# Patient Record
Sex: Male | Born: 1950 | Race: Black or African American | Hispanic: No | Smoking: Never smoker
Health system: Southern US, Community
[De-identification: ages and names within clinical notes are randomized; demographics above are authoritative.]

## PROBLEM LIST (undated history)

## (undated) DIAGNOSIS — I340 Nonrheumatic mitral (valve) insufficiency: Secondary | ICD-10-CM

## (undated) DIAGNOSIS — I219 Acute myocardial infarction, unspecified: Secondary | ICD-10-CM

## (undated) DIAGNOSIS — J302 Other seasonal allergic rhinitis: Secondary | ICD-10-CM

## (undated) DIAGNOSIS — I2510 Atherosclerotic heart disease of native coronary artery without angina pectoris: Secondary | ICD-10-CM

## (undated) DIAGNOSIS — Z87442 Personal history of urinary calculi: Secondary | ICD-10-CM

## (undated) DIAGNOSIS — E119 Type 2 diabetes mellitus without complications: Secondary | ICD-10-CM

## (undated) DIAGNOSIS — N159 Renal tubulo-interstitial disease, unspecified: Secondary | ICD-10-CM

## (undated) DIAGNOSIS — Z992 Dependence on renal dialysis: Secondary | ICD-10-CM

## (undated) DIAGNOSIS — F329 Major depressive disorder, single episode, unspecified: Secondary | ICD-10-CM

## (undated) DIAGNOSIS — F32A Depression, unspecified: Secondary | ICD-10-CM

## (undated) DIAGNOSIS — M1990 Unspecified osteoarthritis, unspecified site: Secondary | ICD-10-CM

## (undated) DIAGNOSIS — I10 Essential (primary) hypertension: Secondary | ICD-10-CM

## (undated) HISTORY — DX: Atherosclerotic heart disease of native coronary artery without angina pectoris: I25.10

## (undated) HISTORY — PX: EYE SURGERY: SHX253

## (undated) HISTORY — PX: COLONOSCOPY W/ POLYPECTOMY: SHX1380

## (undated) HISTORY — PX: SPINE SURGERY: SHX786

## (undated) HISTORY — PX: BACK SURGERY: SHX140

---

## 2004-11-03 ENCOUNTER — Ambulatory Visit (HOSPITAL_COMMUNITY): Admission: RE | Admit: 2004-11-03 | Discharge: 2004-11-04 | Payer: Self-pay | Admitting: Ophthalmology

## 2006-05-31 ENCOUNTER — Ambulatory Visit (HOSPITAL_COMMUNITY): Admission: RE | Admit: 2006-05-31 | Discharge: 2006-06-01 | Payer: Self-pay | Admitting: Ophthalmology

## 2006-06-14 HISTORY — PX: CORONARY ANGIOPLASTY WITH STENT PLACEMENT: SHX49

## 2007-01-15 ENCOUNTER — Emergency Department (HOSPITAL_COMMUNITY): Admission: EM | Admit: 2007-01-15 | Discharge: 2007-01-16 | Payer: Self-pay | Admitting: Emergency Medicine

## 2007-01-16 ENCOUNTER — Emergency Department (HOSPITAL_COMMUNITY): Admission: EM | Admit: 2007-01-16 | Discharge: 2007-01-16 | Payer: Self-pay | Admitting: Emergency Medicine

## 2007-01-18 ENCOUNTER — Inpatient Hospital Stay: Payer: Self-pay | Admitting: Internal Medicine

## 2007-01-20 ENCOUNTER — Ambulatory Visit: Payer: Self-pay | Admitting: Internal Medicine

## 2007-11-04 ENCOUNTER — Emergency Department (HOSPITAL_COMMUNITY): Admission: EM | Admit: 2007-11-04 | Discharge: 2007-11-04 | Payer: Self-pay | Admitting: Emergency Medicine

## 2009-07-13 ENCOUNTER — Emergency Department (HOSPITAL_COMMUNITY): Admission: EM | Admit: 2009-07-13 | Discharge: 2009-07-13 | Payer: Self-pay | Admitting: Emergency Medicine

## 2009-09-02 ENCOUNTER — Ambulatory Visit: Payer: Self-pay | Admitting: Internal Medicine

## 2009-09-02 DIAGNOSIS — E119 Type 2 diabetes mellitus without complications: Secondary | ICD-10-CM | POA: Insufficient documentation

## 2009-09-02 DIAGNOSIS — I2510 Atherosclerotic heart disease of native coronary artery without angina pectoris: Secondary | ICD-10-CM | POA: Insufficient documentation

## 2009-09-02 DIAGNOSIS — R1310 Dysphagia, unspecified: Secondary | ICD-10-CM | POA: Insufficient documentation

## 2009-09-02 DIAGNOSIS — E1121 Type 2 diabetes mellitus with diabetic nephropathy: Secondary | ICD-10-CM | POA: Insufficient documentation

## 2009-09-02 DIAGNOSIS — Z91013 Allergy to seafood: Secondary | ICD-10-CM | POA: Insufficient documentation

## 2009-09-02 DIAGNOSIS — E785 Hyperlipidemia, unspecified: Secondary | ICD-10-CM | POA: Insufficient documentation

## 2009-09-02 DIAGNOSIS — R109 Unspecified abdominal pain: Secondary | ICD-10-CM | POA: Insufficient documentation

## 2009-09-02 DIAGNOSIS — I10 Essential (primary) hypertension: Secondary | ICD-10-CM | POA: Insufficient documentation

## 2009-09-02 DIAGNOSIS — Z87448 Personal history of other diseases of urinary system: Secondary | ICD-10-CM | POA: Insufficient documentation

## 2009-09-02 DIAGNOSIS — K5909 Other constipation: Secondary | ICD-10-CM | POA: Insufficient documentation

## 2009-09-02 DIAGNOSIS — N289 Disorder of kidney and ureter, unspecified: Secondary | ICD-10-CM | POA: Insufficient documentation

## 2009-09-17 ENCOUNTER — Ambulatory Visit (HOSPITAL_COMMUNITY): Admission: RE | Admit: 2009-09-17 | Discharge: 2009-09-17 | Payer: Self-pay | Admitting: Internal Medicine

## 2009-09-17 ENCOUNTER — Ambulatory Visit: Payer: Self-pay | Admitting: Internal Medicine

## 2009-09-19 ENCOUNTER — Encounter: Payer: Self-pay | Admitting: Internal Medicine

## 2010-07-14 NOTE — Letter (Signed)
Summary: Patient Notice, Endo Biopsy Results  [facility]  [address]   [place], [place] [address]   Phone: [phone]  Fax: [phone]       September 19, 2009   [facility] [place], [place]  [address] 08-Jul-1950    Dear [name],  I am pleased to inform you that the biopsies taken during your recent upper endoscopy and colonoscopy did not show any evidence of cancer or inflammation upon pathologic examination.  Additional information/recommendations:  Continue with the treatment plan as outlined on the day of your exam.  You should have a repeat colonoscopy in 7 years.  Please call us if you are having persistent problems or have questions about your condition that have not been fully answered at this time.  Sincerely,    R. Garfield Cornea MD, [facility] Ph: [phone]   Fax: [phone]   Appended Document: Patient Notice, Endo Biopsy Results Letter mailed to pt.

## 2010-07-14 NOTE — Assessment & Plan Note (Signed)
Summary: CONSULT FOR TCS/ABD PAIN/CONSTIPATION/SS   Referring Provider:  Dr Bridget Hartshorn Primary Care Provider:  Dr Bridget Hartshorn  Chief Complaint:  needs tcs, having alot of gas, and bloating and constipation.  History of Present Illness: 60 y/o black male referred for screening colonoscopy, under further questioning he was found to have abd pain & was asked to come in for evaluation prior to procedure.  Pt c/o chronic constipation x 20 yrs.  c/o mid-abd pain intermittant, usually post-prandially, 6/10, lasts until BM.  GB in situ.  BM make abd pain better.  BM 1-2 per week.  Taking fiber supplement with some help.  Denies rectal bleeding or melena.  c/o dark stools x 1 month. Green veggies/beans cause bloating/flatus.  Weight stable.  Appetite ok.  Denies fever or chills,  occ night sweats.  c/o "food gets stuck and won't go down" points to lower esophagus.  Problems w/ liquids and solid foods.  Denies regurgitation.  Denies N/V.   Current Problems (verified): 1)  Dysphagia Unspecified  (ICD-787.20) 2)  Abdominal Pain  (ICD-789.00) 3)  Constipation, Chronic  (ICD-564.09) 4)  Renal Disease, Chronic  (ICD-593.9) 5)  Peyronie's Disease, Hx of  (ICD-V13.09) 6)  Hyperlipidemia  (ICD-272.4) 7)  Cad  (ICD-414.00) 8)  Hypertension  (ICD-401.9) 9)  Iddm  (ICD-250.01) 10)  Shellfish Allergy  (ICD-V15.04)  Current Medications (verified): 1)  Lantus 100 Unit/ml Soln (Insulin Glargine) .... 65 Units Q Am 2)  Glucotrol 10 Mg Tabs (Glipizide) .... Two Times A Day 3)  Clonidine Hcl 0.1 Mg Tabs (Clonidine Hcl) .... Two Times A Day 4)  Toprol Xl 100 Mg Xr24h-Tab (Metoprolol Succinate) .... Two Times A Day 5)  Lipitor 40 Mg Tabs (Atorvastatin Calcium) .... At Bedtime 6)  Accuretic 20-12.5 Mg Tabs (Quinapril-Hydrochlorothiazide) .... Two Times A Day  Allergies (verified): 1)  * Shellfish  Past History:  Past Medical History: DYSPHAGIA UNSPECIFIED (ICD-787.20) ABDOMINAL PAIN (ICD-789.00) CONSTIPATION,  CHRONIC (ICD-564.09) RENAL DISEASE, CHRONIC (ICD-593.9) PEYRONIE'S DISEASE, HX OF (ICD-V13.09) HYPERLIPIDEMIA (ICD-272.4) CAD (ICD-414.00) HYPERTENSION (ICD-401.9) IDDM (ICD-250.01) SHELLFISH ALLERGY (ICD-V15.04)  Past Surgical History: eye surgery hemorrhage low back surgery  Family History: No known family history of colorectal carcinoma, IBD, liver or chronic GI problems. Father: (?) Mother:(72) DM Siblings: 4 brothers-healthy  Social History: married 1 son grown healthy disabled Patient has never smoked.  chew tobacco x 40 yrs, quit 1 mo ago Alcohol Use - none since 10 yrs ago Illicit Drug Use - no Patient does not get regular exercise.  Smoking Status:  never Drug Use:  no Does Patient Exercise:  no  Review of Systems General:  Complains of weakness; denies fever, chills, sweats, anorexia, fatigue, malaise, weight loss, and sleep disorder. CV:  Denies chest pains, angina, palpitations, syncope, dyspnea on exertion, orthopnea, PND, peripheral edema, and claudication. Resp:  Denies dyspnea at rest, dyspnea with exercise, cough, sputum, wheezing, coughing up blood, and pleurisy. GI:  Denies jaundice and fecal incontinence. GU:  Denies urinary burning, blood in urine, urinary frequency, urinary hesitancy, nocturnal urination, and urinary incontinence. Derm:  Denies rash, itching, dry skin, hives, moles, warts, and unhealing ulcers. Psych:  Denies depression, anxiety, memory loss, suicidal ideation, hallucinations, paranoia, phobia, and confusion. Heme:  Complains of bruising; denies bleeding and enlarged lymph nodes.  Vital Signs:  Patient profile:   60 year old male Height:      77 inches Weight:      213 pounds BMI:     25.35 Temp:     98.0 degrees  F oral Pulse rate:   80 / minute BP sitting:   140 / 98  (left arm) Cuff size:   regular  Vitals Entered By: Burnadette Peter LPN (March 22, 624THL 624THL AM)  Physical Exam  General:  Well developed, well nourished, no  acute distress. Head:  Normocephalic and atraumatic. Eyes:  Sclera clear, no icterus. Ears:  Normal auditory acuity. Nose:  No deformity, discharge,  or lesions. Mouth:  poor dentition.   Neck:  Supple; no masses or thyromegaly. Lungs:  Clear throughout to auscultation. Heart:  Regular rate and rhythm; no murmurs, rubs,  or bruits. Abdomen:  Soft, nontender and nondistended. No masses, hepatosplenomegaly or hernias noted. Normal bowel sounds.without guarding and without rebound.   Rectal:  deferred until time of colonoscopy.   Msk:  Symmetrical with no gross deformities. Normal posture. Pulses:  Normal pulses noted. Extremities:  No  cyanosis, edema or deformities noted. POsitive for clubbing. Neurologic:  Alert and  oriented x4;  grossly normal neurologically. Skin:  Intact without significant lesions or rashes. Cervical Nodes:  No significant cervical adenopathy. Psych:  Alert and cooperative. Normal mood and affect.  Impression & Recommendations:  Problem # 1:  CONSTIPATION, CHRONIC (ICD-564.09)  60 y/o black male w/ chronic constipation, unsatisfied w/ BM 1-2 weekly.  Will add miralax to fiber supplement.  Will need screening colonoscopy.  I suspect abdominal pain (generalized) is secondary to constipation since it is relieved w/ defecation.  Colonoscopy and EGD with possible esophageal dilatation to be performed by Dr. Bridgette Habermann in the near future.  I have discussed risks and benefits which include, but are not limited to, bleeding, infection, perforation, or medication reaction.  The patient agrees with this plan and consent will be obtained.  Orders: Consultation Level IV [id number])  Problem # 2:  ABDOMINAL PAIN (ICD-789.00)  See #1  Orders: Consultation Level IV [id number])  Problem # 3:  DYSPHAGIA UNSPECIFIED (ICD-787.20) Chronic intermittant dysphagia to solids/liquids.  ? esophageal web, ring, stricture, less likely malignancy.    Orders: Consultation Level IV  [id number])  Problem # 4:  IDDM (ICD-250.01) Assessment: Comment Only Insulin adjustments made  Patient Instructions: 1)  Take 32 units LANTUS the morning before the procedure 2)  check your blood sugars frequently 3)  Call if any problems 4)  Hold insulin morning of procedure, bring to hospital for early morning procedure.  They will give you instructions there. 5)  Begin miralax as directed for contipation 6)  Constipation literature 7)  The medication list was reviewed and reconciled.  All changed / newly prescribed medications were explained.  A complete medication list was provided to the patient / caregiver.

## 2010-07-14 NOTE — Letter (Signed)
Summary: tcs/egd/ed order  tcs/egd/ed order   Imported By: Sofie Rower 09/02/2009 12:54:02  _____________________________________________________________________  External Attachment:    Type:   Image     Comment:   External Document

## 2010-08-30 LAB — URINE MICROSCOPIC-ADD ON

## 2010-08-30 LAB — BASIC METABOLIC PANEL
BUN: 37 mg/dL — ABNORMAL HIGH (ref 6–23)
CO2: 26 mEq/L (ref 19–32)
Calcium: 8.2 mg/dL — ABNORMAL LOW (ref 8.4–10.5)
Chloride: 97 mEq/L (ref 96–112)
Creatinine, Ser: 2.15 mg/dL — ABNORMAL HIGH (ref 0.4–1.5)
GFR calc Af Amer: 39 mL/min — ABNORMAL LOW (ref >60)
GFR calc non Af Amer: 32 mL/min — ABNORMAL LOW (ref >60)
Glucose, Bld: 208 mg/dL — ABNORMAL HIGH (ref 70–99)
Potassium: 4.1 mEq/L (ref 3.5–5.1)
Sodium: 135 mEq/L (ref 135–145)

## 2010-08-30 LAB — URINALYSIS, ROUTINE W REFLEX MICROSCOPIC
Bilirubin Urine: NEGATIVE
Glucose, UA: 100 mg/dL — AB
Ketones, ur: NEGATIVE mg/dL
Leukocytes, UA: NEGATIVE
Nitrite: NEGATIVE
Protein, ur: >300 mg/dL — AB
Specific Gravity, Urine: 1.02 (ref 1.005–1.030)
Urobilinogen, UA: 0.2 mg/dL (ref 0.0–1.0)
pH: 6 (ref 5.0–8.0)

## 2010-08-30 LAB — HEPATIC FUNCTION PANEL
ALT: 27 U/L (ref 0–53)
AST: 26 U/L (ref 0–37)
Albumin: 2.5 g/dL — ABNORMAL LOW (ref 3.5–5.2)
Alkaline Phosphatase: 87 U/L (ref 39–117)
Bilirubin, Direct: <0.1 mg/dL (ref 0.0–0.3)
Total Bilirubin: 0.3 mg/dL (ref 0.3–1.2)
Total Protein: 6.3 g/dL (ref 6.0–8.3)

## 2010-08-30 LAB — LIPASE, BLOOD: Lipase: 30 U/L (ref 11–59)

## 2010-10-30 NOTE — Op Note (Signed)
NAMEGRAHAME, PEREZMARTINEZ             ACCOUNT NO.:  [id number]   MEDICAL RECORD NO.:  [id number]          PATIENT TYPE:  OIB   LOCATION:  [id number]                         FACILITY:  [facility]   PHYSICIAN:  John D. Zigmund Daniel, M.D. 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   PROCEDURES:  Pars plana vitrectomy with membrane peel, left eye; retinal  photocoagulation, left eye; 25-gauge system, left eye.   SURGEON:  Tempie Hoist, M.D.   ASSISTANT:  Deatra Ina, M.A.   ANESTHESIA:  Is general.   DETAILS:  Usual prep and drape.  Peritomies at 10, 2 and 4 o'clock.  Trocars placed at 10, 2 and 4 o'clock.  Infusion at 4 o'clock.  Provisc  placed on the corneal surface.  Biome viewing system moved into place.  Pars plana vitrectomy was begun just behind the cataractous lens.  Blood  and vitreous were encountered.  This was carefully removed under low  suction and rapid cutting.  A large neovascular frond was seen on the  disk extending nasally.  This was lifted from its attachments to the  retinal surface and trimmed down to the stump at the disk.  Additional  posterior hyaloid was peeled and lifted free from its attachment to the  retinal surface.  The macular region was peeled.  The vitrectomy was  carried out into the periphery where scleral depression was used, and  the blood was removed from the vitreous base for 360 degrees.  Once this  was accomplished, the endolaser was positioned in the eye, and 838 burns  placed around the retinal periphery with a power of 1000 milliwatts,  1000 microns each and 0.1 seconds each.  A washout procedure was  performed.  The instruments were removed from the eye.  Trocars were  removed from the eye.  The wounds were held with Q-tip until  tight.  Polymyxin and gentamicin were irrigated in the tenon space.  Decadron 10  mg was injected into the lower subconjunctival space.  Marcaine was  injected around the globe for postoperative pain.  TobraDex, ophthalmic  ointment, and a patch and shield were placed.  The closing pressure was  15 with a Baer keratometer.  Complications none.  Duration 1/2 hour.  The patient was awakened and taken to recovery in satisfactory  condition.      Chrystie Nose. Zigmund Daniel, M.D.  Electronically Signed     JDM/MEDQ  D:  05/31/2006  T:  06/01/2006  Job:  [id number]

## 2010-10-30 NOTE — Op Note (Signed)
NAMESAVIYON, [name]             ACCOUNT NO.:  [id number]   MEDICAL RECORD NO.:  [id number]          PATIENT TYPE:  OIB   LOCATION:  2550                         FACILITY:  [facility]   PHYSICIAN:  Chrystie Nose. Zigmund Daniel, M.D. DATE OF BIRTH:  [birth year]   DATE OF PROCEDURE:  11/03/2004  DATE OF DISCHARGE:                                 OPERATIVE REPORT   ADMISSION DIAGNOSIS:  Dense vitreous hemorrhage, proliferative diabetic  retinopathy and posterior membranes, right eye.   PROCEDURES:  Pars plana vitrectomy, membrane peel, retinal photocoagulation  in the right eye.   SURGEON:  Tempie Hoist, M.D.   ASSISTANT:  Thurman Coyer. Lundquist, P.A.   ANESTHESIA:  General.   DETAILS:  Usual prep and drape, peritomies at 8, 10 and 2 o'clock, a 4-mm  infusion at 8 o'clock, Provisc placed on the corneal surface.  The  vitrectomy cutter and light pipe were placed at 2 and 10 o'clock,  respectively.  The pars plana vitrectomy was begun just behind the  crystalline lens where large clots of white material were encountered.  The  BIOM viewing system was moved into place.  After piercing through the white  membranes, the dark red blood became apparent.  There were thick layers of  dark red blood lying on the retinal surface and swirling back and forth.  A  washout procedure, washing more and more blood out continually with the  continuous suction and infusion, was carried out until the entire vitreous  cavity was cleaned.  The vitrectomy was carried down to the vitreous base  for 360 degrees.  Scleral depression was needed to access this area and the  vitreous was trimmed down to the vitreous base.  All blood was removed.  A  posterior membrane was extended from the disk nasally; this was trimmed with  vitreous scissors and then the plug was pulled with the vitreous forceps.  Minimal bleeding occurred.  The endolaser was positioned in the eyes, 725  burns placed around the retinal periphery with a power  1000 milliwatts, 1000  microns each and 0.1 seconds each.  Again, a washout procedure was  performed.  Intravitreal Kenalog injection. 0.1 mL, was then injected into  the vitreous cavity.  The instruments were removed from the eye and 9-0  nylon was used to close the sclerotomy sites; they were tested and found to  be tight.  The conjunctiva was closed with wet-field cautery.  Polymyxin and  gentamicin were irrigated into Tenon's space, atropine solution was applied,  Decadron 10 mg was injected to the lower subconjunctival space, Marcaine  solution was injected around the globe for postop pain.  Closing pressure  was 10 with a Barraquer keratometer.  TobraDex ophthalmic ointment, a patch  and shield were placed.  The patient was awakened and taken to recovery in  satisfactory condition.   COMPLICATIONS:  None.   DURATION:  One hour.      JDM/MEDQ  D:  11/03/2004  T:  11/04/2004  Job:  [id number]

## 2011-03-10 LAB — CBC
HCT: 37 — ABNORMAL LOW
Hemoglobin: 13.3
MCHC: 36
MCV: 87.2
Platelets: 191
RBC: 4.24
RDW: 13.4
WBC: 7.1

## 2011-03-10 LAB — DIFFERENTIAL
Basophils Absolute: 0
Basophils Relative: 0
Eosinophils Absolute: 0.2
Eosinophils Relative: 3
Lymphocytes Relative: 32
Lymphs Abs: 2.3
Monocytes Absolute: 0.5
Monocytes Relative: 6
Neutro Abs: 4.1
Neutrophils Relative %: 58

## 2011-03-10 LAB — BASIC METABOLIC PANEL
BUN: 26 — ABNORMAL HIGH
CO2: 24
Calcium: 8.9
Chloride: 107
Creatinine, Ser: 1.92 — ABNORMAL HIGH
GFR calc Af Amer: 44 — ABNORMAL LOW
GFR calc non Af Amer: 36 — ABNORMAL LOW
Glucose, Bld: 110 — ABNORMAL HIGH
Potassium: 3.6
Sodium: 137

## 2011-03-10 LAB — POCT CARDIAC MARKERS
CKMB, poc: 11.9
CKMB, poc: 8.6
Myoglobin, poc: 262
Myoglobin, poc: 414
Operator id: 179121
Operator id: 179121
Troponin i, poc: <0.05
Troponin i, poc: <0.05

## 2011-03-10 LAB — CK TOTAL AND CKMB (NOT AT ARMC)
CK, MB: 20.1 — ABNORMAL HIGH
Relative Index: 2.1
Total CK: 950 — ABNORMAL HIGH

## 2011-03-10 LAB — TROPONIN I: Troponin I: 0.02

## 2011-03-10 LAB — B-NATRIURETIC PEPTIDE (CONVERTED LAB): Pro B Natriuretic peptide (BNP): <30

## 2011-03-29 LAB — BASIC METABOLIC PANEL
BUN: 22
CO2: 27
Calcium: 9
Chloride: 106
Creatinine, Ser: 1.66 — ABNORMAL HIGH
GFR calc Af Amer: 52 — ABNORMAL LOW
GFR calc non Af Amer: 43 — ABNORMAL LOW
Glucose, Bld: 174 — ABNORMAL HIGH
Potassium: 4.1
Sodium: 138

## 2011-03-29 LAB — CBC
HCT: 39.8
Hemoglobin: 13.9
MCHC: 35
MCV: 85.5
Platelets: 264
RBC: 4.65
RDW: 13.9
WBC: 7.8

## 2011-03-29 LAB — DIFFERENTIAL
Basophils Absolute: 0.3 — ABNORMAL HIGH
Basophils Relative: 4 — ABNORMAL HIGH
Eosinophils Absolute: 0.3
Eosinophils Relative: 4
Lymphocytes Relative: 33
Lymphs Abs: 2.5
Monocytes Absolute: 0.6
Monocytes Relative: 8
Neutro Abs: 4
Neutrophils Relative %: 52

## 2011-03-29 LAB — CK TOTAL AND CKMB (NOT AT ARMC)
CK, MB: 23.9 — ABNORMAL HIGH
Relative Index: 3.5 — ABNORMAL HIGH
Total CK: 689 — ABNORMAL HIGH

## 2014-01-14 ENCOUNTER — Emergency Department (HOSPITAL_COMMUNITY): Payer: Medicare Other

## 2014-01-14 ENCOUNTER — Inpatient Hospital Stay (HOSPITAL_COMMUNITY)
Admission: EM | Admit: 2014-01-14 | Discharge: 2014-01-17 | DRG: 683 | Disposition: A | Discharge status: Home / Self Care | Payer: Medicare Other | Attending: Family Medicine | Admitting: Family Medicine

## 2014-01-14 ENCOUNTER — Encounter (HOSPITAL_COMMUNITY): Payer: Self-pay | Admitting: Emergency Medicine

## 2014-01-14 DIAGNOSIS — E8809 Other disorders of plasma-protein metabolism, not elsewhere classified: Secondary | ICD-10-CM | POA: Diagnosis present

## 2014-01-14 DIAGNOSIS — E11319 Type 2 diabetes mellitus with unspecified diabetic retinopathy without macular edema: Secondary | ICD-10-CM | POA: Diagnosis present

## 2014-01-14 DIAGNOSIS — E785 Hyperlipidemia, unspecified: Secondary | ICD-10-CM | POA: Diagnosis present

## 2014-01-14 DIAGNOSIS — E1129 Type 2 diabetes mellitus with other diabetic kidney complication: Secondary | ICD-10-CM | POA: Diagnosis present

## 2014-01-14 DIAGNOSIS — E1121 Type 2 diabetes mellitus with diabetic nephropathy: Secondary | ICD-10-CM | POA: Diagnosis present

## 2014-01-14 DIAGNOSIS — Z87891 Personal history of nicotine dependence: Secondary | ICD-10-CM | POA: Diagnosis not present

## 2014-01-14 DIAGNOSIS — N281 Cyst of kidney, acquired: Secondary | ICD-10-CM | POA: Diagnosis present

## 2014-01-14 DIAGNOSIS — D638 Anemia in other chronic diseases classified elsewhere: Secondary | ICD-10-CM | POA: Diagnosis present

## 2014-01-14 DIAGNOSIS — I2510 Atherosclerotic heart disease of native coronary artery without angina pectoris: Secondary | ICD-10-CM | POA: Diagnosis present

## 2014-01-14 DIAGNOSIS — E119 Type 2 diabetes mellitus without complications: Secondary | ICD-10-CM | POA: Diagnosis present

## 2014-01-14 DIAGNOSIS — Z794 Long term (current) use of insulin: Secondary | ICD-10-CM | POA: Diagnosis not present

## 2014-01-14 DIAGNOSIS — E875 Hyperkalemia: Secondary | ICD-10-CM | POA: Diagnosis present

## 2014-01-14 DIAGNOSIS — N058 Unspecified nephritic syndrome with other morphologic changes: Secondary | ICD-10-CM | POA: Diagnosis present

## 2014-01-14 DIAGNOSIS — E871 Hypo-osmolality and hyponatremia: Secondary | ICD-10-CM | POA: Diagnosis present

## 2014-01-14 DIAGNOSIS — N179 Acute kidney failure, unspecified: Principal | ICD-10-CM | POA: Diagnosis present

## 2014-01-14 DIAGNOSIS — Z23 Encounter for immunization: Secondary | ICD-10-CM

## 2014-01-14 DIAGNOSIS — R05 Cough: Secondary | ICD-10-CM

## 2014-01-14 DIAGNOSIS — I129 Hypertensive chronic kidney disease with stage 1 through stage 4 chronic kidney disease, or unspecified chronic kidney disease: Secondary | ICD-10-CM | POA: Diagnosis present

## 2014-01-14 DIAGNOSIS — N189 Chronic kidney disease, unspecified: Secondary | ICD-10-CM | POA: Diagnosis present

## 2014-01-14 DIAGNOSIS — E1139 Type 2 diabetes mellitus with other diabetic ophthalmic complication: Secondary | ICD-10-CM | POA: Diagnosis present

## 2014-01-14 DIAGNOSIS — I10 Essential (primary) hypertension: Secondary | ICD-10-CM | POA: Diagnosis present

## 2014-01-14 DIAGNOSIS — R059 Cough, unspecified: Secondary | ICD-10-CM

## 2014-01-14 DIAGNOSIS — Z833 Family history of diabetes mellitus: Secondary | ICD-10-CM | POA: Diagnosis not present

## 2014-01-14 DIAGNOSIS — Z9861 Coronary angioplasty status: Secondary | ICD-10-CM

## 2014-01-14 DIAGNOSIS — D631 Anemia in chronic kidney disease: Secondary | ICD-10-CM | POA: Diagnosis present

## 2014-01-14 DIAGNOSIS — R509 Fever, unspecified: Secondary | ICD-10-CM | POA: Diagnosis present

## 2014-01-14 DIAGNOSIS — E109 Type 1 diabetes mellitus without complications: Secondary | ICD-10-CM

## 2014-01-14 HISTORY — DX: Nonrheumatic mitral (valve) insufficiency: I34.0

## 2014-01-14 HISTORY — DX: Renal tubulo-interstitial disease, unspecified: N15.9

## 2014-01-14 HISTORY — DX: Essential (primary) hypertension: I10

## 2014-01-14 HISTORY — DX: Type 2 diabetes mellitus without complications: E11.9

## 2014-01-14 LAB — CBC WITH DIFFERENTIAL/PLATELET
Basophils Absolute: 0 10*3/uL (ref 0.0–0.1)
Basophils Relative: 0 % (ref 0–1)
Eosinophils Absolute: 0.4 10*3/uL (ref 0.0–0.7)
Eosinophils Relative: 5 % (ref 0–5)
HCT: 32.5 % — ABNORMAL LOW (ref 39.0–52.0)
Hemoglobin: 11.4 g/dL — ABNORMAL LOW (ref 13.0–17.0)
Lymphocytes Relative: 17 % (ref 12–46)
Lymphs Abs: 1.3 10*3/uL (ref 0.7–4.0)
MCH: 30.4 pg (ref 26.0–34.0)
MCHC: 35.1 g/dL (ref 30.0–36.0)
MCV: 86.7 fL (ref 78.0–100.0)
Monocytes Absolute: 0.4 10*3/uL (ref 0.1–1.0)
Monocytes Relative: 5 % (ref 3–12)
Neutro Abs: 5.8 10*3/uL (ref 1.7–7.7)
Neutrophils Relative %: 73 % (ref 43–77)
Platelets: 277 10*3/uL (ref 150–400)
RBC: 3.75 MIL/uL — ABNORMAL LOW (ref 4.22–5.81)
RDW: 13 % (ref 11.5–15.5)
WBC: 7.9 10*3/uL (ref 4.0–10.5)

## 2014-01-14 LAB — BASIC METABOLIC PANEL
Anion gap: 10 (ref 5–15)
BUN: 67 mg/dL — ABNORMAL HIGH (ref 6–23)
CO2: 24 mEq/L (ref 19–32)
Calcium: 8.5 mg/dL (ref 8.4–10.5)
Chloride: 96 mEq/L (ref 96–112)
Creatinine, Ser: 6.24 mg/dL — ABNORMAL HIGH (ref 0.50–1.35)
GFR calc Af Amer: 10 mL/min — ABNORMAL LOW (ref >90)
GFR calc non Af Amer: 9 mL/min — ABNORMAL LOW (ref >90)
Glucose, Bld: 236 mg/dL — ABNORMAL HIGH (ref 70–99)
Potassium: 5.6 mEq/L — ABNORMAL HIGH (ref 3.7–5.3)
Sodium: 130 mEq/L — ABNORMAL LOW (ref 137–147)

## 2014-01-14 LAB — PROTEIN / CREATININE RATIO, URINE
Creatinine, Urine: 67.21 mg/dL
Protein Creatinine Ratio: 6.14 — ABNORMAL HIGH (ref 0.00–0.15)
Total Protein, Urine: 413 mg/dL

## 2014-01-14 LAB — CREATININE, URINE, RANDOM: Creatinine, Urine: 69.24 mg/dL

## 2014-01-14 LAB — PHOSPHORUS: Phosphorus: 4.1 mg/dL (ref 2.3–4.6)

## 2014-01-14 LAB — SODIUM, URINE, RANDOM: Sodium, Ur: 48 mEq/L

## 2014-01-14 LAB — MAGNESIUM: Magnesium: 3.5 mg/dL — ABNORMAL HIGH (ref 1.5–2.5)

## 2014-01-14 LAB — GLUCOSE, CAPILLARY: Glucose-Capillary: 77 mg/dL (ref 70–99)

## 2014-01-14 MED ORDER — FLUTICASONE PROPIONATE 50 MCG/ACT NA SUSP
1.0000 | Freq: Every day | NASAL | Status: DC
  Administered 2014-01-14 – 2014-01-17 (×4): 1 via NASAL
  Filled 2014-01-14 (×3): qty 16

## 2014-01-14 MED ORDER — HEPARIN SODIUM (PORCINE) 5000 UNIT/ML IJ SOLN
5000.0000 [IU] | Freq: Three times a day (TID) | INTRAMUSCULAR | Status: DC
Start: 2014-01-14 — End: 2014-01-14

## 2014-01-14 MED ORDER — CLONIDINE HCL 0.2 MG PO TABS
0.2000 mg | ORAL_TABLET | Freq: Three times a day (TID) | ORAL | Status: DC
  Administered 2014-01-14 – 2014-01-17 (×8): 0.2 mg via ORAL
  Filled 2014-01-14 (×8): qty 1

## 2014-01-14 MED ORDER — INSULIN GLARGINE 100 UNIT/ML ~~LOC~~ SOLN
60.0000 [IU] | Freq: Every day | SUBCUTANEOUS | Status: DC
  Administered 2014-01-15: 60 [IU] via SUBCUTANEOUS
  Filled 2014-01-14 (×3): qty 0.6

## 2014-01-14 MED ORDER — SODIUM CHLORIDE 0.9 % IJ SOLN
3.0000 mL | Freq: Two times a day (BID) | INTRAMUSCULAR | Status: DC
  Administered 2014-01-14 – 2014-01-16 (×5): 3 mL via INTRAVENOUS

## 2014-01-14 MED ORDER — INSULIN ASPART 100 UNIT/ML ~~LOC~~ SOLN
0.0000 [IU] | Freq: Three times a day (TID) | SUBCUTANEOUS | Status: DC
  Administered 2014-01-15 (×2): 2 [IU] via SUBCUTANEOUS
  Administered 2014-01-16: 3 [IU] via SUBCUTANEOUS
  Administered 2014-01-16: 2 [IU] via SUBCUTANEOUS

## 2014-01-14 MED ORDER — FENOFIBRATE 160 MG PO TABS
160.0000 mg | ORAL_TABLET | Freq: Every day | ORAL | Status: DC
  Administered 2014-01-15 – 2014-01-17 (×3): 160 mg via ORAL
  Filled 2014-01-14 (×4): qty 1

## 2014-01-14 MED ORDER — ONDANSETRON HCL 4 MG PO TABS: 4.0000 mg | ORAL_TABLET | Freq: Four times a day (QID) | ORAL | Status: DC | PRN

## 2014-01-14 MED ORDER — SODIUM CHLORIDE 0.9 % IV SOLN: INTRAVENOUS | Status: DC

## 2014-01-14 MED ORDER — INSULIN GLARGINE 100 UNIT/ML ~~LOC~~ SOLN
120.0000 [IU] | Freq: Every day | SUBCUTANEOUS | Status: DC
Start: 2014-01-14 — End: 2014-01-14

## 2014-01-14 MED ORDER — CROMOLYN SODIUM 5.2 MG/ACT NA AERS
1.0000 | INHALATION_SPRAY | Freq: Three times a day (TID) | NASAL | Status: DC
  Filled 2014-01-14: qty 13

## 2014-01-14 MED ORDER — HYDRALAZINE HCL 20 MG/ML IJ SOLN: 10.0000 mg | Freq: Four times a day (QID) | INTRAMUSCULAR | Status: DC | PRN

## 2014-01-14 MED ORDER — SODIUM CHLORIDE 0.9 % IV SOLN
INTRAVENOUS | Status: DC
  Administered 2014-01-14 – 2014-01-16 (×5): via INTRAVENOUS

## 2014-01-14 MED ORDER — ONDANSETRON HCL 4 MG/2ML IJ SOLN: 4.0000 mg | Freq: Four times a day (QID) | INTRAMUSCULAR | Status: DC | PRN

## 2014-01-14 MED ORDER — LOSARTAN POTASSIUM 50 MG PO TABS: 100.0000 mg | ORAL_TABLET | Freq: Every day | ORAL | Status: DC

## 2014-01-14 MED ORDER — ACETAMINOPHEN 325 MG PO TABS: 650.0000 mg | ORAL_TABLET | Freq: Four times a day (QID) | ORAL | Status: DC | PRN

## 2014-01-14 MED ORDER — BENZONATATE 100 MG PO CAPS
200.0000 mg | ORAL_CAPSULE | Freq: Once | ORAL | Status: AC
  Administered 2014-01-14: 200 mg via ORAL
  Filled 2014-01-14: qty 2

## 2014-01-14 MED ORDER — CLONIDINE HCL 0.1 MG PO TABS
0.1000 mg | ORAL_TABLET | Freq: Three times a day (TID) | ORAL | Status: DC
  Administered 2014-01-14: 0.1 mg via ORAL
  Filled 2014-01-14: qty 1

## 2014-01-14 MED ORDER — INSULIN ASPART 100 UNIT/ML ~~LOC~~ SOLN: 0.0000 [IU] | Freq: Every day | SUBCUTANEOUS | Status: DC

## 2014-01-14 MED ORDER — ASPIRIN 325 MG PO TABS: 325.0000 mg | ORAL_TABLET | Freq: Every day | ORAL | Status: DC

## 2014-01-14 MED ORDER — FUROSEMIDE 10 MG/ML IJ SOLN
80.0000 mg | Freq: Two times a day (BID) | INTRAMUSCULAR | Status: DC
  Administered 2014-01-14 – 2014-01-15 (×2): 80 mg via INTRAVENOUS
  Filled 2014-01-14 (×2): qty 8

## 2014-01-14 MED ORDER — METOPROLOL SUCCINATE ER 50 MG PO TB24
100.0000 mg | ORAL_TABLET | Freq: Two times a day (BID) | ORAL | Status: DC
  Administered 2014-01-14 – 2014-01-17 (×6): 100 mg via ORAL
  Filled 2014-01-14 (×6): qty 2

## 2014-01-14 MED ORDER — AMLODIPINE BESYLATE 5 MG PO TABS
10.0000 mg | ORAL_TABLET | Freq: Every day | ORAL | Status: DC
  Administered 2014-01-15 – 2014-01-17 (×3): 10 mg via ORAL
  Filled 2014-01-14 (×3): qty 2

## 2014-01-14 MED ORDER — ACETAMINOPHEN 650 MG RE SUPP: 650.0000 mg | Freq: Four times a day (QID) | RECTAL | Status: DC | PRN

## 2014-01-14 NOTE — ED Provider Notes (Signed)
CSN: [id number]     Arrival date & time 01/14/14  1055 History   First MD Initiated Contact with Patient 01/14/14 1136    This chart was scribed for Robbie Lis, MD by Terressa Koyanagi, ED Scribe. This patient was seen in room A340/A340-01 and the patient's care was started at 11:38 AM.  Chief Complaint  Patient presents with  . Cough  . Fever  PCP: CLAGGETT,ELIN, PA-C  HPI HPI Comments: Md Gory is a 63 y.o. male, with a Hx of HTN, DM, kidney infection and MI who presents to the Emergency Department complaining of intermittent cough with associated sneezing, fever, chills, diaphoresis, nasal congestion, emesis (one epsiode), diarrhea, and sore throat onset 3 weeks ago. Patient states "I think I have the flu." No documented fevers at home.  Has not taken anything for his symptoms. Pt denies SOB, chest pain, sick contacts.   Patient noted to be hypertensive. Past Medical History  Diagnosis Date  . Hypertension   . Diabetes mellitus without complication   . Kidney infection   . MI (mitral incompetence)    Past Surgical History  Procedure Laterality Date  . Back surgery    . Coronary angioplasty with stent placement     Family History  Problem Relation Age of Onset  . Diabetes Other    History  Substance Use Topics  . Smoking status: Never Smoker   . Smokeless tobacco: Former Systems developer    Types: [place] date: 06/14/2010  . Alcohol Use: No    Review of Systems  Constitutional: Positive for chills. Negative for fever.  HENT: Positive for congestion.   Respiratory: Positive for cough and shortness of breath. Negative for chest tightness.   Cardiovascular: Negative.  Negative for chest pain.  Gastrointestinal: Positive for vomiting. Negative for nausea and abdominal pain.  Genitourinary: Negative.  Negative for dysuria.  Musculoskeletal: Negative for back pain.  Skin: Negative for rash.  Neurological: Negative for headaches.  All other systems reviewed and are  negative.     Allergies  Review of patient's allergies indicates no known allergies.  Home Medications   Prior to Admission medications   Medication Sig Start Date End Date Taking? Authorizing Provider  amLODipine (NORVASC) 10 MG tablet Take 1 tablet by mouth daily. 01/13/14  Yes Historical Provider, MD  aspirin 325 MG tablet Take 325 mg by mouth daily.   Yes Historical Provider, MD  aspirin-sod bicarb-citric acid (ALKA-SELTZER) 325 MG TBEF tablet Take 325-650 mg by mouth daily as needed (head cold).   Yes Historical Provider, MD  cloNIDine (CATAPRES) 0.1 MG tablet Take 1 tablet by mouth 3 (three) times daily. 11/07/13  Yes Historical Provider, MD  Cromolyn Sodium (NASAL ALLERGY NA) Place 1 spray into both nostrils at bedtime.   Yes Historical Provider, MD  fenofibrate (TRICOR) 145 MG tablet Take 145 mg by mouth daily.   Yes Historical Provider, MD  hydrochlorothiazide (HYDRODIURIL) 25 MG tablet Take 1 tablet by mouth daily. 12/12/13  Yes Historical Provider, MD  LANTUS 100 UNIT/ML injection Inject 120 Units into the skin daily. 60 units on each side of the stomach once daily. 11/28/13  Yes Historical Provider, MD  losartan (COZAAR) 100 MG tablet Take 1 tablet by mouth daily. 12/12/13  Yes Historical Provider, MD  metoprolol succinate (TOPROL-XL) 100 MG 24 hr tablet Take 1 tablet by mouth 2 (two) times daily. 12/12/13  Yes Historical Provider, MD   Triage Vitals: BP 204/102  Pulse 83  Temp(Src) 98.6  F (37 C) (Oral)  Resp 18  Ht 6\' 5"  (1.956 m)  Wt 214 lb (97.07 kg)  BMI 25.37 kg/m2  SpO2 99% Physical Exam  Nursing note and vitals reviewed. Constitutional: He is oriented to person, place, and time. No distress.  HENT:  Head: Normocephalic and atraumatic.  Mouth/Throat: Oropharynx is clear and moist.  Eyes: Pupils are equal, round, and reactive to light.  Neck: Neck supple.  Cardiovascular: Normal rate, regular rhythm and normal heart sounds.   No murmur heard. Pulmonary/Chest: Effort  normal and breath sounds normal. No respiratory distress. He has no wheezes.  Coughing on exam  Abdominal: Soft. Bowel sounds are normal. There is no tenderness. There is no rebound and no guarding.  Musculoskeletal: He exhibits no edema.  Lymphadenopathy:    He has no cervical adenopathy.  Neurological: He is alert and oriented to person, place, and time.  Skin: Skin is warm and dry.  Psychiatric: He has a normal mood and affect.    ED Course  Procedures (including critical care time) DIAGNOSTIC STUDIES: Oxygen Saturation is 99% on RA, nl by my interpretation.    COORDINATION OF CARE:  11:40 AM-Discussed treatment plan which includes imaging with pt at bedside and pt agreed to plan.   Labs Review Labs Reviewed  CBC WITH DIFFERENTIAL - Abnormal; Notable for the following:    RBC 3.75 (*)    Hemoglobin 11.4 (*)    HCT 32.5 (*)    All other components within normal limits  BASIC METABOLIC PANEL - Abnormal; Notable for the following:    Sodium 130 (*)    Potassium 5.6 (*)    Glucose, Bld 236 (*)    BUN 67 (*)    Creatinine, Ser 6.24 (*)    GFR calc non Af Amer 9 (*)    GFR calc Af Amer 10 (*)    All other components within normal limits  CREATININE, URINE, RANDOM  SODIUM, URINE, RANDOM  MAGNESIUM  PHOSPHORUS    Imaging Review Dg Chest 2 View  01/14/2014   CLINICAL DATA:  Flu-like symptoms  EXAM: CHEST  2 VIEW  COMPARISON:  Prior chest x-ray 07/13/2009  FINDINGS: The lungs are clear and negative for focal airspace consolidation, pulmonary edema or suspicious pulmonary nodule. No pleural effusion or pneumothorax. Cardiac and mediastinal contours are within normal limits. Metallic coronary stent projects over the LAD. No acute fracture or lytic or blastic osseous lesions. The visualized upper abdominal bowel gas pattern is unremarkable.  IMPRESSION: No active cardiopulmonary disease.   Electronically Signed   By: Jacqulynn Cadet M.D.   On: 01/14/2014 12:48   US  Renal  01/14/2014   CLINICAL DATA:  Hypertension, flank pain  EXAM: RENAL/URINARY TRACT ULTRASOUND COMPLETE  COMPARISON:  None.  FINDINGS: Right Kidney:  Length: 10.9 cm. Slightly increased echotexture throughout the right kidney. 3.9 cm minimally complex cyst in the upper pole with thin internal septation. 2.2 cm midpole cystic lesion with internal echoes and septations. No hydronephrosis.  Left Kidney:  Length: 11.5 cm. Mildly increased echotexture. 2.2 cm simple appearing cyst in the midpole. No hydronephrosis.  Bladder:  Bladder is incompletely distended. Bladder wall appears mildly thickened which may be related to underdistention. Prostate mildly prominent.  IMPRESSION: Bilateral renal cystic lesions, simple on the left. Minimally complex right upper pole renal cyst with more complex cystic lesion in the midpole. This contains internal septations and debris. This may reflect complex/hemorrhagic cyst, but recommend followup with renal protocol MRI or CT.  Mildly increased echotexture within the kidneys suggesting chronic medical renal disease.  Mildly enlarged prostate. Bladder wall appears mildly thickened although the bladder is not completely filled.   Electronically Signed   By: Rolm Baptise M.D.   On: 01/14/2014 14:35     EKG Interpretation   Date/Time:  Monday January 14 2014 13:54:34 EDT Ventricular Rate:  67 PR Interval:  226 QRS Duration: 158 QT Interval:  430 QTC Calculation: 454 R Axis:   83 Text Interpretation:  Sinus rhythm Prolonged PR interval Right bundle  branch block Similar to prior Confirmed by Aristeo Hankerson  MD, Loma Sousa ([id number]) on  01/14/2014 2:00:07 PM      MDM   Final diagnoses:  Acute renal failure, unspecified acute renal failure type  Cough    Patient presents with multiple complaints and feels he may have the flu. He is nontoxic and exam is reassuring. Vital signs notable for hypertension. Basic labwork obtained. EKG reassuring.  No evidence of leukocytosis. BMP  notable for hyponatremia, creatinine of 6.24 and potassium of 5.6. No widening of the QRS on EKG. Patient reports that he is supposed to followup with her nephrologist in [place] but does not currently have a nephrologist. Given the electrolyte abnormalities, will admit for further evaluation. Nephrology consulted.  I personally performed the services described in this documentation, which was scribed in my presence. The recorded information has been reviewed and is accurate.    Merryl Hacker, MD 01/14/14 [phone]

## 2014-01-14 NOTE — ED Notes (Signed)
Report called to nurse Marcie Bal on 3A and bed assignment was changed to 340.

## 2014-01-14 NOTE — ED Notes (Signed)
Patient c/o cough with "cloudy" thick sputum with fevers x3 weeks. Per patient started as a dry cough. Patient reports"soreness under right rib from coughing."

## 2014-01-14 NOTE — ED Notes (Signed)
Security stated that charge nurse on 3A stated she hasn't assigned a nurse for that room and a nurse would have to call me back once it is assigned.

## 2014-01-14 NOTE — H&P (Signed)
[facility] History and Physical  [name] [id number] DOB: 08/24/1950 DOA: 01/14/2014  Referring physician: ER physician PCP: Geroge Baseman   Chief Complaint: fever, congestion, N/V  HPI:  63 year old male with past medical history of diabetes, hypertension, chronic kidney disease who presented to [facility] ED 01/14/2014 with fevers, chills, cough, congestion ongoing for past few week prior to this admission. Pt also reported having nausea, vomiting and diarrhea. No chest pain, palpitations or shortness of breath. No lightheadedness or loss of consciousness. No reports of blood in stool or urine.  In ED, vitals signs were stable with BP 132/71, HR 67, T max 98.6 F and oxygen saturation 98% on room air. His blood work revealed hemoglobin of 11.4, potassium of 5.6, sodium 130 and new rise in creatinine to 6.24. Renal US showed bilateral renal cystic lesions, simple on the left and minimally complex right upper pole renal cyst with more complex cystic lesion in the midpole which may reflect complex/hemorrhagiccyst. Renal saw the pt in consultation. CXR did not show acute cardiopulmonary findings.   Assessment & Plan    Principal Problem:   Acute on chronic renal failure  Slow increase in creatineine seen over past few years. This new elevation in creatinine is lot worse since last creatinine 4 years ago (2.15).  Appreciate renal consult and recommendations   Pt has received IV fluids in ED.   Per renal lasix 80 mg IV BID started which will help with hyperkalemia.  Renal US showed bilateral renal cystic lesions, simple on the left and minimally complex right upper pole renal cyst with more complex cystic lesion in the midpole which may reflect complex/hemorrhagiccyst.  Check magnesium and phosphorous Active Problems:   Cough, N/V, diarrhea  Possible viral infection  Obtain respiratory viral panel  Supportive care with antiemetics, anti-tussives PRN   IDDM  Restart  lanuts 120 mg daily along with SSI   HYPERLIPIDEMIA  Continue fenofibrate    HYPERTENSION  Hold losartan and Hctz  Restarted clonidine, Norvasc metoprolol 100 mg PO BID and added hydralazine for better BO control    Anemia of chronic disease  Secondary to CKD No current indications for transfusion  Hemoglobin was 11.4 on admission    Hyponatremia / Hyperkalemia  Likely related to acute on chronic renal disease  Added IV fluids per renal  Pt also on lasix 80 mg IV BID for hyperkalemia  DVT prophylaxis: SCD's bilaterally (has possible hemorrhagic renal cyst as evidenced on renal US)  Radiological Exams on Admission: Dg Chest 2 View 01/14/2014    IMPRESSION: No active cardiopulmonary disease.   Electronically Signed   By: Jacqulynn Cadet M.D.   On: 01/14/2014 12:48   US Renal 01/14/2014   IMPRESSION: Bilateral renal cystic lesions, simple on the left. Minimally complex right upper pole renal cyst with more complex cystic lesion in the midpole. This contains internal septations and debris. This may reflect complex/hemorrhagic cyst, but recommend followup with renal protocol MRI or CT.  Mildly increased echotexture within the kidneys suggesting chronic medical renal disease.  Mildly enlarged prostate. Bladder wall appears mildly thickened although the bladder is not completely filled.   Electronically Signed   By: Rolm Baptise M.D.   On: 01/14/2014 14:35    Code Status: Full Family Communication: Plan of care discussed with the patient  Disposition Plan: Admit for further evaluation  Leisa Lenz, MD  [facility] Pager [phone]  Review of Systems:  Constitutional: positive for fever, chills and malaise/fatigue. Negative for  diaphoresis.  HENT: Negative for hearing loss, ear pain, nosebleeds, sore throat, neck pain, tinnitus and ear discharge.   Eyes: Negative for blurred vision, double vision, photophobia, pain, discharge and redness.  Respiratory: positive for cough, no  hemoptysis, sputum production, shortness of breath, wheezing and stridor.   Cardiovascular: Negative for chest pain, palpitations, orthopnea, claudication and leg swelling.  Gastrointestinal: positive for nausea, vomiting and abdominal pain. Negative for heartburn, constipation, blood in stool and melena.  Genitourinary: negative for dysuria, urgency, frequency, hematuria and flank pain.  Musculoskeletal: Negative for myalgias, back pain, joint pain and falls.  Skin: Negative for itching and rash.  Neurological: Negative for dizziness and weakness. Negative for tingling, tremors, sensory change, speech change, focal weakness, loss of consciousness and headaches.  Endo/Heme/Allergies: Negative for environmental allergies and polydipsia. Does not bruise/bleed easily.  Psychiatric/Behavioral: Negative for suicidal ideas. The patient is not nervous/anxious.      Past Medical History  Diagnosis Date  . Hypertension   . Diabetes mellitus without complication   . Kidney infection   . MI (mitral incompetence)    Past Surgical History  Procedure Laterality Date  . Back surgery    . Coronary angioplasty with stent placement     Social History:  reports that he has never smoked. He quit smokeless tobacco use about 3 years ago. His smokeless tobacco use included Chew. He reports that he does not drink alcohol or use illicit drugs.  No Known Allergies  Family History:  Family History  Problem Relation Age of Onset  . Diabetes Other      Prior to Admission medications   Medication Sig Start Date End Date Taking? Authorizing Provider  amLODipine (NORVASC) 10 MG tablet Take 1 tablet by mouth daily. 01/13/14  Yes Historical Provider, MD  aspirin 325 MG tablet Take 325 mg by mouth daily.   Yes Historical Provider, MD  aspirin-sod bicarb-citric acid (ALKA-SELTZER) 325 MG TBEF tablet Take 325-650 mg by mouth daily as needed (head cold).   Yes Historical Provider, MD  cloNIDine (CATAPRES) 0.1 MG  tablet Take 1 tablet by mouth 3 (three) times daily. 11/07/13  Yes Historical Provider, MD  Cromolyn Sodium (NASAL ALLERGY NA) Place 1 spray into both nostrils at bedtime.   Yes Historical Provider, MD  fenofibrate (TRICOR) 145 MG tablet Take 145 mg by mouth daily.   Yes Historical Provider, MD  hydrochlorothiazide (HYDRODIURIL) 25 MG tablet Take 1 tablet by mouth daily. 12/12/13  Yes Historical Provider, MD  LANTUS 100 UNIT/ML injection Inject 120 Units into the skin daily. 60 units on each side of the stomach once daily. 11/28/13  Yes Historical Provider, MD  losartan (COZAAR) 100 MG tablet Take 1 tablet by mouth daily. 12/12/13  Yes Historical Provider, MD  metoprolol succinate (TOPROL-XL) 100 MG 24 hr tablet Take 1 tablet by mouth 2 (two) times daily. 12/12/13  Yes Historical Provider, MD   Physical Exam: Filed Vitals:   01/14/14 1200 01/14/14 1230 01/14/14 1300 01/14/14 1430  BP: 159/93 174/82 132/71 191/103  Pulse: 74 75 68 70  Temp:      TempSrc:      Resp: 20 18 18 15   Height:      Weight:      SpO2: 99% 100% 98% 98%    Physical Exam  Constitutional: Appears well-developed and well-nourished. No distress.  HENT: Normocephalic. No tonsillar erythema or exudates Eyes: Conjunctivae and EOM are normal. PERRLA, no scleral icterus.  Neck: Normal ROM. Neck supple. No JVD. No  tracheal deviation. No thyromegaly.  CVS: RRR, S1/S2 +, no murmurs, no gallops, no carotid bruit.  Pulmonary: Effort and breath sounds normal, no stridor, rhonchi, wheezes, rales.  Abdominal: Soft. BS +,  no distension, tenderness, rebound or guarding.  Musculoskeletal: Normal range of motion. No edema and no tenderness.  Lymphadenopathy: No lymphadenopathy noted, cervical, inguinal. Neuro: Alert. Normal reflexes, muscle tone coordination. No focal neurologic deficits. Skin: Skin is warm and dry. No rash noted. Not diaphoretic. No erythema. No pallor.  Psychiatric: Normal mood and affect. Behavior, judgment, thought  content normal.   Labs on Admission:  Basic Metabolic Panel:  Recent Labs Lab 01/14/14 1219  NA 130*  K 5.6*  CL 96  CO2 24  GLUCOSE 236*  BUN 67*  CREATININE 6.24*  CALCIUM 8.5   Liver Function Tests: No results found for this basename: AST, ALT, ALKPHOS, BILITOT, PROT, ALBUMIN,  in the last 168 hours No results found for this basename: LIPASE, AMYLASE,  in the last 168 hours No results found for this basename: AMMONIA,  in the last 168 hours CBC:  Recent Labs Lab 01/14/14 1219  WBC 7.9  NEUTROABS 5.8  HGB 11.4*  HCT 32.5*  MCV 86.7  PLT 277   Cardiac Enzymes: No results found for this basename: CKTOTAL, CKMB, CKMBINDEX, TROPONINI,  in the last 168 hours BNP: No components found with this basename: POCBNP,  CBG: No results found for this basename: GLUCAP,  in the last 168 hours  If 7PM-7AM, please contact night-coverage [email or web address] Password [facility] 01/14/2014, 2:43 PM

## 2014-01-14 NOTE — ED Notes (Signed)
MD at bedside. 

## 2014-01-14 NOTE — Consult Note (Signed)
Reason for Consult: Worsening of renal failure Referring Physician: Dr. Renaye Rakers [name] is an 63 y.o. male.  HPI: He is a patient who has long-standing history of diabetes, history of poorly controlled hypertension, history of chronic renal failure since 2008 presently came with complaints of cough, fever, chills of about 4 weeks' duration. This is accompanied with history of nausea and one episode of vomiting. Patient also states that he has some diarrhea on and off. Patient denies any difficulty in breathing and no fever or sputum production. Patient was told that he had renal failure and he suggested to him about a year ago that he may require dialysis by [facility]  urology group. Patient however didn't go back to them for another followup. Presently he said [name] feeling better . Patient also has history of diabetic retinopathy and status post surgery was her right eye was very poor vision.  Past Medical History  Diagnosis Date  . Hypertension   . Diabetes mellitus without complication   . Kidney infection   . MI (mitral incompetence)     Past Surgical History  Procedure Laterality Date  . Back surgery    . Coronary angioplasty with stent placement      Family History  Problem Relation Age of Onset  . Diabetes Other     Social History:  reports that he has never smoked. He quit smokeless tobacco use about 3 years ago. His smokeless tobacco use included Chew. He reports that he does not drink alcohol or use illicit drugs.  Allergies: No Known Allergies  Medications: I have reviewed the [name] current medications.  Results for orders placed during the hospital encounter of 01/14/14 (from the past 48 hour([name]))  CBC WITH DIFFERENTIAL     Status: Abnormal   Collection Time    01/14/14 12:19 PM      Result Value Ref Range   WBC 7.9  4.0 - 10.5 K/uL   RBC 3.75 (*) 4.22 - 5.81 MIL/uL   Hemoglobin 11.4 (*) 13.0 - 17.0 g/dL   HCT 32.5 (*) 39.0 - 52.0 %   MCV 86.7  78.0 -  100.0 fL   MCH 30.4  26.0 - 34.0 pg   MCHC 35.1  30.0 - 36.0 g/dL   RDW 13.0  11.5 - 15.5 %   Platelets 277  150 - 400 K/uL   Neutrophils Relative % 73  43 - 77 %   Neutro Abs 5.8  1.7 - 7.7 K/uL   Lymphocytes Relative 17  12 - 46 %   Lymphs Abs 1.3  0.7 - 4.0 K/uL   Monocytes Relative 5  3 - 12 %   Monocytes Absolute 0.4  0.1 - 1.0 K/uL   Eosinophils Relative 5  0 - 5 %   Eosinophils Absolute 0.4  0.0 - 0.7 K/uL   Basophils Relative 0  0 - 1 %   Basophils Absolute 0.0  0.0 - 0.1 K/uL  BASIC METABOLIC PANEL     Status: Abnormal   Collection Time    01/14/14 12:19 PM      Result Value Ref Range   Sodium 130 (*) 137 - 147 mEq/L   Potassium 5.6 (*) 3.7 - 5.3 mEq/L   Chloride 96  96 - 112 mEq/L   CO2 24  19 - 32 mEq/L   Glucose, Bld 236 (*) 70 - 99 mg/dL   BUN 67 (*) 6 - 23 mg/dL   Creatinine, Ser 6.24 (*) 0.50 - 1.35 mg/dL  Calcium 8.5  8.4 - 10.5 mg/dL   GFR calc non Af Amer 9 (*) >90 mL/min   GFR calc Af Amer 10 (*) >90 mL/min   Comment: (NOTE)     The eGFR has been calculated using the CKD EPI equation.     This calculation has not been validated in all clinical situations.     eGFR'[name] persistently <90 mL/min signify possible Chronic Kidney     Disease.   Anion gap 10  5 - 15  CREATININE, URINE, RANDOM     Status: None   Collection Time    01/14/14  1:58 PM      Result Value Ref Range   Creatinine, Urine 69.24    SODIUM, URINE, RANDOM     Status: None   Collection Time    01/14/14  1:58 PM      Result Value Ref Range   Sodium, Ur 48      Dg Chest 2 View  01/14/2014   CLINICAL DATA:  Flu-like symptoms  EXAM: CHEST  2 VIEW  COMPARISON:  Prior chest x-ray 07/13/2009  FINDINGS: The lungs are clear and negative for focal airspace consolidation, pulmonary edema or suspicious pulmonary nodule. No pleural effusion or pneumothorax. Cardiac and mediastinal contours are within normal limits. Metallic coronary stent projects over the LAD. No acute fracture or lytic or blastic  osseous lesions. The visualized upper abdominal bowel gas pattern is unremarkable.  IMPRESSION: No active cardiopulmonary disease.   Electronically Signed   By: Jacqulynn Cadet M.D.   On: 01/14/2014 12:48   US Renal  01/14/2014   CLINICAL DATA:  Hypertension, flank pain  EXAM: RENAL/URINARY TRACT ULTRASOUND COMPLETE  COMPARISON:  None.  FINDINGS: Right Kidney:  Length: 10.9 cm. Slightly increased echotexture throughout the right kidney. 3.9 cm minimally complex cyst in the upper pole with thin internal septation. 2.2 cm midpole cystic lesion with internal echoes and septations. No hydronephrosis.  Left Kidney:  Length: 11.5 cm. Mildly increased echotexture. 2.2 cm simple appearing cyst in the midpole. No hydronephrosis.  Bladder:  Bladder is incompletely distended. Bladder wall appears mildly thickened which may be related to underdistention. Prostate mildly prominent.  IMPRESSION: Bilateral renal cystic lesions, simple on the left. Minimally complex right upper pole renal cyst with more complex cystic lesion in the midpole. This contains internal septations and debris. This may reflect complex/hemorrhagic cyst, but recommend followup with renal protocol MRI or CT.  Mildly increased echotexture within the kidneys suggesting chronic medical renal disease.  Mildly enlarged prostate. Bladder wall appears mildly thickened although the bladder is not completely filled.   Electronically Signed   By: Rolm Baptise M.D.   On: 01/14/2014 14:35    Review of Systems  Constitutional: Positive for chills.  HENT: Positive for congestion.   Eyes: Positive for blurred vision.  Respiratory: Positive for cough. Negative for hemoptysis, sputum production and shortness of breath.   Cardiovascular: Negative for orthopnea and leg swelling.  Gastrointestinal: Positive for nausea, vomiting and diarrhea.  Musculoskeletal: Positive for back pain.  Neurological: Positive for weakness.   Blood pressure 179/91, pulse 71,  temperature 97.8 F (36.6 C), temperature source Oral, resp. rate 16, height '6\' 5"'  (1.956 m), weight 97.07 kg (214 lb), SpO2 100.00%. Physical Exam  Constitutional: He is oriented to person, place, and time. No distress.  Eyes: No scleral icterus.  Neck: No JVD present.  Cardiovascular: Normal rate and regular rhythm.   No murmur heard. Respiratory: No respiratory distress. He has  no wheezes.  GI: He exhibits no distension. There is no tenderness.  Musculoskeletal: He exhibits no edema.  Neurological: He is alert and oriented to person, place, and time.    Assessment/Plan: Problem #1 renal failure: At this moment or sure whether this is acute on chronic or secondary to natural progression of his disease. His creatinine was 1.66 on 01/16/2007, increased to 1.92 on 11/04/2007. And his creatinine was 2.15 on 06/16/2009. 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 Presently hypertensive nephrosclerosis and other etiologies cannot ruled out. Patient presently does not have any uremic sign and symptoms. Problem #2 hyperkalemia: Most likely secondary to chronic renal failure/4 RTA as patient is diabetic/Cozaar. Problem #3 history of diabetes: Long-standing and not controlled very well Problem #4 history of hypertension: Patient states that his blood pressure has been fluctuating up and down hence poorly controlled Problem #5 history of carotid disease/MI Problem #6 history of diabetic retinopathy/bleeding he status post surgery Problem #7 proteinuria: Patient UA from 07/13/2009 showed proteinuria greater than 300 mg/dL. With presence of hypoalbuminemia most likely nephrotic range and possibly from diabetes. Problem #8 anemia Problem #9 metabolic bone disease: Calcium is range but phosphorus is not available. Plan: We'll start patient on normal saline 125 cc per hour We'll use Lasix 80 mg IV twice a day to help [place]  controll his potassium We'll  put patient on low potassium diet We'll hold Cozaar We'll increase clonidine to 0.2 mg by mouth twice a day We'll check his basic metabolic panel, phosphorus, intact PTH in the morning. We'll check urine protein to creatinine ratio. Since patient has been followed by nephrology and possible previous workup presently we'll try to get more information otherwise we'll hold from further additional workup.   [name] 01/14/2014, 4:20 PM

## 2014-01-15 DIAGNOSIS — E109 Type 1 diabetes mellitus without complications: Secondary | ICD-10-CM

## 2014-01-15 DIAGNOSIS — I10 Essential (primary) hypertension: Secondary | ICD-10-CM

## 2014-01-15 DIAGNOSIS — E871 Hypo-osmolality and hyponatremia: Secondary | ICD-10-CM

## 2014-01-15 LAB — COMPREHENSIVE METABOLIC PANEL
ALT: 12 U/L (ref 0–53)
AST: 11 U/L (ref 0–37)
Albumin: 2 g/dL — ABNORMAL LOW (ref 3.5–5.2)
Alkaline Phosphatase: 70 U/L (ref 39–117)
Anion gap: 12 (ref 5–15)
BUN: 66 mg/dL — ABNORMAL HIGH (ref 6–23)
CO2: 24 mEq/L (ref 19–32)
Calcium: 8 mg/dL — ABNORMAL LOW (ref 8.4–10.5)
Chloride: 98 mEq/L (ref 96–112)
Creatinine, Ser: 6.36 mg/dL — ABNORMAL HIGH (ref 0.50–1.35)
GFR calc Af Amer: 10 mL/min — ABNORMAL LOW (ref >90)
GFR calc non Af Amer: 8 mL/min — ABNORMAL LOW (ref >90)
Glucose, Bld: 155 mg/dL — ABNORMAL HIGH (ref 70–99)
Potassium: 4.5 mEq/L (ref 3.7–5.3)
Sodium: 134 mEq/L — ABNORMAL LOW (ref 137–147)
Total Bilirubin: <0.2 mg/dL — ABNORMAL LOW (ref 0.3–1.2)
Total Protein: 5.6 g/dL — ABNORMAL LOW (ref 6.0–8.3)

## 2014-01-15 LAB — GLUCOSE, CAPILLARY
Glucose-Capillary: 128 mg/dL — ABNORMAL HIGH (ref 70–99)
Glucose-Capillary: 88 mg/dL (ref 70–99)
Glucose-Capillary: 124 mg/dL — ABNORMAL HIGH (ref 70–99)

## 2014-01-15 LAB — CBC
HCT: 27 % — ABNORMAL LOW (ref 39.0–52.0)
Hemoglobin: 9.3 g/dL — ABNORMAL LOW (ref 13.0–17.0)
MCH: 30.2 pg (ref 26.0–34.0)
MCHC: 34.4 g/dL (ref 30.0–36.0)
MCV: 87.7 fL (ref 78.0–100.0)
Platelets: 247 10*3/uL (ref 150–400)
RBC: 3.08 MIL/uL — ABNORMAL LOW (ref 4.22–5.81)
RDW: 13.2 % (ref 11.5–15.5)
WBC: 6.6 10*3/uL (ref 4.0–10.5)

## 2014-01-15 LAB — VITAMIN D 25 HYDROXY (VIT D DEFICIENCY, FRACTURES): Vit D, 25-Hydroxy: 18 ng/mL — ABNORMAL LOW (ref 30–89)

## 2014-01-15 LAB — HEMOGLOBIN A1C
Hgb A1c MFr Bld: 9.3 % — ABNORMAL HIGH (ref <5.7)
Mean Plasma Glucose: 220 mg/dL — ABNORMAL HIGH (ref <117)

## 2014-01-15 LAB — PTH, INTACT AND CALCIUM
Calcium, Total (PTH): 8.3 mg/dL — ABNORMAL LOW (ref 8.4–10.5)
PTH: 196.8 pg/mL — ABNORMAL HIGH (ref 14.0–72.0)

## 2014-01-15 LAB — PHOSPHORUS: Phosphorus: 5.1 mg/dL — ABNORMAL HIGH (ref 2.3–4.6)

## 2014-01-15 LAB — HEPATITIS B SURFACE ANTIGEN: Hepatitis B Surface Ag: NEGATIVE

## 2014-01-15 MED ORDER — PNEUMOCOCCAL VAC POLYVALENT 25 MCG/0.5ML IJ INJ
0.5000 mL | INJECTION | INTRAMUSCULAR | Status: AC
  Administered 2014-01-16: 0.5 mL via INTRAMUSCULAR
  Filled 2014-01-15: qty 0.5

## 2014-01-15 MED ORDER — FUROSEMIDE 10 MG/ML IJ SOLN
40.0000 mg | Freq: Two times a day (BID) | INTRAMUSCULAR | Status: DC
  Administered 2014-01-15 – 2014-01-17 (×4): 40 mg via INTRAVENOUS
  Filled 2014-01-15 (×4): qty 4

## 2014-01-15 NOTE — Progress Notes (Signed)
[facility] PROGRESS NOTE  [name] [id number] DOB: [birth year] DOA: 01/14/2014 PCP: Geroge Baseman  Assessment/Plan: 63 year old male with past medical history of diabetes, hypertension, chronic kidney disease who presented to [facility] ED 01/14/2014 with fevers, chills, cough, congestion ongoing for past few week prior to this admission. -admitted with AKI   1. Acute on chronic renal failure  Slow increase in creatineine seen over past few years. This new elevation in creatinine is lot worse since last creatinine 4 years ago (2.15).  Renal US showed bilateral renal cystic lesions, simple on the left and minimally complex right upper pole renal cyst with more complex cystic lesion in the midpole which may reflect complex/hemorrhagiccyst.  Patient is on IVF, prn diuresis per nephrology; Appreciate renal consult and recommendations   2. Cough, N/V, diarrhea  Possible viral infection  Symptoms resolving; Supportive care with antiemetics, anti-tussives PRN 3. IDDM  Restart lanuts 120 mg daily along with SSI, pend HA1c 4. HYPERLIPIDEMIA  Continue fenofibrate  5. HYPERTENSION uncontrolled  Hold losartan and Hctz  Restarted clonidine, Norvasc metoprolol 100 mg PO BID and added hydralazine for better BO control  6. Anemia of chronic disease  Secondary to CKD No current indications for transfusion  F/u iron profile  7. Hyponatremia / Hyperk, resolved; per nephrology   DVT prophylaxis: SCD's bilaterally (has possible hemorrhagic renal cyst as evidenced on renal US)      Code Status: full Family Communication:  D/w patient, his wife (indicate person spoken with, relationship, and if by phone, the number) Disposition Plan: home pend clinical improvement    Consultants:  Nephrology   Procedures:  none  Antibiotics:  none (indicate start date, and stop date if known)  HPI/Subjective: alert  Objective: Filed Vitals:   01/15/14 1021  BP: 128/65  Pulse:   Temp:    Resp:     Intake/Output Summary (Last 24 hours) at 01/15/14 1234 Last data filed at 01/15/14 1027  Gross per 24 hour  Intake 1169.17 ml  Output   1350 ml  Net -180.83 ml   Filed Weights   01/14/14 1106 01/14/14 1536 01/15/14 0533  Weight: 97.07 kg (214 lb) 97.07 kg (214 lb) 97.024 kg (213 lb 14.4 oz)    Exam:   General:  alert  Cardiovascular: s1,s2 rrr  Respiratory: CTA BL  Abdomen: soft, nt,nd   Musculoskeletal: no edema   Data Reviewed: Basic Metabolic Panel:  Recent Labs Lab 01/14/14 1219 01/15/14 0556  NA 130* 134*  K 5.6* 4.5  CL 96 98  CO2 24 24  GLUCOSE 236* 155*  BUN 67* 66*  CREATININE 6.24* 6.36*  CALCIUM 8.5 8.0*  MG 3.5*  --   PHOS 4.1 5.1*   Liver Function Tests:  Recent Labs Lab 01/15/14 0556  AST 11  ALT 12  ALKPHOS 70  BILITOT <0.2*  PROT 5.6*  ALBUMIN 2.0*   No results found for this basename: LIPASE, AMYLASE,  in the last 168 hours No results found for this basename: AMMONIA,  in the last 168 hours CBC:  Recent Labs Lab 01/14/14 1219 01/15/14 0556  WBC 7.9 6.6  NEUTROABS 5.8  --   HGB 11.4* 9.3*  HCT 32.5* 27.0*  MCV 86.7 87.7  PLT 277 247   Cardiac Enzymes: No results found for this basename: CKTOTAL, CKMB, CKMBINDEX, TROPONINI,  in the last 168 hours BNP (last 3 results) No results found for this basename: PROBNP,  in the last 8760 hours CBG:  Recent Labs Lab 01/14/14  2121 01/15/14 0725 01/15/14 1114  GLUCAP 77 128* 88    No results found for this or any previous visit (from the past 240 hour(s)).   Studies: Dg Chest 2 View  01/14/2014   CLINICAL DATA:  Flu-like symptoms  EXAM: CHEST  2 VIEW  COMPARISON:  Prior chest x-ray 07/13/2009  FINDINGS: The lungs are clear and negative for focal airspace consolidation, pulmonary edema or suspicious pulmonary nodule. No pleural effusion or pneumothorax. Cardiac and mediastinal contours are within normal limits. Metallic coronary stent projects over the LAD. No  acute fracture or lytic or blastic osseous lesions. The visualized upper abdominal bowel gas pattern is unremarkable.  IMPRESSION: No active cardiopulmonary disease.   Electronically Signed   By: Jacqulynn Cadet M.D.   On: 01/14/2014 12:48   US Renal  01/14/2014   CLINICAL DATA:  Hypertension, flank pain  EXAM: RENAL/URINARY TRACT ULTRASOUND COMPLETE  COMPARISON:  None.  FINDINGS: Right Kidney:  Length: 10.9 cm. Slightly increased echotexture throughout the right kidney. 3.9 cm minimally complex cyst in the upper pole with thin internal septation. 2.2 cm midpole cystic lesion with internal echoes and septations. No hydronephrosis.  Left Kidney:  Length: 11.5 cm. Mildly increased echotexture. 2.2 cm simple appearing cyst in the midpole. No hydronephrosis.  Bladder:  Bladder is incompletely distended. Bladder wall appears mildly thickened which may be related to underdistention. Prostate mildly prominent.  IMPRESSION: Bilateral renal cystic lesions, simple on the left. Minimally complex right upper pole renal cyst with more complex cystic lesion in the midpole. This contains internal septations and debris. This may reflect complex/hemorrhagic cyst, but recommend followup with renal protocol MRI or CT.  Mildly increased echotexture within the kidneys suggesting chronic medical renal disease.  Mildly enlarged prostate. Bladder wall appears mildly thickened although the bladder is not completely filled.   Electronically Signed   By: Rolm Baptise M.D.   On: 01/14/2014 14:35    Scheduled Meds: . amLODipine  10 mg Oral Daily  . cloNIDine  0.2 mg Oral TID  . fenofibrate  160 mg Oral Daily  . fluticasone  1 spray Each Nare Daily  . furosemide  40 mg Intravenous BID  . insulin aspart  0-15 Units Subcutaneous TID WC  . insulin aspart  0-5 Units Subcutaneous QHS  . insulin glargine  60 Units Subcutaneous Daily   And  . insulin glargine  60 Units Subcutaneous Daily  . metoprolol succinate  100 mg Oral BID  .  sodium chloride  3 mL Intravenous Q12H   Continuous Infusions: . sodium chloride    . sodium chloride 135 mL/hr at 01/15/14 [id number]    Principal Problem:   Acute on chronic renal failure Active Problems:   IDDM   HYPERLIPIDEMIA   HYPERTENSION   CAD   Anemia of chronic disease   Hyponatremia   Hyperkalemia    Time spent: >35 minutes     Kinnie Feil  [facility] Pager [phone]. If 7PM-7AM, please contact night-coverage at [email or web address], password [facility] 01/15/2014, 12:34 PM  LOS: 1 day

## 2014-01-15 NOTE — Progress Notes (Signed)
UR review complete.  

## 2014-01-15 NOTE — Care Management Note (Signed)
    Page 1 of 1   01/17/2014     2:56:26 PM CARE MANAGEMENT NOTE 01/17/2014  Patient:  [name], [name]   Account Number:  [id number]  Date Initiated:  01/15/2014  Documentation initiated by:  Vladimir Creeks  Subjective/Objective Assessment:   Admitted with ARF, DM, hyponatremia. Pt is from with spouse and plans to return home at D/C.     Action/Plan:   No needs identified, but will follow.   Anticipated DC Date:  01/18/2014   Anticipated DC Plan:  [place]  CM consult      Choice offered to / List presented to:             Status of service:  Completed, signed off Medicare Important Message given?  YES (If response is "NO", the following Medicare IM given date fields will be blank) Date Medicare IM given:  01/17/2014 Medicare IM given by:  Theophilus Kinds Date Additional Medicare IM given:   Additional Medicare IM given by:    Discharge Disposition:    Per UR Regulation:  Reviewed for med. necessity/level of care/duration of stay  If discussed at [place] of Stay Meetings, dates discussed:    Comments:  01/17/14 [place], RN BSN CM Pt discharged home. No CM needs noted.  01/15/14 1100 Vladimir Creeks RN/CM

## 2014-01-15 NOTE — Progress Notes (Signed)
Subjective: Interval History: has no complaint of nausea or vomiting. Patient is still that he'll have any more of diarrhea. He denies also any difficulty in breathing..  Objective: Vital signs in last 24 hours: Temp:  [97.8 F (36.6 C)-98.6 F (37 C)] 98 F (36.7 C) (08/04 0533) Pulse Rate:  [64-83] 64 (08/04 0533) Resp:  [13-20] 20 (08/04 0533) BP: (110-204)/(46-103) 110/46 mmHg (08/04 0533) SpO2:  [98 %-100 %] 100 % (08/04 0533) Weight:  [97.024 kg (213 lb 14.4 oz)-97.07 kg (214 lb)] 97.024 kg (213 lb 14.4 oz) (08/04 0533) Weight change:   Intake/Output from previous day: 08/03 0701 - 08/04 0700 In: 929.2 [I.V.:929.2] Out: 900 [Urine:900] Intake/Output this shift:    General appearance: alert, cooperative and no distress Resp: clear to auscultation bilaterally Cardio: regular rate and rhythm, S1, S2 normal, no murmur, click, rub or gallop GI: soft, non-tender; bowel sounds normal; no masses,  no organomegaly Extremities: extremities normal, atraumatic, no cyanosis or edema  Lab Results:  Recent Labs  01/14/14 1219 01/15/14 0556  WBC 7.9 6.6  HGB 11.4* 9.3*  HCT 32.5* 27.0*  PLT 277 247   BMET:  Recent Labs  01/14/14 1219 01/15/14 0556  NA 130* 134*  K 5.6* 4.5  CL 96 98  CO2 24 24  GLUCOSE 236* 155*  BUN 67* 66*  CREATININE 6.24* 6.36*  CALCIUM 8.5 8.0*   No results found for this basename: PTH,  in the last 72 hours Iron Studies: No results found for this basename: IRON, TIBC, TRANSFERRIN, FERRITIN,  in the last 72 hours  Studies/Results: Dg Chest 2 View  01/14/2014   CLINICAL DATA:  Flu-like symptoms  EXAM: CHEST  2 VIEW  COMPARISON:  Prior chest x-ray 07/13/2009  FINDINGS: The lungs are clear and negative for focal airspace consolidation, pulmonary edema or suspicious pulmonary nodule. No pleural effusion or pneumothorax. Cardiac and mediastinal contours are within normal limits. Metallic coronary stent projects over the LAD. No acute fracture or lytic  or blastic osseous lesions. The visualized upper abdominal bowel gas pattern is unremarkable.  IMPRESSION: No active cardiopulmonary disease.   Electronically Signed   By: Jacqulynn Cadet M.D.   On: 01/14/2014 12:48   US Renal  01/14/2014   CLINICAL DATA:  Hypertension, flank pain  EXAM: RENAL/URINARY TRACT ULTRASOUND COMPLETE  COMPARISON:  None.  FINDINGS: Right Kidney:  Length: 10.9 cm. Slightly increased echotexture throughout the right kidney. 3.9 cm minimally complex cyst in the upper pole with thin internal septation. 2.2 cm midpole cystic lesion with internal echoes and septations. No hydronephrosis.  Left Kidney:  Length: 11.5 cm. Mildly increased echotexture. 2.2 cm simple appearing cyst in the midpole. No hydronephrosis.  Bladder:  Bladder is incompletely distended. Bladder wall appears mildly thickened which may be related to underdistention. Prostate mildly prominent.  IMPRESSION: Bilateral renal cystic lesions, simple on the left. Minimally complex right upper pole renal cyst with more complex cystic lesion in the midpole. This contains internal septations and debris. This may reflect complex/hemorrhagic cyst, but recommend followup with renal protocol MRI or CT.  Mildly increased echotexture within the kidneys suggesting chronic medical renal disease.  Mildly enlarged prostate. Bladder wall appears mildly thickened although the bladder is not completely filled.   Electronically Signed   By: Rolm Baptise M.D.   On: 01/14/2014 14:35    I have reviewed the patient's current medications.  Assessment/Plan: Problem #1 renal failure: Presently seems to be chronic . His BUN and creatinine is increasing. Patient  does not have any uremic sign and symptoms. Patient is none oliguric Problem #2 hyperkalemia: His potassium has normalized Problem #3 hypertension: His blood pressure seems reasonably controlled Problem #4 diabetes Problem #5 anemia: His hemoglobin hematocrit has declined. Probably from  hydration. Problem #6 metabolic bone disease: His calcium range however his phosphorus slightly high. Patient is not on any binder. His vitamin D level is low at [facility] is pending. Problem #7 proteinuria: Nephrotic range and most likely secondary to his diabetes Plan: We'll increase his IV fluid to 135 cc per hour We'll decrease Lasix to 40 mg IV twice a day We'll check his basic metabolic panel in the morning. Would also iron studies. If his renal function continued to tolerate have discussed with him about putting an access and starting dialysis. If however his renal function will stabilize possibly we'll follow him as an outpatient and make a decision accordingly.       LOS: 1 day   [name] S 01/15/2014,9:09 AM

## 2014-01-16 DIAGNOSIS — N179 Acute kidney failure, unspecified: Secondary | ICD-10-CM | POA: Diagnosis not present

## 2014-01-16 LAB — GLUCOSE, CAPILLARY
Glucose-Capillary: 76 mg/dL (ref 70–99)
Glucose-Capillary: 156 mg/dL — ABNORMAL HIGH (ref 70–99)
Glucose-Capillary: 122 mg/dL — ABNORMAL HIGH (ref 70–99)
Glucose-Capillary: 264 mg/dL — ABNORMAL HIGH (ref 70–99)

## 2014-01-16 LAB — IRON AND TIBC
Iron: 51 ug/dL (ref 42–135)
Saturation Ratios: 25 % (ref 20–55)
TIBC: 204 ug/dL — ABNORMAL LOW (ref 215–435)
UIBC: 153 ug/dL (ref 125–400)

## 2014-01-16 LAB — BASIC METABOLIC PANEL
Anion gap: 11 (ref 5–15)
BUN: 61 mg/dL — ABNORMAL HIGH (ref 6–23)
CO2: 24 mEq/L (ref 19–32)
Calcium: 8.3 mg/dL — ABNORMAL LOW (ref 8.4–10.5)
Chloride: 101 mEq/L (ref 96–112)
Creatinine, Ser: 6.55 mg/dL — ABNORMAL HIGH (ref 0.50–1.35)
GFR calc Af Amer: 9 mL/min — ABNORMAL LOW (ref >90)
GFR calc non Af Amer: 8 mL/min — ABNORMAL LOW (ref >90)
Glucose, Bld: 104 mg/dL — ABNORMAL HIGH (ref 70–99)
Potassium: 4.8 mEq/L (ref 3.7–5.3)
Sodium: 136 mEq/L — ABNORMAL LOW (ref 137–147)

## 2014-01-16 LAB — CBC
HCT: 30.1 % — ABNORMAL LOW (ref 39.0–52.0)
Hemoglobin: 10.3 g/dL — ABNORMAL LOW (ref 13.0–17.0)
MCH: 30 pg (ref 26.0–34.0)
MCHC: 34.2 g/dL (ref 30.0–36.0)
MCV: 87.8 fL (ref 78.0–100.0)
Platelets: 252 10*3/uL (ref 150–400)
RBC: 3.43 MIL/uL — ABNORMAL LOW (ref 4.22–5.81)
RDW: 13.1 % (ref 11.5–15.5)
WBC: 7.7 10*3/uL (ref 4.0–10.5)

## 2014-01-16 LAB — FERRITIN: Ferritin: 272 ng/mL (ref 22–322)

## 2014-01-16 MED ORDER — VITAMIN D 1000 UNITS PO TABS
1000.0000 [IU] | ORAL_TABLET | Freq: Every day | ORAL | Status: DC
  Administered 2014-01-16 – 2014-01-17 (×2): 1000 [IU] via ORAL
  Filled 2014-01-16 (×2): qty 1

## 2014-01-16 MED ORDER — INSULIN GLARGINE 100 UNIT/ML ~~LOC~~ SOLN
100.0000 [IU] | Freq: Every day | SUBCUTANEOUS | Status: DC
  Administered 2014-01-16: 100 [IU] via SUBCUTANEOUS
  Filled 2014-01-16 (×2): qty 1

## 2014-01-16 MED ORDER — INSULIN GLARGINE 100 UNIT/ML ~~LOC~~ SOLN: 50.0000 [IU] | Freq: Every day | SUBCUTANEOUS | Status: DC

## 2014-01-16 MED ORDER — CALCIUM ACETATE 667 MG PO CAPS
667.0000 mg | ORAL_CAPSULE | Freq: Every day | ORAL | Status: DC
  Administered 2014-01-17: 667 mg via ORAL
  Filled 2014-01-16: qty 1

## 2014-01-16 NOTE — Progress Notes (Signed)
[facility] PROGRESS NOTE  [name] [id number] DOB: 25-Mar-1951 DOA: 01/14/2014 PCP: [name]  Assessment/Plan: 1. Acute on chronic renal failure  Slowly rising creatineine seen over past few years. Not oliguric Renal US showed bilateral renal cystic lesions, simple on the left and minimally complex right upper pole renal cyst with more complex cystic lesion in the midpole which may reflect complex/hemorrhagiccyst.  Patient cont on 135cc/hr IVF, 40mg  BID lasix per nephrology Follow renal function closely - may need HD if renal function continues to decompensate, otherwise Nephro recs for outp follow up if renal function improves 2. Cough, N/V, diarrhea  Suspected viral infection  Symptoms resolving Cont supportive care with antiemetics, anti-tussives PRN 3. IDDM  On 100units lantus daily along with SSI, pend HA1c 4. HYPERLIPIDEMIA  Continue fenofibrate  5. HYPERTENSION uncontrolled  Hold losartan and Hctz  Resumed clonidine, Norvasc metoprolol 100 mg PO BID and added hydralazine for better BO control  6. Anemia of chronic disease  Secondary to CKD No current indications for transfusion  Iron w/in normal limits 7. Hyponatremia / Hyperk, resolved; per nephrology  Code Status: Full Family Communication: Pt in room, family at bedside Disposition Plan: Pending   Consultants:  Nephrology  Procedures:    Antibiotics:     HPI/Subjective: Pt eager to go home. Reports voiding well. Reports feeling "gassy," denies constipation.  Objective: Filed Vitals:   01/15/14 1432 01/15/14 2231 01/16/14 0500 01/16/14 1425  BP: 153/75 170/68 149/77 169/76  Pulse: 66 64 65 66  Temp: 98.3 F (36.8 C) 97.8 F (36.6 C) 97.7 F (36.5 C) 97.4 F (36.3 C)  TempSrc: Oral Oral  Oral  Resp: 20 20  20   Height:      Weight:   97.793 kg (215 lb 9.5 oz)   SpO2: 100% 100% 100% 100%    Intake/Output Summary (Last 24 hours) at 01/16/14 1535 Last data filed at  01/16/14 1426  Gross per 24 hour  Intake 4740.83 ml  Output      0 ml  Net 4740.83 ml   Filed Weights   01/14/14 1536 01/15/14 0533 01/16/14 0500  Weight: 97.07 kg (214 lb) 97.024 kg (213 lb 14.4 oz) 97.793 kg (215 lb 9.5 oz)    Exam:   General:  Awake, in nad  Cardiovascular: regular, s1, s2  Respiratory: normal resp effort, no wheezing  Abdomen: soft,nondistended  Musculoskeletal: perfused, no clubbing   Data Reviewed: Basic Metabolic Panel:  Recent Labs Lab 01/14/14 1219 01/14/14 1708 01/15/14 0556 01/16/14 0625  NA 130*  --  134* 136*  K 5.6*  --  4.5 4.8  CL 96  --  98 101  CO2 24  --  24 24  GLUCOSE 236*  --  155* 104*  BUN 67*  --  66* 61*  CREATININE 6.24*  --  6.36* 6.55*  CALCIUM 8.5 8.3* 8.0* 8.3*  MG 3.5*  --   --   --   PHOS 4.1  --  5.1*  --    Liver Function Tests:  Recent Labs Lab 01/15/14 0556  AST 11  ALT 12  ALKPHOS 70  BILITOT <0.2*  PROT 5.6*  ALBUMIN 2.0*   No results found for this basename: LIPASE, AMYLASE,  in the last 168 hours No results found for this basename: AMMONIA,  in the last 168 hours CBC:  Recent Labs Lab 01/14/14 1219 01/15/14 0556 01/16/14 0625  WBC 7.9 6.6 7.7  NEUTROABS 5.8  --   --   HGB  11.4* 9.3* 10.3*  HCT 32.5* 27.0* 30.1*  MCV 86.7 87.7 87.8  PLT 277 247 252   Cardiac Enzymes: No results found for this basename: CKTOTAL, CKMB, CKMBINDEX, TROPONINI,  in the last 168 hours BNP (last 3 results) No results found for this basename: PROBNP,  in the last 8760 hours CBG:  Recent Labs Lab 01/15/14 0725 01/15/14 1114 01/15/14 1620 01/16/14 0754 01/16/14 1143  GLUCAP 128* 88 124* 76 156*    No results found for this or any previous visit (from the past 240 hour(s)).   Studies: No results found.  Scheduled Meds: . amLODipine  10 mg Oral Daily  . cloNIDine  0.2 mg Oral TID  . fenofibrate  160 mg Oral Daily  . fluticasone  1 spray Each Nare Daily  . furosemide  40 mg Intravenous BID  .  insulin aspart  0-15 Units Subcutaneous TID WC  . insulin aspart  0-5 Units Subcutaneous QHS  . insulin glargine  100 Units Subcutaneous Daily  . metoprolol succinate  100 mg Oral BID  . sodium chloride  3 mL Intravenous Q12H   Continuous Infusions: . sodium chloride    . sodium chloride 135 mL/hr at 01/16/14 [id number]    Principal Problem:   Acute on chronic renal failure Active Problems:   IDDM   HYPERLIPIDEMIA   HYPERTENSION   CAD   Anemia of chronic disease   Hyponatremia   Hyperkalemia  Time spent: 3min  [name], [facility] Pager [phone]. If 7PM-7AM, please contact night-coverage at [email or web address], password [facility] 01/16/2014, 3:35 PM  LOS: 2 days

## 2014-01-16 NOTE — Progress Notes (Signed)
[name]  MRN: [id number]  DOB/AGE: [birth year] 62 y.o.  Primary Care Physician:CLAGGETT,ELIN, PA-C  Admit date: 01/14/2014  Chief Complaint:  Chief Complaint  Patient presents with  . Cough  . Fever    S-Pt presented on  01/14/2014 with  Chief Complaint  Patient presents with  . Cough  . Fever  .    Pt today feels better  Meds . amLODipine  10 mg Oral Daily  . cloNIDine  0.2 mg Oral TID  . fenofibrate  160 mg Oral Daily  . fluticasone  1 spray Each Nare Daily  . furosemide  40 mg Intravenous BID  . insulin aspart  0-15 Units Subcutaneous TID WC  . insulin aspart  0-5 Units Subcutaneous QHS  . insulin glargine  60 Units Subcutaneous Daily   And  . insulin glargine  60 Units Subcutaneous Daily  . metoprolol succinate  100 mg Oral BID  . pneumococcal 23 valent vaccine  0.5 mL Intramuscular Tomorrow-1000  . sodium chloride  3 mL Intravenous Q12H       Physical Exam: Vital signs in last 24 hours: Temp:  [97.7 F (36.5 C)-98.3 F (36.8 C)] 97.7 F (36.5 C) (08/05 0500) Pulse Rate:  [64-66] 65 (08/05 0500) Resp:  [20] 20 (08/04 2231) BP: (128-170)/(65-77) 149/77 mmHg (08/05 0500) SpO2:  [100 %] 100 % (08/05 0500) Weight:  [215 lb 9.5 oz (97.793 kg)] 215 lb 9.5 oz (97.793 kg) (08/05 0500) Weight change: 1 lb 9.5 oz (0.723 kg) Last BM Date: 01/14/14  Intake/Output from previous day: 08/04 0701 - 08/05 0700 In: 4410.8 [P.O.:390; I.V.:4020.8] Out: 450 [Urine:450]     Physical Exam: General- pt is awake,alert, oriented to time place and person Resp- No acute REsp distress, CTA B/L NO Rhonchi CVS- S1S2 regular in rate and rhythm GIT- BS+, soft, NT, ND EXT- NO LE Edema, Cyanosis   Lab Results: CBC  Recent Labs  01/15/14 0556 01/16/14 0625  WBC 6.6 7.7  HGB 9.3* 10.3*  HCT 27.0* 30.1*  PLT 247 252    BMET  Recent Labs  01/15/14 0556 01/16/14 0625  NA 134* 136*  K 4.5 4.8  CL 98 101  CO2 24 24  GLUCOSE 155* 104*  BUN 66* 61*   CREATININE 6.36* 6.55*  CALCIUM 8.0* 8.3*   Creat trend 2015 6.24=> 6.36=>6.55 2009 1.92 2008 1.66   Lab Results  Component Value Date   PTH 196.8* 01/14/2014   CALCIUM 8.3* 01/16/2014   PHOS 5.1* 01/15/2014               Impression: 1)Renal  AKI secondary to ATN/Prerenal vs CKD progression               AKI on CKD               CKD stage 4.               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 Secondary Hyperparathyroidism  present . Phosphorus at goal. Vitamin 25-OH low.  5)DM Primary MD following  6)Electrolytes  Hyperkalemic Better  Hyponatremic better  7)Acid base Co2 at goal     Plan:  Will start vitamin d replacement will start binders once dail with dinner  Educated pt about worseing GFr and possible need of renal replacement therapy soon.       Yorel Redder S 01/16/2014, 10:19 AM

## 2014-01-17 LAB — RESPIRATORY VIRUS PANEL
Adenovirus: NOT DETECTED
Influenza A H1: NOT DETECTED
Influenza A H3: NOT DETECTED
Influenza A: NOT DETECTED
Influenza B: NOT DETECTED
Metapneumovirus: NOT DETECTED
Parainfluenza 1: NOT DETECTED
Parainfluenza 2: NOT DETECTED
Parainfluenza 3: NOT DETECTED
Respiratory Syncytial Virus A: NOT DETECTED
Respiratory Syncytial Virus B: NOT DETECTED
Rhinovirus: NOT DETECTED

## 2014-01-17 LAB — GLUCOSE, CAPILLARY
Glucose-Capillary: 57 mg/dL — ABNORMAL LOW (ref 70–99)
Glucose-Capillary: 72 mg/dL (ref 70–99)
Glucose-Capillary: 118 mg/dL — ABNORMAL HIGH (ref 70–99)

## 2014-01-17 LAB — BASIC METABOLIC PANEL
Anion gap: 10 (ref 5–15)
BUN: 55 mg/dL — ABNORMAL HIGH (ref 6–23)
CO2: 25 mEq/L (ref 19–32)
Calcium: 8.4 mg/dL (ref 8.4–10.5)
Chloride: 103 mEq/L (ref 96–112)
Creatinine, Ser: 6.03 mg/dL — ABNORMAL HIGH (ref 0.50–1.35)
GFR calc Af Amer: 10 mL/min — ABNORMAL LOW (ref >90)
GFR calc non Af Amer: 9 mL/min — ABNORMAL LOW (ref >90)
Glucose, Bld: 67 mg/dL — ABNORMAL LOW (ref 70–99)
Potassium: 4 mEq/L (ref 3.7–5.3)
Sodium: 138 mEq/L (ref 137–147)

## 2014-01-17 MED ORDER — INSULIN GLARGINE 100 UNIT/ML ~~LOC~~ SOLN
80.0000 [IU] | Freq: Every day | SUBCUTANEOUS | Status: DC
  Administered 2014-01-17: 80 [IU] via SUBCUTANEOUS
  Filled 2014-01-17 (×2): qty 0.8

## 2014-01-17 MED ORDER — FUROSEMIDE 20 MG PO TABS: 60.0000 mg | ORAL_TABLET | Freq: Every day | ORAL | Status: DC

## 2014-01-17 MED ORDER — CHOLECALCIFEROL 25 MCG (1000 UT) PO TABS: 1000.0000 [IU] | ORAL_TABLET | Freq: Every day | ORAL | Status: DC

## 2014-01-17 MED ORDER — CALCIUM ACETATE 667 MG PO CAPS: 667.0000 mg | ORAL_CAPSULE | Freq: Every day | ORAL | Status: DC

## 2014-01-17 MED ORDER — INSULIN GLARGINE 100 UNIT/ML ~~LOC~~ SOLN: 80.0000 [IU] | Freq: Every day | SUBCUTANEOUS | Status: DC

## 2014-01-17 MED ORDER — GLUCOSE 40 % PO GEL
ORAL | Status: AC
  Administered 2014-01-17: 37.5 g
  Filled 2014-01-17: qty 1

## 2014-01-17 NOTE — Progress Notes (Signed)
Subjective: Interval History: Patient offers no complaint. Denies any difficulty in breathing  Objective: Vital signs in last 24 hours: Temp:  [97.4 F (36.3 C)-97.9 F (36.6 C)] 97.9 F (36.6 C) (08/06 0610) Pulse Rate:  [66-72] 70 (08/06 0610) Resp:  [20] 20 (08/06 0610) BP: (155-169)/(70-82) 157/70 mmHg (08/06 0610) SpO2:  [100 %] 100 % (08/06 0610) Weight:  [97.523 kg (215 lb)] 97.523 kg (215 lb) (08/06 0610) Weight change: -0.27 kg (-9.5 oz)  Intake/Output from previous day: 08/05 0701 - 08/06 0700 In: 960 [P.O.:960] Out: 800 [Urine:800] Intake/Output this shift: Total I/O In: 240 [P.O.:240] Out: -   General appearance: alert, cooperative and no distress Resp: clear to auscultation bilaterally Cardio: regular rate and rhythm, S1, S2 normal, no murmur, click, rub or gallop GI: soft, non-tender; bowel sounds normal; no masses,  no organomegaly Extremities: extremities normal, atraumatic, no cyanosis or edema  Lab Results:  Recent Labs  01/15/14 0556 01/16/14 0625  WBC 6.6 7.7  HGB 9.3* 10.3*  HCT 27.0* 30.1*  PLT 247 252   BMET:   Recent Labs  01/16/14 0625 01/17/14 0602  NA 136* 138  K 4.8 4.0  CL 101 103  CO2 24 25  GLUCOSE 104* 67*  BUN 61* 55*  CREATININE 6.55* 6.03*  CALCIUM 8.3* 8.4    Recent Labs  01/14/14 1708  PTH 196.8*   Iron Studies:   Recent Labs  01/16/14 0625  IRON 51  TIBC 204*  FERRITIN 272    Studies/Results: No results found.  I have reviewed the patient's current medications.  Assessment/Plan: Problem #1 renal failure: Presently seems to be chronic . His BUN and creatinine is improving. Patient does not have any uremic sign and symptoms. Patient is none oliguric Problem #2 hyperkalemia: His potassium has normalized Problem #3 hypertension: His blood pressure seems reasonably controlled Problem #4 diabetes Problem #5 anemia: His hemoglobin hematocrit is stable. His iron saturation and ferritin is normal  hence  anemia of chronic diseases Problem #6 metabolic bone disease: His calcium range however his phosphorus slightly high. Patient is started on binder. His PTH is also high Problem #7 proteinuria: Nephrotic range and most likely secondary to his diabetes Plan: we will check his Basic metabolic panel on Monday as out patient and make a decision about acces placement and dialysis We will d/c lasix We will continue with ivf if patient is going to be here .        LOS: 3 days   Raghad Lorenz S 01/17/2014,10:29 AM

## 2014-01-17 NOTE — Progress Notes (Signed)
Inpatient Diabetes Program Recommendations  AACE/ADA: New Consensus Statement on Inpatient Glycemic Control (2013)  Target Ranges:  Prepandial:   less than 140 mg/dL      Peak postprandial:   less than 180 mg/dL (1-2 hours)      Critically ill patients:  140 - 180 mg/dL   Results for [name], [name] (MRN [id number]) as of 01/17/2014 08:25  Ref. Range 01/16/2014 07:54 01/16/2014 11:43 01/16/2014 16:59 01/16/2014 21:09 01/17/2014 08:04  Glucose-Capillary Latest Range: 70-99 mg/dL 76 156 (H) 122 (H) 264 (H) 57 (L)   Diabetes history: DM2 Outpatient Diabetes medications: Lantus 120 units daily Current orders for Inpatient glycemic control: Lantus 100 units daily, Novolog 0-15 units AC, Novolog 0-5 units HS  Inpatient Diabetes Program Recommendations Insulin - Basal: Please consider decreasing Lantus to 85 units daily.  Thanks, Barnie Alderman, RN, MSN, CCRN Diabetes Coordinator Inpatient Diabetes Program [phone] (Team Pager) [phone] ([facility] office) [phone] [facility] office)

## 2014-01-17 NOTE — Progress Notes (Signed)
Discharged home with instructions given on medications,and follow up visits,patient verbalized understanding. Prescriptions sent with patient.No c/o pain or discomfort noted.Accompanied by staff to awaiting vehicle.

## 2014-01-17 NOTE — Progress Notes (Signed)
PROGRESS NOTE  [name] [id number] DOB: [birth year] DOA: 01/14/2014 PCP: Geroge Baseman  Summary: 63 year old man with history of diabetes mellitus, hypertension presented with intermittent cough, fever, chills, sore throat for 3 weeks. Initial evaluation revealed acute renal failure with creatinine 6.24, renal ultrasound revealed bilateral renal cystic lesions. He was seen in consultation with nephrology and treated with diuretics and management of blood pressure with gradual improvement in creatinine.  Assessment/Plan: 1. 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 Somewhat improved today, nonoliguric . Losartan and hydrochlorothiazide discontinued. In the past he had been told he had renal failure and dialysis may be required. However he did not followup in [place]. 2. Cough, nausea vomiting, diarrhea. Completely resolved. Possibly viral.  3. 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 Hypoglycemia this morning. 4. HTN. Discontined losartan and hydrochlorothiazide.  5. Anemia, likely of chronic disease, likely secondary to kidney disease.  6. Hyponatremia, hyperkalemia. Resolved. Likely secondary to acute renal failure.  7. Bilateral renal cystic lesions, consider complex cystic lesion right kidney, consider followup MRI renal protocol or CT as an outpatient. Chronic medical renal disease seen on ultrasound.   Kidney function somewhat improved, nonoliguric. Discussed in detail nephrologist he recommends discharge home with outpatient followup. Plan for laboratory studies 8/17 with decisions at that time in regard to access long-term in followup in the office thereafter, this will be coordinated by the nephrologist.  Lasix 60 mg PO daily; continue Phoslo and Vitamin D.  Decrease Lantus to 80 units daily. Patient follows blood sugars closely, discussed rationale for  decreased insulin and the possible need to titrate down further. Wife understands well, present at bedside.  Murray Hodgkins, MD  [facility]  Pager [phone] If 7PM-7AM, please contact night-coverage at [email or web address], password [facility] 01/17/2014, 11:21 AM  LOS: 3 days   Consultants:  Nephrology  Procedures:    Antibiotics:    HPI/Subjective: Hypoglycemic this morning. BUN and creatinine improving. Nonoliguric. Nephrology has discontinued Lasix, close outpatient followup 8/10.  Patient feels good. He did feel hypoglycemic this morning. She is eating well. He has no complaints. He would like to go home.  Objective: Filed Vitals:   01/16/14 1425 01/16/14 1655 01/16/14 2104 01/17/14 0610  BP: 169/76 162/82 155/73 157/70  Pulse: 66  72 70  Temp: 97.4 F (36.3 C)  97.5 F (36.4 C) 97.9 F (36.6 C)  TempSrc: Oral  Oral Oral  Resp: 20  20 20   Height:      Weight:    97.523 kg (215 lb)  SpO2: 100%  100% 100%    Intake/Output Summary (Last 24 hours) at 01/17/14 1121 Last data filed at 01/17/14 1001  Gross per 24 hour  Intake    960 ml  Output    800 ml  Net    160 ml     Filed Weights   01/15/14 0533 01/16/14 0500 01/17/14 0610  Weight: 97.024 kg (213 lb 14.4 oz) 97.793 kg (215 lb 9.5 oz) 97.523 kg (215 lb)    Exam:     Afebrile, vital signs stable. No hypoxia.  Gen. Appears calm and comfortable.  Psych. Alert. Speech fluent and clear.  Cardiovascular. Regular rate and rhythm. No murmur, rub or gallop. Telemetry sinus rhythm.  Respiratory. Clear to auscultation bilaterally. No wheezes, rales or rhonchi. Normal respiratory effort.  Data Reviewed: I/O: Urine output 800 mL plus 2 voids   Chemistry: Blood sugar this morning 57; last two--72, 118. BUN and  creatinine trending downwards. Sodium, potassium normal. Hemoglobin A1c 9.3.   Heme: Hemoglobin stable 10.3.  Scheduled Meds: . amLODipine  10 mg Oral Daily  . calcium acetate  667 mg Oral Q breakfast    . cholecalciferol  1,000 Units Oral Daily  . cloNIDine  0.2 mg Oral TID  . fenofibrate  160 mg Oral Daily  . fluticasone  1 spray Each Nare Daily  . furosemide  40 mg Intravenous BID  . insulin aspart  0-15 Units Subcutaneous TID WC  . insulin aspart  0-5 Units Subcutaneous QHS  . insulin glargine  80 Units Subcutaneous Daily  . metoprolol succinate  100 mg Oral BID  . sodium chloride  3 mL Intravenous Q12H   Continuous Infusions: . sodium chloride    . sodium chloride 135 mL/hr at 01/16/14 2130    Principal Problem:   Acute on chronic renal failure Active Problems:   IDDM   HYPERLIPIDEMIA   HYPERTENSION   CAD   Anemia of chronic disease   Hyponatremia   Hyperkalemia

## 2014-01-17 NOTE — Discharge Summary (Signed)
Physician Discharge Summary  [name] [id number] DOB: 04-Feb-1951 DOA: 01/14/2014  PCP: Geroge Baseman  Admit date: 01/14/2014 Discharge date: 01/17/2014  Recommendations for Outpatient Follow-up:  1. Acute renal failure superimposed on suspected chronic kidney disease 2. Diabetes mellitus, titrate insulin as indicated  3. Anemia of chronic disease 4. Bilateral renal cystic lesions. Consider MRI renal protocol or CT as an outpatient to further evaluate.   Follow-up Information   Follow up with [facility], MD In 4 days.   Specialty:  Nephrology   Contact information:   [address] [address] [phone]       Follow up with CLAGGETT,ELIN, PA-C In 1 week.   Specialty:  Family Medicine   Contact information:   [address] [place] [address] [phone]      Discharge Diagnoses:  1. Acute renal failure superimposed on suspected chronic kidney disease  2. Nausea, vomiting, diarrhea resolved. 3. 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 Hypertension 5. Anemia of chronic disease 6. Bilateral renal cystic lesions  Discharge Condition: Improved  Disposition: Home   Diet recommendation: Heart healthy diabetic diet   Filed Weights   01/15/14 0533 01/16/14 0500 01/17/14 0610  Weight: 97.024 kg (213 lb 14.4 oz) 97.793 kg (215 lb 9.5 oz) 97.523 kg (215 lb)    History of present illness:  63 year old man with history of diabetes mellitus, hypertension presented with intermittent cough, fever, chills, sore throat for 3 weeks. Initial evaluation revealed acute renal failure with creatinine 6.24, renal ultrasound revealed bilateral renal cystic lesions.   Hospital Course:  [name] was seen in consultation with nephrology and treated with diuretics and management of blood pressure with gradual improvement in creatinine. He remains nonoliguric and asymptomatic. In discussion with nephrology  today, discharged home recommended with close outpatient followup as below.  1. 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 Somewhat improved today, nonoliguric. 2. Cough, nausea vomiting, diarrhea. Completely resolved. Possibly viral.  3. 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 Hypoglycemia this morning. Now improved. 4. HTN. Discontined losartan and hydrochlorothiazide. Stable on clonidine. 5. Anemia, likely of chronic disease, likely secondary to kidney disease. Stable. Followup as an outpatient. 6. Hyponatremia, hyperkalemia. Resolved. Likely secondary to acute renal failure.  7. Bilateral renal cystic lesions, consider complex cystic lesion right kidney, consider followup MRI renal protocol or CT as an outpatient. Chronic medical renal disease seen on ultrasound. Kidney function somewhat improved, nonoliguric. Discussed in detail nephrologist he recommends discharge home with outpatient followup. Plan for laboratory studies 8/17 with decisions at that time in regard to access long-term in followup in the office thereafter, this will be coordinated by the nephrologist (who will arrange blood work).  Lasix 60 mg PO daily; continue Phoslo and Vitamin D.  Decrease Lantus to 80 units daily. Patient follows blood sugars closely, discussed rationale for decreased insulin and the possible need to titrate down further. Wife understands well, present at bedside.  Consultants:  Nephrology Procedures:   none  Discharge Instructions  Discharge Instructions   Activity as tolerated - No restrictions    Complete by:  As directed      Diet - low sodium heart healthy    Complete by:  As directed      Diet Carb Modified    Complete by:  As directed      Discharge instructions    Complete by:  As directed  Call your physician or seek immediate medical attention for blood sugars less  than 70, greater than 400, confusion, fever, nausea, vomiting no urine output, swelling, pain or worsening of condition. The nephrologist has suggested new medications for your kidneys called Lasix, Phoslo and Vitamin D. You should follow-up 8/10 for blood work, his office will contact you.            Medication List    STOP taking these medications       aspirin 325 MG tablet     aspirin-sod bicarb-citric acid 325 MG Tbef tablet  Commonly known as:  ALKA-SELTZER     hydrochlorothiazide 25 MG tablet  Commonly known as:  HYDRODIURIL     losartan 100 MG tablet  Commonly known as:  COZAAR      TAKE these medications       amLODipine 10 MG tablet  Commonly known as:  NORVASC  Take 1 tablet by mouth daily.     calcium acetate 667 MG capsule  Commonly known as:  PHOSLO  Take 1 capsule (667 mg total) by mouth daily with breakfast.     Cholecalciferol 1000 UNITS tablet  Take 1 tablet (1,000 Units total) by mouth daily.     cloNIDine 0.1 MG tablet  Commonly known as:  CATAPRES  Take 1 tablet by mouth 3 (three) times daily.     fenofibrate 145 MG tablet  Commonly known as:  TRICOR  Take 145 mg by mouth daily.     furosemide 20 MG tablet  Commonly known as:  LASIX  Take 3 tablets (60 mg total) by mouth daily.     insulin glargine 100 UNIT/ML injection  Commonly known as:  LANTUS  Inject 0.8 mLs (80 Units total) into the skin daily.     metoprolol succinate 100 MG 24 hr tablet  Commonly known as:  TOPROL-XL  Take 1 tablet by mouth 2 (two) times daily.     NASAL ALLERGY NA  Place 1 spray into both nostrils at bedtime.       No Known Allergies  The results of significant diagnostics from this hospitalization (including imaging, microbiology, ancillary and laboratory) are listed below for reference.    Significant Diagnostic Studies: Dg Chest 2 View  01/14/2014   CLINICAL DATA:  Flu-like symptoms  EXAM: CHEST  2 VIEW  COMPARISON:  Prior chest x-ray 07/13/2009   FINDINGS: The lungs are clear and negative for focal airspace consolidation, pulmonary edema or suspicious pulmonary nodule. No pleural effusion or pneumothorax. Cardiac and mediastinal contours are within normal limits. Metallic coronary stent projects over the LAD. No acute fracture or lytic or blastic osseous lesions. The visualized upper abdominal bowel gas pattern is unremarkable.  IMPRESSION: No active cardiopulmonary disease.   Electronically Signed   By: Jacqulynn Cadet M.D.   On: 01/14/2014 12:48   US Renal  01/14/2014   CLINICAL DATA:  Hypertension, flank pain  EXAM: RENAL/URINARY TRACT ULTRASOUND COMPLETE  COMPARISON:  None.  FINDINGS: Right Kidney:  Length: 10.9 cm. Slightly increased echotexture throughout the right kidney. 3.9 cm minimally complex cyst in the upper pole with thin internal septation. 2.2 cm midpole cystic lesion with internal echoes and septations. No hydronephrosis.  Left Kidney:  Length: 11.5 cm. Mildly increased echotexture. 2.2 cm simple appearing cyst in the midpole. No hydronephrosis.  Bladder:  Bladder is incompletely distended. Bladder wall appears mildly thickened which may be related to underdistention. Prostate mildly prominent.  IMPRESSION: Bilateral renal cystic lesions, simple on  the left. Minimally complex right upper pole renal cyst with more complex cystic lesion in the midpole. This contains internal septations and debris. This may reflect complex/hemorrhagic cyst, but recommend followup with renal protocol MRI or CT.  Mildly increased echotexture within the kidneys suggesting chronic medical renal disease.  Mildly enlarged prostate. Bladder wall appears mildly thickened although the bladder is not completely filled.   Electronically Signed   By: Rolm Baptise M.D.   On: 01/14/2014 14:35   Labs: Basic Metabolic Panel:  Recent Labs Lab 01/14/14 1219 01/14/14 1708 01/15/14 0556 01/16/14 0625 01/17/14 0602  NA 130*  --  134* 136* 138  K 5.6*  --  4.5 4.8  4.0  CL 96  --  98 101 103  CO2 24  --  24 24 25   GLUCOSE 236*  --  155* 104* 67*  BUN 67*  --  66* 61* 55*  CREATININE 6.24*  --  6.36* 6.55* 6.03*  CALCIUM 8.5 8.3* 8.0* 8.3* 8.4  MG 3.5*  --   --   --   --   PHOS 4.1  --  5.1*  --   --    Liver Function Tests:  Recent Labs Lab 01/15/14 0556  AST 11  ALT 12  ALKPHOS 70  BILITOT <0.2*  PROT 5.6*  ALBUMIN 2.0*   CBC:  Recent Labs Lab 01/14/14 1219 01/15/14 0556 01/16/14 0625  WBC 7.9 6.6 7.7  NEUTROABS 5.8  --   --   HGB 11.4* 9.3* 10.3*  HCT 32.5* 27.0* 30.1*  MCV 86.7 87.7 87.8  PLT 277 247 252   CBG:  Recent Labs Lab 01/16/14 1659 01/16/14 2109 01/17/14 0804 01/17/14 0850 01/17/14 1206  GLUCAP 122* 264* 57* 72 118*    Principal Problem:   Acute on chronic renal failure Active Problems:   IDDM   HYPERLIPIDEMIA   HYPERTENSION   CAD   Anemia of chronic disease   Hyponatremia   Hyperkalemia   Time coordinating discharge: 35 minutes   Signed:  Murray Hodgkins, MD [facility] 01/17/2014, 1:31 PM

## 2014-01-17 NOTE — Progress Notes (Signed)
Patient's CBG this am was 57,asymptomatic,Hypoglycemic protocol initiated.Dr Florencia Reasons notified,will continue to monitor patient.No c/o pain or discomfort noted.

## 2014-01-28 ENCOUNTER — Encounter: Payer: Self-pay | Admitting: Vascular Surgery

## 2014-01-28 ENCOUNTER — Telehealth: Payer: Self-pay | Admitting: Vascular Surgery

## 2014-01-28 ENCOUNTER — Other Ambulatory Visit: Payer: Self-pay

## 2014-01-28 DIAGNOSIS — N186 End stage renal disease: Secondary | ICD-10-CM

## 2014-01-28 DIAGNOSIS — Z01810 Encounter for preprocedural cardiovascular examination: Secondary | ICD-10-CM

## 2014-01-28 NOTE — Telephone Encounter (Signed)
[name] to notify pt of appt for 08/18 @ 230/315 with Lab/TFE

## 2014-01-28 NOTE — Telephone Encounter (Signed)
Message copied by Gena Fray on Mon Jan 28, 2014 10:27 AM ------      Message from: Denman George      Created: Mon Jan 28, 2014  9:59 AM      Regarding: vein map and OV       [name],from Dr. Florentina Addison office,called to request placement of dialysis cath. this week.  Per [facility], it would be better to schedule a vein mapping and OV to prepare for both catheter and perm. access placement.  Please see what we can do to get him in 8/18 or 8/19.  [name]'s # [phone].  Thanks.  ------

## 2014-01-29 ENCOUNTER — Other Ambulatory Visit: Payer: Self-pay

## 2014-01-29 ENCOUNTER — Encounter: Payer: Self-pay | Admitting: Vascular Surgery

## 2014-01-29 ENCOUNTER — Other Ambulatory Visit: Payer: Self-pay | Admitting: Vascular Surgery

## 2014-01-29 ENCOUNTER — Encounter (HOSPITAL_COMMUNITY): Payer: Self-pay | Admitting: *Deleted

## 2014-01-29 ENCOUNTER — Other Ambulatory Visit (HOSPITAL_COMMUNITY): Payer: Self-pay | Admitting: Specialist

## 2014-01-29 ENCOUNTER — Ambulatory Visit (INDEPENDENT_AMBULATORY_CARE_PROVIDER_SITE_OTHER)
Admission: RE | Admit: 2014-01-29 | Discharge: 2014-01-29 | Disposition: A | Discharge status: Home / Self Care | Payer: Medicare Other | Source: Ambulatory Visit | Attending: Vascular Surgery | Admitting: Vascular Surgery

## 2014-01-29 ENCOUNTER — Ambulatory Visit (INDEPENDENT_AMBULATORY_CARE_PROVIDER_SITE_OTHER): Payer: Medicare Other | Admitting: Vascular Surgery

## 2014-01-29 VITALS — BP 193/99 | HR 80 | Resp 16 | Ht 77.0 in | Wt 214.0 lb

## 2014-01-29 DIAGNOSIS — Z01810 Encounter for preprocedural cardiovascular examination: Secondary | ICD-10-CM

## 2014-01-29 DIAGNOSIS — F3289 Other specified depressive episodes: Secondary | ICD-10-CM | POA: Diagnosis not present

## 2014-01-29 DIAGNOSIS — F329 Major depressive disorder, single episode, unspecified: Secondary | ICD-10-CM | POA: Diagnosis not present

## 2014-01-29 DIAGNOSIS — N185 Chronic kidney disease, stage 5: Secondary | ICD-10-CM

## 2014-01-29 DIAGNOSIS — I120 Hypertensive chronic kidney disease with stage 5 chronic kidney disease or end stage renal disease: Secondary | ICD-10-CM | POA: Diagnosis not present

## 2014-01-29 DIAGNOSIS — E119 Type 2 diabetes mellitus without complications: Secondary | ICD-10-CM | POA: Diagnosis not present

## 2014-01-29 DIAGNOSIS — Z9861 Coronary angioplasty status: Secondary | ICD-10-CM | POA: Diagnosis not present

## 2014-01-29 DIAGNOSIS — Z79899 Other long term (current) drug therapy: Secondary | ICD-10-CM | POA: Diagnosis not present

## 2014-01-29 DIAGNOSIS — N186 End stage renal disease: Secondary | ICD-10-CM

## 2014-01-29 DIAGNOSIS — Z992 Dependence on renal dialysis: Secondary | ICD-10-CM | POA: Diagnosis not present

## 2014-01-29 DIAGNOSIS — I2510 Atherosclerotic heart disease of native coronary artery without angina pectoris: Secondary | ICD-10-CM | POA: Diagnosis not present

## 2014-01-29 DIAGNOSIS — N189 Chronic kidney disease, unspecified: Secondary | ICD-10-CM

## 2014-01-29 DIAGNOSIS — Z794 Long term (current) use of insulin: Secondary | ICD-10-CM | POA: Diagnosis not present

## 2014-01-29 NOTE — Progress Notes (Signed)
Mr [name] denies being on any blood thinner.  Patient reports that he had cardiac test done last year at [facility], by cardiologist Dr Corwin Levins, (from [facility])

## 2014-01-29 NOTE — Progress Notes (Signed)
Referred by:  Chip Boer, PA-C [address] [place], [place] [address]  Reason for referral: New access  History of Present Illness  [name] is a 63 y.o. (07-02-1950) male with PMH of diabetes managed with insulin, poorly controlled hypertension, chronic renal failure since 2008 who presents for evaluation for permanent access.  He was recently presented to the [facility] ED with symptoms of fevers, chills, cough and congestion. At that time his blood work revealed a serum creatinine of 6.8. He was hospitalized for 3 days for management of his worsening renal function. We have been asked by Dr. Lowanda Foster to place a dialysis catheter this week and for permanent access. The patient is right hand dominant.  The patient has not had previous access procedures.    Past Medical History  Diagnosis Date  . Hypertension   . Diabetes mellitus without complication   . Kidney infection   . MI (mitral incompetence)   . CAD (coronary artery disease)     Past Surgical History  Procedure Laterality Date  . Back surgery    . Coronary angioplasty with stent placement  2008  . Spine surgery      History   Social History  . Marital Status: Married    Spouse Name: N/A    Number of Children: N/A  . Years of Education: N/A   Occupational History  . Not on file.   Social History Main Topics  . Smoking status: Never Smoker   . Smokeless tobacco: Former Systems developer    Types: [place] date: 06/14/2010  . Alcohol Use: No  . Drug Use: No  . Sexual Activity: Not on file   Other Topics Concern  . Not on file   Social History Narrative  . No narrative on file    Family History  Problem Relation Age of Onset  . Diabetes Other   . Diabetes Mother   . Hypertension Mother   . Diabetes Father   . Hypertension Father     Current Outpatient Prescriptions on File Prior to Visit  Medication Sig Dispense Refill  . amLODipine (NORVASC) 10 MG tablet Take 1 tablet by mouth daily.      .  calcium acetate (PHOSLO) 667 MG capsule Take 1 capsule (667 mg total) by mouth daily with breakfast.  30 capsule  0  . cloNIDine (CATAPRES) 0.1 MG tablet Take 1 tablet by mouth 3 (three) times daily.      . Cromolyn Sodium (NASAL ALLERGY NA) Place 1 spray into both nostrils at bedtime.      . fenofibrate (TRICOR) 145 MG tablet Take 145 mg by mouth daily.      . furosemide (LASIX) 20 MG tablet Take 3 tablets (60 mg total) by mouth daily.  90 tablet  0  . insulin glargine (LANTUS) 100 UNIT/ML injection Inject 0.8 mLs (80 Units total) into the skin daily.      . metoprolol succinate (TOPROL-XL) 100 MG 24 hr tablet Take 1 tablet by mouth 2 (two) times daily.      . cholecalciferol 1000 UNITS tablet Take 1 tablet (1,000 Units total) by mouth daily.  30 tablet  0   No current facility-administered medications on file prior to visit.    No Known Allergies   Physical Examination  Filed Vitals:   01/29/14 1517  BP: 193/99  Pulse: 80  Resp: 16  Height: 6\' 5"  (1.956 m)  Weight: 214 lb (97.07 kg)  SpO2: 100%  Body mass index is 25.37 kg/(m^2).  General: A&O x 3, WDWN male in NAD  Head: [place]/AT  Pulmonary: Sym exp, good air movt, CTAB, no rales, rhonchi, & wheezing  Cardiac: RRR, Nl S1, S2, no Murmurs, rubs or gallops  Vascular: Vessel Right Left  Radial Palpable Palpable  Ulnar Palpable Palpable  Brachial Palpable Palpable    Musculoskeletal: Extremities without ischemic changes   Neurologic:  Pain and light touch intact in extremities   Psychiatric: Judgment intact, Mood & affect appropriate for pt's clinical situation  Non-Invasive Vascular Imaging  Vein Mapping  (Date: 01/29/2014):   R arm: cephalic [id number]  L arm: cephalic vein is [id number] cm between the proximal forearm and antecubital fossa but is 0.2 cm or less proximally. Basilic vein 0000000 cm.   Medical Decision Making  Balthazar Dupell is a 63 y.o. male who presents with ESRD requiring hemodialysis.    Based on vein mapping and examination, this patient's permanent access options include: left upper arm arteriovenous graft. He has small vein conduits bilaterally.    Had an extensive discussion with this patient in regards to the nature of access surgery, including risk, benefits, and alternatives.     The patient is aware that the risks of access surgery include but are not limited to: bleeding, infection, steal syndrome, nerve damage, ischemic monomelic neuropathy, failure of access to mature, and possible need for additional access procedures in the future.  The patient has agreed to proceed with the above procedure which will be scheduled for tomorrow 01/30/14 for tunneled dialysis catheter and left upper arm arteriovenous graft placement.  Virgina Jock, PA-C [facility] Office: [phone] Pager: [phone]  01/29/2014, 4:13 PM  This patient was seen in conjunction with Dr. Donnetta Hutching  I have examined the patient, reviewed and agree with above. Patient has very small surface veins both by physical exam and by ultrasound. I images veins myself with SonoSite and these are indeed unusable for fistula here in the cephalic or basilic veins. I have recommended hemodialysis catheter as requested by his nephrologist this week and have recommended Gore-Tex graft placement in the same setting in the same anesthesia. He understands this and will proceed with surgery this week for hemodialysis catheter and left arm AV Gore-Tex graft  Oluwakemi Salsberry, MD 01/29/2014 4:48 PM

## 2014-01-29 NOTE — Progress Notes (Signed)
01/29/14 1820  OBSTRUCTIVE SLEEP APNEA  Have you ever been diagnosed with sleep apnea through a sleep study? No  Do you snore loudly (loud enough to be heard through closed doors)?  0  Do you often feel tired, fatigued, or sleepy during the daytime? 1  Has anyone observed you stop breathing during your sleep? 0  Do you have, or are you being treated for high blood pressure? 1  BMI more than 35 kg/m2? 0  Age over 62 years old? 1  Neck circumference greater than 40 cm/16 inches? 1 (17)  Gender: 1  Obstructive Sleep Apnea Score 5

## 2014-01-30 ENCOUNTER — Encounter (HOSPITAL_COMMUNITY)
Admission: RE | Disposition: A | Discharge status: Home / Self Care | Payer: Self-pay | Source: Ambulatory Visit | Attending: Vascular Surgery

## 2014-01-30 ENCOUNTER — Ambulatory Visit (HOSPITAL_COMMUNITY): Payer: Medicare Other | Admitting: Certified Registered"

## 2014-01-30 ENCOUNTER — Encounter (HOSPITAL_COMMUNITY): Payer: Self-pay | Admitting: *Deleted

## 2014-01-30 ENCOUNTER — Encounter (HOSPITAL_COMMUNITY): Payer: Medicare Other | Admitting: Certified Registered"

## 2014-01-30 ENCOUNTER — Ambulatory Visit (HOSPITAL_COMMUNITY): Payer: Medicare Other

## 2014-01-30 ENCOUNTER — Other Ambulatory Visit: Payer: Self-pay | Admitting: *Deleted

## 2014-01-30 ENCOUNTER — Ambulatory Visit (HOSPITAL_COMMUNITY)
Admission: RE | Admit: 2014-01-30 | Discharge: 2014-01-30 | Disposition: A | Discharge status: Home / Self Care | Payer: Medicare Other | Source: Ambulatory Visit | Attending: Vascular Surgery | Admitting: Vascular Surgery

## 2014-01-30 DIAGNOSIS — F329 Major depressive disorder, single episode, unspecified: Secondary | ICD-10-CM | POA: Diagnosis not present

## 2014-01-30 DIAGNOSIS — N186 End stage renal disease: Secondary | ICD-10-CM | POA: Insufficient documentation

## 2014-01-30 DIAGNOSIS — N179 Acute kidney failure, unspecified: Secondary | ICD-10-CM

## 2014-01-30 DIAGNOSIS — Z992 Dependence on renal dialysis: Secondary | ICD-10-CM | POA: Insufficient documentation

## 2014-01-30 DIAGNOSIS — E119 Type 2 diabetes mellitus without complications: Secondary | ICD-10-CM | POA: Diagnosis not present

## 2014-01-30 DIAGNOSIS — I2510 Atherosclerotic heart disease of native coronary artery without angina pectoris: Secondary | ICD-10-CM | POA: Insufficient documentation

## 2014-01-30 DIAGNOSIS — Z79899 Other long term (current) drug therapy: Secondary | ICD-10-CM | POA: Insufficient documentation

## 2014-01-30 DIAGNOSIS — F3289 Other specified depressive episodes: Secondary | ICD-10-CM | POA: Diagnosis not present

## 2014-01-30 DIAGNOSIS — I120 Hypertensive chronic kidney disease with stage 5 chronic kidney disease or end stage renal disease: Secondary | ICD-10-CM | POA: Diagnosis not present

## 2014-01-30 DIAGNOSIS — N189 Chronic kidney disease, unspecified: Secondary | ICD-10-CM

## 2014-01-30 DIAGNOSIS — Z9861 Coronary angioplasty status: Secondary | ICD-10-CM | POA: Insufficient documentation

## 2014-01-30 DIAGNOSIS — N185 Chronic kidney disease, stage 5: Secondary | ICD-10-CM

## 2014-01-30 DIAGNOSIS — Z794 Long term (current) use of insulin: Secondary | ICD-10-CM | POA: Insufficient documentation

## 2014-01-30 HISTORY — DX: Other seasonal allergic rhinitis: J30.2

## 2014-01-30 HISTORY — PX: INSERTION OF DIALYSIS CATHETER: SHX1324

## 2014-01-30 HISTORY — DX: Unspecified osteoarthritis, unspecified site: M19.90

## 2014-01-30 HISTORY — PX: AV FISTULA PLACEMENT: SHX1204

## 2014-01-30 HISTORY — DX: Personal history of urinary calculi: Z87.442

## 2014-01-30 HISTORY — DX: Major depressive disorder, single episode, unspecified: F32.9

## 2014-01-30 HISTORY — DX: Depression, unspecified: F32.A

## 2014-01-30 LAB — POCT I-STAT 4, (NA,K, GLUC, HGB,HCT)
Glucose, Bld: 69 mg/dL — ABNORMAL LOW (ref 70–99)
HCT: 34 % — ABNORMAL LOW (ref 39.0–52.0)
Hemoglobin: 11.6 g/dL — ABNORMAL LOW (ref 13.0–17.0)
Potassium: 4.3 mEq/L (ref 3.7–5.3)
Sodium: 140 mEq/L (ref 137–147)

## 2014-01-30 LAB — GLUCOSE, CAPILLARY: Glucose-Capillary: 112 mg/dL — ABNORMAL HIGH (ref 70–99)

## 2014-01-30 SURGERY — INSERTION OF DIALYSIS CATHETER
Anesthesia: Monitor Anesthesia Care | Site: Arm Upper | Laterality: Right

## 2014-01-30 MED ORDER — PROPOFOL 10 MG/ML IV BOLUS
INTRAVENOUS | Status: AC
  Filled 2014-01-30: qty 20

## 2014-01-30 MED ORDER — SODIUM CHLORIDE 0.9 % IV SOLN
INTRAVENOUS | Status: DC
  Administered 2014-01-30: 20 mL/h via INTRAVENOUS

## 2014-01-30 MED ORDER — DEXTROSE 5 % IV SOLN
1.5000 g | INTRAVENOUS | Status: AC
  Administered 2014-01-30: 1.5 g via INTRAVENOUS
  Filled 2014-01-30: qty 1.5

## 2014-01-30 MED ORDER — ROCURONIUM BROMIDE 50 MG/5ML IV SOLN
INTRAVENOUS | Status: AC
  Filled 2014-01-30: qty 1

## 2014-01-30 MED ORDER — TRAMADOL HCL 50 MG PO TABS: 50.0000 mg | ORAL_TABLET | Freq: Four times a day (QID) | ORAL | Status: DC | PRN

## 2014-01-30 MED ORDER — CHLORHEXIDINE GLUCONATE CLOTH 2 % EX PADS: 6.0000 | MEDICATED_PAD | Freq: Once | CUTANEOUS | Status: DC

## 2014-01-30 MED ORDER — LIDOCAINE HCL (CARDIAC) 20 MG/ML IV SOLN
INTRAVENOUS | Status: AC
  Filled 2014-01-30: qty 5

## 2014-01-30 MED ORDER — ARTIFICIAL TEARS OP OINT
TOPICAL_OINTMENT | OPHTHALMIC | Status: AC
  Filled 2014-01-30: qty 3.5

## 2014-01-30 MED ORDER — HEPARIN SODIUM (PORCINE) 1000 UNIT/ML IJ SOLN
INTRAMUSCULAR | Status: DC | PRN
  Administered 2014-01-30: 8000 [IU] via INTRAVENOUS

## 2014-01-30 MED ORDER — LIDOCAINE HCL (PF) 1 % IJ SOLN
INTRAMUSCULAR | Status: AC
  Filled 2014-01-30: qty 30

## 2014-01-30 MED ORDER — LIDOCAINE HCL (PF) 1 % IJ SOLN
INTRAMUSCULAR | Status: DC | PRN
  Administered 2014-01-30 (×2): 30 mL

## 2014-01-30 MED ORDER — HEPARIN SODIUM (PORCINE) 1000 UNIT/ML IJ SOLN
INTRAMUSCULAR | Status: AC
  Filled 2014-01-30: qty 1

## 2014-01-30 MED ORDER — FENTANYL CITRATE 0.05 MG/ML IJ SOLN
INTRAMUSCULAR | Status: AC
  Filled 2014-01-30: qty 5

## 2014-01-30 MED ORDER — ONDANSETRON HCL 4 MG/2ML IJ SOLN
INTRAMUSCULAR | Status: DC | PRN
  Administered 2014-01-30: 4 mg via INTRAVENOUS

## 2014-01-30 MED ORDER — THROMBIN 20000 UNITS EX SOLR
CUTANEOUS | Status: AC
  Filled 2014-01-30: qty 20000

## 2014-01-30 MED ORDER — HEPARIN SODIUM (PORCINE) 1000 UNIT/ML IJ SOLN
INTRAMUSCULAR | Status: DC | PRN
  Administered 2014-01-30: 1000 [IU]

## 2014-01-30 MED ORDER — ONDANSETRON HCL 4 MG/2ML IJ SOLN
INTRAMUSCULAR | Status: AC
  Filled 2014-01-30: qty 2

## 2014-01-30 MED ORDER — 0.9 % SODIUM CHLORIDE (POUR BTL) OPTIME
TOPICAL | Status: DC | PRN
  Administered 2014-01-30: 1000 mL

## 2014-01-30 MED ORDER — PROPOFOL INFUSION 10 MG/ML OPTIME
INTRAVENOUS | Status: DC | PRN
  Administered 2014-01-30: 50 ug/kg/min via INTRAVENOUS

## 2014-01-30 MED ORDER — PROTAMINE SULFATE 10 MG/ML IV SOLN
INTRAVENOUS | Status: DC | PRN
  Administered 2014-01-30: 80 mg via INTRAVENOUS

## 2014-01-30 MED ORDER — DEXTROSE 50 % IV SOLN
INTRAVENOUS | Status: AC
  Administered 2014-01-30: 10:00:00
  Filled 2014-01-30: qty 50

## 2014-01-30 MED ORDER — MIDAZOLAM HCL 2 MG/2ML IJ SOLN
INTRAMUSCULAR | Status: AC
  Filled 2014-01-30: qty 2

## 2014-01-30 MED ORDER — MIDAZOLAM HCL 5 MG/5ML IJ SOLN
INTRAMUSCULAR | Status: DC | PRN
  Administered 2014-01-30: 2 mg via INTRAVENOUS

## 2014-01-30 MED ORDER — SODIUM CHLORIDE 0.9 % IR SOLN
Status: DC | PRN
  Administered 2014-01-30: 12:00:00

## 2014-01-30 MED ORDER — FENTANYL CITRATE 0.05 MG/ML IJ SOLN
INTRAMUSCULAR | Status: DC | PRN
  Administered 2014-01-30: 50 ug via INTRAVENOUS

## 2014-01-30 MED ORDER — PROTAMINE SULFATE 10 MG/ML IV SOLN
INTRAVENOUS | Status: AC
  Filled 2014-01-30: qty 5

## 2014-01-30 MED ORDER — PROTAMINE SULFATE 10 MG/ML IV SOLN
INTRAVENOUS | Status: AC
  Filled 2014-01-30: qty 10

## 2014-01-30 SURGICAL SUPPLY — 56 items
ADH SKN CLS APL DERMABOND .7 (GAUZE/BANDAGES/DRESSINGS) ×6
ARMBAND PINK RESTRICT EXTREMIT (MISCELLANEOUS) ×3 IMPLANT
CANISTER SUCTION 2500CC (MISCELLANEOUS) ×3 IMPLANT
CATH CANNON HEMO 15FR 23CM (HEMODIALYSIS SUPPLIES) ×1 IMPLANT
CHLORAPREP W/TINT 26ML (MISCELLANEOUS) ×4 IMPLANT
CLIP TI MEDIUM 6 (CLIP) ×3 IMPLANT
CLIP TI WIDE RED SMALL 6 (CLIP) ×3 IMPLANT
COVER PROBE W GEL 5X96 (DRAPES) ×1 IMPLANT
COVER SURGICAL LIGHT HANDLE (MISCELLANEOUS) ×3 IMPLANT
DECANTER SPIKE VIAL GLASS SM (MISCELLANEOUS) ×3 IMPLANT
DERMABOND ADVANCED (GAUZE/BANDAGES/DRESSINGS) ×3
DERMABOND ADVANCED .7 DNX12 (GAUZE/BANDAGES/DRESSINGS) ×2 IMPLANT
DRAPE C-ARM 42X72 X-RAY (DRAPES) ×3 IMPLANT
DRAPE CHEST BREAST 15X10 FENES (DRAPES) ×3 IMPLANT
ELECT REM PT RETURN 9FT ADLT (ELECTROSURGICAL) ×3
ELECTRODE REM PT RTRN 9FT ADLT (ELECTROSURGICAL) ×2 IMPLANT
GAUZE SPONGE 2X2 8PLY STRL LF (GAUZE/BANDAGES/DRESSINGS) ×2 IMPLANT
GAUZE SPONGE 4X4 16PLY XRAY LF (GAUZE/BANDAGES/DRESSINGS) ×3 IMPLANT
GLOVE BIO SURGEON STRL SZ7.5 (GLOVE) ×5 IMPLANT
GLOVE BIOGEL PI IND STRL 6.5 (GLOVE) IMPLANT
GLOVE BIOGEL PI IND STRL 7.0 (GLOVE) IMPLANT
GLOVE BIOGEL PI IND STRL 8 (GLOVE) IMPLANT
GLOVE BIOGEL PI INDICATOR 6.5 (GLOVE) ×2
GLOVE BIOGEL PI INDICATOR 7.0 (GLOVE) ×2
GLOVE BIOGEL PI INDICATOR 8 (GLOVE) ×1
GLOVE SURG SS PI 6.5 STRL IVOR (GLOVE) ×2 IMPLANT
GOWN STRL REUS W/ TWL LRG LVL3 (GOWN DISPOSABLE) ×6 IMPLANT
GOWN STRL REUS W/ TWL XL LVL3 (GOWN DISPOSABLE) IMPLANT
GOWN STRL REUS W/TWL LRG LVL3 (GOWN DISPOSABLE) ×12
GOWN STRL REUS W/TWL XL LVL3 (GOWN DISPOSABLE) ×3
GRAFT GORETEX STRT 6X50 (Vascular Products) ×1 IMPLANT
KIT BASIN OR (CUSTOM PROCEDURE TRAY) ×3 IMPLANT
KIT ROOM TURNOVER OR (KITS) ×3 IMPLANT
NDL 18GX1X1/2 (RX/OR ONLY) (NEEDLE) ×2 IMPLANT
NDL HYPO 25GX1X1/2 BEV (NEEDLE) ×2 IMPLANT
NEEDLE 18GX1X1/2 (RX/OR ONLY) (NEEDLE) ×3 IMPLANT
NEEDLE HYPO 25GX1X1/2 BEV (NEEDLE) ×3 IMPLANT
NS IRRIG 1000ML POUR BTL (IV SOLUTION) ×3 IMPLANT
PACK CV ACCESS (CUSTOM PROCEDURE TRAY) ×3 IMPLANT
PAD ARMBOARD 7.5X6 YLW CONV (MISCELLANEOUS) ×6 IMPLANT
SPONGE GAUZE 2X2 STER 10/PKG (GAUZE/BANDAGES/DRESSINGS) ×1
SPONGE SURGIFOAM ABS GEL 100 (HEMOSTASIS) IMPLANT
SUT ETHILON 3 0 PS 1 (SUTURE) ×3 IMPLANT
SUT ETHILON 4 0 PS 2 18 (SUTURE) ×1 IMPLANT
SUT PROLENE 6 0 CC (SUTURE) ×6 IMPLANT
SUT VIC AB 3-0 SH 27 (SUTURE) ×6
SUT VIC AB 3-0 SH 27X BRD (SUTURE) ×4 IMPLANT
SUT VICRYL 4-0 PS2 18IN ABS (SUTURE) ×7 IMPLANT
SYR 20CC LL (SYRINGE) ×5 IMPLANT
SYR 5ML LL (SYRINGE) ×3 IMPLANT
SYRINGE 10CC LL (SYRINGE) ×3 IMPLANT
TAPE CLOTH SURG 4X10 WHT LF (GAUZE/BANDAGES/DRESSINGS) ×1 IMPLANT
TOWEL OR 17X24 6PK STRL BLUE (TOWEL DISPOSABLE) ×3 IMPLANT
TOWEL OR 17X26 10 PK STRL BLUE (TOWEL DISPOSABLE) ×3 IMPLANT
UNDERPAD 30X30 INCONTINENT (UNDERPADS AND DIAPERS) ×3 IMPLANT
WATER STERILE IRR 1000ML POUR (IV SOLUTION) ×3 IMPLANT

## 2014-01-30 NOTE — Op Note (Signed)
Procedure: Ultrasound-guided insertion of Diatek catheter, left upper arm AV graft  Preoperative diagnosis: End-stage renal disease  Postoperative diagnosis: Same  Anesthesia: Local with IV sedation  Operative findings: 23 cm Diatek catheter right internal jugular vein, 6 mm PTFE end to end to axillary vein  Operative details: After obtaining informed consent, the patient was taken to the operating room. The patient was placed in supine position on the operating room table. After adequate sedation the patient's entire neck and chest were prepped and draped in usual sterile fashion. The patient was placed in Trendelenburg position. Ultrasound was used to identify the patient's right internal jugular vein. This had normal compressibility and respiratory variation. Local anesthesia was infiltrated over the right jugular vein.  Using ultrasound guidance, the right internal jugular vein was successfully cannulated.  A 0.035 J-tipped guidewire was threaded into the right internal jugular vein and into the superior vena cava followed by the inferior vena cava under fluoroscopic guidance.   Next sequential 12 and 14 dilators were placed over the guidewire into the right atrium.  A 16 French dilator with a peel-away sheath was then placed over the guidewire into the right atrium.   The guidewire and dilator were removed. A 23 cm Diatek catheter was then placed through the peel away sheath into the right atrium.  The catheter was then tunneled subcutaneously, cut to length, and the hub attached. The catheter was noted to flush and draw easily. The catheter was inspected under fluoroscopy and found with its tip to be in the right atrium without any kinks throughout its course. The catheter was sutured to the skin with nylon sutures. The neck insertion site was closed with Vicryl stitch. The catheter was then loaded with concentrated Heparin solution. A dry sterile dressing was applied.  The patient tolerated this  part of the procedure well and there were no complications.   Next, the left upper extremity was prepped and draped in usual sterile fashion.  A longitudinal incision was then made near the antecubital crease the left arm. The incision was carried into the subcutaneous tissues down to level of the brachial artery.   Next the brachial artery was dissected free in the medial portion incision. The artery was  3-4 mm in diameter. The vessel loops were placed proximal and distal to the planned site of arteriotomy. At this point, a longitudinal incision was made in the axilla and carried through the subcutaneous tissues and fascia to expose the axillary vein.  The nerves were protected.    The vein was approximately 4-5 mm in diameter.  It was dissected free and small side branches ligated with silk ties.  Next, a subcutaneous tunnel was created connecting the upper arm to the lower arm incision in an arcing configuration over the biceps muscle.  A 6 mm PTFE graft was then brought through this subcutaneous tunnel. The patient was given 8000 units of intravenous heparin. After appropriate circulation time, the vessel loops were used to control the artery. A longitudinal opening was made in the left brachial artery.  The end of the graft was beveled and sewn end to side to the artery using a 6 0 prolene.  At completion of the anastomosis the artery was forward bled, backbled and thoroughly flushed.  The anastomosis was secured, vessel loops were released and there was palpable pulse in the graft.  The graft was clamped just above the arterial anastomosis with a fistula clamp. The graft was then pulled taut to length at  the axillary incision.  The axillary vein was controlled with a fine bulldog clamp.  The vein was ligated in the distal armand  Transected.  The distal end of the graft was then beveled and sewn end to end to the vein using a running 6 0 prolene.  Just prior to completion of the anastomosis, everything was  forward bled, back bled and thoroughly flushed.  The anastomosis was secured and the fistula clamp removed from the proximal graft.  A thrill was immediately palpable in the graft. The patient was given 80 mg of protamine to assist with hemostasis.  After hemostasis was obtained, the subcutaneous tissues were reapproximated using a running 3-0 Vicryl suture. The skin was then closed with a 4 0 Vicryl subcuticular stitch. Dermabond was applied to the skin incisions.  The patient tolerated the procedure well and there were no complications.    Instrument sponge and needle counts were correct at the end of the case. The patient was taken to the recovery room in stable condition. Chest x-ray will be obtained in the recovery room.  Ruta Hinds, MD [facility] Office: [phone] Pager: [phone]

## 2014-01-30 NOTE — Transfer of Care (Signed)
Immediate Anesthesia Transfer of Care Note  Patient: [name]  Procedure(s) Performed: Procedure(s): INSERTION OF DIALYSIS CATHETER-RIGHT INTERNAL JUGULAR (Right) INSERTION OF ARTERIOVENOUS (AV) GORE-TEX GRAFT LEFT UPPER ARM (Left)  Patient Location: PACU  Anesthesia Type:MAC  Level of Consciousness: awake, alert  and oriented  Airway & Oxygen Therapy: Patient Spontanous Breathing  Post-op Assessment: Report given to PACU RN and Post -op Vital signs reviewed and stable  Post vital signs: Reviewed and stable  Complications: No apparent anesthesia complications

## 2014-01-30 NOTE — Interval H&P Note (Signed)
History and Physical Interval Note:  01/30/2014 10:49 AM  [name]  has presented today for surgery, with the diagnosis of End stage renal disease   The various methods of treatment have been discussed with the patient and family. After consideration of risks, benefits and other options for treatment, the patient has consented to  Procedure(s): INSERTION OF ARTERIOVENOUS (AV) GORE-TEX GRAFT ARM (Left) INSERTION OF DIALYSIS CATHETER (N/A) as a surgical intervention .  The patient's history has been reviewed, patient examined, no change in status, stable for surgery.  I have reviewed the patient's chart and labs.  Questions were answered to the patient's satisfaction.     Klayten Jolliff E

## 2014-01-30 NOTE — Anesthesia Preprocedure Evaluation (Addendum)
Anesthesia Evaluation  Patient identified by MRN, date of birth, ID band Patient awake    Reviewed: Allergy & Precautions, H&P , NPO status , Patient's Chart, lab work & pertinent test results  History of Anesthesia Complications Negative for: history of anesthetic complications  Airway Mallampati: II TM Distance: >3 FB Neck ROM: Full    Dental  (+) Upper Dentures, Poor Dentition, Missing, Dental Advisory Given   Pulmonary neg pulmonary ROS,    Pulmonary exam normal       Cardiovascular hypertension, Pt. on home beta blockers and Pt. on medications + CAD     Neuro/Psych PSYCHIATRIC DISORDERS Depression negative neurological ROS     GI/Hepatic negative GI ROS, Neg liver ROS,   Endo/Other  diabetes, Insulin Dependent  Renal/GU ESRF and DialysisRenal disease     Musculoskeletal   Abdominal   Peds  Hematology   Anesthesia Other Findings   Reproductive/Obstetrics                          Anesthesia Physical Anesthesia Plan  ASA: III  Anesthesia Plan: MAC   Post-op Pain Management:    Induction: Intravenous  Airway Management Planned: Simple Face Mask  Additional Equipment:   Intra-op Plan:   Post-operative Plan: Extubation in OR  Informed Consent: I have reviewed the patients History and Physical, chart, labs and discussed the procedure including the risks, benefits and alternatives for the proposed anesthesia with the patient or authorized representative who has indicated his/her understanding and acceptance.   Dental advisory given  Plan Discussed with: CRNA, Anesthesiologist and Surgeon  Anesthesia Plan Comments:        Anesthesia Quick Evaluation

## 2014-01-30 NOTE — H&P (View-Only) (Signed)
Referred by:  Chip Boer, PA-C [address] [place], [place] [address]  Reason for referral: New access  History of Present Illness  [name] is a 63 y.o. (Jan 23, 1951) male with PMH of diabetes managed with insulin, poorly controlled hypertension, chronic renal failure since 2008 who presents for evaluation for permanent access.  He was recently presented to the [facility] ED with symptoms of fevers, chills, cough and congestion. At that time his blood work revealed a serum creatinine of 6.8. He was hospitalized for 3 days for management of his worsening renal function. We have been asked by Dr. Lowanda Foster to place a dialysis catheter this week and for permanent access. The patient is right hand dominant.  The patient has not had previous access procedures.    Past Medical History  Diagnosis Date  . Hypertension   . Diabetes mellitus without complication   . Kidney infection   . MI (mitral incompetence)   . CAD (coronary artery disease)     Past Surgical History  Procedure Laterality Date  . Back surgery    . Coronary angioplasty with stent placement  2008  . Spine surgery      History   Social History  . Marital Status: Married    Spouse Name: N/A    Number of Children: N/A  . Years of Education: N/A   Occupational History  . Not on file.   Social History Main Topics  . Smoking status: Never Smoker   . Smokeless tobacco: Former Systems developer    Types: [place] date: 06/14/2010  . Alcohol Use: No  . Drug Use: No  . Sexual Activity: Not on file   Other Topics Concern  . Not on file   Social History Narrative  . No narrative on file    Family History  Problem Relation Age of Onset  . Diabetes Other   . Diabetes Mother   . Hypertension Mother   . Diabetes Father   . Hypertension Father     Current Outpatient Prescriptions on File Prior to Visit  Medication Sig Dispense Refill  . amLODipine (NORVASC) 10 MG tablet Take 1 tablet by mouth daily.      .  calcium acetate (PHOSLO) 667 MG capsule Take 1 capsule (667 mg total) by mouth daily with breakfast.  30 capsule  0  . cloNIDine (CATAPRES) 0.1 MG tablet Take 1 tablet by mouth 3 (three) times daily.      . Cromolyn Sodium (NASAL ALLERGY NA) Place 1 spray into both nostrils at bedtime.      . fenofibrate (TRICOR) 145 MG tablet Take 145 mg by mouth daily.      . furosemide (LASIX) 20 MG tablet Take 3 tablets (60 mg total) by mouth daily.  90 tablet  0  . insulin glargine (LANTUS) 100 UNIT/ML injection Inject 0.8 mLs (80 Units total) into the skin daily.      . metoprolol succinate (TOPROL-XL) 100 MG 24 hr tablet Take 1 tablet by mouth 2 (two) times daily.      . cholecalciferol 1000 UNITS tablet Take 1 tablet (1,000 Units total) by mouth daily.  30 tablet  0   No current facility-administered medications on file prior to visit.    No Known Allergies   Physical Examination  Filed Vitals:   01/29/14 1517  BP: 193/99  Pulse: 80  Resp: 16  Height: 6\' 5"  (1.956 m)  Weight: 214 lb (97.07 kg)  SpO2: 100%  Body mass index is 25.37 kg/(m^2).  General: A&O x 3, WDWN male in NAD  Head: [place]/AT  Pulmonary: Sym exp, good air movt, CTAB, no rales, rhonchi, & wheezing  Cardiac: RRR, Nl S1, S2, no Murmurs, rubs or gallops  Vascular: Vessel Right Left  Radial Palpable Palpable  Ulnar Palpable Palpable  Brachial Palpable Palpable    Musculoskeletal: Extremities without ischemic changes   Neurologic:  Pain and light touch intact in extremities   Psychiatric: Judgment intact, Mood & affect appropriate for pt's clinical situation  Non-Invasive Vascular Imaging  Vein Mapping  (Date: 01/29/2014):   R arm: cephalic [id number]  L arm: cephalic vein is [id number] cm between the proximal forearm and antecubital fossa but is 0.2 cm or less proximally. Basilic vein 0000000 cm.   Medical Decision Making  [name] is a 63 y.o. male who presents with ESRD requiring hemodialysis.    Based on vein mapping and examination, this patient's permanent access options include: left upper arm arteriovenous graft. He has small vein conduits bilaterally.    Had an extensive discussion with this patient in regards to the nature of access surgery, including risk, benefits, and alternatives.     The patient is aware that the risks of access surgery include but are not limited to: bleeding, infection, steal syndrome, nerve damage, ischemic monomelic neuropathy, failure of access to mature, and possible need for additional access procedures in the future.  The patient has agreed to proceed with the above procedure which will be scheduled for tomorrow 01/30/14 for tunneled dialysis catheter and left upper arm arteriovenous graft placement.  Virgina Jock, PA-C [facility] Office: [phone] Pager: [phone]  01/29/2014, 4:13 PM  This patient was seen in conjunction with Dr. Donnetta Hutching  I have examined the patient, reviewed and agree with above. Patient has very small surface veins both by physical exam and by ultrasound. I images veins myself with SonoSite and these are indeed unusable for fistula here in the cephalic or basilic veins. I have recommended hemodialysis catheter as requested by his nephrologist this week and have recommended Gore-Tex graft placement in the same setting in the same anesthesia. He understands this and will proceed with surgery this week for hemodialysis catheter and left arm AV Gore-Tex graft  Ysenia Filice, MD 01/29/2014 4:48 PM

## 2014-01-30 NOTE — Discharge Instructions (Signed)

## 2014-01-30 NOTE — Anesthesia Postprocedure Evaluation (Signed)
Anesthesia Post Note  Patient: [name]  Procedure(s) Performed: Procedure(s) (LRB): INSERTION OF DIALYSIS CATHETER-RIGHT INTERNAL JUGULAR (Right) INSERTION OF ARTERIOVENOUS (AV) GORE-TEX GRAFT LEFT UPPER ARM (Left)  Anesthesia type: MAC  Patient location: PACU  Post pain: Pain level controlled  Post assessment: Patient's Cardiovascular Status Stable  Last Vitals:  Filed Vitals:   01/30/14 1451  BP: 202/76  Pulse: 88  Temp:   Resp: 15    Post vital signs: Reviewed and stable  Level of consciousness: sedated  Complications: No apparent anesthesia complications

## 2014-01-31 ENCOUNTER — Encounter (HOSPITAL_COMMUNITY): Payer: Self-pay | Admitting: Vascular Surgery

## 2014-01-31 ENCOUNTER — Telehealth: Payer: Self-pay | Admitting: Vascular Surgery

## 2014-01-31 NOTE — Telephone Encounter (Signed)
Spoke with pt to schedule, dpm °

## 2014-01-31 NOTE — Telephone Encounter (Signed)
Message copied by Gena Fray on Thu Jan 31, 2014 10:55 AM ------      Message from: Peter Minium K      Created: Wed Jan 30, 2014  2:13 PM      Regarding: Schedule                   ----- Message -----         From: Elam Dutch, MD         Sent: 01/30/2014   1:57 PM           To: [facility] Charge Pool            Left upper arm graft      Ultrasound, diatek            Needs follow up in 1 month            No asst            Ruta Hinds ------

## 2014-02-05 DIAGNOSIS — R52 Pain, unspecified: Secondary | ICD-10-CM | POA: Insufficient documentation

## 2014-02-05 DIAGNOSIS — D509 Iron deficiency anemia, unspecified: Secondary | ICD-10-CM | POA: Insufficient documentation

## 2014-02-05 DIAGNOSIS — D696 Thrombocytopenia, unspecified: Secondary | ICD-10-CM | POA: Insufficient documentation

## 2014-02-05 DIAGNOSIS — N2581 Secondary hyperparathyroidism of renal origin: Secondary | ICD-10-CM | POA: Insufficient documentation

## 2014-02-05 DIAGNOSIS — E8809 Other disorders of plasma-protein metabolism, not elsewhere classified: Secondary | ICD-10-CM | POA: Insufficient documentation

## 2014-02-05 DIAGNOSIS — Z4931 Encounter for adequacy testing for hemodialysis: Secondary | ICD-10-CM | POA: Insufficient documentation

## 2014-02-05 DIAGNOSIS — E162 Hypoglycemia, unspecified: Secondary | ICD-10-CM | POA: Insufficient documentation

## 2014-02-05 DIAGNOSIS — E440 Moderate protein-calorie malnutrition: Secondary | ICD-10-CM | POA: Insufficient documentation

## 2014-02-05 DIAGNOSIS — N186 End stage renal disease: Secondary | ICD-10-CM | POA: Insufficient documentation

## 2014-02-05 DIAGNOSIS — L299 Pruritus, unspecified: Secondary | ICD-10-CM | POA: Insufficient documentation

## 2014-02-05 DIAGNOSIS — R0602 Shortness of breath: Secondary | ICD-10-CM | POA: Diagnosis present

## 2014-02-05 DIAGNOSIS — D689 Coagulation defect, unspecified: Secondary | ICD-10-CM | POA: Insufficient documentation

## 2014-02-08 DIAGNOSIS — Z111 Encounter for screening for respiratory tuberculosis: Secondary | ICD-10-CM | POA: Insufficient documentation

## 2014-02-08 DIAGNOSIS — Z01810 Encounter for preprocedural cardiovascular examination: Secondary | ICD-10-CM | POA: Insufficient documentation

## 2014-03-06 ENCOUNTER — Encounter: Payer: Self-pay | Admitting: Vascular Surgery

## 2014-03-07 ENCOUNTER — Encounter: Payer: Self-pay | Admitting: Vascular Surgery

## 2014-03-07 ENCOUNTER — Ambulatory Visit (INDEPENDENT_AMBULATORY_CARE_PROVIDER_SITE_OTHER): Payer: Medicare Other | Admitting: Vascular Surgery

## 2014-03-07 VITALS — BP 193/84 | HR 87 | Ht 77.0 in | Wt 213.9 lb

## 2014-03-07 DIAGNOSIS — N186 End stage renal disease: Secondary | ICD-10-CM

## 2014-03-07 NOTE — Progress Notes (Signed)
Patient is a 63 year old male who returns for followup today. He had placement of a left upper arm AV graft on August 19. He also had a Diatek catheter placed at that time. He states his catheter is functioning well. They have not cannulated his graft yet.  He denies any numbness or tingling in his left hand.  Physical exam:  Filed Vitals:   03/07/14 1209  BP: 193/84  Pulse: 87  Height: 6\' 5"  (1.956 m)  Weight: 213 lb 14.4 oz (97.024 kg)  SpO2: 100%    Left upper extremity: No palpable radial pulse audible bruit palpable thrill in graft, incisions well-healed  Assessment: Functioning left upper arm AV graft no evidence of steal should be able to cannulate at this point followup as needed  Ruta Hinds, MD [facility] Office: [phone] Pager: [phone]

## 2014-08-21 DIAGNOSIS — E8770 Fluid overload, unspecified: Secondary | ICD-10-CM | POA: Diagnosis present

## 2014-11-07 ENCOUNTER — Encounter (HOSPITAL_COMMUNITY): Payer: Self-pay

## 2014-11-07 ENCOUNTER — Emergency Department (HOSPITAL_COMMUNITY)
Admission: EM | Admit: 2014-11-07 | Discharge: 2014-11-07 | Disposition: A | Discharge status: Home / Self Care | Payer: Medicare Other | Attending: Emergency Medicine | Admitting: Emergency Medicine

## 2014-11-07 DIAGNOSIS — Z7951 Long term (current) use of inhaled steroids: Secondary | ICD-10-CM | POA: Insufficient documentation

## 2014-11-07 DIAGNOSIS — E119 Type 2 diabetes mellitus without complications: Secondary | ICD-10-CM | POA: Insufficient documentation

## 2014-11-07 DIAGNOSIS — Z8709 Personal history of other diseases of the respiratory system: Secondary | ICD-10-CM | POA: Diagnosis not present

## 2014-11-07 DIAGNOSIS — Z8739 Personal history of other diseases of the musculoskeletal system and connective tissue: Secondary | ICD-10-CM | POA: Diagnosis not present

## 2014-11-07 DIAGNOSIS — Z87448 Personal history of other diseases of urinary system: Secondary | ICD-10-CM | POA: Diagnosis not present

## 2014-11-07 DIAGNOSIS — Z794 Long term (current) use of insulin: Secondary | ICD-10-CM | POA: Insufficient documentation

## 2014-11-07 DIAGNOSIS — I2510 Atherosclerotic heart disease of native coronary artery without angina pectoris: Secondary | ICD-10-CM | POA: Insufficient documentation

## 2014-11-07 DIAGNOSIS — Z87442 Personal history of urinary calculi: Secondary | ICD-10-CM | POA: Insufficient documentation

## 2014-11-07 DIAGNOSIS — Z9861 Coronary angioplasty status: Secondary | ICD-10-CM | POA: Diagnosis not present

## 2014-11-07 DIAGNOSIS — Z79899 Other long term (current) drug therapy: Secondary | ICD-10-CM | POA: Insufficient documentation

## 2014-11-07 DIAGNOSIS — F329 Major depressive disorder, single episode, unspecified: Secondary | ICD-10-CM | POA: Insufficient documentation

## 2014-11-07 DIAGNOSIS — I10 Essential (primary) hypertension: Secondary | ICD-10-CM | POA: Diagnosis not present

## 2014-11-07 MED ORDER — METOPROLOL SUCCINATE ER 100 MG PO TB24: 100.0000 mg | ORAL_TABLET | Freq: Two times a day (BID) | ORAL | Status: DC

## 2014-11-07 NOTE — ED Notes (Addendum)
Pt reports is a dialysis pt and reports his bp has been high for the past 3 weeks.  Reports has been out of his metoprolol for the past 3 weeks.  Reports  Numbness in left index finger and feeling "off balance."

## 2014-11-07 NOTE — Discharge Instructions (Signed)
Follow up with your md in one month

## 2014-11-07 NOTE — ED Provider Notes (Signed)
CSN: [id number]     Arrival date & time 11/07/14  [id number] History  This chart was scribed for Milton Ferguson, MD by Girtha Hake, ED Scribe. The patient was seen in APA11/APA11. The patient's care was started at 9:05 AM.     Chief Complaint  Patient presents with  . Hypertension   Patient is a 64 y.o. male presenting with hypertension. The history is provided by the patient. No language interpreter was used.  Hypertension This is a chronic problem. The current episode started more than 1 week ago (3 weeks). The problem occurs constantly. The problem has not changed since onset.Pertinent negatives include no chest pain, no abdominal pain, no headaches and no shortness of breath. Nothing aggravates the symptoms. Nothing relieves the symptoms. He has tried nothing for the symptoms.   HPI Comments: [name] is a 64 y.o. male with a history of MI, DM, and HTN who presents to the Emergency Department complaining of HTN beginning 3 weeks ago. Patient also complains of fatigue. Patient reports that he ran out of his metoprolol 3 weeks ago. He reports that he was taking metoprolol BID, but in March it was only refilled for him to take it QD. He continued to take it BID and ran out of it before he could get it refilled.  PCP is Dr. Shawn Stall.   Past Medical History  Diagnosis Date  . Hypertension   . Diabetes mellitus without complication   . Kidney infection   . MI (mitral incompetence)   . CAD (coronary artery disease)   . Seasonal allergies   . Depression   . History of kidney stones   . Arthritis    Past Surgical History  Procedure Laterality Date  . Back surgery    . Coronary angioplasty with stent placement  2008  . Spine surgery    . Eye surgery Right     lens implant  . Colonoscopy w/ polypectomy    . Insertion of dialysis catheter Right 01/30/2014    Procedure: INSERTION OF DIALYSIS CATHETER-RIGHT INTERNAL JUGULAR;  Surgeon: Elam Dutch, MD;  Location: [facility];  Service:  Vascular;  Laterality: Right;  . Av fistula placement Left 01/30/2014    Procedure: INSERTION OF ARTERIOVENOUS (AV) GORE-TEX GRAFT LEFT UPPER ARM;  Surgeon: Elam Dutch, MD;  [facility];  Service: Vascular;  Laterality: Left;   Family History  Problem Relation Age of Onset  . Diabetes Other   . Diabetes Mother   . Hypertension Mother   . Diabetes Father   . Hypertension Father    History  Substance Use Topics  . Smoking status: Never Smoker   . Smokeless tobacco: Former Systems developer    Types: [place] date: 06/14/2010  . Alcohol Use: No    Review of Systems  Constitutional: Positive for fatigue. Negative for appetite change.  HENT: Negative for congestion, ear discharge and sinus pressure.   Eyes: Negative for discharge.  Respiratory: Negative for cough and shortness of breath.   Cardiovascular: Negative for chest pain.  Gastrointestinal: Negative for abdominal pain and diarrhea.  Genitourinary: Negative for frequency and hematuria.  Musculoskeletal: Negative for back pain.  Skin: Negative for rash.  Neurological: Negative for seizures and headaches.  Psychiatric/Behavioral: Negative for hallucinations.      Allergies  Shellfish allergy  Home Medications   Prior to Admission medications   Medication Sig Start Date End Date Taking? Authorizing Provider  amLODipine (NORVASC) 10 MG tablet Take 10 mg  by mouth daily.  01/13/14   Historical Provider, MD  calcium acetate (PHOSLO) 667 MG capsule Take 667 mg by mouth daily.    Historical Provider, MD  Cholecalciferol 1000 UNITS tablet Take 1,000 Units by mouth daily.    Historical Provider, MD  cloNIDine (CATAPRES) 0.1 MG tablet Take 0.1 mg by mouth 3 (three) times daily.  11/07/13   Historical Provider, MD  fenofibrate (TRICOR) 145 MG tablet Take 145 mg by mouth daily.    Historical Provider, MD  fluticasone (FLONASE) 50 MCG/ACT nasal spray Place 2 sprays into both nostrils daily as needed for rhinitis.     Historical  Provider, MD  furosemide (LASIX) 20 MG tablet Take 60 mg by mouth daily.    Historical Provider, MD  insulin glargine (LANTUS) 100 UNIT/ML injection Inject 80 Units into the skin at bedtime.    Historical Provider, MD  metoprolol succinate (TOPROL-XL) 100 MG 24 hr tablet Take 100 mg by mouth 2 (two) times daily.  12/12/13   Historical Provider, MD  Omega-3 Fatty Acids (FISH OIL) 1000 MG CAPS Take 2,000 mg by mouth daily.    Historical Provider, MD  traMADol (ULTRAM) 50 MG tablet Take 1 tablet (50 mg total) by mouth every 6 (six) hours as needed. 01/30/14   Elam Dutch, MD   Triage Vitals: BP 210/105 mmHg  Pulse 96  Temp(Src) 97.9 F (36.6 C) (Oral)  Resp 18  Ht 6\' 5"  (1.956 m)  Wt 214 lb (97.07 kg)  BMI 25.37 kg/m2  SpO2 100% Physical Exam  Constitutional: He is oriented to person, place, and time. He appears well-developed.  HENT:  Head: Normocephalic.  Eyes: Conjunctivae and EOM are normal. No scleral icterus.  Neck: Neck supple. No thyromegaly present.  Cardiovascular: Normal rate and regular rhythm.  Exam reveals no gallop and no friction rub.   No murmur heard. Pulmonary/Chest: No stridor. He has no wheezes. He has no rales. He exhibits no tenderness.  Abdominal: He exhibits no distension. There is no tenderness. There is no rebound.  Musculoskeletal: Normal range of motion. He exhibits no edema.  Lymphadenopathy:    He has no cervical adenopathy.  Neurological: He is oriented to person, place, and time. He exhibits normal muscle tone. Coordination normal.  Skin: No rash noted. No erythema.  Psychiatric: He has a normal mood and affect. His behavior is normal.  Nursing note and vitals reviewed.   ED Course  Procedures (including critical care time) DIAGNOSTIC STUDIES: Oxygen Saturation is 100% on room air, normal by my interpretation.    COORDINATION OF CARE:    Labs Review Labs Reviewed - No data to display  Imaging Review No results found.   EKG  Interpretation None      MDM   Final diagnoses:  None   Htn,  Will rx his metoprolol again,   The chart was scribed for me under my direct supervision.  I personally performed the history, physical, and medical decision making and all procedures in the evaluation of this patient.Milton Ferguson, MD 11/07/14 [phone]

## 2015-03-21 ENCOUNTER — Encounter (HOSPITAL_COMMUNITY): Payer: Self-pay

## 2015-03-21 ENCOUNTER — Emergency Department (HOSPITAL_COMMUNITY): Payer: Medicare Other

## 2015-03-21 ENCOUNTER — Observation Stay (HOSPITAL_COMMUNITY)
Admission: EM | Admit: 2015-03-21 | Discharge: 2015-03-21 | Disposition: A | Discharge status: Home / Self Care | Payer: Medicare Other | Attending: Internal Medicine | Admitting: Internal Medicine

## 2015-03-21 DIAGNOSIS — F329 Major depressive disorder, single episode, unspecified: Secondary | ICD-10-CM | POA: Diagnosis not present

## 2015-03-21 DIAGNOSIS — N189 Chronic kidney disease, unspecified: Secondary | ICD-10-CM | POA: Diagnosis present

## 2015-03-21 DIAGNOSIS — N159 Renal tubulo-interstitial disease, unspecified: Secondary | ICD-10-CM | POA: Insufficient documentation

## 2015-03-21 DIAGNOSIS — I340 Nonrheumatic mitral (valve) insufficiency: Secondary | ICD-10-CM | POA: Diagnosis not present

## 2015-03-21 DIAGNOSIS — N186 End stage renal disease: Secondary | ICD-10-CM | POA: Diagnosis present

## 2015-03-21 DIAGNOSIS — R0902 Hypoxemia: Principal | ICD-10-CM | POA: Diagnosis present

## 2015-03-21 DIAGNOSIS — J9621 Acute and chronic respiratory failure with hypoxia: Secondary | ICD-10-CM

## 2015-03-21 DIAGNOSIS — R05 Cough: Secondary | ICD-10-CM | POA: Diagnosis not present

## 2015-03-21 DIAGNOSIS — I10 Essential (primary) hypertension: Secondary | ICD-10-CM | POA: Diagnosis not present

## 2015-03-21 DIAGNOSIS — E119 Type 2 diabetes mellitus without complications: Secondary | ICD-10-CM | POA: Diagnosis present

## 2015-03-21 DIAGNOSIS — Z79899 Other long term (current) drug therapy: Secondary | ICD-10-CM | POA: Insufficient documentation

## 2015-03-21 DIAGNOSIS — E8779 Other fluid overload: Secondary | ICD-10-CM

## 2015-03-21 DIAGNOSIS — R0789 Other chest pain: Secondary | ICD-10-CM | POA: Diagnosis present

## 2015-03-21 DIAGNOSIS — I252 Old myocardial infarction: Secondary | ICD-10-CM | POA: Insufficient documentation

## 2015-03-21 DIAGNOSIS — D638 Anemia in other chronic diseases classified elsewhere: Secondary | ICD-10-CM | POA: Diagnosis not present

## 2015-03-21 DIAGNOSIS — R0602 Shortness of breath: Secondary | ICD-10-CM | POA: Diagnosis not present

## 2015-03-21 DIAGNOSIS — E8770 Fluid overload, unspecified: Secondary | ICD-10-CM | POA: Diagnosis present

## 2015-03-21 DIAGNOSIS — J810 Acute pulmonary edema: Secondary | ICD-10-CM

## 2015-03-21 DIAGNOSIS — D631 Anemia in chronic kidney disease: Secondary | ICD-10-CM | POA: Diagnosis present

## 2015-03-21 DIAGNOSIS — Z794 Long term (current) use of insulin: Secondary | ICD-10-CM | POA: Diagnosis not present

## 2015-03-21 DIAGNOSIS — E1121 Type 2 diabetes mellitus with diabetic nephropathy: Secondary | ICD-10-CM | POA: Diagnosis present

## 2015-03-21 DIAGNOSIS — E871 Hypo-osmolality and hyponatremia: Secondary | ICD-10-CM | POA: Diagnosis present

## 2015-03-21 DIAGNOSIS — M1990 Unspecified osteoarthritis, unspecified site: Secondary | ICD-10-CM | POA: Diagnosis not present

## 2015-03-21 DIAGNOSIS — I2510 Atherosclerotic heart disease of native coronary artery without angina pectoris: Secondary | ICD-10-CM | POA: Diagnosis not present

## 2015-03-21 HISTORY — DX: Acute myocardial infarction, unspecified: I21.9

## 2015-03-21 LAB — BASIC METABOLIC PANEL
Anion gap: 16 — ABNORMAL HIGH (ref 5–15)
BUN: 99 mg/dL — ABNORMAL HIGH (ref 6–20)
CO2: 24 mmol/L (ref 22–32)
Calcium: 8.2 mg/dL — ABNORMAL LOW (ref 8.9–10.3)
Chloride: 94 mmol/L — ABNORMAL LOW (ref 101–111)
Creatinine, Ser: 16.26 mg/dL — ABNORMAL HIGH (ref 0.61–1.24)
GFR calc Af Amer: 3 mL/min — ABNORMAL LOW (ref >60)
GFR calc non Af Amer: 3 mL/min — ABNORMAL LOW (ref >60)
Glucose, Bld: 211 mg/dL — ABNORMAL HIGH (ref 65–99)
Potassium: 3.9 mmol/L (ref 3.5–5.1)
Sodium: 134 mmol/L — ABNORMAL LOW (ref 135–145)

## 2015-03-21 LAB — CBC WITH DIFFERENTIAL/PLATELET
Basophils Absolute: 0 10*3/uL (ref 0.0–0.1)
Basophils Relative: 0 %
Eosinophils Absolute: 0.3 10*3/uL (ref 0.0–0.7)
Eosinophils Relative: 4 %
HCT: 27 % — ABNORMAL LOW (ref 39.0–52.0)
Hemoglobin: 9.5 g/dL — ABNORMAL LOW (ref 13.0–17.0)
Lymphocytes Relative: 14 %
Lymphs Abs: 1.2 10*3/uL (ref 0.7–4.0)
MCH: 33.3 pg (ref 26.0–34.0)
MCHC: 35.2 g/dL (ref 30.0–36.0)
MCV: 94.7 fL (ref 78.0–100.0)
Monocytes Absolute: 0.4 10*3/uL (ref 0.1–1.0)
Monocytes Relative: 5 %
Neutro Abs: 6.8 10*3/uL (ref 1.7–7.7)
Neutrophils Relative %: 77 %
Platelets: 221 10*3/uL (ref 150–400)
RBC: 2.85 MIL/uL — ABNORMAL LOW (ref 4.22–5.81)
RDW: 13.6 % (ref 11.5–15.5)
WBC: 8.7 10*3/uL (ref 4.0–10.5)

## 2015-03-21 LAB — GLUCOSE, CAPILLARY
Glucose-Capillary: 249 mg/dL — ABNORMAL HIGH (ref 65–99)
Glucose-Capillary: 100 mg/dL — ABNORMAL HIGH (ref 65–99)

## 2015-03-21 LAB — BRAIN NATRIURETIC PEPTIDE: B Natriuretic Peptide: 569 pg/mL — ABNORMAL HIGH (ref 0.0–100.0)

## 2015-03-21 LAB — TROPONIN I: Troponin I: <0.03 ng/mL (ref <0.031)

## 2015-03-21 MED ORDER — INSULIN GLARGINE 100 UNIT/ML ~~LOC~~ SOLN
40.0000 [IU] | Freq: Every day | SUBCUTANEOUS | Status: DC
  Filled 2015-03-21 (×3): qty 0.4

## 2015-03-21 MED ORDER — LIDOCAINE-PRILOCAINE 2.5-2.5 % EX CREA: 1.0000 "application " | TOPICAL_CREAM | CUTANEOUS | Status: DC | PRN

## 2015-03-21 MED ORDER — LIDOCAINE HCL (PF) 1 % IJ SOLN
5.0000 mL | INTRAMUSCULAR | Status: DC | PRN
Start: 2015-03-21 — End: 2015-03-21

## 2015-03-21 MED ORDER — SODIUM CHLORIDE 0.9 % IV SOLN: 100.0000 mL | INTRAVENOUS | Status: DC | PRN

## 2015-03-21 MED ORDER — FUROSEMIDE 10 MG/ML IJ SOLN
120.0000 mg | Freq: Once | INTRAVENOUS | Status: AC
  Administered 2015-03-21: 120 mg via INTRAVENOUS
  Filled 2015-03-21: qty 12

## 2015-03-21 MED ORDER — FUROSEMIDE 10 MG/ML IJ SOLN
INTRAMUSCULAR | Status: AC
  Filled 2015-03-21: qty 20

## 2015-03-21 MED ORDER — HEPARIN SODIUM (PORCINE) 1000 UNIT/ML DIALYSIS
1000.0000 [IU] | INTRAMUSCULAR | Status: DC | PRN
  Filled 2015-03-21: qty 1

## 2015-03-21 MED ORDER — INSULIN ASPART 100 UNIT/ML ~~LOC~~ SOLN
0.0000 [IU] | Freq: Three times a day (TID) | SUBCUTANEOUS | Status: DC
  Administered 2015-03-21: 3 [IU] via SUBCUTANEOUS

## 2015-03-21 MED ORDER — CARVEDILOL 12.5 MG PO TABS
25.0000 mg | ORAL_TABLET | Freq: Two times a day (BID) | ORAL | Status: DC
  Administered 2015-03-21: 25 mg via ORAL
  Filled 2015-03-21: qty 2

## 2015-03-21 MED ORDER — LOSARTAN POTASSIUM 100 MG PO TABS: 100.0000 mg | ORAL_TABLET | Freq: Every day | ORAL | Status: DC

## 2015-03-21 MED ORDER — CARVEDILOL 25 MG PO TABS: 25.0000 mg | ORAL_TABLET | Freq: Two times a day (BID) | ORAL | Status: DC

## 2015-03-21 MED ORDER — INSULIN ASPART 100 UNIT/ML ~~LOC~~ SOLN: 0.0000 [IU] | Freq: Every day | SUBCUTANEOUS | Status: DC

## 2015-03-21 MED ORDER — PENTAFLUOROPROP-TETRAFLUOROETH EX AERO: 1.0000 "application " | INHALATION_SPRAY | CUTANEOUS | Status: DC | PRN

## 2015-03-21 MED ORDER — ALTEPLASE 2 MG IJ SOLR
2.0000 mg | Freq: Once | INTRAMUSCULAR | Status: DC | PRN
  Filled 2015-03-21: qty 2

## 2015-03-21 MED ORDER — CINACALCET HCL 30 MG PO TABS
30.0000 mg | ORAL_TABLET | Freq: Two times a day (BID) | ORAL | Status: DC
  Filled 2015-03-21 (×7): qty 1

## 2015-03-21 MED ORDER — LOSARTAN POTASSIUM 50 MG PO TABS
100.0000 mg | ORAL_TABLET | Freq: Every day | ORAL | Status: DC
  Administered 2015-03-21: 100 mg via ORAL
  Filled 2015-03-21: qty 2

## 2015-03-21 NOTE — ED Provider Notes (Addendum)
CSN: [id number]     Arrival date & time 03/21/15  0039 History   First MD Initiated Contact with Patient 03/21/15 0049     Chief Complaint  Patient presents with  . Chest Pain  . Shortness of Breath     (Consider location/radiation/quality/duration/timing/severity/associated sxs/prior Treatment) HPI  This is a 64 year old male with a history of hypertension, diabetes, end-stage renal disease disease on dialysis Monday, Wednesday, and Friday who presents with shortness of breath. Patient reports that he missed his dialysis session yesterday. He states that he had a flat tire. He reports that he does not usually misses dialysis. He states over the last several hours he has had increasing shortness of breath and tightness over his abdomen and lower chest. Denies any chest pain. Denies any fever. Does report occasional cough. Reports lower extremity swelling. Is unsure whether the shortness of breath is any worse when he lies flat. Denies any nausea, vomiting, or diarrhea.  Primary Nephrologist:  [facility]  Past Medical History  Diagnosis Date  . Hypertension   . Diabetes mellitus without complication ([place])   . Kidney infection   . MI (mitral incompetence)   . CAD (coronary artery disease)   . Seasonal allergies   . Depression   . History of kidney stones   . Arthritis    Past Surgical History  Procedure Laterality Date  . Back surgery    . Coronary angioplasty with stent placement  2008  . Spine surgery    . Eye surgery Right     lens implant  . Colonoscopy w/ polypectomy    . Insertion of dialysis catheter Right 01/30/2014    Procedure: INSERTION OF DIALYSIS CATHETER-RIGHT INTERNAL JUGULAR;  Surgeon: Elam Dutch, MD;  Location: [facility];  Service: Vascular;  Laterality: Right;  . Av fistula placement Left 01/30/2014    Procedure: INSERTION OF ARTERIOVENOUS (AV) GORE-TEX GRAFT LEFT UPPER ARM;  Surgeon: Elam Dutch, MD;  Location: [facility] OR;  Service: Vascular;   Laterality: Left;   Family History  Problem Relation Age of Onset  . Diabetes Other   . Diabetes Mother   . Hypertension Mother   . Diabetes Father   . Hypertension Father    Social History  Substance Use Topics  . Smoking status: Never Smoker   . Smokeless tobacco: Former Systems developer    Types: [place] date: 06/14/2010  . Alcohol Use: No    Review of Systems  Constitutional: Negative.  Negative for fever.  Respiratory: Positive for cough and shortness of breath. Negative for chest tightness.   Cardiovascular: Positive for leg swelling. Negative for chest pain.  Gastrointestinal: Negative.  Negative for nausea, vomiting and abdominal pain.  Genitourinary: Negative.  Negative for dysuria.  Neurological: Negative for headaches.  All other systems reviewed and are negative.     Allergies  Shellfish allergy  Home Medications   Prior to Admission medications   Medication Sig Start Date End Date Taking? Authorizing Provider  amLODipine (NORVASC) 10 MG tablet Take 10 mg by mouth daily.  01/13/14   Historical Provider, MD  calcium acetate (PHOSLO) 667 MG capsule Take 667 mg by mouth daily.    Historical Provider, MD  Cholecalciferol 1000 UNITS tablet Take 1,000 Units by mouth daily.    Historical Provider, MD  cloNIDine (CATAPRES) 0.1 MG tablet Take 0.1 mg by mouth 3 (three) times daily.  11/07/13   Historical Provider, MD  fenofibrate (TRICOR) 145 MG tablet Take  145 mg by mouth daily.    Historical Provider, MD  fluticasone (FLONASE) 50 MCG/ACT nasal spray Place 2 sprays into both nostrils daily as needed for rhinitis.     Historical Provider, MD  furosemide (LASIX) 20 MG tablet Take 60 mg by mouth daily.    Historical Provider, MD  insulin glargine (LANTUS) 100 UNIT/ML injection Inject 80 Units into the skin at bedtime.    Historical Provider, MD  metoprolol succinate (TOPROL-XL) 100 MG 24 hr tablet Take 1 tablet (100 mg total) by mouth 2 (two) times daily. 11/07/14   Milton Ferguson,  MD  Omega-3 Fatty Acids (FISH OIL) 1000 MG CAPS Take 2,000 mg by mouth daily.    Historical Provider, MD  traMADol (ULTRAM) 50 MG tablet Take 1 tablet (50 mg total) by mouth every 6 (six) hours as needed. 01/30/14   Elam Dutch, MD   BP 198/91 mmHg  Pulse 85  Temp(Src) 98.3 F (36.8 C) (Oral)  Resp 24  Ht 6\' 5"  (1.956 m)  Wt 214 lb (97.07 kg)  BMI 25.37 kg/m2  SpO2 93% Physical Exam  Constitutional: He is oriented to person, place, and time. No distress.  Elderly, no acute distress  HENT:  Head: Normocephalic and atraumatic.  Cardiovascular: Normal rate, regular rhythm and normal heart sounds.   No murmur heard. Pulmonary/Chest: Effort normal. No respiratory distress. He has wheezes.  Coarse crackles bilaterally, nasal cannula in place, no increased work of breathing AV fistula left upper extremity with positive thrill  Abdominal: Soft. Bowel sounds are normal. There is no tenderness. There is no rebound and no guarding.  Musculoskeletal: He exhibits edema.  Trace lower extremity edema  Neurological: He is alert and oriented to person, place, and time.  Skin: Skin is warm and dry.  Psychiatric: He has a normal mood and affect.  Nursing note and vitals reviewed.   ED Course  Procedures (including critical care time) Labs Review Labs Reviewed  CBC WITH DIFFERENTIAL/PLATELET - Abnormal; Notable for the following:    RBC 2.85 (*)    Hemoglobin 9.5 (*)    HCT 27.0 (*)    All other components within normal limits  BASIC METABOLIC PANEL - Abnormal; Notable for the following:    Sodium 134 (*)    Chloride 94 (*)    Glucose, Bld 211 (*)    BUN 99 (*)    Creatinine, Ser 16.26 (*)    Calcium 8.2 (*)    GFR calc non Af Amer 3 (*)    GFR calc Af Amer 3 (*)    Anion gap 16 (*)    All other components within normal limits  BRAIN NATRIURETIC PEPTIDE - Abnormal; Notable for the following:    B Natriuretic Peptide 569.0 (*)    All other components within normal limits   TROPONIN I    Imaging Review Dg Chest Portable 1 View  03/21/2015   CLINICAL DATA:  Acute onset of worsening shortness of breath and intermittent chest pain. Initial encounter.  EXAM: PORTABLE CHEST 1 VIEW  COMPARISON:  Chest radiograph performed 01/30/2014  FINDINGS: The lungs are well-aerated. Vascular congestion is noted, with bilateral central airspace opacities, concerning for pulmonary edema. There is no evidence of pleural effusion or pneumothorax.  The cardiomediastinal silhouette is borderline normal in size. No acute osseous abnormalities are seen.  IMPRESSION: Vascular congestion, with bilateral central airspace opacities, concerning for pulmonary edema.   Electronically Signed   By: Garald Balding M.D.   On: 03/21/2015  01:34   I have personally reviewed and evaluated these images and lab results as part of my medical decision-making.   EKG Interpretation   Date/Time:  Friday March 21 2015 00:51:22 EDT Ventricular Rate:  87 PR Interval:  221 QRS Duration: 172 QT Interval:  446 QTC Calculation: 537 R Axis:   102 Text Interpretation:  Sinus rhythm Prolonged PR interval Probable left  atrial enlargement RBBB and LPFB SImilar to prior Confirmed by Jameon Deller  MD,  [place] ([id number]) on 03/21/2015 12:54:15 AM      MDM   Final diagnoses:  Hypoxia  SOB (shortness of breath)     Patient presents for shortness of breath, abdominal and chest tightness. Nontoxic on exam. Initial vital signs notable for an oxygen saturation of 73%. Patient is not on home oxygen. He was placed on 3 L and sats are now 93%. He is in no acute distress otherwise and has no increased work of breathing. He does have crackles on exam and I suspect he is volume overloaded given that he missed dialysis yesterday.  EKG is unchanged from prior.  Basic labwork obtained. Potassium 3.9. Troponin negative. BNP elevated to 569. Chest x-ray shows vascular congestion with airspace opacities concerning for pulmonary edema.  Patient will need admission given his hypoxia for dialysis. Currently patient is stable on supplementary oxygen; however he may require BiPAP if his work of breathing changes.  Nephrology consulted. Awaiting callback. Discuss with Dr. and for, hospitalist. Will admit to MedSurg bed. Patient is stable at this time.   Merryl Hacker, MD 03/21/15 0247  4:16 AM Discussed with Dr. Lowanda Foster who was advised of patient's need for dialysis.  Merryl Hacker, MD 03/21/15 [phone]

## 2015-03-21 NOTE — ED Notes (Signed)
Pt states he started having chest pain and shortness of breath tonight.  Pt states he missed his dialysis yesterday (M/W/F)

## 2015-03-21 NOTE — H&P (Signed)
Admission History and Physical  [name]  [id number]  DOB: Feb 25, 1951  DOA: 03/21/2015  Referring physician: Thayer Jew, MD PCP: Geroge Baseman   Chief Complaint: Dyspnea  HPI: [name] is a 64 y.o. male with a past medical history significant for ESRD on HD, IDDM, HTN, CAD status post PCI to LAD, and a OCD who presents with dyspnea.  The patient normally undergoes dialysis MWF. This week he missed dialysis on Wednesday and last night after going to bed he began to feel tightness in his chest, fullness in his belly, and difficulty breathing.  This shortness of breath progressed and became more severe so he presented to the ER. He had no fever, chills, sputum, no chest pressure, palpitations, exertional symptoms.  In the ED, he was hypoxic to 73% on ambient air. Chest x-ray showed bilateral pulmonary edema and a BNP was elevated.   Review of Systems:  Patient seen 3:03 AM on 03/21/2015. Pt complains of shortness of breath, belly fullness, chest tightness, dry cough. All other systems were negative except as just noted or noted in the history of present illness.  Past Medical History: 1. IDDM 2. HTN 3. ASCVD, s/p STEMI and PCI to LAD in 2005 4. Anemia of renal disease 5. 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 Nephrolithiasis 7. Depression  Past Surgical History: 1. Back surgery 2. Right eye surgery 3. AV fistula placement   Social History: Patient lives with his wife. He does not smoke. No recent travel.  Allergies: NKDA    Family History:  Mother, diabetes, hypertension. Father diabetes, hypertension.    Home medications: 1. Ferric citrate to 10 3 times daily 2. Amlodipine 10 mg daily 3. Losartan 100 mg daily 4. Clonidine 0.1 mg 3 times a day 5. Carvedilol 25 mg twice daily 6. Sensipar     Physical Exam: BP 181/86 mmHg  Pulse 83  Temp(Src) 98.3 F (36.8 C) (Oral)  Resp 17  Ht 6\' 5"  (1.956 m)  Wt 97.07  kg (214 lb)  BMI 25.37 kg/m2  SpO2 96% General: Well-developed, adult male, alert and in no acute distress.  Responds appropriately to questions.   HEENT: Head normal.   Ears: Normal auditory acuity. Eyes: Sclerae normal without icterus, conjunctiva pink, lids and lashes normal.  PERRL and EOMI.   Nose: No deformity, discharge, or epistaxis.   Mouth: Edentulous.  OP moist without erythema, exudates, cobblestoning, or ulcers.  No airway deformities.   Skin: Warm and dry.  No jaundice.  No suspicious rashes or lesions. Cardiac: RRR, nl [id number], systolic murmur, 2/6 and S3.   JVP elevated.  Trace pretibial LE edema.  Radial pulses 2+ and symmetric. Respiratory: Normal respiratory rate and rhythm.  On nasal cannula.  No wheezes.  Rales at bilateral bases. Abdomen: BS not audible.  Abdomen without rigidity.  No TTP, but somewhat distended with fluid, no wave.   Neuro: Sensorium intact.   Speech is fluent.  Attention and concentration are normal.  Memory seems intact.  Moves all extremities equally and with normal coordination.    Psych: Appropriate affect.  Speech normal. Thought content/process linear/appropriate.  No evidence of aural or visual hallucinations or delusions.       Labs on Admission:  The metabolic panel is notable forsodium 134 mmol/L, creatinine 16 mg/dL, normal potassium and bicarbonate. The complete blood count is notableNORMOCYTIC ANEMIA STABLE, NO LEUKOCYTOSIS. BNP ELEVATED.  Radiological Exams on Admission: Personally reviewed: Dg Chest Portable 1 View 03/21/2015  Bilateral pulmonary edema.   EKG: Independently reviewed. Bifascicular block.  Sinus rhythm.    Assessment/Plan Principal Problem:   Hypoxia Active Problems:   Type 2 diabetes mellitus with diabetic nephropathy (HCC)   Essential hypertension   Coronary atherosclerosis   Anemia of chronic disease   Hyponatremia   End stage renal disease (HCC)   Fluid overload  1. Fluid overload causing hypoxia:    This is new. Related to missed dialysis. The patient makes a small amount of urine. The patient is stable at this time on supplemental oxygen. -Trial furosemide 120 mg once IV  -consult to nephrology for dialysis, appreciate cares  2. hypertension Not at goal. -Continue home amlodipine, losartan, clonidine, and carvedilol  3.Type 2 diabetes, insulin-dependent:  Continue home glargine 40 units nightly -Sliding scale corrections   4. ESRD on HD:  The patient will need to be dialyzed today.   - Continue home iron and Cinacalcet      DVT PPx: Low risk given outpatient status Diet: Renal Consultants: Nephrology Code Status: Full Family Communication: The patient's wife was present at bedside.  All questions were answered regarding patient's expected plan of care.   Disposition Plan:  At the point of initial evaluation, it is my clinical opinion that admission for OBSERVATION is reasonable and necessary at this time because the patient's presenting symptoms, physical exam findings, and initial radiographic and laboratory data in the context of chronic comorbidities is felt to place him/her at high risk for further clinical deterioration but it is anticipated that the patient may be medically stable for discharge from the hospital within 24 to 48 hours.     Edwin Dada [facility] Pager [phone]

## 2015-03-21 NOTE — Consult Note (Signed)
Reason for Consult: Difficulty in breathing and end-stage renal disease Referring Physician: [facility]  [name] is an 64 y.o. male.  HPI: He is a patient who has history of diabetes, hypertension, end-stage renal disease on maintenance hemodialysis Monday Wednesday Friday presently came with complains of difficulty breathing, orthopnea after missing his dialysis on Wednesday. When patient was evaluated in emergency room was found to have hypoxemia and CHF. He was put on another oxygen with some improvement. Patient denies any chest pain. He has some cough with some sputum production. He denies any nausea or vomiting.  Past Medical History  Diagnosis Date  . Hypertension   . Diabetes mellitus without complication ([place])   . Kidney infection   . MI (mitral incompetence)   . CAD (coronary artery disease)     STEMI with LAD stent by [facility] in 2005  . Seasonal allergies   . Depression   . History of kidney stones   . Arthritis   . Myocardial infarction [facility])     Past Surgical History  Procedure Laterality Date  . Back surgery    . Coronary angioplasty with stent placement  2008  . Spine surgery    . Eye surgery Right     lens implant  . Colonoscopy w/ polypectomy    . Insertion of dialysis catheter Right 01/30/2014    Procedure: INSERTION OF DIALYSIS CATHETER-RIGHT INTERNAL JUGULAR;  Surgeon: Elam Dutch, MD;  Location: [facility];  Service: Vascular;  Laterality: Right;  . Av fistula placement Left 01/30/2014    Procedure: INSERTION OF ARTERIOVENOUS (AV) GORE-TEX GRAFT LEFT UPPER ARM;  Surgeon: Elam Dutch, MD;  Location: [facility] OR;  Service: Vascular;  Laterality: Left;    Family History  Problem Relation Age of Onset  . Diabetes Other   . Diabetes Mother   . Hypertension Mother   . Diabetes Father   . Hypertension Father     Social History:  reports that he has never smoked. He quit smokeless tobacco use about 4 years ago. His smokeless tobacco use included  Chew. He reports that he does not drink alcohol or use illicit drugs.  Allergies:  Allergies  Allergen Reactions  . Shellfish Allergy Hives    Medications: I have reviewed the patient's current medications.  Results for orders placed or performed during the hospital encounter of 03/21/15 (from the past 48 hour(s))  CBC with Differential     Status: Abnormal   Collection Time: 03/21/15 12:56 AM  Result Value Ref Range   WBC 8.7 4.0 - 10.5 K/uL   RBC 2.85 (L) 4.22 - 5.81 MIL/uL   Hemoglobin 9.5 (L) 13.0 - 17.0 g/dL   HCT 27.0 (L) 39.0 - 52.0 %   MCV 94.7 78.0 - 100.0 fL   MCH 33.3 26.0 - 34.0 pg   MCHC 35.2 30.0 - 36.0 g/dL   RDW 13.6 11.5 - 15.5 %   Platelets 221 150 - 400 K/uL   Neutrophils Relative % 77 %   Neutro Abs 6.8 1.7 - 7.7 K/uL   Lymphocytes Relative 14 %   Lymphs Abs 1.2 0.7 - 4.0 K/uL   Monocytes Relative 5 %   Monocytes Absolute 0.4 0.1 - 1.0 K/uL   Eosinophils Relative 4 %   Eosinophils Absolute 0.3 0.0 - 0.7 K/uL   Basophils Relative 0 %   Basophils Absolute 0.0 0.0 - 0.1 K/uL  Basic metabolic panel     Status: Abnormal   Collection Time: 03/21/15 12:56 AM  Result Value Ref Range   Sodium 134 (L) 135 - 145 mmol/L   Potassium 3.9 3.5 - 5.1 mmol/L   Chloride 94 (L) 101 - 111 mmol/L   CO2 24 22 - 32 mmol/L   Glucose, Bld 211 (H) 65 - 99 mg/dL   BUN 99 (H) 6 - 20 mg/dL   Creatinine, Ser 16.26 (H) 0.61 - 1.24 mg/dL   Calcium 8.2 (L) 8.9 - 10.3 mg/dL   GFR calc non Af Amer 3 (L) >60 mL/min   GFR calc Af Amer 3 (L) >60 mL/min    Comment: (NOTE) The eGFR has been calculated using the CKD EPI equation. This calculation has not been validated in all clinical situations. eGFR's persistently <60 mL/min signify possible Chronic Kidney Disease.    Anion gap 16 (H) 5 - 15  Brain natriuretic peptide     Status: Abnormal   Collection Time: 03/21/15 12:56 AM  Result Value Ref Range   B Natriuretic Peptide 569.0 (H) 0.0 - 100.0 pg/mL  Troponin I     Status: None    Collection Time: 03/21/15 12:56 AM  Result Value Ref Range   Troponin I <0.03 <0.031 ng/mL    Comment:        NO INDICATION OF MYOCARDIAL INJURY.     Dg Chest Portable 1 View  03/21/2015   CLINICAL DATA:  Acute onset of worsening shortness of breath and intermittent chest pain. Initial encounter.  EXAM: PORTABLE CHEST 1 VIEW  COMPARISON:  Chest radiograph performed 01/30/2014  FINDINGS: The lungs are well-aerated. Vascular congestion is noted, with bilateral central airspace opacities, concerning for pulmonary edema. There is no evidence of pleural effusion or pneumothorax.  The cardiomediastinal silhouette is borderline normal in size. No acute osseous abnormalities are seen.  IMPRESSION: Vascular congestion, with bilateral central airspace opacities, concerning for pulmonary edema.   Electronically Signed   By: Garald Balding M.D.   On: 03/21/2015 01:34    Review of Systems  Constitutional: Negative for fever and chills.  Respiratory: Positive for cough, sputum production and shortness of breath.   Cardiovascular: Positive for orthopnea. Negative for chest pain and claudication.  Gastrointestinal: Negative for nausea and vomiting.   Blood pressure 202/89, pulse 85, temperature 98.3 F (36.8 C), temperature source Oral, resp. rate 19, height '6\' 5"'  (1.956 m), weight 214 lb (97.07 kg), SpO2 97 %. Physical Exam  Constitutional: He is oriented to person, place, and time. No distress.  Eyes: No scleral icterus.  Neck: No JVD present.  Cardiovascular: Normal rate and regular rhythm.   Respiratory: No respiratory distress. He has rales.  GI: He exhibits no distension. There is no tenderness.  Musculoskeletal: He exhibits no edema.  Neurological: He is alert and oriented to person, place, and time.    Assessment/Plan: Problem #1 fluid overload: This is secondary to non-compliance with his dialysis. His last dialysis was on Monday and patient with his dialysis on Wednesday. Presently on  oxygen and seems to be feeling better. Problem #2 end-stage renal disease: Patient denies any nausea or vomiting. His potassium is normal Problem #3 hypertension: His blood pressure is high Problem #4 diabetes Problem #5 anemia: His hemoglobin is below target goal. Problem #6 metabolic bone disease: His calcium is range but phosphorus is not available. Problem #7 coronary artery disease: Patient presently does not have any chest pain. Plan: We'll make arrangements for patient to get dialysis today for 4 hours 2] we'll remove about 3 L 3] patient advised not to  miss dialysis and to decrease his salt and fluid intake. 4] patient will be going back to his dialysis unit once he is stable.  Tracee Mccreery S 03/21/2015, 5:44 AM

## 2015-03-21 NOTE — Care Management Note (Signed)
Case Management Note  Patient Details  Name: [name] MRN: [id number] Date of Birth: Oct 29, 1950  Expected Discharge Date:    03/21/2015              Expected Discharge Plan:  Home/Self Care  In-House Referral:  NA  Discharge planning Services  CM Consult  Post Acute Care Choice:  NA Choice offered to:  NA  DME Arranged:    DME Agency:     HH Arranged:    [place] Agency:     Status of Service:  Completed, signed off  Medicare Important Message Given:    Date Medicare IM Given:    Medicare IM give by:    Date Additional Medicare IM Given:    Additional Medicare Important Message give by:     If discussed at [place] of Stay Meetings, dates discussed:    Additional Comments: Pt is from home, lives with wife and ind at baseline. Pt is on HD at [facility] in [place]. Pt anticipates DC home today with self care. No CM needs anticipated.  Sherald Barge, RN 03/21/2015, 2:52 PM

## 2015-03-21 NOTE — ED Notes (Signed)
Report given to [employer] ,

## 2015-03-21 NOTE — ED Notes (Signed)
Pt updated on plan of care,  

## 2015-03-21 NOTE — ED Notes (Signed)
Pt reports that he is due to go to dialysis this am, is Monday, Wednesday, Friday,

## 2015-03-21 NOTE — ED Notes (Signed)
Pt c/o sob that increased today, chest pain that was earlier but has resolved now, pt states "I feel like I have extra fluid in my abd and lungs", pulse ox upper 70"s on RA, pt placed on oxygen with improved to 92-93%, reports that he is breathing better,

## 2015-03-21 NOTE — Procedures (Signed)
   HEMODIALYSIS NURSING NOTE:  4 hour heparin-free dialysis completed via left upper arm AVG (15g ante/retrograde). Difficult cannulation of venous end of AVG with multiple clot aspirations.  Unable to achieve prescribed Qb of 400cc/min. Max Qb 300 due to venous pressures 280-300 mmHg. BVP 70.1 L. Goal met: 3.4 liters removed. spO2 97% on room air post HD. Hypertensive throughout session; post-HD BP 197/95. Cozaar dose due. All blood was reinfused. Hemostasis was achieved in 20 minutes. Report given to [place], Therapist, sports.  Rockwell [place], RN, CDN

## 2015-03-21 NOTE — Discharge Summary (Signed)
Physician Discharge Summary  Parmvir Ure [id number] DOB: [birth year] DOA: 03/21/2015  PCP: Geroge Baseman  Admit date: 03/21/2015 Discharge date: 03/21/2015  Time spent: 35 minutes  Recommendations for Outpatient Follow-up:  1. Follow-up at regular dialysis center on 03/24/15 for maintenance dialysis  Discharge Diagnoses:  Principal Problem:   Acute respiratory failure with Hypoxia Active Problems:   Type 2 diabetes mellitus with diabetic nephropathy (HCC)   Essential hypertension   Coronary atherosclerosis   Anemia of chronic disease   Hyponatremia   End stage renal disease (HCC)   Fluid overload   Acute pulmonary edema   Discharge Condition: improved  Diet recommendation: low salt, low carb  Filed Weights   03/21/15 0330 03/21/15 0900 03/21/15 1330  Weight: 97.433 kg (214 lb 12.8 oz) 97.7 kg (215 lb 6.2 oz) 94.1 kg (207 lb 7.3 oz)    History of present illness:  This patient presents to the hospital with complaints of shortness of breath. He missed his regularly scheduled dialysis on 03/19/15. He began having chest tightness, difficulty breathing causing him to come to the emergency room for evaluation. In the ED was noted to be hypoxic on room air. Chest x-ray showed pulmonary edema. He was admitted for further treatments  Hospital Course:  Patient was admitted to the hospital and evaluated by nephrology. He underwent dialysis with removal of 3.4 L of fluid. The patient also significantly improved. His shortness of breath resolved as did his hypoxia. He emulated on room air and maintained saturations at 100%. He did not have any further shortness of breath, no chest discomfort. He was advised to stay on his regular dialysis schedule. Blood pressure was noted to be elevated, but suspect this is a chronic finding. He is continued on his regular antihypertensives. He is otherwise asymptomatic. Patient is otherwise stable for discharge  home.  Procedures:    Consultations:  Nephrology  Discharge Exam: Filed Vitals:   03/21/15 1330  BP: 197/95  Pulse: 73  Temp: 98.4 F (36.9 C)  Resp: 16    General: NAD Cardiovascular: S1, S2 RRR Respiratory: CTA B  Discharge Instructions   Discharge Instructions    Diet - low sodium heart healthy    Complete by:  As directed      Increase activity slowly    Complete by:  As directed           Current Discharge Medication List    START taking these medications   Details  carvedilol (COREG) 25 MG tablet Take 1 tablet (25 mg total) by mouth 2 (two) times daily with a meal.    losartan (COZAAR) 100 MG tablet Take 1 tablet (100 mg total) by mouth daily.      CONTINUE these medications which have NOT CHANGED   Details  amLODipine (NORVASC) 10 MG tablet Take 10 mg by mouth daily.     calcium acetate (PHOSLO) 667 MG capsule Take 667 mg by mouth daily.    Cholecalciferol 1000 UNITS tablet Take 1,000 Units by mouth daily.    cloNIDine (CATAPRES) 0.1 MG tablet Take 0.1 mg by mouth 3 (three) times daily.     insulin glargine (LANTUS) 100 UNIT/ML injection Inject 80 Units into the skin at bedtime.       Allergies  Allergen Reactions  . Shellfish Allergy Hives      The results of significant diagnostics from this hospitalization (including imaging, microbiology, ancillary and laboratory) are listed below for reference.    Significant Diagnostic Studies: Dg Chest Portable  1 View  03/21/2015   CLINICAL DATA:  Acute onset of worsening shortness of breath and intermittent chest pain. Initial encounter.  EXAM: PORTABLE CHEST 1 VIEW  COMPARISON:  Chest radiograph performed 01/30/2014  FINDINGS: The lungs are well-aerated. Vascular congestion is noted, with bilateral central airspace opacities, concerning for pulmonary edema. There is no evidence of pleural effusion or pneumothorax.  The cardiomediastinal silhouette is borderline normal in size. No acute osseous  abnormalities are seen.  IMPRESSION: Vascular congestion, with bilateral central airspace opacities, concerning for pulmonary edema.   Electronically Signed   By: Garald Balding M.D.   On: 03/21/2015 01:34    Microbiology: No results found for this or any previous visit (from the past 240 hour(s)).   Labs: Basic Metabolic Panel:  Recent Labs Lab 03/21/15 0056  NA 134*  K 3.9  CL 94*  CO2 24  GLUCOSE 211*  BUN 99*  CREATININE 16.26*  CALCIUM 8.2*   Liver Function Tests: No results for input(s): AST, ALT, ALKPHOS, BILITOT, PROT, ALBUMIN in the last 168 hours. No results for input(s): LIPASE, AMYLASE in the last 168 hours. No results for input(s): AMMONIA in the last 168 hours. CBC:  Recent Labs Lab 03/21/15 0056  WBC 8.7  NEUTROABS 6.8  HGB 9.5*  HCT 27.0*  MCV 94.7  PLT 221   Cardiac Enzymes:  Recent Labs Lab 03/21/15 0056  TROPONINI <0.03   BNP: BNP (last 3 results)  Recent Labs  03/21/15 0056  BNP 569.0*    ProBNP (last 3 results) No results for input(s): PROBNP in the last 8760 hours.  CBG:  Recent Labs Lab 03/21/15 0837 03/21/15 1420  GLUCAP 249* 100*       Signed:  Jamie Hafford  [facility] 03/21/2015, 3:54 PM

## 2015-03-30 ENCOUNTER — Emergency Department (HOSPITAL_COMMUNITY): Payer: Medicare Other

## 2015-03-30 ENCOUNTER — Inpatient Hospital Stay (HOSPITAL_COMMUNITY)
Admission: EM | Admit: 2015-03-30 | Discharge: 2015-04-10 | DRG: 233 | Disposition: A | Discharge status: Home Health | Payer: Medicare Other | Attending: Cardiothoracic Surgery | Admitting: Cardiothoracic Surgery

## 2015-03-30 ENCOUNTER — Encounter (HOSPITAL_COMMUNITY): Payer: Self-pay | Admitting: Emergency Medicine

## 2015-03-30 DIAGNOSIS — Z79899 Other long term (current) drug therapy: Secondary | ICD-10-CM

## 2015-03-30 DIAGNOSIS — Z833 Family history of diabetes mellitus: Secondary | ICD-10-CM

## 2015-03-30 DIAGNOSIS — I237 Postinfarction angina: Secondary | ICD-10-CM | POA: Diagnosis not present

## 2015-03-30 DIAGNOSIS — J9811 Atelectasis: Secondary | ICD-10-CM | POA: Diagnosis not present

## 2015-03-30 DIAGNOSIS — N179 Acute kidney failure, unspecified: Secondary | ICD-10-CM | POA: Diagnosis present

## 2015-03-30 DIAGNOSIS — Z992 Dependence on renal dialysis: Secondary | ICD-10-CM

## 2015-03-30 DIAGNOSIS — Z794 Long term (current) use of insulin: Secondary | ICD-10-CM

## 2015-03-30 DIAGNOSIS — E8779 Other fluid overload: Secondary | ICD-10-CM

## 2015-03-30 DIAGNOSIS — R0602 Shortness of breath: Secondary | ICD-10-CM | POA: Diagnosis not present

## 2015-03-30 DIAGNOSIS — E1122 Type 2 diabetes mellitus with diabetic chronic kidney disease: Secondary | ICD-10-CM | POA: Diagnosis present

## 2015-03-30 DIAGNOSIS — I10 Essential (primary) hypertension: Secondary | ICD-10-CM | POA: Diagnosis present

## 2015-03-30 DIAGNOSIS — I5031 Acute diastolic (congestive) heart failure: Secondary | ICD-10-CM | POA: Diagnosis present

## 2015-03-30 DIAGNOSIS — J9601 Acute respiratory failure with hypoxia: Secondary | ICD-10-CM | POA: Diagnosis present

## 2015-03-30 DIAGNOSIS — D631 Anemia in chronic kidney disease: Secondary | ICD-10-CM | POA: Diagnosis present

## 2015-03-30 DIAGNOSIS — Z87891 Personal history of nicotine dependence: Secondary | ICD-10-CM

## 2015-03-30 DIAGNOSIS — N2581 Secondary hyperparathyroidism of renal origin: Secondary | ICD-10-CM | POA: Diagnosis present

## 2015-03-30 DIAGNOSIS — I214 Non-ST elevation (NSTEMI) myocardial infarction: Principal | ICD-10-CM | POA: Diagnosis present

## 2015-03-30 DIAGNOSIS — N186 End stage renal disease: Secondary | ICD-10-CM | POA: Diagnosis present

## 2015-03-30 DIAGNOSIS — Z91013 Allergy to seafood: Secondary | ICD-10-CM

## 2015-03-30 DIAGNOSIS — I159 Secondary hypertension, unspecified: Secondary | ICD-10-CM | POA: Insufficient documentation

## 2015-03-30 DIAGNOSIS — Y831 Surgical operation with implant of artificial internal device as the cause of abnormal reaction of the patient, or of later complication, without mention of misadventure at the time of the procedure: Secondary | ICD-10-CM | POA: Diagnosis present

## 2015-03-30 DIAGNOSIS — E8770 Fluid overload, unspecified: Secondary | ICD-10-CM | POA: Diagnosis present

## 2015-03-30 DIAGNOSIS — T82855A Stenosis of coronary artery stent, initial encounter: Secondary | ICD-10-CM | POA: Diagnosis present

## 2015-03-30 DIAGNOSIS — Z8249 Family history of ischemic heart disease and other diseases of the circulatory system: Secondary | ICD-10-CM

## 2015-03-30 DIAGNOSIS — J939 Pneumothorax, unspecified: Secondary | ICD-10-CM

## 2015-03-30 DIAGNOSIS — Z9689 Presence of other specified functional implants: Secondary | ICD-10-CM

## 2015-03-30 DIAGNOSIS — Z951 Presence of aortocoronary bypass graft: Secondary | ICD-10-CM

## 2015-03-30 DIAGNOSIS — E1121 Type 2 diabetes mellitus with diabetic nephropathy: Secondary | ICD-10-CM | POA: Diagnosis present

## 2015-03-30 DIAGNOSIS — I252 Old myocardial infarction: Secondary | ICD-10-CM

## 2015-03-30 DIAGNOSIS — D62 Acute posthemorrhagic anemia: Secondary | ICD-10-CM | POA: Diagnosis not present

## 2015-03-30 DIAGNOSIS — Z955 Presence of coronary angioplasty implant and graft: Secondary | ICD-10-CM

## 2015-03-30 DIAGNOSIS — N189 Chronic kidney disease, unspecified: Secondary | ICD-10-CM | POA: Diagnosis present

## 2015-03-30 DIAGNOSIS — I2510 Atherosclerotic heart disease of native coronary artery without angina pectoris: Secondary | ICD-10-CM | POA: Diagnosis present

## 2015-03-30 DIAGNOSIS — I132 Hypertensive heart and chronic kidney disease with heart failure and with stage 5 chronic kidney disease, or end stage renal disease: Secondary | ICD-10-CM | POA: Diagnosis present

## 2015-03-30 DIAGNOSIS — E785 Hyperlipidemia, unspecified: Secondary | ICD-10-CM | POA: Diagnosis present

## 2015-03-30 DIAGNOSIS — E119 Type 2 diabetes mellitus without complications: Secondary | ICD-10-CM | POA: Diagnosis present

## 2015-03-30 DIAGNOSIS — R0902 Hypoxemia: Secondary | ICD-10-CM

## 2015-03-30 LAB — CBC WITH DIFFERENTIAL/PLATELET
Basophils Absolute: 0 10*3/uL (ref 0.0–0.1)
Basophils Relative: 0 %
Eosinophils Absolute: 0.4 10*3/uL (ref 0.0–0.7)
Eosinophils Relative: 3 %
HCT: 30 % — ABNORMAL LOW (ref 39.0–52.0)
Hemoglobin: 10.2 g/dL — ABNORMAL LOW (ref 13.0–17.0)
Lymphocytes Relative: 11 %
Lymphs Abs: 1.4 10*3/uL (ref 0.7–4.0)
MCH: 32.3 pg (ref 26.0–34.0)
MCHC: 34 g/dL (ref 30.0–36.0)
MCV: 94.9 fL (ref 78.0–100.0)
Monocytes Absolute: 0.4 10*3/uL (ref 0.1–1.0)
Monocytes Relative: 3 %
Neutro Abs: 10.6 10*3/uL — ABNORMAL HIGH (ref 1.7–7.7)
Neutrophils Relative %: 83 %
Platelets: 233 10*3/uL (ref 150–400)
RBC: 3.16 MIL/uL — ABNORMAL LOW (ref 4.22–5.81)
RDW: 13.7 % (ref 11.5–15.5)
WBC: 12.8 10*3/uL — ABNORMAL HIGH (ref 4.0–10.5)

## 2015-03-30 MED ORDER — IPRATROPIUM BROMIDE 0.02 % IN SOLN: 0.5000 mg | Freq: Once | RESPIRATORY_TRACT | Status: DC

## 2015-03-30 MED ORDER — ALBUTEROL SULFATE (2.5 MG/3ML) 0.083% IN NEBU: 5.0000 mg | INHALATION_SOLUTION | Freq: Once | RESPIRATORY_TRACT | Status: DC

## 2015-03-30 MED ORDER — NITROGLYCERIN 2 % TD OINT
1.0000 [in_us] | TOPICAL_OINTMENT | Freq: Once | TRANSDERMAL | Status: AC
  Administered 2015-03-30: 1 [in_us] via TOPICAL
  Filled 2015-03-30: qty 1

## 2015-03-30 MED ORDER — IPRATROPIUM-ALBUTEROL 0.5-2.5 (3) MG/3ML IN SOLN
3.0000 mL | Freq: Once | RESPIRATORY_TRACT | Status: AC
  Administered 2015-03-30: 3 mL via RESPIRATORY_TRACT
  Filled 2015-03-30: qty 3

## 2015-03-30 MED ORDER — ALBUTEROL SULFATE (2.5 MG/3ML) 0.083% IN NEBU
2.5000 mg | INHALATION_SOLUTION | Freq: Once | RESPIRATORY_TRACT | Status: AC
  Administered 2015-03-30: 2.5 mg via RESPIRATORY_TRACT
  Filled 2015-03-30: qty 3

## 2015-03-30 NOTE — ED Notes (Signed)
Pt has been having increased SOB for last two hrs. Pt is on dialysis and last HD was on Friday. Pt states he thinks his "fluid is building up".

## 2015-03-31 ENCOUNTER — Encounter (HOSPITAL_COMMUNITY): Payer: Self-pay | Admitting: Internal Medicine

## 2015-03-31 DIAGNOSIS — N2581 Secondary hyperparathyroidism of renal origin: Secondary | ICD-10-CM | POA: Diagnosis present

## 2015-03-31 DIAGNOSIS — I214 Non-ST elevation (NSTEMI) myocardial infarction: Secondary | ICD-10-CM | POA: Diagnosis present

## 2015-03-31 DIAGNOSIS — E1121 Type 2 diabetes mellitus with diabetic nephropathy: Secondary | ICD-10-CM

## 2015-03-31 DIAGNOSIS — N179 Acute kidney failure, unspecified: Secondary | ICD-10-CM | POA: Diagnosis present

## 2015-03-31 DIAGNOSIS — I132 Hypertensive heart and chronic kidney disease with heart failure and with stage 5 chronic kidney disease, or end stage renal disease: Secondary | ICD-10-CM | POA: Diagnosis present

## 2015-03-31 DIAGNOSIS — D62 Acute posthemorrhagic anemia: Secondary | ICD-10-CM | POA: Diagnosis not present

## 2015-03-31 DIAGNOSIS — Z87891 Personal history of nicotine dependence: Secondary | ICD-10-CM | POA: Diagnosis not present

## 2015-03-31 DIAGNOSIS — R0602 Shortness of breath: Secondary | ICD-10-CM | POA: Diagnosis present

## 2015-03-31 DIAGNOSIS — E8779 Other fluid overload: Secondary | ICD-10-CM | POA: Diagnosis not present

## 2015-03-31 DIAGNOSIS — N186 End stage renal disease: Secondary | ICD-10-CM | POA: Diagnosis not present

## 2015-03-31 DIAGNOSIS — Z992 Dependence on renal dialysis: Secondary | ICD-10-CM | POA: Diagnosis not present

## 2015-03-31 DIAGNOSIS — E785 Hyperlipidemia, unspecified: Secondary | ICD-10-CM | POA: Diagnosis present

## 2015-03-31 DIAGNOSIS — J9811 Atelectasis: Secondary | ICD-10-CM | POA: Diagnosis not present

## 2015-03-31 DIAGNOSIS — Z8249 Family history of ischemic heart disease and other diseases of the circulatory system: Secondary | ICD-10-CM | POA: Diagnosis not present

## 2015-03-31 DIAGNOSIS — I237 Postinfarction angina: Secondary | ICD-10-CM | POA: Diagnosis not present

## 2015-03-31 DIAGNOSIS — Z91013 Allergy to seafood: Secondary | ICD-10-CM | POA: Diagnosis not present

## 2015-03-31 DIAGNOSIS — E8770 Fluid overload, unspecified: Secondary | ICD-10-CM

## 2015-03-31 DIAGNOSIS — I5031 Acute diastolic (congestive) heart failure: Secondary | ICD-10-CM | POA: Diagnosis not present

## 2015-03-31 DIAGNOSIS — I10 Essential (primary) hypertension: Secondary | ICD-10-CM | POA: Diagnosis not present

## 2015-03-31 DIAGNOSIS — Z79899 Other long term (current) drug therapy: Secondary | ICD-10-CM | POA: Diagnosis not present

## 2015-03-31 DIAGNOSIS — D631 Anemia in chronic kidney disease: Secondary | ICD-10-CM | POA: Diagnosis present

## 2015-03-31 DIAGNOSIS — I25110 Atherosclerotic heart disease of native coronary artery with unstable angina pectoris: Secondary | ICD-10-CM | POA: Diagnosis not present

## 2015-03-31 DIAGNOSIS — Z833 Family history of diabetes mellitus: Secondary | ICD-10-CM | POA: Diagnosis not present

## 2015-03-31 DIAGNOSIS — J9601 Acute respiratory failure with hypoxia: Secondary | ICD-10-CM | POA: Diagnosis not present

## 2015-03-31 DIAGNOSIS — I2510 Atherosclerotic heart disease of native coronary artery without angina pectoris: Secondary | ICD-10-CM | POA: Diagnosis not present

## 2015-03-31 DIAGNOSIS — Z01810 Encounter for preprocedural cardiovascular examination: Secondary | ICD-10-CM | POA: Diagnosis not present

## 2015-03-31 DIAGNOSIS — Z955 Presence of coronary angioplasty implant and graft: Secondary | ICD-10-CM | POA: Diagnosis not present

## 2015-03-31 DIAGNOSIS — E1122 Type 2 diabetes mellitus with diabetic chronic kidney disease: Secondary | ICD-10-CM | POA: Diagnosis present

## 2015-03-31 DIAGNOSIS — Y831 Surgical operation with implant of artificial internal device as the cause of abnormal reaction of the patient, or of later complication, without mention of misadventure at the time of the procedure: Secondary | ICD-10-CM | POA: Diagnosis present

## 2015-03-31 DIAGNOSIS — Z794 Long term (current) use of insulin: Secondary | ICD-10-CM | POA: Diagnosis not present

## 2015-03-31 DIAGNOSIS — I509 Heart failure, unspecified: Secondary | ICD-10-CM | POA: Diagnosis not present

## 2015-03-31 DIAGNOSIS — I252 Old myocardial infarction: Secondary | ICD-10-CM | POA: Diagnosis not present

## 2015-03-31 DIAGNOSIS — T82855A Stenosis of coronary artery stent, initial encounter: Secondary | ICD-10-CM | POA: Diagnosis present

## 2015-03-31 LAB — CBC
HCT: 25.5 % — ABNORMAL LOW (ref 39.0–52.0)
Hemoglobin: 8.9 g/dL — ABNORMAL LOW (ref 13.0–17.0)
MCH: 32.8 pg (ref 26.0–34.0)
MCHC: 34.9 g/dL (ref 30.0–36.0)
MCV: 94.1 fL (ref 78.0–100.0)
Platelets: 199 10*3/uL (ref 150–400)
RBC: 2.71 MIL/uL — ABNORMAL LOW (ref 4.22–5.81)
RDW: 13.8 % (ref 11.5–15.5)
WBC: 11.3 10*3/uL — ABNORMAL HIGH (ref 4.0–10.5)

## 2015-03-31 LAB — BASIC METABOLIC PANEL
Anion gap: 16 — ABNORMAL HIGH (ref 5–15)
BUN: 81 mg/dL — ABNORMAL HIGH (ref 6–20)
CO2: 26 mmol/L (ref 22–32)
Calcium: 9.2 mg/dL (ref 8.9–10.3)
Chloride: 97 mmol/L — ABNORMAL LOW (ref 101–111)
Creatinine, Ser: 12.72 mg/dL — ABNORMAL HIGH (ref 0.61–1.24)
GFR calc Af Amer: 4 mL/min — ABNORMAL LOW (ref >60)
GFR calc non Af Amer: 4 mL/min — ABNORMAL LOW (ref >60)
Glucose, Bld: 166 mg/dL — ABNORMAL HIGH (ref 65–99)
Potassium: 4 mmol/L (ref 3.5–5.1)
Sodium: 139 mmol/L (ref 135–145)

## 2015-03-31 LAB — GLUCOSE, CAPILLARY: Glucose-Capillary: 262 mg/dL — ABNORMAL HIGH (ref 65–99)

## 2015-03-31 LAB — PROTIME-INR
INR: 1.11 (ref 0.00–1.49)
Prothrombin Time: 14.5 seconds (ref 11.6–15.2)

## 2015-03-31 LAB — RENAL FUNCTION PANEL
Albumin: 3.5 g/dL (ref 3.5–5.0)
Anion gap: 14 (ref 5–15)
BUN: 87 mg/dL — ABNORMAL HIGH (ref 6–20)
CO2: 24 mmol/L (ref 22–32)
Calcium: 8.9 mg/dL (ref 8.9–10.3)
Chloride: 97 mmol/L — ABNORMAL LOW (ref 101–111)
Creatinine, Ser: 13.68 mg/dL — ABNORMAL HIGH (ref 0.61–1.24)
GFR calc Af Amer: 4 mL/min — ABNORMAL LOW (ref >60)
GFR calc non Af Amer: 3 mL/min — ABNORMAL LOW (ref >60)
Glucose, Bld: 189 mg/dL — ABNORMAL HIGH (ref 65–99)
Phosphorus: 6.3 mg/dL — ABNORMAL HIGH (ref 2.5–4.6)
Potassium: 4.2 mmol/L (ref 3.5–5.1)
Sodium: 135 mmol/L (ref 135–145)

## 2015-03-31 LAB — TROPONIN I
Troponin I: 13.87 ng/mL (ref <0.031)
Troponin I: 14.46 ng/mL (ref <0.031)

## 2015-03-31 MED ORDER — HEPARIN BOLUS VIA INFUSION
4000.0000 [IU] | Freq: Once | INTRAVENOUS | Status: AC
  Administered 2015-03-31: 4000 [IU] via INTRAVENOUS
  Filled 2015-03-31: qty 4000

## 2015-03-31 MED ORDER — SODIUM CHLORIDE 0.9 % IV SOLN: 100.0000 mL | INTRAVENOUS | Status: DC | PRN

## 2015-03-31 MED ORDER — PENTAFLUOROPROP-TETRAFLUOROETH EX AERO: 1.0000 "application " | INHALATION_SPRAY | CUTANEOUS | Status: DC | PRN

## 2015-03-31 MED ORDER — ADULT MULTIVITAMIN W/MINERALS CH
ORAL_TABLET | Freq: Every day | ORAL | Status: DC
  Administered 2015-03-31 – 2015-04-05 (×4): 1 via ORAL
  Filled 2015-03-31 (×5): qty 1

## 2015-03-31 MED ORDER — HEPARIN SODIUM (PORCINE) 1000 UNIT/ML DIALYSIS
20.0000 [IU]/kg | INTRAMUSCULAR | Status: DC | PRN
  Filled 2015-03-31: qty 2

## 2015-03-31 MED ORDER — SODIUM CHLORIDE 0.9 % IJ SOLN: 3.0000 mL | INTRAMUSCULAR | Status: DC | PRN

## 2015-03-31 MED ORDER — CALCIUM ACETATE (PHOS BINDER) 667 MG PO CAPS
667.0000 mg | ORAL_CAPSULE | Freq: Every day | ORAL | Status: DC
Start: 2015-03-31 — End: 2015-04-10
  Administered 2015-03-31 – 2015-04-10 (×7): 667 mg via ORAL
  Filled 2015-03-31 (×10): qty 1

## 2015-03-31 MED ORDER — SODIUM CHLORIDE 0.9 % IJ SOLN
3.0000 mL | Freq: Two times a day (BID) | INTRAMUSCULAR | Status: DC
  Administered 2015-04-01: 3 mL via INTRAVENOUS

## 2015-03-31 MED ORDER — SODIUM CHLORIDE 0.9 % IV SOLN: 250.0000 mL | INTRAVENOUS | Status: DC | PRN

## 2015-03-31 MED ORDER — PREDNISONE 10 MG PO TABS
50.0000 mg | ORAL_TABLET | Freq: Four times a day (QID) | ORAL | Status: AC
  Administered 2015-03-31 – 2015-04-01 (×3): 50 mg via ORAL
  Filled 2015-03-31: qty 2
  Filled 2015-03-31: qty 1
  Filled 2015-03-31: qty 2
  Filled 2015-03-31 (×2): qty 1
  Filled 2015-03-31 (×2): qty 2

## 2015-03-31 MED ORDER — LABETALOL HCL 5 MG/ML IV SOLN
20.0000 mg | Freq: Once | INTRAVENOUS | Status: DC
  Filled 2015-03-31: qty 4

## 2015-03-31 MED ORDER — DIPHENHYDRAMINE HCL 25 MG PO CAPS
50.0000 mg | ORAL_CAPSULE | Freq: Once | ORAL | Status: AC
  Administered 2015-03-31: 50 mg via ORAL
  Filled 2015-03-31: qty 2

## 2015-03-31 MED ORDER — FERRIC CITRATE 1 GM 210 MG(FE) PO TABS
1.0000 | ORAL_TABLET | Freq: Three times a day (TID) | ORAL | Status: DC
Start: 2015-03-31 — End: 2015-04-03

## 2015-03-31 MED ORDER — CINACALCET HCL 30 MG PO TABS
30.0000 mg | ORAL_TABLET | Freq: Two times a day (BID) | ORAL | Status: DC
  Administered 2015-03-31 – 2015-04-02 (×3): 30 mg via ORAL
  Filled 2015-03-31 (×10): qty 1

## 2015-03-31 MED ORDER — CARVEDILOL 25 MG PO TABS
25.0000 mg | ORAL_TABLET | Freq: Two times a day (BID) | ORAL | Status: DC
Start: 2015-03-31 — End: 2015-04-03
  Administered 2015-03-31 – 2015-04-02 (×6): 25 mg via ORAL
  Filled 2015-03-31 (×2): qty 1
  Filled 2015-03-31 (×2): qty 2
  Filled 2015-03-31 (×4): qty 1

## 2015-03-31 MED ORDER — LIDOCAINE-PRILOCAINE 2.5-2.5 % EX CREA: 1.0000 "application " | TOPICAL_CREAM | CUTANEOUS | Status: DC | PRN

## 2015-03-31 MED ORDER — SODIUM CHLORIDE 0.9 % IV SOLN
INTRAVENOUS | Status: DC
  Administered 2015-04-01: 06:00:00 via INTRAVENOUS

## 2015-03-31 MED ORDER — ASPIRIN 325 MG PO TABS
325.0000 mg | ORAL_TABLET | Freq: Every day | ORAL | Status: DC
  Administered 2015-03-31 – 2015-04-02 (×2): 325 mg via ORAL
  Filled 2015-03-31: qty 1

## 2015-03-31 MED ORDER — HEPARIN (PORCINE) IN NACL 100-0.45 UNIT/ML-% IJ SOLN
1200.0000 [IU]/h | INTRAMUSCULAR | Status: DC
  Administered 2015-03-31: 1200 [IU]/h via INTRAVENOUS
  Filled 2015-03-31 (×2): qty 250

## 2015-03-31 MED ORDER — ATORVASTATIN CALCIUM 80 MG PO TABS
80.0000 mg | ORAL_TABLET | Freq: Every day | ORAL | Status: DC
  Administered 2015-03-31 – 2015-04-09 (×9): 80 mg via ORAL
  Filled 2015-03-31: qty 1
  Filled 2015-03-31: qty 2
  Filled 2015-03-31 (×9): qty 1

## 2015-03-31 MED ORDER — LIDOCAINE HCL (PF) 1 % IJ SOLN: 5.0000 mL | INTRAMUSCULAR | Status: DC | PRN

## 2015-03-31 MED ORDER — ALTEPLASE 2 MG IJ SOLR
2.0000 mg | Freq: Once | INTRAMUSCULAR | Status: DC | PRN
  Filled 2015-03-31: qty 2

## 2015-03-31 MED ORDER — AMLODIPINE BESYLATE 10 MG PO TABS
10.0000 mg | ORAL_TABLET | Freq: Every day | ORAL | Status: DC
  Administered 2015-03-31 – 2015-04-02 (×3): 10 mg via ORAL
  Filled 2015-03-31: qty 1
  Filled 2015-03-31: qty 2
  Filled 2015-03-31: qty 1

## 2015-03-31 MED ORDER — INSULIN GLARGINE 100 UNIT/ML ~~LOC~~ SOLN
40.0000 [IU] | Freq: Every day | SUBCUTANEOUS | Status: DC
Start: 2015-03-31 — End: 2015-04-02
  Administered 2015-03-31 – 2015-04-01 (×2): 20 [IU] via SUBCUTANEOUS
  Filled 2015-03-31 (×4): qty 0.4

## 2015-03-31 MED ORDER — VITAMIN D 1000 UNITS PO TABS
1000.0000 [IU] | ORAL_TABLET | Freq: Every day | ORAL | Status: DC
  Administered 2015-03-31 – 2015-04-10 (×8): 1000 [IU] via ORAL
  Filled 2015-03-31 (×10): qty 1

## 2015-03-31 MED ORDER — INSULIN ASPART 100 UNIT/ML ~~LOC~~ SOLN
0.0000 [IU] | Freq: Three times a day (TID) | SUBCUTANEOUS | Status: DC
  Administered 2015-04-01 (×2): 5 [IU] via SUBCUTANEOUS

## 2015-03-31 MED ORDER — CLONIDINE HCL 0.1 MG PO TABS
0.1000 mg | ORAL_TABLET | Freq: Three times a day (TID) | ORAL | Status: DC
  Administered 2015-03-31 – 2015-04-02 (×9): 0.1 mg via ORAL
  Filled 2015-03-31 (×11): qty 1

## 2015-03-31 MED ORDER — HEPARIN SODIUM (PORCINE) 1000 UNIT/ML DIALYSIS
1000.0000 [IU] | INTRAMUSCULAR | Status: DC | PRN
  Filled 2015-03-31: qty 1

## 2015-03-31 MED ORDER — LOSARTAN POTASSIUM 50 MG PO TABS
100.0000 mg | ORAL_TABLET | Freq: Every day | ORAL | Status: DC
  Administered 2015-03-31 – 2015-04-02 (×3): 100 mg via ORAL
  Filled 2015-03-31 (×3): qty 2

## 2015-03-31 NOTE — Progress Notes (Signed)
Received order to wean off O2 @ 10Lpm Per [place] mask.  Patient denies Shortness of breath on room air.  Patient assisted to ambulate to nurses station from room on room air to assess O2 Saturation.  O2 Saturation 90% during ambulation on room air.  Patient assisted back to bed.  Placed O2 @ 2lmp via [place].  Will assist in ambulating in one hour on room air and will document.

## 2015-03-31 NOTE — H&P (Signed)
[facility] History and Physical  [name] [id number] DOB: [birth year]    PCP:   Geroge Baseman   Chief Complaint: SOB  HPI: [name] is an 64 y.o. male with hx of ESRD on HD, IDDM, CAD s/p PCI, presented to the ER with SOB.  CXR showed vascular congestion, and he was found to be volume overload.  He was given NTP, and labetelol for his HTN.  He has no chest pain, fever or chills.  Work up in the ER showed normal K, and Cr near 13.  He needs urgent dialysis.  Dr Burnett Sheng was consulted by EDP, and he came in tonight to see patient, and planned for emergent dialysis.  He was given neb tx and oxygen, and his breathing has improved.  Hospitalist was asked to admit him for same.   Rewiew of Systems:  Constitutional: Negative for malaise, fever and chills. No significant weight loss or weight gain Eyes: Negative for eye pain, redness and discharge, diplopia, visual changes, or flashes of light. ENMT: Negative for ear pain, hoarseness, nasal congestion, sinus pressure and sore throat. No headaches; tinnitus, drooling, or problem swallowing. Cardiovascular: Negative for chest pain, palpitations, diaphoresis,  and peripheral edema. ; No orthopnea, PND Respiratory: Negative for cough, hemoptysis, wheezing and stridor. No pleuritic chestpain. Gastrointestinal: Negative for nausea, vomiting, diarrhea, constipation, abdominal pain, melena, blood in stool, hematemesis, jaundice and rectal bleeding.    Genitourinary: Negative for frequency, dysuria, incontinence,flank pain and hematuria; Musculoskeletal: Negative for back pain and neck pain. Negative for swelling and trauma.;  Skin: . Negative for pruritus, rash, abrasions, bruising and skin lesion.; ulcerations Neuro: Negative for headache, lightheadedness and neck stiffness. Negative for weakness, altered level of consciousness , altered mental status, extremity weakness, burning feet, involuntary movement, seizure and syncope.   Psych: negative for anxiety, depression, insomnia, tearfulness, panic attacks, hallucinations, paranoia, suicidal or homicidal ideation   Past Medical History  Diagnosis Date  . Hypertension   . Diabetes mellitus without complication ([place])   . Kidney infection   . MI (mitral incompetence)   . CAD (coronary artery disease)     STEMI with LAD stent by C Granger in 2005  . Seasonal allergies   . Depression   . History of kidney stones   . Arthritis   . Myocardial infarction [facility])     Past Surgical History  Procedure Laterality Date  . Back surgery    . Coronary angioplasty with stent placement  2008  . Spine surgery    . Eye surgery Right     lens implant  . Colonoscopy w/ polypectomy    . Insertion of dialysis catheter Right 01/30/2014    Procedure: INSERTION OF DIALYSIS CATHETER-RIGHT INTERNAL JUGULAR;  Surgeon: Elam Dutch, MD;  Location: [place];  Service: Vascular;  Laterality: Right;  . Av fistula placement Left 01/30/2014    Procedure: INSERTION OF ARTERIOVENOUS (AV) GORE-TEX GRAFT LEFT UPPER ARM;  Surgeon: Elam Dutch, MD;  Location: [place];  Service: Vascular;  Laterality: Left;    Medications:  HOME MEDS: Prior to Admission medications   Medication Sig Start Date End Date Taking? Authorizing Provider  amLODipine (NORVASC) 10 MG tablet Take 10 mg by mouth daily.  01/13/14  Yes Historical Provider, MD  calcium acetate (PHOSLO) 667 MG capsule Take 667 mg by mouth daily.   Yes Historical Provider, MD  carvedilol (COREG) 25 MG tablet Take 1 tablet (25 mg total) by mouth 2 (two) times daily with a meal. 03/21/15  Yes Kathie Dike, MD  Cholecalciferol 1000 UNITS tablet Take 1,000 Units by mouth daily.   Yes Historical Provider, MD  cinacalcet (SENSIPAR) 30 MG tablet Take 30 mg by mouth 2 (two) times daily with a meal.   Yes Historical Provider, MD  cloNIDine (CATAPRES) 0.1 MG tablet Take 0.1 mg by mouth 3 (three) times daily.  11/07/13  Yes Historical Provider, MD   Ferric Citrate (AURYXIA) 1 GM 210 MG(FE) TABS Take 1 tablet by mouth 3 (three) times daily with meals.   Yes Historical Provider, MD  insulin glargine (LANTUS) 100 UNIT/ML injection Inject 40 Units into the skin at bedtime.    Yes Historical Provider, MD  losartan (COZAAR) 100 MG tablet Take 1 tablet (100 mg total) by mouth daily. 03/21/15  Yes Kathie Dike, MD  Multiple Vitamin (DAILY VITAMIN PO) Take 1 tablet by mouth daily.   Yes Historical Provider, MD     Allergies:  Allergies  Allergen Reactions  . Shellfish Allergy Hives    Social History:   reports that he has never smoked. He quit smokeless tobacco use about 4 years ago. His smokeless tobacco use included Chew. He reports that he does not drink alcohol or use illicit drugs.  Family History: Family History  Problem Relation Age of Onset  . Diabetes Other   . Diabetes Mother   . Hypertension Mother   . Diabetes Father   . Hypertension Father      Physical Exam: Filed Vitals:   03/31/15 0000 03/31/15 0100 03/31/15 0124 03/31/15 0130  BP: 227/108 179/91  173/88  Pulse: 103   87  Temp:      Resp: 27 22  24   Height:      Weight:      SpO2: 95%  100% 100%   Blood pressure 173/88, pulse 87, temperature 98.1 F (36.7 C), resp. rate 24, height 6\' 5"  (1.956 m), weight 97.3 kg (214 lb 8.1 oz), SpO2 100 %.  GEN:  Pleasant  patient lying in the stretcher in no acute distress; cooperative with exam. PSYCH:  alert and oriented x4; does not appear anxious or depressed; affect is appropriate. HEENT: Mucous membranes pink and anicteric; PERRLA; EOM intact; no cervical lymphadenopathy nor thyromegaly or carotid bruit; no JVD; There were no stridor. Neck is very supple. Breasts:: Not examined CHEST WALL: No tenderness CHEST: Normal respiration, he has wheezing and bilateral rales.  HEART: Regular rate and rhythm.  There are no murmur, rub, or gallops.   BACK: No kyphosis or scoliosis; no CVA tenderness ABDOMEN: soft and  non-tender; no masses, no organomegaly, normal abdominal bowel sounds; no pannus; no intertriginous candida. There is no rebound and no distention. Rectal Exam: Not done EXTREMITIES: No bone or joint deformity; age-appropriate arthropathy of the hands and knees; no edema; no ulcerations.  There is no calf tenderness.  AVF with thrills.  Genitalia: not examined PULSES: 2+ and symmetric SKIN: Normal hydration no rash or ulceration CNS: Cranial nerves 2-12 grossly intact no focal lateralizing neurologic deficit.  Speech is fluent; uvula elevated with phonation, facial symmetry and tongue midline. DTR are normal bilaterally, cerebella exam is intact, barbinski is negative and strengths are equaled bilaterally.  No sensory loss.   Labs on Admission:  Basic Metabolic Panel:  Recent Labs Lab 03/30/15 2333  NA 139  K 4.0  CL 97*  CO2 26  GLUCOSE 166*  BUN 81*  CREATININE 12.72*  CALCIUM 9.2   CBC:  Recent Labs Lab 03/30/15 2333  WBC  12.8*  NEUTROABS 10.6*  HGB 10.2*  HCT 30.0*  MCV 94.9  PLT 233    Radiological Exams on Admission: Dg Chest 2 View  03/31/2015  CLINICAL DATA:  Acute onset of shortness of breath. Initial encounter. EXAM: CHEST  2 VIEW COMPARISON:  Chest radiograph performed 03/21/2015 FINDINGS: The lungs are well-aerated. Vascular congestion is noted, with bilateral central airspace opacities, concerning for mild pulmonary edema. There is no evidence of pleural effusion or pneumothorax. The heart is normal in size; the mediastinal contour is within normal limits. No acute osseous abnormalities are seen. IMPRESSION: Vascular congestion, with bilateral central airspace opacities, concerning for mild pulmonary edema. Electronically Signed   By: Garald Balding M.D.   On: 03/31/2015 00:25   Assessment/Plan Present on Admission:  . Fluid overload . SOB (shortness of breath) . Type 2 diabetes mellitus with diabetic nephropathy ([place]) . Acute on chronic renal failure  ([place]) . Essential hypertension . Volume overload  PLAN: Patient will be admitted OBS and proceed to have dialysis emergently.  Dr Burnett Sheng has already seen him and will proceed with HD.  I have continued all his meds.  He is stable, full code, and will be admitted OBS under [facility] service.  Thank you.   Other plans as per orders.  Code Status: FULL Haskel Khan, MD. [facility] Pager [phone] 7pm to 7am.  03/31/2015, 1:58 AM

## 2015-03-31 NOTE — ED Provider Notes (Signed)
CSN: [id number]     Arrival date & time 03/30/15  2231 History   First MD Initiated Contact with Patient 03/30/15 2255     Chief Complaint  Patient presents with  . Shortness of Breath     (Consider location/radiation/quality/duration/timing/severity/associated sxs/prior Treatment) HPI  Patient states he gets dialysis on Monday (tomorrow), Wednesday and Friday. He goes to [facility] for dialysis. He states everything went fine on Friday. He states 2 hours ago while watching TV he acutely felt short of breath and had some light chest pain across his lower chest and upper abdomen. He states his abdomen feels tight. He states he has felt that way before when he had fluid buildup. He states he started coughing tonight and has clear mucus. He states he has mild swelling of his extremities. He reports he had some diarrhea this morning. He states he may not have that followed his diet this weekend. He states he ate 2 hot dogs today.   PCP PA [place] in [facility] Dr Rosalyn Gess in [place]  Past Medical History  Diagnosis Date  . Hypertension   . Diabetes mellitus without complication ([place])   . Kidney infection   . MI (mitral incompetence)   . CAD (coronary artery disease)     STEMI with LAD stent by C Granger in 2005  . Seasonal allergies   . Depression   . History of kidney stones   . Arthritis   . Myocardial infarction [facility])    Past Surgical History  Procedure Laterality Date  . Back surgery    . Coronary angioplasty with stent placement  2008  . Spine surgery    . Eye surgery Right     lens implant  . Colonoscopy w/ polypectomy    . Insertion of dialysis catheter Right 01/30/2014    Procedure: INSERTION OF DIALYSIS CATHETER-RIGHT INTERNAL JUGULAR;  Surgeon: Elam Dutch, MD;  Location: [place];  Service: Vascular;  Laterality: Right;  . Av fistula placement Left 01/30/2014    Procedure: INSERTION OF ARTERIOVENOUS (AV) GORE-TEX GRAFT LEFT UPPER ARM;  Surgeon: Elam Dutch, MD;  Location[facility] OR;  Service: Vascular;  Laterality: Left;   Family History  Problem Relation Age of Onset  . Diabetes Other   . Diabetes Mother   . Hypertension Mother   . Diabetes Father   . Hypertension Father    Social History  Substance Use Topics  . Smoking status: Never Smoker   . Smokeless tobacco: Former Systems developer    Types: [place] date: 06/14/2010  . Alcohol Use: No    Review of Systems  All other systems reviewed and are negative.     Allergies  Shellfish allergy  Home Medications   Prior to Admission medications   Medication Sig Start Date End Date Taking? Authorizing Provider  amLODipine (NORVASC) 10 MG tablet Take 10 mg by mouth daily.  01/13/14  Yes Historical Provider, MD  calcium acetate (PHOSLO) 667 MG capsule Take 667 mg by mouth daily.   Yes Historical Provider, MD  carvedilol (COREG) 25 MG tablet Take 1 tablet (25 mg total) by mouth 2 (two) times daily with a meal. 03/21/15  Yes Kathie Dike, MD  Cholecalciferol 1000 UNITS tablet Take 1,000 Units by mouth daily.   Yes Historical Provider, MD  cinacalcet (SENSIPAR) 30 MG tablet Take 30 mg by mouth 2 (two) times daily with a meal.   Yes Historical Provider, MD  cloNIDine (CATAPRES) 0.1 MG tablet Take 0.1  mg by mouth 3 (three) times daily.  11/07/13  Yes Historical Provider, MD  Ferric Citrate (AURYXIA) 1 GM 210 MG(FE) TABS Take 1 tablet by mouth 3 (three) times daily with meals.   Yes Historical Provider, MD  insulin glargine (LANTUS) 100 UNIT/ML injection Inject 40 Units into the skin at bedtime.    Yes Historical Provider, MD  losartan (COZAAR) 100 MG tablet Take 1 tablet (100 mg total) by mouth daily. 03/21/15  Yes Kathie Dike, MD  Multiple Vitamin (DAILY VITAMIN PO) Take 1 tablet by mouth daily.   Yes Historical Provider, MD   BP 227/108 mmHg  Pulse 103  Temp(Src) 98.1 F (36.7 C)  Resp 27  Ht 6\' 5"  (1.956 m)  Wt 214 lb 8.1 oz (97.3 kg)  BMI 25.43 kg/m2  SpO2 95%  Vital signs  normal except hypertension   Physical Exam  Constitutional: He is oriented to person, place, and time. He appears well-developed and well-nourished.  Non-toxic appearance. He does not appear ill. No distress.  HENT:  Head: Normocephalic and atraumatic.  Right Ear: External ear normal.  Left Ear: External ear normal.  Nose: Nose normal. No mucosal edema or rhinorrhea.  Mouth/Throat: Oropharynx is clear and moist and mucous membranes are normal. No dental abscesses or uvula swelling.  Eyes: Conjunctivae and EOM are normal. Pupils are equal, round, and reactive to light.  Neck: Normal range of motion and full passive range of motion without pain. Neck supple.  Cardiovascular: Normal rate, regular rhythm and normal heart sounds.  Exam reveals no gallop and no friction rub.   No murmur heard. Pulmonary/Chest: Effort normal. No respiratory distress. He has no wheezes. He has no rhonchi. He has rales. He exhibits no tenderness and no crepitus.  Diffuse bilateral rales, During my exam patient's pulse ox was 88% on 3 L/m nasal cannula. Nurse reports when he placed patient in the patient's room his pulse ox was 82% on room air after ambulation. In triage his pulse ox was only 91% on room air.  Abdominal: Soft. Normal appearance and bowel sounds are normal. He exhibits no distension. There is no tenderness. There is no rebound and no guarding.  Musculoskeletal: Normal range of motion. He exhibits no edema or tenderness.  Moves all extremities well.   Neurological: He is alert and oriented to person, place, and time. He has normal strength. No cranial nerve deficit.  Skin: Skin is warm, dry and intact. No rash noted. No erythema. No pallor.  Psychiatric: He has a normal mood and affect. His speech is normal and behavior is normal. His mood appears not anxious.  Nursing note and vitals reviewed.   ED Course  Procedures (including critical care time) Medications  labetalol (NORMODYNE,TRANDATE)  injection 20 mg (not administered)  nitroGLYCERIN (NITROGLYN) 2 % ointment 1 inch (1 inch Topical Given 03/30/15 2343)  albuterol (PROVENTIL) (2.5 MG/3ML) 0.083% nebulizer solution 2.5 mg (2.5 mg Nebulization Given 03/30/15 2350)  ipratropium-albuterol (DUONEB) 0.5-2.5 (3) MG/3ML nebulizer solution 3 mL (3 mLs Nebulization Given 03/30/15 2349)   Patient had nitroglycerin paste applied to help control his blood pressure and to help with his suspected pulmonary edema. He was given an albuterol nebulizer for shortness of breath and if his potassium was high it would also help that.   Recheck at 1 AM patient states his breathing is better. He is now on a Ventimask with pulse ox 100%. On exam of his lungs he still has diffuse rales but improved air movement. I am  waiting for the nephrologist on call to call me back.  01:18 Dr Lowanda Foster, have hospitalist admit, will decide if he will dialyze tonight or in am. Once his blood pressure to be under better control. Labetalol IV was ordered however the nursing staff point out his blood pressure improved to the 170 range without the labetalol being given.  01:25 Dr Marin Comment, will admit  Dr. Lowanda Foster came to the ED to evaluate patient and is going to do emergent dialysis.  Labs Review Results for orders placed or performed during the hospital encounter of 03/30/15  CBC with Differential  Result Value Ref Range   WBC 12.8 (H) 4.0 - 10.5 K/uL   RBC 3.16 (L) 4.22 - 5.81 MIL/uL   Hemoglobin 10.2 (L) 13.0 - 17.0 g/dL   HCT 30.0 (L) 39.0 - 52.0 %   MCV 94.9 78.0 - 100.0 fL   MCH 32.3 26.0 - 34.0 pg   MCHC 34.0 30.0 - 36.0 g/dL   RDW 13.7 11.5 - 15.5 %   Platelets 233 150 - 400 K/uL   Neutrophils Relative % 83 %   Neutro Abs 10.6 (H) 1.7 - 7.7 K/uL   Lymphocytes Relative 11 %   Lymphs Abs 1.4 0.7 - 4.0 K/uL   Monocytes Relative 3 %   Monocytes Absolute 0.4 0.1 - 1.0 K/uL   Eosinophils Relative 3 %   Eosinophils Absolute 0.4 0.0 - 0.7 K/uL   Basophils  Relative 0 %   Basophils Absolute 0.0 0.0 - 0.1 K/uL  Basic metabolic panel  Result Value Ref Range   Sodium 139 135 - 145 mmol/L   Potassium 4.0 3.5 - 5.1 mmol/L   Chloride 97 (L) 101 - 111 mmol/L   CO2 26 22 - 32 mmol/L   Glucose, Bld 166 (H) 65 - 99 mg/dL   BUN 81 (H) 6 - 20 mg/dL   Creatinine, Ser 12.72 (H) 0.61 - 1.24 mg/dL   Calcium 9.2 8.9 - 10.3 mg/dL   GFR calc non Af Amer 4 (L) >60 mL/min   GFR calc Af Amer 4 (L) >60 mL/min   Anion gap 16 (H) 5 - 15  Protime-INR  Result Value Ref Range   Prothrombin Time 14.5 11.6 - 15.2 seconds   INR 1.11 0.00 - 1.49   Laboratory interpretation all normal except mild anemia, renal failure   Imaging Review Dg Chest 2 View  03/31/2015  CLINICAL DATA:  Acute onset of shortness of breath. Initial encounter. EXAM: CHEST  2 VIEW COMPARISON:  Chest radiograph performed 03/21/2015 FINDINGS: The lungs are well-aerated. Vascular congestion is noted, with bilateral central airspace opacities, concerning for mild pulmonary edema. There is no evidence of pleural effusion or pneumothorax. The heart is normal in size; the mediastinal contour is within normal limits. No acute osseous abnormalities are seen. IMPRESSION: Vascular congestion, with bilateral central airspace opacities, concerning for mild pulmonary edema. Electronically Signed   By: Garald Balding M.D.   On: 03/31/2015 00:25   I have personally reviewed and evaluated these images and lab results as part of my medical decision-making.   EKG Interpretation   Date/Time:  Sunday March 30 2015 22:40:25 EDT Ventricular Rate:  96 PR Interval:  200 QRS Duration: 164 QT Interval:  394 QTC Calculation: 497 R Axis:   126 Text Interpretation:   Poor data quality, interpretation may be  adversely affected Normal sinus rhythm Right bundle branch block No  significant change since last tracing 21 Mar 2015 Confirmed by [facility]  MD-I, Lonn Im ([id number]) on 03/30/2015 11:06:11 PM      MDM   Final  diagnoses:  ESRD on dialysis ([place])  Other fluid overload  Hypoxia  Secondary hypertension, unspecified    Plan admission  Rolland Porter, MD, FACEP  CRITICAL CARE Performed by: Rolland Porter L Total critical care time: 32 min Critical care time was exclusive of separately billable procedures and treating other patients. Critical care was necessary to treat or prevent imminent or life-threatening deterioration. Critical care was time spent personally by me on the following activities: development of treatment plan with patient and/or surrogate as well as nursing, discussions with consultants, evaluation of patient's response to treatment, examination of patient, obtaining history from patient or surrogate, ordering and performing treatments and interventions, ordering and review of laboratory studies, ordering and review of radiographic studies, pulse oximetry and re-evaluation of patient's condition.     Rolland Porter, MD 03/31/15 [phone]

## 2015-03-31 NOTE — Care Management Note (Signed)
Case Management Note  Patient Details  Name: [name] MRN: [id number] Date of Birth: July 30, 1950  Subjective/Objective:                  Pt admitted from home with fluid overload and SOB. Pt lives with his wife and will return home at discharge. Pt is independent with ADL's. Pt receives dialysis at [facility] in [place].  Action/Plan: No CM needs noted.  Expected Discharge Date:                  Expected Discharge Plan:  Home/Self Care  In-House Referral:  NA  Discharge planning Services  CM Consult  Post Acute Care Choice:  NA Choice offered to:  NA  DME Arranged:    DME Agency:     HH Arranged:    HH Agency:     Status of Service:  Completed, signed off  Medicare Important Message Given:    Date Medicare IM Given:    Medicare IM give by:    Date Additional Medicare IM Given:    Additional Medicare Important Message give by:     If discussed at [place] of Stay Meetings, dates discussed:    Additional Comments:  Joylene Draft, RN 03/31/2015, 2:12 PM

## 2015-03-31 NOTE — Progress Notes (Addendum)
ANTICOAGULATION CONSULT NOTE - Initial Consult  Pharmacy Consult for Heparin Indication: chest pain/ACS  Allergies  Allergen Reactions  . Shellfish Allergy Hives    Patient Measurements: Height: 6\' 5"  (195.6 cm) Weight: 212 lb 8.4 oz (96.4 kg) IBW/kg (Calculated) : 89.1 Heparin Dosing Weight: 96.4  Vital Signs: Temp: 98.5 F (36.9 C) (10/17 1539) Temp Source: Oral (10/17 1539) BP: 155/75 mmHg (10/17 1539) Pulse Rate: 81 (10/17 1539)  Labs:  Recent Labs  03/30/15 2333 03/31/15 0353 03/31/15 1312  HGB 10.2* 8.9*  --   HCT 30.0* 25.5*  --   PLT 233 199  --   LABPROT 14.5  --   --   INR 1.11  --   --   CREATININE 12.72* 13.68*  --   TROPONINI  --   --  13.87*    Estimated Creatinine Clearance: 7 mL/min (by C-G formula based on Cr of 13.68).   Medical History: Past Medical History  Diagnosis Date  . Hypertension   . Diabetes mellitus without complication ([place])   . Kidney infection   . MI (mitral incompetence)   . CAD (coronary artery disease)     STEMI with LAD stent by C Granger in 2005  . Seasonal allergies   . Depression   . History of kidney stones   . Arthritis   . Myocardial infarction [facility])     Medications:  Prescriptions prior to admission  Medication Sig Dispense Refill Last Dose  . amLODipine (NORVASC) 10 MG tablet Take 10 mg by mouth daily.    03/30/2015 at Unknown time  . calcium acetate (PHOSLO) 667 MG capsule Take 667 mg by mouth daily.   03/30/2015 at Unknown time  . carvedilol (COREG) 25 MG tablet Take 1 tablet (25 mg total) by mouth 2 (two) times daily with a meal.   03/30/2015 at 2130  . Cholecalciferol 1000 UNITS tablet Take 1,000 Units by mouth daily.   03/30/2015 at Unknown time  . cinacalcet (SENSIPAR) 30 MG tablet Take 30 mg by mouth 2 (two) times daily with a meal.   03/30/2015 at Unknown time  . cloNIDine (CATAPRES) 0.1 MG tablet Take 0.1 mg by mouth 3 (three) times daily.    03/30/2015 at Unknown time  . Ferric Citrate (AURYXIA)  1 GM 210 MG(FE) TABS Take 1 tablet by mouth 3 (three) times daily with meals.   03/30/2015 at Unknown time  . insulin glargine (LANTUS) 100 UNIT/ML injection Inject 40 Units into the skin at bedtime.    03/29/2015 at Unknown time  . losartan (COZAAR) 100 MG tablet Take 1 tablet (100 mg total) by mouth daily.   03/30/2015 at Unknown time  . Multiple Vitamin (DAILY VITAMIN PO) Take 1 tablet by mouth daily.   03/30/2015 at Unknown time    Assessment: 64yo poorly controlled diabetic on chronic dialysis who complained of tightness in chest last night tr=13.9. Known history of CAD. No cardiology f/u since 2005 when he had LAD stent at [facility]. Initiate heparin for ACS/Stemi. Plan to transfer to [facility].  Goal of Therapy:  Heparin level 0.3-0.7 units/ml Monitor platelets by anticoagulation protocol: Yes   Plan:  Give 4000 units bolus x 1 Start heparin infusion at 1200 units/hr Check anti-Xa level in 6 hours and daily while on heparin Continue to monitor H&H and platelets   Isac Sarna, BS [place]ena [place], BCPS Clinical Pharmacist Pager [phone]  03/31/2015,3:44 PM

## 2015-03-31 NOTE — Care Management Note (Signed)
Case Management Note  Patient Details  Name: Jacson Virola MRN: [id number] Date of Birth: 03-Aug-1950  Subjective/Objective:                    Action/Plan:   Expected Discharge Date:                  Expected Discharge Plan:  Home/Self Care  In-House Referral:  NA  Discharge planning Services  CM Consult  Post Acute Care Choice:  NA Choice offered to:  NA  DME Arranged:    DME Agency:     HH Arranged:    [place] Agency:     Status of Service:  Completed, signed off  Medicare Important Message Given:    Date Medicare IM Given:    Medicare IM give by:    Date Additional Medicare IM Given:    Additional Medicare Important Message give by:     If discussed at [place] of Stay Meetings, dates discussed:    Additional Comments: Pt does not qualify for home O2 at this time. Christinia Gully [place], RN 03/31/2015, 2:13 PM

## 2015-03-31 NOTE — Progress Notes (Signed)
Patient ID: [name], male   DOB: [birth year], 64 y.o.   MRN: [id number] See full consult note by PA:  64 y.o. with distant history of LAD stent at [facility] in 2005.  No cardiology f/u since. Most of his doctors in [place].  Poorly controlled diabetic On chronic dialysis with LUE fistula.  Admitted 10/7 with pulmonary edema troponin negative ECG chronic RBBB no echo done.  Now readmitted With pulmonary edema improved with dialysis.  Also had tightness in chest last night.  Troponin 13.  ECG still with RBBB no acute changes Given known history of disease ongoing poorly controlled risk factors and two recent admission with pulmonary edema favor transfer to [facility] For right and left cath.  Echo has been ordered  Start heparin and continue beta blocker and aspirin.  Would get echo done with dialysis in  Am and plan cath in afternoon.  Shell fish allergy can give prednisone/benedryl.  Also quite anemic with normal ferritin 04/2014 Consider transfusion In setting of ischemia or starting aranasp or other erythropoietin stimulant  Exam remarkable for SEM  Basilar rales and LUE fistual with thrill  Discussed transfer to [facility] and cath with patient Risks including stroke, MI, and need for emergency surgery discussed Willing to proceed Dr Sarajane Jews aware and will transfer to hospitalist service at [facility] and notify renal.    Jenkins Rouge

## 2015-03-31 NOTE — Progress Notes (Signed)
Pt was taken to dialysis immediately upon arrival on floor.  He remains there at this time.

## 2015-03-31 NOTE — Consult Note (Signed)
CARDIOLOGY CONSULT NOTE   Patient ID: [name] MRN: [id number] DOB/AGE: 10/31/1950 64 y.o.  Admit Date: 03/30/2015 Referring Physician:PTH-Goodrich, Quillian Quince MD Primary Physician: Geroge Baseman Consulting Cardiologist: Jenkins Rouge MD Primary Cardiologist: New Reason for Consultation: Elevated Troponin with known hx of CAD  Clinical Summary Mr. Habenicht is a 64 y.o.male with known history of CAD, s/p PCI to LAD in 2006 at [facility], who has been followed by [facility], Dr. Romie Jumper, Talbert Cage, (last noted in 2013) per Care Everywhere. He had nuclear stress test which revealed ischemia and probable subtle scare involving the apical lateral, basal anterolateral and mid anterolateral segments per his last note dated 03/15/2012. Other history includes ESRD on dialysis, diabetes and hypertension.   He presented to ER on 03/31/2015 with dyspnea and respiratory distress, with CXR revealing pulmonary edema. He was given urgent dialysis last night. EKG demonstrated RBBB unchanged from prior EKG in May of 2016.    Troponin was ordered and found to be elevated at 13.87, with previous troponin on 03/21/2015 at <0.03. WBC elevated at 12.8. He is anemic with Hgb 8.9,. He was found to be very hypertensive on presentation BP 227/108. Creatinine 13.68. He has an allergy to SHELL FISH.   He states that he missed dialysis last week and ate salty foods.   Allergies  Allergen Reactions  . Shellfish Allergy Hives    Medications Scheduled Medications: . amLODipine  10 mg Oral Daily  . calcium acetate  667 mg Oral Q breakfast  . carvedilol  25 mg Oral BID WC  . cholecalciferol  1,000 Units Oral Daily  . cinacalcet  30 mg Oral BID WC  . cloNIDine  0.1 mg Oral TID  . Ferric Citrate  1 tablet Oral TID WC  . insulin glargine  40 Units Subcutaneous QHS  . labetalol  20 mg Intravenous Once  . losartan  100 mg Oral Daily  . multivitamin with minerals   Oral Daily    Infusions:    PRN  Medications: sodium chloride, sodium chloride, alteplase, heparin, heparin, lidocaine (PF), lidocaine-prilocaine, pentafluoroprop-tetrafluoroeth   Past Medical History  Diagnosis Date  . Hypertension   . Diabetes mellitus without complication ([place])   . Kidney infection   . MI (mitral incompetence)   . CAD (coronary artery disease)     STEMI with LAD stent by [facility] in 2005  . Seasonal allergies   . Depression   . History of kidney stones   . Arthritis   . Myocardial infarction [facility])     Past Surgical History  Procedure Laterality Date  . Back surgery    . Coronary angioplasty with stent placement  2008  . Spine surgery    . Eye surgery Right     lens implant  . Colonoscopy w/ polypectomy    . Insertion of dialysis catheter Right 01/30/2014    Procedure: INSERTION OF DIALYSIS CATHETER-RIGHT INTERNAL JUGULAR;  Surgeon: Elam Dutch, MD;  Location: [place];  Service: Vascular;  Laterality: Right;  . Av fistula placement Left 01/30/2014    Procedure: INSERTION OF ARTERIOVENOUS (AV) GORE-TEX GRAFT LEFT UPPER ARM;  Surgeon: Elam Dutch, MD;  Location: [facility] OR;  Service: Vascular;  Laterality: Left;    Family History  Problem Relation Age of Onset  . Diabetes Other   . Diabetes Mother   . Hypertension Mother   . Diabetes Father   . Hypertension Father     Social History Mr. Ketchum reports that he has never  smoked. He quit smokeless tobacco use about 4 years ago. His smokeless tobacco use included Chew. Mr. Jafari reports that he does not drink alcohol.  Review of Systems Complete review of systems are found to be negative unless outlined in H&P above.  Physical Examination Blood pressure 170/82, pulse 88, temperature 98.6 F (37 C), temperature source Oral, resp. rate 18, height _0  (1.956 m), weight 212 lb 8.4 oz (96.4 kg), SpO2 94 %.  Intake/Output Summary (Last 24 hours) at 03/31/15 1501 Last data filed at 03/31/15 1230  Gross per 24 hour  Intake    120  ml  Output   3625 ml  Net  -3505 ml    Telemetry: SR with RBBB  GEN:No acute distress.  HEENT: Conjunctiva and lids normal, oropharynx clear with moist mucosa. Neck: Supple, no elevated JVP or carotid bruits, no thyromegaly. Lungs: Bibasilar crackles.  Cardiac: Regular rate and rhythm, 2/6 systolic murmur. no S3 or significant systolic murmur, no pericardial rub. Abdomen: Soft, nontender, no hepatomegaly, bowel sounds present, no guarding or rebound. Extremities: No pitting edema, distal pulses 2+.Dialysis catheter in the left arm.  Skin: Warm and dry. Musculoskeletal: No kyphosis. Neuropsychiatric: Alert and oriented x3, affect grossly appropriate.  Prior Cardiac Testing/Procedures NM study 02/16/2012 PERTINENT DIAGNOSTIC TESTS: SPECT/CT exercise cardiac stress test (02/16/2012): - Abnormal myocardial perfusion study  - There is a moderate in size, mild in severity defect involving the apical lateral, basal  anterolateral and mid anterolateral segments consistent with probable ischemia and possible subtle  scar.  - Global systolic function is normal. The EF is greater than 65%.  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 XB 3.5 guide. BMW wire. Lesion predilated with a2.5x60m sprinter to 8 Atm and stented with a 3.5x198mCypher to 11 Atm with good results. the lesionin mLCX was 50% and we decided not to pursue that as the distal LCX was small and did  not give rise to any other significant branches.  ELECTRONIC COPY OF A PREVIOUSLY APPROVED REPORT. LaMaylon PeppersMD, was personally present as the attending physician throughout the entire procedure.    Lab Results  Basic Metabolic Panel:  Recent Labs Lab 03/30/15 2333 03/31/15 0353  NA 139 135  K 4.0 4.2  CL 97* 97*  CO2 26 24  GLUCOSE 166* 189*  BUN 81* 87*  CREATININE 12.72* 13.68*  CALCIUM 9.2 8.9  PHOS  --  6.3*    Liver Function Tests:  Recent Labs Lab 03/31/15 0353  ALBUMIN 3.5    CBC:  Recent Labs Lab 03/30/15 2333 03/31/15 0353  WBC 12.8* 11.3*  NEUTROABS 10.6*  --   HGB 10.2* 8.9*  HCT 30.0* 25.5*  MCV 94.9 94.1  PLT 233 199    Cardiac Enzymes:  Recent Labs Lab 03/31/15 1312  TROPONINI 13.87*    BNP: 569.    Radiology: Dg Chest 2 View  03/31/2015  CLINICAL DATA:  Acute onset of shortness of breath. Initial encounter. EXAM: CHEST  2 VIEW COMPARISON:  Chest radiograph performed 03/21/2015 FINDINGS: The lungs are well-aerated. Vascular congestion is noted, with bilateral central airspace opacities, concerning for mild pulmonary edema. There is no evidence of pleural effusion or pneumothorax. The heart is normal in size; the mediastinal contour is within normal limits. No acute osseous abnormalities are seen. IMPRESSION: Vascular congestion, with bilateral central airspace opacities, concerning for mild pulmonary edema. Electronically Signed   By: Garald Balding M.D.   On: 03/31/2015 00:25     ECG: RBBB rate of 98 bpm.    Impression and Recommendations  1. NSTEMI: Troponin elevated to 13.87 on first set, but will continue to cycle. He has been placed on ASA 380m,. I will had statin, Lipitor 80 mg now and daily. [facility]. He has shellfish allergy and will be pre-medicated. He has an [facility] Continue BB Coreg 25 mg BID.   2. CAD: Hx of stent to  the LAD in 2005. [facility] NTG ointment is in place as well. He denies new chest pain. Came in with acute pulmonary edema.   3. ESRD: Missed dialysis last week and ate more salted foods. Had emergent dialysis last night. Continues to have some rales in the bases but is breathing well on O2. [facility]   4. Hypertensive Emergency: Multifactorial with need for dialysis and dietary non-compliance. Now on amlodipine,losartan,  NTG 2% paste, clonidine, coreg 287mBID.   5. Diabetes: [facility]  Signed: KaPhill MyronLawrence NP AAFlintstone10/17/2016, 3:01 PM Co-Sign MD  Please see separate progress note.  SEMI with recurrent pulmonary edema Distant LAD intervention poorly controlled DM  Start heparin [facility] Echo to assess EF  PeJenkins Rouge

## 2015-03-31 NOTE — Progress Notes (Addendum)
PROGRESS NOTE  [name] [id number] DOB: 24-Apr-1951 DOA: 03/30/2015 PCP: Geroge Baseman  Summary:  35 yom with PMH of ESRD on HD, IDDM, CAD s/p PCI, presented with SOB. CXR revealed vascular congestion, and he was found to be volume overload and acute hypoxic respiratory failure.Admitted for emergent HD.  Assessment/Plan: 1. Acute hypoxic respiratory failure, SOB secondary to volume overload, poor compliance with diet and missed HD 10/12, possible NSTEMI. Hypoxia resolved.  2. Elevated troponin, suspect NSTEMI; completely asymptomatic. EKG SR, RBBB, NSCSLT 03/21/2015.  3. ESRD. Emergent HD upon admission.  4. Accelerated HTN superimposed essential HTN. Secondary to volume overload. Improved.  5. Anemia of CKD, stable.  6. DM Type 2 with diabetic nephropathy Home medications held in the setting of kidney injury will continue SSI. 7. Murmur.    Stable, appears well.   Trend troponin. Echo to assess murmur and fxn given elevated troponin.  Continue Coreg, losartan; add high-dose Lipitor, ASA, heparin. Check lipid panel in AM.   ADDENDUM Discussed with Dr. Donavan Burnet transfer to [facility] for [facility] 10/17; given comordities will be on hospitalist service. Going to [facility] Dr. Thereasa Solo with cardiology on consult. Cardiology will plan premedication.   Code Status: Full DVT prophylaxis: SCDs Family Communication: Wife bedside. Discussed with patient who understands and has no concerns at this time. Disposition Plan: pending cardiology recommendations.  Murray Hodgkins, MD  [facility]  Pager [phone] If 7PM-7AM, please contact night-coverage at [email or web address], password [facility] 03/31/2015, 7:46 AM    Consultants:  Nephrology   Procedures:  HD 10/17  Antibiotics:    HPI/Subjective: Feels good, back to baseline. Was able to eat without difficulty and denies any chest pain or SOB. Breathing well. Had some chest tightness yesterday but no CP  today.  Objective: Filed Vitals:   03/31/15 0600 03/31/15 0630 03/31/15 0700 03/31/15 0730  BP: 190/99 188/99 190/101 155/86  Pulse: 81 85 83 82  Temp:      TempSrc:      Resp:      Height:      Weight:      SpO2:       No intake or output data in the 24 hours ending 03/31/15 0746   Filed Weights   03/30/15 2236 03/31/15 0304 03/31/15 0350  Weight: 97.3 kg (214 lb 8.1 oz) 96.752 kg (213 lb 4.8 oz) 96.4 kg (212 lb 8.4 oz)    Exam: Afebrile, mildly hypertensive, not hypoxic  General: Lying in bed and appears calm and comfortable Eyes: PERRL, normal lids, irises & conjunctiva ENT: grossly normal hearing, lips & tongue Cardiovascular: Irregular rhythm and regular rate, 2/6 systolic murmur LUSB no r/g. No LE edema.  Respiratory: CTA bilaterally, no w/r/r. Normal respiratory effort. Psychiatric: grossly normal mood and affect, speech fluent and appropriate  New data reviewed:  BMP consistent with ESRD   WBC improved 11.3  Hgb, stable 8.9  Potassium 4.2  Troponin 13.87  Pertinent data since admission:  CXR independently reviewed, revealed vascular congestion with bilateral central airspace opacities concerning for mild pulmonary edema.   Pending data:    Scheduled Meds: . amLODipine  10 mg Oral Daily  . calcium acetate  667 mg Oral Q breakfast  . carvedilol  25 mg Oral BID WC  . cholecalciferol  1,000 Units Oral Daily  . cinacalcet  30 mg Oral BID WC  . cloNIDine  0.1 mg Oral TID  . Ferric Citrate  1 tablet Oral TID WC  . insulin glargine  40 Units Subcutaneous QHS  .  labetalol  20 mg Intravenous Once  . losartan  100 mg Oral Daily  . multivitamin with minerals   Oral Daily   Continuous Infusions:   Principal Problem:   NSTEMI (non-ST elevated myocardial infarction) ([place]) Active Problems:   Type 2 diabetes mellitus with diabetic nephropathy (HCC)   Essential hypertension   Acute on chronic renal failure (HCC)   Fluid overload   SOB (shortness of  breath)   Volume overload   Time spent 35 minutes, >50% in counseling and coordination of care   By signing my name below, I, Rennis Harding attest that this documentation has been prepared under the direction and in the presence of Murray Hodgkins, MD Electronically signed: Rennis Harding  03/31/2015 12:05 PM   I personally performed the services described in this documentation. All medical record entries made by the scribe were at my direction. I have reviewed the chart and agree that the record reflects my personal performance and is accurate and complete. Murray Hodgkins, MD

## 2015-03-31 NOTE — Consult Note (Signed)
Reason for Consult: Fluid overload and end-stage renal disease Referring Physician: Dr.Knapp  [name] is an 64 y.o. male.  HPI: He is a patient with history of diabetes, hypertension, end-stage renal disease on maintenance hemodialysis presently came with complaints of some chest pain, difficulty breathing since last night. According to patient he missed his dialysis on Wednesday but went on Friday. He was dialyzed 4 hours. However yesterday his artery some difficulty breathing, orthopnea and paroxysmal nocturnal dyspnea hence decided to come to the emergency room. When he was evaluated and was found to have signs of fluid overload and also hypertension hence patient is admitted and called for consult for dialysis.  Past Medical History  Diagnosis Date  . Hypertension   . Diabetes mellitus without complication ([place])   . Kidney infection   . MI (mitral incompetence)   . CAD (coronary artery disease)     STEMI with LAD stent by [facility] in 2005  . Seasonal allergies   . Depression   . History of kidney stones   . Arthritis   . Myocardial infarction [facility])     Past Surgical History  Procedure Laterality Date  . Back surgery    . Coronary angioplasty with stent placement  2008  . Spine surgery    . Eye surgery Right     lens implant  . Colonoscopy w/ polypectomy    . Insertion of dialysis catheter Right 01/30/2014    Procedure: INSERTION OF DIALYSIS CATHETER-RIGHT INTERNAL JUGULAR;  Surgeon: Elam Dutch, MD;  Location: [place];  Service: Vascular;  Laterality: Right;  . Av fistula placement Left 01/30/2014    Procedure: INSERTION OF ARTERIOVENOUS (AV) GORE-TEX GRAFT LEFT UPPER ARM;  Surgeon: Elam Dutch, MD;  [facility] OR;  Service: Vascular;  Laterality: Left;    Family History  Problem Relation Age of Onset  . Diabetes Other   . Diabetes Mother   . Hypertension Mother   . Diabetes Father   . Hypertension Father     Social History:  reports that he has never  smoked. He quit smokeless tobacco use about 4 years ago. His smokeless tobacco use included Chew. He reports that he does not drink alcohol or use illicit drugs.  Allergies:  Allergies  Allergen Reactions  . Shellfish Allergy Hives    Medications: I have reviewed the patient's current medications.  Results for orders placed or performed during the hospital encounter of 03/30/15 (from the past 48 hour(s))  CBC with Differential     Status: Abnormal   Collection Time: 03/30/15 11:33 PM  Result Value Ref Range   WBC 12.8 (H) 4.0 - 10.5 K/uL   RBC 3.16 (L) 4.22 - 5.81 MIL/uL   Hemoglobin 10.2 (L) 13.0 - 17.0 g/dL   HCT 30.0 (L) 39.0 - 52.0 %   MCV 94.9 78.0 - 100.0 fL   MCH 32.3 26.0 - 34.0 pg   MCHC 34.0 30.0 - 36.0 g/dL   RDW 13.7 11.5 - 15.5 %   Platelets 233 150 - 400 K/uL   Neutrophils Relative % 83 %   Neutro Abs 10.6 (H) 1.7 - 7.7 K/uL   Lymphocytes Relative 11 %   Lymphs Abs 1.4 0.7 - 4.0 K/uL   Monocytes Relative 3 %   Monocytes Absolute 0.4 0.1 - 1.0 K/uL   Eosinophils Relative 3 %   Eosinophils Absolute 0.4 0.0 - 0.7 K/uL   Basophils Relative 0 %   Basophils Absolute 0.0 0.0 - 0.1 K/uL  Basic metabolic panel     Status: Abnormal   Collection Time: 03/30/15 11:33 PM  Result Value Ref Range   Sodium 139 135 - 145 mmol/L   Potassium 4.0 3.5 - 5.1 mmol/L   Chloride 97 (L) 101 - 111 mmol/L   CO2 26 22 - 32 mmol/L   Glucose, Bld 166 (H) 65 - 99 mg/dL   BUN 81 (H) 6 - 20 mg/dL   Creatinine, Ser 12.72 (H) 0.61 - 1.24 mg/dL   Calcium 9.2 8.9 - 10.3 mg/dL   GFR calc non Af Amer 4 (L) >60 mL/min   GFR calc Af Amer 4 (L) >60 mL/min    Comment: (NOTE) The eGFR has been calculated using the CKD EPI equation. This calculation has not been validated in all clinical situations. eGFR's persistently <60 mL/min signify possible Chronic Kidney Disease.    Anion gap 16 (H) 5 - 15  Protime-INR     Status: None   Collection Time: 03/30/15 11:33 PM  Result Value Ref Range    Prothrombin Time 14.5 11.6 - 15.2 seconds   INR 1.11 0.00 - 1.49    Dg Chest 2 View  03/31/2015  CLINICAL DATA:  Acute onset of shortness of breath. Initial encounter. EXAM: CHEST  2 VIEW COMPARISON:  Chest radiograph performed 03/21/2015 FINDINGS: The lungs are well-aerated. Vascular congestion is noted, with bilateral central airspace opacities, concerning for mild pulmonary edema. There is no evidence of pleural effusion or pneumothorax. The heart is normal in size; the mediastinal contour is within normal limits. No acute osseous abnormalities are seen. IMPRESSION: Vascular congestion, with bilateral central airspace opacities, concerning for mild pulmonary edema. Electronically Signed   By: Garald Balding M.D.   On: 03/31/2015 00:25    Review of Systems  Constitutional: Negative for fever and malaise/fatigue.  Respiratory: Positive for shortness of breath. Negative for cough and hemoptysis.   Cardiovascular: Positive for chest pain, orthopnea and PND.  Gastrointestinal: Negative for nausea and vomiting.   Blood pressure 173/88, pulse 87, temperature 98.1 F (36.7 C), resp. rate 24, height '6\' 5"'  (1.956 m), weight 214 lb 8.1 oz (97.3 kg), SpO2 100 %. Physical Exam  Constitutional: He is oriented to person, place, and time. No distress.  HENT:  Mouth/Throat: No oropharyngeal exudate.  Eyes: Left eye exhibits discharge.  Neck: No JVD present.  Cardiovascular: Normal rate and regular rhythm.   Respiratory: He has wheezes.  GI: He exhibits no distension. There is no tenderness. There is no rebound.  Musculoskeletal: He exhibits edema.  Neurological: He is alert and oriented to person, place, and time.    Assessment/Plan: Problem #1 difficulty breathing: Presently seems to be secondary to fluid overload. Patient was hypoxic and the on facemask presently his oxygen saturation is about 100%. Patient feels better and no chest pain. Problem #2 hypertension: His blood pressure is high but  better Problem #3 history of diabetes Problem #4anemia : His hemoglobin is within our target goal. Problem #5 metabolic bone disease: His calcium is range Problem #6 coronary artery disease Plan: 1] We'll make arrangements for patient to get dialysis for 4 hours 2] We'll use serial 3k/2.5 calcium bath 3] We'll try to remove about 3-1/2 L his blood pressure tolerates. 4] We'll use Epogen 40,000 units IV after each dialysis.  Joao Mccurdy S 03/31/2015, 1:45 AM

## 2015-04-01 ENCOUNTER — Encounter (HOSPITAL_COMMUNITY)
Admission: EM | Disposition: A | Discharge status: Home Health | Payer: Self-pay | Source: Home / Self Care | Attending: Cardiothoracic Surgery

## 2015-04-01 ENCOUNTER — Other Ambulatory Visit (HOSPITAL_COMMUNITY): Payer: Medicare Other

## 2015-04-01 DIAGNOSIS — I2510 Atherosclerotic heart disease of native coronary artery without angina pectoris: Secondary | ICD-10-CM

## 2015-04-01 DIAGNOSIS — I159 Secondary hypertension, unspecified: Secondary | ICD-10-CM | POA: Insufficient documentation

## 2015-04-01 DIAGNOSIS — I214 Non-ST elevation (NSTEMI) myocardial infarction: Principal | ICD-10-CM

## 2015-04-01 DIAGNOSIS — I5031 Acute diastolic (congestive) heart failure: Secondary | ICD-10-CM | POA: Insufficient documentation

## 2015-04-01 HISTORY — PX: CARDIAC CATHETERIZATION: SHX172

## 2015-04-01 LAB — RENAL FUNCTION PANEL
Albumin: 3.3 g/dL — ABNORMAL LOW (ref 3.5–5.0)
Anion gap: 16 — ABNORMAL HIGH (ref 5–15)
BUN: 74 mg/dL — ABNORMAL HIGH (ref 6–20)
CO2: 24 mmol/L (ref 22–32)
Calcium: 9 mg/dL (ref 8.9–10.3)
Chloride: 89 mmol/L — ABNORMAL LOW (ref 101–111)
Creatinine, Ser: 11.73 mg/dL — ABNORMAL HIGH (ref 0.61–1.24)
GFR calc Af Amer: 5 mL/min — ABNORMAL LOW (ref >60)
GFR calc non Af Amer: 4 mL/min — ABNORMAL LOW (ref >60)
Glucose, Bld: 264 mg/dL — ABNORMAL HIGH (ref 65–99)
Phosphorus: 6.1 mg/dL — ABNORMAL HIGH (ref 2.5–4.6)
Potassium: 4.6 mmol/L (ref 3.5–5.1)
Sodium: 129 mmol/L — ABNORMAL LOW (ref 135–145)

## 2015-04-01 LAB — BASIC METABOLIC PANEL
Anion gap: 13 (ref 5–15)
Anion gap: 15 (ref 5–15)
BUN: 55 mg/dL — ABNORMAL HIGH (ref 6–20)
BUN: 63 mg/dL — ABNORMAL HIGH (ref 6–20)
CO2: 28 mmol/L (ref 22–32)
CO2: 26 mmol/L (ref 22–32)
Calcium: 8.8 mg/dL — ABNORMAL LOW (ref 8.9–10.3)
Calcium: 8.7 mg/dL — ABNORMAL LOW (ref 8.9–10.3)
Chloride: 91 mmol/L — ABNORMAL LOW (ref 101–111)
Chloride: 89 mmol/L — ABNORMAL LOW (ref 101–111)
Creatinine, Ser: 10.01 mg/dL — ABNORMAL HIGH (ref 0.61–1.24)
Creatinine, Ser: 10.91 mg/dL — ABNORMAL HIGH (ref 0.61–1.24)
GFR calc Af Amer: 6 mL/min — ABNORMAL LOW (ref >60)
GFR calc Af Amer: 5 mL/min — ABNORMAL LOW (ref >60)
GFR calc non Af Amer: 5 mL/min — ABNORMAL LOW (ref >60)
GFR calc non Af Amer: 4 mL/min — ABNORMAL LOW (ref >60)
Glucose, Bld: 275 mg/dL — ABNORMAL HIGH (ref 65–99)
Glucose, Bld: 301 mg/dL — ABNORMAL HIGH (ref 65–99)
Potassium: 5 mmol/L (ref 3.5–5.1)
Potassium: 4.8 mmol/L (ref 3.5–5.1)
Sodium: 132 mmol/L — ABNORMAL LOW (ref 135–145)
Sodium: 130 mmol/L — ABNORMAL LOW (ref 135–145)

## 2015-04-01 LAB — POCT ACTIVATED CLOTTING TIME: Activated Clotting Time: 128 seconds

## 2015-04-01 LAB — POCT I-STAT 3, ART BLOOD GAS (G3+)
Acid-Base Excess: 1 mmol/L (ref 0.0–2.0)
Bicarbonate: 23.6 mEq/L (ref 20.0–24.0)
Bicarbonate: 24.1 mEq/L — ABNORMAL HIGH (ref 20.0–24.0)
Bicarbonate: 25.5 mEq/L — ABNORMAL HIGH (ref 20.0–24.0)
O2 Saturation: 66 %
O2 Saturation: 96 %
O2 Saturation: 96 %
TCO2: 25 mmol/L (ref 0–100)
TCO2: 25 mmol/L (ref 0–100)
TCO2: 27 mmol/L (ref 0–100)
pCO2 arterial: 35.2 mmHg (ref 35.0–45.0)
pCO2 arterial: 35.5 mmHg (ref 35.0–45.0)
pCO2 arterial: 39.8 mmHg (ref 35.0–45.0)
pH, Arterial: 7.415 (ref 7.350–7.450)
pH, Arterial: 7.434 (ref 7.350–7.450)
pH, Arterial: 7.438 (ref 7.350–7.450)
pO2, Arterial: 34 mmHg — CL (ref 80.0–100.0)
pO2, Arterial: 80 mmHg (ref 80.0–100.0)
pO2, Arterial: 81 mmHg (ref 80.0–100.0)

## 2015-04-01 LAB — PROTIME-INR
INR: 1.19 (ref 0.00–1.49)
Prothrombin Time: 15.3 seconds — ABNORMAL HIGH (ref 11.6–15.2)

## 2015-04-01 LAB — LIPID PANEL
Cholesterol: 210 mg/dL — ABNORMAL HIGH (ref 0–200)
HDL: 38 mg/dL — ABNORMAL LOW (ref >40)
LDL Cholesterol: 158 mg/dL — ABNORMAL HIGH (ref 0–99)
Total CHOL/HDL Ratio: 5.5 RATIO
Triglycerides: 68 mg/dL (ref <150)
VLDL: 14 mg/dL (ref 0–40)

## 2015-04-01 LAB — HEPATIC FUNCTION PANEL
ALT: 18 U/L (ref 17–63)
AST: 29 U/L (ref 15–41)
Albumin: 3.2 g/dL — ABNORMAL LOW (ref 3.5–5.0)
Alkaline Phosphatase: 48 U/L (ref 38–126)
Bilirubin, Direct: <0.1 mg/dL — ABNORMAL LOW (ref 0.1–0.5)
Total Bilirubin: 0.6 mg/dL (ref 0.3–1.2)
Total Protein: 6.8 g/dL (ref 6.5–8.1)

## 2015-04-01 LAB — TROPONIN I
Troponin I: 8.51 ng/mL (ref <0.031)
Troponin I: 6.25 ng/mL (ref <0.031)

## 2015-04-01 LAB — CBC
HCT: 26.7 % — ABNORMAL LOW (ref 39.0–52.0)
Hemoglobin: 9 g/dL — ABNORMAL LOW (ref 13.0–17.0)
MCH: 31.4 pg (ref 26.0–34.0)
MCHC: 33.7 g/dL (ref 30.0–36.0)
MCV: 93 fL (ref 78.0–100.0)
Platelets: 206 10*3/uL (ref 150–400)
RBC: 2.87 MIL/uL — ABNORMAL LOW (ref 4.22–5.81)
RDW: 13.6 % (ref 11.5–15.5)
WBC: 6.1 10*3/uL (ref 4.0–10.5)

## 2015-04-01 LAB — HEPARIN LEVEL (UNFRACTIONATED)
Heparin Unfractionated: 0.37 IU/mL (ref 0.30–0.70)
Heparin Unfractionated: 0.44 IU/mL (ref 0.30–0.70)

## 2015-04-01 LAB — GLUCOSE, CAPILLARY
Glucose-Capillary: 281 mg/dL — ABNORMAL HIGH (ref 65–99)
Glucose-Capillary: 222 mg/dL — ABNORMAL HIGH (ref 65–99)
Glucose-Capillary: 284 mg/dL — ABNORMAL HIGH (ref 65–99)
Glucose-Capillary: 271 mg/dL — ABNORMAL HIGH (ref 65–99)
Glucose-Capillary: 239 mg/dL — ABNORMAL HIGH (ref 65–99)

## 2015-04-01 SURGERY — RIGHT/LEFT HEART CATH AND CORONARY ANGIOGRAPHY

## 2015-04-01 MED ORDER — LIDOCAINE-PRILOCAINE 2.5-2.5 % EX CREA: 1.0000 "application " | TOPICAL_CREAM | CUTANEOUS | Status: DC | PRN

## 2015-04-01 MED ORDER — SODIUM CHLORIDE 0.9 % IV SOLN: 100.0000 mL | INTRAVENOUS | Status: DC | PRN

## 2015-04-01 MED ORDER — SODIUM CHLORIDE 0.9 % IV SOLN: 250.0000 mL | INTRAVENOUS | Status: DC | PRN

## 2015-04-01 MED ORDER — FENTANYL CITRATE (PF) 100 MCG/2ML IJ SOLN
INTRAMUSCULAR | Status: DC | PRN
  Administered 2015-04-01: 50 ug via INTRAVENOUS

## 2015-04-01 MED ORDER — IOHEXOL 350 MG/ML SOLN
INTRAVENOUS | Status: DC | PRN
  Administered 2015-04-01: 90 mL via INTRA_ARTERIAL

## 2015-04-01 MED ORDER — PENTAFLUOROPROP-TETRAFLUOROETH EX AERO: 1.0000 "application " | INHALATION_SPRAY | CUTANEOUS | Status: DC | PRN

## 2015-04-01 MED ORDER — MIDAZOLAM HCL 2 MG/2ML IJ SOLN
INTRAMUSCULAR | Status: AC
  Filled 2015-04-01: qty 4

## 2015-04-01 MED ORDER — MIDAZOLAM HCL 2 MG/2ML IJ SOLN
INTRAMUSCULAR | Status: DC | PRN
Start: 2015-04-01 — End: 2015-04-01
  Administered 2015-04-01: 1 mg via INTRAVENOUS

## 2015-04-01 MED ORDER — SODIUM CHLORIDE 0.9 % IJ SOLN: 3.0000 mL | INTRAMUSCULAR | Status: DC | PRN

## 2015-04-01 MED ORDER — HEPARIN (PORCINE) IN NACL 2-0.9 UNIT/ML-% IJ SOLN
INTRAMUSCULAR | Status: DC | PRN
  Administered 2015-04-01: 14:00:00

## 2015-04-01 MED ORDER — LIDOCAINE HCL (PF) 1 % IJ SOLN
INTRAMUSCULAR | Status: AC
  Filled 2015-04-01: qty 30

## 2015-04-01 MED ORDER — HEPARIN (PORCINE) IN NACL 2-0.9 UNIT/ML-% IJ SOLN
INTRAMUSCULAR | Status: AC
  Filled 2015-04-01: qty 1000

## 2015-04-01 MED ORDER — ONDANSETRON HCL 4 MG/2ML IJ SOLN: 4.0000 mg | Freq: Four times a day (QID) | INTRAMUSCULAR | Status: DC | PRN

## 2015-04-01 MED ORDER — ASPIRIN 81 MG PO CHEW
81.0000 mg | CHEWABLE_TABLET | Freq: Once | ORAL | Status: AC
  Administered 2015-04-01: 81 mg via ORAL
  Filled 2015-04-01: qty 1

## 2015-04-01 MED ORDER — LIDOCAINE HCL (PF) 1 % IJ SOLN: 5.0000 mL | INTRAMUSCULAR | Status: DC | PRN

## 2015-04-01 MED ORDER — FENTANYL CITRATE (PF) 100 MCG/2ML IJ SOLN
INTRAMUSCULAR | Status: AC
  Filled 2015-04-01: qty 4

## 2015-04-01 MED ORDER — MORPHINE SULFATE (PF) 2 MG/ML IV SOLN: 2.0000 mg | INTRAVENOUS | Status: DC | PRN

## 2015-04-01 MED ORDER — SODIUM CHLORIDE 0.9 % IJ SOLN
3.0000 mL | Freq: Two times a day (BID) | INTRAMUSCULAR | Status: DC
  Administered 2015-04-01 – 2015-04-02 (×2): 3 mL via INTRAVENOUS

## 2015-04-01 MED ORDER — ALTEPLASE 2 MG IJ SOLR: 2.0000 mg | Freq: Once | INTRAMUSCULAR | Status: DC | PRN

## 2015-04-01 MED ORDER — HEPARIN SODIUM (PORCINE) 1000 UNIT/ML DIALYSIS: 1000.0000 [IU] | INTRAMUSCULAR | Status: DC | PRN

## 2015-04-01 MED ORDER — ACETAMINOPHEN 325 MG PO TABS: 650.0000 mg | ORAL_TABLET | ORAL | Status: DC | PRN

## 2015-04-01 MED ORDER — SODIUM CHLORIDE 0.9 % IJ SOLN
3.0000 mL | Freq: Two times a day (BID) | INTRAMUSCULAR | Status: DC
  Administered 2015-04-01 – 2015-04-02 (×3): 3 mL via INTRAVENOUS

## 2015-04-01 MED ORDER — NITROGLYCERIN 1 MG/10 ML FOR IR/CATH LAB
INTRA_ARTERIAL | Status: AC
  Filled 2015-04-01: qty 10

## 2015-04-01 MED ORDER — HEPARIN (PORCINE) IN NACL 100-0.45 UNIT/ML-% IJ SOLN
1400.0000 [IU]/h | INTRAMUSCULAR | Status: DC
  Administered 2015-04-01: 1200 [IU]/h via INTRAVENOUS
  Filled 2015-04-01 (×2): qty 250

## 2015-04-01 SURGICAL SUPPLY — 9 items
CATH INFINITI 5FR MULTPACK ANG (CATHETERS) ×1 IMPLANT
CATH SWAN GANZ 7F STRAIGHT (CATHETERS) ×1 IMPLANT
KIT HEART LEFT (KITS) ×2 IMPLANT
PACK CARDIAC CATHETERIZATION (CUSTOM PROCEDURE TRAY) ×2 IMPLANT
SHEATH PINNACLE 5F 10CM (SHEATH) ×1 IMPLANT
SHEATH PINNACLE 7F 10CM (SHEATH) ×1 IMPLANT
SYR MEDRAD MARK V 150ML (SYRINGE) ×2 IMPLANT
TRANSDUCER W/STOPCOCK (MISCELLANEOUS) ×2 IMPLANT
WIRE EMERALD 3MM-J .035X150CM (WIRE) ×1 IMPLANT

## 2015-04-01 NOTE — Interval H&P Note (Signed)
History and Physical Interval Note:  04/01/2015 1:10 PM  [name]  has presented today for surgery, with the diagnosis of non stemi.   The various methods of treatment have been discussed with the patient and family. After consideration of risks, benefits and other options for treatment, the patient has consented to  Procedure(s): Right/Left Heart Cath and Coronary Angiography (N/A) as a surgical intervention .  The patient's history has been reviewed, patient examined, no change in status, stable for surgery.  I have reviewed the patient's chart and labs.  Questions were answered to the patient's satisfaction.    Cath Lab Visit (complete for each Cath Lab visit)  Clinical Evaluation Leading to the Procedure:   ACS: Yes.    Non-ACS:    Anginal Classification: CCS IV  Anti-ischemic medical therapy: Maximal Therapy (2 or more classes of medications)  Non-Invasive Test Results: No non-invasive testing performed  Prior CABG: No previous CABG   HARDING, DAVID W  TIMI Score  Patient Information:  TIMI Score is 5  Revascularization of the presumed culprit artery  A (9)  Indication: 11; Score: 9 TIMI Score  Patient Information:  TIMI Score is 5  Revascularization of multiple coronary arteries when the culprit artery cannot clearly be determined  A (9)  Indication: 12; Score: 9   HARDING, Leonie Green, M.D., M.S. Interventional Cardiologist   Pager # [phone]

## 2015-04-01 NOTE — Progress Notes (Signed)
Site area: rt groin fa and fv sheath Site Prior to Removal:  Level 0 Pressure Applied For:  20 minutes  Manual:   yes Patient Status During Pull:  stable Post Pull Site:  Level  0 Post Pull Instructions Given:  yes Post Pull Pulses Present: yes Dressing Applied:  tegaderm Bedrest begins @ [id number] Comments:  0

## 2015-04-01 NOTE — Progress Notes (Signed)
PROGRESS NOTE  Lauren Laduca [id number] DOB: Apr 16, 1951 DOA: 03/30/2015 PCP: Geroge Baseman  Summary:  33 yom with PMH of ESRD on HD, IDDM, CAD s/p PCI, presented with SOB. CXR revealed vascular congestion, and he was found to be volume overload and acute hypoxic respiratory failure.Admitted for emergent HD.found after admission to [facility] to have troponin trending in the 13 range, discussed with cardiology and transferred to [facility] Cardiac cath performed 10/18 revealing multivessel disease and CT surgery consult  Assessment/Plan: 1. Acute hypoxic respiratory failure, SOB secondary to volume overload, poor compliance with diet and missed HD 10/12, possible NSTEMI. Hypoxia resolved.  2. Elevated troponin, suspect NSTEMI troponin in the 13 range -no current chest pain. EKG SR, RBBB, NSCSLT 03/21/2015. cath 10/18 =multivessel severe disease, CT surgery consulted by cardiology 3. ESRD X1 year MWF LUA AVF. Emergent HD upon admission. appreciate input 4. Secondary hyperparathyroidism-continue PhosLo 667OD, Sensipar 30 twice a day 5. anemia renal disease-hemoglobin 9.0.  consider iron studies and saturation ratios as per nephrology 6. hyperlipidemia-T chol-210, LDL 158, aggressive risk factor modification Lipitor 80 daily started this admission 7. Accelerated HTN superimposed essential HTN. Secondary to volume overload. Improved. amlodipine 10, Coreg 25 twice a day, losartan 100 daily, clonidine 0.1 3 times a day (suggest discontinuation clonidine slowly)-cardiology to evaluate 8. Anemia of CKD, stable.  9. 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 10. Murmur.    Code Status: Full DVT prophylaxis: SCDs Family Communication: Wife bedside. Discussed with patient who understands Disposition Plan: pending cardiothor surgery recs-might need CABG  Verneita Griffes, MD [facility] [phone]   04/01/2015, 3:13 PM  LOS: 1 day   Consultants:  Nephrology   Procedures:  HD 10/17  Card Cath 10/18  Antibiotics:    HPI/Subjective:  doing well. Just back from cath. Tolerated diet No other concerns, No chest pain, No nausea no vomiting  Objective: Filed Vitals:   04/01/15 1440 04/01/15 1445 04/01/15 1450 04/01/15 1455  BP: 179/76 173/83 168/80 172/81  Pulse: 87 86 86 85  Temp:      TempSrc:      Resp: 15 16 17 21   Height:      Weight:      SpO2: 99% 100% 98% 99%    Intake/Output Summary (Last 24 hours) at 04/01/15 1513 Last data filed at 03/31/15 1730  Gross per 24 hour  Intake    240 ml  Output      0 ml  Net    240 ml     Filed Weights   03/31/15 0350 03/31/15 2030 04/01/15 0644  Weight: 96.4 kg (212 lb 8.4 oz) 92.307 kg (203 lb 8 oz) 92.307 kg (203 lb 8 oz)    Exam: Afebrile, mildly hypertensive, not hypoxic  General: Lying in bed and appears calm and comfortable Eyes: PERRL, normal lids, irises & conjunctiva ENT: grossly normal hearing, lips & tongue Cardiovascular: Irregular rhythm and regular rate, 2/6 systolic murmur LUSB no r/g. No LE edema.  Respiratory: CTA bilaterally, no w/r/r. Normal respiratory effort. Psychiatric: grossly normal mood and affect, speech fluent and appropriate   Scheduled Meds: . amLODipine  10 mg Oral Daily  . aspirin  325 mg Oral Daily  . atorvastatin  80 mg Oral q1800  . calcium acetate  667 mg Oral Q breakfast  . carvedilol  25 mg Oral BID WC  . cholecalciferol  1,000 Units Oral Daily  .  cinacalcet  30 mg Oral BID WC  . cloNIDine  0.1 mg Oral TID  . Ferric Citrate  1 tablet Oral TID WC  . insulin aspart  0-9 Units Subcutaneous TID WC  . insulin glargine  40 Units Subcutaneous QHS  . labetalol  20 mg Intravenous Once  . losartan  100 mg Oral Daily  . multivitamin with minerals   Oral Daily  . sodium chloride  3 mL Intravenous Q12H  . sodium chloride  3 mL Intravenous Q12H   Continuous  Infusions:   Principal Problem:   NSTEMI (non-ST elevated myocardial infarction) (HCC) Active Problems:   Type 2 diabetes mellitus with diabetic nephropathy (HCC)   Essential hypertension   Acute on chronic renal failure (HCC)   Fluid overload   SOB (shortness of breath)   Volume overload   ESRD on dialysis ([place])   Secondary hypertension, unspecified   Acute diastolic heart failure ([place])   Time spent 35 minutes, >50% in counseling and coordination of care

## 2015-04-01 NOTE — Progress Notes (Signed)
ANTICOAGULATION CONSULT NOTE - Follow Up Consult  Pharmacy Consult for Heparin Indication: ACS  Allergies  Allergen Reactions  . Shellfish Allergy Hives    Patient Measurements: Height: 6\' 5"  (195.6 cm) Weight: 203 lb 8 oz (92.307 kg) IBW/kg (Calculated) : 89.1  Vital Signs: Temp: 98.2 F (36.8 C) (10/18 0644) Temp Source: Oral (10/18 0644) BP: 181/81 mmHg (10/18 1526) Pulse Rate: 89 (10/18 1526)  Labs:  Recent Labs  03/30/15 2333 03/31/15 0353  03/31/15 1831 03/31/15 2353 04/01/15 0140 04/01/15 0711  HGB 10.2* 8.9*  --   --   --  9.0*  --   HCT 30.0* 25.5*  --   --   --  26.7*  --   PLT 233 199  --   --   --  206  --   LABPROT 14.5  --   --   --   --  15.3*  --   INR 1.11  --   --   --   --  1.19  --   HEPARINUNFRC  --   --   --   --  0.44 0.37  --   CREATININE 12.72* 13.68*  --   --   --  10.01* 10.91*  TROPONINI  --   --   < > 14.46*  --  8.51* 6.25*  < > = values in this interval not displayed.  Estimated Creatinine Clearance: 8.7 mL/min (by C-G formula based on Cr of 10.91).   Assessment: 64 yo M presents with chest pain and heparin started. Now status post cath. Pharmacy consulted to restart heparin gtt 8 hrs post sheath removal. Sheath removed at about 1400. Heparin level was therapeutic on 1200 units/hr prior to cath. Last Hgb low at 9, plts wnl. No s/s of bleed  Goal of Therapy:  Heparin level 0.3-0.7 units/ml Monitor platelets by anticoagulation protocol: Yes   Plan:  No heparin BOLUS Restart heparin gtt at 1200 units/hr Check 8 hr HL Monitor daily HL, CBC, s/s of bleed  Jalaiya Oyster J 04/01/2015,3:33 PM

## 2015-04-01 NOTE — Consult Note (Signed)
Renal Service Consult Note [facility]  [name] 04/01/2015 [name] D Requesting Physician:  [name]  Reason for Consult:  ESRD pt with acute MI HPI: The patient is a 64 y.o. year-old with hx of HTN, DM2 and ESRD on HD in [place] presented on Sund night two days ago with SOB, cough, slight chest pain and abd swelling.  He was found to have "fluid in my lungs" and they did emergent HD late Sun night.  Troponins were + and he was transferred here for heart catheterization.  SOB is resolved and CP resolved.    ESRD x 1 year, HD in [place], [place] on MWF basis. Last HD was emergent late sun night or early Mon am. His kidney doctor is in [place] and PCP is Dr [name] in [place].  His father had HTN and his mother had HTN and DM.  She passed from diabetic complications.  He takes in insulin for DM for about 10 years and was on pills for 20 yrs prior to that.  HTN since age 37.  Had a heart attack 15 yrs ago and had heart cath and one stent placed in [place] at [facility].  He has a LUA AVF.     ROS  no f/Dillon/s  no sleeping well  no leg swelling  no prod cough, +whitish phlegm  no rash   no HA  had sweats last night  Past Medical History  Past Medical History  Diagnosis Date  . Hypertension   . Diabetes mellitus without complication ([place])   . Kidney infection   . MI (mitral incompetence)   . CAD (coronary artery disease)     STEMI with LAD stent by [name] in 2005  . Seasonal allergies   . Depression   . History of kidney stones   . Arthritis   . Myocardial infarction [facility])    Past Surgical History  Past Surgical History  Procedure Laterality Date  . Back surgery    . Coronary angioplasty with stent placement  2008  . Spine surgery    . Eye surgery Right     lens implant  . Colonoscopy w/ polypectomy    . Insertion of dialysis catheter Right 01/30/2014    Procedure: INSERTION OF DIALYSIS CATHETER-RIGHT INTERNAL JUGULAR;  Surgeon: Dillon Dutch,  MD;  Location: [place];  Service: Vascular;  Laterality: Right;  . Av fistula placement Left 01/30/2014    Procedure: INSERTION OF ARTERIOVENOUS (AV) GORE-TEX GRAFT LEFT UPPER ARM;  Surgeon: Dillon Dutch, MD;  Location[facility];  Service: Vascular;  Laterality: Left;   Family History  Family History  Problem Relation Age of Onset  . Diabetes Other   . Diabetes Mother   . Hypertension Mother   . Diabetes Father   . Hypertension Father    Social History  reports that he has never smoked. He quit smokeless tobacco use about 4 years ago. His smokeless tobacco use included Chew. He reports that he does not drink alcohol or use illicit drugs. Allergies  Allergies  Allergen Reactions  . Shellfish Allergy Hives   Home medications Prior to Admission medications   Medication Sig Start Date End Date Taking? Authorizing Provider  amLODipine (NORVASC) 10 MG tablet Take 10 mg by mouth daily.  01/13/14  Yes Historical Provider, MD  calcium acetate (PHOSLO) 667 MG capsule Take 667 mg by mouth daily.   Yes Historical Provider, MD  carvedilol (COREG) 25 MG tablet Take 1 tablet (25 mg total) by  mouth 2 (two) times daily with a meal. 03/21/15  Yes Dillon Dike, MD  Cholecalciferol 1000 UNITS tablet Take 1,000 Units by mouth daily.   Yes Historical Provider, MD  cinacalcet (SENSIPAR) 30 MG tablet Take 30 mg by mouth 2 (two) times daily with a meal.   Yes Historical Provider, MD  cloNIDine (CATAPRES) 0.1 MG tablet Take 0.1 mg by mouth 3 (three) times daily.  11/07/13  Yes Historical Provider, MD  Ferric Citrate (AURYXIA) 1 GM 210 MG(FE) TABS Take 1 tablet by mouth 3 (three) times daily with meals.   Yes Historical Provider, MD  insulin glargine (LANTUS) 100 UNIT/ML injection Inject 40 Units into the skin at bedtime.    Yes Historical Provider, MD  losartan (COZAAR) 100 MG tablet Take 1 tablet (100 mg total) by mouth daily. 03/21/15  Yes Dillon Dike, MD  Multiple Vitamin (DAILY VITAMIN PO) Take 1 tablet by  mouth daily.   Yes Historical Provider, MD   Liver Function Tests  Recent Labs Lab 03/31/15 0353  ALBUMIN 3.5   No results for input(s): LIPASE, AMYLASE in the last 168 hours. CBC  Recent Labs Lab 03/30/15 2333 03/31/15 0353 04/01/15 0140  WBC 12.8* 11.3* 6.1  NEUTROABS 10.6*  --   --   HGB 10.2* 8.9* 9.0*  HCT 30.0* 25.5* 26.7*  MCV 94.9 94.1 93.0  PLT 233 199 [id number]   Basic Metabolic Panel  Recent Labs Lab 03/30/15 2333 03/31/15 0353 04/01/15 0140  NA 139 135 132*  K 4.0 4.2 5.0  CL 97* 97* 91*  CO2 26 24 28   GLUCOSE 166* 189* 275*  BUN 81* 87* 55*  CREATININE 12.72* 13.68* 10.01*  CALCIUM 9.2 8.9 8.8*  PHOS  --  6.3*  --     Filed Vitals:   03/31/15 1000 03/31/15 1539 03/31/15 2030 04/01/15 0644  BP: 170/82 155/75 147/69 153/67  Pulse: 88 81 79 73  Temp: 98.6 F (37 Dillon) 98.5 F (36.9 Dillon) 98.8 F (37.1 Dillon) 98.2 F (36.8 Dillon)  TempSrc: Oral Oral Oral Oral  Resp:  18 18 18   Height:   6\' 5"  (1.956 m)   Weight:   92.307 kg (203 lb 8 oz) 92.307 kg (203 lb 8 oz)  SpO2: 94% 95% 97% 95%   Exam Alert, no distress, calm No rash, cyanosis or gangrene Sclera anicteric, throat clear No jvd or bruits Chest clear bilat to bases RRR no mrg Abd soft ntnd no mass or ascites GU normal male LE's no edema , ulcers or wounds LUA AVF +bruit, no lesions or aneurysms Neuro alert, nf ox 3    HD MWF [place] on [address]  Assessment: 1 Acute NSTEMI - for heart cath per cardiology 2 ESRD HD mwf in [place] 3 HTN longstanding, on norvasc/ coreg/ clon/ losartan 4 Vol looks ok today 5 DM2 on insulin 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

## 2015-04-01 NOTE — Progress Notes (Signed)
[place] for Heparin Indication: chest pain/ACS  Allergies  Allergen Reactions  . Shellfish Allergy Hives    Patient Measurements: Height: 6\' 5"  (195.6 cm) Weight: 203 lb 8 oz (92.307 kg) IBW/kg (Calculated) : 89.1 Heparin Dosing Weight: 96.4  Vital Signs: Temp: 98.8 F (37.1 C) (10/17 2030) Temp Source: Oral (10/17 2030) BP: 147/69 mmHg (10/17 2030) Pulse Rate: 79 (10/17 2030)  Labs:  Recent Labs  03/30/15 2333 03/31/15 0353 03/31/15 1312 03/31/15 1831 03/31/15 2353  HGB 10.2* 8.9*  --   --   --   HCT 30.0* 25.5*  --   --   --   PLT 233 199  --   --   --   LABPROT 14.5  --   --   --   --   INR 1.11  --   --   --   --   HEPARINUNFRC  --   --   --   --  0.44  CREATININE 12.72* 13.68*  --   --   --   TROPONINI  --   --  13.87* 14.46*  --     Estimated Creatinine Clearance: 7 mL/min (by C-G formula based on Cr of 13.68).   Medical History: Past Medical History  Diagnosis Date  . Hypertension   . Diabetes mellitus without complication ([place])   . Kidney infection   . MI (mitral incompetence)   . CAD (coronary artery disease)     STEMI with LAD stent by C Granger in 2005  . Seasonal allergies   . Depression   . History of kidney stones   . Arthritis   . Myocardial infarction [facility])     Medications:  Prescriptions prior to admission  Medication Sig Dispense Refill Last Dose  . amLODipine (NORVASC) 10 MG tablet Take 10 mg by mouth daily.    03/30/2015 at Unknown time  . calcium acetate (PHOSLO) 667 MG capsule Take 667 mg by mouth daily.   03/30/2015 at Unknown time  . carvedilol (COREG) 25 MG tablet Take 1 tablet (25 mg total) by mouth 2 (two) times daily with a meal.   03/30/2015 at 2130  . Cholecalciferol 1000 UNITS tablet Take 1,000 Units by mouth daily.   03/30/2015 at Unknown time  . cinacalcet (SENSIPAR) 30 MG tablet Take 30 mg by mouth 2 (two) times daily with a meal.   03/30/2015 at Unknown time  . cloNIDine  (CATAPRES) 0.1 MG tablet Take 0.1 mg by mouth 3 (three) times daily.    03/30/2015 at Unknown time  . Ferric Citrate (AURYXIA) 1 GM 210 MG(FE) TABS Take 1 tablet by mouth 3 (three) times daily with meals.   03/30/2015 at Unknown time  . insulin glargine (LANTUS) 100 UNIT/ML injection Inject 40 Units into the skin at bedtime.    03/29/2015 at Unknown time  . losartan (COZAAR) 100 MG tablet Take 1 tablet (100 mg total) by mouth daily.   03/30/2015 at Unknown time  . Multiple Vitamin (DAILY VITAMIN PO) Take 1 tablet by mouth daily.   03/30/2015 at Unknown time    Assessment: 64 y.o. male with chest pain for heparin   Goal of Therapy:  Heparin level 0.3-0.7 units/ml Monitor platelets by anticoagulation protocol: Yes   Plan:  Continue Heparin at current rate Follow-up am labs.  Phillis Knack, PharmD, BCPS   04/01/2015,12:53 AM

## 2015-04-01 NOTE — Progress Notes (Signed)
Inpatient Diabetes Program Recommendations  AACE/ADA: New Consensus Statement on Inpatient Glycemic Control (2015)  Target Ranges:  Prepandial:   less than 140 mg/dL      Peak postprandial:   less than 180 mg/dL (1-2 hours)      Critically ill patients:  140 - 180 mg/dL  Results for TRAJEN, SOWLE (MRN [id number]) as of 04/01/2015 14:29  Ref. Range 03/31/2015 20:42 04/01/2015 07:52 04/01/2015 11:11  Glucose-Capillary Latest Ref Range: 65-99 mg/dL 262 (H) 281 (H) 222 (H)   Review of Glycemic Control  Current orders for Inpatient glycemic control: Lantus 40 units QHS, Novolog 0-9 units TID with meals  Inpatient Diabetes Program Recommendations: Insulin - Basal: In reviewing the chart, noted patient only received Lantus 20 units last night because patient refused to take Lantus 40 units as ordered. As a result of only taking half dose of Lantus, fasting glucose is 275 mg/dl and glucose has continued to be 222 mg/dl or greater. Do not recommend changing Lantus dose at this time.  Thanks, Barnie Alderman, RN, MSN, CCRN, CDE Diabetes Coordinator Inpatient Diabetes Program [phone] (Team Pager from [place] to [place]) [phone] ([facility] office) [phone] [facility] office) [phone] [facility] office)

## 2015-04-01 NOTE — Progress Notes (Signed)
Patient Name: [name] Date of Encounter: 04/01/2015  Principal Problem:   NSTEMI (non-ST elevated myocardial infarction) [facility]) Active Problems:   Type 2 diabetes mellitus with diabetic nephropathy (HCC)   Essential hypertension   Acute on chronic renal failure (HCC)   Fluid overload   SOB (shortness of breath)   Volume overload   ESRD on dialysis [facility])    Primary Cardiologist: New - Dr. Johnsie Cancel Patient Profile: 64 y.o.male w/ PMH of CAD (s/p PCI to LAD in 2005),  ESRD (on dialysis), Type 2 DM, and HTN admitted on 03/31/2015 for shortness of breath and found to have elevated creatinine of 13.68 and troponin of 13.87.  SUBJECTIVE: Denies any chest pain or palpitations overnight. Reports his breathing is at baseline. Having a dry cough. NPO since midnight for cath today.  OBJECTIVE Filed Vitals:   03/31/15 1000 03/31/15 1539 03/31/15 2030 04/01/15 0644  BP: 170/82 155/75 147/69 153/67  Pulse: 88 81 79 73  Temp: 98.6 F (37 C) 98.5 F (36.9 C) 98.8 F (37.1 C) 98.2 F (36.8 C)  TempSrc: Oral Oral Oral Oral  Resp:  18 18 18   Height:   6\' 5"  (1.956 m)   Weight:   203 lb 8 oz (92.307 kg) 203 lb 8 oz (92.307 kg)  SpO2: 94% 95% 97% 95%    Intake/Output Summary (Last 24 hours) at 04/01/15 0752 Last data filed at 03/31/15 1730  Gross per 24 hour  Intake    360 ml  Output   3625 ml  Net  -3265 ml   Filed Weights   03/31/15 0350 03/31/15 2030 04/01/15 0644  Weight: 212 lb 8.4 oz (96.4 kg) 203 lb 8 oz (92.307 kg) 203 lb 8 oz (92.307 kg)    PHYSICAL EXAM General: Well developed, well nourished, male in no acute distress. Head: Normocephalic, atraumatic.  Neck: Supple without bruits, JVD not elevated. Lungs:  Resp regular and unlabored, CTA without wheezing or rales. Heart: RRR, S1, S2, no S3, S4, 2/6 SEM; no rub. Abdomen: Soft, non-tender, non-distended with normoactive bowel sounds. No hepatomegaly. No rebound/guarding. No obvious abdominal  masses. Extremities: No clubbing, cyanosis, or edema. Distal pedal pulses are 2+ bilaterally. Neuro: Alert and oriented X 3. Moves all extremities spontaneously. Psych: Normal affect.  LABS: CBC: Recent Labs  03/30/15 2333 03/31/15 0353 04/01/15 0140  WBC 12.8* 11.3* 6.1  NEUTROABS 10.6*  --   --   HGB 10.2* 8.9* 9.0*  HCT 30.0* 25.5* 26.7*  MCV 94.9 94.1 93.0  PLT 233 199 206   INR: Recent Labs  04/01/15 0140  INR 0000000   Basic Metabolic Panel: Recent Labs  03/31/15 0353 04/01/15 0140  NA 135 132*  K 4.2 5.0  CL 97* 91*  CO2 24 28  GLUCOSE 189* 275*  BUN 87* 55*  CREATININE 13.68* 10.01*  CALCIUM 8.9 8.8*  PHOS 6.3*  --    Liver Function Tests: Recent Labs  03/31/15 0353  ALBUMIN 3.5   Cardiac Enzymes: Recent Labs  03/31/15 1312 03/31/15 1831 04/01/15 0140  TROPONINI 13.87* 14.46* 8.51*   BNP:  B NATRIURETIC PEPTIDE  Date/Time Value Ref Range Status  03/21/2015 12:56 AM 569.0* 0.0 - 100.0 pg/mL Final   Fasting Lipid Panel: Recent Labs  04/01/15 0140  CHOL 210*  HDL 38*  LDLCALC 158*  TRIG 68  CHOLHDL 5.5   TELE: NSR with rate in 70's. No atopic events.       ECG: NSR. Rate in 80's. Chronic  RBBB noted.  ECHO: Pending  Radiology/Studies: Dg Chest 2 View: 03/31/2015  CLINICAL DATA:  Acute onset of shortness of breath. Initial encounter. EXAM: CHEST  2 VIEW COMPARISON:  Chest radiograph performed 03/21/2015 FINDINGS: The lungs are well-aerated. Vascular congestion is noted, with bilateral central airspace opacities, concerning for mild pulmonary edema. There is no evidence of pleural effusion or pneumothorax. The heart is normal in size; the mediastinal contour is within normal limits. No acute osseous abnormalities are seen. IMPRESSION: Vascular congestion, with bilateral central airspace opacities, concerning for mild pulmonary edema. Electronically Signed   By: Garald Balding M.D.   On: 03/31/2015 00:25     Current Medications:  .  amLODipine  10 mg Oral Daily  . aspirin  81 mg Oral Once  . aspirin  325 mg Oral Daily  . atorvastatin  80 mg Oral q1800  . calcium acetate  667 mg Oral Q breakfast  . carvedilol  25 mg Oral BID WC  . cholecalciferol  1,000 Units Oral Daily  . cinacalcet  30 mg Oral BID WC  . cloNIDine  0.1 mg Oral TID  . Ferric Citrate  1 tablet Oral TID WC  . insulin aspart  0-9 Units Subcutaneous TID WC  . insulin glargine  40 Units Subcutaneous QHS  . labetalol  20 mg Intravenous Once  . losartan  100 mg Oral Daily  . multivitamin with minerals   Oral Daily  . sodium chloride  3 mL Intravenous Q12H   . sodium chloride 10 mL/hr at 04/01/15 0555  . heparin 1,200 Units/hr (03/31/15 1810)    ASSESSMENT AND PLAN:  1. NSTEMI (non-ST elevated myocardial infarction) ([place]) - s/p PCI to LAD in 2005. Followed by [facility] [place] but not since 2013. - initial troponin elevated to 13.87. Cyclic values have been 14.46, 8.51 and 6.25. Denies any chest pain at this time. - continue ASA, statin, BB, ARB, and Heparin. - for RHC and LHC today at 1030 with Dr. Ellyn Hack  2. ESRD - missed dialysis all last week - having dialysis while inpatient. Next session on Wednesday.  3. Type 2 diabetes mellitus with diabetic nephropathy ([place]) - per admitting team  4. Essential hypertension - BP has been 147/67 - 170/82 while admitted. - continue CCB, ARB, clonidine, and BB.   Arna Medici , PA-C 7:52 AM 04/01/2015 Pager: [phone] Agree with assessment and plan as above. This am patient denies  any dyspnea or chest discomfort. Lungs are clear. Heart reveals grade 2/6 systolic ejection murmur at left sternal edge. Continuous murmur audible under left clavicle reflecting his left arm AV fistula. Plan:RHC and LHC later today.

## 2015-04-01 NOTE — H&P (View-Only) (Signed)
Patient Name: [name] Date of Encounter: 04/01/2015  Principal Problem:   NSTEMI (non-ST elevated myocardial infarction) [facility]) Active Problems:   Type 2 diabetes mellitus with diabetic nephropathy (HCC)   Essential hypertension   Acute on chronic renal failure (HCC)   Fluid overload   SOB (shortness of breath)   Volume overload   ESRD on dialysis [facility])    Primary Cardiologist: New - Dr. Johnsie Cancel Patient Profile: 64 y.o.male w/ PMH of CAD (s/p PCI to LAD in 2005),  ESRD (on dialysis), Type 2 DM, and HTN admitted on 03/31/2015 for shortness of breath and found to have elevated creatinine of 13.68 and troponin of 13.87.  SUBJECTIVE: Denies any chest pain or palpitations overnight. Reports his breathing is at baseline. Having a dry cough. NPO since midnight for cath today.  OBJECTIVE Filed Vitals:   03/31/15 1000 03/31/15 1539 03/31/15 2030 04/01/15 0644  BP: 170/82 155/75 147/69 153/67  Pulse: 88 81 79 73  Temp: 98.6 F (37 C) 98.5 F (36.9 C) 98.8 F (37.1 C) 98.2 F (36.8 C)  TempSrc: Oral Oral Oral Oral  Resp:  18 18 18   Height:   6\' 5"  (1.956 m)   Weight:   203 lb 8 oz (92.307 kg) 203 lb 8 oz (92.307 kg)  SpO2: 94% 95% 97% 95%    Intake/Output Summary (Last 24 hours) at 04/01/15 0752 Last data filed at 03/31/15 1730  Gross per 24 hour  Intake    360 ml  Output   3625 ml  Net  -3265 ml   Filed Weights   03/31/15 0350 03/31/15 2030 04/01/15 0644  Weight: 212 lb 8.4 oz (96.4 kg) 203 lb 8 oz (92.307 kg) 203 lb 8 oz (92.307 kg)    PHYSICAL EXAM General: Well developed, well nourished, male in no acute distress. Head: Normocephalic, atraumatic.  Neck: Supple without bruits, JVD not elevated. Lungs:  Resp regular and unlabored, CTA without wheezing or rales. Heart: RRR, S1, S2, no S3, S4, 2/6 SEM; no rub. Abdomen: Soft, non-tender, non-distended with normoactive bowel sounds. No hepatomegaly. No rebound/guarding. No obvious abdominal  masses. Extremities: No clubbing, cyanosis, or edema. Distal pedal pulses are 2+ bilaterally. Neuro: Alert and oriented X 3. Moves all extremities spontaneously. Psych: Normal affect.  LABS: CBC: Recent Labs  03/30/15 2333 03/31/15 0353 04/01/15 0140  WBC 12.8* 11.3* 6.1  NEUTROABS 10.6*  --   --   HGB 10.2* 8.9* 9.0*  HCT 30.0* 25.5* 26.7*  MCV 94.9 94.1 93.0  PLT 233 199 206   INR: Recent Labs  04/01/15 0140  INR 0000000   Basic Metabolic Panel: Recent Labs  03/31/15 0353 04/01/15 0140  NA 135 132*  K 4.2 5.0  CL 97* 91*  CO2 24 28  GLUCOSE 189* 275*  BUN 87* 55*  CREATININE 13.68* 10.01*  CALCIUM 8.9 8.8*  PHOS 6.3*  --    Liver Function Tests: Recent Labs  03/31/15 0353  ALBUMIN 3.5   Cardiac Enzymes: Recent Labs  03/31/15 1312 03/31/15 1831 04/01/15 0140  TROPONINI 13.87* 14.46* 8.51*   BNP:  B NATRIURETIC PEPTIDE  Date/Time Value Ref Range Status  03/21/2015 12:56 AM 569.0* 0.0 - 100.0 pg/mL Final   Fasting Lipid Panel: Recent Labs  04/01/15 0140  CHOL 210*  HDL 38*  LDLCALC 158*  TRIG 68  CHOLHDL 5.5   TELE: NSR with rate in 70's. No atopic events.       ECG: NSR. Rate in 80's. Chronic  RBBB noted.  ECHO: Pending  Radiology/Studies: Dg Chest 2 View: 03/31/2015  CLINICAL DATA:  Acute onset of shortness of breath. Initial encounter. EXAM: CHEST  2 VIEW COMPARISON:  Chest radiograph performed 03/21/2015 FINDINGS: The lungs are well-aerated. Vascular congestion is noted, with bilateral central airspace opacities, concerning for mild pulmonary edema. There is no evidence of pleural effusion or pneumothorax. The heart is normal in size; the mediastinal contour is within normal limits. No acute osseous abnormalities are seen. IMPRESSION: Vascular congestion, with bilateral central airspace opacities, concerning for mild pulmonary edema. Electronically Signed   By: Garald Balding M.D.   On: 03/31/2015 00:25     Current Medications:  .  amLODipine  10 mg Oral Daily  . aspirin  81 mg Oral Once  . aspirin  325 mg Oral Daily  . atorvastatin  80 mg Oral q1800  . calcium acetate  667 mg Oral Q breakfast  . carvedilol  25 mg Oral BID WC  . cholecalciferol  1,000 Units Oral Daily  . cinacalcet  30 mg Oral BID WC  . cloNIDine  0.1 mg Oral TID  . Ferric Citrate  1 tablet Oral TID WC  . insulin aspart  0-9 Units Subcutaneous TID WC  . insulin glargine  40 Units Subcutaneous QHS  . labetalol  20 mg Intravenous Once  . losartan  100 mg Oral Daily  . multivitamin with minerals   Oral Daily  . sodium chloride  3 mL Intravenous Q12H   . sodium chloride 10 mL/hr at 04/01/15 0555  . heparin 1,200 Units/hr (03/31/15 1810)    ASSESSMENT AND PLAN:  1. NSTEMI (non-ST elevated myocardial infarction) ([place]) - s/p PCI to LAD in 2005. Followed by [facility] but not since 2013. - initial troponin elevated to 13.87. Cyclic values have been 14.46, 8.51 and 6.25. Denies any chest pain at this time. - continue ASA, statin, BB, ARB, and Heparin. - for RHC and LHC today at 1030 with Dr. Ellyn Hack  2. ESRD - missed dialysis all last week - having dialysis while inpatient. Next session on Wednesday.  3. Type 2 diabetes mellitus with diabetic nephropathy ([place]) - per admitting team  4. Essential hypertension - BP has been 147/67 - 170/82 while admitted. - continue CCB, ARB, clonidine, and BB.   Arna Medici , PA-C 7:52 AM 04/01/2015 Pager: [phone] Agree with assessment and plan as above. This am patient denies  any dyspnea or chest discomfort. Lungs are clear. Heart reveals grade 2/6 systolic ejection murmur at left sternal edge. Continuous murmur audible under left clavicle reflecting his left arm AV fistula. Plan:RHC and LHC later today.

## 2015-04-02 ENCOUNTER — Inpatient Hospital Stay (HOSPITAL_COMMUNITY): Payer: Medicare Other

## 2015-04-02 ENCOUNTER — Other Ambulatory Visit: Payer: Self-pay | Admitting: *Deleted

## 2015-04-02 ENCOUNTER — Encounter (HOSPITAL_COMMUNITY): Payer: Self-pay | Admitting: Cardiology

## 2015-04-02 DIAGNOSIS — I509 Heart failure, unspecified: Secondary | ICD-10-CM

## 2015-04-02 DIAGNOSIS — I2510 Atherosclerotic heart disease of native coronary artery without angina pectoris: Secondary | ICD-10-CM

## 2015-04-02 DIAGNOSIS — Z01810 Encounter for preprocedural cardiovascular examination: Secondary | ICD-10-CM

## 2015-04-02 DIAGNOSIS — I25110 Atherosclerotic heart disease of native coronary artery with unstable angina pectoris: Secondary | ICD-10-CM

## 2015-04-02 LAB — RENAL FUNCTION PANEL
Albumin: 3.1 g/dL — ABNORMAL LOW (ref 3.5–5.0)
Anion gap: 19 — ABNORMAL HIGH (ref 5–15)
BUN: 90 mg/dL — ABNORMAL HIGH (ref 6–20)
CO2: 27 mmol/L (ref 22–32)
Calcium: 9.3 mg/dL (ref 8.9–10.3)
Chloride: 89 mmol/L — ABNORMAL LOW (ref 101–111)
Creatinine, Ser: 13.03 mg/dL — ABNORMAL HIGH (ref 0.61–1.24)
GFR calc Af Amer: 4 mL/min — ABNORMAL LOW (ref >60)
GFR calc non Af Amer: 4 mL/min — ABNORMAL LOW (ref >60)
Glucose, Bld: 125 mg/dL — ABNORMAL HIGH (ref 65–99)
Phosphorus: 6.4 mg/dL — ABNORMAL HIGH (ref 2.5–4.6)
Potassium: 4.2 mmol/L (ref 3.5–5.1)
Sodium: 135 mmol/L (ref 135–145)

## 2015-04-02 LAB — HEPARIN LEVEL (UNFRACTIONATED)
Heparin Unfractionated: 0.25 IU/mL — ABNORMAL LOW (ref 0.30–0.70)
Heparin Unfractionated: 0.33 IU/mL (ref 0.30–0.70)

## 2015-04-02 LAB — CBC WITH DIFFERENTIAL/PLATELET
Basophils Absolute: 0 10*3/uL (ref 0.0–0.1)
Basophils Relative: 0 %
Eosinophils Absolute: 0.1 10*3/uL (ref 0.0–0.7)
Eosinophils Relative: 1 %
HCT: 25.3 % — ABNORMAL LOW (ref 39.0–52.0)
Hemoglobin: 8.8 g/dL — ABNORMAL LOW (ref 13.0–17.0)
Lymphocytes Relative: 23 %
Lymphs Abs: 2 10*3/uL (ref 0.7–4.0)
MCH: 32.1 pg (ref 26.0–34.0)
MCHC: 34.8 g/dL (ref 30.0–36.0)
MCV: 92.3 fL (ref 78.0–100.0)
Monocytes Absolute: 0.5 10*3/uL (ref 0.1–1.0)
Monocytes Relative: 6 %
Neutro Abs: 5.8 10*3/uL (ref 1.7–7.7)
Neutrophils Relative %: 70 %
Platelets: 215 10*3/uL (ref 150–400)
RBC: 2.74 MIL/uL — ABNORMAL LOW (ref 4.22–5.81)
RDW: 13.7 % (ref 11.5–15.5)
WBC: 8.4 10*3/uL (ref 4.0–10.5)

## 2015-04-02 LAB — CBC
HCT: 23.8 % — ABNORMAL LOW (ref 39.0–52.0)
Hemoglobin: 8.3 g/dL — ABNORMAL LOW (ref 13.0–17.0)
MCH: 31.8 pg (ref 26.0–34.0)
MCHC: 34.9 g/dL (ref 30.0–36.0)
MCV: 91.2 fL (ref 78.0–100.0)
Platelets: 221 10*3/uL (ref 150–400)
RBC: 2.61 MIL/uL — ABNORMAL LOW (ref 4.22–5.81)
RDW: 13.6 % (ref 11.5–15.5)
WBC: 8.1 10*3/uL (ref 4.0–10.5)

## 2015-04-02 LAB — PREPARE RBC (CROSSMATCH)

## 2015-04-02 LAB — PULMONARY FUNCTION TEST
FEF 25-75 Pre: 2.23 L/sec
FEF2575-%Pred-Pre: 64 %
FEV1-%Pred-Pre: 74 %
FEV1-Pre: 2.91 L
FEV1FVC-%Pred-Pre: 97 %
FEV6-%Pred-Pre: 77 %
FEV6-Pre: 3.76 L
FEV6FVC-%Pred-Pre: 102 %
FVC-%Pred-Pre: 75 %
FVC-Pre: 3.84 L
Pre FEV1/FVC ratio: 76 %
Pre FEV6/FVC Ratio: 98 %
RV % pred: 223 %
RV: 6.04 L
TLC % pred: 117 %
TLC: 9.87 L

## 2015-04-02 LAB — ABO/RH: ABO/RH(D): B POS

## 2015-04-02 LAB — GLUCOSE, CAPILLARY
Glucose-Capillary: 195 mg/dL — ABNORMAL HIGH (ref 65–99)
Glucose-Capillary: 141 mg/dL — ABNORMAL HIGH (ref 65–99)
Glucose-Capillary: 214 mg/dL — ABNORMAL HIGH (ref 65–99)

## 2015-04-02 MED ORDER — DEXMEDETOMIDINE HCL IN NACL 400 MCG/100ML IV SOLN
0.1000 ug/kg/h | INTRAVENOUS | Status: DC
  Administered 2015-04-03: .3 ug/kg/h via INTRAVENOUS
  Filled 2015-04-02: qty 100

## 2015-04-02 MED ORDER — NITROGLYCERIN IN D5W 200-5 MCG/ML-% IV SOLN
2.0000 ug/min | INTRAVENOUS | Status: DC
  Administered 2015-04-03: 5 ug/min via INTRAVENOUS
  Filled 2015-04-02: qty 250

## 2015-04-02 MED ORDER — VANCOMYCIN HCL 10 G IV SOLR
1500.0000 mg | INTRAVENOUS | Status: AC
  Administered 2015-04-03: 1500 mg via INTRAVENOUS
  Filled 2015-04-02: qty 1500

## 2015-04-02 MED ORDER — PLASMA-LYTE 148 IV SOLN
INTRAVENOUS | Status: DC
  Filled 2015-04-02: qty 2.5

## 2015-04-02 MED ORDER — INSULIN ASPART 100 UNIT/ML ~~LOC~~ SOLN
4.0000 [IU] | Freq: Three times a day (TID) | SUBCUTANEOUS | Status: DC
  Administered 2015-04-02 (×2): 4 [IU] via SUBCUTANEOUS

## 2015-04-02 MED ORDER — CHLORHEXIDINE GLUCONATE 4 % EX LIQD
60.0000 mL | Freq: Once | CUTANEOUS | Status: AC
  Administered 2015-04-03: 4 via TOPICAL
  Filled 2015-04-02 (×2): qty 60

## 2015-04-02 MED ORDER — DEXTROSE 5 % IV SOLN
750.0000 mg | INTRAVENOUS | Status: DC
  Filled 2015-04-02: qty 750

## 2015-04-02 MED ORDER — BISACODYL 5 MG PO TBEC
5.0000 mg | DELAYED_RELEASE_TABLET | Freq: Once | ORAL | Status: AC
  Administered 2015-04-02: 5 mg via ORAL
  Filled 2015-04-02 (×2): qty 1

## 2015-04-02 MED ORDER — DIAZEPAM 5 MG PO TABS
5.0000 mg | ORAL_TABLET | Freq: Once | ORAL | Status: AC
  Administered 2015-04-03: 5 mg via ORAL
  Filled 2015-04-02: qty 1

## 2015-04-02 MED ORDER — CHLORHEXIDINE GLUCONATE 4 % EX LIQD
60.0000 mL | Freq: Once | CUTANEOUS | Status: AC
  Administered 2015-04-02: 4 via TOPICAL
  Filled 2015-04-02 (×2): qty 60

## 2015-04-02 MED ORDER — CHLORHEXIDINE GLUCONATE 0.12 % MT SOLN
15.0000 mL | Freq: Once | OROMUCOSAL | Status: AC
  Administered 2015-04-03: 15 mL via OROMUCOSAL
  Filled 2015-04-02 (×2): qty 15

## 2015-04-02 MED ORDER — TEMAZEPAM 15 MG PO CAPS: 15.0000 mg | ORAL_CAPSULE | Freq: Once | ORAL | Status: DC | PRN

## 2015-04-02 MED ORDER — INSULIN GLARGINE 100 UNIT/ML ~~LOC~~ SOLN
30.0000 [IU] | Freq: Two times a day (BID) | SUBCUTANEOUS | Status: DC
  Filled 2015-04-02 (×2): qty 0.3

## 2015-04-02 MED ORDER — MAGNESIUM SULFATE 50 % IJ SOLN
40.0000 meq | INTRAMUSCULAR | Status: DC
  Filled 2015-04-02: qty 10

## 2015-04-02 MED ORDER — INSULIN GLARGINE 100 UNIT/ML ~~LOC~~ SOLN
20.0000 [IU] | Freq: Every day | SUBCUTANEOUS | Status: DC
  Administered 2015-04-02: 20 [IU] via SUBCUTANEOUS
  Filled 2015-04-02: qty 0.2

## 2015-04-02 MED ORDER — DEXTROSE 5 % IV SOLN
1.5000 g | INTRAVENOUS | Status: DC
  Administered 2015-04-03: 1.5 g via INTRAVENOUS
  Administered 2015-04-03: 750 g via INTRAVENOUS
  Filled 2015-04-02: qty 1.5

## 2015-04-02 MED ORDER — DOPAMINE-DEXTROSE 3.2-5 MG/ML-% IV SOLN
0.0000 ug/kg/min | INTRAVENOUS | Status: DC
  Filled 2015-04-02: qty 250

## 2015-04-02 MED ORDER — SODIUM CHLORIDE 0.9 % IV SOLN
INTRAVENOUS | Status: DC
  Filled 2015-04-02: qty 30

## 2015-04-02 MED ORDER — ALPRAZOLAM 0.25 MG PO TABS: 0.2500 mg | ORAL_TABLET | ORAL | Status: DC | PRN

## 2015-04-02 MED ORDER — INSULIN ASPART 100 UNIT/ML ~~LOC~~ SOLN: 4.0000 [IU] | Freq: Three times a day (TID) | SUBCUTANEOUS | Status: DC

## 2015-04-02 MED ORDER — PHENYLEPHRINE HCL 10 MG/ML IJ SOLN
30.0000 ug/min | INTRAVENOUS | Status: DC
  Filled 2015-04-02: qty 2

## 2015-04-02 MED ORDER — METOPROLOL TARTRATE 12.5 MG HALF TABLET
12.5000 mg | ORAL_TABLET | Freq: Once | ORAL | Status: AC
  Administered 2015-04-03: 12.5 mg via ORAL
  Filled 2015-04-02 (×2): qty 1

## 2015-04-02 MED ORDER — SODIUM CHLORIDE 0.9 % IV SOLN
INTRAVENOUS | Status: DC
  Administered 2015-04-03: 1 [IU]/h via INTRAVENOUS
  Filled 2015-04-02: qty 2.5

## 2015-04-02 MED ORDER — INSULIN ASPART 100 UNIT/ML ~~LOC~~ SOLN
0.0000 [IU] | Freq: Three times a day (TID) | SUBCUTANEOUS | Status: DC
  Administered 2015-04-02: 1 [IU] via SUBCUTANEOUS

## 2015-04-02 MED ORDER — POTASSIUM CHLORIDE 2 MEQ/ML IV SOLN
80.0000 meq | INTRAVENOUS | Status: DC
  Filled 2015-04-02: qty 40

## 2015-04-02 MED ORDER — AMINOCAPROIC ACID 250 MG/ML IV SOLN
INTRAVENOUS | Status: DC
  Administered 2015-04-03: 69.8 mL/h via INTRAVENOUS
  Filled 2015-04-02: qty 40

## 2015-04-02 MED ORDER — EPINEPHRINE HCL 1 MG/ML IJ SOLN
0.0000 ug/min | INTRAMUSCULAR | Status: DC
  Filled 2015-04-02: qty 4

## 2015-04-02 NOTE — Progress Notes (Signed)
Pt just back from HD and resting. Encouraged him to get up later and move around room. Discussed OHS, sternal precautions, IS, mobility and d/c plan. Voiced understanding. 2 other family members present. Pt able to inspire IS to 2500 mL with appropriate mechanics. Sts he is sometimes wobbly upon standing but does not use assistive device. Wife can be with him at d/c. To read booklet and watch video this evening with wife.  [place], ACSM 1:39 PM 04/02/2015

## 2015-04-02 NOTE — Consult Note (Signed)
[address]       [place] [address]             [phone]        Gabino Eskin [place] Medical Record [id number] Date of Birth: 08-06-1950  Referring: No ref. provider found Primary Care: Chip Boer, PA-C  Chief Complaint:    Chief Complaint  Patient presents with  . Shortness of Breath  patient examined cardiac catheterization , right heart catheterization and chest x-ray images personally reviewed. Echocardiogram is pending.  History of Present Illness:       64 year old  AA male on chronic hemodialysis for one year presented to the ED with acute onset of shortness of breath and was found to be in poor edema with positive cardiac enzymes -gastric 113.9the patient has type 2 diabetes, no history of smoking, history of LAD PCI 10 years ago, and type 2 diabetes.  The patient underwent urgent hemodialysis which improved symptoms of shortness of breath and pulmonary edema. Late yesterday the patient underwent cardiac catheterization which demonstrated 90% proximal LAD stenosis, severe three-vessel coronary disease with preserved LV systolic function. Right heart pressures were fairly normal and LVEDP was 14. No evidence of mitral regurgitation on ventriculogram.because of the patient's coronary anatomy and three-vessel disease he is felt to be a potential candidate for CABG.   Current Activity/ Functional Status: Patient is able to participate in hemodialysis 3 days a week  his vision is good --he denies significant arthritis or difficulty with ambulation  Zubrod Score: At the time of surgery this patient's most appropriate activity status/level should be described as: []     0    Normal activity, no symptoms []     1    Restricted in physical strenuous activity but ambulatory, able to do out light work [x]     2    Ambulatory and capable of self care, unable to do work activities, up and about                 more than 50%  Of the time                             []     3    Only limited self care, in bed greater than 50% of waking hours []     4    Completely disabled, no self care, confined to bed or chair []     5    Moribund  Past Medical History  Diagnosis Date  . Hypertension   . Diabetes mellitus without complication ([place])   . Kidney infection   . MI (mitral incompetence)   . CAD (coronary artery disease)     STEMI with LAD stent by [facility] in 2005  . Seasonal allergies   . Depression   . History of kidney stones   . Arthritis   . Myocardial infarction [facility])     Past Surgical History  Procedure Laterality Date  . Back surgery    . Coronary angioplasty with stent placement  2008  . Spine surgery    . Eye surgery Right     lens implant  . Colonoscopy w/ polypectomy    . Insertion of dialysis catheter Right 01/30/2014    Procedure: INSERTION OF DIALYSIS CATHETER-RIGHT INTERNAL JUGULAR;  Surgeon: Elam Dutch, MD;  Location: [place];  Service: Vascular;  Laterality: Right;  . Av fistula placement Left 01/30/2014  Procedure: INSERTION OF ARTERIOVENOUS (AV) GORE-TEX GRAFT LEFT UPPER ARM;  Surgeon: Elam Dutch, MD;  Location: [place];  Service: Vascular;  Laterality: Left;  . Cardiac catheterization N/A 04/01/2015    Procedure: Right/Left Heart Cath and Coronary Angiography;  Surgeon: Leonie Man, MD;  Location: [place] CV LAB;  Service: Cardiovascular;  Laterality: N/A;    History  Smoking status  . Never Smoker   Smokeless tobacco  . Former Systems developer  . Types: Chew  . Quit date: 06/14/2010    History  Alcohol Use No    Social History   Social History  . Marital Status: Married    Spouse Name: N/A  . Number of Children: N/A  . Years of Education: N/A   Occupational History  . Not on file.   Social History Main Topics  . Smoking status: Never Smoker   . Smokeless tobacco: Former Systems developer    Types: [place] date: 06/14/2010  . Alcohol Use: No  . Drug Use: No  . Sexual Activity: Not on file    Other Topics Concern  . Not on file   Social History Narrative    Allergies  Allergen Reactions  . Shellfish Allergy Hives    Current Facility-Administered Medications  Medication Dose Route Frequency Provider Last Rate Last Dose  . 0.9 %  sodium chloride infusion  250 mL Intravenous PRN Leonie Man, MD      . 0.9 %  sodium chloride infusion  250 mL Intravenous PRN Leonie Man, MD      . acetaminophen (TYLENOL) tablet 650 mg  650 mg Oral Q4H PRN Leonie Man, MD      . ALPRAZolam Duanne Moron) tablet 0.25-0.5 mg  0.25-0.5 mg Oral Q4H PRN Ivin Poot, MD      . Derrill Memo ON 04/03/2015] aminocaproic acid (AMICAR) 10 g in sodium chloride 0.9 % 100 mL infusion   Intravenous To OR Kimberly B Hammons, RPH      . amLODipine (NORVASC) tablet 10 mg  10 mg Oral Daily Orvan Falconer, MD   10 mg at 04/02/15 1250  . aspirin tablet 325 mg  325 mg Oral Daily Samuella Cota, MD   325 mg at 04/02/15 1249  . atorvastatin (LIPITOR) tablet 80 mg  80 mg Oral q1800 Samuella Cota, MD   80 mg at 04/01/15 1807  . calcium acetate (PHOSLO) capsule 667 mg  667 mg Oral Q breakfast Orvan Falconer, MD   667 mg at 04/02/15 1257  . carvedilol (COREG) tablet 25 mg  25 mg Oral BID WC Orvan Falconer, MD   25 mg at 04/02/15 1257  . [START ON 04/03/2015] cefUROXime (ZINACEF) 1.5 g in dextrose 5 % 50 mL IVPB  1.5 g Intravenous To OR Kimberly B Hammons, RPH      . [START ON 04/03/2015] cefUROXime (ZINACEF) 750 mg in dextrose 5 % 50 mL IVPB  750 mg Intravenous To OR Kimberly B Hammons, RPH      . chlorhexidine (HIBICLENS) 4 % liquid 4 application  60 mL Topical Once Ivin Poot, MD       And  . Derrill Memo ON 04/03/2015] chlorhexidine (HIBICLENS) 4 % liquid 4 application  60 mL Topical Once Ivin Poot, MD      . Derrill Memo ON 04/03/2015] chlorhexidine (PERIDEX) 0.12 % solution 15 mL  15 mL Mouth/Throat Once Ivin Poot, MD      . cholecalciferol (VITAMIN D) tablet 1,000 Units  1,000 Units Oral Daily Orvan Falconer, MD   1,000  Units at 04/02/15 1250  . cinacalcet (SENSIPAR) tablet 30 mg  30 mg Oral BID WC Orvan Falconer, MD   30 mg at 03/31/15 1811  . cloNIDine (CATAPRES) tablet 0.1 mg  0.1 mg Oral TID Orvan Falconer, MD   0.1 mg at 04/02/15 1249  . [START ON 04/03/2015] dexmedetomidine (PRECEDEX) 400 MCG/100ML (4 mcg/mL) infusion  0.1-0.7 mcg/kg/hr Intravenous To OR Kimberly B Hammons, RPH      . [START ON 04/03/2015] diazepam (VALIUM) tablet 5 mg  5 mg Oral Once Ivin Poot, MD      . Derrill Memo ON 04/03/2015] DOPamine (INTROPIN) 800 mg in dextrose 5 % 250 mL (3.2 mg/mL) infusion  0-10 mcg/kg/min Intravenous To OR Kimberly B Hammons, RPH      . [START ON 04/03/2015] EPINEPHrine (ADRENALIN) 4 mg in dextrose 5 % 250 mL (0.016 mg/mL) infusion  0-10 mcg/min Intravenous To OR Kimberly B Hammons, RPH      . Ferric Citrate TABS 1 tablet  1 tablet Oral TID WC Orvan Falconer, MD   1 tablet at 03/31/15 0800  . [START ON 04/03/2015] heparin 2,500 Units, papaverine 30 mg in electrolyte-148 (PLASMALYTE-148) 500 mL irrigation   Irrigation To OR Kimberly B Hammons, RPH      . [START ON 04/03/2015] heparin 30,000 units/NS 1000 mL solution for CELLSAVER   Other To OR Kimberly B Hammons, RPH      . heparin ADULT infusion 100 units/mL (25000 units/250 mL)  1,400 Units/hr Intravenous Continuous Caren Griffins, MD 14 mL/hr at 04/02/15 0900 1,400 Units/hr at 04/02/15 0900  . insulin aspart (novoLOG) injection 0-9 Units  0-9 Units Subcutaneous TID WC Donielle Liston [place]ba, PA-C   0 Units at 04/02/15 1401  . insulin aspart (novoLOG) injection 4 Units  4 Units Subcutaneous TID WC Nani Skillern, PA-C   4 Units at 04/02/15 1342  . insulin glargine (LANTUS) injection 20 Units  20 Units Subcutaneous QHS Donielle M Zimmerman, PA-C      . [START ON 04/03/2015] insulin regular (NOVOLIN R,HUMULIN R) 250 Units in sodium chloride 0.9 % 250 mL (1 Units/mL) infusion   Intravenous To OR Kimberly B Hammons, RPH      . labetalol (NORMODYNE,TRANDATE) injection 20 mg  20  mg Intravenous Once Rolland Porter, MD   Stopped at 03/31/15 0142  . losartan (COZAAR) tablet 100 mg  100 mg Oral Daily Orvan Falconer, MD   100 mg at 04/02/15 1250  . [START ON 04/03/2015] magnesium sulfate (IV Push/IM) injection 40 mEq  40 mEq Other To OR Kimberly B Hammons, RPH      . [START ON 04/03/2015] metoprolol tartrate (LOPRESSOR) tablet 12.5 mg  12.5 mg Oral Once Ivin Poot, MD      . morphine 2 MG/ML injection 2 mg  2 mg Intravenous Q1H PRN Leonie Man, MD      . multivitamin with minerals tablet   Oral Daily Orvan Falconer, MD   1 tablet at 04/02/15 1250  . [START ON 04/03/2015] nitroGLYCERIN 50 mg in dextrose 5 % 250 mL (0.2 mg/mL) infusion  2-200 mcg/min Intravenous To OR Kimberly B Hammons, RPH      . ondansetron (ZOFRAN) injection 4 mg  4 mg Intravenous Q6H PRN Leonie Man, MD      . Derrill Memo ON 04/03/2015] phenylephrine (NEO-SYNEPHRINE) 20 mg in dextrose 5 % 250 mL (0.08 mg/mL) infusion  30-200 mcg/min Intravenous To OR Joelene Millin  B Hammons, RPH      . [START ON 04/03/2015] potassium chloride injection 80 mEq  80 mEq Other To OR Kimberly B Hammons, RPH      . sodium chloride 0.9 % injection 3 mL  3 mL Intravenous Q12H Leonie Man, MD   3 mL at 04/01/15 1815  . sodium chloride 0.9 % injection 3 mL  3 mL Intravenous PRN Leonie Man, MD      . sodium chloride 0.9 % injection 3 mL  3 mL Intravenous Q12H Leonie Man, MD   3 mL at 04/01/15 2124  . sodium chloride 0.9 % injection 3 mL  3 mL Intravenous PRN Leonie Man, MD      . temazepam (RESTORIL) capsule 15 mg  15 mg Oral Once PRN Ivin Poot, MD      . Derrill Memo ON 04/03/2015] vancomycin (VANCOCIN) 1,500 mg in sodium chloride 0.9 % 250 mL IVPB  1,500 mg Intravenous To OR Theone Murdoch Hammons, RPH        Prescriptions prior to admission  Medication Sig Dispense Refill Last Dose  . amLODipine (NORVASC) 10 MG tablet Take 10 mg by mouth daily.    03/30/2015 at Unknown time  . calcium acetate (PHOSLO) 667 MG capsule Take 667 mg  by mouth daily.   03/30/2015 at Unknown time  . carvedilol (COREG) 25 MG tablet Take 1 tablet (25 mg total) by mouth 2 (two) times daily with a meal.   03/30/2015 at 2130  . Cholecalciferol 1000 UNITS tablet Take 1,000 Units by mouth daily.   03/30/2015 at Unknown time  . cinacalcet (SENSIPAR) 30 MG tablet Take 30 mg by mouth 2 (two) times daily with a meal.   03/30/2015 at Unknown time  . cloNIDine (CATAPRES) 0.1 MG tablet Take 0.1 mg by mouth 3 (three) times daily.    03/30/2015 at Unknown time  . Ferric Citrate (AURYXIA) 1 GM 210 MG(FE) TABS Take 1 tablet by mouth 3 (three) times daily with meals.   03/30/2015 at Unknown time  . insulin glargine (LANTUS) 100 UNIT/ML injection Inject 40 Units into the skin at bedtime.    03/29/2015 at Unknown time  . losartan (COZAAR) 100 MG tablet Take 1 tablet (100 mg total) by mouth daily.   03/30/2015 at Unknown time  . Multiple Vitamin (DAILY VITAMIN PO) Take 1 tablet by mouth daily.   03/30/2015 at Unknown time    Family History  Problem Relation Age of Onset  . Diabetes Other   . Diabetes Mother   . Hypertension Mother   . Diabetes Father   . Hypertension Father      Review of Systems:  Right-hand dominant AV fistula left upper trauma he No history thoracic trauma, rib fracture, pneumothorax No history of  pneumonia or productive cough  History of hypertension    Cardiac Review of Systems: Y or N  Chest Pain [  yes  ]  Resting SOB [  yes ] Exertional SOB  [place]  ]  Orthopnea [ yes ]   Pedal Edema [  no ]    Palpitations [no  ] Syncope  [  no]   Presyncope [  no ]  General Review of Systems: [Y] = yes [  ]=no Constitional: recent weight change [  ]; anorexia [  ]; fatigue [  ]; nausea [  ]; night sweats [  ]; fever [  ]; or chills [  ]  Dental: poor dentition[  ]; Last Dentist visit: one year  Eye : blurred vision [  ]; diplopia [   ]; vision changes [yes status post cataract  surgery  ];  Amaurosis fugax[  ]; Resp: cough [  ];  wheezing[  ];  hemoptysis[  ]; shortness of breath[  yes]; paroxysmal nocturnal dyspnea[  ]; dyspnea on exertion[ yes ]; or orthopnea[  ];  GI:  gallstones[  ], vomiting[  ];  dysphagia[  ]; melena[  ];  hematochezia [  ]; heartburn[  ];   Hx of  Colonoscopy[  ]; GU: kidney stones [  ]; hematuria[  ];   dysuria [  ];  nocturia[  ];  history of     obstruction [  ]; urinary frequency [  ]             Skin: rash, swelling[  ];, hair loss[  ];  peripheral edema[  ];  or itching[  ]; Musculosketetal: myalgias[  ];  joint swelling[  ];  joint erythema[  ];  joint pain[  ];  back pain[  ];  Heme/Lymph: bruising[  ];  bleeding[  ];  anemia[  ];  Neuro: TIA[  ];  headaches[  ];  stroke[  ];  vertigo[  ];  seizures[  ];   paresthesias[  ];  difficulty walking[  ];  Psych:depression[  ]; anxiety[  ];  Endocrine: diabetes[yes  ];  thyroid dysfunction[  ];  Immunizations: Flu [  ]; Pneumococcal[  ];  Other:  Physical Exam: BP 159/81 mmHg  Pulse 75  Temp(Src) 98.2 F (36.8 C) (Oral)  Resp 18  Ht 6\' 5"  (1.956 m)  Wt 198 lb 3.1 oz (89.9 kg)  BMI 23.50 kg/m2  SpO2 100%      Physical Exam  General: Middle-aged  AA male in hemodialysis unit no acute distress HEENT: Normocephalic pupils equal , dentition adequate Neck: Supple without JVD, adenopathy, or bruit Chest: Clear to auscultation, symmetrical breath sounds, no rhonchi, no tenderness             or deformity Cardiovascular: Regular rate and [id number] holosystolicmurmur, no gallop, peripheral pulses             palpable in all extremities Abdomen:  Soft, nontender, no palpable mass or organomegaly Extremities: Warm, well-perfused, no clubbing cyanosis edema or tenderness,AV fistula in left upper extremity with thrill              no venous stasis changes of the legs Rectal/GU: Deferred Neuro: Grossly non--focal and symmetrical throughout Skin: Clean and dry without rash or  ulceration    Diagnostic Studies & Laboratory data:     Recent Radiology Findings:   No results found.   I have independently reviewed the above radiologic studies.  Recent Lab Findings: Lab Results  Component Value Date   WBC 8.4 04/02/2015   HGB 8.8* 04/02/2015   HCT 25.3* 04/02/2015   PLT 215 04/02/2015   GLUCOSE 125* 04/02/2015   CHOL 210* 04/01/2015   TRIG 68 04/01/2015   HDL 38* 04/01/2015   LDLCALC 158* 04/01/2015   ALT 18 04/01/2015   AST 29 04/01/2015   NA 135 04/02/2015   K 4.2 04/02/2015   CL 89* 04/02/2015   CREATININE 13.03* 04/02/2015   BUN 90* 04/02/2015   CO2 27 04/02/2015   INR 1.19 04/01/2015   HGBA1C 9.3* 01/15/2014      Assessment / Plan:     Acute  non-ST  elevation MI   Severe three-vessel CAD with preserved LV systolic function     Acute pulmonary edema, acute respiratory insufficiency     Unstable angina     Chronic renal failure on hemodialysis   I discussed CABG in detail the patient including reviewing the indications benefits alternatives and details of surgery. I've discussed the risks of stroke, bleeding, transfusion requirement, infection, postoperative lung problems including pleural effusion, and death. He demonstrates his understanding and agrees to proceed with surgery under informed consent    @ME1 @ 04/02/2015 4:04 PM       and And and and and and and and and and and and and and

## 2015-04-02 NOTE — Progress Notes (Signed)
  Echocardiogram 2D Echocardiogram has been performed.  Tresa Res 04/02/2015, 4:22 PM

## 2015-04-02 NOTE — Progress Notes (Signed)
VASCULAR LAB PRELIMINARY  PRELIMINARY  PRELIMINARY  PRELIMINARY  Pre-op Cardiac Surgery  Carotid Findings:  Bilateral ICAs 1-39%  Upper Extremity Right Left  Brachial Pressures 156 Dialysis shunt in place  Radial Waveforms Normal Normal  Ulnar Waveforms Normal Normal  Palmar Arch (Allen's Test) WNL    Findings:      Lower  Extremity Right Left  Dorsalis Pedis 106 124  Posterior Tibial 128 129  Ankle/Brachial Indices 0.82 0.83    Findings:  Bilateral ABIs suggest mild arterial insufficiency disease at rest.   Oda Cogan D, RVT 04/02/2015, 4:02 PM

## 2015-04-02 NOTE — Progress Notes (Signed)
[place] for Heparin Indication: CAD   Allergies  Allergen Reactions  . Shellfish Allergy Hives    Patient Measurements: Height: 6\' 5"  (195.6 cm) Weight: 198 lb 3.1 oz (89.9 kg) IBW/kg (Calculated) : 89.1  Vital Signs: Temp: 98.4 F (36.9 C) (10/19 1636) Temp Source: Oral (10/19 1636) BP: 155/74 mmHg (10/19 1636) Pulse Rate: 68 (10/19 1636)  Labs:  Recent Labs  03/30/15 2333  03/31/15 1831  04/01/15 0140 04/01/15 0711 04/01/15 1710 04/02/15 0635 04/02/15 0746 04/02/15 1635  HGB 10.2*  < >  --   --  9.0*  --   --  8.3* 8.8*  --   HCT 30.0*  < >  --   --  26.7*  --   --  23.8* 25.3*  --   PLT 233  < >  --   --  206  --   --  221 215  --   LABPROT 14.5  --   --   --  15.3*  --   --   --   --   --   INR 1.11  --   --   --  1.19  --   --   --   --   --   HEPARINUNFRC  --   --   --   < > 0.37  --   --  0.25*  --  0.33  CREATININE 12.72*  < >  --   --  10.01* 10.91* 11.73*  --  13.03*  --   TROPONINI  --   < > 14.46*  --  8.51* 6.25*  --   --   --   --   < > = values in this interval not displayed.  Estimated Creatinine Clearance: 7.3 mL/min (by C-G formula based on Cr of 13.03).  Assessment: 64 y.o. male with CAD awaiting CABG 10/20 PM Heparin level therapeutic  Goal of Therapy:  Heparin level 0.3-0.7 units/ml Monitor platelets by anticoagulation protocol: Yes   Plan:  Continue heparin at 1400 units / hr Follow up post OHS  Thank you Anette Guarneri, PharmD [phone]  Tad Moore 04/02/2015,5:09 PM

## 2015-04-02 NOTE — Progress Notes (Signed)
[facility] Progress Note   Subjective: alert, no distress. 3V disease by cath yesterday.   Filed Vitals:   04/02/15 0930 04/02/15 1000 04/02/15 1030 04/02/15 1100  BP: 114/66 143/75 164/80 141/75  Pulse: 71 73 73 73  Temp:      TempSrc:      Resp:      Height:      Weight:      SpO2:       Exam: Alert, no distress, calm No jvd or bruits Chest clear bilat to bases RRR no mrg Abd soft ntnd no mass or ascites GU normal male LE's no edema , ulcers or wounds LUA AVF +bruit, no lesions or aneurysms Neuro alert, nf ox 3  HD MWF ([facility])    Assessment: 1 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 , for CABG tomorrow 2 ESRD HD mwf in [place] 3 HTN longstanding, on norvasc/ coreg/ clon/ losartan 4 Vol looks ok today 5 DM2 on insulin 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  Recent Labs Lab 04/01/15 0711 04/01/15 1710 04/02/15 0746  AST 29  --   --   ALT 18  --   --   ALKPHOS 48  --   --   BILITOT 0.6  --   --   PROT 6.8  --   --   ALBUMIN 3.2* 3.3* 3.1*    Recent Labs Lab 03/30/15 2333  04/01/15 0140 04/02/15 0635 04/02/15 0746  WBC 12.8*  < > 6.1 8.1 8.4  NEUTROABS 10.6*  --   --   --  5.8  HGB 10.2*  < > 9.0* 8.3* 8.8*  HCT 30.0*  < > 26.7* 23.8* 25.3*  MCV 94.9  < > 93.0 91.2 92.3  PLT 233  < > 206 221 215  < > = values in this interval not displayed. Marland Kitchen  amLODipine  10 mg Oral Daily  . aspirin  325 mg Oral Daily  . atorvastatin  80 mg Oral q1800  . bisacodyl  5 mg Oral Once  . calcium acetate  667 mg Oral Q breakfast  . carvedilol  25 mg Oral BID WC  . chlorhexidine  60 mL Topical Once   And  . [START ON 04/03/2015] chlorhexidine  60 mL Topical Once  . [START ON 04/03/2015] chlorhexidine  15 mL Mouth/Throat Once  . cholecalciferol  1,000 Units Oral Daily  . cinacalcet  30 mg Oral BID WC  . cloNIDine  0.1 mg Oral TID  . [START ON 04/03/2015] diazepam  5 mg Oral Once  . Ferric Citrate  1 tablet Oral TID WC  . insulin aspart  0-9 Units Subcutaneous TID  WC  . insulin aspart  4 Units Subcutaneous TID WC  . insulin aspart  4 Units Subcutaneous TID WC  . insulin glargine  20 Units Subcutaneous QHS  . labetalol  20 mg Intravenous Once  . losartan  100 mg Oral Daily  . [START ON 04/03/2015] metoprolol tartrate  12.5 mg Oral Once  . multivitamin with minerals   Oral Daily  . sodium chloride  3 mL Intravenous Q12H  . sodium chloride  3 mL Intravenous Q12H   . heparin 1,400 Units/hr (04/02/15 0900)   sodium chloride, sodium chloride, sodium chloride, sodium chloride, acetaminophen, ALPRAZolam, alteplase, heparin, lidocaine (PF), lidocaine-prilocaine, morphine injection, ondansetron (ZOFRAN) IV, pentafluoroprop-tetrafluoroeth, sodium chloride, sodium chloride, temazepam

## 2015-04-02 NOTE — Progress Notes (Addendum)
VASCULAR LAB PRELIMINARY  PRELIMINARY  PRELIMINARY  PRELIMINARY  Right Lower Extremity Vein Map    Right Great Saphenous Vein   Segment Diameter Comment  1. Origin 6.49mm   2. High Thigh mm   3. Mid Thigh 2.57mm   4. Low Thigh 4.69mm   5. At Knee 3.67mm   6. High Calf 4.78mm Small branch  7. Low Calf 3.57mm   8. Ankle 3.80mm    Left Lower Extremity Vein Map    Left Great Saphenous Vein   Segment Diameter Comment  1. Origin 5.62mm   2. High Thigh mm   3. Mid Thigh 3.42mm   4. Low Thigh 3.58mm Small branch  5. At Knee 3.18mm   6. High Calf 2.20mm Small branch  7. Low Calf 1.56mm   8. Ankle 1.29mm      Oda Cogan, BS, RDMS, RVT

## 2015-04-02 NOTE — Progress Notes (Signed)
PROGRESS NOTE  [name] [id number] DOB: 06-06-1951 DOA: 03/30/2015 PCP: [name]   HPI: [name] is a 64 y/o male with PMH of ESRD on hemodialysis, IDDM, CAD s/p PCI, HTN who presented to the ED on 10/17 with complaints of SOB. CXR at that time showed vascular congestion and concern for bilateral pulmonary edema. Labs in the ED with creatinine near 13 and NSTEMI with troponin of 13.87. He was admitted for emergent hemodialysis and further cardiac evaluation. Cardiac catheterization performed 10/18 demonstrated severe 3 vessel CAD including 90% restenosis of the proximal LAD. CT evaluated patient 10/19 with plan for CABG 10/20 in the AM.   Subjective / 24 H Interval events - Hypertensive this AM with systolic BP [id number] otherwise vital signs stable  - denies CP, SOB, abdominal pain, N/V - He has spoken with CT surgery and is aware of plan for surgery in the AM, he is agreeable to this plan   Assessment/Plan: Principal Problem:   NSTEMI (non-ST elevated myocardial infarction) [facility]) Active Problems:   Type 2 diabetes mellitus with diabetic nephropathy ([place])   Essential hypertension   Acute on chronic renal failure (HCC)   Fluid overload   SOB (shortness of breath)   Volume overload   ESRD on dialysis [facility])   Secondary hypertension, unspecified   Acute diastolic heart failure (HCC)  NSTEMI in the setting of multivessel CAD - Patient evaluated by cardiology with LHC performed 10/18 showing severe multi-vessel CAD  - Initial troponin 13.87 down to 6.25 on 10/18 - TCV consulted, will perform CABG 10/20 AM  - Continue ASA, Lipitor, Carvedilol, Losartan and heparin per cardiology  ESRD on dialysis   -  Nephrology following  Type II DM w/ Diabetic Nephropathy  - Insulin dependent diabetic currently on Novolog sliding scale and Lantus  - CBGs stable; 190-200s  Acute heart failure NOS - Patient was volume overloaded upon admission with SOB and pulmonary  edema  - Echo pending - Continue Amlodipine, Losartan, clonidine and Carvedilol per cardiology   HTN  - Systolic BP 0000000 since admission - Will continue to monitor    Diet: Diet renal/carb modified with fluid restriction Diet-HS Snack?: Nothing; Room service appropriate?: Yes; Fluid consistency:: Thin Diet NPO time specified Fluids: None DVT Prophylaxis: Anticoagulated with Heparin   Code Status: Full Code Family Communication: Family not present at bedside at this time   Disposition Plan: TBD, CABG tomorrow  Consultants:  Cardiothoracic Surgery   Cardiology   Nephrology   Procedures:  None   Antibiotics:  None  Anti-infectives    None       Studies  No results found.  Objective  Filed Vitals:   04/02/15 0930 04/02/15 1000 04/02/15 1030 04/02/15 1100  BP: 114/66 143/75 164/80 141/75  Pulse: 71 73 73 73  Temp:      TempSrc:      Resp:      Height:      Weight:      SpO2:        Intake/Output Summary (Last 24 hours) at 04/02/15 1226 Last data filed at 04/01/15 2124  Gross per 24 hour  Intake    240 ml  Output      0 ml  Net    240 ml   Filed Weights   04/01/15 0644 04/02/15 0441 04/02/15 0710  Weight: 92.307 kg (203 lb 8 oz) 92.7 kg (204 lb 5.9 oz) 93.2 kg (205 lb 7.5 oz)    Exam:  GENERAL: NAD,  patient is resting comfortably in bed   HEENT: head NCAT, no scleral icterus. Mucous membranes are moist.   NECK: Supple. No LAD  LUNGS: Clear to auscultation. No wheezing or crackles  HEART: Regular rate and rhythm with grade 2/6 systolic murmur heard at LSB. no JVD, no peripheral edema  ABDOMEN: Soft, non-distended, non-tender. Positive bowel sounds.  EXTREMITIES: Without any cyanosis or clubbing. Good muscle tone  NEUROLOGIC: No focal neurologic deficits.   PSYCHIATRIC: Normal mood and affect  Data Reviewed: Basic Metabolic Panel:  Recent Labs Lab 03/31/15 0353 04/01/15 0140 04/01/15 0711 04/01/15 1710 04/02/15 0746  NA  135 132* 130* 129* 135  K 4.2 5.0 4.8 4.6 4.2  CL 97* 91* 89* 89* 89*  CO2 24 28 26 24 27   GLUCOSE 189* 275* 301* 264* 125*  BUN 87* 55* 63* 74* 90*  CREATININE 13.68* 10.01* 10.91* 11.73* 13.03*  CALCIUM 8.9 8.8* 8.7* 9.0 9.3  PHOS 6.3*  --   --  6.1* 6.4*   Liver Function Tests:  Recent Labs Lab 03/31/15 0353 04/01/15 0711 04/01/15 1710 04/02/15 0746  AST  --  29  --   --   ALT  --  18  --   --   ALKPHOS  --  48  --   --   BILITOT  --  0.6  --   --   PROT  --  6.8  --   --   ALBUMIN 3.5 3.2* 3.3* 3.1*   CBC:  Recent Labs Lab 03/30/15 2333 03/31/15 0353 04/01/15 0140 04/02/15 0635 04/02/15 0746  WBC 12.8* 11.3* 6.1 8.1 8.4  NEUTROABS 10.6*  --   --   --  5.8  HGB 10.2* 8.9* 9.0* 8.3* 8.8*  HCT 30.0* 25.5* 26.7* 23.8* 25.3*  MCV 94.9 94.1 93.0 91.2 92.3  PLT 233 199 206 221 215   Cardiac Enzymes:  Recent Labs Lab 03/31/15 1312 03/31/15 1831 04/01/15 0140 04/01/15 0711  TROPONINI 13.87* 14.46* 8.51* 6.25*   BNP (last 3 results)  Recent Labs  03/21/15 0056  BNP 569.0*   CBG:  Recent Labs Lab 04/01/15 0752 04/01/15 1111 04/01/15 1437 04/01/15 1633 04/01/15 2154  GLUCAP 281* 222* 284* 271* 239*    Scheduled Meds: . amLODipine  10 mg Oral Daily  . aspirin  325 mg Oral Daily  . atorvastatin  80 mg Oral q1800  . bisacodyl  5 mg Oral Once  . calcium acetate  667 mg Oral Q breakfast  . carvedilol  25 mg Oral BID WC  . chlorhexidine  60 mL Topical Once   And  . [START ON 04/03/2015] chlorhexidine  60 mL Topical Once  . [START ON 04/03/2015] chlorhexidine  15 mL Mouth/Throat Once  . cholecalciferol  1,000 Units Oral Daily  . cinacalcet  30 mg Oral BID WC  . cloNIDine  0.1 mg Oral TID  . [START ON 04/03/2015] diazepam  5 mg Oral Once  . Ferric Citrate  1 tablet Oral TID WC  . insulin aspart  0-9 Units Subcutaneous TID WC  . insulin aspart  4 Units Subcutaneous TID WC  . insulin aspart  4 Units Subcutaneous TID WC  . insulin glargine  20  Units Subcutaneous QHS  . labetalol  20 mg Intravenous Once  . losartan  100 mg Oral Daily  . [START ON 04/03/2015] metoprolol tartrate  12.5 mg Oral Once  . multivitamin with minerals   Oral Daily  . sodium chloride  3 mL Intravenous Q12H  .  sodium chloride  3 mL Intravenous Q12H   Continuous Infusions: . heparin 1,400 Units/hr (04/02/15 0900)    Elita Boone, PA-S  Marzetta Board, MD  Pager [phone].  If 7 PM - 7 AM, please contact night-coverage at [email or web address], password [email or web address] [facility]  04/02/2015, 12:26 PM  LOS: 2 days

## 2015-04-02 NOTE — Progress Notes (Signed)
Inpatient Diabetes Program Recommendations  AACE/ADA: New Consensus Statement on Inpatient Glycemic Control (2015)  Target Ranges:  Prepandial:   less than 140 mg/dL      Peak postprandial:   less than 180 mg/dL (1-2 hours)      Critically ill patients:  140 - 180 mg/dL  Results for [name], [name] (MRN [id number]) as of 04/02/2015 09:49  Ref. Range 04/01/2015 07:11 04/01/2015 17:10 04/02/2015 07:46  Glucose Latest Ref Range: 65-99 mg/dL 301 (H) 264 (H) 125 (H)   Results for [name], [name] (MRN [id number]) as of 04/02/2015 09:49  Ref. Range 04/01/2015 07:52 04/01/2015 11:11 04/01/2015 14:37 04/01/2015 16:33 04/01/2015 21:54  Glucose-Capillary Latest Ref Range: 65-99 mg/dL 281 (H) 222 (H) 284 (H) 271 (H) 239 (H)   Review of Glycemic Control   Current orders for Inpatient glycemic control: Lantus 30 units BID, Novolog 0-9 units TID with meals  Inpatient Diabetes Program Recommendations: Insulin - Basal: In reviewing the chart, noted patient only took Lantus 20 units last night (was ordered Lantus 40 units but patient would only take 20 units according to [facility]). Fasting glucose is 125 mg/dl this morning. Noted Lantus increased to 30 units BID today. Recommend changing Lantus order to Lantus 20 untis QHS especially since patient is no longer ordered steroids and will be NPO after midnight.. Insulin - Meal Coverage: Please consider ordering Novolog 4 units TID with meals for meal coverage (in addition to Novolog correction scale).  Thanks, Barnie Alderman, RN, MSN, CCRN, CDE Diabetes Coordinator Inpatient Diabetes Program [phone] (Team Pager from [place] to [place]) [phone] ([place] office) [phone] [facility] office) [phone] [facility] office)

## 2015-04-02 NOTE — Progress Notes (Signed)
1 Day Post-Op Procedure(s) (LRB): Right/Left Heart Cath and Coronary Angiography (N/A) Subjective: Non stemi with acute pulmonary edema/ respiratory failure Cath reviewed- 3 vessel CAD- echo pending Plan CABg in am 10-20 Full consult to follow  Objective: Vital signs in last 24 hours: Temp:  [97.4 F (36.3 C)-98.2 F (36.8 C)] 97.4 F (36.3 C) (10/19 0710) Pulse Rate:  [0-192] 71 (10/19 0803) Cardiac Rhythm:  [-] Normal sinus rhythm;Heart block;Bundle branch block (10/19 0702) Resp:  [0-59] 18 (10/19 0710) BP: (135-185)/(55-95) 170/88 mmHg (10/19 0803) SpO2:  [0 %-100 %] 99 % (10/19 0710) Weight:  [204 lb 5.9 oz (92.7 kg)-205 lb 7.5 oz (93.2 kg)] 205 lb 7.5 oz (93.2 kg) (10/19 0710)  Hemodynamic parameters for last 24 hours:  nsr  Intake/Output from previous day: 10/18 0701 - 10/19 0700 In: 240 [P.O.:240] Out: -  Intake/Output this shift:      Lab Results:  Recent Labs  04/02/15 0635 04/02/15 0746  WBC 8.1 8.4  HGB 8.3* 8.8*  HCT 23.8* 25.3*  PLT 221 215   BMET:  Recent Labs  04/01/15 1710 04/02/15 0746  NA 129* 135  K 4.6 4.2  CL 89* 89*  CO2 24 27  GLUCOSE 264* 125*  BUN 74* 90*  CREATININE 11.73* 13.03*  CALCIUM 9.0 9.3    PT/INR:  Recent Labs  04/01/15 0140  LABPROT 15.3*  INR 1.19   ABG    Component Value Date/Time   PHART 7.434 04/01/2015 1407   HCO3 23.6 04/01/2015 1407   TCO2 25 04/01/2015 1407   O2SAT 96.0 04/01/2015 1407   CBG (last 3)   Recent Labs  04/01/15 1437 04/01/15 1633 04/01/15 2154  GLUCAP 284* 271* 239*    Assessment/Plan: S/P Procedure(s) (LRB): Right/Left Heart Cath and Coronary Angiography (N/A) CABG in AM HD today   LOS: 2 days    Tharon Aquas Trigt III 04/02/2015

## 2015-04-02 NOTE — Progress Notes (Addendum)
Patient Name: [name] Date of Encounter: 04/02/2015  Principal Problem:   NSTEMI (non-ST elevated myocardial infarction) [facility]) Active Problems:   Type 2 diabetes mellitus with diabetic nephropathy (HCC)   Essential hypertension   Acute on chronic renal failure (HCC)   Fluid overload   SOB (shortness of breath)   Volume overload   ESRD on dialysis ([place])   Secondary hypertension, unspecified   Acute diastolic heart failure [facility])    Primary Cardiologist: Dr. Johnsie Cancel Patient Profile: 64 y.o.male w/ PMH of CAD (s/p PCI to LAD in 2005), ESRD (on dialysis), Type 2 DM, and HTN admitted on 03/31/2015 for shortness of breath and found to have elevated creatinine of 13.68 and troponin of 13.87. LHC on 04/01/2015 showed severe multivessel disease and CT Surgery consult was recommended.   SUBJECTIVE: Seen in HD. Denies any chest pain or palpitations. No complications from catheterization yesterday.   OBJECTIVE Filed Vitals:   04/02/15 0710 04/02/15 0732 04/02/15 0734 04/02/15 0803  BP: 184/81 176/86 181/84 170/88  Pulse: 75 72 72 71  Temp: 97.4 F (36.3 C)     TempSrc: Oral     Resp: 18     Height:      Weight: 205 lb 7.5 oz (93.2 kg)     SpO2: 99%       Intake/Output Summary (Last 24 hours) at 04/02/15 0839 Last data filed at 04/01/15 2124  Gross per 24 hour  Intake    240 ml  Output      0 ml  Net    240 ml   Filed Weights   04/01/15 0644 04/02/15 0441 04/02/15 0710  Weight: 203 lb 8 oz (92.307 kg) 204 lb 5.9 oz (92.7 kg) 205 lb 7.5 oz (93.2 kg)    PHYSICAL EXAM General: Well developed, well nourished, male in no acute distress. Head: Normocephalic, atraumatic.  Neck: Supple without bruits, JVD not elevated. Lungs:  Resp regular and unlabored, CTA without wheezing or rales. Heart: RRR, S1, S2, no S3, S4, 2/6 SEM; no rub. Abdomen: Soft, non-tender, non-distended with normoactive bowel sounds. No hepatomegaly. No rebound/guarding. No obvious abdominal  masses. Extremities: No clubbing, cyanosis, or edema. Distal pedal pulses are 2+ bilaterally. Neuro: Alert and oriented X 3. Moves all extremities spontaneously. Psych: Normal affect.   LABS: CBC: Recent Labs  03/30/15 2333  04/02/15 0635 04/02/15 0746  WBC 12.8*  < > 8.1 8.4  NEUTROABS 10.6*  --   --  5.8  HGB 10.2*  < > 8.3* 8.8*  HCT 30.0*  < > 23.8* 25.3*  MCV 94.9  < > 91.2 92.3  PLT 233  < > 221 215  < > = values in this interval not displayed. INR: Recent Labs  04/01/15 0140  INR 0000000   Basic Metabolic Panel: Recent Labs  04/01/15 1710 04/02/15 0746  NA 129* 135  K 4.6 4.2  CL 89* 89*  CO2 24 27  GLUCOSE 264* 125*  BUN 74* 90*  CREATININE 11.73* 13.03*  CALCIUM 9.0 9.3  PHOS 6.1* 6.4*   Liver Function Tests: Recent Labs  04/01/15 0711 04/01/15 1710 04/02/15 0746  AST 29  --   --   ALT 18  --   --   ALKPHOS 48  --   --   BILITOT 0.6  --   --   PROT 6.8  --   --   ALBUMIN 3.2* 3.3* 3.1*   Cardiac Enzymes: Recent Labs  03/31/15 1831 04/01/15 0140  04/01/15 0711  TROPONINI 14.46* 8.51* 6.25*   BNP:  B NATRIURETIC PEPTIDE  Date/Time Value Ref Range Status  03/21/2015 12:56 AM 569.0* 0.0 - 100.0 pg/mL Final   Fasting Lipid Panel: Recent Labs  04/01/15 0140  CHOL 210*  HDL 38*  LDLCALC 158*  TRIG 68  CHOLHDL 5.5   TELE:   Not currently connected. No significant arrhythmias or atopic events overnight.     Cardiac Catheterization: 04/02/2015 1. Ost LAD to Prox LAD lesion, 90% stenosed. The lesion was previously treated with a stent (unknown type) in 2006. Distal stent edge: Mid LAD lesion, 70% stenosed. 2. Ost 1st Diag to 1st Diag lesion, 90% stenosed. Vessel jailed by prior stent. 3. Prox RCA lesion, 40% stenosed. Mid RCA-1 lesion, 80% stenosed. 4. Ost Ramus lesion, 70% stenosed. Ramus-1 lesion, 60% stenosed. 5. Prox Cx lesion, 70% stenosed. Mid Cx lesion, 80% stenosed. 6. The left ventricular systolic function is normal. 7. Normal  Right Heart Pressures & LVEDP/PCWP 8. No obvious regional WMA   The patient has severe multivessel disease - notably a ~90% focal in-stent restenosis of the proximal LAD involving the ostium of D1. There is severe disease in the mid RCA and ostial-proximal Ramus/OM1 as well as Circumflex.  The Circumflex does not perfuse a significant distribution compared to the Ramus/OM1.  He is a diabetic on HD with proximal LAD disease. Recommendation is to consider CABG vs. Multivessel PCI of at least the LAD & RCA.  LAD PCI may lead to loss of the major diagonal branch & is a long area of ISR.  Plan:  Standard post-femoral cath care with sheath removal  CT Surgical consultation  Aggressive RF modifications - BP control & lipid management  Current Medications:  . amLODipine  10 mg Oral Daily  . aspirin  325 mg Oral Daily  . atorvastatin  80 mg Oral q1800  . calcium acetate  667 mg Oral Q breakfast  . carvedilol  25 mg Oral BID WC  . cholecalciferol  1,000 Units Oral Daily  . cinacalcet  30 mg Oral BID WC  . cloNIDine  0.1 mg Oral TID  . Ferric Citrate  1 tablet Oral TID WC  . insulin aspart  0-9 Units Subcutaneous TID WC  . insulin glargine  40 Units Subcutaneous QHS  . labetalol  20 mg Intravenous Once  . losartan  100 mg Oral Daily  . multivitamin with minerals   Oral Daily  . sodium chloride  3 mL Intravenous Q12H  . sodium chloride  3 mL Intravenous Q12H   . heparin 1,200 Units/hr (04/01/15 2124)    ASSESSMENT AND PLAN:  1. NSTEMI (non-ST elevated myocardial infarction) ([place]) - s/p PCI to LAD in 2005. Followed by [facility] but not since 2013. - initial troponin elevated to 13.87. Cyclic values have been 14.46, 8.51 and 6.25. Denies any chest pain at this time. - continue ASA, statin, BB, ARB, and Heparin. - LHC on 04/01/2015 showed severe multivessel disease - notably a ~90% focal in-stent restenosis of the proximal LAD involving the ostium of D1. CT Surgical consultation was  recommended. - Planning for CABG on 04/03/2015 per Dr. Lucianne Lei Trigt's note. Listed on OR schedule at 0715.  2. ESRD - missed dialysis all last week - receiving dialysis today.  3. Type 2 diabetes mellitus with diabetic nephropathy ([place]) - per admitting team  4. Essential hypertension - BP has been 114/55 - 185/95 while admitted. - continue CCB, ARB, clonidine, and BB.  Signed, [place]  Wynelle Fanny , PA-C 8:39 AM 04/02/2015 Pager: [phone] Just returned from dialysis. States feels well. No chest pain or dyspnea on room air. Telemetry shows NSR. For CABG in am. Agree with above assessment.

## 2015-04-02 NOTE — Progress Notes (Signed)
[place] for Heparin Indication: CAD   Allergies  Allergen Reactions  . Shellfish Allergy Hives    Patient Measurements: Height: 6\' 5"  (195.6 cm) Weight: 204 lb 5.9 oz (92.7 kg) IBW/kg (Calculated) : 89.1  Vital Signs: Temp: 98 F (36.7 C) (10/19 0441) Temp Source: Oral (10/19 0441)  Labs:  Recent Labs  03/30/15 2333 03/31/15 0353  03/31/15 1831 03/31/15 2353 04/01/15 0140 04/01/15 0711 04/01/15 1710 04/02/15 0635  HGB 10.2* 8.9*  --   --   --  9.0*  --   --  8.3*  HCT 30.0* 25.5*  --   --   --  26.7*  --   --  23.8*  PLT 233 199  --   --   --  206  --   --  221  LABPROT 14.5  --   --   --   --  15.3*  --   --   --   INR 1.11  --   --   --   --  1.19  --   --   --   HEPARINUNFRC  --   --   --   --  0.44 0.37  --   --  0.25*  CREATININE 12.72* 13.68*  --   --   --  10.01* 10.91* 11.73*  --   TROPONINI  --   --   < > 14.46*  --  8.51* 6.25*  --   --   < > = values in this interval not displayed.  Estimated Creatinine Clearance: 8.1 mL/min (by C-G formula based on Cr of 11.73).  Assessment: 64 y.o. male with CAD awaiting CABG vs PCI, for heparin  Goal of Therapy:  Heparin level 0.3-0.7 units/ml Monitor platelets by anticoagulation protocol: Yes   Plan:  Increase Heparin 1400 units/hr Check heparin level in 8 hours.   [name], [place] Curb 04/02/2015,7:52 AM

## 2015-04-03 ENCOUNTER — Inpatient Hospital Stay (HOSPITAL_COMMUNITY): Payer: Medicare Other | Admitting: Anesthesiology

## 2015-04-03 ENCOUNTER — Encounter (HOSPITAL_COMMUNITY): Payer: Self-pay | Admitting: Certified Registered Nurse Anesthetist

## 2015-04-03 ENCOUNTER — Encounter (HOSPITAL_COMMUNITY)
Admission: EM | Disposition: A | Discharge status: Home Health | Payer: Medicare Other | Source: Home / Self Care | Attending: Cardiothoracic Surgery

## 2015-04-03 ENCOUNTER — Inpatient Hospital Stay (HOSPITAL_COMMUNITY): Payer: Medicare Other

## 2015-04-03 HISTORY — PX: TEE WITHOUT CARDIOVERSION: SHX5443

## 2015-04-03 HISTORY — PX: CORONARY ARTERY BYPASS GRAFT: SHX141

## 2015-04-03 LAB — POCT I-STAT 3, ART BLOOD GAS (G3+)
Acid-Base Excess: 6 mmol/L — ABNORMAL HIGH (ref 0.0–2.0)
Acid-Base Excess: 2 mmol/L (ref 0.0–2.0)
Acid-base deficit: 2 mmol/L (ref 0.0–2.0)
Acid-base deficit: 2 mmol/L (ref 0.0–2.0)
Acid-base deficit: 3 mmol/L — ABNORMAL HIGH (ref 0.0–2.0)
Acid-base deficit: 6 mmol/L — ABNORMAL HIGH (ref 0.0–2.0)
Bicarbonate: 30.4 mEq/L — ABNORMAL HIGH (ref 20.0–24.0)
Bicarbonate: 27.3 mEq/L — ABNORMAL HIGH (ref 20.0–24.0)
Bicarbonate: 23.5 mEq/L (ref 20.0–24.0)
Bicarbonate: 22.8 mEq/L (ref 20.0–24.0)
Bicarbonate: 22.1 mEq/L (ref 20.0–24.0)
Bicarbonate: 18.8 mEq/L — ABNORMAL LOW (ref 20.0–24.0)
O2 Saturation: 100 %
O2 Saturation: 100 %
O2 Saturation: 100 %
O2 Saturation: 94 %
O2 Saturation: 98 %
O2 Saturation: 97 %
Patient temperature: 36.7
Patient temperature: 97.7
TCO2: 32 mmol/L (ref 0–100)
TCO2: 29 mmol/L (ref 0–100)
TCO2: 25 mmol/L (ref 0–100)
TCO2: 24 mmol/L (ref 0–100)
TCO2: 23 mmol/L (ref 0–100)
TCO2: 20 mmol/L (ref 0–100)
pCO2 arterial: 45.3 mmHg — ABNORMAL HIGH (ref 35.0–45.0)
pCO2 arterial: 45.8 mmHg — ABNORMAL HIGH (ref 35.0–45.0)
pCO2 arterial: 40.7 mmHg (ref 35.0–45.0)
pCO2 arterial: 37.7 mmHg (ref 35.0–45.0)
pCO2 arterial: 39.9 mmHg (ref 35.0–45.0)
pCO2 arterial: 34 mmHg — ABNORMAL LOW (ref 35.0–45.0)
pH, Arterial: 7.436 (ref 7.350–7.450)
pH, Arterial: 7.384 (ref 7.350–7.450)
pH, Arterial: 7.37 (ref 7.350–7.450)
pH, Arterial: 7.389 (ref 7.350–7.450)
pH, Arterial: 7.352 (ref 7.350–7.450)
pH, Arterial: 7.349 — ABNORMAL LOW (ref 7.350–7.450)
pO2, Arterial: 365 mmHg — ABNORMAL HIGH (ref 80.0–100.0)
pO2, Arterial: 486 mmHg — ABNORMAL HIGH (ref 80.0–100.0)
pO2, Arterial: 232 mmHg — ABNORMAL HIGH (ref 80.0–100.0)
pO2, Arterial: 71 mmHg — ABNORMAL LOW (ref 80.0–100.0)
pO2, Arterial: 105 mmHg — ABNORMAL HIGH (ref 80.0–100.0)
pO2, Arterial: 95 mmHg (ref 80.0–100.0)

## 2015-04-03 LAB — BASIC METABOLIC PANEL
Anion gap: 9 (ref 5–15)
BUN: 51 mg/dL — ABNORMAL HIGH (ref 6–20)
CO2: 22 mmol/L (ref 22–32)
Calcium: 7 mg/dL — ABNORMAL LOW (ref 8.9–10.3)
Chloride: 103 mmol/L (ref 101–111)
Creatinine, Ser: 8.57 mg/dL — ABNORMAL HIGH (ref 0.61–1.24)
GFR calc Af Amer: 7 mL/min — ABNORMAL LOW (ref >60)
GFR calc non Af Amer: 6 mL/min — ABNORMAL LOW (ref >60)
Glucose, Bld: 137 mg/dL — ABNORMAL HIGH (ref 65–99)
Potassium: 4.4 mmol/L (ref 3.5–5.1)
Sodium: 134 mmol/L — ABNORMAL LOW (ref 135–145)

## 2015-04-03 LAB — POCT I-STAT, CHEM 8
BUN: 46 mg/dL — ABNORMAL HIGH (ref 6–20)
BUN: 49 mg/dL — ABNORMAL HIGH (ref 6–20)
BUN: 45 mg/dL — ABNORMAL HIGH (ref 6–20)
BUN: 44 mg/dL — ABNORMAL HIGH (ref 6–20)
BUN: 47 mg/dL — ABNORMAL HIGH (ref 6–20)
BUN: 47 mg/dL — ABNORMAL HIGH (ref 6–20)
BUN: 53 mg/dL — ABNORMAL HIGH (ref 6–20)
Calcium, Ion: 1.06 mmol/L — ABNORMAL LOW (ref 1.13–1.30)
Calcium, Ion: 1.04 mmol/L — ABNORMAL LOW (ref 1.13–1.30)
Calcium, Ion: 0.93 mmol/L — ABNORMAL LOW (ref 1.13–1.30)
Calcium, Ion: 0.93 mmol/L — ABNORMAL LOW (ref 1.13–1.30)
Calcium, Ion: 0.95 mmol/L — ABNORMAL LOW (ref 1.13–1.30)
Calcium, Ion: 0.98 mmol/L — ABNORMAL LOW (ref 1.13–1.30)
Calcium, Ion: 1.08 mmol/L — ABNORMAL LOW (ref 1.13–1.30)
Chloride: 95 mmol/L — ABNORMAL LOW (ref 101–111)
Chloride: 99 mmol/L — ABNORMAL LOW (ref 101–111)
Chloride: 101 mmol/L (ref 101–111)
Chloride: 102 mmol/L (ref 101–111)
Chloride: 100 mmol/L — ABNORMAL LOW (ref 101–111)
Chloride: 99 mmol/L — ABNORMAL LOW (ref 101–111)
Chloride: 102 mmol/L (ref 101–111)
Creatinine, Ser: 9.7 mg/dL — ABNORMAL HIGH (ref 0.61–1.24)
Creatinine, Ser: 8.9 mg/dL — ABNORMAL HIGH (ref 0.61–1.24)
Creatinine, Ser: 7.5 mg/dL — ABNORMAL HIGH (ref 0.61–1.24)
Creatinine, Ser: 9 mg/dL — ABNORMAL HIGH (ref 0.61–1.24)
Creatinine, Ser: 8.3 mg/dL — ABNORMAL HIGH (ref 0.61–1.24)
Creatinine, Ser: 8.4 mg/dL — ABNORMAL HIGH (ref 0.61–1.24)
Creatinine, Ser: 9.3 mg/dL — ABNORMAL HIGH (ref 0.61–1.24)
Glucose, Bld: 132 mg/dL — ABNORMAL HIGH (ref 65–99)
Glucose, Bld: 109 mg/dL — ABNORMAL HIGH (ref 65–99)
Glucose, Bld: 124 mg/dL — ABNORMAL HIGH (ref 65–99)
Glucose, Bld: 97 mg/dL (ref 65–99)
Glucose, Bld: 103 mg/dL — ABNORMAL HIGH (ref 65–99)
Glucose, Bld: 127 mg/dL — ABNORMAL HIGH (ref 65–99)
Glucose, Bld: 132 mg/dL — ABNORMAL HIGH (ref 65–99)
HCT: 29 % — ABNORMAL LOW (ref 39.0–52.0)
HCT: 22 % — ABNORMAL LOW (ref 39.0–52.0)
HCT: 24 % — ABNORMAL LOW (ref 39.0–52.0)
HCT: 23 % — ABNORMAL LOW (ref 39.0–52.0)
HCT: 20 % — ABNORMAL LOW (ref 39.0–52.0)
HCT: 23 % — ABNORMAL LOW (ref 39.0–52.0)
HCT: 25 % — ABNORMAL LOW (ref 39.0–52.0)
Hemoglobin: 9.9 g/dL — ABNORMAL LOW (ref 13.0–17.0)
Hemoglobin: 7.5 g/dL — ABNORMAL LOW (ref 13.0–17.0)
Hemoglobin: 8.2 g/dL — ABNORMAL LOW (ref 13.0–17.0)
Hemoglobin: 7.8 g/dL — ABNORMAL LOW (ref 13.0–17.0)
Hemoglobin: 6.8 g/dL — CL (ref 13.0–17.0)
Hemoglobin: 7.8 g/dL — ABNORMAL LOW (ref 13.0–17.0)
Hemoglobin: 8.5 g/dL — ABNORMAL LOW (ref 13.0–17.0)
Potassium: 4.4 mmol/L (ref 3.5–5.1)
Potassium: 4.2 mmol/L (ref 3.5–5.1)
Potassium: 4.6 mmol/L (ref 3.5–5.1)
Potassium: 4.2 mmol/L (ref 3.5–5.1)
Potassium: 4.5 mmol/L (ref 3.5–5.1)
Potassium: 5 mmol/L (ref 3.5–5.1)
Potassium: 5.3 mmol/L — ABNORMAL HIGH (ref 3.5–5.1)
Sodium: 133 mmol/L — ABNORMAL LOW (ref 135–145)
Sodium: 132 mmol/L — ABNORMAL LOW (ref 135–145)
Sodium: 136 mmol/L (ref 135–145)
Sodium: 131 mmol/L — ABNORMAL LOW (ref 135–145)
Sodium: 132 mmol/L — ABNORMAL LOW (ref 135–145)
Sodium: 133 mmol/L — ABNORMAL LOW (ref 135–145)
Sodium: 133 mmol/L — ABNORMAL LOW (ref 135–145)
TCO2: 28 mmol/L (ref 0–100)
TCO2: 26 mmol/L (ref 0–100)
TCO2: 26 mmol/L (ref 0–100)
TCO2: 27 mmol/L (ref 0–100)
TCO2: 22 mmol/L (ref 0–100)
TCO2: 23 mmol/L (ref 0–100)
TCO2: 20 mmol/L (ref 0–100)

## 2015-04-03 LAB — URINE MICROSCOPIC-ADD ON

## 2015-04-03 LAB — CBC
HCT: 27.2 % — ABNORMAL LOW (ref 39.0–52.0)
HCT: 27 % — ABNORMAL LOW (ref 39.0–52.0)
Hemoglobin: 9.4 g/dL — ABNORMAL LOW (ref 13.0–17.0)
Hemoglobin: 9.2 g/dL — ABNORMAL LOW (ref 13.0–17.0)
MCH: 31.2 pg (ref 26.0–34.0)
MCH: 30.8 pg (ref 26.0–34.0)
MCHC: 34.6 g/dL (ref 30.0–36.0)
MCHC: 34.1 g/dL (ref 30.0–36.0)
MCV: 90.4 fL (ref 78.0–100.0)
MCV: 90.3 fL (ref 78.0–100.0)
Platelets: 169 10*3/uL (ref 150–400)
Platelets: 223 10*3/uL (ref 150–400)
RBC: 3.01 MIL/uL — ABNORMAL LOW (ref 4.22–5.81)
RBC: 2.99 MIL/uL — ABNORMAL LOW (ref 4.22–5.81)
RDW: 15.4 % (ref 11.5–15.5)
RDW: 15.8 % — ABNORMAL HIGH (ref 11.5–15.5)
WBC: 10 10*3/uL (ref 4.0–10.5)
WBC: 16.3 10*3/uL — ABNORMAL HIGH (ref 4.0–10.5)

## 2015-04-03 LAB — URINALYSIS, ROUTINE W REFLEX MICROSCOPIC
Bilirubin Urine: NEGATIVE
Glucose, UA: 250 mg/dL — AB
Ketones, ur: NEGATIVE mg/dL
Nitrite: NEGATIVE
Protein, ur: >300 mg/dL — AB
Specific Gravity, Urine: 1.021 (ref 1.005–1.030)
Urobilinogen, UA: 0.2 mg/dL (ref 0.0–1.0)
pH: 6.5 (ref 5.0–8.0)

## 2015-04-03 LAB — COMPREHENSIVE METABOLIC PANEL
ALT: 14 U/L — ABNORMAL LOW (ref 17–63)
AST: 15 U/L (ref 15–41)
Albumin: 2.9 g/dL — ABNORMAL LOW (ref 3.5–5.0)
Alkaline Phosphatase: 42 U/L (ref 38–126)
Anion gap: 13 (ref 5–15)
BUN: 53 mg/dL — ABNORMAL HIGH (ref 6–20)
CO2: 26 mmol/L (ref 22–32)
Calcium: 8.3 mg/dL — ABNORMAL LOW (ref 8.9–10.3)
Chloride: 94 mmol/L — ABNORMAL LOW (ref 101–111)
Creatinine, Ser: 9.15 mg/dL — ABNORMAL HIGH (ref 0.61–1.24)
GFR calc Af Amer: 6 mL/min — ABNORMAL LOW (ref >60)
GFR calc non Af Amer: 5 mL/min — ABNORMAL LOW (ref >60)
Glucose, Bld: 157 mg/dL — ABNORMAL HIGH (ref 65–99)
Potassium: 4.2 mmol/L (ref 3.5–5.1)
Sodium: 133 mmol/L — ABNORMAL LOW (ref 135–145)
Total Bilirubin: 0.4 mg/dL (ref 0.3–1.2)
Total Protein: 5.9 g/dL — ABNORMAL LOW (ref 6.5–8.1)

## 2015-04-03 LAB — POCT I-STAT 4, (NA,K, GLUC, HGB,HCT)
Glucose, Bld: 170 mg/dL — ABNORMAL HIGH (ref 65–99)
HCT: 29 % — ABNORMAL LOW (ref 39.0–52.0)
Hemoglobin: 9.9 g/dL — ABNORMAL LOW (ref 13.0–17.0)
Potassium: 4.4 mmol/L (ref 3.5–5.1)
Sodium: 134 mmol/L — ABNORMAL LOW (ref 135–145)

## 2015-04-03 LAB — HEMOGLOBIN A1C
Hgb A1c MFr Bld: 7.3 % — ABNORMAL HIGH (ref 4.8–5.6)
Mean Plasma Glucose: 163 mg/dL

## 2015-04-03 LAB — HEPATITIS B CORE ANTIBODY, TOTAL: Hep B Core Total Ab: NEGATIVE

## 2015-04-03 LAB — SURGICAL PCR SCREEN
MRSA, PCR: NEGATIVE
Staphylococcus aureus: NEGATIVE

## 2015-04-03 LAB — PROTIME-INR
INR: 1.43 (ref 0.00–1.49)
Prothrombin Time: 17.5 seconds — ABNORMAL HIGH (ref 11.6–15.2)

## 2015-04-03 LAB — CREATININE, SERUM
Creatinine, Ser: 9.28 mg/dL — ABNORMAL HIGH (ref 0.61–1.24)
GFR calc Af Amer: 6 mL/min — ABNORMAL LOW (ref >60)
GFR calc non Af Amer: 5 mL/min — ABNORMAL LOW (ref >60)

## 2015-04-03 LAB — HEPARIN LEVEL (UNFRACTIONATED): Heparin Unfractionated: 0.45 IU/mL (ref 0.30–0.70)

## 2015-04-03 LAB — PREPARE RBC (CROSSMATCH)

## 2015-04-03 LAB — GLUCOSE, CAPILLARY
Glucose-Capillary: 179 mg/dL — ABNORMAL HIGH (ref 65–99)
Glucose-Capillary: 141 mg/dL — ABNORMAL HIGH (ref 65–99)
Glucose-Capillary: 124 mg/dL — ABNORMAL HIGH (ref 65–99)
Glucose-Capillary: 94 mg/dL (ref 65–99)
Glucose-Capillary: 148 mg/dL — ABNORMAL HIGH (ref 65–99)
Glucose-Capillary: 129 mg/dL — ABNORMAL HIGH (ref 65–99)

## 2015-04-03 LAB — APTT: aPTT: 34 seconds (ref 24–37)

## 2015-04-03 LAB — HEPATITIS B SURFACE ANTIBODY,QUALITATIVE: Hep B S Ab: REACTIVE

## 2015-04-03 LAB — PLATELET COUNT: Platelets: 147 10*3/uL — ABNORMAL LOW (ref 150–400)

## 2015-04-03 LAB — HEMOGLOBIN AND HEMATOCRIT, BLOOD
HCT: 23 % — ABNORMAL LOW (ref 39.0–52.0)
Hemoglobin: 8.1 g/dL — ABNORMAL LOW (ref 13.0–17.0)

## 2015-04-03 LAB — MAGNESIUM
Magnesium: 2.6 mg/dL — ABNORMAL HIGH (ref 1.7–2.4)
Magnesium: 3.1 mg/dL — ABNORMAL HIGH (ref 1.7–2.4)

## 2015-04-03 LAB — HEPATITIS B SURFACE ANTIGEN: Hepatitis B Surface Ag: NEGATIVE

## 2015-04-03 SURGERY — CORONARY ARTERY BYPASS GRAFTING (CABG)
Anesthesia: General | Site: Chest

## 2015-04-03 MED ORDER — PROTAMINE SULFATE 10 MG/ML IV SOLN
INTRAVENOUS | Status: AC
  Filled 2015-04-03: qty 25

## 2015-04-03 MED ORDER — PLASMA-LYTE 148 IV SOLN
INTRAVENOUS | Status: AC
  Administered 2015-04-03: 10:00:00
  Filled 2015-04-03: qty 2.5

## 2015-04-03 MED ORDER — MAGNESIUM SULFATE 4 GM/100ML IV SOLN
4.0000 g | Freq: Once | INTRAVENOUS | Status: AC
  Administered 2015-04-03: 4 g via INTRAVENOUS
  Filled 2015-04-03: qty 100

## 2015-04-03 MED ORDER — EPHEDRINE SULFATE 50 MG/ML IJ SOLN
INTRAMUSCULAR | Status: AC
  Filled 2015-04-03: qty 1

## 2015-04-03 MED ORDER — SODIUM CHLORIDE 0.9 % IJ SOLN: 3.0000 mL | INTRAMUSCULAR | Status: DC | PRN

## 2015-04-03 MED ORDER — MORPHINE SULFATE (PF) 2 MG/ML IV SOLN
2.0000 mg | INTRAVENOUS | Status: DC | PRN
  Administered 2015-04-03 – 2015-04-04 (×6): 2 mg via INTRAVENOUS
  Administered 2015-04-05 (×2): 4 mg via INTRAVENOUS
  Administered 2015-04-05: 2 mg via INTRAVENOUS
  Filled 2015-04-03: qty 1
  Filled 2015-04-03: qty 2
  Filled 2015-04-03 (×2): qty 1
  Filled 2015-04-03: qty 2
  Filled 2015-04-03 (×5): qty 1

## 2015-04-03 MED ORDER — CEFUROXIME SODIUM 1.5 G IJ SOLR
1.5000 g | Freq: Two times a day (BID) | INTRAMUSCULAR | Status: DC
  Filled 2015-04-03: qty 1.5

## 2015-04-03 MED ORDER — SODIUM CHLORIDE 0.9 % IJ SOLN
10.0000 mL | Freq: Two times a day (BID) | INTRAMUSCULAR | Status: DC
  Administered 2015-04-03 – 2015-04-07 (×8): 10 mL via INTRAVENOUS

## 2015-04-03 MED ORDER — PROPOFOL 10 MG/ML IV BOLUS
INTRAVENOUS | Status: AC
  Filled 2015-04-03: qty 20

## 2015-04-03 MED ORDER — CISATRACURIUM BESYLATE 20 MG/10ML IV SOLN
INTRAVENOUS | Status: AC
  Filled 2015-04-03: qty 10

## 2015-04-03 MED ORDER — FENTANYL CITRATE (PF) 250 MCG/5ML IJ SOLN
INTRAMUSCULAR | Status: AC
  Filled 2015-04-03: qty 5

## 2015-04-03 MED ORDER — PROTAMINE SULFATE 10 MG/ML IV SOLN
INTRAVENOUS | Status: DC | PRN
  Administered 2015-04-03: 30 mg via INTRAVENOUS

## 2015-04-03 MED ORDER — SODIUM CHLORIDE 0.9 % IV SOLN
INTRAVENOUS | Status: DC | PRN
  Administered 2015-04-03: 14:00:00 via INTRAVENOUS

## 2015-04-03 MED ORDER — ANTISEPTIC ORAL RINSE SOLUTION (CORINZ)
7.0000 mL | Freq: Four times a day (QID) | OROMUCOSAL | Status: DC
  Administered 2015-04-03 – 2015-04-05 (×4): 7 mL via OROMUCOSAL

## 2015-04-03 MED ORDER — ONDANSETRON HCL 4 MG/2ML IJ SOLN
4.0000 mg | Freq: Four times a day (QID) | INTRAMUSCULAR | Status: DC | PRN
  Filled 2015-04-03: qty 2

## 2015-04-03 MED ORDER — BISACODYL 10 MG RE SUPP: 10.0000 mg | Freq: Every day | RECTAL | Status: DC

## 2015-04-03 MED ORDER — OXYCODONE HCL 5 MG PO TABS
5.0000 mg | ORAL_TABLET | ORAL | Status: DC | PRN
  Administered 2015-04-04 – 2015-04-05 (×4): 10 mg via ORAL
  Administered 2015-04-08 – 2015-04-09 (×2): 5 mg via ORAL
  Filled 2015-04-03 (×5): qty 2
  Filled 2015-04-03: qty 1

## 2015-04-03 MED ORDER — ACETAMINOPHEN 160 MG/5ML PO SOLN: 650.0000 mg | Freq: Once | ORAL | Status: AC

## 2015-04-03 MED ORDER — METOPROLOL TARTRATE 25 MG/10 ML ORAL SUSPENSION
12.5000 mg | Freq: Two times a day (BID) | ORAL | Status: DC
  Filled 2015-04-03 (×9): qty 5

## 2015-04-03 MED ORDER — CHLORHEXIDINE GLUCONATE 0.12 % MT SOLN
15.0000 mL | Freq: Two times a day (BID) | OROMUCOSAL | Status: DC
  Administered 2015-04-03 – 2015-04-05 (×4): 15 mL via OROMUCOSAL
  Filled 2015-04-03 (×2): qty 15

## 2015-04-03 MED ORDER — LACTATED RINGERS IV SOLN: INTRAVENOUS | Status: DC

## 2015-04-03 MED ORDER — HEMOSTATIC AGENTS (NO CHARGE) OPTIME
TOPICAL | Status: DC | PRN
  Administered 2015-04-03: 1 via TOPICAL

## 2015-04-03 MED ORDER — CISATRACURIUM BESYLATE (PF) 10 MG/5ML IV SOLN
INTRAVENOUS | Status: DC | PRN
  Administered 2015-04-03 (×4): 10 mg via INTRAVENOUS
  Administered 2015-04-03: 6 mg via INTRAVENOUS
  Administered 2015-04-03: 10 mg via INTRAVENOUS
  Administered 2015-04-03: 4 mg via INTRAVENOUS

## 2015-04-03 MED ORDER — INSULIN REGULAR BOLUS VIA INFUSION
0.0000 [IU] | Freq: Three times a day (TID) | INTRAVENOUS | Status: DC
  Filled 2015-04-03: qty 10

## 2015-04-03 MED ORDER — SODIUM CHLORIDE 0.45 % IV SOLN
INTRAVENOUS | Status: DC | PRN
  Administered 2015-04-03: 14:00:00 via INTRAVENOUS

## 2015-04-03 MED ORDER — NITROGLYCERIN IN D5W 200-5 MCG/ML-% IV SOLN
0.0000 ug/min | INTRAVENOUS | Status: DC
  Administered 2015-04-04: 50 ug/min via INTRAVENOUS
  Filled 2015-04-03: qty 250

## 2015-04-03 MED ORDER — BISACODYL 5 MG PO TBEC
10.0000 mg | DELAYED_RELEASE_TABLET | Freq: Every day | ORAL | Status: DC
  Administered 2015-04-05 – 2015-04-10 (×5): 10 mg via ORAL
  Filled 2015-04-03 (×5): qty 2

## 2015-04-03 MED ORDER — ARTIFICIAL TEARS OP OINT
TOPICAL_OINTMENT | OPHTHALMIC | Status: DC | PRN
  Administered 2015-04-03: 1 via OPHTHALMIC

## 2015-04-03 MED ORDER — ALBUMIN HUMAN 5 % IV SOLN
250.0000 mL | INTRAVENOUS | Status: AC | PRN
  Administered 2015-04-03: 250 mL via INTRAVENOUS

## 2015-04-03 MED ORDER — MIDAZOLAM HCL 2 MG/2ML IJ SOLN
2.0000 mg | INTRAMUSCULAR | Status: DC | PRN
  Administered 2015-04-04: 2 mg via INTRAVENOUS
  Filled 2015-04-03: qty 2

## 2015-04-03 MED ORDER — MILRINONE IN DEXTROSE 20 MG/100ML IV SOLN
0.3000 ug/kg/min | INTRAVENOUS | Status: DC
  Administered 2015-04-03 – 2015-04-04 (×2): 0.25 ug/kg/min via INTRAVENOUS
  Filled 2015-04-03 (×2): qty 100

## 2015-04-03 MED ORDER — ASPIRIN EC 325 MG PO TBEC
325.0000 mg | DELAYED_RELEASE_TABLET | Freq: Every day | ORAL | Status: DC
  Administered 2015-04-05 – 2015-04-10 (×3): 325 mg via ORAL
  Filled 2015-04-03 (×6): qty 1

## 2015-04-03 MED ORDER — SODIUM CHLORIDE 0.9 % IV SOLN
125.0000 mg | INTRAVENOUS | Status: DC
  Administered 2015-04-05: 125 mg via INTRAVENOUS
  Filled 2015-04-03 (×2): qty 10

## 2015-04-03 MED ORDER — MIDAZOLAM HCL 5 MG/5ML IJ SOLN
INTRAMUSCULAR | Status: DC | PRN
  Administered 2015-04-03: 1 mg via INTRAVENOUS
  Administered 2015-04-03: 4 mg via INTRAVENOUS
  Administered 2015-04-03 (×2): 1 mg via INTRAVENOUS
  Administered 2015-04-03: 3 mg via INTRAVENOUS

## 2015-04-03 MED ORDER — PHENYLEPHRINE 40 MCG/ML (10ML) SYRINGE FOR IV PUSH (FOR BLOOD PRESSURE SUPPORT)
PREFILLED_SYRINGE | INTRAVENOUS | Status: AC
  Filled 2015-04-03: qty 10

## 2015-04-03 MED ORDER — SODIUM CHLORIDE 0.9 % IV SOLN: 250.0000 mL | INTRAVENOUS | Status: DC

## 2015-04-03 MED ORDER — SUCCINYLCHOLINE CHLORIDE 20 MG/ML IJ SOLN
INTRAMUSCULAR | Status: AC
  Filled 2015-04-03: qty 1

## 2015-04-03 MED ORDER — ACETAMINOPHEN 160 MG/5ML PO SOLN
1000.0000 mg | Freq: Four times a day (QID) | ORAL | Status: AC
  Filled 2015-04-03: qty 40

## 2015-04-03 MED ORDER — SODIUM CHLORIDE 0.9 % IJ SOLN
3.0000 mL | Freq: Two times a day (BID) | INTRAMUSCULAR | Status: DC
  Administered 2015-04-04 – 2015-04-06 (×5): 3 mL via INTRAVENOUS

## 2015-04-03 MED ORDER — METOPROLOL TARTRATE 1 MG/ML IV SOLN
2.5000 mg | INTRAVENOUS | Status: DC | PRN
  Administered 2015-04-09: 5 mg via INTRAVENOUS
  Filled 2015-04-03 (×2): qty 5

## 2015-04-03 MED ORDER — SODIUM CHLORIDE 0.9 % IV SOLN
INTRAVENOUS | Status: DC | PRN
  Administered 2015-04-03: 07:00:00 via INTRAVENOUS

## 2015-04-03 MED ORDER — MORPHINE SULFATE (PF) 2 MG/ML IV SOLN
1.0000 mg | INTRAVENOUS | Status: DC | PRN
  Administered 2015-04-03: 2 mg via INTRAVENOUS

## 2015-04-03 MED ORDER — VANCOMYCIN HCL IN DEXTROSE 1-5 GM/200ML-% IV SOLN
1000.0000 mg | Freq: Once | INTRAVENOUS | Status: DC
  Filled 2015-04-03: qty 200

## 2015-04-03 MED ORDER — TRAMADOL HCL 50 MG PO TABS
50.0000 mg | ORAL_TABLET | ORAL | Status: DC | PRN
  Administered 2015-04-09: 100 mg via ORAL
  Administered 2015-04-10: 50 mg via ORAL
  Filled 2015-04-03: qty 2
  Filled 2015-04-03: qty 1

## 2015-04-03 MED ORDER — ROCURONIUM BROMIDE 50 MG/5ML IV SOLN
INTRAVENOUS | Status: AC
  Filled 2015-04-03: qty 2

## 2015-04-03 MED ORDER — MIDAZOLAM HCL 10 MG/2ML IJ SOLN
INTRAMUSCULAR | Status: AC
  Filled 2015-04-03: qty 4

## 2015-04-03 MED ORDER — PANTOPRAZOLE SODIUM 40 MG PO TBEC
40.0000 mg | DELAYED_RELEASE_TABLET | Freq: Every day | ORAL | Status: DC
  Administered 2015-04-05 – 2015-04-10 (×5): 40 mg via ORAL
  Filled 2015-04-03 (×5): qty 1

## 2015-04-03 MED ORDER — ACETAMINOPHEN 650 MG RE SUPP
650.0000 mg | Freq: Once | RECTAL | Status: AC
  Administered 2015-04-03: 650 mg via RECTAL

## 2015-04-03 MED ORDER — PROPOFOL 10 MG/ML IV BOLUS
INTRAVENOUS | Status: DC | PRN
  Administered 2015-04-03 (×2): 30 mg via INTRAVENOUS
  Administered 2015-04-03: 70 mg via INTRAVENOUS
  Administered 2015-04-03: 50 mg via INTRAVENOUS
  Administered 2015-04-03: 40 mg via INTRAVENOUS
  Administered 2015-04-03: 20 mg via INTRAVENOUS
  Administered 2015-04-03: 40 mg via INTRAVENOUS
  Administered 2015-04-03: 30 mg via INTRAVENOUS

## 2015-04-03 MED ORDER — FAMOTIDINE IN NACL 20-0.9 MG/50ML-% IV SOLN
20.0000 mg | Freq: Two times a day (BID) | INTRAVENOUS | Status: AC
  Administered 2015-04-03 (×2): 20 mg via INTRAVENOUS
  Filled 2015-04-03: qty 50

## 2015-04-03 MED ORDER — PHENYLEPHRINE HCL 10 MG/ML IJ SOLN
10.0000 mg | INTRAVENOUS | Status: DC | PRN
  Administered 2015-04-03: 15 ug/min via INTRAVENOUS

## 2015-04-03 MED ORDER — POTASSIUM CHLORIDE 10 MEQ/50ML IV SOLN: 10.0000 meq | INTRAVENOUS | Status: AC

## 2015-04-03 MED ORDER — FENTANYL CITRATE (PF) 250 MCG/5ML IJ SOLN
INTRAMUSCULAR | Status: DC | PRN
  Administered 2015-04-03 (×3): 50 ug via INTRAVENOUS
  Administered 2015-04-03 (×3): 100 ug via INTRAVENOUS
  Administered 2015-04-03 (×4): 50 ug via INTRAVENOUS
  Administered 2015-04-03: 1050 ug via INTRAVENOUS
  Administered 2015-04-03: 50 ug via INTRAVENOUS

## 2015-04-03 MED ORDER — SODIUM CHLORIDE 0.9 % IV SOLN: INTRAVENOUS | Status: DC

## 2015-04-03 MED ORDER — VANCOMYCIN HCL 500 MG IV SOLR
500.0000 mg | Freq: Once | INTRAVENOUS | Status: AC
  Administered 2015-04-03: 500 mg via INTRAVENOUS
  Filled 2015-04-03: qty 500

## 2015-04-03 MED ORDER — LACTATED RINGERS IV SOLN: 500.0000 mL | Freq: Once | INTRAVENOUS | Status: DC | PRN

## 2015-04-03 MED ORDER — DOPAMINE-DEXTROSE 3.2-5 MG/ML-% IV SOLN
0.0000 ug/kg/min | INTRAVENOUS | Status: DC
  Administered 2015-04-03: 2.5 ug/kg/min via INTRAVENOUS
  Filled 2015-04-03: qty 250

## 2015-04-03 MED ORDER — DEXMEDETOMIDINE HCL IN NACL 200 MCG/50ML IV SOLN
0.0000 ug/kg/h | INTRAVENOUS | Status: DC
  Administered 2015-04-03: 0.5 ug/kg/h via INTRAVENOUS
  Administered 2015-04-04: 0.1 ug/kg/h via INTRAVENOUS
  Filled 2015-04-03 (×2): qty 50

## 2015-04-03 MED ORDER — NITROGLYCERIN 0.2 MG/ML ON CALL CATH LAB
INTRAVENOUS | Status: DC | PRN
  Administered 2015-04-03 (×7): 40 ug via INTRAVENOUS

## 2015-04-03 MED ORDER — SODIUM CHLORIDE 0.9 % IV SOLN
INTRAVENOUS | Status: DC
  Administered 2015-04-03: 2.6 [IU]/h via INTRAVENOUS
  Filled 2015-04-03 (×2): qty 2.5

## 2015-04-03 MED ORDER — DOCUSATE SODIUM 100 MG PO CAPS
200.0000 mg | ORAL_CAPSULE | Freq: Every day | ORAL | Status: DC
  Administered 2015-04-05 – 2015-04-09 (×4): 200 mg via ORAL
  Filled 2015-04-03 (×5): qty 2

## 2015-04-03 MED ORDER — ASPIRIN 81 MG PO CHEW
324.0000 mg | CHEWABLE_TABLET | Freq: Every day | ORAL | Status: DC
  Administered 2015-04-08 – 2015-04-09 (×2): 324 mg
  Filled 2015-04-03 (×2): qty 4

## 2015-04-03 MED ORDER — ACETAMINOPHEN 500 MG PO TABS
1000.0000 mg | ORAL_TABLET | Freq: Four times a day (QID) | ORAL | Status: AC
  Administered 2015-04-04 – 2015-04-08 (×12): 1000 mg via ORAL
  Filled 2015-04-03 (×20): qty 2

## 2015-04-03 MED ORDER — 0.9 % SODIUM CHLORIDE (POUR BTL) OPTIME
TOPICAL | Status: DC | PRN
  Administered 2015-04-03: 1000 mL

## 2015-04-03 MED ORDER — METOPROLOL TARTRATE 12.5 MG HALF TABLET
12.5000 mg | ORAL_TABLET | Freq: Two times a day (BID) | ORAL | Status: DC
  Administered 2015-04-04 – 2015-04-06 (×4): 12.5 mg via ORAL
  Filled 2015-04-03 (×9): qty 1

## 2015-04-03 MED ORDER — HEPARIN SODIUM (PORCINE) 1000 UNIT/ML IJ SOLN
INTRAMUSCULAR | Status: AC
  Filled 2015-04-03: qty 1

## 2015-04-03 MED ORDER — SUCCINYLCHOLINE CHLORIDE 20 MG/ML IJ SOLN
INTRAMUSCULAR | Status: DC | PRN
  Administered 2015-04-03: 140 mg via INTRAVENOUS

## 2015-04-03 MED ORDER — ARTIFICIAL TEARS OP OINT
TOPICAL_OINTMENT | OPHTHALMIC | Status: AC
  Filled 2015-04-03: qty 3.5

## 2015-04-03 MED ORDER — DEXTROSE 5 % IV SOLN
1.5000 g | INTRAVENOUS | Status: AC
  Administered 2015-04-04: 1.5 g via INTRAVENOUS
  Filled 2015-04-03 (×2): qty 1.5

## 2015-04-03 MED ORDER — HEPARIN SODIUM (PORCINE) 1000 UNIT/ML IJ SOLN
INTRAMUSCULAR | Status: DC | PRN
  Administered 2015-04-03: 2000 [IU] via INTRAVENOUS
  Administered 2015-04-03: 3000 [IU] via INTRAVENOUS
  Administered 2015-04-03: 48000 [IU] via INTRAVENOUS

## 2015-04-03 MED ORDER — MILRINONE IN DEXTROSE 20 MG/100ML IV SOLN
0.2500 ug/kg/min | INTRAVENOUS | Status: AC
  Administered 2015-04-03: .3 ug/kg/min via INTRAVENOUS
  Filled 2015-04-03: qty 100

## 2015-04-03 MED ORDER — DEXTROSE 5 % IV SOLN
0.0000 ug/min | INTRAVENOUS | Status: DC
  Filled 2015-04-03: qty 2

## 2015-04-03 MED FILL — Sodium Bicarbonate IV Soln 8.4%: INTRAVENOUS | Qty: 50 | Status: AC

## 2015-04-03 MED FILL — Mannitol IV Soln 20%: INTRAVENOUS | Qty: 500 | Status: AC

## 2015-04-03 MED FILL — Heparin Sodium (Porcine) Inj 1000 Unit/ML: INTRAMUSCULAR | Qty: 30 | Status: AC

## 2015-04-03 MED FILL — Lidocaine HCl IV Inj 20 MG/ML: INTRAVENOUS | Qty: 5 | Status: AC

## 2015-04-03 MED FILL — Magnesium Sulfate Inj 50%: INTRAMUSCULAR | Qty: 10 | Status: AC

## 2015-04-03 MED FILL — Heparin Sodium (Porcine) Inj 1000 Unit/ML: INTRAMUSCULAR | Qty: 10 | Status: AC

## 2015-04-03 MED FILL — Sodium Chloride IV Soln 0.9%: INTRAVENOUS | Qty: 7000 | Status: AC

## 2015-04-03 MED FILL — Potassium Chloride Inj 2 mEq/ML: INTRAVENOUS | Qty: 40 | Status: AC

## 2015-04-03 SURGICAL SUPPLY — 112 items
ADAPTER CARDIO PERF ANTE/RETRO (ADAPTER) ×4 IMPLANT
ADPR PRFSN 84XANTGRD RTRGD (ADAPTER) ×2
APL SKNCLS STERI-STRIP NONHPOA (GAUZE/BANDAGES/DRESSINGS) ×2
BAG DECANTER FOR FLEXI CONT (MISCELLANEOUS) ×4 IMPLANT
BANDAGE ELASTIC 4 VELCRO ST LF (GAUZE/BANDAGES/DRESSINGS) ×4 IMPLANT
BANDAGE ELASTIC 6 VELCRO ST LF (GAUZE/BANDAGES/DRESSINGS) ×4 IMPLANT
BASKET HEART  (ORDER IN 25'S) (MISCELLANEOUS) ×1
BASKET HEART (ORDER IN 25'S) (MISCELLANEOUS) ×1
BASKET HEART (ORDER IN 25S) (MISCELLANEOUS) ×2 IMPLANT
BENZOIN TINCTURE PRP APPL 2/3 (GAUZE/BANDAGES/DRESSINGS) ×2 IMPLANT
BLADE STERNUM SYSTEM 6 (BLADE) ×4 IMPLANT
BLADE SURG 11 STRL SS (BLADE) ×2 IMPLANT
BLADE SURG 12 STRL SS (BLADE) ×4 IMPLANT
BLADE SURG ROTATE 9660 (MISCELLANEOUS) IMPLANT
BLOOD HAEMOCONCENTR 700 MIDI (MISCELLANEOUS) ×2 IMPLANT
BNDG GAUZE ELAST 4 BULKY (GAUZE/BANDAGES/DRESSINGS) ×4 IMPLANT
CANISTER SUCTION 2500CC (MISCELLANEOUS) ×4 IMPLANT
CANNULA GUNDRY RCSP 15FR (MISCELLANEOUS) ×4 IMPLANT
CATH CPB KIT VANTRIGT (MISCELLANEOUS) ×4 IMPLANT
CATH ROBINSON RED A/P 18FR (CATHETERS) ×12 IMPLANT
CATH THORACIC 36FR RT ANG (CATHETERS) ×4 IMPLANT
CLIP FOGARTY SPRING 6M (CLIP) ×2 IMPLANT
CLIP TI WIDE RED SMALL 24 (CLIP) ×4 IMPLANT
COVER SURGICAL LIGHT HANDLE (MISCELLANEOUS) ×4 IMPLANT
CRADLE DONUT ADULT HEAD (MISCELLANEOUS) ×4 IMPLANT
DRAIN CHANNEL 32F RND 10.7 FF (WOUND CARE) ×4 IMPLANT
DRAPE CARDIOVASCULAR INCISE (DRAPES) ×4
DRAPE SLUSH/WARMER DISC (DRAPES) ×4 IMPLANT
DRAPE SRG 135X102X78XABS (DRAPES) ×2 IMPLANT
DRSG AQUACEL AG ADV 3.5X14 (GAUZE/BANDAGES/DRESSINGS) ×4 IMPLANT
ELECT BLADE 4.0 EZ CLEAN MEGAD (MISCELLANEOUS) ×4
ELECT BLADE 6.5 EXT (BLADE) ×4 IMPLANT
ELECT CAUTERY BLADE 6.4 (BLADE) ×4 IMPLANT
ELECT REM PT RETURN 9FT ADLT (ELECTROSURGICAL) ×8
ELECTRODE BLDE 4.0 EZ CLN MEGD (MISCELLANEOUS) ×2 IMPLANT
ELECTRODE REM PT RTRN 9FT ADLT (ELECTROSURGICAL) ×4 IMPLANT
GAUZE SPONGE 4X4 12PLY STRL (GAUZE/BANDAGES/DRESSINGS) ×8 IMPLANT
GLOVE BIO SURGEON STRL SZ 6 (GLOVE) ×2 IMPLANT
GLOVE BIO SURGEON STRL SZ7 (GLOVE) ×4 IMPLANT
GLOVE BIO SURGEON STRL SZ7.5 (GLOVE) ×12 IMPLANT
GLOVE BIOGEL PI IND STRL 7.0 (GLOVE) IMPLANT
GLOVE BIOGEL PI INDICATOR 7.0 (GLOVE) ×4
GOWN STRL REUS W/ TWL LRG LVL3 (GOWN DISPOSABLE) ×8 IMPLANT
GOWN STRL REUS W/TWL LRG LVL3 (GOWN DISPOSABLE) ×24
HEMOSTAT POWDER SURGIFOAM 1G (HEMOSTASIS) ×12 IMPLANT
HEMOSTAT SURGICEL 2X14 (HEMOSTASIS) ×4 IMPLANT
INSERT FOGARTY XLG (MISCELLANEOUS) IMPLANT
KIT BASIN OR (CUSTOM PROCEDURE TRAY) ×4 IMPLANT
KIT CATH LUMEN 3 7FR 30CM STRL (SET/KITS/TRAYS/PACK) ×2 IMPLANT
KIT ROOM TURNOVER OR (KITS) ×4 IMPLANT
KIT SUCTION CATH 14FR (SUCTIONS) ×6 IMPLANT
KIT VASOVIEW W/TROCAR VH 2000 (KITS) ×4 IMPLANT
LEAD PACING MYOCARDI (MISCELLANEOUS) ×4 IMPLANT
LIQUID BAND (GAUZE/BANDAGES/DRESSINGS) ×2 IMPLANT
MARKER GRAFT CORONARY BYPASS (MISCELLANEOUS) ×12 IMPLANT
NS IRRIG 1000ML POUR BTL (IV SOLUTION) ×30 IMPLANT
PACK OPEN HEART (CUSTOM PROCEDURE TRAY) ×4 IMPLANT
PAD ARMBOARD 7.5X6 YLW CONV (MISCELLANEOUS) ×8 IMPLANT
PAD ELECT DEFIB RADIOL ZOLL (MISCELLANEOUS) ×4 IMPLANT
PENCIL BUTTON HOLSTER BLD 10FT (ELECTRODE) ×4 IMPLANT
PUNCH AORTIC ROTATE 4.0MM (MISCELLANEOUS) IMPLANT
PUNCH AORTIC ROTATE 4.5MM 8IN (MISCELLANEOUS) IMPLANT
PUNCH AORTIC ROTATE 5MM 8IN (MISCELLANEOUS) IMPLANT
SET CARDIOPLEGIA MPS 5001102 (MISCELLANEOUS) ×2 IMPLANT
SPONGE GAUZE 4X4 12PLY STER LF (GAUZE/BANDAGES/DRESSINGS) ×4 IMPLANT
SURGIFLO W/THROMBIN 8M KIT (HEMOSTASIS) ×4 IMPLANT
SUT BONE WAX W31G (SUTURE) ×4 IMPLANT
SUT ETHIBOND 2 0 SH (SUTURE) ×16
SUT ETHIBOND 2 0 SH 36X2 (SUTURE) IMPLANT
SUT MNCRL AB 4-0 PS2 18 (SUTURE) ×2 IMPLANT
SUT PROLENE 3 0 SH DA (SUTURE) IMPLANT
SUT PROLENE 3 0 SH1 36 (SUTURE) IMPLANT
SUT PROLENE 4 0 RB 1 (SUTURE) ×8
SUT PROLENE 4 0 SH DA (SUTURE) ×4 IMPLANT
SUT PROLENE 4-0 RB1 .5 CRCL 36 (SUTURE) ×2 IMPLANT
SUT PROLENE 5 0 C 1 36 (SUTURE) ×4 IMPLANT
SUT PROLENE 6 0 C 1 30 (SUTURE) ×14 IMPLANT
SUT PROLENE 6 0 CC (SUTURE) ×12 IMPLANT
SUT PROLENE 7 0 BV 1 (SUTURE) ×4 IMPLANT
SUT PROLENE 7 0 BV1 MDA (SUTURE) ×2 IMPLANT
SUT PROLENE 8 0 BV175 6 (SUTURE) ×6 IMPLANT
SUT PROLENE BLUE 7 0 (SUTURE) ×4 IMPLANT
SUT PROLENE POLY MONO (SUTURE) ×4 IMPLANT
SUT SILK  1 MH (SUTURE) ×4
SUT SILK 1 MH (SUTURE) IMPLANT
SUT SILK 1 TIES 10X30 (SUTURE) ×2 IMPLANT
SUT SILK 2 0 SH CR/8 (SUTURE) ×4 IMPLANT
SUT SILK 2 0 TIES 10X30 (SUTURE) ×2 IMPLANT
SUT SILK 2 0 TIES 17X18 (SUTURE) ×4
SUT SILK 2-0 18XBRD TIE BLK (SUTURE) IMPLANT
SUT SILK 3 0 SH CR/8 (SUTURE) ×2 IMPLANT
SUT SILK 4 0 TIE 10X30 (SUTURE) ×4 IMPLANT
SUT STEEL 6MS V (SUTURE) ×8 IMPLANT
SUT STEEL SZ 6 DBL 3X14 BALL (SUTURE) ×4 IMPLANT
SUT TEM PAC WIRE 2 0 SH (SUTURE) ×4 IMPLANT
SUT VIC AB 1 CTX 36 (SUTURE) ×8
SUT VIC AB 1 CTX36XBRD ANBCTR (SUTURE) ×4 IMPLANT
SUT VIC AB 2-0 CT1 27 (SUTURE)
SUT VIC AB 2-0 CT1 TAPERPNT 27 (SUTURE) IMPLANT
SUT VIC AB 2-0 CTX 27 (SUTURE) ×4 IMPLANT
SUT VIC AB 3-0 SH 27 (SUTURE) ×4
SUT VIC AB 3-0 SH 27X BRD (SUTURE) IMPLANT
SUT VIC AB 3-0 X1 27 (SUTURE) ×2 IMPLANT
SUTURE E-PAK OPEN HEART (SUTURE) ×4 IMPLANT
SYSTEM SAHARA CHEST DRAIN ATS (WOUND CARE) ×4 IMPLANT
TAPE CLOTH SURG 4X10 WHT LF (GAUZE/BANDAGES/DRESSINGS) ×4 IMPLANT
TOWEL OR 17X24 6PK STRL BLUE (TOWEL DISPOSABLE) ×8 IMPLANT
TOWEL OR 17X26 10 PK STRL BLUE (TOWEL DISPOSABLE) ×8 IMPLANT
TRAY FOLEY IC TEMP SENS 16FR (CATHETERS) ×4 IMPLANT
TUBING INSUFFLATION (TUBING) ×4 IMPLANT
UNDERPAD 30X30 INCONTINENT (UNDERPADS AND DIAPERS) ×4 IMPLANT
WATER STERILE IRR 1000ML POUR (IV SOLUTION) ×12 IMPLANT

## 2015-04-03 NOTE — Progress Notes (Signed)
Lab called, CBC pulled with am labs was clotted in lab. Pt scheduled to be picked up for CABG at 0600. Told that phlebotomy will attempt to collect quickly prior to leaving the unit. If not, will attempt lab in OR area

## 2015-04-03 NOTE — Progress Notes (Signed)
Subjective:  Just back from CABG   Objective Vital signs in last 24 hours: Filed Vitals:   04/03/15 1410 04/03/15 1415 04/03/15 1425 04/03/15 1430  BP: 85/40     Pulse: 70  95 95  Temp:  97.9 F (36.6 C) 97.7 F (36.5 C) 97.5 F (36.4 C)  TempSrc:   Core (Comment)   Resp: 20 18 17 16   Height:      Weight:      SpO2: 100%  98% 100%   Weight change: 0.5 kg (1 lb 1.6 oz)  Physical Exam: General: intubated post CABG Heart: RRR no mur , rub or gallop Lungs: intubated on vent CTA Abdomen: NT , soft Extremities:no pedal edema  Dialysis Access: pos bruit L U A AVF     OP HD  MWF 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 Post CABG today.  Get postop K and plan HD next 24-48 hours when needed next.   Kelly Splinter MD [facility] pager [phone]    cell [phone] 04/03/2015, 5:20 PM    Labs: Basic Metabolic Panel:  Recent Labs Lab 03/31/15 0353  04/01/15 1710 04/02/15 0746 04/03/15 0604  04/03/15 1133 04/03/15 1231 04/03/15 1256  NA 135  < > 129* 135 133*  < > 131* 132* 133*  K 4.2  < > 4.6 4.2 4.2  < > 4.2 5.0 4.5  CL 97*  < > 89* 89* 94*  < > 102 100* 99*  CO2 24  < > 24 27 26   --   --   --   --   GLUCOSE 189*  < > 264* 125* 157*  < > 97 103* 127*  BUN 87*  <  > 74* 90* 53*  < > 44* 47* 47*  CREATININE 13.68*  < > 11.73* 13.03* 9.15*  < > 9.00* 8.40* 8.30*  CALCIUM 8.9  < > 9.0 9.3 8.3*  --   --   --   --   PHOS 6.3*  --  6.1* 6.4*  --   --   --   --   --   < > = values in this interval not displayed. Liver Function Tests:  Recent Labs Lab 04/01/15 0711 04/01/15 1710 04/02/15 0746 04/03/15 0604  AST 29  --   --  15  ALT 18  --   --  14*  ALKPHOS 48  --   --  42  BILITOT 0.6  --   --  0.4  PROT 6.8  --   --  5.9*  ALBUMIN 3.2* 3.3* 3.1* 2.9*   No results for input(s): LIPASE, AMYLASE  in the last 168 hours. No results for input(s): AMMONIA in the last 168 hours. CBC:  Recent Labs Lab 03/30/15 2333 03/31/15 0353 04/01/15 0140 04/02/15 0635 04/02/15 0746  04/03/15 1204 04/03/15 1231 04/03/15 1256  WBC 12.8* 11.3* 6.1 8.1 8.4  --   --   --   --   NEUTROABS 10.6*  --   --   --  5.8  --   --   --   --   HGB 10.2* 8.9* 9.0* 8.3* 8.8*  < > 8.1* 6.8* 7.8*  HCT 30.0* 25.5* 26.7* 23.8* 25.3*  < > 23.0* 20.0* 23.0*  MCV 94.9 94.1 93.0 91.2 92.3  --   --   --   --   PLT 233 199 206 221 215  --  147*  --   --   < > = values in this interval not displayed. Cardiac Enzymes:  Recent Labs Lab 03/31/15 1312 03/31/15 1831 04/01/15 0140 04/01/15 0711  TROPONINI 13.87* 14.46* 8.51* 6.25*   CBG:  Recent Labs Lab 04/01/15 1633 04/01/15 2154 04/02/15 1341 04/02/15 1634 04/02/15 2055  GLUCAP 271* 239* 195* 141* 214*    Studies/Results: No results found. Medications: . sodium chloride 20 mL/hr at 04/03/15 1428  . [START ON 04/04/2015] sodium chloride    . sodium chloride    . dexmedetomidine    . insulin (NOVOLIN-R) infusion    . lactated ringers    . lactated ringers    . milrinone    . nitroGLYCERIN    . phenylephrine (NEO-SYNEPHRINE) Adult infusion     . [START ON 04/04/2015] acetaminophen  1,000 mg Oral 4 times per day   Or  . [START ON 04/04/2015] acetaminophen (TYLENOL) oral liquid 160 mg/5 mL  1,000 mg Per Tube 4  times per day  . [START ON 04/04/2015] aspirin EC  325 mg Oral Daily   Or  . [START ON 04/04/2015] aspirin  324 mg Per Tube Daily  . atorvastatin  80 mg Oral q1800  . [START ON 04/04/2015] bisacodyl  10 mg Oral Daily   Or  . [START ON 04/04/2015] bisacodyl  10 mg Rectal Daily  . calcium acetate  667 mg Oral Q breakfast  . cefUROXime (ZINACEF)  IV  1.5 g Intravenous Q12H  . cholecalciferol  1,000 Units Oral Daily  . [START ON 04/04/2015] docusate sodium  200 mg Oral Daily  . famotidine (PEPCID) IV  20 mg Intravenous Q12H  . insulin regular  0-10 Units Intravenous TID WC  . magnesium sulfate  4 g Intravenous Once  . metoprolol tartrate  12.5 mg Oral BID   Or  . metoprolol tartrate  12.5 mg Per Tube BID  . multivitamin with minerals   Oral Daily  . [START ON 04/05/2015] pantoprazole  40 mg Oral Daily  . potassium chloride  10 mEq Intravenous Q1 Hr x 3  . [START ON 04/04/2015] sodium chloride  3 mL Intravenous Q12H  . vancomycin  1,000 mg Intravenous Once

## 2015-04-03 NOTE — Progress Notes (Signed)
Pt transportedto pre=op on tele. Removed Tele after reporting off in pre-op

## 2015-04-03 NOTE — OR Nursing (Signed)
Attempted to call SICU for 45 min call

## 2015-04-03 NOTE — Op Note (Signed)
[name], [name] NO.:  [id number]  MEDICAL RECORD NO.:  [id number]  LOCATION:  2S03C                        FACILITY:  [facility]  PHYSICIAN:  Ivin Poot, M.D.  DATE OF BIRTH:  1950-06-23  DATE OF PROCEDURE:  04/03/2015 DATE OF DISCHARGE:                              OPERATIVE REPORT   OPERATION: 1. Coronary artery bypass grafting x4 (left internal mammary artery to     LAD, saphenous vein graft to first diagonal, saphenous vein graft     to OM1, saphenous vein graft to posterior descending). 2. Endoscopic harvest of right leg greater saphenous vein.  SURGEON:  Ivin Poot, M.D.  ASSISTANT:  Lars Pinks, PA-C.  ANESTHESIA:  General by Dr. Lillia Abed.  PREOPERATIVE DIAGNOSIS: 1. Severe 3-vessel coronary artery disease. 2. Non-ST-elevation myocardial infarction. 3. Chronic renal failure, on hemodialysis.  POSTOPERATIVE DIAGNOSIS: 1. Severe 3-vessel coronary artery disease. 2. Non-ST-elevation myocardial infarction. 3. Chronic renal failure, on hemodialysis.  CLINICAL NOTE:  The patient is a 64 year old African American male with chronic renal failure, on hemodialysis, who presented to an outside hospital with pulmonary edema with positive cardiac enzymes and was transferred to this hospital.  He was treated with emergency dialysis with improvement in pulmonary status.  He underwent urgent cardiac catheterization because of a troponin of 16.5.  This demonstrated severe 3-vessel coronary disease with 90% stenosis of the LAD diagonal, 90% stenosis of the OM1, and 80% to 90% stenosis of the RCA.  LV systolic function was mildly depressed.  Echo showed no significant valvular disease.  He was felt to be a candidate for surgical coronary revascularization.  Prior to surgery, I reviewed the results of his cardiac catheterization and echo with the patient.  I discussed the indications and expected benefits of coronary artery bypass surgery  for treatment of his severe coronary artery disease.  I reviewed the major aspects of surgery including use of general anesthesia and cardiopulmonary bypass, the location of the surgical incisions, and the expected postoperative hospital recovery.  I reviewed with him the risks to him of the surgery including risks of MI, stroke, bleeding, blood transfusion requirement, postoperative pulmonary problems including pleural effusion, infection, and death.  After reviewing these issues, he demonstrated his understanding and agreed to proceed with the surgery under what I felt was an informed consent.  OPERATIVE FINDINGS: 1. Adequate conduit. 2. Preoperative anemia requiring 2 units packed cells for the surgery,     used to prime the cardiopulmonary bypass circuit. 3. Severe diffuse coronary disease with suboptimal, but adequate     targets.  PROCEDURE IN DETAIL:  The patient was brought to the operating room, placed supine on the operating table.  General anesthesia was induced under invasive hemodynamic monitoring.  The chest, abdomen, and legs were prepped with Betadine and draped as a sterile field.  A transesophageal echo probe was placed by the anesthesiologist.  A proper time-out was performed.  A sternal incision was made as the saphenous vein was harvested endoscopically from the right leg.  The left internal mammary artery was harvested as a pedicle graft from its origin at the subclavian vessels. It was 1.5-mm vessel with good flow.  The sternal  retractor was placed. The pericardium was opened and suspended.  Pursestrings were placed in the ascending aorta and right atrium and heparin was administered.  When the ACT was documented as being therapeutic, the patient was cannulated and placed on cardiopulmonary bypass.  The coronaries were identified for grafting for the targets.  Cardioplegia cannulas were placed both antegrade aortic and retrograde coronary sinus cardioplegia.   The patient was cooled to 32 degrees and an aortic crossclamp was applied.  One liter of cold blood cardioplegia was delivered in split doses between the antegrade aortic and retrograde coronary sinus catheters.  There was good cardioplegic arrest and septal temperature dropped less than 12 degrees.  Cardioplegia was delivered every 20 minutes.  The distal coronary anastomoses were performed.  The first distal anastomosis was the posterior descending.  Proximally, there was a very heavy calcified stenotic area.  A reverse saphenous vein was sewn end-to- side with running 7-0 Prolene with good flow through graft. Cardioplegia was redosed.  The second distal anastomosis was to the OM1 branch of the left coronary.  This was referred to as the ramus branch on the cath report. It had a proximal 90% stenosis.  It was 1.5-mm vessel.  A reverse saphenous vein was sewn end-to-side with running 7-0 Prolene with good flow through the graft.  Cardioplegia was redosed.  The third distal anastomosis was to the first diagonal branch to the LAD.  It was a 1.5-mm vessel, proximal 80%-90% stenosis.  A reverse saphenous vein was sewn end-to-side with running 7-0 Prolene.  There was good flow through the graft.  Cardioplegia was redosed.  The fourth distal anastomosis was to the distal third to the LAD.  It was a thickened diseased vessel.  There was a proximal 90% stenosis. The left IMA pedicle was brought through an opening, and the left lateral pericardium was brought down onto the LAD and sewn end-to-side with running 8-0 Prolene.  There was good flow through the anastomosis after briefly releasing the pedicle, bulldog on the mammary artery and the bulldog was reapplied.  The pedicle was secured to the epicardium. Cardioplegia was redosed.  While the crossclamp was still in place, 3 proximal vein anastomoses were performed on the ascending aorta with a 4.5 mm punch and running 6- 0 Prolene.   Prior to removing the crossclamp, air was vented from the coronaries in the left side of the heart using a dose of retrograde warm blood cardioplegia and the usual de-airing maneuvers.  The crossclamp was removed.  The heart was cardioverted back to a regular rhythm.  The vein grafts were aspirated.  On opening, each had good flow.  Hemostasis was documented at the proximal and distal sites.  The patient was rewarmed and reperfused.  Temporary pacing wires were applied.  The lungs were expanded, ventilator was resumed.  The patient was placed on low-dose milrinone.  He was weaned off cardiopulmonary bypass without difficulty. Echo showed preserved LV systolic function with almost normal EF. Cardiac output was 8 L/minute.  Protamine was administered without adverse reaction.  The cannula was removed.  The mediastinum was irrigated with warm saline.  The superior pericardial fat was closed over the aorta.  The anterior mediastinal and left pleural chest tubes were placed and brought through separate incisions.  The sternum was closed with wire.  The pectoralis fascia was closed in running #1 Vicryl.  The subcutaneous and skin layers were closed in running Vicryl. The patient returned to the ICU in critical, but stable  condition. Total cardiopulmonary bypass time was 134 minutes.     Ivin Poot, M.D.     PV/MEDQ  D:  04/03/2015  T:  04/03/2015  Job:  [id number]

## 2015-04-03 NOTE — Progress Notes (Signed)
Patient ID: [name], male   DOB: [birth year], 64 y.o.   MRN: [id number]   SICU Evening Rounds:   Hemodynamically stable  CI = 2.11  Awake on vent.  On CPAP   Urine output good  CT output low  CBC    Component Value Date/Time   WBC 10.0 04/03/2015 1415   RBC 3.01* 04/03/2015 1415   HGB 9.9* 04/03/2015 1421   HCT 29.0* 04/03/2015 1421   PLT 169 04/03/2015 1415   MCV 90.4 04/03/2015 1415   MCH 31.2 04/03/2015 1415   MCHC 34.6 04/03/2015 1415   RDW 15.4 04/03/2015 1415   LYMPHSABS 2.0 04/02/2015 0746   MONOABS 0.5 04/02/2015 0746   EOSABS 0.1 04/02/2015 0746   BASOSABS 0.0 04/02/2015 0746     BMET    Component Value Date/Time   NA 134* 04/03/2015 1421   K 4.4 04/03/2015 1421   CL 99* 04/03/2015 1256   CO2 26 04/03/2015 0604   GLUCOSE 170* 04/03/2015 1421   BUN 47* 04/03/2015 1256   CREATININE 8.30* 04/03/2015 1256   CALCIUM 8.3* 04/03/2015 0604   CALCIUM 8.3* 01/14/2014 1708   GFRNONAA 5* 04/03/2015 0604   GFRAA 6* 04/03/2015 0604     A/P:  Stable postop course. Continue current plans

## 2015-04-03 NOTE — OR Nursing (Signed)
SICU notified of 39min call prior to pt arrival

## 2015-04-03 NOTE — OR Nursing (Signed)
SICU notified of 1 hr before pt arrival 

## 2015-04-03 NOTE — Progress Notes (Signed)
The patient was examined and preop studies reviewed. There has been no change from the prior exam and the patient is ready for surgery. Plan CABG on M Joshi for CAD today

## 2015-04-03 NOTE — OR Nursing (Signed)
SICU notified of 20 min call

## 2015-04-03 NOTE — Anesthesia Procedure Notes (Signed)
Procedure Name: Intubation Date/Time: 04/03/2015 8:03 AM Performed by: Salli Quarry Shannyn Jankowiak Pre-anesthesia Checklist: Patient identified, Emergency Drugs available, Suction available and Patient being monitored Patient Re-evaluated:Patient Re-evaluated prior to inductionOxygen Delivery Method: Circle system utilized Preoxygenation: Pre-oxygenation with 100% oxygen Intubation Type: IV induction Ventilation: Mask ventilation without difficulty and Oral airway inserted - appropriate to patient size Laryngoscope Size: Mac and 3 Grade View: Grade I Tube type: Oral Tube size: 8.5 mm Number of attempts: 1 Airway Equipment and Method: Stylet Placement Confirmation: ETT inserted through vocal cords under direct vision,  positive ETCO2 and breath sounds checked- equal and bilateral Secured at: 24 cm Tube secured with: Tape Dental Injury: Teeth and Oropharynx as per pre-operative assessment

## 2015-04-03 NOTE — Anesthesia Preprocedure Evaluation (Addendum)
Anesthesia Evaluation  Patient identified by MRN, date of birth, ID band Patient awake    Reviewed: Allergy & Precautions, NPO status , Patient's Chart, lab work & pertinent test results  History of Anesthesia Complications Negative for: history of anesthetic complications  Airway Mallampati: II  TM Distance: >3 FB Neck ROM: Full    Dental  (+) Dental Advisory Given, Missing   Pulmonary neg shortness of breath,    Pulmonary exam normal        Cardiovascular hypertension, Pt. on medications and Pt. on home beta blockers + CAD and + Past MI (ECHO 10/19 "Impressions: Normal LV size and systolic function, EF 0000000. Moderate diastolic dysfunction. Normal RV size and systolic function significant valvular abnormalities")  Normal cardiovascular exam     Neuro/Psych PSYCHIATRIC DISORDERS Depression    GI/Hepatic   Endo/Other  diabetes, Type 2, Insulin Dependent  Renal/GU CRF, ESRF and DialysisRenal disease (HD last 10/19)     Musculoskeletal  (+) Arthritis ,   Abdominal   Peds  Hematology  (+) anemia ,   Anesthesia Other Findings   Reproductive/Obstetrics                      Anesthesia Physical Anesthesia Plan  ASA: IV  Anesthesia Plan: General   Post-op Pain Management:    Induction: Intravenous  Airway Management Planned: Oral ETT  Additional Equipment: Arterial line, CVP, PA Cath, TEE and Ultrasound Guidance Line Placement  Intra-op Plan:   Post-operative Plan: Post-operative intubation/ventilation  Informed Consent: I have reviewed the patients History and Physical, chart, labs and discussed the procedure including the risks, benefits and alternatives for the proposed anesthesia with the patient or authorized representative who has indicated his/her understanding and acceptance.   Dental advisory given  Plan Discussed with: CRNA and Surgeon  Anesthesia Plan Comments:         Anesthesia Quick Evaluation

## 2015-04-03 NOTE — Progress Notes (Signed)
Lab pulling CBC. [name] here from [place]. Pt very drowsy from pre-op Valium

## 2015-04-03 NOTE — Transfer of Care (Signed)
Immediate Anesthesia Transfer of Care Note  Patient: [name]  Procedure(s) Performed: Procedure(s): CORONARY ARTERY BYPASS GRAFTING (CABG) x 4 (LIMA to LAD, SVG to DIAGONAL, SVG to OM1, and SVG to RCA) with EVH from right greater saphenous thigh and partial lower leg vein  (N/A) TRANSESOPHAGEAL ECHOCARDIOGRAM (TEE) (N/A)  Patient Location: SICU  Anesthesia Type:General  Level of Consciousness: sedated and Patient remains intubated per anesthesia plan  Airway & Oxygen Therapy: Patient remains intubated per anesthesia plan and Patient placed on Ventilator (see vital sign flow sheet for setting)  Post-op Assessment: Report given to RN and Post -op Vital signs reviewed and stable  Post vital signs: Reviewed and stable  Last Vitals:  Filed Vitals:   04/03/15 1425  BP:   Pulse: 95  Temp: 36.5 C  Resp: 17    Complications: No apparent anesthesia complications

## 2015-04-03 NOTE — Brief Op Note (Addendum)
03/30/2015 - 04/03/2015  12:00 PM  PATIENT:  [name]  64 y.o. male  PRE-OPERATIVE DIAGNOSIS:  1. S/p NSTEMI 2.CAD  POST-OPERATIVE DIAGNOSIS:  1. S/p NSTEMI 2.CAD  PROCEDURE: TRANSESOPHAGEAL ECHOCARDIOGRAM (TEE), MEDIAN STERNOTOMY for CORONARY ARTERY BYPASS GRAFTING (CABG) x  4 (LIMA to LAD, SVG to DIAGONAL, SVG to OM1, and SVG to RCA) with EVH from right greater saphenous thigh and partial lower leg vein   SURGEON:  Surgeon(s) and Role:    * Ivin Poot, MD - Primary  PHYSICIAN ASSISTANT: Lars Pinks PA-C  ANESTHESIA:   general  EBL:  Total I/O In: -  Out: 40 [Urine:20]  DRAINS: Chest tubes placed in the mediastinal and pleural spaces   COUNTS CORRECT:  YES  DICTATION: .Dragon Dictation  PLAN OF CARE: Admit to inpatient   PATIENT DISPOSITION:  ICU - intubated and hemodynamically stable.   Delay start of Pharmacological VTE agent (>24hrs) due to surgical blood loss or risk of bleeding: yes  BASELINE WEIGHT: 90 kg

## 2015-04-03 NOTE — Anesthesia Postprocedure Evaluation (Signed)
Anesthesia Post Note  Patient: [name]  Procedure(s) Performed: Procedure(s) (LRB): CORONARY ARTERY BYPASS GRAFTING (CABG) x 4 (LIMA to LAD, SVG to DIAGONAL, SVG to OM1, and SVG to RCA) with EVH from right greater saphenous thigh and partial lower leg vein  (N/A) TRANSESOPHAGEAL ECHOCARDIOGRAM (TEE) (N/A)  Anesthesia type: General  Patient location: ICU  Post pain: Pain level controlled  Post assessment: Post-op Vital signs reviewed  Last Vitals:  Filed Vitals:   04/03/15 1700  BP: 97/52  Pulse: 85  Temp: 36.6 C  Resp: 12    Post vital signs: stable  Level of consciousness: Patient remains intubated per anesthesia plan  Complications: No apparent anesthesia complications

## 2015-04-03 NOTE — Progress Notes (Signed)
  Echocardiogram Echocardiogram Transesophageal has been performed.  Jennette Dubin 04/03/2015, 9:11 AM

## 2015-04-03 NOTE — Progress Notes (Signed)
Pt. found on SIMV/PRVC/PS set rate of 4 with pt. breathing over at 14 along with good Vt's, placed to CPAP/PS tolerated well X63min., ABG WNL for Extubation yet pt. unable to generate more than 300 spont. VT's, NIF < than 10 CMH20, slow to respond to voice commands, RN notified at bedside, plan to hold wean until a.m.

## 2015-04-04 ENCOUNTER — Inpatient Hospital Stay (HOSPITAL_COMMUNITY): Payer: Medicare Other

## 2015-04-04 ENCOUNTER — Encounter (HOSPITAL_COMMUNITY): Payer: Self-pay | Admitting: Cardiothoracic Surgery

## 2015-04-04 LAB — POCT I-STAT 3, ART BLOOD GAS (G3+)
Acid-Base Excess: 2 mmol/L (ref 0.0–2.0)
Acid-base deficit: 6 mmol/L — ABNORMAL HIGH (ref 0.0–2.0)
Acid-base deficit: 7 mmol/L — ABNORMAL HIGH (ref 0.0–2.0)
Acid-base deficit: 6 mmol/L — ABNORMAL HIGH (ref 0.0–2.0)
Acid-base deficit: 6 mmol/L — ABNORMAL HIGH (ref 0.0–2.0)
Bicarbonate: 19 mEq/L — ABNORMAL LOW (ref 20.0–24.0)
Bicarbonate: 18 mEq/L — ABNORMAL LOW (ref 20.0–24.0)
Bicarbonate: 18.5 mEq/L — ABNORMAL LOW (ref 20.0–24.0)
Bicarbonate: 18.8 mEq/L — ABNORMAL LOW (ref 20.0–24.0)
Bicarbonate: 25.1 mEq/L — ABNORMAL HIGH (ref 20.0–24.0)
O2 Saturation: 98 %
O2 Saturation: 97 %
O2 Saturation: 97 %
O2 Saturation: 98 %
O2 Saturation: 98 %
Patient temperature: 97.3
Patient temperature: 36.3
Patient temperature: 36.3
Patient temperature: 36.5
Patient temperature: 98.6
TCO2: 20 mmol/L (ref 0–100)
TCO2: 19 mmol/L (ref 0–100)
TCO2: 19 mmol/L (ref 0–100)
TCO2: 20 mmol/L (ref 0–100)
TCO2: 26 mmol/L (ref 0–100)
pCO2 arterial: 33.3 mmHg — ABNORMAL LOW (ref 35.0–45.0)
pCO2 arterial: 30.1 mmHg — ABNORMAL LOW (ref 35.0–45.0)
pCO2 arterial: 31 mmHg — ABNORMAL LOW (ref 35.0–45.0)
pCO2 arterial: 31.4 mmHg — ABNORMAL LOW (ref 35.0–45.0)
pCO2 arterial: 32.6 mmHg — ABNORMAL LOW (ref 35.0–45.0)
pH, Arterial: 7.361 (ref 7.350–7.450)
pH, Arterial: 7.38 (ref 7.350–7.450)
pH, Arterial: 7.381 (ref 7.350–7.450)
pH, Arterial: 7.383 (ref 7.350–7.450)
pH, Arterial: 7.493 — ABNORMAL HIGH (ref 7.350–7.450)
pO2, Arterial: 112 mmHg — ABNORMAL HIGH (ref 80.0–100.0)
pO2, Arterial: 88 mmHg (ref 80.0–100.0)
pO2, Arterial: 89 mmHg (ref 80.0–100.0)
pO2, Arterial: 96 mmHg (ref 80.0–100.0)
pO2, Arterial: 94 mmHg (ref 80.0–100.0)

## 2015-04-04 LAB — BASIC METABOLIC PANEL
Anion gap: 15 (ref 5–15)
BUN: 60 mg/dL — ABNORMAL HIGH (ref 6–20)
CO2: 19 mmol/L — ABNORMAL LOW (ref 22–32)
Calcium: 8.2 mg/dL — ABNORMAL LOW (ref 8.9–10.3)
Chloride: 100 mmol/L — ABNORMAL LOW (ref 101–111)
Creatinine, Ser: 9.83 mg/dL — ABNORMAL HIGH (ref 0.61–1.24)
GFR calc Af Amer: 6 mL/min — ABNORMAL LOW (ref >60)
GFR calc non Af Amer: 5 mL/min — ABNORMAL LOW (ref >60)
Glucose, Bld: 105 mg/dL — ABNORMAL HIGH (ref 65–99)
Potassium: 5 mmol/L (ref 3.5–5.1)
Sodium: 134 mmol/L — ABNORMAL LOW (ref 135–145)

## 2015-04-04 LAB — CBC
HCT: 26.3 % — ABNORMAL LOW (ref 39.0–52.0)
HCT: 25 % — ABNORMAL LOW (ref 39.0–52.0)
Hemoglobin: 9 g/dL — ABNORMAL LOW (ref 13.0–17.0)
Hemoglobin: 8.8 g/dL — ABNORMAL LOW (ref 13.0–17.0)
MCH: 30.9 pg (ref 26.0–34.0)
MCH: 31.4 pg (ref 26.0–34.0)
MCHC: 34.2 g/dL (ref 30.0–36.0)
MCHC: 35.2 g/dL (ref 30.0–36.0)
MCV: 90.4 fL (ref 78.0–100.0)
MCV: 89.3 fL (ref 78.0–100.0)
Platelets: 215 10*3/uL (ref 150–400)
Platelets: 234 10*3/uL (ref 150–400)
RBC: 2.91 MIL/uL — ABNORMAL LOW (ref 4.22–5.81)
RBC: 2.8 MIL/uL — ABNORMAL LOW (ref 4.22–5.81)
RDW: 15.9 % — ABNORMAL HIGH (ref 11.5–15.5)
RDW: 16.1 % — ABNORMAL HIGH (ref 11.5–15.5)
WBC: 14.5 10*3/uL — ABNORMAL HIGH (ref 4.0–10.5)
WBC: 15.6 10*3/uL — ABNORMAL HIGH (ref 4.0–10.5)

## 2015-04-04 LAB — GLUCOSE, CAPILLARY
Glucose-Capillary: 100 mg/dL — ABNORMAL HIGH (ref 65–99)
Glucose-Capillary: 100 mg/dL — ABNORMAL HIGH (ref 65–99)
Glucose-Capillary: 102 mg/dL — ABNORMAL HIGH (ref 65–99)
Glucose-Capillary: 105 mg/dL — ABNORMAL HIGH (ref 65–99)
Glucose-Capillary: 105 mg/dL — ABNORMAL HIGH (ref 65–99)
Glucose-Capillary: 106 mg/dL — ABNORMAL HIGH (ref 65–99)
Glucose-Capillary: 116 mg/dL — ABNORMAL HIGH (ref 65–99)
Glucose-Capillary: 123 mg/dL — ABNORMAL HIGH (ref 65–99)
Glucose-Capillary: 124 mg/dL — ABNORMAL HIGH (ref 65–99)
Glucose-Capillary: 90 mg/dL (ref 65–99)
Glucose-Capillary: 97 mg/dL (ref 65–99)
Glucose-Capillary: 99 mg/dL (ref 65–99)

## 2015-04-04 LAB — POCT I-STAT, CHEM 8
BUN: 31 mg/dL — ABNORMAL HIGH (ref 6–20)
Calcium, Ion: 1.12 mmol/L — ABNORMAL LOW (ref 1.13–1.30)
Chloride: 101 mmol/L (ref 101–111)
Creatinine, Ser: 5.8 mg/dL — ABNORMAL HIGH (ref 0.61–1.24)
Glucose, Bld: 104 mg/dL — ABNORMAL HIGH (ref 65–99)
HCT: 23 % — ABNORMAL LOW (ref 39.0–52.0)
Hemoglobin: 7.8 g/dL — ABNORMAL LOW (ref 13.0–17.0)
Potassium: 3.6 mmol/L (ref 3.5–5.1)
Sodium: 136 mmol/L (ref 135–145)
TCO2: 24 mmol/L (ref 0–100)

## 2015-04-04 LAB — PREPARE FRESH FROZEN PLASMA
Unit division: 0
Unit division: 0

## 2015-04-04 LAB — PREPARE PLATELET PHERESIS: Unit division: 0

## 2015-04-04 LAB — HEMOGLOBIN A1C
Hgb A1c MFr Bld: 7.1 % — ABNORMAL HIGH (ref 4.8–5.6)
Mean Plasma Glucose: 157 mg/dL

## 2015-04-04 LAB — CREATININE, SERUM
Creatinine, Ser: 5.96 mg/dL — ABNORMAL HIGH (ref 0.61–1.24)
GFR calc Af Amer: 10 mL/min — ABNORMAL LOW (ref >60)
GFR calc non Af Amer: 9 mL/min — ABNORMAL LOW (ref >60)

## 2015-04-04 LAB — MAGNESIUM: Magnesium: 2.2 mg/dL (ref 1.7–2.4)

## 2015-04-04 LAB — POTASSIUM: Potassium: 4.4 mmol/L (ref 3.5–5.1)

## 2015-04-04 MED ORDER — HEPARIN SODIUM (PORCINE) 1000 UNIT/ML DIALYSIS: 1000.0000 [IU] | INTRAMUSCULAR | Status: DC | PRN

## 2015-04-04 MED ORDER — SODIUM CHLORIDE 0.9 % IV SOLN: 100.0000 mL | INTRAVENOUS | Status: DC | PRN

## 2015-04-04 MED ORDER — ALTEPLASE 2 MG IJ SOLR
2.0000 mg | Freq: Once | INTRAMUSCULAR | Status: DC | PRN
  Filled 2015-04-04: qty 2

## 2015-04-04 MED ORDER — LIDOCAINE-PRILOCAINE 2.5-2.5 % EX CREA
1.0000 "application " | TOPICAL_CREAM | CUTANEOUS | Status: DC | PRN
  Filled 2015-04-04: qty 5

## 2015-04-04 MED ORDER — POTASSIUM CHLORIDE 10 MEQ/50ML IV SOLN: 10.0000 meq | INTRAVENOUS | Status: DC

## 2015-04-04 MED ORDER — INSULIN DETEMIR 100 UNIT/ML ~~LOC~~ SOLN
12.0000 [IU] | Freq: Two times a day (BID) | SUBCUTANEOUS | Status: DC
  Administered 2015-04-04 (×2): 12 [IU] via SUBCUTANEOUS
  Filled 2015-04-04 (×4): qty 0.12

## 2015-04-04 MED ORDER — SODIUM BICARBONATE 8.4 % IV SOLN
50.0000 meq | Freq: Once | INTRAVENOUS | Status: AC
Start: 2015-04-04 — End: 2015-04-04
  Administered 2015-04-04: 50 meq via INTRAVENOUS

## 2015-04-04 MED ORDER — MIDAZOLAM HCL 2 MG/2ML IJ SOLN: 1.0000 mg | INTRAMUSCULAR | Status: DC | PRN

## 2015-04-04 MED ORDER — MILRINONE IN DEXTROSE 20 MG/100ML IV SOLN
0.2500 ug/kg/min | INTRAVENOUS | Status: AC
  Administered 2015-04-05: 0.25 ug/kg/min via INTRAVENOUS
  Filled 2015-04-04: qty 100

## 2015-04-04 MED ORDER — LIDOCAINE HCL (PF) 1 % IJ SOLN: 5.0000 mL | INTRAMUSCULAR | Status: DC | PRN

## 2015-04-04 MED ORDER — SODIUM BICARBONATE 8.4 % IV SOLN
50.0000 meq | Freq: Once | INTRAVENOUS | Status: AC
  Administered 2015-04-04: 50 meq via INTRAVENOUS
  Filled 2015-04-04: qty 50

## 2015-04-04 MED ORDER — INSULIN ASPART 100 UNIT/ML ~~LOC~~ SOLN
0.0000 [IU] | SUBCUTANEOUS | Status: DC
  Administered 2015-04-05 (×2): 2 [IU] via SUBCUTANEOUS
  Administered 2015-04-05: 8 [IU] via SUBCUTANEOUS
  Administered 2015-04-05: 4 [IU] via SUBCUTANEOUS
  Administered 2015-04-06: 12 [IU] via SUBCUTANEOUS
  Administered 2015-04-06 (×2): 4 [IU] via SUBCUTANEOUS
  Administered 2015-04-06: 2 [IU] via SUBCUTANEOUS
  Administered 2015-04-06: 8 [IU] via SUBCUTANEOUS

## 2015-04-04 MED ORDER — PENTAFLUOROPROP-TETRAFLUOROETH EX AERO: 1.0000 "application " | INHALATION_SPRAY | CUTANEOUS | Status: DC | PRN

## 2015-04-04 NOTE — Procedures (Signed)
Extubation Procedure Note  Patient Details:   Name: [name] DOB: 08-Oct-1950 MRN: [id number]   Pt extubated to 4L [place] per protocol. VS WNL, pt able to vocalize, no stridor noted. Pt tolerating well at this time, RT will continue to monitor.    Evaluation  O2 sats: stable throughout Complications: No apparent complications Patient did tolerate procedure well. Bilateral Breath Sounds: Diminished   Yes  Jetty Peeks 04/04/2015, 9:27 AM

## 2015-04-04 NOTE — Progress Notes (Signed)
Renal MD called and made aware of pt complaints of feeling like his lungs are filling up with fluid and this RN's conversation with the HD RN's. MD stated he would called the HD RN's. New order of 1 amp of bicarb given from TCTS MD. Order placed and given. Pt currently stable and no other orders received at this time.

## 2015-04-04 NOTE — Progress Notes (Signed)
1 Day Post-Op Procedure(s) (LRB): CORONARY ARTERY BYPASS GRAFTING (CABG) x 4 (LIMA to LAD, SVG to DIAGONAL, SVG to OM1, and SVG to RCA) with EVH from right greater saphenous thigh and partial lower leg vein  (N/A) TRANSESOPHAGEAL ECHOCARDIOGRAM (TEE) (N/A) Subjective: Awake on vent w/ clear CXR- try to extubate this am Nsr, hemodynamics stable Min chest tube output  Objective: Vital signs in last 24 hours: Temp:  [97.2 F (36.2 C)-98.2 F (36.8 C)] 97.2 F (36.2 C) (10/21 0700) Pulse Rate:  [70-95] 73 (10/21 0700) Cardiac Rhythm:  [-] Normal sinus rhythm (10/21 0000) Resp:  [0-21] 20 (10/21 0700) BP: (85-170)/(40-63) 113/48 mmHg (10/21 0700) SpO2:  [98 %-100 %] 100 % (10/21 0700) Arterial Line BP: (94-225)/(36-220) 129/46 mmHg (10/21 0700) FiO2 (%):  [40 %-50 %] 40 % (10/21 0400) Weight:  [207 lb 14.3 oz (94.3 kg)] 207 lb 14.3 oz (94.3 kg) (10/21 0500)  Hemodynamic parameters for last 24 hours: PAP: (23-40)/(13-21) 30/13 mmHg CO:  [2.7 L/min-5.6 L/min] 2.7 L/min CI:  [1.8 L/min/m2-6 L/min/m2] 6 L/min/m2  Intake/Output from previous day: 10/20 0701 - 10/21 0700 In: 4276.7 [I.V.:2362.7; AD:[id number]; NG/GT:90; IV Piggyback:550] Out: 555 [Urine:35; Emesis/NG output:100; Chest Tube:420] Intake/Output this shift:    Neuro intact extrem warm Lungs clear  Lab Results:  Recent Labs  04/03/15 2010 04/03/15 2012 04/04/15 0410  WBC 16.3*  --  14.5*  HGB 9.2* 8.5* 9.0*  HCT 27.0* 25.0* 26.3*  PLT 223  --  215   BMET:  Recent Labs  04/03/15 1645  04/03/15 2012 04/04/15 0410  NA 134*  --  133* 134*  K 4.4  --  5.3* 5.0  CL 103  --  102 100*  CO2 22  --   --  19*  GLUCOSE 137*  --  132* 105*  BUN 51*  --  53* 60*  CREATININE 8.57*  < > 9.30* 9.83*  CALCIUM 7.0*  --   --  8.2*  < > = values in this interval not displayed.  PT/INR:  Recent Labs  04/03/15 1415  LABPROT 17.5*  INR 1.43   ABG    Component Value Date/Time   PHART 7.361 04/04/2015 0413   HCO3  19.0* 04/04/2015 0413   TCO2 20 04/04/2015 0413   ACIDBASEDEF 6.0* 04/04/2015 0413   O2SAT 98.0 04/04/2015 0413   CBG (last 3)   Recent Labs  04/04/15 0153 04/04/15 0411 04/04/15 0701  GLUCAP 105* 105* 90    Assessment/Plan: S/P Procedure(s) (LRB): CORONARY ARTERY BYPASS GRAFTING (CABG) x 4 (LIMA to LAD, SVG to DIAGONAL, SVG to OM1, and SVG to RCA) with EVH from right greater saphenous thigh and partial lower leg vein  (N/A) TRANSESOPHAGEAL ECHOCARDIOGRAM (TEE) (N/A) Mobilize Diabetes control d/c tubes/lines repeat SBT , hope to extubate   LOS: 4 days    Tharon Aquas Trigt III 04/04/2015

## 2015-04-04 NOTE — Progress Notes (Signed)
On HD now, off insulin and dopamine drips, still on IV NTG drip.  Have asked RN to dc any kvo's we can to keep fluid off. Getting HD and breathing ok now.  Would consider restarting some of his oral BP medications if card surg agrees - suggest possibly clonidine 0.1 tid and norvasc at 5 bid w hold orders for SBP < 125, and then taper IV NTG off as tolerated.    Kelly Splinter MD [employer] pager [phone]    cell [phone] 04/04/2015, 4:48 PM

## 2015-04-04 NOTE — Care Management Important Message (Signed)
Important Message  Patient Details  Name: [name] MRN: [id number] Date of Birth: 1951-01-21   Medicare Important Message Given:  Yes-second notification given    Nathen May 04/04/2015, 11:51 AM

## 2015-04-04 NOTE — Progress Notes (Signed)
Wean protocol initated

## 2015-04-04 NOTE — Progress Notes (Signed)
Patient ID: [name], male   DOB: Jul 26, 1950, 64 y.o.   MRN: [id number] EVENING ROUNDS NOTE :     [address]       [place] [address]             [phone]                 1 Day Post-Op Procedure(s) (LRB): CORONARY ARTERY BYPASS GRAFTING (CABG) x 4 (LIMA to LAD, SVG to DIAGONAL, SVG to OM1, and SVG to RCA) with EVH from right greater saphenous thigh and partial lower leg vein  (N/A) TRANSESOPHAGEAL ECHOCARDIOGRAM (TEE) (N/A)  Total Length of Stay:  LOS: 4 days  BP 133/51 mmHg  Pulse 109  Temp(Src) 98.8 F (37.1 C) (Oral)  Resp 21  Ht 6\' 5"  (1.956 m)  Wt 214 lb 8.1 oz (97.3 kg)  BMI 25.43 kg/m2  SpO2 99%  .Intake/Output      10/21 0701 - 10/22 0700   I.V. (mL/kg) 661.4 (6.8)   Blood    NG/GT    IV Piggyback 50   Total Intake(mL/kg) 711.4 (7.3)   Urine (mL/kg/hr) 30 (0)   Emesis/NG output 0 (0)   Other 2100 (1.5)   Chest Tube 120 (0.1)   Total Output 2250   Net -1538.6         . sodium chloride Stopped (04/04/15 1100)  . sodium chloride    . sodium chloride 10 mL/hr at 04/04/15 2000  . DOPamine Stopped (04/04/15 0900)  . insulin (NOVOLIN-R) infusion Stopped (04/04/15 1200)  . lactated ringers    . lactated ringers    . milrinone 0.25 mcg/kg/min (04/04/15 2000)  . nitroGLYCERIN 40 mcg/min (04/04/15 2000)  . phenylephrine (NEO-SYNEPHRINE) Adult infusion Stopped (04/03/15 2200)     Lab Results  Component Value Date   WBC 15.6* 04/04/2015   HGB 8.8* 04/04/2015   HCT 25.0* 04/04/2015   PLT 234 04/04/2015   GLUCOSE 104* 04/04/2015   CHOL 210* 04/01/2015   TRIG 68 04/01/2015   HDL 38* 04/01/2015   LDLCALC 158* 04/01/2015   ALT 14* 04/03/2015   AST 15 04/03/2015   NA 136 04/04/2015   K 4.4 04/04/2015   CL 101 04/04/2015   CREATININE 5.96* 04/04/2015   BUN 31* 04/04/2015   CO2 19* 04/04/2015   INR 1.43 04/03/2015   HGBA1C 7.1* 04/03/2015   Had dialysis today, stable tonight breathing better.  [name] MD  Beeper  [phone] Office [phone] 04/04/2015 9:18 PM

## 2015-04-04 NOTE — Progress Notes (Signed)
HD RN called and made aware that pt feels like his lungs are filling with fluid and he feels like he needs his dialysis soon. HD RN informed this RN that they were backed up and would get to him as soon as they good. Made HD RN aware that this pt should be considered a priority based on clinical status. MD called in OR and made aware updates with pt status and pt feelings of his "lungs filling with fluid".  No orders received at this time.

## 2015-04-04 NOTE — Progress Notes (Addendum)
Pt. Placed back on full support due to not being able to perform mechanics as expected. Pt. Only performed -10 on the NIF.

## 2015-04-04 NOTE — Progress Notes (Signed)
Subjective:  A bit SOB and wheezing. CXR no gross edema.    Objective Vital signs in last 24 hours: Filed Vitals:   04/04/15 1000 04/04/15 1015 04/04/15 1030 04/04/15 1045  BP: 130/65     Pulse: 87 87 87 87  Temp: 97.5 F (36.4 C) 97.5 F (36.4 C)    TempSrc:      Resp: 16 18 21 18   Height:      Weight:      SpO2: 99% 98% 99% 99%   Weight change: 1.1 kg (2 lb 6.8 oz)  Physical Exam: General: intubated post CABG Heart: RRR no mur , rub or gallop Lungs: intubated on vent CTA Abdomen: NT , soft Extremities:no pedal edema Dialysis Access: pos bruit L U A AVF     OP HD  MWF [facility]) 4hr edw 95 kg 2k.2.5 ca LA AVGG HEParin 3000, Venofer 100mg  q tx start 10/19 end 04/23/15/ Mircera 75  q 2wks last on 03/17/15   Calcitriol  1.0 mcg po q hd  oplabs =pth 774 03/26/15  /phos 7 ca 9 /hgb 8.8 03/26/15  Assessment: 1 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 , sp  CABG 10/20 2 ESRD HD mwf 3 HTN longstanding (on norvasc/ coreg/ clon/ losartan at home), on IV NTG now 4 Vol - below dry wt but up 6kg from lowest wt here 5 DM2 on insulin 6 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/ phoslo/ ferric citrate po vit d when pos 7 Anemia Hb 8's, started darbe 60/wk, observe continue Fe load as op   Plan - short HD today and again tomorrow, get vol down   Kelly Splinter MD [facility] pager [phone]    cell [phone] 04/04/2015, 11:05 AM    Labs: Basic Metabolic Panel:  Recent Labs Lab 03/31/15 0353  04/01/15 1710 04/02/15 0746 04/03/15 0604  04/03/15 1645 04/03/15 2010 04/03/15 2012 04/04/15 0410  NA 135  < > 129* 135 133*  < > 134*  --  133* 134*  K 4.2  < > 4.6 4.2 4.2  < > 4.4  --  5.3* 5.0  CL 97*  < > 89* 89* 94*  < > 103  --  102 100*  CO2 24  < > 24 27 26   --  22  --   --  19*  GLUCOSE 189*  < > 264* 125* 157*  < > 137*  --  132* 105*  BUN 87*  < > 74* 90* 53*  < > 51*  --  53* 60*  CREATININE 13.68*  < > 11.73* 13.03* 9.15*  < > 8.57* 9.28* 9.30* 9.83*  CALCIUM 8.9  < > 9.0 9.3 8.3*  --   7.0*  --   --  8.2*  PHOS 6.3*  --  6.1* 6.4*  --   --   --   --   --   --   < > = values in this interval not displayed. Liver Function Tests:  Recent Labs Lab 04/01/15 0711 04/01/15 1710 04/02/15 0746 04/03/15 0604  AST 29  --   --  15  ALT 18  --   --  14*  ALKPHOS 48  --   --  42  BILITOT 0.6  --   --  0.4  PROT 6.8  --   --  5.9*  ALBUMIN 3.2* 3.3* 3.1* 2.9*   No results for input(s): LIPASE, AMYLASE in the last 168 hours. No results for input(s): AMMONIA in the last 168 hours.  CBC:  Recent Labs Lab 03/30/15 2333  04/02/15 0635 04/02/15 0746  04/03/15 1415  04/03/15 2010 04/03/15 2012 04/04/15 0410  WBC 12.8*  < > 8.1 8.4  --  10.0  --  16.3*  --  14.5*  NEUTROABS 10.6*  --   --  5.8  --   --   --   --   --   --   HGB 10.2*  < > 8.3* 8.8*  < > 9.4*  < > 9.2* 8.5* 9.0*  HCT 30.0*  < > 23.8* 25.3*  < > 27.2*  < > 27.0* 25.0* 26.3*  MCV 94.9  < > 91.2 92.3  --  90.4  --  90.3  --  90.4  PLT 233  < > 221 215  < > 169  --  223  --  215  < > = values in this interval not displayed. Cardiac Enzymes:  Recent Labs Lab 03/31/15 1312 03/31/15 1831 04/01/15 0140 04/01/15 0711  TROPONINI 13.87* 14.46* 8.51* 6.25*   CBG:  Recent Labs Lab 04/03/15 2300 04/04/15 0018 04/04/15 0153 04/04/15 0411 04/04/15 0701  GLUCAP 129* 124* 105* 105* 90    Studies/Results: Dg Chest Port 1 View  04/04/2015  CLINICAL DATA:  Status post CABG. EXAM: PORTABLE CHEST 1 VIEW COMPARISON:  04/03/2015 FINDINGS: Endotracheal tube remains with the tip approximately 3 cm above the carina. Swan-Ganz catheter tip shows stable positioning in the left pulmonary artery. Central line tip is near the cavoatrial junction or just in the upper right atrium. Left chest tube remains. No pneumothorax identified. Stable bilateral lower lobe atelectasis. No pulmonary edema. Stable cardiac and mediastinal contours. IMPRESSION: No pneumothorax. Stable positioning of catheters, chest tube and endotracheal tube.  Stable bilateral lower lobe atelectasis. Electronically Signed   By: Aletta Edouard M.D.   On: 04/04/2015 07:54   Dg Chest Port 1 View  04/03/2015  CLINICAL DATA:  Bypass surgery. EXAM: PORTABLE CHEST 1 VIEW COMPARISON:  03/30/2015 FINDINGS: The endotracheal tube is 2.6 cm above the carina. The NG tube is in the stomach. The right IJ Swan-Ganz catheter tip is in the proximal left pulmonary artery. A right subclavian central venous catheter tip is in the right atrium. Mediastinal drain tubes and a left chest tube are in place. No definite pneumothorax. The heart is normal in size. The mediastinal and hilar contours appear normal. There is mild vascular congestion and streaky areas of atelectasis. No edema or effusions. IMPRESSION: 1. ET, NG and Swan-Ganz catheter in good position. 2. The right subclavian center venous catheter tip is in the right atrium. 3. Low lung volumes with vascular crowding and bibasilar atelectasis. Mild vascular congestion but no edema or effusions. Electronically Signed   By: Marijo Sanes M.D.   On: 04/03/2015 15:13   Medications: . sodium chloride 20 mL/hr at 04/04/15 1000  . sodium chloride    . sodium chloride 20 mL/hr at 04/04/15 1000  . DOPamine Stopped (04/04/15 0900)  . insulin (NOVOLIN-R) infusion 2.2 Units/hr (04/04/15 1000)  . lactated ringers    . lactated ringers    . milrinone 0.251 mcg/kg/min (04/04/15 1000)  . nitroGLYCERIN 100 mcg/min (04/04/15 1000)  . phenylephrine (NEO-SYNEPHRINE) Adult infusion Stopped (04/03/15 2200)   . acetaminophen  1,000 mg Oral 4 times per day   Or  . acetaminophen (TYLENOL) oral liquid 160 mg/5 mL  1,000 mg Per Tube 4 times per day  . antiseptic oral rinse  7 mL Mouth Rinse QID  .  aspirin EC  325 mg Oral Daily   Or  . aspirin  324 mg Per Tube Daily  . atorvastatin  80 mg Oral q1800  . bisacodyl  10 mg Oral Daily   Or  . bisacodyl  10 mg Rectal Daily  . calcium acetate  667 mg Oral Q breakfast  . cefUROXime (ZINACEF)   IV  1.5 g Intravenous Q24 Hr x 2  . chlorhexidine  15 mL Mouth/Throat BID  . cholecalciferol  1,000 Units Oral Daily  . docusate sodium  200 mg Oral Daily  . [START ON 04/05/2015] ferric gluconate (FERRLECIT/NULECIT) IV  125 mg Intravenous Q T,Th,Sa-HD  . insulin aspart  0-24 Units Subcutaneous 6 times per day  . insulin detemir  12 Units Subcutaneous BID  . insulin regular  0-10 Units Intravenous TID WC  . metoprolol tartrate  12.5 mg Oral BID   Or  . metoprolol tartrate  12.5 mg Per Tube BID  . multivitamin with minerals   Oral Daily  . [START ON 04/05/2015] pantoprazole  40 mg Oral Daily  . sodium chloride  10 mL Intravenous Q12H  . sodium chloride  3 mL Intravenous Q12H

## 2015-04-05 ENCOUNTER — Inpatient Hospital Stay (HOSPITAL_COMMUNITY): Payer: Medicare Other

## 2015-04-05 LAB — CBC
HCT: 22.1 % — ABNORMAL LOW (ref 39.0–52.0)
Hemoglobin: 7.7 g/dL — ABNORMAL LOW (ref 13.0–17.0)
MCH: 31.8 pg (ref 26.0–34.0)
MCHC: 34.8 g/dL (ref 30.0–36.0)
MCV: 91.3 fL (ref 78.0–100.0)
Platelets: 186 10*3/uL (ref 150–400)
RBC: 2.42 MIL/uL — ABNORMAL LOW (ref 4.22–5.81)
RDW: 16.4 % — ABNORMAL HIGH (ref 11.5–15.5)
WBC: 13.8 10*3/uL — ABNORMAL HIGH (ref 4.0–10.5)

## 2015-04-05 LAB — BASIC METABOLIC PANEL
Anion gap: 12 (ref 5–15)
BUN: 36 mg/dL — ABNORMAL HIGH (ref 6–20)
CO2: 25 mmol/L (ref 22–32)
Calcium: 8.3 mg/dL — ABNORMAL LOW (ref 8.9–10.3)
Chloride: 96 mmol/L — ABNORMAL LOW (ref 101–111)
Creatinine, Ser: 7.58 mg/dL — ABNORMAL HIGH (ref 0.61–1.24)
GFR calc Af Amer: 8 mL/min — ABNORMAL LOW (ref >60)
GFR calc non Af Amer: 7 mL/min — ABNORMAL LOW (ref >60)
Glucose, Bld: 214 mg/dL — ABNORMAL HIGH (ref 65–99)
Potassium: 4.6 mmol/L (ref 3.5–5.1)
Sodium: 133 mmol/L — ABNORMAL LOW (ref 135–145)

## 2015-04-05 LAB — GLUCOSE, CAPILLARY
Glucose-Capillary: 112 mg/dL — ABNORMAL HIGH (ref 65–99)
Glucose-Capillary: 197 mg/dL — ABNORMAL HIGH (ref 65–99)
Glucose-Capillary: 217 mg/dL — ABNORMAL HIGH (ref 65–99)
Glucose-Capillary: 148 mg/dL — ABNORMAL HIGH (ref 65–99)
Glucose-Capillary: 139 mg/dL — ABNORMAL HIGH (ref 65–99)
Glucose-Capillary: 105 mg/dL — ABNORMAL HIGH (ref 65–99)

## 2015-04-05 LAB — POCT I-STAT 3, ART BLOOD GAS (G3+)
Acid-Base Excess: 2 mmol/L (ref 0.0–2.0)
Bicarbonate: 25.3 mEq/L — ABNORMAL HIGH (ref 20.0–24.0)
O2 Saturation: 97 %
Patient temperature: 99
TCO2: 26 mmol/L (ref 0–100)
pCO2 arterial: 33.6 mmHg — ABNORMAL LOW (ref 35.0–45.0)
pH, Arterial: 7.484 — ABNORMAL HIGH (ref 7.350–7.450)
pO2, Arterial: 83 mmHg (ref 80.0–100.0)

## 2015-04-05 MED ORDER — SODIUM CHLORIDE 0.9 % IV SOLN: 100.0000 mL | INTRAVENOUS | Status: DC | PRN

## 2015-04-05 MED ORDER — RENA-VITE PO TABS
1.0000 | ORAL_TABLET | Freq: Every day | ORAL | Status: DC
  Administered 2015-04-06 – 2015-04-09 (×4): 1 via ORAL
  Filled 2015-04-05 (×5): qty 1

## 2015-04-05 MED ORDER — PENTAFLUOROPROP-TETRAFLUOROETH EX AERO: 1.0000 "application " | INHALATION_SPRAY | CUTANEOUS | Status: DC | PRN

## 2015-04-05 MED ORDER — HEPARIN SODIUM (PORCINE) 1000 UNIT/ML DIALYSIS: 1000.0000 [IU] | INTRAMUSCULAR | Status: DC | PRN

## 2015-04-05 MED ORDER — LIDOCAINE-PRILOCAINE 2.5-2.5 % EX CREA
1.0000 "application " | TOPICAL_CREAM | CUTANEOUS | Status: DC | PRN
  Filled 2015-04-05: qty 5

## 2015-04-05 MED ORDER — DARBEPOETIN ALFA 100 MCG/0.5ML IJ SOSY: 100.0000 ug | PREFILLED_SYRINGE | INTRAMUSCULAR | Status: DC

## 2015-04-05 MED ORDER — INSULIN DETEMIR 100 UNIT/ML ~~LOC~~ SOLN
20.0000 [IU] | Freq: Two times a day (BID) | SUBCUTANEOUS | Status: DC
  Administered 2015-04-05 – 2015-04-10 (×10): 20 [IU] via SUBCUTANEOUS
  Filled 2015-04-05 (×14): qty 0.2

## 2015-04-05 MED ORDER — ALTEPLASE 2 MG IJ SOLR
2.0000 mg | Freq: Once | INTRAMUSCULAR | Status: DC | PRN
  Filled 2015-04-05: qty 2

## 2015-04-05 MED ORDER — DARBEPOETIN ALFA 100 MCG/0.5ML IJ SOSY
100.0000 ug | PREFILLED_SYRINGE | Freq: Once | INTRAMUSCULAR | Status: AC
  Administered 2015-04-05: 100 ug via INTRAVENOUS
  Filled 2015-04-05: qty 0.5

## 2015-04-05 MED ORDER — LIDOCAINE HCL (PF) 1 % IJ SOLN: 5.0000 mL | INTRAMUSCULAR | Status: DC | PRN

## 2015-04-05 MED ORDER — DEXTROSE 5 % IV SOLN
1.5000 g | Freq: Once | INTRAVENOUS | Status: AC
  Administered 2015-04-05: 1.5 g via INTRAVENOUS
  Filled 2015-04-05: qty 1.5

## 2015-04-05 MED ORDER — CLONIDINE HCL 0.1 MG PO TABS
0.1000 mg | ORAL_TABLET | Freq: Three times a day (TID) | ORAL | Status: DC
  Filled 2015-04-05 (×3): qty 1

## 2015-04-05 NOTE — Progress Notes (Signed)
Patient ID: [name], male   DOB: [birth year], 64 y.o.   MRN: [id number] EVENING ROUNDS NOTE :     [address]       [place] [address]             [phone]                 2 Days Post-Op Procedure(s) (LRB): CORONARY ARTERY BYPASS GRAFTING (CABG) x 4 (LIMA to LAD, SVG to DIAGONAL, SVG to OM1, and SVG to RCA) with EVH from right greater saphenous thigh and partial lower leg vein  (N/A) TRANSESOPHAGEAL ECHOCARDIOGRAM (TEE) (N/A)  Total Length of Stay:  LOS: 5 days  BP 118/52 mmHg  Pulse 102  Temp(Src) 98.5 F (36.9 C) (Oral)  Resp 19  Ht 6\' 5"  (1.956 m)  Wt 206 lb 2.1 oz (93.5 kg)  BMI 24.44 kg/m2  SpO2 97%  .Intake/Output      10/22 0701 - 10/23 0700   P.O. 660   I.V. (mL/kg) 137 (1.5)   IV Piggyback 50   Total Intake(mL/kg) 847 (9.1)   Urine (mL/kg/hr)    Emesis/NG output    Other 3500 (3.1)   Chest Tube 20 (0)   Total Output 3520   Net -2673         . sodium chloride Stopped (04/04/15 1100)  . sodium chloride    . sodium chloride 10 mL/hr at 04/05/15 0400  . lactated ringers    . lactated ringers    . nitroGLYCERIN Stopped (04/05/15 0600)  . phenylephrine (NEO-SYNEPHRINE) Adult infusion Stopped (04/03/15 2200)     Lab Results  Component Value Date   WBC 13.8* 04/05/2015   HGB 7.7* 04/05/2015   HCT 22.1* 04/05/2015   PLT 186 04/05/2015   GLUCOSE 214* 04/05/2015   CHOL 210* 04/01/2015   TRIG 68 04/01/2015   HDL 38* 04/01/2015   LDLCALC 158* 04/01/2015   ALT 14* 04/03/2015   AST 15 04/03/2015   NA 133* 04/05/2015   K 4.6 04/05/2015   CL 96* 04/05/2015   CREATININE 7.58* 04/05/2015   BUN 36* 04/05/2015   CO2 25 04/05/2015   INR 1.43 04/03/2015   HGBA1C 7.1* 04/03/2015   restarted clonidine but had to be stopped due to bp on dialysis dialysis done today  [name] MD  Beeper [phone] Office [phone] 04/05/2015 7:14 PM

## 2015-04-05 NOTE — Progress Notes (Signed)
Patient ID: [name], male   DOB: 28-Feb-1951, 64 y.o.   MRN: [id number] TCTS DAILY ICU PROGRESS NOTE                   [address]            [place] [address]          [phone]   2 Days Post-Op Procedure(s) (LRB): CORONARY ARTERY BYPASS GRAFTING (CABG) x 4 (LIMA to LAD, SVG to DIAGONAL, SVG to OM1, and SVG to RCA) with EVH from right greater saphenous thigh and partial lower leg vein  (N/A) TRANSESOPHAGEAL ECHOCARDIOGRAM (TEE) (N/A)  Total Length of Stay:  LOS: 5 days   Subjective: Alert neuro intact  Objective: Vital signs in last 24 hours: Temp:  [97.2 F (36.2 C)-99.3 F (37.4 C)] 99.3 F (37.4 C) (10/22 0800) Pulse Rate:  [62-111] 105 (10/22 0800) Cardiac Rhythm:  [-] Sinus tachycardia (10/22 0800) Resp:  [10-26] 19 (10/22 0800) BP: (103-151)/(47-67) 139/62 mmHg (10/22 0800) SpO2:  [95 %-100 %] 99 % (10/22 0800) Arterial Line BP: (113-217)/(43-85) 161/53 mmHg (10/22 0800) Weight:  [214 lb 8.1 oz (97.3 kg)] 214 lb 8.1 oz (97.3 kg) (10/22 0500)  Filed Weights   04/04/15 0500 04/04/15 1400 04/05/15 0500  Weight: 207 lb 14.3 oz (94.3 kg) 214 lb 8.1 oz (97.3 kg) 214 lb 8.1 oz (97.3 kg)    Weight change: 6 lb 9.8 oz (3 kg)   Hemodynamic parameters for last 24 hours: PAP: (35-47)/(15-23) 47/22 mmHg CO:  [6.2 L/min] 6.2 L/min CI:  [3.9 L/min/m2] 3.9 L/min/m2  Intake/Output from previous day: 10/21 0701 - 10/22 0700 In: 1124.2 [P.O.:120; I.V.:954.2; IV Piggyback:50] Out: 2260 [Urine:30; Chest Tube:130]  Intake/Output this shift: Total I/O In: 26.8 [I.V.:26.8] Out: 20 [Chest Tube:20]  Current Meds: Scheduled Meds: . acetaminophen  1,000 mg Oral 4 times per day   Or  . acetaminophen (TYLENOL) oral liquid 160 mg/5 mL  1,000 mg Per Tube 4 times per day  . antiseptic oral rinse  7 mL Mouth Rinse QID  . aspirin EC  325 mg Oral Daily   Or  . aspirin  324 mg Per Tube Daily  . atorvastatin  80 mg Oral q1800  . bisacodyl  10 mg Oral Daily     Or  . bisacodyl  10 mg Rectal Daily  . calcium acetate  667 mg Oral Q breakfast  . cefUROXime (ZINACEF)  IV  1.5 g Intravenous Q24 Hr x 2  . chlorhexidine  15 mL Mouth/Throat BID  . cholecalciferol  1,000 Units Oral Daily  . docusate sodium  200 mg Oral Daily  . ferric gluconate (FERRLECIT/NULECIT) IV  125 mg Intravenous Q T,Th,Sa-HD  . insulin aspart  0-24 Units Subcutaneous 6 times per day  . insulin detemir  12 Units Subcutaneous BID  . metoprolol tartrate  12.5 mg Oral BID   Or  . metoprolol tartrate  12.5 mg Per Tube BID  . multivitamin with minerals   Oral Daily  . pantoprazole  40 mg Oral Daily  . sodium chloride  10 mL Intravenous Q12H  . sodium chloride  3 mL Intravenous Q12H   Continuous Infusions: . sodium chloride Stopped (04/04/15 1100)  . sodium chloride    . sodium chloride 10 mL/hr at 04/05/15 0400  . DOPamine Stopped (04/04/15 0900)  . lactated ringers    . lactated ringers    . milrinone 0.25 mcg/kg/min (04/05/15 0400)  . nitroGLYCERIN Stopped (04/05/15  0600)  . phenylephrine (NEO-SYNEPHRINE) Adult infusion Stopped (04/03/15 2200)   PRN Meds:.sodium chloride, sodium chloride, sodium chloride, alteplase, heparin, lidocaine (PF), lidocaine-prilocaine, metoprolol, midazolam, morphine injection, ondansetron (ZOFRAN) IV, oxyCODONE, pentafluoroprop-tetrafluoroeth, sodium chloride, traMADol  General appearance: alert, cooperative and no distress Neurologic: intact Heart: regular rate and rhythm, S1, S2 normal, no murmur, click, rub or gallop Lungs: diminished breath sounds bibasilar Abdomen: soft, non-tender; bowel sounds normal; no masses,  no organomegaly Extremities: extremities normal, atraumatic, no cyanosis or edema and Homans sign is negative, no sign of DVT Wound: sterum intact  Lab Results: CBC: Recent Labs  04/04/15 1646 04/05/15 0400  WBC 15.6* 13.8*  HGB 8.8* 7.7*  HCT 25.0* 22.1*  PLT 234 186   BMET:  Recent Labs  04/04/15 0410  04/04/15 1558 04/04/15 1840 04/04/15 1847 04/05/15 0400  NA 134* 136  --   --  133*  K 5.0 3.6 4.4  --  4.6  CL 100* 101  --   --  96*  CO2 19*  --   --   --  25  GLUCOSE 105* 104*  --   --  214*  BUN 60* 31*  --   --  36*  CREATININE 9.83* 5.80*  --  5.96* 7.58*  CALCIUM 8.2*  --   --   --  8.3*    PT/INR:  Recent Labs  04/03/15 1415  LABPROT 17.5*  INR 1.43   Radiology: No results found.   Assessment/Plan: S/P Procedure(s) (LRB): CORONARY ARTERY BYPASS GRAFTING (CABG) x 4 (LIMA to LAD, SVG to DIAGONAL, SVG to OM1, and SVG to RCA) with EVH from right greater saphenous thigh and partial lower leg vein  (N/A) TRANSESOPHAGEAL ECHOCARDIOGRAM (TEE) (N/A) Mobilize Diabetes control d/c tubes/lines dialysis today  Resume bp meds and d/c milrinone     [name] 04/05/2015 8:47 AM

## 2015-04-05 NOTE — Progress Notes (Addendum)
[facility] Progress Note   Subjective: 2.5 kg off with HD yesterday.  CXR today basilar atx, no edema. Off NTG drip.    Filed Vitals:   04/05/15 1100 04/05/15 1200 04/05/15 1300 04/05/15 1400  BP: 96/56 95/55 125/52 118/54  Pulse: 102 98 97 95  Temp:      TempSrc:      Resp: 15 17 21 16   Height:      Weight:      SpO2: 100% 100% 99% 98%   Exam: General: intubated post CABG Heart: RRR no mur , rub or gallop Lungs: intubated on vent CTA Abdomen: NT , soft Extremities:no pedal edema Dialysis Access: pos bruit L U A AVF     OP HD  MWF [facility]) 4hr edw 95 kg 2k.2.5 ca LA AVGG HEParin 3000, Venofer 100mg  q tx start 10/19 end 04/23/15/ Mircera 75  q 2wks last on 03/17/15   Calcitriol  1.0 mcg po q hd  oplabs =pth 774 03/26/15  /phos 7 ca 9 /hgb 8.8 03/26/15  Assessment: 1 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 , sp  CABG 10/20 2 ESRD HD mwf 3 HTN longstanding (on norvasc/ coreg/ clon/ losartan at home)- off IV NTG now, started clonidine but is responding too well, will dc so we have some BP to get vol down 4 Vol - had him down to 90 kg prior to CABG (prior edw 95kg), should keep that as goal 5 DM2 on insulin 6 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/ phoslo/ ferric citrate po vit d when pos 7 Anemia Hb 7's, give darbe 100 today then q Wed, observe continue Fe load as op   Plan - HD today, get vol down further, stopped clonidine  Kelly Splinter MD [facility] pager [phone]    cell [phone] 04/05/2015, 11:08 AM   Recent Labs Lab 03/31/15 0353  04/01/15 1710 04/02/15 0746  04/03/15 1645  04/04/15 0410 04/04/15 1558 04/04/15 1840 04/04/15 1847 04/05/15 0400  NA 135  < > 129* 135  < > 134*  < > 134* 136  --   --  133*  K 4.2  < > 4.6 4.2  < > 4.4  < > 5.0 3.6 4.4  --  4.6  CL 97*  < > 89* 89*  < > 103  < > 100* 101  --   --  96*  CO2 24  < > 24 27  < > 22  --  19*  --   --   --  25  GLUCOSE 189*  < > 264* 125*  < > 137*  < > 105* 104*  --   --  214*  BUN 87*  < >  74* 90*  < > 51*  < > 60* 31*  --   --  36*  CREATININE 13.68*  < > 11.73* 13.03*  < > 8.57*  < > 9.83* 5.80*  --  5.96* 7.58*  CALCIUM 8.9  < > 9.0 9.3  < > 7.0*  --  8.2*  --   --   --  8.3*  PHOS 6.3*  --  6.1* 6.4*  --   --   --   --   --   --   --   --   < > = values in this interval not displayed.  Recent Labs Lab 04/01/15 0711 04/01/15 1710 04/02/15 0746 04/03/15 0604  AST 29  --   --  15  ALT 18  --   --  14*  ALKPHOS 48  --   --  42  BILITOT 0.6  --   --  0.4  PROT 6.8  --   --  5.9*  ALBUMIN 3.2* 3.3* 3.1* 2.9*    Recent Labs Lab 03/30/15 2333  04/02/15 0746  04/04/15 0410 04/04/15 1558 04/04/15 1646 04/05/15 0400  WBC 12.8*  < > 8.4  < > 14.5*  --  15.6* 13.8*  NEUTROABS 10.6*  --  5.8  --   --   --   --   --   HGB 10.2*  < > 8.8*  < > 9.0* 7.8* 8.8* 7.7*  HCT 30.0*  < > 25.3*  < > 26.3* 23.0* 25.0* 22.1*  MCV 94.9  < > 92.3  < > 90.4  --  89.3 91.3  PLT 233  < > 215  < > 215  --  234 186  < > = values in this interval not displayed. Marland Kitchen acetaminophen  1,000 mg Oral 4 times per day   Or  . acetaminophen (TYLENOL) oral liquid 160 mg/5 mL  1,000 mg Per Tube 4 times per day  . antiseptic oral rinse  7 mL Mouth Rinse QID  . aspirin EC  325 mg Oral Daily   Or  . aspirin  324 mg Per Tube Daily  . atorvastatin  80 mg Oral q1800  . bisacodyl  10 mg Oral Daily   Or  . bisacodyl  10 mg Rectal Daily  . calcium acetate  667 mg Oral Q breakfast  . cefUROXime (ZINACEF)  IV  1.5 g Intravenous Q24 Hr x 2  . cefUROXime (ZINACEF)  IV  1.5 g Intravenous Once  . chlorhexidine  15 mL Mouth/Throat BID  . cholecalciferol  1,000 Units Oral Daily  . darbepoetin (ARANESP) injection - DIALYSIS  100 mcg Intravenous Once  . [START ON 04/09/2015] darbepoetin (ARANESP) injection - DIALYSIS  100 mcg Intravenous Q Wed-HD  . docusate sodium  200 mg Oral Daily  . ferric gluconate (FERRLECIT/NULECIT) IV  125 mg Intravenous Q T,Th,Sa-HD  . insulin aspart  0-24 Units Subcutaneous 6 times  per day  . insulin detemir  20 Units Subcutaneous BID  . metoprolol tartrate  12.5 mg Oral BID   Or  . metoprolol tartrate  12.5 mg Per Tube BID  . multivitamin with minerals   Oral Daily  . pantoprazole  40 mg Oral Daily  . sodium chloride  10 mL Intravenous Q12H  . sodium chloride  3 mL Intravenous Q12H   . sodium chloride Stopped (04/04/15 1100)  . sodium chloride    . sodium chloride 10 mL/hr at 04/05/15 0400  . lactated ringers    . lactated ringers    . nitroGLYCERIN Stopped (04/05/15 0600)  . phenylephrine (NEO-SYNEPHRINE) Adult infusion Stopped (04/03/15 2200)   sodium chloride, sodium chloride, sodium chloride, sodium chloride, sodium chloride, alteplase, alteplase, heparin, lidocaine (PF), lidocaine (PF), lidocaine-prilocaine, lidocaine-prilocaine, metoprolol, midazolam, morphine injection, ondansetron (ZOFRAN) IV, oxyCODONE, pentafluoroprop-tetrafluoroeth, sodium chloride, traMADol

## 2015-04-06 ENCOUNTER — Inpatient Hospital Stay (HOSPITAL_COMMUNITY): Payer: Medicare Other

## 2015-04-06 LAB — TYPE AND SCREEN
ABO/RH(D): B POS
Antibody Screen: NEGATIVE
Unit division: 0
Unit division: 0
Unit division: 0
Unit division: 0
Unit division: 0
Unit division: 0

## 2015-04-06 LAB — CBC
HCT: 22.1 % — ABNORMAL LOW (ref 39.0–52.0)
Hemoglobin: 7.3 g/dL — ABNORMAL LOW (ref 13.0–17.0)
MCH: 30.8 pg (ref 26.0–34.0)
MCHC: 33 g/dL (ref 30.0–36.0)
MCV: 93.2 fL (ref 78.0–100.0)
Platelets: 179 10*3/uL (ref 150–400)
RBC: 2.37 MIL/uL — ABNORMAL LOW (ref 4.22–5.81)
RDW: 16.6 % — ABNORMAL HIGH (ref 11.5–15.5)
WBC: 10.4 10*3/uL (ref 4.0–10.5)

## 2015-04-06 LAB — GLUCOSE, CAPILLARY
Glucose-Capillary: 103 mg/dL — ABNORMAL HIGH (ref 65–99)
Glucose-Capillary: 142 mg/dL — ABNORMAL HIGH (ref 65–99)
Glucose-Capillary: 178 mg/dL — ABNORMAL HIGH (ref 65–99)
Glucose-Capillary: 187 mg/dL — ABNORMAL HIGH (ref 65–99)
Glucose-Capillary: 203 mg/dL — ABNORMAL HIGH (ref 65–99)
Glucose-Capillary: 252 mg/dL — ABNORMAL HIGH (ref 65–99)

## 2015-04-06 LAB — BASIC METABOLIC PANEL
Anion gap: 9 (ref 5–15)
BUN: 32 mg/dL — ABNORMAL HIGH (ref 6–20)
CO2: 27 mmol/L (ref 22–32)
Calcium: 8.4 mg/dL — ABNORMAL LOW (ref 8.9–10.3)
Chloride: 96 mmol/L — ABNORMAL LOW (ref 101–111)
Creatinine, Ser: 6.92 mg/dL — ABNORMAL HIGH (ref 0.61–1.24)
GFR calc Af Amer: 9 mL/min — ABNORMAL LOW (ref >60)
GFR calc non Af Amer: 8 mL/min — ABNORMAL LOW (ref >60)
Glucose, Bld: 142 mg/dL — ABNORMAL HIGH (ref 65–99)
Potassium: 4 mmol/L (ref 3.5–5.1)
Sodium: 132 mmol/L — ABNORMAL LOW (ref 135–145)

## 2015-04-06 NOTE — Progress Notes (Signed)
Patient ID: [name], male   DOB: 05/29/1951, 64 y.o.   MRN: [id number] EVENING ROUNDS NOTE :     [address]       [place], [address]             [phone]                 3 Days Post-Op Procedure(s) (LRB): CORONARY ARTERY BYPASS GRAFTING (CABG) x 4 (LIMA to LAD, SVG to DIAGONAL, SVG to OM1, and SVG to RCA) with EVH from right greater saphenous thigh and partial lower leg vein  (N/A) TRANSESOPHAGEAL ECHOCARDIOGRAM (TEE) (N/A)  Total Length of Stay:  LOS: 6 days  BP 138/50 mmHg  Pulse 87  Temp(Src) 100.9 F (38.3 C) (Oral)  Resp 18  Ht 6\' 5"  (1.956 m)  Wt 205 lb 7.5 oz (93.2 kg)  BMI 24.36 kg/m2  SpO2 99%  .Intake/Output      10/22 0701 - 10/23 0700 10/23 0701 - 10/24 0700   P.O. 1440 480   I.V. (mL/kg) 257 (2.8)    IV Piggyback 50    Total Intake(mL/kg) 1747 (18.7) 480 (5.2)   Urine (mL/kg/hr)     Emesis/NG output     Other 3500 (1.6)    Chest Tube 20 (0)    Total Output 3520     Net -1773 +480          . sodium chloride Stopped (04/04/15 1100)  . sodium chloride Stopped (04/06/15 0600)  . lactated ringers    . lactated ringers    . nitroGLYCERIN Stopped (04/05/15 0600)  . phenylephrine (NEO-SYNEPHRINE) Adult infusion Stopped (04/03/15 2200)     Lab Results  Component Value Date   WBC 10.4 04/06/2015   HGB 7.3* 04/06/2015   HCT 22.1* 04/06/2015   PLT 179 04/06/2015   GLUCOSE 142* 04/06/2015   CHOL 210* 04/01/2015   TRIG 68 04/01/2015   HDL 38* 04/01/2015   LDLCALC 158* 04/01/2015   ALT 14* 04/03/2015   AST 15 04/03/2015   NA 132* 04/06/2015   K 4.0 04/06/2015   CL 96* 04/06/2015   CREATININE 6.92* 04/06/2015   BUN 32* 04/06/2015   CO2 27 04/06/2015   INR 1.43 04/03/2015   HGBA1C 7.1* 04/03/2015   Better day, walking in hall  [name] MD  Beeper [phone] Office [phone] 04/06/2015 5:32 PM

## 2015-04-06 NOTE — Progress Notes (Signed)
[facility] Progress Note   Subjective: 3.5L off with HD yesterday, up in chair, sleepy but no SOB.      Filed Vitals:   04/06/15 0600 04/06/15 0700 04/06/15 0800 04/06/15 0900  BP:  122/52 136/50 105/73  Pulse:  92 89 91  Temp:  97 F (36.1 C)    TempSrc:  Oral    Resp: 21 16 18 20   Height:      Weight: 93.2 kg (205 lb 7.5 oz)     SpO2:  96% 99% 95%   Exam: General: up in chair, pleasant and oriented, a bit slow to respond Heart: RRR no mur , rub or gallop Lungs: clear bilat Abdomen: NT , soft +BS no ascites Ext: trace pretib edema Dialysis Access: pos bruit L U A AVF     OP HD  MWF [facility]) 4hr edw 95 kg 2k.2.5 ca LA AVGG HEParin 3000, Venofer 100mg  q tx start 10/19 end 04/23/15/ Mircera 75  q 2wks last on 03/17/15   Calcitriol  1.0 mcg po q hd  oplabs =pth 774 03/26/15  /phos 7 ca 9 /hgb 8.8 03/26/15  Assessment: 1 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 On low dose MTP only now (was on 4 BP meds at home).  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/ phoslo/ ferric citrate po vit d when pos 7 Anemia Hb 7's, ordered darbe 100/ wk, first dose Sat, observe continue Fe load as op   Plan - HD tomorrow  Kelly Splinter MD [place] pager [phone]    cell [phone] 04/05/2015, 11:08 AM   Recent Labs Lab 03/31/15 0353  04/01/15 1710 04/02/15 0746  04/04/15 0410 04/04/15 1558 04/04/15 1840 04/04/15 1847 04/05/15 0400 04/06/15 0416  NA 135  < > 129* 135  < > 134* 136  --   --  133* 132*  K 4.2  < > 4.6 4.2  < > 5.0 3.6 4.4  --  4.6 4.0  CL 97*  < > 89* 89*  < > 100* 101  --   --  96* 96*  CO2 24  < > 24 27  < > 19*  --   --   --  25 27  GLUCOSE 189*  < > 264* 125*  < > 105* 104*  --   --  214* 142*  BUN 87*  < > 74* 90*  < > 60* 31*  --   --  36* 32*  CREATININE 13.68*  < > 11.73* 13.03*  < > 9.83* 5.80*   --  5.96* 7.58* 6.92*  CALCIUM 8.9  < > 9.0 9.3  < > 8.2*  --   --   --  8.3* 8.4*  PHOS 6.3*  --  6.1* 6.4*  --   --   --   --   --   --   --   < > = values in this interval not displayed.  Recent Labs Lab 04/01/15 0711 04/01/15 1710 04/02/15 0746 04/03/15 0604  AST 29  --   --  15  ALT 18  --   --  14*  ALKPHOS 48  --   --  42  BILITOT 0.6  --   --  0.4  PROT 6.8  --   --  5.9*  ALBUMIN 3.2* 3.3* 3.1* 2.9*    Recent Labs Lab 03/30/15 2333  04/02/15 0746  04/04/15 1646 04/05/15 0400 04/06/15 0416  WBC 12.8*  < > 8.4  < > 15.6* 13.8* 10.4  NEUTROABS 10.6*  --  5.8  --   --   --   --   HGB 10.2*  < > 8.8*  < > 8.8* 7.7* 7.3*  HCT 30.0*  < > 25.3*  < > 25.0* 22.1* 22.1*  MCV 94.9  < > 92.3  < > 89.3 91.3 93.2  PLT 233  < > 215  < > 234 186 179  < > = values in this interval not displayed. Marland Kitchen acetaminophen  1,000 mg Oral 4 times per day   Or  . acetaminophen (TYLENOL) oral liquid 160 mg/5 mL  1,000 mg Per Tube 4 times per day  . aspirin EC  325 mg Oral Daily   Or  . aspirin  324 mg Per Tube Daily  . atorvastatin  80 mg Oral q1800  . bisacodyl  10 mg Oral Daily   Or  . bisacodyl  10 mg Rectal Daily  . calcium acetate  667 mg Oral Q breakfast  . cholecalciferol  1,000 Units Oral Daily  . [START ON 04/09/2015] darbepoetin (ARANESP) injection - DIALYSIS  100 mcg Intravenous Q Wed-HD  . docusate sodium  200 mg Oral Daily  . ferric gluconate (FERRLECIT/NULECIT) IV  125 mg Intravenous Q T,Th,Sa-HD  . insulin aspart  0-24 Units Subcutaneous 6 times per day  . insulin detemir  20 Units Subcutaneous BID  . metoprolol tartrate  12.5 mg Oral BID   Or  . metoprolol tartrate  12.5 mg Per Tube BID  . multivitamin  1 tablet Oral QHS  . pantoprazole  40 mg Oral Daily  . sodium chloride  10 mL Intravenous Q12H  . sodium chloride  3 mL Intravenous Q12H   . sodium chloride Stopped (04/04/15 1100)  . sodium chloride Stopped (04/06/15 0600)  . lactated ringers    . lactated ringers     . nitroGLYCERIN Stopped (04/05/15 0600)  . phenylephrine (NEO-SYNEPHRINE) Adult infusion Stopped (04/03/15 2200)   sodium chloride, sodium chloride, sodium chloride, sodium chloride, sodium chloride, alteplase, alteplase, heparin, lidocaine (PF), lidocaine (PF), lidocaine-prilocaine, lidocaine-prilocaine, metoprolol, morphine injection, ondansetron (ZOFRAN) IV, oxyCODONE, pentafluoroprop-tetrafluoroeth, sodium chloride, traMADol

## 2015-04-06 NOTE — Progress Notes (Signed)
Patient ID: [name], male   DOB: 02/13/1951, 64 y.o.   MRN: [id number] TCTS DAILY ICU PROGRESS NOTE                   [place]            [place] [address]          [phone]   3 Days Post-Op Procedure(s) (LRB): CORONARY ARTERY BYPASS GRAFTING (CABG) x 4 (LIMA to LAD, SVG to DIAGONAL, SVG to OM1, and SVG to RCA) with EVH from right greater saphenous thigh and partial lower leg vein  (N/A) TRANSESOPHAGEAL ECHOCARDIOGRAM (TEE) (N/A)  Total Length of Stay:  LOS: 6 days   Subjective: Feels better, dialysis yesterday  Objective: Vital signs in last 24 hours: Temp:  [97.8 F (36.6 C)-99.5 F (37.5 C)] 99.5 F (37.5 C) (10/23 0400) Pulse Rate:  [88-104] 92 (10/23 0700) Cardiac Rhythm:  [-] Normal sinus rhythm (10/23 0400) Resp:  [7-21] 16 (10/23 0700) BP: (95-150)/(51-60) 122/52 mmHg (10/23 0700) SpO2:  [95 %-100 %] 96 % (10/23 0700) Arterial Line BP: (107-156)/(41-60) 118/58 mmHg (10/22 1700) Weight:  [205 lb 7.5 oz (93.2 kg)-214 lb 11.7 oz (97.4 kg)] 205 lb 7.5 oz (93.2 kg) (10/23 0600)  Filed Weights   04/05/15 1400 04/05/15 1726 04/06/15 0600  Weight: 214 lb 11.7 oz (97.4 kg) 206 lb 2.1 oz (93.5 kg) 205 lb 7.5 oz (93.2 kg)    Weight change: 3.5 oz (0.1 kg)   Hemodynamic parameters for last 24 hours:    Intake/Output from previous day: 10/22 0701 - 10/23 0700 In: [id number] [P.O.:1440; I.V.:257; IV Piggyback:50] Out: [id number] [Chest Tube:20]  Intake/Output this shift:    Current Meds: Scheduled Meds: . acetaminophen  1,000 mg Oral 4 times per day   Or  . acetaminophen (TYLENOL) oral liquid 160 mg/5 mL  1,000 mg Per Tube 4 times per day  . aspirin EC  325 mg Oral Daily   Or  . aspirin  324 mg Per Tube Daily  . atorvastatin  80 mg Oral q1800  . bisacodyl  10 mg Oral Daily   Or  . bisacodyl  10 mg Rectal Daily  . calcium acetate  667 mg Oral Q breakfast  . cholecalciferol  1,000 Units Oral Daily  . [START ON 04/09/2015] darbepoetin  (ARANESP) injection - DIALYSIS  100 mcg Intravenous Q Wed-HD  . docusate sodium  200 mg Oral Daily  . ferric gluconate (FERRLECIT/NULECIT) IV  125 mg Intravenous Q T,Th,Sa-HD  . insulin aspart  0-24 Units Subcutaneous 6 times per day  . insulin detemir  20 Units Subcutaneous BID  . metoprolol tartrate  12.5 mg Oral BID   Or  . metoprolol tartrate  12.5 mg Per Tube BID  . multivitamin  1 tablet Oral QHS  . pantoprazole  40 mg Oral Daily  . sodium chloride  10 mL Intravenous Q12H  . sodium chloride  3 mL Intravenous Q12H   Continuous Infusions: . sodium chloride Stopped (04/04/15 1100)  . sodium chloride    . lactated ringers    . lactated ringers    . nitroGLYCERIN Stopped (04/05/15 0600)  . phenylephrine (NEO-SYNEPHRINE) Adult infusion Stopped (04/03/15 2200)   PRN Meds:.sodium chloride, sodium chloride, sodium chloride, sodium chloride, sodium chloride, alteplase, alteplase, heparin, lidocaine (PF), lidocaine (PF), lidocaine-prilocaine, lidocaine-prilocaine, metoprolol, morphine injection, ondansetron (ZOFRAN) IV, oxyCODONE, pentafluoroprop-tetrafluoroeth, sodium chloride, traMADol  General appearance: alert and cooperative Neurologic: intact Heart: regular rate and rhythm, S1, S2  normal, no murmur, click, rub or gallop Lungs: clear to auscultation bilaterally Abdomen: soft, non-tender; bowel sounds normal; no masses,  no organomegaly Extremities: extremities normal, atraumatic, no cyanosis or edema and Homans sign is negative, no sign of DVT Wound: sternum ok, palpable pulse in left upper arm graft  Lab Results: CBC: Recent Labs  04/05/15 0400 04/06/15 0416  WBC 13.8* 10.4  HGB 7.7* 7.3*  HCT 22.1* 22.1*  PLT 186 179   BMET:  Recent Labs  04/05/15 0400 04/06/15 0416  NA 133* 132*  K 4.6 4.0  CL 96* 96*  CO2 25 27  GLUCOSE 214* 142*  BUN 36* 32*  CREATININE 7.58* 6.92*  CALCIUM 8.3* 8.4*    PT/INR:  Recent Labs  04/03/15 1415  LABPROT 17.5*  INR 1.43    Radiology: No results found.   Assessment/Plan: S/P Procedure(s) (LRB): CORONARY ARTERY BYPASS GRAFTING (CABG) x 4 (LIMA to LAD, SVG to DIAGONAL, SVG to OM1, and SVG to RCA) with EVH from right greater saphenous thigh and partial lower leg vein  (N/A) TRANSESOPHAGEAL ECHOCARDIOGRAM (TEE) (N/A) Mobilize dialysis per Renal service      [name] 04/06/2015 8:20 AM

## 2015-04-07 LAB — GLUCOSE, CAPILLARY
Glucose-Capillary: 105 mg/dL — ABNORMAL HIGH (ref 65–99)
Glucose-Capillary: 164 mg/dL — ABNORMAL HIGH (ref 65–99)
Glucose-Capillary: 249 mg/dL — ABNORMAL HIGH (ref 65–99)
Glucose-Capillary: 46 mg/dL — ABNORMAL LOW (ref 65–99)
Glucose-Capillary: 67 mg/dL (ref 65–99)
Glucose-Capillary: 74 mg/dL (ref 65–99)
Glucose-Capillary: 78 mg/dL (ref 65–99)
Glucose-Capillary: 85 mg/dL (ref 65–99)
Glucose-Capillary: 98 mg/dL (ref 65–99)

## 2015-04-07 LAB — PREPARE RBC (CROSSMATCH)

## 2015-04-07 MED ORDER — LIDOCAINE HCL (PF) 1 % IJ SOLN: 5.0000 mL | INTRAMUSCULAR | Status: DC | PRN

## 2015-04-07 MED ORDER — SODIUM CHLORIDE 0.9 % IJ SOLN: 3.0000 mL | INTRAMUSCULAR | Status: DC | PRN

## 2015-04-07 MED ORDER — INSULIN ASPART 100 UNIT/ML ~~LOC~~ SOLN
0.0000 [IU] | Freq: Three times a day (TID) | SUBCUTANEOUS | Status: DC
  Administered 2015-04-08: 4 [IU] via SUBCUTANEOUS
  Administered 2015-04-08: 2 [IU] via SUBCUTANEOUS
  Filled 2015-04-07: qty 0.24

## 2015-04-07 MED ORDER — SODIUM CHLORIDE 0.9 % IV SOLN: 100.0000 mL | INTRAVENOUS | Status: DC | PRN

## 2015-04-07 MED ORDER — METOPROLOL TARTRATE 25 MG PO TABS
25.0000 mg | ORAL_TABLET | Freq: Two times a day (BID) | ORAL | Status: DC
  Administered 2015-04-07 – 2015-04-10 (×6): 25 mg via ORAL
  Filled 2015-04-07 (×7): qty 1

## 2015-04-07 MED ORDER — MOVING RIGHT ALONG BOOK
Freq: Once | Status: DC
  Filled 2015-04-07: qty 1

## 2015-04-07 MED ORDER — ALUM & MAG HYDROXIDE-SIMETH 200-200-20 MG/5ML PO SUSP: 15.0000 mL | ORAL | Status: DC | PRN

## 2015-04-07 MED ORDER — SODIUM CHLORIDE 0.9 % IV SOLN
125.0000 mg | INTRAVENOUS | Status: DC
  Administered 2015-04-07 – 2015-04-09 (×2): 125 mg via INTRAVENOUS
  Filled 2015-04-07 (×4): qty 10

## 2015-04-07 MED ORDER — LIDOCAINE-PRILOCAINE 2.5-2.5 % EX CREA: 1.0000 "application " | TOPICAL_CREAM | CUTANEOUS | Status: DC | PRN

## 2015-04-07 MED ORDER — SODIUM CHLORIDE 0.9 % IJ SOLN
3.0000 mL | Freq: Two times a day (BID) | INTRAMUSCULAR | Status: DC
  Administered 2015-04-07 – 2015-04-09 (×3): 3 mL via INTRAVENOUS

## 2015-04-07 MED ORDER — MAGNESIUM HYDROXIDE 400 MG/5ML PO SUSP
30.0000 mL | Freq: Every day | ORAL | Status: DC | PRN
Start: 2015-04-07 — End: 2015-04-09
  Administered 2015-04-07: 30 mL via ORAL
  Filled 2015-04-07: qty 30

## 2015-04-07 MED ORDER — DEXTROSE 50 % IV SOLN
INTRAVENOUS | Status: AC
  Filled 2015-04-07: qty 50

## 2015-04-07 MED ORDER — PENTAFLUOROPROP-TETRAFLUOROETH EX AERO: 1.0000 "application " | INHALATION_SPRAY | CUTANEOUS | Status: DC | PRN

## 2015-04-07 MED ORDER — INSULIN ASPART 100 UNIT/ML ~~LOC~~ SOLN
0.0000 [IU] | Freq: Every day | SUBCUTANEOUS | Status: DC
Start: 2015-04-07 — End: 2015-04-07

## 2015-04-07 MED ORDER — HEPARIN SODIUM (PORCINE) 1000 UNIT/ML DIALYSIS: 1000.0000 [IU] | INTRAMUSCULAR | Status: DC | PRN

## 2015-04-07 MED ORDER — LACTULOSE 10 GM/15ML PO SOLN
30.0000 g | Freq: Every day | ORAL | Status: DC
  Administered 2015-04-07: 30 g via ORAL
  Administered 2015-04-10: 10 g via ORAL
  Filled 2015-04-07 (×3): qty 45

## 2015-04-07 MED ORDER — ALTEPLASE 2 MG IJ SOLR: 2.0000 mg | Freq: Once | INTRAMUSCULAR | Status: DC | PRN

## 2015-04-07 MED ORDER — CALCITRIOL 0.5 MCG PO CAPS
1.0000 ug | ORAL_CAPSULE | ORAL | Status: DC
  Administered 2015-04-07 – 2015-04-09 (×2): 1 ug via ORAL
  Filled 2015-04-07 (×3): qty 2

## 2015-04-07 MED ORDER — DARBEPOETIN ALFA 100 MCG/0.5ML IJ SOSY: 100.0000 ug | PREFILLED_SYRINGE | INTRAMUSCULAR | Status: DC

## 2015-04-07 MED ORDER — SODIUM CHLORIDE 0.9 % IV SOLN: 250.0000 mL | INTRAVENOUS | Status: DC | PRN

## 2015-04-07 MED ORDER — HEPARIN SODIUM (PORCINE) 1000 UNIT/ML DIALYSIS: 20.0000 [IU]/kg | Freq: Once | INTRAMUSCULAR | Status: DC

## 2015-04-07 MED ORDER — DEXTROSE 50 % IV SOLN
25.0000 mL | Freq: Once | INTRAVENOUS | Status: AC
  Administered 2015-04-07: 25 mL via INTRAVENOUS

## 2015-04-07 MED ORDER — INSULIN ASPART 100 UNIT/ML ~~LOC~~ SOLN: 0.0000 [IU] | Freq: Three times a day (TID) | SUBCUTANEOUS | Status: DC

## 2015-04-07 NOTE — Progress Notes (Signed)
S: Pain controlled, eating better.  Bowels have yet to move O:BP 142/52 mmHg  Pulse 78  Temp(Src) 98 F (36.7 C) (Oral)  Resp 10  Ht 6\' 5"  (1.956 m)  Wt 92.08 kg (203 lb)  BMI 24.07 kg/m2  SpO2 100%  Intake/Output Summary (Last 24 hours) at 04/07/15 0813 Last data filed at 04/07/15 0600  Gross per 24 hour  Intake    960 ml  Output      0 ml  Net    960 ml   Weight change: -5.32 kg (-11 lb 11.7 oz) [id number] and alert CVS:RRR Resp:decreased BS bases Abd:+ BS NTND Ext: Tr edema.  LUA AVG + bruit NEURO:CNI Ox3 no asterixis   . acetaminophen  1,000 mg Oral 4 times per day   Or  . acetaminophen (TYLENOL) oral liquid 160 mg/5 mL  1,000 mg Per Tube 4 times per day  . aspirin EC  325 mg Oral Daily   Or  . aspirin  324 mg Per Tube Daily  . atorvastatin  80 mg Oral q1800  . bisacodyl  10 mg Oral Daily   Or  . bisacodyl  10 mg Rectal Daily  . calcium acetate  667 mg Oral Q breakfast  . cholecalciferol  1,000 Units Oral Daily  . [START ON 04/09/2015] darbepoetin (ARANESP) injection - DIALYSIS  100 mcg Intravenous Q Wed-HD  . docusate sodium  200 mg Oral Daily  . ferric gluconate (FERRLECIT/NULECIT) IV  125 mg Intravenous Q T,Th,Sa-HD  . insulin aspart  0-15 Units Subcutaneous TID WC  . insulin aspart  0-5 Units Subcutaneous QHS  . insulin detemir  20 Units Subcutaneous BID  . metoprolol tartrate  25 mg Oral BID  . multivitamin  1 tablet Oral QHS  . pantoprazole  40 mg Oral Daily  . sodium chloride  10 mL Intravenous Q12H  . sodium chloride  3 mL Intravenous Q12H   Dg Chest Port 1 View  04/06/2015  CLINICAL DATA:  Post CABG 04/03/2015 EXAM: PORTABLE CHEST 1 VIEW COMPARISON:  Portable exam 0524 hours compared to 04/05/2015 FINDINGS: Interval removal of LEFT thoracostomy tube. RIGHT jugular central venous catheter with tip projecting over RIGHT brachiocephalic vein; catheter is kinked at the skin surface. LEFT subclavian central venous catheter with tip projecting over RIGHT  atrium. Epicardial pacing wires noted. Enlargement of cardiac silhouette post CABG. Minimal pneumomediastinum. Pulmonary vascularity normal. Bibasilar atelectasis. Tiny LEFT apex pneumothorax post thoracostomy tube removal unchanged. No acute osseous findings. IMPRESSION: Persistent tiny LEFT apex pneumothorax and minimal pneumomediastinum. Postsurgical changes of CABG with decreased lung volumes and bibasilar atelectasis. Electronically Signed   By: Lavonia Dana M.D.   On: 04/06/2015 08:41   BMET    Component Value Date/Time   NA 132* 04/06/2015 0416   K 4.0 04/06/2015 0416   CL 96* 04/06/2015 0416   CO2 27 04/06/2015 0416   GLUCOSE 142* 04/06/2015 0416   BUN 32* 04/06/2015 0416   CREATININE 6.92* 04/06/2015 0416   CALCIUM 8.4* 04/06/2015 0416   CALCIUM 8.3* 01/14/2014 1708   GFRNONAA 8* 04/06/2015 0416   GFRAA 9* 04/06/2015 0416   CBC    Component Value Date/Time   WBC 10.4 04/06/2015 0416   RBC 2.37* 04/06/2015 0416   HGB 7.3* 04/06/2015 0416   HCT 22.1* 04/06/2015 0416   PLT 179 04/06/2015 0416   MCV 93.2 04/06/2015 0416   MCH 30.8 04/06/2015 0416   MCHC 33.0 04/06/2015 0416   RDW 16.6* 04/06/2015 0416  LYMPHSABS 2.0 04/02/2015 0746   MONOABS 0.5 04/02/2015 0746   EOSABS 0.1 04/02/2015 0746   BASOSABS 0.0 04/02/2015 0746     Assessment:  1. ESRD on HD [place] MWF 2. SP CABG 3. HTN 4. DM 5. Anemia on aranesp 6. Sec HPTH   Plan: 1. Plan HD today 2. Says he is on sensipar but does not know the dose, will get info from his dialysis unit   Early Steel T

## 2015-04-07 NOTE — Progress Notes (Signed)
4 Days Post-Op Procedure(s) (LRB): CORONARY ARTERY BYPASS GRAFTING (CABG) x 4 (LIMA to LAD, SVG to DIAGONAL, SVG to OM1, and SVG to RCA) with EVH from right greater saphenous thigh and partial lower leg vein  (N/A) TRANSESOPHAGEAL ECHOCARDIOGRAM (TEE) (N/A) Subjective: CABG x 4 Unstable post infarction angina  Objective: Vital signs in last 24 hours: Temp:  [97.5 F (36.4 C)-100.9 F (38.3 C)] 98 F (36.7 C) (10/24 0723) Pulse Rate:  [70-92] 83 (10/24 0900) Cardiac Rhythm:  [-] Normal sinus rhythm (10/24 0800) Resp:  [8-22] 22 (10/24 0900) BP: (107-170)/(44-66) 170/52 mmHg (10/24 0900) SpO2:  [91 %-100 %] 97 % (10/24 0900) Weight:  [203 lb (92.08 kg)] 203 lb (92.08 kg) (10/24 0600)  Hemodynamic parameters for last 24 hours:  nsr  Intake/Output from previous day: 10/23 0701 - 10/24 0700 In: 960 [P.O.:960] Out: -  Intake/Output this shift: Total I/O In: 240 [P.O.:240] Out: -   Lungs clear Neuro intact Abdomen soft  Lab Results:  Recent Labs  04/05/15 0400 04/06/15 0416  WBC 13.8* 10.4  HGB 7.7* 7.3*  HCT 22.1* 22.1*  PLT 186 179   BMET:  Recent Labs  04/05/15 0400 04/06/15 0416  NA 133* 132*  K 4.6 4.0  CL 96* 96*  CO2 25 27  GLUCOSE 214* 142*  BUN 36* 32*  CREATININE 7.58* 6.92*  CALCIUM 8.3* 8.4*    PT/INR: No results for input(s): LABPROT, INR in the last 72 hours. ABG    Component Value Date/Time   PHART 7.484* 04/05/2015 0354   HCO3 25.3* 04/05/2015 0354   TCO2 26 04/05/2015 0354   ACIDBASEDEF 6.0* 04/04/2015 1207   O2SAT 97.0 04/05/2015 0354   CBG (last 3)   Recent Labs  04/07/15 0538 04/07/15 0610 04/07/15 0721  GLUCAP 74 98 249*    Assessment/Plan: S/P Procedure(s) (LRB): CORONARY ARTERY BYPASS GRAFTING (CABG) x 4 (LIMA to LAD, SVG to DIAGONAL, SVG to OM1, and SVG to RCA) with EVH from right greater saphenous thigh and partial lower leg vein  (N/A) TRANSESOPHAGEAL ECHOCARDIOGRAM (TEE) (N/A) Mobilize Diabetes control Plan  for transfer to step-down: see transfer orders transfuse for expected postop blood loss anemia   LOS: 7 days    Tharon Aquas Trigt III 04/07/2015

## 2015-04-07 NOTE — Progress Notes (Signed)
Hypoglycemic Event  CBG: 47  Treatment: D50 IV 25 mL  Symptoms: Sweaty  Follow-up CBG: Time:0500 CBG Result:85  Possible Reasons for Event: Medication regimen: Pt receiving Levimir 20u bid, did not eat all off his food with meals  Comments/MD notified:Will be notified with am rounds    Rosaura Carpenter

## 2015-04-08 ENCOUNTER — Inpatient Hospital Stay (HOSPITAL_COMMUNITY): Payer: Medicare Other

## 2015-04-08 LAB — BASIC METABOLIC PANEL
Anion gap: 12 (ref 5–15)
BUN: 31 mg/dL — ABNORMAL HIGH (ref 6–20)
CO2: 28 mmol/L (ref 22–32)
Calcium: 8.2 mg/dL — ABNORMAL LOW (ref 8.9–10.3)
Chloride: 95 mmol/L — ABNORMAL LOW (ref 101–111)
Creatinine, Ser: 6.88 mg/dL — ABNORMAL HIGH (ref 0.61–1.24)
GFR calc Af Amer: 9 mL/min — ABNORMAL LOW (ref >60)
GFR calc non Af Amer: 8 mL/min — ABNORMAL LOW (ref >60)
Glucose, Bld: 153 mg/dL — ABNORMAL HIGH (ref 65–99)
Potassium: 4 mmol/L (ref 3.5–5.1)
Sodium: 135 mmol/L (ref 135–145)

## 2015-04-08 LAB — GLUCOSE, CAPILLARY
Glucose-Capillary: 96 mg/dL (ref 65–99)
Glucose-Capillary: 121 mg/dL — ABNORMAL HIGH (ref 65–99)
Glucose-Capillary: 185 mg/dL — ABNORMAL HIGH (ref 65–99)
Glucose-Capillary: 136 mg/dL — ABNORMAL HIGH (ref 65–99)

## 2015-04-08 LAB — CBC
HCT: 22.3 % — ABNORMAL LOW (ref 39.0–52.0)
Hemoglobin: 7.4 g/dL — ABNORMAL LOW (ref 13.0–17.0)
MCH: 30.5 pg (ref 26.0–34.0)
MCHC: 33.2 g/dL (ref 30.0–36.0)
MCV: 91.8 fL (ref 78.0–100.0)
Platelets: 209 10*3/uL (ref 150–400)
RBC: 2.43 MIL/uL — ABNORMAL LOW (ref 4.22–5.81)
RDW: 17.4 % — ABNORMAL HIGH (ref 11.5–15.5)
WBC: 7.6 10*3/uL (ref 4.0–10.5)

## 2015-04-08 MED ORDER — CLONIDINE HCL 0.1 MG PO TABS
0.1000 mg | ORAL_TABLET | Freq: Three times a day (TID) | ORAL | Status: DC
  Administered 2015-04-08 – 2015-04-10 (×7): 0.1 mg via ORAL
  Filled 2015-04-08 (×7): qty 1

## 2015-04-08 MED ORDER — CINACALCET HCL 30 MG PO TABS
30.0000 mg | ORAL_TABLET | Freq: Two times a day (BID) | ORAL | Status: DC
  Administered 2015-04-08 – 2015-04-10 (×4): 30 mg via ORAL
  Filled 2015-04-08 (×4): qty 1

## 2015-04-08 MED ORDER — AMLODIPINE BESYLATE 10 MG PO TABS
10.0000 mg | ORAL_TABLET | Freq: Every day | ORAL | Status: DC
  Administered 2015-04-08 – 2015-04-10 (×3): 10 mg via ORAL
  Filled 2015-04-08 (×3): qty 1

## 2015-04-08 NOTE — Discharge Instructions (Signed)
Endoscopic Saphenous Vein Harvesting, Care After °Refer to this sheet in the next few weeks. These instructions provide you with information on caring for yourself after your procedure. Your health care provider may also give you more specific instructions. Your treatment has been planned according to current medical practices, but problems sometimes occur. Call your health care provider if you have any problems or questions after your procedure. °HOME CARE INSTRUCTIONS °Medicine °· Take whatever pain medicine your surgeon prescribes. Follow the directions carefully. Do not take over-the-counter pain medicine unless your surgeon says it is okay. Some pain medicine can cause bleeding problems for several weeks after surgery. °· Follow your surgeon's instructions about driving. You will probably not be permitted to drive after heart surgery. °· Take any medicines your surgeon prescribes. Any medicines you took before your heart surgery should be checked with your health care provider before you start taking them again. °Wound care °· If your surgeon has prescribed an elastic bandage or stocking, ask how long you should wear it. °· Check the area around your surgical cuts (incisions) whenever your bandages (dressings) are changed. Look for any redness or swelling. °· You will need to return to have the stitches (sutures) or staples taken out. Ask your surgeon when to do that. °· Ask your surgeon when you can shower or bathe. °Activity °· Try to keep your legs raised when you are sitting. °· Do any exercises your health care providers have given you. These may include deep breathing exercises, coughing, walking, or other exercises. °SEEK MEDICAL CARE IF: °· You have any questions about your medicines. °· You have more leg pain, especially if your pain medicine stops working. °· New or growing bruises develop on your leg. °· Your leg swells, feels tight, or becomes red. °· You have numbness in your leg. °SEEK IMMEDIATE  MEDICAL CARE IF: °· Your pain gets much worse. °· Blood or fluid leaks from any of the incisions. °· Your incisions become warm, swollen, or red. °· You have chest pain. °· You have trouble breathing. °· You have a fever. °· You have more pain near your leg incision. °MAKE SURE YOU: °· Understand these instructions. °· Will watch your condition. °· Will get help right away if you are not doing well or get worse. °  °This information is not intended to replace advice given to you by your health care provider. Make sure you discuss any questions you have with your health care provider. °  °Document Released: 02/10/2011 Document Revised: 06/21/2014 Document Reviewed: 02/10/2011 °Elsevier Interactive Patient Education ©2016 [employer] °Coronary Artery Bypass Grafting, Care After °These instructions give you information on caring for yourself after your procedure. Your doctor may also give you more specific instructions. Call your doctor if you have any problems or questions after your procedure.  °HOME CARE °· Only take medicine as told by your doctor. Take medicines exactly as told. Do not stop taking medicines or start any new medicines without talking to your doctor first. °· Take your pulse as told by your doctor. °· Do deep breathing as told by your doctor. Use your breathing device (incentive spirometer), if given, to practice deep breathing several times a day. Support your chest with a pillow or your arms when you take deep breaths or cough. °· Keep the area clean, dry, and protected where the surgery cuts (incisions) were made. Remove bandages (dressings) only as told by your doctor. If strips were applied to surgical area, do not take   them off. They fall off on their own. °· Check the surgery area daily for puffiness (swelling), redness, or leaking fluid. °· If surgery cuts were made in your legs: °· Avoid crossing your legs. °· Avoid sitting for long periods of time. Change positions every 30  minutes. °· Raise your legs when you are sitting. Place them on pillows. °· Wear stockings that help keep blood clots from forming in your legs (compression stockings). °· Only take sponge baths until your doctor says it is okay to take showers. Pat the surgery area dry. Do not rub the surgery area with a washcloth or towel. Do not bathe, swim, or use a hot tub until your doctor says it is okay. °· Eat foods that are high in fiber. These include raw fruits and vegetables, whole grains, beans, and nuts. Choose lean meats. Avoid canned, processed, and fried foods. °· Drink enough fluids to keep your pee (urine) clear or pale yellow. °· Weigh yourself every day. °· Rest and limit activity as told by your doctor. You may be told to: °· Stop any activity if you have chest pain, shortness of breath, changes in heartbeat, or dizziness. Get help right away if this happens. °· Move around often for short amounts of time or take short walks as told by your doctor. Gradually become more active. You may need help to strengthen your muscles and build endurance. °· Avoid lifting, pushing, or pulling anything heavier than 10 pounds (4.5 kg) for at least 6 weeks after surgery. °· Do not drive until your doctor says it is okay. °· Ask your doctor when you can go back to work. °· Ask your doctor when you can begin sexual activity again. °· Follow up with your doctor as told. °GET HELP IF: °· You have puffiness, redness, more pain, or fluid draining from the incision site. °· You have a fever. °· You have puffiness in your ankles or legs. °· You have pain in your legs. °· You gain 2 or more pounds (0.9 kg) a day. °· You feel sick to your stomach (nauseous) or throw up (vomit). °· You have watery poop (diarrhea). °GET HELP RIGHT AWAY IF: °· You have chest pain that goes to your jaw or arms. °· You have shortness of breath. °· You have a fast or irregular heartbeat. °· You notice a "clicking" in your breastbone when you move. °· You  have numbness or weakness in your arms or legs. °· You feel dizzy or light-headed. °MAKE SURE YOU: °· Understand these instructions. °· Will watch your condition. °· Will get help right away if you are not doing well or get worse. °  °This information is not intended to replace advice given to you by your health care provider. Make sure you discuss any questions you have with your health care provider. °  °Document Released: 06/05/2013 Document Reviewed: 06/05/2013 °Elsevier Interactive Patient Education ©2016 [employer] Inc. ° °

## 2015-04-08 NOTE — Progress Notes (Signed)
S: Did not feel fever.  Has been up walking.  Feels well O:BP 180/78 mmHg  Pulse 86  Temp(Src) 98.7 F (37.1 C) (Oral)  Resp 20  Ht 6\' 5"  (1.956 m)  Wt 95.8 kg (211 lb 3.2 oz)  BMI 25.04 kg/m2  SpO2 96%  Intake/Output Summary (Last 24 hours) at 04/08/15 0735 Last data filed at 04/07/15 1859  Gross per 24 hour  Intake    575 ml  Output   2000 ml  Net  -1425 ml   Weight change: 0.12 kg (4.2 oz) [id number] and alert CVS:RRR Resp:decreased BS bases Abd:+ BS NTND Ext: No edema.  LUA AVG + bruit NEURO:CNI Ox3 no asterixis Rt triple lumen cath   . acetaminophen  1,000 mg Oral 4 times per day   Or  . acetaminophen (TYLENOL) oral liquid 160 mg/5 mL  1,000 mg Per Tube 4 times per day  . aspirin EC  325 mg Oral Daily   Or  . aspirin  324 mg Per Tube Daily  . atorvastatin  80 mg Oral q1800  . bisacodyl  10 mg Oral Daily   Or  . bisacodyl  10 mg Rectal Daily  . calcitRIOL  1 mcg Oral Q M,W,F-1800  . calcium acetate  667 mg Oral Q breakfast  . cholecalciferol  1,000 Units Oral Daily  . [START ON 04/14/2015] darbepoetin (ARANESP) injection - DIALYSIS  100 mcg Intravenous Q Mon-HD  . docusate sodium  200 mg Oral Daily  . ferric gluconate (FERRLECIT/NULECIT) IV  125 mg Intravenous Q M,W,F-HD  . insulin aspart  0-24 Units Subcutaneous TID AC & HS  . insulin detemir  20 Units Subcutaneous BID  . lactulose  30 g Oral Daily  . metoprolol tartrate  25 mg Oral BID  . moving right along book   Does not apply Once  . multivitamin  1 tablet Oral QHS  . pantoprazole  40 mg Oral Daily  . sodium chloride  10 mL Intravenous Q12H  . sodium chloride  3 mL Intravenous Q12H  . sodium chloride  3 mL Intravenous Q12H   No results found. BMET    Component Value Date/Time   NA 135 04/08/2015 0408   K 4.0 04/08/2015 0408   CL 95* 04/08/2015 0408   CO2 28 04/08/2015 0408   GLUCOSE 153* 04/08/2015 0408   BUN 31* 04/08/2015 0408   CREATININE 6.88* 04/08/2015 0408   CALCIUM 8.2* 04/08/2015 0408    CALCIUM 8.3* 01/14/2014 1708   GFRNONAA 8* 04/08/2015 0408   GFRAA 9* 04/08/2015 0408   CBC    Component Value Date/Time   WBC 7.6 04/08/2015 0408   RBC 2.43* 04/08/2015 0408   HGB 7.4* 04/08/2015 0408   HCT 22.3* 04/08/2015 0408   PLT 209 04/08/2015 0408   MCV 91.8 04/08/2015 0408   MCH 30.5 04/08/2015 0408   MCHC 33.2 04/08/2015 0408   RDW 17.4* 04/08/2015 0408   LYMPHSABS 2.0 04/02/2015 0746   MONOABS 0.5 04/02/2015 0746   EOSABS 0.1 04/02/2015 0746   BASOSABS 0.0 04/02/2015 0746     Assessment:  1. ESRD on HD [place] MWF 2. SP CABG 3. HTN, BP high 4. DM 5. Anemia on aranesp 6. 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 HD in AM 2. Resume sensipar 3. Resume bp meds 4. Rec pull triple lumen  Ules Marsala T

## 2015-04-08 NOTE — Progress Notes (Signed)
EPW removed per policy and procedure. Pt tolerated well and vital signs collected. Pt instructed to stay on bedrest for an hour.

## 2015-04-08 NOTE — Care Management Important Message (Signed)
Important Message  Patient Details  Name: [name] MRN: [id number] Date of Birth: [birth year]   Medicare Important Message Given:  Yes-third notification given    Nathen May 04/08/2015, 1:50 PM

## 2015-04-08 NOTE — Progress Notes (Addendum)
      [address]       [place] [address]             [phone]        5 Days Post-Op Procedure(s) (LRB): CORONARY ARTERY BYPASS GRAFTING (CABG) x 4 (LIMA to LAD, SVG to DIAGONAL, SVG to OM1, and SVG to RCA) with EVH from right greater saphenous thigh and partial lower leg vein  (N/A) TRANSESOPHAGEAL ECHOCARDIOGRAM (TEE) (N/A)  Subjective: Patient with bowel movement this am. No specific complaints  Objective: Vital signs in last 24 hours: Temp:  [98.3 F (36.8 C)-101.7 F (38.7 C)] 98.7 F (37.1 C) (10/25 0525) Pulse Rate:  [78-98] 86 (10/25 0525) Cardiac Rhythm:  [-] Normal sinus rhythm (10/24 1928) Resp:  [10-24] 20 (10/25 0525) BP: (132-187)/(52-85) 180/78 mmHg (10/25 0525) SpO2:  [93 %-100 %] 96 % (10/25 0525) Weight:  [200 lb 6.4 oz (90.9 kg)-211 lb 3.2 oz (95.8 kg)] 211 lb 3.2 oz (95.8 kg) (10/25 0525)  Pre op weight 90 kg Current Weight  04/08/15 211 lb 3.2 oz (95.8 kg)      Intake/Output from previous day: 10/24 0701 - 10/25 0700 In: 575 [P.O.:240; Blood:335] Out: 2000 [Urine:300]   Physical Exam:  Cardiovascular: RRR, no murmurs, gallops, or rubs. Pulmonary: Slightly diminished at bases; no rales, wheezes, or rhonchi. Abdomen: Soft, non tender, bowel sounds present. Extremities: Mild bilateral lower extremity edema. Wounds: Clean and dry.  No erythema or signs of infection.  Lab Results: CBC: Recent Labs  04/06/15 0416 04/08/15 0408  WBC 10.4 7.6  HGB 7.3* 7.4*  HCT 22.1* 22.3*  PLT 179 209   BMET:  Recent Labs  04/06/15 0416 04/08/15 0408  NA 132* 135  K 4.0 4.0  CL 96* 95*  CO2 27 28  GLUCOSE 142* 153*  BUN 32* 31*  CREATININE 6.92* 6.88*  CALCIUM 8.4* 8.2*    PT/INR:  Lab Results  Component Value Date   INR 1.43 04/03/2015   INR 1.19 04/01/2015   INR 1.11 03/30/2015   ABG:  INR: Will add last result for INR, ABG once components are confirmed Will add last 4 CBG results once components are  confirmed  Assessment/Plan: 1. CV - S/p NSTEMI. SR in the 80's. Hypertensive. On Lopressor 25 mg bid. Will restart Clonidine 0.1 mg tid and Norvasc 10 mg daily. 2.  Pulmonary - On room air. CXR this am appears to show bibasilar atelectasis and possible trace left apical pneumothorax. Encourage incentive spirometer and flutter valve. 3.  Acute blood loss anemia - H and H stable at 7.4 and 22.3. On Aranesp and Nulecit. 4. Fever to 101.7 yesterday. WBC "WNL" at [place].Has right subclavian central line. Will remove after obtain peripheral. WIll obtain blood cultures if above 101 again. 5.ESRD-creatinine 6.88. HD per nephrology (M/W/Fri)  6. DM-CBGs 164/105/96. On Insulin. Pre op HGA1C 7.1. 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 Remove EPW  ZIMMERMAN,DONIELLE MPA-C 04/08/2015,7:29 AM  DC central line Increase mobility- reduce atelectasis follow fever and culture for Temp > 101 Home thurs if no more fever  patient examined and medical record reviewed,agree with above note. Tharon Aquas Trigt III 04/08/2015

## 2015-04-08 NOTE — Discharge Summary (Signed)
Physician Discharge Summary  Patient ID: [name] MRN: [id number] DOB/AGE: 1950-09-15 64 y.o.  Admit date: 03/30/2015 Discharge date: 04/10/2015  Admission Diagnoses: Shortness of breath  Discharge Diagnoses:  Principal Problem:   NSTEMI (non-ST elevated myocardial infarction) [facility]) Active Problems:   Type 2 diabetes mellitus with diabetic nephropathy ([place])   Essential hypertension   Acute on chronic renal failure (HCC)   Fluid overload   SOB (shortness of breath)   Volume overload   ESRD on dialysis ([place])   Secondary hypertension, unspecified   Acute diastolic heart failure (HCC)   CAD (coronary artery disease)  Patient Active Problem List   Diagnosis Date Noted  . CAD (coronary artery disease)   . Secondary hypertension, unspecified   . Acute diastolic heart failure ([place])   . SOB (shortness of breath) 03/31/2015  . Volume overload 03/31/2015  . NSTEMI (non-ST elevated myocardial infarction) ([place]) 03/31/2015  . ESRD on dialysis ([place])   . Hypoxia 03/21/2015  . Fluid overload 03/21/2015  . End stage renal disease ([place]) 03/07/2014  . Chronic kidney disease 01/29/2014  . Acute on chronic renal failure ([place]) 01/14/2014  . Anemia of chronic disease 01/14/2014  . Hyponatremia 01/14/2014  . Hyperkalemia 01/14/2014  . Type 2 diabetes mellitus with diabetic nephropathy ([place]) 09/02/2009  . HYPERLIPIDEMIA 09/02/2009  . Essential hypertension 09/02/2009  . Coronary atherosclerosis 09/02/2009    HPI: At time of presentation to the emergency department [name] is an 64 y.o. male with hx of ESRD on HD, IDDM, CAD s/p PCI, presented to the ER with SOB. CXR showed vascular congestion, and he was found to be volume overload. He was given NTP, and labetelol for his HTN. He has no chest pain, fever or chills. Work up in the ER showed normal K, and Cr near 13. He needs urgent dialysis. Dr Burnett Sheng was consulted by EDP, and he came in tonight to see patient, and planned  for emergent dialysis. He was given neb tx and oxygen, and his breathing has improved. Hospitalist was asked to admit him for same  The patient was found to have elevated troponin and cardiology consultation was obtained. He did rule in for non-STEMI. He was transferred to [facility] cone for cardiac catheterization.   Discharged Condition: good  Hospital Course: The patient was admitted and underwent emergent dialysis under the care of the nephrologist. Creatinine was 13.6 and troponin was 13.87.  the troponin peaked at 14.4. He also developed acute pulmonary edema with associated respiratory failure. Cardiac catheterization was done 03/22/2015 and revealed severe three-vessel disease. He was felt to be a candidate for coronary artery surgical revascularization in consultation with Tharon Aquas Trigt MD. he was medically stabilized and proceeded with surgery on 04/03/2015. He tolerated the procedure well and was taken to the surgical intensive care unit in stable condition. He was extubated without difficulty using standard protocols. Inotropic support was weaned over time. All routine lines, monitors and drainage devices have been discontinued in the standard fashion. Dialysis was managed per the nephrologist. Oxygen has been weaned and he maintains adequate saturations on room air. He does have bibasilar atelectasis which is improving. He doesn't have a ongoing acute blood loss anemia associated with chronic anemia which is being managed with Aranesp and nulecit. Blood sugars are stable with good control on insulin and sliding scale. He is tolerating gradually increasing activities using standard rehabilitation protocols. He is maintaining normal sinus rhythm with some hypertension. Blood pressure medications are being adjusted and he is currently  on Lopressor, clonidine and Norvasc. His blood pressure is still elevated so he was restarted on Losartan 50 mg daily. He did have a fever to 101.7 on 10/24. WBC was  within normal limits, no signs of wound infection, and no GU complaints. He did have a central line, which was removed. He was mostly afebrile thereafter, but did have a low grade fever (99.5) yesterday.  His WBC today is again normal (8500). Per Dr. Prescott Gum, home health is to be arranged and he is surgically stable for discharge today.  Disposition:Stable and discharged to home  Consults: cardiology and nephrology  Significant Diagnostic Studies: angiography: cardiac cath  Treatments: cardiac cath                       Surgery  DATE OF PROCEDURE: 04/03/2015 DATE OF DISCHARGE:   OPERATIVE REPORT   OPERATION: 1. Coronary artery bypass grafting x4 (left internal mammary artery to  LAD, saphenous vein graft to first diagonal, saphenous vein graft  to OM1, saphenous vein graft to posterior descending). 2. Endoscopic harvest of right leg greater saphenous vein.  SURGEON: Ivin Poot, M.D.  ASSISTANT: Lars Pinks, PA-C.  ANESTHESIA: General by Dr. Lillia Abed.  PREOPERATIVE DIAGNOSIS: 1. Severe 3-vessel coronary artery disease. 2. Non-ST-elevation myocardial infarction. 3. Chronic renal failure, on hemodialysis.  POSTOPERATIVE DIAGNOSIS: 1. Severe 3-vessel coronary artery disease. 2. Non-ST-elevation myocardial infarction. 3. Chronic renal failure, on hemodialysis.  Discharge Exam: Blood pressure 166/67, pulse 74, temperature 98.2 F (36.8 C), temperature source Oral, resp. rate 18, height 6\' 5"  (1.956 m), weight 210 lb 8.6 oz (95.5 kg), SpO2 92 %.  Cardiovascular: RRR, no murmurs, gallops, or rubs. Pulmonary: Mostly clear; no rales, wheezes, or rhonchi. Abdomen: Soft, non tender, bowel sounds present. Extremities: Mild bilateral lower extremity edema. Wounds: Clean and dry. No erythema or signs of infection.  Disposition: 01-Home or Self Care  Discharge Instructions    Amb Referral to Cardiac Rehabilitation     Complete by:  As directed   Diagnosis:   Myocardial Infarction CABG             Medications at time of discharge:   Medication List    STOP taking these medications        carvedilol 25 MG tablet  Commonly known as:  COREG      TAKE these medications        amLODipine 10 MG tablet  Commonly known as:  NORVASC  Take 10 mg by mouth daily.     aspirin 325 MG EC tablet  Take 1 tablet (325 mg total) by mouth daily.     atorvastatin 80 MG tablet  Commonly known as:  LIPITOR  Take 1 tablet (80 mg total) by mouth daily at 6 PM.     AURYXIA 1 GM 210 MG(FE) Tabs  Generic drug:  Ferric Citrate  Take 1 tablet by mouth 3 (three) times daily with meals.     calcitRIOL 0.5 MCG capsule  Commonly known as:  ROCALTROL  Take 2 capsules (1 mcg total) by mouth every Monday, Wednesday, and Friday at 6 PM.     calcium acetate 667 MG capsule  Commonly known as:  PHOSLO  Take 667 mg by mouth daily.     Cholecalciferol 1000 UNITS tablet  Take 1,000 Units by mouth daily.     cinacalcet 30 MG tablet  Commonly known as:  SENSIPAR  Take 30 mg by mouth 2 (two) times daily with  a meal.     cloNIDine 0.1 MG tablet  Commonly known as:  CATAPRES  Take 0.1 mg by mouth 3 (three) times daily.     DAILY VITAMIN PO  Take 1 tablet by mouth daily.     insulin glargine 100 UNIT/ML injection  Commonly known as:  LANTUS  Inject 40 Units into the skin at bedtime.     losartan 100 MG tablet  Commonly known as:  COZAAR  Take 0.5 tablets (50 mg total) by mouth daily.     metoprolol tartrate 25 MG tablet  Commonly known as:  LOPRESSOR  Take 1 tablet (25 mg total) by mouth 2 (two) times daily.     oxyCODONE 5 MG immediate release tablet  Commonly known as:  Oxy IR/ROXICODONE  Take 1-2 tablets (5-10 mg total) by mouth every 3 (three) hours as needed for severe pain.        Follow-up Information    Follow up with Len Childs, MD.   Specialty:  Cardiothoracic Surgery   Why:   Appointment to see the surgeon on 05/14/2015 at 1 PM. Please obtain a chest x-ray and [place] imaging at 12:30 PM. [facility] imaging is located in the same office complex.   Contact information:   [address] [place] [address] [phone]       Follow up with Isaiah Serge, NP On 04/24/2015.   Specialties:  Cardiology, Radiology   Why:  @ 1:30pm   Contact information:   [place] [address] [phone]       Follow up with CLAGGETT,ELIN, PA-C.   Specialty:  Family Medicine   Why:  Call for a follow up appointment 1-2 months regarding further surveillance of HGA1C 7.1 and diabeters management   Contact information:   [address] [place] [address] [phone]      The patient has been discharged on:   1.Beta Blocker:  Yes Blue.Reese   ]                              No   [   ]                              If No, reason:  2.Ace Inhibitor/ARB: Yes [  y ]                                     No  [    ]                                     If No, reason:  3.Statin:   Yes Blue.Reese   ]                  No  [   ]                  If No, reason:  4.Ecasa:  Yes  [ y  ]                  No   [   ]  If No, reason:  Signed: Teresita Fanton M PA-C 04/10/2015, 9:11 AM

## 2015-04-08 NOTE — Plan of Care (Signed)
Problem: Phase I Progression Outcomes Goal: Anginal pain relieved Outcome: Progressing Pt has extensive surgery but has not complained of chest pain. Pt will be monitored Goal: Voiding-avoid urinary catheter unless indicated Outcome: Not Met (add Reason) Pt is a dialysis pt and does not void.

## 2015-04-08 NOTE — Progress Notes (Signed)
CARDIAC REHAB PHASE I   PRE:  Rate/Rhythm: 81 SR    BP: sitting 176/63    SaO2: 98 RA  MODE:  Ambulation: 350 ft   POST:  Rate/Rhythm: 98 SR    BP: sitting 211/80     SaO2: 100 RA  Pt with min assist to stand. Used RW independently with slow pace. To recliner after walk. BP very elevated. Recently received BP meds. RN aware.  [id number]   [name] [place] CES, ACSM 04/08/2015 10:37 AM

## 2015-04-09 LAB — CBC
HCT: 24.5 % — ABNORMAL LOW (ref 39.0–52.0)
Hemoglobin: 8.2 g/dL — ABNORMAL LOW (ref 13.0–17.0)
MCH: 31.4 pg (ref 26.0–34.0)
MCHC: 33.5 g/dL (ref 30.0–36.0)
MCV: 93.9 fL (ref 78.0–100.0)
Platelets: 224 10*3/uL (ref 150–400)
RBC: 2.61 MIL/uL — ABNORMAL LOW (ref 4.22–5.81)
RDW: 17.4 % — ABNORMAL HIGH (ref 11.5–15.5)
WBC: 8.6 10*3/uL (ref 4.0–10.5)

## 2015-04-09 LAB — GLUCOSE, CAPILLARY
Glucose-Capillary: 42 mg/dL — CL (ref 65–99)
Glucose-Capillary: 83 mg/dL (ref 65–99)
Glucose-Capillary: 87 mg/dL (ref 65–99)
Glucose-Capillary: 134 mg/dL — ABNORMAL HIGH (ref 65–99)
Glucose-Capillary: 95 mg/dL (ref 65–99)
Glucose-Capillary: 200 mg/dL — ABNORMAL HIGH (ref 65–99)
Glucose-Capillary: 218 mg/dL — ABNORMAL HIGH (ref 65–99)

## 2015-04-09 LAB — RENAL FUNCTION PANEL
Albumin: 2.3 g/dL — ABNORMAL LOW (ref 3.5–5.0)
Anion gap: 15 (ref 5–15)
BUN: 55 mg/dL — ABNORMAL HIGH (ref 6–20)
CO2: 25 mmol/L (ref 22–32)
Calcium: 8.5 mg/dL — ABNORMAL LOW (ref 8.9–10.3)
Chloride: 93 mmol/L — ABNORMAL LOW (ref 101–111)
Creatinine, Ser: 9.42 mg/dL — ABNORMAL HIGH (ref 0.61–1.24)
GFR calc Af Amer: 6 mL/min — ABNORMAL LOW (ref >60)
GFR calc non Af Amer: 5 mL/min — ABNORMAL LOW (ref >60)
Glucose, Bld: 77 mg/dL (ref 65–99)
Phosphorus: 4.7 mg/dL — ABNORMAL HIGH (ref 2.5–4.6)
Potassium: 4.1 mmol/L (ref 3.5–5.1)
Sodium: 133 mmol/L — ABNORMAL LOW (ref 135–145)

## 2015-04-09 LAB — PREPARE RBC (CROSSMATCH)

## 2015-04-09 MED ORDER — INSULIN ASPART 100 UNIT/ML ~~LOC~~ SOLN
0.0000 [IU] | Freq: Three times a day (TID) | SUBCUTANEOUS | Status: DC
Start: 2015-04-09 — End: 2015-04-10
  Administered 2015-04-10: 7 [IU] via SUBCUTANEOUS

## 2015-04-09 MED ORDER — INSULIN ASPART 100 UNIT/ML ~~LOC~~ SOLN: 0.0000 [IU] | Freq: Three times a day (TID) | SUBCUTANEOUS | Status: DC

## 2015-04-09 MED ORDER — INSULIN ASPART 100 UNIT/ML ~~LOC~~ SOLN: 0.0000 [IU] | Freq: Every day | SUBCUTANEOUS | Status: DC

## 2015-04-09 NOTE — Progress Notes (Signed)
Inpatient Diabetes Program Recommendations  AACE/ADA: New Consensus Statement on Inpatient Glycemic Control (2015)  Target Ranges:  Prepandial:   less than 140 mg/dL      Peak postprandial:   less than 180 mg/dL (1-2 hours)      Critically ill patients:  140 - 180 mg/dL   Review of Glycemic Control  Results for ETTORE, ERKKILA (MRN [id number]) as of 04/09/2015 13:05  Ref. Range 04/08/2015 06:03 04/08/2015 11:30 04/08/2015 16:11 04/08/2015 21:18 04/09/2015 05:59 04/09/2015 06:21 04/09/2015 07:17 04/09/2015 09:28 04/09/2015 11:38  Glucose-Capillary Latest Ref Range: 65-99 mg/dL 96 121 (H) 185 (H) 136 (H) 42 (LL) 83 87 134 (H) 95    Inpatient Diabetes Program Recommendations: (CRF on HD)  Please consider decrease in levemir to 15 units bid and decrease correction from resistant tidwc and use HS correction scale (req'd pharmacy to use HS scale at HS rather than the resistant correction scale at HS)  Thank you Rosita Kea, RN, MSN, CDE  Diabetes Inpatient Program Office: [phone] Pager: [phone] 8:00 am to 5:00 pm

## 2015-04-09 NOTE — Progress Notes (Signed)
Hypoglycemic Event  CBG: 42  Treatment: Nephro, applesauce, graham crackers  Symptoms: tingling in fingers  Follow-up CBG: Time: 06:20 CBG Result:83  Possible Reasons for Event: Levemir dosing, refused bedtime snack   Comments/MD notified:No    [name]

## 2015-04-09 NOTE — Progress Notes (Addendum)
      [address]       [place] [address]             [phone]        6 Days Post-Op Procedure(s) (LRB): CORONARY ARTERY BYPASS GRAFTING (CABG) x 4 (LIMA to LAD, SVG to DIAGONAL, SVG to OM1, and SVG to RCA) with EVH from right greater saphenous thigh and partial lower leg vein  (N/A) TRANSESOPHAGEAL ECHOCARDIOGRAM (TEE) (N/A)  Subjective: Patient on HD this am. No specific complaints  Objective: Vital signs in last 24 hours: Temp:  [97.9 F (36.6 C)-98.4 F (36.9 C)] 98.4 F (36.9 C) (10/26 0645) Pulse Rate:  [72-87] 81 (10/26 0730) Cardiac Rhythm:  [-] Bundle branch block;Heart block (10/25 1925) Resp:  [18-20] 20 (10/26 0645) BP: (130-191)/(55-85) 180/78 mmHg (10/26 0730) SpO2:  [95 %-99 %] 96 % (10/26 0645) Weight:  [216 lb 4.3 oz (98.1 kg)] 216 lb 4.3 oz (98.1 kg) (10/26 0502)  Pre op weight 90 kg Current Weight  04/09/15 216 lb 4.3 oz (98.1 kg)      Intake/Output from previous day: 10/25 0701 - 10/26 0700 In: 900 [P.O.:900] Out: -    Physical Exam:  Cardiovascular: RRR, no murmurs, gallops, or rubs. Pulmonary: Mostly clear; no rales, wheezes, or rhonchi. Abdomen: Soft, non tender, bowel sounds present. Extremities: Mild bilateral lower extremity edema. Wounds: Clean and dry.  No erythema or signs of infection.  Lab Results: CBC:  Recent Labs  04/08/15 0408  WBC 7.6  HGB 7.4*  HCT 22.3*  PLT 209   BMET:   Recent Labs  04/08/15 0408  NA 135  K 4.0  CL 95*  CO2 28  GLUCOSE 153*  BUN 31*  CREATININE 6.88*  CALCIUM 8.2*    PT/INR:  Lab Results  Component Value Date   INR 1.43 04/03/2015   INR 1.19 04/01/2015   INR 1.11 03/30/2015   ABG:  INR: Will add last result for INR, ABG once components are confirmed Will add last 4 CBG results once components are confirmed  Assessment/Plan: 1. CV - S/p NSTEMI. First degree HB. SR in the 80's. Still hypertensive. On Lopressor 25 mg bid, Clonidine 0.1 mg tid and Norvasc  10 mg daily. Was on Losartan pre op. Will resume if BP still elevated after HD. 2.  Pulmonary - On room air. Encourage incentive spirometer and flutter valve. 3.  Acute blood loss anemia - H and H stable at 7.4 and 22.3. On Aranesp and Nulecit. 4. No more fever since 101.7 on 10/24.WBC "WNL" at [place]. Will obtain blood cultures if above 101 again. 5.ESRD-creatinine 6.88. HD per nephrology (M/W/Fri)  6. DM-CBGs 164/105/96. On Insulin. Pre op HGA1C 7.1. 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 Possible discharge in am, if remains afebrile  ZIMMERMAN,DONIELLE MPA-C 04/09/2015,7:45 AM   Give an additional unit of packed cells today for hemoglobin 7.3 Plan discharge to home. Patient should have a home health nurse for restorative care  patient examined and medical record reviewed,agree with above note. Tharon Aquas Trigt III 04/09/2015

## 2015-04-09 NOTE — Progress Notes (Signed)
CARDIAC REHAB PHASE I   PRE:  Rate/Rhythm: 82 SR    BP: sitting 139/60    SaO2: 94 RA  MODE:  Ambulation: 350 ft   POST:  Rate/Rhythm: 92 SR    BP: sitting 136/62     SaO2: 91-92 RA  Pt needed assist to stand. Used RW without assist walking but sts he is sleepy and "in a fog". Received pain meds recently and also just back from HD. To bed after walk, VSS. [id number]   Josephina Shih [place] CES, [place] 04/09/2015 12:53 PM

## 2015-04-09 NOTE — Procedures (Signed)
Pt seen on HD.  Ap 140 Vp 220. BFR 450.  Tolerating HD well.  Note plans by surgeons to transfuse 1 unit.

## 2015-04-10 LAB — TYPE AND SCREEN
ABO/RH(D): B POS
Antibody Screen: NEGATIVE
Unit division: 0
Unit division: 0

## 2015-04-10 LAB — CBC
HCT: 25.6 % — ABNORMAL LOW (ref 39.0–52.0)
Hemoglobin: 8.6 g/dL — ABNORMAL LOW (ref 13.0–17.0)
MCH: 31.3 pg (ref 26.0–34.0)
MCHC: 33.6 g/dL (ref 30.0–36.0)
MCV: 93.1 fL (ref 78.0–100.0)
Platelets: 231 10*3/uL (ref 150–400)
RBC: 2.75 MIL/uL — ABNORMAL LOW (ref 4.22–5.81)
RDW: 17 % — ABNORMAL HIGH (ref 11.5–15.5)
WBC: 8.5 10*3/uL (ref 4.0–10.5)

## 2015-04-10 LAB — GLUCOSE, CAPILLARY
Glucose-Capillary: 184 mg/dL — ABNORMAL HIGH (ref 65–99)
Glucose-Capillary: 249 mg/dL — ABNORMAL HIGH (ref 65–99)

## 2015-04-10 MED ORDER — ASPIRIN 325 MG PO TBEC: 325.0000 mg | DELAYED_RELEASE_TABLET | Freq: Every day | ORAL | Status: DC

## 2015-04-10 MED ORDER — CALCITRIOL 0.5 MCG PO CAPS: 1.0000 ug | ORAL_CAPSULE | ORAL | Status: DC

## 2015-04-10 MED ORDER — ATORVASTATIN CALCIUM 80 MG PO TABS: 80.0000 mg | ORAL_TABLET | Freq: Every day | ORAL | Status: DC

## 2015-04-10 MED ORDER — METOPROLOL TARTRATE 25 MG PO TABS: 25.0000 mg | ORAL_TABLET | Freq: Two times a day (BID) | ORAL | Status: DC

## 2015-04-10 MED ORDER — LOSARTAN POTASSIUM 100 MG PO TABS: 50.0000 mg | ORAL_TABLET | Freq: Every day | ORAL | Status: DC

## 2015-04-10 MED ORDER — OXYCODONE HCL 5 MG PO TABS: 5.0000 mg | ORAL_TABLET | ORAL | Status: DC | PRN

## 2015-04-10 MED ORDER — LOSARTAN POTASSIUM 50 MG PO TABS
50.0000 mg | ORAL_TABLET | Freq: Every day | ORAL | Status: DC
Start: 2015-04-10 — End: 2015-04-10
  Administered 2015-04-10: 50 mg via ORAL
  Filled 2015-04-10: qty 1

## 2015-04-10 NOTE — Care Management Note (Signed)
Case Management Note  Patient Details  Name: [name] MRN: [id number] Date of Birth: 1950/11/29  Subjective/Objective:   Plan for dc today.  Will need HHRN and RW at home.  Discussed choice of [place] agencies with patient - Will set up with [facility] - Referral made.  RW order in and courtesy call made to [facility] to alert them of order.   Action/Plan:   Expected Discharge Date:                  Expected Discharge Plan:  Home/Self Care  In-House Referral:  NA  Discharge planning Services  CM Consult  Post Acute Care Choice:  NA, Durable Medical Equipment Choice offered to:  NA  DME Arranged:  [place] rolling DME Agency:  [place]:  RN [facility] Agency:     Status of Service:  Completed, signed off  Medicare Important Message Given:  Yes-third notification given Date Medicare IM Given:    Medicare IM give by:    Date Additional Medicare IM Given:    Additional Medicare Important Message give by:     If discussed at [place] of Stay Meetings, dates discussed:    Additional Comments:  Vergie Living, RN 04/10/2015, 9:39 AM

## 2015-04-10 NOTE — Progress Notes (Signed)
Ed completed. Pt voiced understanding and requests his name be sent to [facility] (although HD schedule might be a hindrance).  [place] CES, ACSM 8:34 AM 04/10/2015

## 2015-04-10 NOTE — Progress Notes (Signed)
Inpatient Diabetes Program Recommendations  AACE/ADA: New Consensus Statement on Inpatient Glycemic Control (2015)  Target Ranges:  Prepandial:   less than 140 mg/dL      Peak postprandial:   less than 180 mg/dL (1-2 hours)      Critically ill patients:  140 - 180 mg/dL   Review of Glycemic Control  Consider decreasing Levemir to 20 units once daily. Thank you  Raoul Pitch BSN, RN,CDE Inpatient Diabetes Coordinator [phone] (team pager)

## 2015-04-10 NOTE — Progress Notes (Signed)
S: Breathing better O:BP 166/67 mmHg  Pulse 74  Temp(Src) 98.2 F (36.8 C) (Oral)  Resp 18  Ht 6\' 5"  (1.956 m)  Wt 95.5 kg (210 lb 8.6 oz)  BMI 24.96 kg/m2  SpO2 92%  Intake/Output Summary (Last 24 hours) at 04/10/15 0707 Last data filed at 04/10/15 0300  Gross per 24 hour  Intake   1288 ml  Output   3800 ml  Net  -2512 ml   Weight change: -3.3 kg (-7 lb 4.4 oz) [id number] and alert CVS:RRR w 2/6 systolic M Resp: clear Abd:+ BS NTND Ext: No edema.  LUA AVG + bruit NEURO:CNI Ox3 no asterixis    . amLODipine  10 mg Oral Daily  . aspirin EC  325 mg Oral Daily   Or  . aspirin  324 mg Per Tube Daily  . atorvastatin  80 mg Oral q1800  . bisacodyl  10 mg Oral Daily   Or  . bisacodyl  10 mg Rectal Daily  . calcitRIOL  1 mcg Oral Q M,W,F-1800  . calcium acetate  667 mg Oral Q breakfast  . cholecalciferol  1,000 Units Oral Daily  . cinacalcet  30 mg Oral BID WC  . cloNIDine  0.1 mg Oral TID  . [START ON 04/14/2015] darbepoetin (ARANESP) injection - DIALYSIS  100 mcg Intravenous Q Mon-HD  . docusate sodium  200 mg Oral Daily  . ferric gluconate (FERRLECIT/NULECIT) IV  125 mg Intravenous Q M,W,F-HD  . insulin aspart  0-20 Units Subcutaneous TID WC  . insulin aspart  0-5 Units Subcutaneous QHS  . insulin detemir  20 Units Subcutaneous BID  . lactulose  30 g Oral Daily  . metoprolol tartrate  25 mg Oral BID  . moving right along book   Does not apply Once  . multivitamin  1 tablet Oral QHS  . pantoprazole  40 mg Oral Daily  . sodium chloride  10 mL Intravenous Q12H  . sodium chloride  3 mL Intravenous Q12H  . sodium chloride  3 mL Intravenous Q12H   No results found. BMET    Component Value Date/Time   NA 133* 04/09/2015 0700   K 4.1 04/09/2015 0700   CL 93* 04/09/2015 0700   CO2 25 04/09/2015 0700   GLUCOSE 77 04/09/2015 0700   BUN 55* 04/09/2015 0700   CREATININE 9.42* 04/09/2015 0700   CALCIUM 8.5* 04/09/2015 0700   CALCIUM 8.3* 01/14/2014 1708   GFRNONAA 5*  04/09/2015 0700   GFRAA 6* 04/09/2015 0700   CBC    Component Value Date/Time   WBC 8.5 04/10/2015 0216   RBC 2.75* 04/10/2015 0216   HGB 8.6* 04/10/2015 0216   HCT 25.6* 04/10/2015 0216   PLT 231 04/10/2015 0216   MCV 93.1 04/10/2015 0216   MCH 31.3 04/10/2015 0216   MCHC 33.6 04/10/2015 0216   RDW 17.0* 04/10/2015 0216   LYMPHSABS 2.0 04/02/2015 0746   MONOABS 0.5 04/02/2015 0746   EOSABS 0.1 04/02/2015 0746   BASOSABS 0.0 04/02/2015 0746     Assessment:  1. ESRD on HD [place] MWF 2. SP CABG 3. HTN, BP improving now that back on most of his BP meds 4. DM 5. Anemia on aranesp 6. 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 Note plan from yest for possible DC today 2.  Will plan HD in AM if not DC today   Almee Pelphrey T

## 2015-04-10 NOTE — Progress Notes (Addendum)
      [address]       [place] [address]             [phone]        7 Days Post-Op Procedure(s) (LRB): CORONARY ARTERY BYPASS GRAFTING (CABG) x 4 (LIMA to LAD, SVG to DIAGONAL, SVG to OM1, and SVG to RCA) with EVH from right greater saphenous thigh and partial lower leg vein  (N/A) TRANSESOPHAGEAL ECHOCARDIOGRAM (TEE) (N/A)  Subjective: Patient eating breakfast. He has no specific complaints and hopes to go home today.  Objective: Vital signs in last 24 hours: Temp:  [98.2 F (36.8 C)-99.5 F (37.5 C)] 98.2 F (36.8 C) (10/27 0524) Pulse Rate:  [73-86] 74 (10/27 0524) Cardiac Rhythm:  [-] Bundle branch block;Heart block (10/26 1938) Resp:  [18-20] 18 (10/27 0524) BP: (133-199)/(62-88) 166/67 mmHg (10/27 0524) SpO2:  [92 %-97 %] 92 % (10/26 2006) Weight:  [208 lb 15.9 oz (94.8 kg)-210 lb 8.6 oz (95.5 kg)] 210 lb 8.6 oz (95.5 kg) (10/27 0524)  Pre op weight 90 kg Current Weight  04/10/15 210 lb 8.6 oz (95.5 kg)      Intake/Output from previous day: 10/26 0701 - 10/27 0700 In: 1288 [P.O.:960; I.V.:3; Blood:325] Out: 3800    Physical Exam:  Cardiovascular: RRR, no murmurs, gallops, or rubs. Pulmonary: Mostly clear; no rales, wheezes, or rhonchi. Abdomen: Soft, non tender, bowel sounds present. Extremities: Mild bilateral lower extremity edema. Wounds: Clean and dry.  No erythema or signs of infection.  Lab Results: CBC:  Recent Labs  04/09/15 0700 04/10/15 0216  WBC 8.6 8.5  HGB 8.2* 8.6*  HCT 24.5* 25.6*  PLT 224 231   BMET:   Recent Labs  04/08/15 0408 04/09/15 0700  NA 135 133*  K 4.0 4.1  CL 95* 93*  CO2 28 25  GLUCOSE 153* 77  BUN 31* 55*  CREATININE 6.88* 9.42*  CALCIUM 8.2* 8.5*    PT/INR:  Lab Results  Component Value Date   INR 1.43 04/03/2015   INR 1.19 04/01/2015   INR 1.11 03/30/2015   ABG:  INR: Will add last result for INR, ABG once components are confirmed Will add last 4 CBG results once  components are confirmed  Assessment/Plan: 1. CV - S/p NSTEMI. First degree HB. SR in the 70's. Still hypertensive. On Lopressor 25 mg bid, Clonidine 0.1 mg tid and Norvasc 10 mg daily. Was on Losartan pre op so will restart at lower dose for better BP control. 2.  Pulmonary - On room air. Encourage incentive spirometer and flutter valve. 3.  Acute blood loss anemia - H and H stable at 8.6 and 25.6. On Aranesp and Nulecit. 4. Had low grade fever to 99.5 yesterday at 11 am but afebrile since then.WBC "WNL" at 8500. 5.ESRD-creatinine 6.88. HD per nephrology (M/W/Fri)  6. DM-CBGs 164/105/96. On Insulin. Pre op HGA1C 7.1. 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 Per Dr. Prescott Gum, may be discharged with home health  Zadiel Leyh MPA-C 04/10/2015,7:53 AM

## 2015-04-12 DIAGNOSIS — Z48812 Encounter for surgical aftercare following surgery on the circulatory system: Secondary | ICD-10-CM | POA: Diagnosis not present

## 2015-04-24 ENCOUNTER — Encounter: Payer: Self-pay | Admitting: Cardiology

## 2015-04-24 ENCOUNTER — Ambulatory Visit (INDEPENDENT_AMBULATORY_CARE_PROVIDER_SITE_OTHER): Payer: Medicare Other | Admitting: Cardiology

## 2015-04-24 VITALS — BP 156/42 | HR 75 | Ht 77.0 in | Wt 214.2 lb

## 2015-04-24 DIAGNOSIS — Z951 Presence of aortocoronary bypass graft: Secondary | ICD-10-CM

## 2015-04-24 DIAGNOSIS — I150 Renovascular hypertension: Secondary | ICD-10-CM

## 2015-04-24 DIAGNOSIS — I779 Disorder of arteries and arterioles, unspecified: Secondary | ICD-10-CM

## 2015-04-24 DIAGNOSIS — Z992 Dependence on renal dialysis: Secondary | ICD-10-CM

## 2015-04-24 DIAGNOSIS — I2510 Atherosclerotic heart disease of native coronary artery without angina pectoris: Secondary | ICD-10-CM | POA: Diagnosis not present

## 2015-04-24 DIAGNOSIS — N186 End stage renal disease: Secondary | ICD-10-CM

## 2015-04-24 DIAGNOSIS — E785 Hyperlipidemia, unspecified: Secondary | ICD-10-CM

## 2015-04-24 DIAGNOSIS — I214 Non-ST elevation (NSTEMI) myocardial infarction: Secondary | ICD-10-CM

## 2015-04-24 DIAGNOSIS — I739 Peripheral vascular disease, unspecified: Secondary | ICD-10-CM

## 2015-04-24 NOTE — Patient Instructions (Addendum)
Medication Instructions:  Your physician has recommended you make the following change in your medication:  1. Stop Metoprolol   Labwork: Your physician recommends that you return for a FASTING lipid profile: in 6 weeks on December 20 between 8-5.   Testing/Procedures: -None  Follow-Up: Your physician recommends that you keep your scheduled  follow-up appointment in January with Dr. Johnsie Cancel. Your physician wants you to follow-up in: 1 year for Carotid dopplers. You will receive a reminder letter in the mail two months in advance. If you don't receive a letter, please call our office to schedule the follow-up appointment.   Any Other Special Instructions Will Be Listed Below (If Applicable).     If you need a refill on your cardiac medications before your next appointment, please call your pharmacy.

## 2015-04-24 NOTE — Progress Notes (Signed)
Cardiology Office Note   Date:  04/24/2015   ID:  [name], DOB Dec 02, 1950, MRN [id number]  PCP:  Geroge Baseman  Cardiologist:    Dr. Johnsie Cancel   Chief Complaint  Patient presents with  . Hospitalization Follow-up    post MI and CABG      History of Present Illness: [name] is a 64 y.o. male who presents for post hospitalization   known history of CAD, s/p PCI to LAD in 2006 at [facility], who has been followed by [facility], Dr. Romie Jumper, Talbert Cage, (last noted in 2013) per Care Everywhere. He had nuclear stress test which revealed ischemia and probable subtle scare involving the apical lateral, basal anterolateral and mid anterolateral segments per his last note dated 03/15/2012. Other history includes ESRD on dialysis, diabetes and hypertension.   Recent admit with respiratory distress and pulmonary edema.  He has chronic RBBB his troponins were elevated at 13.87.  Cardiac cath done with 90% LAD in stent stenosis and mLAD at 70%.  1st diag with 90%, mRCA at 80%, mLCX at 80% normal LV function.  On 04/03/15 pt underwent CABG X 4 (LIMA to LAD, SVG to DIAGONAL, SVG to OM1, and SVG to RCA) with EVH from right greater saphenous thigh and partial lower leg vein.  Pt did well post surgery and was discharged on 04/10/15.  He had no a fib post op.    Today doing well, no complaints, some chest soreness with movement no fever, no SOB.  Walks about a mile a day.  His appetite is good.  On meds review he is taking both lopressor and coreg.  I stopped lopressor. Possible palpitations "but may be nerves".  Past Medical History  Diagnosis Date  . Hypertension   . Diabetes mellitus without complication ([place])   . Kidney infection   . MI (mitral incompetence)   . CAD (coronary artery disease)     STEMI with LAD stent by C Granger in 2005  . Seasonal allergies   . Depression   . History of kidney stones   . Arthritis   . Myocardial infarction [facility])     Past Surgical  History  Procedure Laterality Date  . Back surgery    . Coronary angioplasty with stent placement  2008  . Spine surgery    . Eye surgery Right     lens implant  . Colonoscopy w/ polypectomy    . Insertion of dialysis catheter Right 01/30/2014    Procedure: INSERTION OF DIALYSIS CATHETER-RIGHT INTERNAL JUGULAR;  Surgeon: Elam Dutch, MD;  Location: [place];  Service: Vascular;  Laterality: Right;  . Av fistula placement Left 01/30/2014    Procedure: INSERTION OF ARTERIOVENOUS (AV) GORE-TEX GRAFT LEFT UPPER ARM;  Surgeon: Elam Dutch, MD;  Location: [place];  Service: Vascular;  Laterality: Left;  . Cardiac catheterization N/A 04/01/2015    Procedure: Right/Left Heart Cath and Coronary Angiography;  Surgeon: Leonie Man, MD;  Location: [place] [facility];  Service: Cardiovascular;  Laterality: N/A;  . Coronary artery bypass graft N/A 04/03/2015    Procedure: CORONARY ARTERY BYPASS GRAFTING (CABG) x 4 (LIMA to LAD, SVG to DIAGONAL, SVG to OM1, and SVG to RCA) with EVH from right greater saphenous thigh and partial lower leg vein ;  Surgeon: Ivin Poot, MD;  Location: [place];  Service: Open Heart Surgery;  Laterality: N/A;  . Tee without cardioversion N/A 04/03/2015    Procedure: TRANSESOPHAGEAL ECHOCARDIOGRAM (TEE);  Surgeon: Collier Salina  Prescott Gum, MD;  Location: [place];  Service: Open Heart Surgery;  Laterality: N/A;     Current Outpatient Prescriptions  Medication Sig Dispense Refill  . amLODipine (NORVASC) 10 MG tablet Take 10 mg by mouth daily.     Marland Kitchen aspirin EC 325 MG EC tablet Take 1 tablet (325 mg total) by mouth daily. 30 tablet 0  . atorvastatin (LIPITOR) 80 MG tablet Take 1 tablet (80 mg total) by mouth daily at 6 PM. 30 tablet 0  . calcitRIOL (ROCALTROL) 0.5 MCG capsule Take 2 capsules (1 mcg total) by mouth every Monday, Wednesday, and Friday at 6 PM. 30 capsule 1  . carvedilol (COREG) 25 MG tablet Take 1 tablet by mouth 2 (two) times daily.    . Cholecalciferol 1000 UNITS  tablet Take 1,000 Units by mouth daily.    . cinacalcet (SENSIPAR) 30 MG tablet Take 30 mg by mouth 2 (two) times daily with a meal.    . cloNIDine (CATAPRES) 0.1 MG tablet Take 0.1 mg by mouth 3 (three) times daily.     . insulin glargine (LANTUS) 100 UNIT/ML injection Inject 40 Units into the skin at bedtime.     Marland Kitchen losartan (COZAAR) 100 MG tablet Take 0.5 tablets (50 mg total) by mouth daily. 30 tablet 1  . metoprolol tartrate (LOPRESSOR) 25 MG tablet Take 1 tablet (25 mg total) by mouth 2 (two) times daily. 60 tablet 1  . Multiple Vitamin (DAILY VITAMIN PO) Take 1 tablet by mouth daily.    Marland Kitchen NITROSTAT 0.4 MG SL tablet Place 0.4 mg under the tongue every 5 (five) minutes as needed for chest pain.     Marland Kitchen oxyCODONE (OXY IR/ROXICODONE) 5 MG immediate release tablet Take 1-2 tablets (5-10 mg total) by mouth every 3 (three) hours as needed for severe pain. 30 tablet 0   No current facility-administered medications for this visit.    Allergies:   Shellfish allergy    Social History:  The patient  reports that he has never smoked. He quit smokeless tobacco use about 4 years ago. His smokeless tobacco use included Chew. He reports that he does not drink alcohol or use illicit drugs.   Family History:  The patient's family history includes Diabetes in his father, mother, and other; Hypertension in his father and mother.    ROS:  General:no colds or fevers, gradual weight increase. Skin:no rashes or ulcers HEENT:no blurred vision, no congestion CV:see HPI PUL:see HPI GI:no diarrhea constipation or melena, no indigestion GU:no hematuria, no dysuria MS:no joint pain, no claudication Neuro:no syncope, no lightheadedness Endo:+ diabetes stable, no thyroid disease  Wt Readings from Last 3 Encounters:  04/24/15 214 lb 3.2 oz (97.16 kg)  04/10/15 210 lb 8.6 oz (95.5 kg)  03/21/15 207 lb 7.3 oz (94.1 kg)     PHYSICAL EXAM: VS:  BP 156/42 mmHg  Pulse 75  Ht 6\' 5"  (1.956 m)  Wt 214 lb 3.2 oz  (97.16 kg)  BMI 25.40 kg/m2 , BMI Body mass index is 25.4 kg/(m^2). General:Pleasant affect, NAD Skin:Warm and dry, brisk capillary refill HEENT:normocephalic, sclera clear, mucus membranes moist Neck:supple, no JVD, + rt carotid bruits  Heart:S1S2 RRR with soft systolic murmur, no gallup, rub or click- chest wall incision without drainage or redness.  Well approximated.  Lungs:clear without rales, rhonchi, or wheezes [id number], non tender, + BS, do not palpate liver spleen or masses Ext:1+ lower ext edema,  2+ radial pulses Neuro:alert and oriented X 3, MAE, follows commands, +  facial symmetry    EKG:  EKG is ordered today. The ekg ordered today demonstrates SR with first degree block and RBBB PR is 224 ms.  No acute changes from EKG post op.    Recent Labs: 03/21/2015: B Natriuretic Peptide 569.0* 04/03/2015: ALT 14* 04/04/2015: Magnesium 2.2 04/09/2015: BUN 55*; Creatinine, Ser 9.42*; Potassium 4.1; Sodium 133* 04/10/2015: Hemoglobin 8.6*; Platelets 231    Lipid Panel    Component Value Date/Time   CHOL 210* 04/01/2015 0140   TRIG 68 04/01/2015 0140   HDL 38* 04/01/2015 0140   CHOLHDL 5.5 04/01/2015 0140   VLDL 14 04/01/2015 0140   LDLCALC 158* 04/01/2015 0140       Other studies Reviewed: Additional studies/ records that were reviewed today include:   Cardiac cath: 1. Ost LAD to Prox LAD lesion, 90% stenosed. The lesion was previously treated with a stent (unknown type) in 2006. Distal stent edge: Mid LAD lesion, 70% stenosed. 2. Ost 1st Diag to 1st Diag lesion, 90% stenosed. Vessel jailed by prior stent. 3. Prox RCA lesion, 40% stenosed. Mid RCA-1 lesion, 80% stenosed. 4. Ost Ramus lesion, 70% stenosed. Ramus-1 lesion, 60% stenosed. 5. Prox Cx lesion, 70% stenosed. Mid Cx lesion, 80% stenosed. 6. The left ventricular systolic function is normal. 7. Normal Right Heart Pressures & LVEDP/PCWP 8. No obvious regional WMA   Carotid dopplers. 1. Carotid Duplex -  Mild plaque seen with no evidence of a  significant stenosis 1-39%.  ECHO: Study Conclusions  - Left ventricle: The cavity size was normal. There was mild focal basal hypertrophy of the septum. Systolic function was normal. The estimated ejection fraction was in the range of 55% to 60%. Wall motion was normal; there were no regional wall motion abnormalities. Features are consistent with a pseudonormal left ventricular filling pattern, with concomitant abnormal relaxation and increased filling pressure (grade 2 diastolic dysfunction). - Aortic valve: There was no stenosis. - Mitral valve: There was trivial regurgitation. - Right ventricle: The cavity size was normal. Systolic function was normal. - Pulmonary arteries: No complete TR doppler jet so unable to estimate PA systolic pressure. - Inferior vena cava: The vessel was normal in size. The respirophasic diameter changes were in the normal range (>= 50%), consistent with normal central venous pressure.  Impressions:  - Normal LV size and systolic function, EF 0000000. Moderate diastolic dysfunction. Normal RV size and systolic function. No significant valvular abnormalities.   ASSESSMENT AND PLAN:  1.  CAD with CABG 03/2015 with no complaints today, presented with respiratory distress and NSTEMI.  No problems since discahrge. Walking about a mile daily.  Eating well. Incisions withut drainage. Follow up with Dr. Johnsie Cancel in 2 months.  Keep appt with Dr. Nils Pyle pt does not wish to do cardiac rehab.   2.  Hx recent NSTEMI no further SOB.  3. ESRD on hemodialysis MWF in [place].   4. DM-2 IDDM stable per pt. Followed by PCP  5. Hyperlipidemia was not on statin prior to admit now he is treated and will recheck lipids in 6 weeks.  6. HTN was difficult to control - today elevated but with dialysis his BP drops.  Please note he is currently taking lopressor and coreg.  I am stopping his lopressor  and continue the coreg.  Pt aware.    7. Carotid disease on dopplers with CABG follow up in 1 year.   Other labs followed with renal.  Current medicines are reviewed with the patient today.  The patient  Has no concerns regarding medicines.  The following changes have been made:  See above Labs/ tests ordered today include:see above  Disposition:   FU:  see above  Signed, Isaiah Serge, NP  04/24/2015 1:36 PM    [facility] [address], [place], [place], [place] Phone: [phone]; Fax: [phone]

## 2015-05-01 ENCOUNTER — Ambulatory Visit: Payer: Medicare Other

## 2015-05-01 ENCOUNTER — Telehealth: Payer: Self-pay | Admitting: Cardiovascular Disease

## 2015-05-01 NOTE — Telephone Encounter (Signed)
New message   PA calling   Cabg x 4 on  10.20.2016  C/O pain, warmth, swelling right lower leg, induration,   Patient in the office now    Seen [name] on  11.10.2016

## 2015-05-01 NOTE — Telephone Encounter (Signed)
DISCUSSED WITH  DR Johnsie Cancel    PT NEEDS TO  F/U  WITH   SURGEON RE HARVEST  SITE   ISSUES   [name]   AWARE   .Adonis Housekeeper

## 2015-05-01 NOTE — Progress Notes (Unsigned)
This encounter was created in error - please disregard.

## 2015-05-12 ENCOUNTER — Other Ambulatory Visit: Payer: Self-pay | Admitting: Cardiothoracic Surgery

## 2015-05-12 ENCOUNTER — Ambulatory Visit: Payer: Medicare Other

## 2015-05-12 DIAGNOSIS — Z951 Presence of aortocoronary bypass graft: Secondary | ICD-10-CM

## 2015-05-13 ENCOUNTER — Ambulatory Visit: Payer: Self-pay | Admitting: Cardiothoracic Surgery

## 2015-05-14 ENCOUNTER — Ambulatory Visit: Payer: Medicare Other | Admitting: Cardiothoracic Surgery

## 2015-05-15 ENCOUNTER — Ambulatory Visit
Admission: RE | Admit: 2015-05-15 | Discharge: 2015-05-15 | Disposition: A | Discharge status: Home / Self Care | Payer: Medicare Other | Source: Ambulatory Visit | Attending: Cardiothoracic Surgery | Admitting: Cardiothoracic Surgery

## 2015-05-15 ENCOUNTER — Encounter: Payer: Self-pay | Admitting: Cardiothoracic Surgery

## 2015-05-15 ENCOUNTER — Ambulatory Visit (INDEPENDENT_AMBULATORY_CARE_PROVIDER_SITE_OTHER): Payer: Self-pay | Admitting: Physician Assistant

## 2015-05-15 VITALS — BP 176/82 | HR 88 | Resp 16 | Ht 77.0 in | Wt 214.0 lb

## 2015-05-15 DIAGNOSIS — Z951 Presence of aortocoronary bypass graft: Secondary | ICD-10-CM

## 2015-05-15 DIAGNOSIS — I2510 Atherosclerotic heart disease of native coronary artery without angina pectoris: Secondary | ICD-10-CM

## 2015-05-15 MED ORDER — LOSARTAN POTASSIUM 100 MG PO TABS: 100.0000 mg | ORAL_TABLET | Freq: Every day | ORAL | Status: DC

## 2015-05-15 NOTE — Progress Notes (Signed)
  HPI: Patient returns for routine postoperative follow-up having undergone CABG x 4 on 04/03/2015. The patient's early postoperative recovery while in the hospital was notable for dialysis due to H/O ESRD.  He maintained NSR.  He had issues with hypertension requiring multiple agents.  Since hospital discharge the patient reports he is doing well for the most part.  He states his blood pressure has been running high lately.  There was some documentation from cardiology that patient was hypotensive after dialysis, but per patient he has not had issues with this recently.  He denies chest pain, shortness of breath.  He states he is ambulating without difficulty and for the most part has resumed most normal activity.  He does ask if he can go "rabbit" hunting this weekend.     Current Outpatient Prescriptions  Medication Sig Dispense Refill  . amLODipine (NORVASC) 10 MG tablet Take 10 mg by mouth daily.     Marland Kitchen aspirin EC 325 MG EC tablet Take 1 tablet (325 mg total) by mouth daily. 30 tablet 0  . atorvastatin (LIPITOR) 80 MG tablet Take 1 tablet (80 mg total) by mouth daily at 6 PM. 30 tablet 0  . calcitRIOL (ROCALTROL) 0.5 MCG capsule Take 2 capsules (1 mcg total) by mouth every Monday, Wednesday, and Friday at 6 PM. 30 capsule 1  . carvedilol (COREG) 25 MG tablet Take 1 tablet by mouth 2 (two) times daily.    . Cholecalciferol 1000 UNITS tablet Take 1,000 Units by mouth daily.    . cinacalcet (SENSIPAR) 30 MG tablet Take 30 mg by mouth 2 (two) times daily with a meal.    . cloNIDine (CATAPRES) 0.1 MG tablet Take 0.1 mg by mouth 3 (three) times daily.     . insulin glargine (LANTUS) 100 UNIT/ML injection Inject 40 Units into the skin at bedtime.     Marland Kitchen losartan (COZAAR) 100 MG tablet Take 1 tablet (100 mg total) by mouth daily. 30 tablet 1  . Multiple Vitamin (DAILY VITAMIN PO) Take 1 tablet by mouth daily.    Marland Kitchen NITROSTAT 0.4 MG SL tablet Place 0.4 mg under the tongue every 5 (five) minutes as needed  for chest pain.      No current facility-administered medications for this visit.    Physical Exam:  BP 176/82 mmHg  Pulse 88  Resp 16  Ht 6\' 5"  (1.956 m)  Wt 214 lb (97.07 kg)  BMI 25.37 kg/m2  SpO2 94%  Gen: no apparent distress Heart: RRR Lungs: CTA bilaterally Abd: soft non-tender, non-distended Incisions: well healed, no acute signs of infection  Diagnostic Tests:  CXR: no significant effusions, some residual atelectasis  A/P:  1. S/P CABG, ESRD- doing well 2. HTN- currently on Coreg, Clonidine, Norvasc, and Cozaar, BP today is 170s- will increase Cozaar to 100 mg daily... Patient instructed to follow up with PCP in next 2 weeks for further management of BP 3. Activity- increase activity as tolerated, patient instructed he may resume driving.... In regards to hunting he was advised against this.  He was instructed that ideally he should be 12 weeks out to ensure bone has fully healed, prior to handling a gun.... He stated he understood this 4. Dispo- RTC in 6-8 weeks  BARRETT, ERIN, PA-C [facility] [phone]

## 2015-05-17 ENCOUNTER — Other Ambulatory Visit: Payer: Self-pay | Admitting: Physician Assistant

## 2015-05-19 ENCOUNTER — Ambulatory Visit: Payer: Self-pay

## 2015-06-03 ENCOUNTER — Other Ambulatory Visit (INDEPENDENT_AMBULATORY_CARE_PROVIDER_SITE_OTHER): Payer: Medicare Other

## 2015-06-03 DIAGNOSIS — E785 Hyperlipidemia, unspecified: Secondary | ICD-10-CM

## 2015-06-03 NOTE — Addendum Note (Signed)
Addended by: Eulis Foster on: 06/03/2015 07:43 AM   Modules accepted: Orders

## 2015-07-07 NOTE — Progress Notes (Signed)
No show

## 2015-07-08 ENCOUNTER — Encounter: Payer: Self-pay | Admitting: Cardiovascular Disease

## 2015-07-09 ENCOUNTER — Encounter: Payer: Self-pay | Admitting: Cardiothoracic Surgery

## 2015-07-09 ENCOUNTER — Ambulatory Visit (INDEPENDENT_AMBULATORY_CARE_PROVIDER_SITE_OTHER): Payer: Medicare Other | Admitting: Cardiothoracic Surgery

## 2015-07-09 ENCOUNTER — Encounter: Payer: Medicare Other | Admitting: Cardiothoracic Surgery

## 2015-07-09 VITALS — BP 156/79 | HR 88 | Resp 16 | Ht 77.0 in | Wt 199.0 lb

## 2015-07-09 DIAGNOSIS — I2510 Atherosclerotic heart disease of native coronary artery without angina pectoris: Secondary | ICD-10-CM

## 2015-07-09 DIAGNOSIS — Z951 Presence of aortocoronary bypass graft: Secondary | ICD-10-CM | POA: Diagnosis not present

## 2015-07-09 NOTE — Progress Notes (Signed)
PCP is CLAGGETT,ELIN, PA-C Referring Provider is Josue Hector, MD  Chief Complaint  Patient presents with  . Routine Post Op    8 wk f/u s/p CABG X 4 04/03/15    HPI: Patient returns for scheduled 3 months postop followup CABG x4 04/03/2015 for unstable angina following MI History of chronic renal failure on dialysis, hypertension, diabetes  Nonsmoker  Doing well without recurrent angina or symptoms of CHF. Last chest x-ray clear All incisions clean and dry Takes multiple blood pressure medications which are needed to control his hypertension  Past Medical History  Diagnosis Date  . Hypertension   . Diabetes mellitus without complication ([place])   . Kidney infection   . MI (mitral incompetence)   . CAD (coronary artery disease)     STEMI with LAD stent by C Granger in 2005  . Seasonal allergies   . Depression   . History of kidney stones   . Arthritis   . Myocardial infarction [facility])     Past Surgical History  Procedure Laterality Date  . Back surgery    . Coronary angioplasty with stent placement  2008  . Spine surgery    . Eye surgery Right     lens implant  . Colonoscopy w/ polypectomy    . Insertion of dialysis catheter Right 01/30/2014    Procedure: INSERTION OF DIALYSIS CATHETER-RIGHT INTERNAL JUGULAR;  Surgeon: Elam Dutch, MD;  Location: [place];  Service: Vascular;  Laterality: Right;  . Av fistula placement Left 01/30/2014    Procedure: INSERTION OF ARTERIOVENOUS (AV) GORE-TEX GRAFT LEFT UPPER ARM;  Surgeon: Elam Dutch, MD;  Location: [place];  Service: Vascular;  Laterality: Left;  . Cardiac catheterization N/A 04/01/2015    Procedure: Right/Left Heart Cath and Coronary Angiography;  Surgeon: Leonie Man, MD;  Location: [place] [facility];  Service: Cardiovascular;  Laterality: N/A;  . Coronary artery bypass graft N/A 04/03/2015    Procedure: CORONARY ARTERY BYPASS GRAFTING (CABG) x 4 (LIMA to LAD, SVG to DIAGONAL, SVG to OM1, and SVG to RCA) with  EVH from right greater saphenous thigh and partial lower leg vein ;  Surgeon: Ivin Poot, MD;  Location: [place];  Service: Open Heart Surgery;  Laterality: N/A;  . Tee without cardioversion N/A 04/03/2015    Procedure: TRANSESOPHAGEAL ECHOCARDIOGRAM (TEE);  Surgeon: Ivin Poot, MD;  Location: [place];  Service: Open Heart Surgery;  Laterality: N/A;    Family History  Problem Relation Age of Onset  . Diabetes Other   . Diabetes Mother   . Hypertension Mother   . Diabetes Father   . Hypertension Father     Social History Social History  Substance Use Topics  . Smoking status: Never Smoker   . Smokeless tobacco: Former Systems developer    Types: [place] date: 06/14/2010  . Alcohol Use: No    Current Outpatient Prescriptions  Medication Sig Dispense Refill  . amLODipine (NORVASC) 10 MG tablet Take 10 mg by mouth daily.     Marland Kitchen aspirin 81 MG tablet Take 81 mg by mouth daily. PT CHANGED TO 81 MG HIMSELF    . calcitRIOL (ROCALTROL) 0.5 MCG capsule Take 2 capsules (1 mcg total) by mouth every Monday, Wednesday, and Friday at 6 PM. 30 capsule 1  . carvedilol (COREG) 25 MG tablet Take 1 tablet by mouth 2 (two) times daily.    . Cholecalciferol 1000 UNITS tablet Take 1,000 Units by mouth daily.    Marland Kitchen  cinacalcet (SENSIPAR) 30 MG tablet Take 30 mg by mouth 2 (two) times daily with a meal.    . cloNIDine (CATAPRES) 0.1 MG tablet Take 0.1 mg by mouth 3 (three) times daily.     . insulin glargine (LANTUS) 100 UNIT/ML injection Inject 40 Units into the skin at bedtime.     Marland Kitchen losartan (COZAAR) 100 MG tablet Take 1 tablet (100 mg total) by mouth daily. 30 tablet 1  . Multiple Vitamin (DAILY VITAMIN PO) Take 1 tablet by mouth daily.    Marland Kitchen NITROSTAT 0.4 MG SL tablet Place 0.4 mg under the tongue every 5 (five) minutes as needed for chest pain.     Marland Kitchen atorvastatin (LIPITOR) 80 MG tablet Take 1 tablet (80 mg total) by mouth daily at 6 PM. (Patient not taking: Reported on 07/09/2015) 30 tablet 0   No  current facility-administered medications for this visit.    Allergies  Allergen Reactions  . Shellfish Allergy Hives    Review of Systems  Good energy and appetite Able to return to rabbit hunting  BP 156/79 mmHg  Pulse 88  Resp 16  Ht 6\' 5"  (1.956 m)  Wt 199 lb (90.266 kg)  BMI 23.59 kg/m2  SpO2 98% Physical Exam    General- alert and comfortable   Lungs- clear without rales, wheezes   Cor- regular rate and rhythm, no murmur , gallop   Abdomen- soft, non-tender   Extremities - warm, non-tender, minimal edema   Neuro- oriented, appropriate, no focal weakness  Diagnostic Tests: No chest x-ray today Chest x-ray last month with clear lung fields no pleural effusion  Impression: Good recovery after multivessel CABG for postinfarction unstable angina , three-vessel disease  Plan:continue current medications Follow heart healthy diet and lifestyle Return as needed   Len Childs, MD [facility] [phone]

## 2015-07-10 ENCOUNTER — Encounter: Payer: Medicare Other | Admitting: Cardiovascular Disease

## 2015-07-15 ENCOUNTER — Encounter: Payer: Self-pay | Admitting: Cardiovascular Disease

## 2015-07-15 ENCOUNTER — Telehealth: Payer: Self-pay | Admitting: Cardiovascular Disease

## 2015-07-15 NOTE — Telephone Encounter (Signed)
Error sending letter

## 2015-12-05 DIAGNOSIS — I2510 Atherosclerotic heart disease of native coronary artery without angina pectoris: Secondary | ICD-10-CM | POA: Insufficient documentation

## 2015-12-05 DIAGNOSIS — Z951 Presence of aortocoronary bypass graft: Secondary | ICD-10-CM | POA: Insufficient documentation

## 2016-04-19 IMAGING — CR DG CHEST 2V
2 series · 2 of 2 positions shown · non-contrast
Comparison: Portable chest x-ray April 06, 2015.

CLINICAL DATA: Shortness of breath, status post CABG on April 03, 2015

EXAM:
CHEST  2 VIEW

[chest pa]
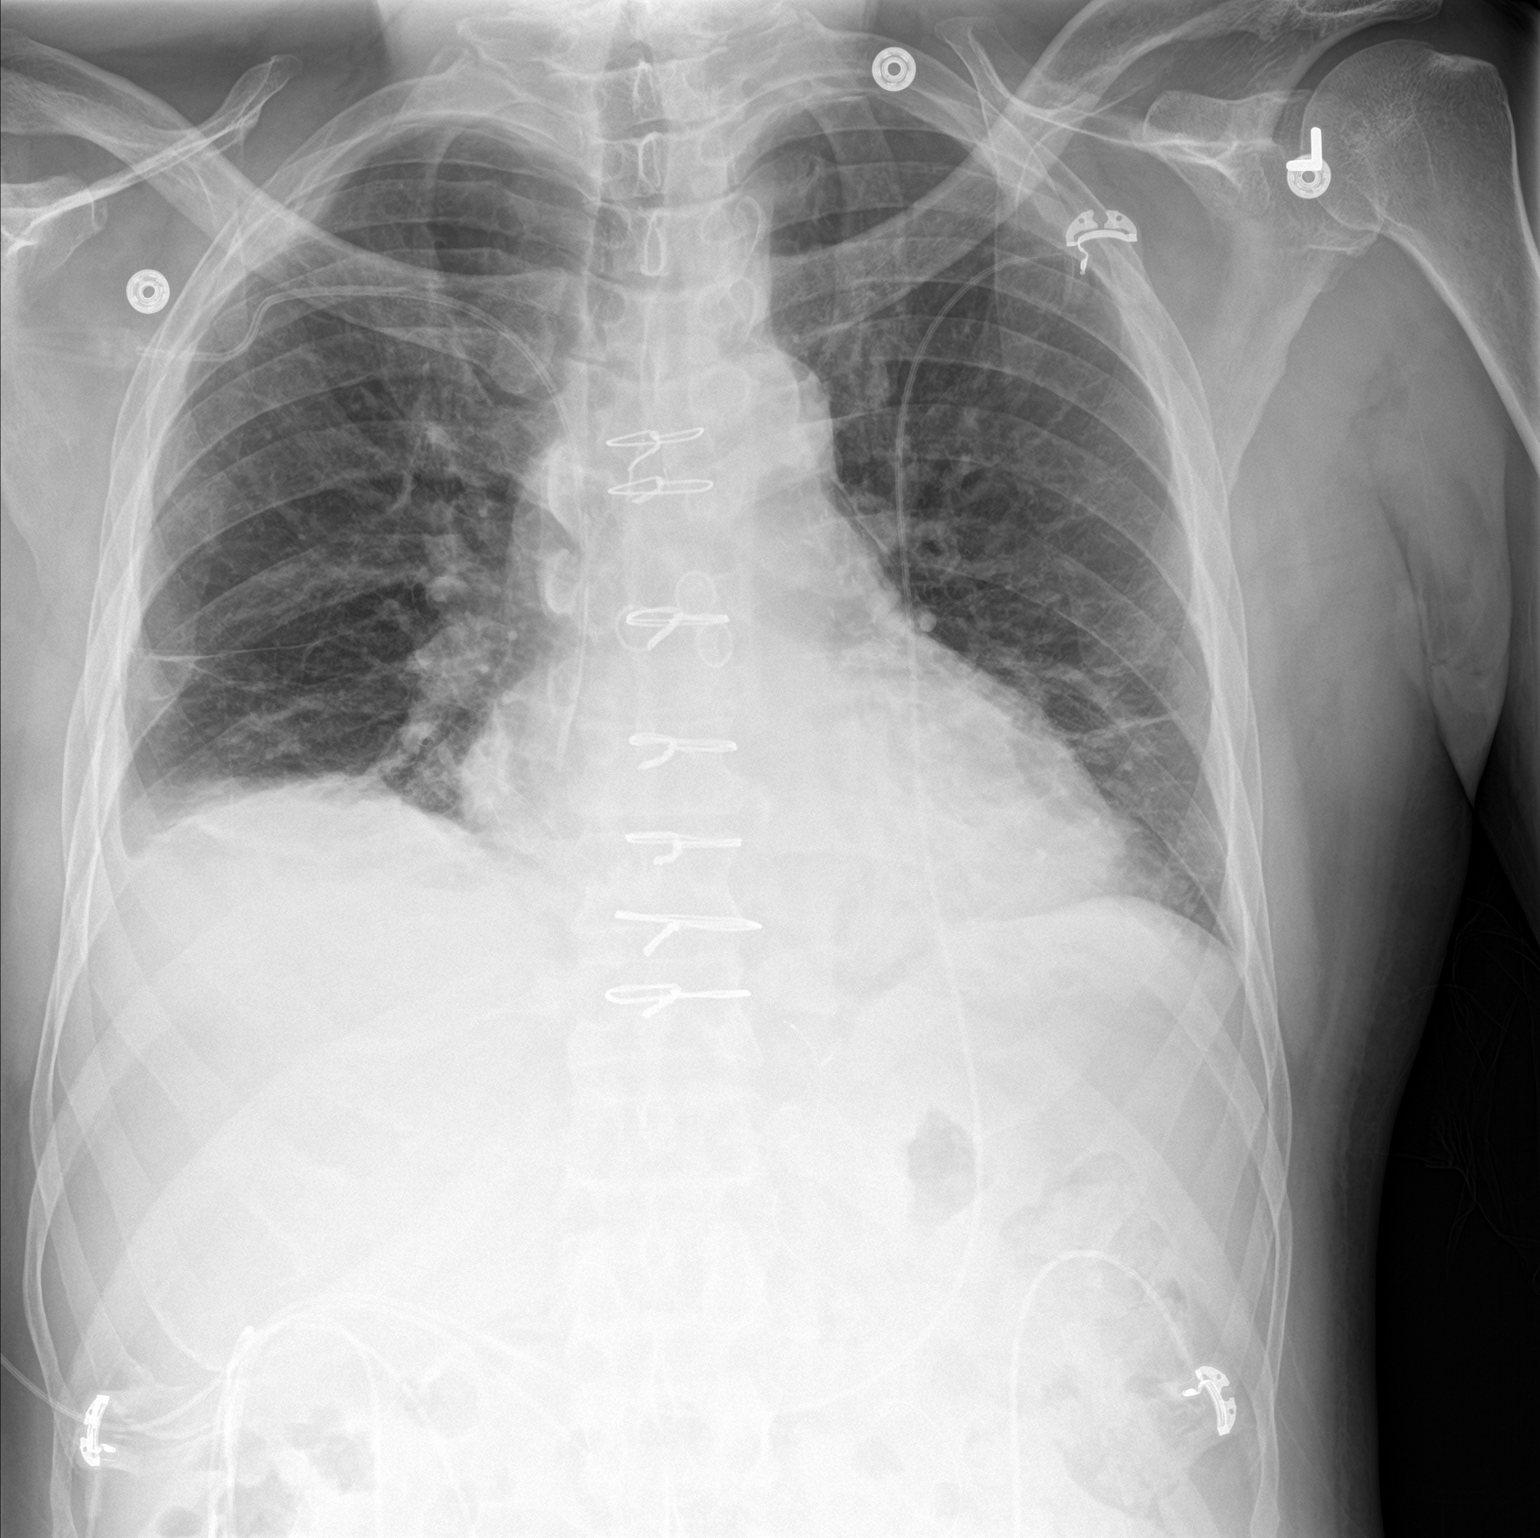

[chest lat]
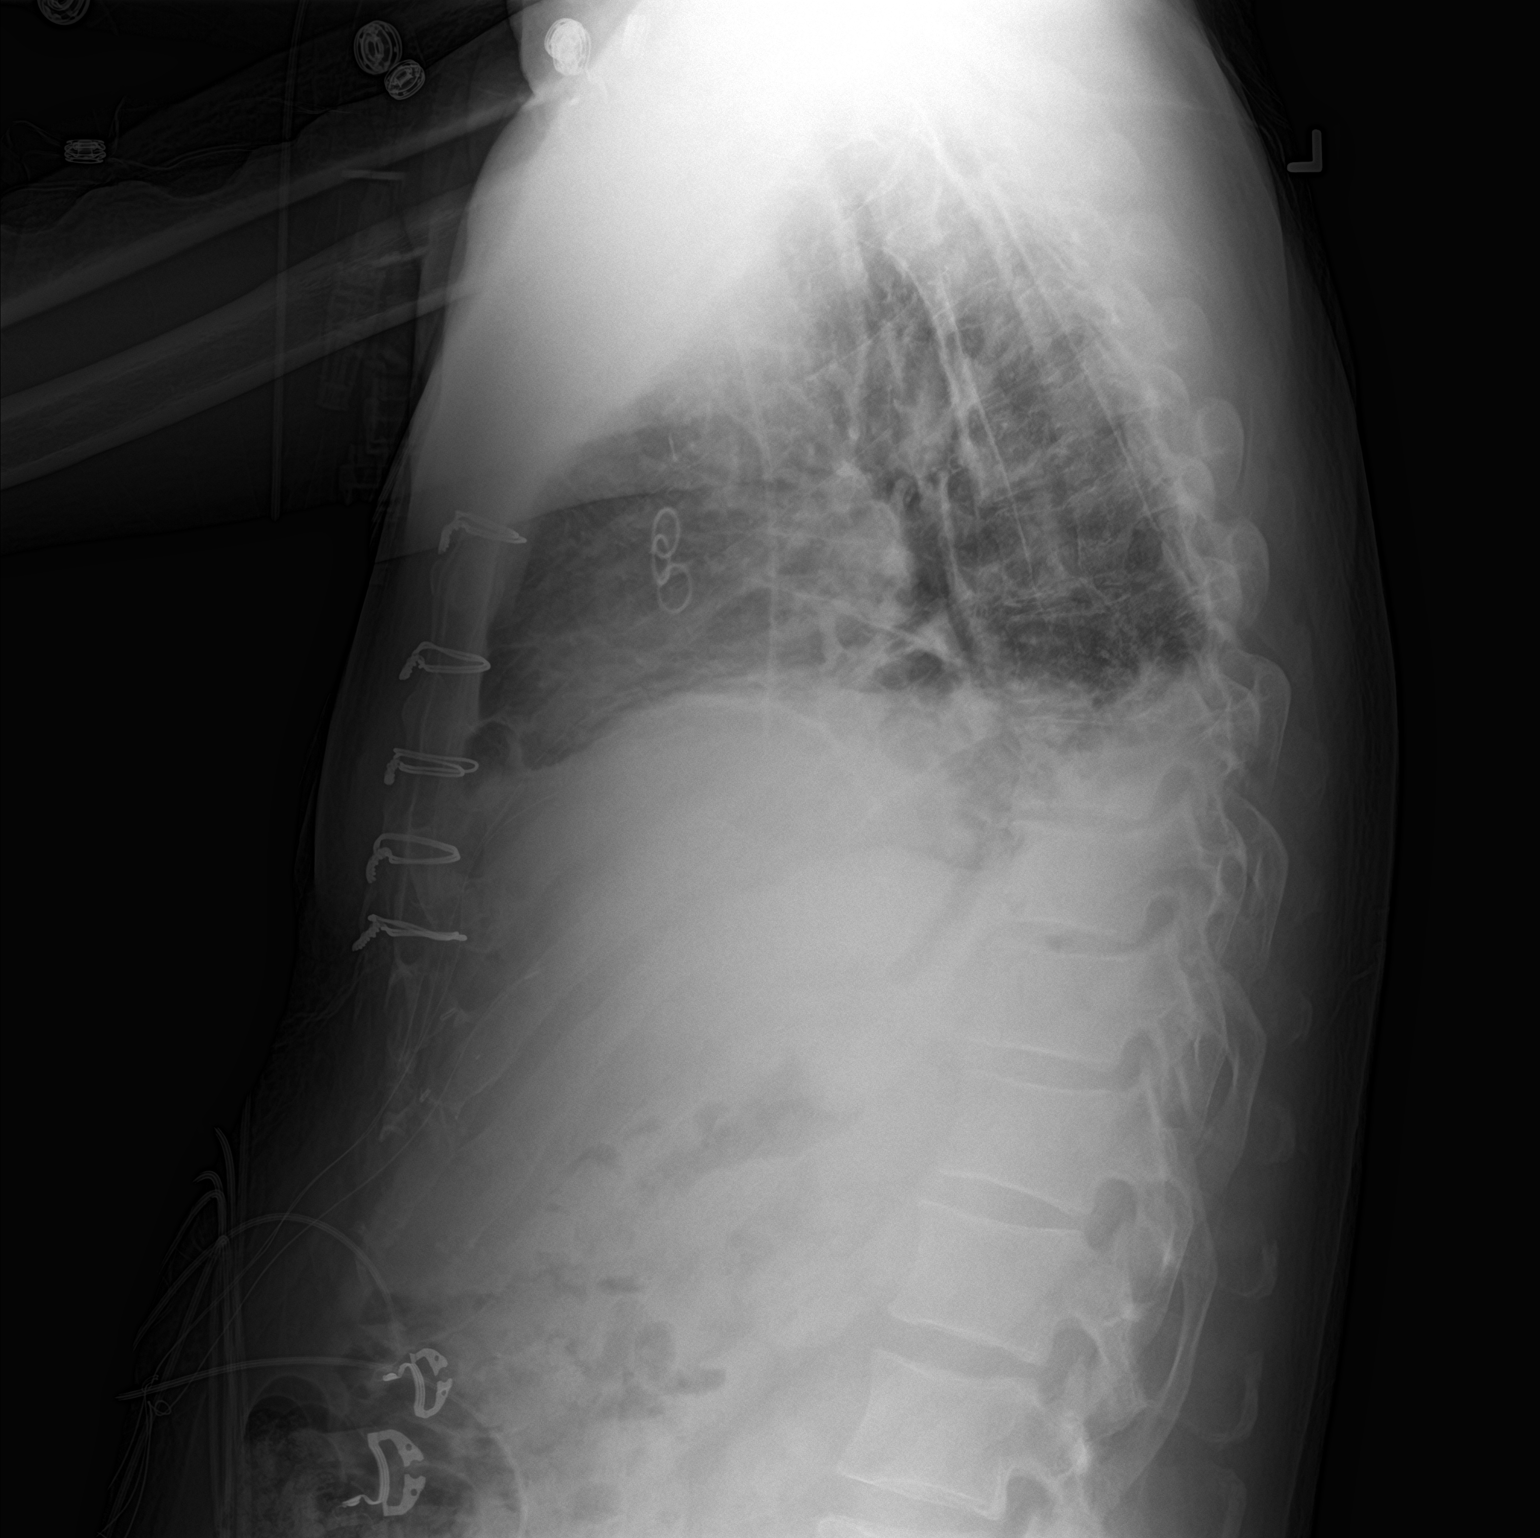

[2 of 2 positions shown; findings below may reference images not displayed]

FINDINGS: There remains mild volume loss on the right consistent with a
pleural effusion. The left lung is better inflated but a tiny left
apical pneumothorax persists. A small amount of pneumomediastinum
persists just inferior to the aortic knob. The pulmonary
interstitial markings remain increased consistent with pulmonary
edema. The cardiac silhouette remains enlarged. There are 7 intact
sternal wires.

The right internal jugular Cordis sheath has been removed. The right
subclavian venous catheter tip is stable with the tip at the
junction of the SVC with the right atrium.
IMPRESSION: Bibasilar atelectasis with small right greater than left pleural
effusions. Persistent mild pulmonary interstitial edema. Stable tiny
left apical pneumothorax.

## 2016-06-14 ENCOUNTER — Emergency Department (HOSPITAL_COMMUNITY): Payer: Medicare Other

## 2016-06-14 ENCOUNTER — Emergency Department (HOSPITAL_COMMUNITY)
Admission: EM | Admit: 2016-06-14 | Discharge: 2016-06-14 | Disposition: A | Discharge status: Home / Self Care | Payer: Medicare Other | Attending: Emergency Medicine | Admitting: Emergency Medicine

## 2016-06-14 ENCOUNTER — Encounter (HOSPITAL_COMMUNITY): Payer: Self-pay | Admitting: Emergency Medicine

## 2016-06-14 DIAGNOSIS — Y929 Unspecified place or not applicable: Secondary | ICD-10-CM | POA: Diagnosis not present

## 2016-06-14 DIAGNOSIS — Z7982 Long term (current) use of aspirin: Secondary | ICD-10-CM | POA: Diagnosis not present

## 2016-06-14 DIAGNOSIS — S0292XA Unspecified fracture of facial bones, initial encounter for closed fracture: Secondary | ICD-10-CM

## 2016-06-14 DIAGNOSIS — I5031 Acute diastolic (congestive) heart failure: Secondary | ICD-10-CM | POA: Insufficient documentation

## 2016-06-14 DIAGNOSIS — Y939 Activity, unspecified: Secondary | ICD-10-CM | POA: Insufficient documentation

## 2016-06-14 DIAGNOSIS — I252 Old myocardial infarction: Secondary | ICD-10-CM | POA: Diagnosis not present

## 2016-06-14 DIAGNOSIS — I2510 Atherosclerotic heart disease of native coronary artery without angina pectoris: Secondary | ICD-10-CM | POA: Insufficient documentation

## 2016-06-14 DIAGNOSIS — S0232XA Fracture of orbital floor, left side, initial encounter for closed fracture: Secondary | ICD-10-CM | POA: Insufficient documentation

## 2016-06-14 DIAGNOSIS — I132 Hypertensive heart and chronic kidney disease with heart failure and with stage 5 chronic kidney disease, or end stage renal disease: Secondary | ICD-10-CM | POA: Insufficient documentation

## 2016-06-14 DIAGNOSIS — Z79899 Other long term (current) drug therapy: Secondary | ICD-10-CM | POA: Insufficient documentation

## 2016-06-14 DIAGNOSIS — S0990XA Unspecified injury of head, initial encounter: Secondary | ICD-10-CM | POA: Diagnosis present

## 2016-06-14 DIAGNOSIS — W19XXXA Unspecified fall, initial encounter: Secondary | ICD-10-CM

## 2016-06-14 DIAGNOSIS — Z992 Dependence on renal dialysis: Secondary | ICD-10-CM | POA: Insufficient documentation

## 2016-06-14 DIAGNOSIS — Z87891 Personal history of nicotine dependence: Secondary | ICD-10-CM | POA: Diagnosis not present

## 2016-06-14 DIAGNOSIS — Z794 Long term (current) use of insulin: Secondary | ICD-10-CM | POA: Insufficient documentation

## 2016-06-14 DIAGNOSIS — N186 End stage renal disease: Secondary | ICD-10-CM | POA: Insufficient documentation

## 2016-06-14 DIAGNOSIS — E1122 Type 2 diabetes mellitus with diabetic chronic kidney disease: Secondary | ICD-10-CM | POA: Insufficient documentation

## 2016-06-14 DIAGNOSIS — W0110XA Fall on same level from slipping, tripping and stumbling with subsequent striking against unspecified object, initial encounter: Secondary | ICD-10-CM | POA: Diagnosis not present

## 2016-06-14 DIAGNOSIS — Z955 Presence of coronary angioplasty implant and graft: Secondary | ICD-10-CM | POA: Diagnosis not present

## 2016-06-14 DIAGNOSIS — R0789 Other chest pain: Secondary | ICD-10-CM

## 2016-06-14 DIAGNOSIS — Y999 Unspecified external cause status: Secondary | ICD-10-CM | POA: Insufficient documentation

## 2016-06-14 DIAGNOSIS — S02401A Maxillary fracture, unspecified, initial encounter for closed fracture: Secondary | ICD-10-CM | POA: Diagnosis not present

## 2016-06-14 LAB — CBC WITH DIFFERENTIAL/PLATELET
Basophils Absolute: 0 10*3/uL (ref 0.0–0.1)
Basophils Relative: 0 %
Eosinophils Absolute: 0.2 10*3/uL (ref 0.0–0.7)
Eosinophils Relative: 3 %
HCT: 31.1 % — ABNORMAL LOW (ref 39.0–52.0)
Hemoglobin: 10.7 g/dL — ABNORMAL LOW (ref 13.0–17.0)
Lymphocytes Relative: 18 %
Lymphs Abs: 1.3 10*3/uL (ref 0.7–4.0)
MCH: 35.2 pg — ABNORMAL HIGH (ref 26.0–34.0)
MCHC: 34.4 g/dL (ref 30.0–36.0)
MCV: 102.3 fL — ABNORMAL HIGH (ref 78.0–100.0)
Monocytes Absolute: 0.4 10*3/uL (ref 0.1–1.0)
Monocytes Relative: 6 %
Neutro Abs: 5.2 10*3/uL (ref 1.7–7.7)
Neutrophils Relative %: 73 %
Platelets: 158 10*3/uL (ref 150–400)
RBC: 3.04 MIL/uL — ABNORMAL LOW (ref 4.22–5.81)
RDW: 13.2 % (ref 11.5–15.5)
WBC: 7.2 10*3/uL (ref 4.0–10.5)

## 2016-06-14 LAB — BASIC METABOLIC PANEL
Anion gap: 10 (ref 5–15)
BUN: 48 mg/dL — ABNORMAL HIGH (ref 6–20)
CO2: 30 mmol/L (ref 22–32)
Calcium: 9.2 mg/dL (ref 8.9–10.3)
Chloride: 95 mmol/L — ABNORMAL LOW (ref 101–111)
Creatinine, Ser: 8.47 mg/dL — ABNORMAL HIGH (ref 0.61–1.24)
GFR calc Af Amer: 7 mL/min — ABNORMAL LOW (ref >60)
GFR calc non Af Amer: 6 mL/min — ABNORMAL LOW (ref >60)
Glucose, Bld: 208 mg/dL — ABNORMAL HIGH (ref 65–99)
Potassium: 4.8 mmol/L (ref 3.5–5.1)
Sodium: 135 mmol/L (ref 135–145)

## 2016-06-14 LAB — I-STAT CHEM 8, ED
BUN: 56 mg/dL — ABNORMAL HIGH (ref 6–20)
Calcium, Ion: 1.05 mmol/L — ABNORMAL LOW (ref 1.15–1.40)
Chloride: 98 mmol/L — ABNORMAL LOW (ref 101–111)
Creatinine, Ser: 9.3 mg/dL — ABNORMAL HIGH (ref 0.61–1.24)
Glucose, Bld: 199 mg/dL — ABNORMAL HIGH (ref 65–99)
HCT: 30 % — ABNORMAL LOW (ref 39.0–52.0)
Hemoglobin: 10.2 g/dL — ABNORMAL LOW (ref 13.0–17.0)
Potassium: 5.8 mmol/L — ABNORMAL HIGH (ref 3.5–5.1)
Sodium: 135 mmol/L (ref 135–145)
TCO2: 30 mmol/L (ref 0–100)

## 2016-06-14 LAB — PROTIME-INR
INR: 0.98
Prothrombin Time: 13 seconds (ref 11.4–15.2)

## 2016-06-14 MED ORDER — CLONIDINE HCL 0.1 MG PO TABS
0.1000 mg | ORAL_TABLET | Freq: Once | ORAL | Status: AC
  Administered 2016-06-14: 0.1 mg via ORAL
  Filled 2016-06-14: qty 1

## 2016-06-14 MED ORDER — ACETAMINOPHEN 325 MG PO TABS
650.0000 mg | ORAL_TABLET | Freq: Once | ORAL | Status: AC
  Administered 2016-06-14: 650 mg via ORAL

## 2016-06-14 MED ORDER — ACETAMINOPHEN 325 MG PO TABS
ORAL_TABLET | ORAL | Status: AC
  Administered 2016-06-14: 650 mg via ORAL
  Filled 2016-06-14: qty 2

## 2016-06-14 NOTE — Discharge Instructions (Signed)
Keep your dialysis schedule. CT shows evidence of left facial fractures around the orbit into the maxillary sinus. This would explain bleeding from the nose. Make an appointment to follow-up with the ear nose and throat Dr. Wilburn Cornelia. Office number and location provided. Chest x-ray showed no evidence of any rib fractures or any injuries to the lung.
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
## 2016-06-14 NOTE — ED Notes (Signed)
Patient with no complaints at this time. Respirations even and unlabored. Skin warm/dry. Discharge instructions reviewed with patient at this time. Patient given opportunity to voice concerns/ask questions. IV removed per policy and band-aid applied to site. Patient discharged at this time and left Emergency Department with steady gait.  
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
## 2016-06-20 ENCOUNTER — Encounter (HOSPITAL_COMMUNITY): Payer: Self-pay | Admitting: Emergency Medicine

## 2016-06-20 ENCOUNTER — Emergency Department (HOSPITAL_COMMUNITY)
Admission: EM | Admit: 2016-06-20 | Discharge: 2016-06-20 | Disposition: A | Discharge status: Home / Self Care | Payer: Medicare Other | Attending: Emergency Medicine | Admitting: Emergency Medicine

## 2016-06-20 DIAGNOSIS — Z7982 Long term (current) use of aspirin: Secondary | ICD-10-CM | POA: Insufficient documentation

## 2016-06-20 DIAGNOSIS — R040 Epistaxis: Secondary | ICD-10-CM | POA: Diagnosis not present

## 2016-06-20 DIAGNOSIS — Z87891 Personal history of nicotine dependence: Secondary | ICD-10-CM | POA: Diagnosis not present

## 2016-06-20 DIAGNOSIS — N186 End stage renal disease: Secondary | ICD-10-CM | POA: Diagnosis not present

## 2016-06-20 DIAGNOSIS — I132 Hypertensive heart and chronic kidney disease with heart failure and with stage 5 chronic kidney disease, or end stage renal disease: Secondary | ICD-10-CM | POA: Diagnosis not present

## 2016-06-20 DIAGNOSIS — I5031 Acute diastolic (congestive) heart failure: Secondary | ICD-10-CM | POA: Diagnosis not present

## 2016-06-20 DIAGNOSIS — Z794 Long term (current) use of insulin: Secondary | ICD-10-CM | POA: Insufficient documentation

## 2016-06-20 DIAGNOSIS — Z79899 Other long term (current) drug therapy: Secondary | ICD-10-CM | POA: Diagnosis not present

## 2016-06-20 DIAGNOSIS — I2510 Atherosclerotic heart disease of native coronary artery without angina pectoris: Secondary | ICD-10-CM | POA: Insufficient documentation

## 2016-06-20 DIAGNOSIS — Z951 Presence of aortocoronary bypass graft: Secondary | ICD-10-CM | POA: Diagnosis not present

## 2016-06-20 DIAGNOSIS — Z992 Dependence on renal dialysis: Secondary | ICD-10-CM | POA: Diagnosis not present

## 2016-06-20 DIAGNOSIS — E1122 Type 2 diabetes mellitus with diabetic chronic kidney disease: Secondary | ICD-10-CM | POA: Diagnosis not present

## 2016-06-20 MED ORDER — HYDRALAZINE HCL 20 MG/ML IJ SOLN: 5.0000 mg | Freq: Once | INTRAMUSCULAR | Status: DC

## 2016-06-20 MED ORDER — OXYMETAZOLINE HCL 0.05 % NA SOLN
1.0000 | Freq: Once | NASAL | Status: DC
  Filled 2016-06-20: qty 15

## 2016-06-20 NOTE — ED Notes (Signed)
No bleeding noted, Dr Roderic Palau at bedside, advised to "hold off" on blood work and medication,

## 2016-06-20 NOTE — Discharge Instructions (Signed)
Stop taking your aspirin for a week. Follow-up with your family doctor later this week and follow-up with thelist you were referred to. If it starts bleeding again go ahead and spray the Afrin in both sides of your nose and hold it tight for 15 minutes. If you do not stop bleeding just come back to the hospital

## 2016-06-20 NOTE — ED Notes (Signed)
ED Provider at bedside. 

## 2016-06-20 NOTE — ED Triage Notes (Signed)
Nose bleed from left nostril x 30 min

## 2016-06-26 NOTE — ED Provider Notes (Signed)
[facility] DEPT Provider Note   CSN: [id number] Arrival date & time: 06/20/16  1838     History   Chief Complaint Chief Complaint  Patient presents with  . Epistaxis    HPI [name] is a 66 y.o. male.  Patient presents with bleeding from left nostril.   The history is provided by the patient.  Epistaxis   This is a recurrent problem. The current episode started 1 to 2 hours ago. The problem occurs rarely. The problem has been resolved. The problem is associated with aspirin. The bleeding has been from the left nare. He has tried nothing for the symptoms. His past medical history does not include allergies.    Past Medical History:  Diagnosis Date  . Arthritis   . CAD (coronary artery disease)    STEMI with LAD stent by C Granger in 2005  . Depression   . Diabetes mellitus without complication ([place])   . History of kidney stones   . Hypertension   . Kidney infection   . MI (mitral incompetence)   . Myocardial infarction   . Seasonal allergies     Patient Active Problem List   Diagnosis Date Noted  . CAD (coronary artery disease)   . Secondary hypertension, unspecified   . Acute diastolic heart failure ([place])   . SOB (shortness of breath) 03/31/2015  . Volume overload 03/31/2015  . NSTEMI (non-ST elevated myocardial infarction) ([place]) 03/31/2015  . ESRD on dialysis ([place])   . Hypoxia 03/21/2015  . Fluid overload 03/21/2015  . End stage renal disease ([place]) 03/07/2014  . Chronic kidney disease 01/29/2014  . Acute on chronic renal failure ([place]) 01/14/2014  . Anemia of chronic disease 01/14/2014  . Hyponatremia 01/14/2014  . Hyperkalemia 01/14/2014  . Type 2 diabetes mellitus with diabetic nephropathy ([place]) 09/02/2009  . HYPERLIPIDEMIA 09/02/2009  . Essential hypertension 09/02/2009  . Coronary atherosclerosis 09/02/2009    Past Surgical History:  Procedure Laterality Date  . AV FISTULA PLACEMENT Left 01/30/2014   Procedure: INSERTION OF ARTERIOVENOUS  (AV) GORE-TEX GRAFT LEFT UPPER ARM;  Surgeon: Elam Dutch, MD;  Location: [facility];  Service: Vascular;  Laterality: Left;  . BACK SURGERY    . CARDIAC CATHETERIZATION N/A 04/01/2015   Procedure: Right/Left Heart Cath and Coronary Angiography;  Surgeon: Leonie Man, MD;  Location: [facility];  Service: Cardiovascular;  Laterality: N/A;  . COLONOSCOPY W/ POLYPECTOMY    . CORONARY ANGIOPLASTY WITH STENT PLACEMENT  2008  . CORONARY ARTERY BYPASS GRAFT N/A 04/03/2015   Procedure: CORONARY ARTERY BYPASS GRAFTING (CABG) x 4 (LIMA to LAD, SVG to DIAGONAL, SVG to OM1, and SVG to RCA) with EVH from right greater saphenous thigh and partial lower leg vein ;  Surgeon: Ivin Poot, MD;  Location: [facility];  Service: Open Heart Surgery;  Laterality: N/A;  . EYE SURGERY Right    lens implant  . INSERTION OF DIALYSIS CATHETER Right 01/30/2014   Procedure: INSERTION OF DIALYSIS CATHETER-RIGHT INTERNAL JUGULAR;  Surgeon: Elam Dutch, MD;  Location: [facility];  Service: Vascular;  Laterality: Right;  . SPINE SURGERY    . TEE WITHOUT CARDIOVERSION N/A 04/03/2015   Procedure: TRANSESOPHAGEAL ECHOCARDIOGRAM (TEE);  Surgeon: Ivin Poot, MD;  Location: [facility];  Service: Open Heart Surgery;  Laterality: N/A;       Home Medications    Prior to Admission medications   Medication Sig Start Date End Date Taking? Authorizing Provider  acetaminophen (TYLENOL) 500 MG tablet  Take 500 mg by mouth every 6 (six) hours as needed for mild pain or moderate pain.   Yes Historical Provider, MD  amLODipine (NORVASC) 10 MG tablet Take 10 mg by mouth daily.  01/13/14  Yes Historical Provider, MD  aspirin 81 MG tablet Take 81 mg by mouth daily. PT CHANGED TO 81 MG HIMSELF   Yes Historical Provider, MD  atorvastatin (LIPITOR) 80 MG tablet Take 1 tablet (80 mg total) by mouth daily at 6 PM. 04/10/15  Yes Donielle Liston Alba, PA-C  calcitRIOL (ROCALTROL) 0.5 MCG capsule Take 2 capsules (1 mcg total) by mouth every  Monday, Wednesday, and Friday at 6 PM. 04/10/15  Yes Donielle Liston Alba, PA-C  carvedilol (COREG) 25 MG tablet Take 1 tablet by mouth 2 (two) times daily. 02/27/15  Yes Historical Provider, MD  Cholecalciferol 1000 UNITS tablet Take 1,000 Units by mouth daily.   Yes Historical Provider, MD  cinacalcet (SENSIPAR) 30 MG tablet Take 60 mg by mouth 2 (two) times daily with a meal.    Yes Historical Provider, MD  cloNIDine (CATAPRES) 0.1 MG tablet Take 0.1 mg by mouth 3 (three) times daily.  11/07/13  Yes Historical Provider, MD  insulin glargine (LANTUS) 100 UNIT/ML injection Inject 40 Units into the skin at bedtime.    Yes Historical Provider, MD  Multiple Vitamin (DAILY VITAMIN PO) Take 1 tablet by mouth daily.   Yes Historical Provider, MD  NITROSTAT 0.4 MG SL tablet Place 0.4 mg under the tongue every 5 (five) minutes as needed for chest pain.  01/23/15  Yes Historical Provider, MD  Omega-3 Fatty Acids (FISH OIL) 1000 MG CAPS Take 1 capsule by mouth 2 (two) times daily.   Yes Historical Provider, MD  zolpidem (AMBIEN) 10 MG tablet Take 1 tablet by mouth at bedtime. 05/29/16  Yes Historical Provider, MD  losartan (COZAAR) 100 MG tablet Take 1 tablet (100 mg total) by mouth daily. 05/15/15   Erin R Barrett, PA-C    Family History Family History  Problem Relation Age of Onset  . Diabetes Mother   . Hypertension Mother   . Diabetes Father   . Hypertension Father   . Diabetes Other     Social History Social History  Substance Use Topics  . Smoking status: Never Smoker  . Smokeless tobacco: Former Systems developer    Types: Chew    Quit date: 06/14/2010  . Alcohol use No     Allergies   Shellfish allergy   Review of Systems Review of Systems  Constitutional: Negative for appetite change and fatigue.  HENT: Positive for nosebleeds. Negative for congestion, ear discharge and sinus pressure.   Eyes: Negative for discharge.  Respiratory: Negative for cough.   Cardiovascular: Negative for chest  pain.  Gastrointestinal: Negative for abdominal pain and diarrhea.  Genitourinary: Negative for frequency and hematuria.  Musculoskeletal: Negative for back pain.  Skin: Negative for rash.  Neurological: Negative for seizures and headaches.  Psychiatric/Behavioral: Negative for hallucinations.     Physical Exam Updated Vital Signs BP (!) 201/87   Pulse 80   Temp 97.9 F (36.6 C) (Oral)   Resp 20   Ht 6\' 5"  (1.956 m)   Wt 210 lb (95.3 kg)   SpO2 100%   BMI 24.90 kg/m   Physical Exam  Constitutional: He is oriented to person, place, and time. He appears well-developed.  HENT:  Head: Normocephalic.  Dry blood left nostril.  Eyes: Conjunctivae are normal.  Neck: No tracheal deviation present.  Cardiovascular:  No murmur heard. Musculoskeletal: Normal range of motion.  Neurological: He is oriented to person, place, and time.  Skin: Skin is warm.  Psychiatric: He has a normal mood and affect.     ED Treatments / Results  Labs (all labs ordered are listed, but only abnormal results are displayed) Labs Reviewed - No data to display  EKG  EKG Interpretation None       Radiology No results found.  Procedures Procedures (including critical care time)  Medications Ordered in ED Medications - No data to display   Initial Impression / Assessment and Plan / ED Course  I have reviewed the triage vital signs and the nursing notes.  Pertinent labs & imaging results that were available during my care of the patient were reviewed by me and considered in my medical decision making (see chart for details).  Clinical Course    Patient with a nosebleed that has resolved. Patient will stop taking his aspirin and use Afrin if needed and follow-up with his PCP   Final Clinical Impressions(s) / ED Diagnoses   Final diagnoses:  Epistaxis    New Prescriptions Discharge Medication List as of 06/20/2016  8:12 PM       Milton Ferguson, MD 06/26/16 1003

## 2016-08-17 ENCOUNTER — Encounter: Payer: Self-pay | Admitting: Internal Medicine

## 2016-09-14 ENCOUNTER — Encounter: Payer: Self-pay | Admitting: Nurse Practitioner

## 2016-09-14 ENCOUNTER — Other Ambulatory Visit: Payer: Self-pay

## 2016-09-14 ENCOUNTER — Ambulatory Visit (INDEPENDENT_AMBULATORY_CARE_PROVIDER_SITE_OTHER): Payer: Medicare Other | Admitting: Nurse Practitioner

## 2016-09-14 DIAGNOSIS — Z86010 Personal history of colonic polyps: Secondary | ICD-10-CM | POA: Diagnosis not present

## 2016-09-14 MED ORDER — PEG 3350-KCL-NA BICARB-NACL 420 G PO SOLR: 4000.0000 mL | ORAL | 0 refills | Status: DC

## 2016-09-14 NOTE — Progress Notes (Signed)
CC'ED TO PCP 

## 2016-09-14 NOTE — Assessment & Plan Note (Signed)
The patient has a history of 2 adenomatous colon polyps as per history of present illness. He is currently due for recommended 7 year repeat colonoscopy. He is generally asymptomatic from a GI standpoint. He had open heart surgery 2 years ago with good postop progression. 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 Overall, appears healthy. At this point we will proceed with colonoscopy as previously recommended. Return for follow-up based on postprocedure recommendations.  Proceed with TCS with Dr. Gala Romney in near future: the risks, benefits, and alternatives have been discussed with the patient in detail. The patient states understanding and desires to proceed.  The patient is on Ambien as needed. No other anticoagulants, anxiolytics, chronic pain medications, or antidepressants. Denies alcohol and drug use. Conscious sedation should be adequate for his procedure as it was for his last.   He is on insulin and we'll have him cut his Lantus dose in half the night before.

## 2016-09-14 NOTE — Progress Notes (Signed)
Primary Care Physician:  [facility] Primary Gastroenterologist:  Dr. Gala Romney  Chief Complaint  Patient presents with  . Colonoscopy    7 yr recall, no problems    HPI:   [name] is a 66 y.o. male who presents to schedule 7 year recall colonoscopy. Previous colonoscopy completed 09/17/2009 which found one polyp in the rectum and one polyp in the hepatic flexure. Surgical pathology found the polyps to be adenomatous. Recommended 7 year repeat colonoscopy.  Today he states he's doing well. Denies abdominal pain, N/V, hematochezia, melena, unintentional weight loss, fever, chills. Has a bowel movement about every morning but some foods can "delay it." Occasional diarrhea, otherwise soft. When he is constipated will pass a hard stool and then have loose stools afterward although no straining is required. Denies chest pain, dyspnea, dizziness, lightheadedness, syncope, near syncope. Denies any other upper or lower GI symptoms.  Had CABG about 2 years ago, last saw surgeon 1 year ago. Has not seen cardiology since surgery. PCP manages his cardiac medications.  Past Medical History:  Diagnosis Date  . Arthritis   . CAD (coronary artery disease)    STEMI with LAD stent by [facility] in 2005  . Depression   . Diabetes mellitus without complication ([place])   . History of kidney stones   . Hypertension   . Kidney infection   . MI (mitral incompetence)   . Myocardial infarction   . Seasonal allergies     Past Surgical History:  Procedure Laterality Date  . AV FISTULA PLACEMENT Left 01/30/2014   Procedure: INSERTION OF ARTERIOVENOUS (AV) GORE-TEX GRAFT LEFT UPPER ARM;  Surgeon: Elam Dutch, MD;  Location: [place];  Service: Vascular;  Laterality: Left;  . BACK SURGERY    . CARDIAC CATHETERIZATION N/A 04/01/2015   Procedure: Right/Left Heart Cath and Coronary Angiography;  Surgeon: Leonie Man, MD;  Location: [place] [facility];  Service:  Cardiovascular;  Laterality: N/A;  . COLONOSCOPY W/ POLYPECTOMY    . CORONARY ANGIOPLASTY WITH STENT PLACEMENT  2008  . CORONARY ARTERY BYPASS GRAFT N/A 04/03/2015   Procedure: CORONARY ARTERY BYPASS GRAFTING (CABG) x 4 (LIMA to LAD, SVG to DIAGONAL, SVG to OM1, and SVG to RCA) with EVH from right greater saphenous thigh and partial lower leg vein ;  Surgeon: Ivin Poot, MD;  Location: [place];  Service: Open Heart Surgery;  Laterality: N/A;  . EYE SURGERY Right    lens implant  . INSERTION OF DIALYSIS CATHETER Right 01/30/2014   Procedure: INSERTION OF DIALYSIS CATHETER-RIGHT INTERNAL JUGULAR;  Surgeon: Elam Dutch, MD;  Location: [place];  Service: Vascular;  Laterality: Right;  . SPINE SURGERY    . TEE WITHOUT CARDIOVERSION N/A 04/03/2015   Procedure: TRANSESOPHAGEAL ECHOCARDIOGRAM (TEE);  Surgeon: Ivin Poot, MD;  Location: [place];  Service: Open Heart Surgery;  Laterality: N/A;    Current Outpatient Prescriptions  Medication Sig Dispense Refill  . acetaminophen (TYLENOL) 500 MG tablet Take 500 mg by mouth every 6 (six) hours as needed for mild pain or moderate pain.    Marland Kitchen amLODipine (NORVASC) 10 MG tablet Take 10 mg by mouth daily.     Marland Kitchen aspirin 81 MG tablet Take 81 mg by mouth daily. PT CHANGED TO 81 MG HIMSELF. Does not take some days that he has dialysis    . atorvastatin (LIPITOR) 80 MG tablet Take 1 tablet (80 mg total) by mouth daily at 6 PM.  30 tablet 0  . carvedilol (COREG) 25 MG tablet Take 1 tablet by mouth 2 (two) times daily.    . Cholecalciferol 1000 UNITS tablet Take 1,000 Units by mouth daily.    . cloNIDine (CATAPRES) 0.1 MG tablet Take 0.1 mg by mouth 3 (three) times daily.     . insulin glargine (LANTUS) 100 UNIT/ML injection Inject 30 Units into the skin at bedtime.     . Multiple Vitamin (DAILY VITAMIN PO) Take 1 tablet by mouth daily.    Marland Kitchen NITROSTAT 0.4 MG SL tablet Place 0.4 mg under the tongue every 5 (five) minutes as needed for chest pain.     .  Omega-3 Fatty Acids (FISH OIL) 1000 MG CAPS Take 1 capsule by mouth 2 (two) times daily.    Marland Kitchen zolpidem (AMBIEN) 10 MG tablet Take 1 tablet by mouth at bedtime.     No current facility-administered medications for this visit.     Allergies as of 09/14/2016 - Review Complete 09/14/2016  Allergen Reaction Noted  . Shellfish allergy Hives 01/30/2014    Family History  Problem Relation Age of Onset  . Diabetes Mother   . Hypertension Mother   . Diabetes Father   . Hypertension Father   . Diabetes Other     Social History   Social History  . Marital status: Married    Spouse name: N/A  . Number of children: N/A  . Years of education: N/A   Occupational History  . Not on file.   Social History Main Topics  . Smoking status: Never Smoker  . Smokeless tobacco: Former Systems developer    Types: Chew    Quit date: 06/14/2010  . Alcohol use No  . Drug use: No  . Sexual activity: Not on file   Other Topics Concern  . Not on file   Social History Narrative  . No narrative on file    Review of Systems: General: Negative for anorexia, weight loss, fever, chills, fatigue, weakness. ENT: Negative for hoarseness, difficulty swallowing. CV: Negative for chest pain, angina, palpitations, peripheral edema.  Respiratory: Negative for dyspnea at rest, cough, sputum, wheezing.  GI: See history of present illness. GU:  On hemodialysis.   Derm: Negative for rash or itching.  Endo: Negative for unusual weight change.  Heme: Negative for bruising or bleeding. Allergy: Negative for rash or hives.    Physical Exam: BP (!) 189/85   Pulse 72   Temp 97.8 F (36.6 C) (Oral)   Ht 6\' 5"  (1.956 m)   Wt 211 lb 6.4 oz (95.9 kg)   BMI 25.07 kg/m  General:   Alert and oriented. Pleasant and cooperative. Well-nourished and well-developed.  Head:  Normocephalic and atraumatic. Eyes:  Without icterus, sclera clear and conjunctiva pink.  Ears:  Normal auditory acuity. Cardiovascular:  S1, S2 present  without murmurs appreciated. Normal pulses noted. Extremities without clubbing or edema. Respiratory:  Clear to auscultation bilaterally. No wheezes, rales, or rhonchi. No distress.  Gastrointestinal:  +BS, soft, non-tender and non-distended. No HSM noted. No guarding or rebound. No masses appreciated.  Rectal:  Deferred  Musculoskalatal:  Symmetrical without gross deformities.  Skin:  Intact without significant lesions or rashes. Left forearm AV fistula noted + bruit/+ thrill. Neurologic:  Alert and oriented x4;  grossly normal neurologically. Psych:  Alert and cooperative. Normal mood and affect. Heme/Lymph/Immune: No excessive bruising noted.    09/14/2016 8:33 AM   Disclaimer: This note was dictated with voice recognition software. Similar sounding  words can inadvertently be transcribed and may not be corrected upon review.

## 2016-09-14 NOTE — Patient Instructions (Signed)
1. We will schedule your procedure for you. 2. Further recommendations to be made based on the results of your procedure. 3. Return for follow-up based on postprocedure recommendations.

## 2016-09-15 NOTE — Patient Instructions (Signed)
Submitted PA info for TCS via [facility] website. No PA needed. Decision ID# [id number].

## 2016-10-12 ENCOUNTER — Ambulatory Visit (HOSPITAL_COMMUNITY)
Admission: RE | Admit: 2016-10-12 | Discharge: 2016-10-12 | Disposition: A | Discharge status: Home / Self Care | Payer: Medicare Other | Source: Ambulatory Visit | Attending: Internal Medicine | Admitting: Internal Medicine

## 2016-10-12 ENCOUNTER — Encounter (HOSPITAL_COMMUNITY)
Admission: RE | Disposition: A | Discharge status: Home / Self Care | Payer: Self-pay | Source: Ambulatory Visit | Attending: Internal Medicine

## 2016-10-12 ENCOUNTER — Encounter (HOSPITAL_COMMUNITY): Payer: Self-pay | Admitting: *Deleted

## 2016-10-12 DIAGNOSIS — E119 Type 2 diabetes mellitus without complications: Secondary | ICD-10-CM | POA: Diagnosis not present

## 2016-10-12 DIAGNOSIS — Z86010 Personal history of colonic polyps: Secondary | ICD-10-CM | POA: Diagnosis not present

## 2016-10-12 DIAGNOSIS — F329 Major depressive disorder, single episode, unspecified: Secondary | ICD-10-CM | POA: Diagnosis not present

## 2016-10-12 DIAGNOSIS — I2510 Atherosclerotic heart disease of native coronary artery without angina pectoris: Secondary | ICD-10-CM | POA: Diagnosis not present

## 2016-10-12 DIAGNOSIS — D123 Benign neoplasm of transverse colon: Secondary | ICD-10-CM | POA: Insufficient documentation

## 2016-10-12 DIAGNOSIS — I10 Essential (primary) hypertension: Secondary | ICD-10-CM | POA: Diagnosis not present

## 2016-10-12 DIAGNOSIS — Z7982 Long term (current) use of aspirin: Secondary | ICD-10-CM | POA: Diagnosis not present

## 2016-10-12 DIAGNOSIS — Z951 Presence of aortocoronary bypass graft: Secondary | ICD-10-CM | POA: Insufficient documentation

## 2016-10-12 DIAGNOSIS — M1990 Unspecified osteoarthritis, unspecified site: Secondary | ICD-10-CM | POA: Diagnosis not present

## 2016-10-12 DIAGNOSIS — Z1211 Encounter for screening for malignant neoplasm of colon: Secondary | ICD-10-CM | POA: Diagnosis present

## 2016-10-12 DIAGNOSIS — I252 Old myocardial infarction: Secondary | ICD-10-CM | POA: Insufficient documentation

## 2016-10-12 DIAGNOSIS — Z79899 Other long term (current) drug therapy: Secondary | ICD-10-CM | POA: Insufficient documentation

## 2016-10-12 DIAGNOSIS — Z955 Presence of coronary angioplasty implant and graft: Secondary | ICD-10-CM | POA: Diagnosis not present

## 2016-10-12 DIAGNOSIS — Z87442 Personal history of urinary calculi: Secondary | ICD-10-CM | POA: Diagnosis not present

## 2016-10-12 DIAGNOSIS — Z794 Long term (current) use of insulin: Secondary | ICD-10-CM | POA: Insufficient documentation

## 2016-10-12 DIAGNOSIS — D125 Benign neoplasm of sigmoid colon: Secondary | ICD-10-CM | POA: Insufficient documentation

## 2016-10-12 HISTORY — PX: COLONOSCOPY: SHX5424

## 2016-10-12 HISTORY — PX: POLYPECTOMY: SHX5525

## 2016-10-12 LAB — GLUCOSE, CAPILLARY
Glucose-Capillary: 109 mg/dL — ABNORMAL HIGH (ref 65–99)
Glucose-Capillary: 87 mg/dL (ref 65–99)

## 2016-10-12 SURGERY — COLONOSCOPY
Anesthesia: Moderate Sedation

## 2016-10-12 MED ORDER — DEXTROSE 50 % IV SOLN
INTRAVENOUS | Status: AC
  Filled 2016-10-12: qty 50

## 2016-10-12 MED ORDER — MIDAZOLAM HCL 5 MG/5ML IJ SOLN
INTRAMUSCULAR | Status: DC | PRN
  Administered 2016-10-12 (×2): 1 mg via INTRAVENOUS
  Administered 2016-10-12: 2 mg via INTRAVENOUS

## 2016-10-12 MED ORDER — STERILE WATER FOR IRRIGATION IR SOLN
Status: DC | PRN
  Administered 2016-10-12: 15:00:00

## 2016-10-12 MED ORDER — ONDANSETRON HCL 4 MG/2ML IJ SOLN
INTRAMUSCULAR | Status: AC
  Filled 2016-10-12: qty 2

## 2016-10-12 MED ORDER — MEPERIDINE HCL 100 MG/ML IJ SOLN
INTRAMUSCULAR | Status: DC
Start: 2016-10-12 — End: 2016-10-12
  Filled 2016-10-12: qty 2

## 2016-10-12 MED ORDER — DEXTROSE 50 % IV SOLN
INTRAVENOUS | Status: DC | PRN
  Administered 2016-10-12: 12.5 g via INTRAVENOUS

## 2016-10-12 MED ORDER — MIDAZOLAM HCL 5 MG/5ML IJ SOLN
INTRAMUSCULAR | Status: AC
  Filled 2016-10-12: qty 10

## 2016-10-12 MED ORDER — MEPERIDINE HCL 100 MG/ML IJ SOLN
INTRAMUSCULAR | Status: DC | PRN
  Administered 2016-10-12: 25 mg via INTRAVENOUS
  Administered 2016-10-12: 50 mg via INTRAVENOUS

## 2016-10-12 MED ORDER — SODIUM CHLORIDE 0.9 % IV SOLN: INTRAVENOUS | Status: DC

## 2016-10-12 MED ORDER — ONDANSETRON HCL 4 MG/2ML IJ SOLN
INTRAMUSCULAR | Status: DC | PRN
Start: 2016-10-12 — End: 2016-10-12
  Administered 2016-10-12: 4 mg via INTRAVENOUS

## 2016-10-12 MED ORDER — MEPERIDINE HCL 100 MG/ML IJ SOLN
INTRAMUSCULAR | Status: AC
  Filled 2016-10-12: qty 2

## 2016-10-12 NOTE — Op Note (Signed)
[facility] Patient Name: [name] Procedure Date: 10/12/2016 2:17 PM MRN: [id number] Date of Birth: [birth year] Attending MD: Norvel Richards , MD CSN: [id number] Age: 66 Admit Type: Outpatient Procedure:                Colonoscopy with multiple snare polypectomies Indications:              High risk colon cancer surveillance: Personal                            history of colonic polyps Providers:                Norvel Richards, MD, Otis Peak B. Sharon Seller, RN,                            Randa Spike, Technician Referring MD:              Medicines:                Midazolam 4 mg IV, Meperidine 75 mg IV Complications:            No immediate complications. Estimated Blood Loss:     Estimated blood loss was minimal. Procedure:                Pre-Anesthesia Assessment:                           - Prior to the procedure, a History and Physical                            was performed, and patient medications and                            allergies were reviewed. The patient's tolerance of                            previous anesthesia was also reviewed. The risks                            and benefits of the procedure and the sedation                            options and risks were discussed with the patient.                            All questions were answered, and informed consent                            was obtained. Prior Anticoagulants: The patient has                            taken no previous anticoagulant or antiplatelet                            agents. ASA Grade Assessment: II - A patient with  mild systemic disease. After reviewing the risks                            and benefits, the patient was deemed in                            satisfactory condition to undergo the procedure.                           After obtaining informed consent, the colonoscope                            was passed under direct vision.  Throughout the                            procedure, the patient's blood pressure, pulse, and                            oxygen saturations were monitored continuously. The                            EC-[id number] ([id number]) scope was introduced through                            the anus and advanced to the the cecum, identified                            by appendiceal orifice and ileocecal valve. The                            colonoscopy was performed without difficulty. The                            patient tolerated the procedure well. The entire                            colon was well visualized. The ileocecal valve,                            appendiceal orifice, and rectum were photographed.                            The colonoscopy was performed without difficulty.                            The quality of the bowel preparation was adequate. Scope In: 2:42:10 PM Scope Out: 3:04:23 PM Scope Withdrawal Time: 0 hours 11 minutes 30 seconds  Total Procedure Duration: 0 hours 22 minutes 13 seconds  Findings:      The perianal and digital rectal examinations were normal.      Three sessile polyps were found in the sigmoid colon, splenic flexure       and hepatic flexure. The polyps were 4 to 6 mm in size. These polyps       were removed with  a cold snare. Resection and retrieval were complete.       Estimated blood loss was minimal.      The exam was otherwise without abnormality. Impression:               - Three 4 to 6 mm polyps in the sigmoid colon, at                            the splenic flexure and at the hepatic flexure,                            removed with a cold snare. Resected and retrieved.                           - The examination was otherwise normal. Moderate Sedation:      Moderate (conscious) sedation was administered by the endoscopy nurse       and supervised by the endoscopist. The following parameters were       monitored: oxygen saturation, heart rate, blood  pressure, respiratory       rate, EKG, adequacy of pulmonary ventilation, and response to care.       Total physician intraservice time was 32 minutes. Recommendation:           - Patient has a contact number available for                            emergencies. The signs and symptoms of potential                            delayed complications were discussed with the                            patient. Return to normal activities tomorrow.                            Written discharge instructions were provided to the                            patient.                           - Resume previous diet.                           - Continue present medications.                           - Repeat colonoscopy date to be determined after                            pending pathology results are reviewed for                            surveillance based on pathology results.                           -  Return to GI clinic (date not yet determined). Procedure Code(s):        --- Professional ---                           [phone], Colonoscopy, flexible; with removal of                            tumor(s), polyp(s), or other lesion(s) by snare                            technique                           99152, Moderate sedation services provided by the                            same physician or other qualified health care                            professional performing the diagnostic or                            therapeutic service that the sedation supports,                            requiring the presence of an independent trained                            observer to assist in the monitoring of the                            patient's level of consciousness and physiological                            status; initial 15 minutes of intraservice time,                            patient age 65 years or older                           [phone], Moderate sedation services; each additional                             15 minutes intraservice time Diagnosis Code(s):        --- Professional ---                           Z86.010, Personal history of colonic polyps                           D12.5, Benign neoplasm of sigmoid colon                           D12.3, Benign neoplasm of transverse colon (hepatic  flexure or splenic flexure) CPT copyright 2016 American Medical Association. All rights reserved. The codes documented in this report are preliminary and upon coder review may  be revised to meet current compliance requirements. Cristopher Estimable. Latrell Reitan, MD Norvel Richards, MD 10/12/2016 3:11:01 PM This report has been signed electronically. Number of Addenda: 0

## 2016-10-12 NOTE — Discharge Instructions (Addendum)
Colon polyp information provided  Further recommendations to follow pending review of pathology report Colonoscopy Discharge Instructions  Read the instructions outlined below and refer to this sheet in the next few weeks. These discharge instructions provide you with general information on caring for yourself after you leave the hospital. Your doctor may also give you specific instructions. While your treatment has been planned according to the most current medical practices available, unavoidable complications occasionally occur. If you have any problems or questions after discharge, call Dr. Gala Romney at [phone]. ACTIVITY  You may resume your regular activity, but move at a slower pace for the next 24 hours.   Take frequent rest periods for the next 24 hours.   Walking will help get rid of the air and reduce the bloated feeling in your belly (abdomen).   No driving for 24 hours (because of the medicine (anesthesia) used during the test).    Do not sign any important legal documents or operate any machinery for 24 hours (because of the anesthesia used during the test).  NUTRITION  Drink plenty of fluids.   You may resume your normal diet as instructed by your doctor.   Begin with a light meal and progress to your normal diet. Heavy or fried foods are harder to digest and may make you feel sick to your stomach (nauseated).   Avoid alcoholic beverages for 24 hours or as instructed.  MEDICATIONS  You may resume your normal medications unless your doctor tells you otherwise.  WHAT YOU CAN EXPECT TODAY  Some feelings of bloating in the abdomen.   Passage of more gas than usual.   Spotting of blood in your stool or on the toilet paper.  IF YOU HAD POLYPS REMOVED DURING THE COLONOSCOPY:  No aspirin products for 7 days or as instructed.   No alcohol for 7 days or as instructed.   Eat a soft diet for the next 24 hours.  FINDING OUT THE RESULTS OF YOUR TEST Not all test results are  available during your visit. If your test results are not back during the visit, make an appointment with your caregiver to find out the results. Do not assume everything is normal if you have not heard from your caregiver or the medical facility. It is important for you to follow up on all of your test results.  SEEK IMMEDIATE MEDICAL ATTENTION IF:  You have more than a spotting of blood in your stool.   Your belly is swollen (abdominal distention).   You are nauseated or vomiting.   You have a temperature over 101.   You have abdominal pain or discomfort that is severe or gets worse throughout the day.     Colon Polyps Polyps are tissue growths inside the body. Polyps can grow in many places, including the large intestine (colon). A polyp may be a round bump or a mushroom-shaped growth. You could have one polyp or several. Most colon polyps are noncancerous (benign). However, some colon polyps can become cancerous over time. What are the causes? The exact cause of colon polyps is not known. What increases the risk? This condition is more likely to develop in people who:  Have a family history of colon cancer or colon polyps.  Are older than 89 or older than 45 if they are African American.  Have inflammatory bowel disease, such as ulcerative colitis or Crohn disease.  Are overweight.  Smoke cigarettes.  Do not get enough exercise.  Drink too much alcohol.  Eat a diet that is:  High in fat and red meat.  Low in fiber.  Had childhood cancer that was treated with abdominal radiation. What are the signs or symptoms? Most polyps do not cause symptoms. If you have symptoms, they may include:  Blood coming from your rectum when having a bowel movement.  Blood in your stool.The stool may look dark red or black.  A change in bowel habits, such as constipation or diarrhea. How is this diagnosed? This condition is diagnosed with a colonoscopy. This is a procedure that  uses a lighted, flexible scope to look at the inside of your colon. How is this treated? Treatment for this condition involves removing any polyps that are found. Those polyps will then be tested for cancer. If cancer is found, your health care provider will talk to you about options for colon cancer treatment. Follow these instructions at home: Diet   Eat plenty of fiber, such as fruits, vegetables, and whole grains.  Eat foods that are high in calcium and vitamin D, such as milk, cheese, yogurt, eggs, liver, fish, and broccoli.  Limit foods high in fat, red meats, and processed meats, such as hot dogs, sausage, bacon, and lunch meats.  Maintain a healthy weight, or lose weight if recommended by your health care provider. General instructions   Do not smoke cigarettes.  Do not drink alcohol excessively.  Keep all follow-up visits as told by your health care provider. This is important. This includes keeping regularly scheduled colonoscopies. Talk to your health care provider about when you need a colonoscopy.  Exercise every day or as told by your health care provider. Contact a health care provider if:  You have new or worsening bleeding during a bowel movement.  You have new or increased blood in your stool.  You have a change in bowel habits.  You unexpectedly lose weight. This information is not intended to replace advice given to you by your health care provider. Make sure you discuss any questions you have with your health care provider. Document Released: 02/25/2004 Document Revised: 11/06/2015 Document Reviewed: 04/21/2015 Elsevier Interactive Patient Education  2017 [employer].

## 2016-10-12 NOTE — Progress Notes (Addendum)
Patients BP 232/94 HR 86. Patient did not take any medications this morning. Discussed with Dr. Gala Romney. Coreg, Norvasc and Clonidine, patients own medications from home, taken at this time with a small sip of water as directed by Dr. Gala Romney. Will monitor BP.

## 2016-10-12 NOTE — H&P (View-Only) (Signed)
Primary Care Physician:  [facility] Primary Gastroenterologist:  Dr. Gala Romney  Chief Complaint  Patient presents with  . Colonoscopy    7 yr recall, no problems    HPI:   [name] is a 66 y.o. male who presents to schedule 7 year recall colonoscopy. Previous colonoscopy completed 09/17/2009 which found one polyp in the rectum and one polyp in the hepatic flexure. Surgical pathology found the polyps to be adenomatous. Recommended 7 year repeat colonoscopy.  Today he states he's doing well. Denies abdominal pain, N/V, hematochezia, melena, unintentional weight loss, fever, chills. Has a bowel movement about every morning but some foods can "delay it." Occasional diarrhea, otherwise soft. When he is constipated will pass a hard stool and then have loose stools afterward although no straining is required. Denies chest pain, dyspnea, dizziness, lightheadedness, syncope, near syncope. Denies any other upper or lower GI symptoms.  Had CABG about 2 years ago, last saw surgeon 1 year ago. Has not seen cardiology since surgery. PCP manages his cardiac medications.  Past Medical History:  Diagnosis Date  . Arthritis   . CAD (coronary artery disease)    STEMI with LAD stent by C Granger in 2005  . Depression   . Diabetes mellitus without complication ([place])   . History of kidney stones   . Hypertension   . Kidney infection   . MI (mitral incompetence)   . Myocardial infarction   . Seasonal allergies     Past Surgical History:  Procedure Laterality Date  . AV FISTULA PLACEMENT Left 01/30/2014   Procedure: INSERTION OF ARTERIOVENOUS (AV) GORE-TEX GRAFT LEFT UPPER ARM;  Surgeon: Elam Dutch, MD;  Location: [place];  Service: Vascular;  Laterality: Left;  . BACK SURGERY    . CARDIAC CATHETERIZATION N/A 04/01/2015   Procedure: Right/Left Heart Cath and Coronary Angiography;  Surgeon: Leonie Man, MD;  Location: [place] [facility];  Service:  Cardiovascular;  Laterality: N/A;  . COLONOSCOPY W/ POLYPECTOMY    . CORONARY ANGIOPLASTY WITH STENT PLACEMENT  2008  . CORONARY ARTERY BYPASS GRAFT N/A 04/03/2015   Procedure: CORONARY ARTERY BYPASS GRAFTING (CABG) x 4 (LIMA to LAD, SVG to DIAGONAL, SVG to OM1, and SVG to RCA) with EVH from right greater saphenous thigh and partial lower leg vein ;  Surgeon: Ivin Poot, MD;  Location: [place];  Service: Open Heart Surgery;  Laterality: N/A;  . EYE SURGERY Right    lens implant  . INSERTION OF DIALYSIS CATHETER Right 01/30/2014   Procedure: INSERTION OF DIALYSIS CATHETER-RIGHT INTERNAL JUGULAR;  Surgeon: Elam Dutch, MD;  Location: [place];  Service: Vascular;  Laterality: Right;  . SPINE SURGERY    . TEE WITHOUT CARDIOVERSION N/A 04/03/2015   Procedure: TRANSESOPHAGEAL ECHOCARDIOGRAM (TEE);  Surgeon: Ivin Poot, MD;  Location: [place];  Service: Open Heart Surgery;  Laterality: N/A;    Current Outpatient Prescriptions  Medication Sig Dispense Refill  . acetaminophen (TYLENOL) 500 MG tablet Take 500 mg by mouth every 6 (six) hours as needed for mild pain or moderate pain.    Marland Kitchen amLODipine (NORVASC) 10 MG tablet Take 10 mg by mouth daily.     Marland Kitchen aspirin 81 MG tablet Take 81 mg by mouth daily. PT CHANGED TO 81 MG HIMSELF. Does not take some days that he has dialysis    . atorvastatin (LIPITOR) 80 MG tablet Take 1 tablet (80 mg total) by mouth daily at 6 PM.  30 tablet 0  . carvedilol (COREG) 25 MG tablet Take 1 tablet by mouth 2 (two) times daily.    . Cholecalciferol 1000 UNITS tablet Take 1,000 Units by mouth daily.    . cloNIDine (CATAPRES) 0.1 MG tablet Take 0.1 mg by mouth 3 (three) times daily.     . insulin glargine (LANTUS) 100 UNIT/ML injection Inject 30 Units into the skin at bedtime.     . Multiple Vitamin (DAILY VITAMIN PO) Take 1 tablet by mouth daily.    Marland Kitchen NITROSTAT 0.4 MG SL tablet Place 0.4 mg under the tongue every 5 (five) minutes as needed for chest pain.     .  Omega-3 Fatty Acids (FISH OIL) 1000 MG CAPS Take 1 capsule by mouth 2 (two) times daily.    Marland Kitchen zolpidem (AMBIEN) 10 MG tablet Take 1 tablet by mouth at bedtime.     No current facility-administered medications for this visit.     Allergies as of 09/14/2016 - Review Complete 09/14/2016  Allergen Reaction Noted  . Shellfish allergy Hives 01/30/2014    Family History  Problem Relation Age of Onset  . Diabetes Mother   . Hypertension Mother   . Diabetes Father   . Hypertension Father   . Diabetes Other     Social History   Social History  . Marital status: Married    Spouse name: N/A  . Number of children: N/A  . Years of education: N/A   Occupational History  . Not on file.   Social History Main Topics  . Smoking status: Never Smoker  . Smokeless tobacco: Former Systems developer    Types: Chew    Quit date: 06/14/2010  . Alcohol use No  . Drug use: No  . Sexual activity: Not on file   Other Topics Concern  . Not on file   Social History Narrative  . No narrative on file    Review of Systems: General: Negative for anorexia, weight loss, fever, chills, fatigue, weakness. ENT: Negative for hoarseness, difficulty swallowing. CV: Negative for chest pain, angina, palpitations, peripheral edema.  Respiratory: Negative for dyspnea at rest, cough, sputum, wheezing.  GI: See history of present illness. GU:  On hemodialysis.   Derm: Negative for rash or itching.  Endo: Negative for unusual weight change.  Heme: Negative for bruising or bleeding. Allergy: Negative for rash or hives.    Physical Exam: BP (!) 189/85   Pulse 72   Temp 97.8 F (36.6 C) (Oral)   Ht 6\' 5"  (1.956 m)   Wt 211 lb 6.4 oz (95.9 kg)   BMI 25.07 kg/m  General:   Alert and oriented. Pleasant and cooperative. Well-nourished and well-developed.  Head:  Normocephalic and atraumatic. Eyes:  Without icterus, sclera clear and conjunctiva pink.  Ears:  Normal auditory acuity. Cardiovascular:  S1, S2 present  without murmurs appreciated. Normal pulses noted. Extremities without clubbing or edema. Respiratory:  Clear to auscultation bilaterally. No wheezes, rales, or rhonchi. No distress.  Gastrointestinal:  +BS, soft, non-tender and non-distended. No HSM noted. No guarding or rebound. No masses appreciated.  Rectal:  Deferred  Musculoskalatal:  Symmetrical without gross deformities.  Skin:  Intact without significant lesions or rashes. Left forearm AV fistula noted + bruit/+ thrill. Neurologic:  Alert and oriented x4;  grossly normal neurologically. Psych:  Alert and cooperative. Normal mood and affect. Heme/Lymph/Immune: No excessive bruising noted.    09/14/2016 8:33 AM   Disclaimer: This note was dictated with voice recognition software. Similar sounding  words can inadvertently be transcribed and may not be corrected upon review.

## 2016-10-12 NOTE — Interval H&P Note (Signed)
History and Physical Interval Note:  10/12/2016 2:26 PM  [name]  has presented today for surgery, with the diagnosis of history polyps  The various methods of treatment have been discussed with the patient and family. After consideration of risks, benefits and other options for treatment, the patient has consented to  Procedure(s) with comments: COLONOSCOPY (N/A) - 2:00pm as a surgical intervention .  The patient's history has been reviewed, patient examined, no change in status, stable for surgery.  I have reviewed the patient's chart and labs.  Questions were answered to the patient's satisfaction.    No change. Surveillance colonoscopy per plan. The risks, benefits, limitations, alternatives and imponderables have been reviewed with the patient. Questions have been answered. All parties are agreeable.  Manus Rudd

## 2016-10-14 ENCOUNTER — Encounter (HOSPITAL_COMMUNITY): Payer: Self-pay | Admitting: Internal Medicine

## 2016-10-17 ENCOUNTER — Encounter: Payer: Self-pay | Admitting: Internal Medicine

## 2016-11-21 DIAGNOSIS — E113299 Type 2 diabetes mellitus with mild nonproliferative diabetic retinopathy without macular edema, unspecified eye: Secondary | ICD-10-CM | POA: Insufficient documentation

## 2016-11-21 DIAGNOSIS — Z961 Presence of intraocular lens: Secondary | ICD-10-CM | POA: Insufficient documentation

## 2017-07-05 DIAGNOSIS — I150 Renovascular hypertension: Secondary | ICD-10-CM | POA: Insufficient documentation

## 2018-07-28 ENCOUNTER — Emergency Department (HOSPITAL_COMMUNITY)
Admission: EM | Admit: 2018-07-28 | Discharge: 2018-07-29 | Disposition: A | Discharge status: Home / Self Care | Payer: Medicare Other | Attending: Emergency Medicine | Admitting: Emergency Medicine

## 2018-07-28 ENCOUNTER — Emergency Department (HOSPITAL_COMMUNITY): Payer: Medicare Other

## 2018-07-28 ENCOUNTER — Encounter (HOSPITAL_COMMUNITY): Payer: Self-pay | Admitting: *Deleted

## 2018-07-28 ENCOUNTER — Other Ambulatory Visit: Payer: Self-pay

## 2018-07-28 DIAGNOSIS — I2510 Atherosclerotic heart disease of native coronary artery without angina pectoris: Secondary | ICD-10-CM | POA: Insufficient documentation

## 2018-07-28 DIAGNOSIS — Z794 Long term (current) use of insulin: Secondary | ICD-10-CM | POA: Insufficient documentation

## 2018-07-28 DIAGNOSIS — J40 Bronchitis, not specified as acute or chronic: Secondary | ICD-10-CM | POA: Insufficient documentation

## 2018-07-28 DIAGNOSIS — Z79899 Other long term (current) drug therapy: Secondary | ICD-10-CM | POA: Diagnosis not present

## 2018-07-28 DIAGNOSIS — I132 Hypertensive heart and chronic kidney disease with heart failure and with stage 5 chronic kidney disease, or end stage renal disease: Secondary | ICD-10-CM | POA: Insufficient documentation

## 2018-07-28 DIAGNOSIS — I5031 Acute diastolic (congestive) heart failure: Secondary | ICD-10-CM | POA: Insufficient documentation

## 2018-07-28 DIAGNOSIS — E119 Type 2 diabetes mellitus without complications: Secondary | ICD-10-CM | POA: Diagnosis not present

## 2018-07-28 DIAGNOSIS — R05 Cough: Secondary | ICD-10-CM | POA: Diagnosis present

## 2018-07-28 DIAGNOSIS — N186 End stage renal disease: Secondary | ICD-10-CM | POA: Diagnosis not present

## 2018-07-28 NOTE — ED Provider Notes (Signed)
[facility] EMERGENCY DEPARTMENT Provider Note   CSN: [id number] Arrival date & time: 07/28/18  2305     History   Chief Complaint Chief Complaint  Patient presents with  . Cough    HPI [name] is a 68 y.o. male.  Patient with ESRD on dialysis, CAD status post CABG, diabetes, hypertension presenting with a one-week history of nonproductive cough, rhinorrhea and congestion.  He has been taking Tylenol Sinus at home without relief.  Denies any history of COPD, asthma or smoking.  States his cough is productive of clear and yellow mucus.  He said nasal congestion of clear discharge.  He came in tonight because when he woke up from a nap he felt short of breath but this has since resolved.  States he been having intermittent shortness of breath over the past several days worse in the mornings.  He is able to lie flat without difficulty.  He denies any missed dialysis sessions and he normally goes Monday Wednesday Friday and went today.  He still makes some urine.  No leg pain or leg swelling. Has had sick contacts at home.  No chest pain or abdominal pain.  States his breathing becomes worse when he goes outside in the cold weather.  The history is provided by the patient.  Cough  Associated symptoms: rhinorrhea   Associated symptoms: no chest pain, no fever, no headaches, no myalgias and no sore throat     Past Medical History:  Diagnosis Date  . Arthritis   . CAD (coronary artery disease)    STEMI with LAD stent by C Granger in 2005  . Depression   . Diabetes mellitus without complication ([place])   . History of kidney stones   . Hypertension   . Kidney infection   . MI (mitral incompetence)   . Myocardial infarction ([place])   . Seasonal allergies     Patient Active Problem List   Diagnosis Date Noted  . History of adenomatous polyp of colon 09/14/2016  . CAD (coronary artery disease)   . Secondary hypertension, unspecified   . Acute diastolic heart failure ([place])   .  SOB (shortness of breath) 03/31/2015  . Volume overload 03/31/2015  . NSTEMI (non-ST elevated myocardial infarction) ([place]) 03/31/2015  . ESRD on dialysis ([place])   . Hypoxia 03/21/2015  . Fluid overload 03/21/2015  . End stage renal disease ([place]) 03/07/2014  . Chronic kidney disease 01/29/2014  . Acute on chronic renal failure ([place]) 01/14/2014  . Anemia of chronic disease 01/14/2014  . Hyponatremia 01/14/2014  . Hyperkalemia 01/14/2014  . Type 2 diabetes mellitus with diabetic nephropathy ([place]) 09/02/2009  . HYPERLIPIDEMIA 09/02/2009  . Essential hypertension 09/02/2009  . Coronary atherosclerosis 09/02/2009    Past Surgical History:  Procedure Laterality Date  . AV FISTULA PLACEMENT Left 01/30/2014   Procedure: INSERTION OF ARTERIOVENOUS (AV) GORE-TEX GRAFT LEFT UPPER ARM;  Surgeon: Elam Dutch, MD;  Location: [place];  Service: Vascular;  Laterality: Left;  . BACK SURGERY    . CARDIAC CATHETERIZATION N/A 04/01/2015   Procedure: Right/Left Heart Cath and Coronary Angiography;  Surgeon: Leonie Man, MD;  Location: [place] [facility];  Service: Cardiovascular;  Laterality: N/A;  . COLONOSCOPY N/A 10/12/2016   Procedure: COLONOSCOPY;  Surgeon: Daneil Dolin, MD;  [facility];  Service: Endoscopy;  Laterality: N/A;  2:00pm  . COLONOSCOPY W/ POLYPECTOMY    . CORONARY ANGIOPLASTY WITH STENT PLACEMENT  2008  . CORONARY ARTERY BYPASS GRAFT  N/A 04/03/2015   Procedure: CORONARY ARTERY BYPASS GRAFTING (CABG) x 4 (LIMA to LAD, SVG to DIAGONAL, SVG to OM1, and SVG to RCA) with EVH from right greater saphenous thigh and partial lower leg vein ;  Surgeon: Ivin Poot, MD;  Location: [place];  Service: Open Heart Surgery;  Laterality: N/A;  . EYE SURGERY Right    lens implant  . INSERTION OF DIALYSIS CATHETER Right 01/30/2014   Procedure: INSERTION OF DIALYSIS CATHETER-RIGHT INTERNAL JUGULAR;  Surgeon: Elam Dutch, MD;  Location: [place];  Service: Vascular;  Laterality: Right;   . POLYPECTOMY  10/12/2016   Procedure: POLYPECTOMY;  Surgeon: Daneil Dolin, MD;  [facility];  Service: Endoscopy;;  colon  . SPINE SURGERY    . TEE WITHOUT CARDIOVERSION N/A 04/03/2015   Procedure: TRANSESOPHAGEAL ECHOCARDIOGRAM (TEE);  Surgeon: Ivin Poot, MD;  Location: [place];  Service: Open Heart Surgery;  Laterality: N/A;        Home Medications    Prior to Admission medications   Medication Sig Start Date End Date Taking? Authorizing Provider  acetaminophen (TYLENOL) 500 MG tablet Take 500 mg by mouth every 6 (six) hours as needed for mild pain or moderate pain.    [provider]  amLODipine (NORVASC) 10 MG tablet Take 10 mg by mouth daily.  01/13/14   [provider]  aspirin EC 325 MG tablet Take 325mg s once daily on non dialysis days    [provider]  atorvastatin (LIPITOR) 80 MG tablet Take 1 tablet (80 mg total) by mouth daily at 6 PM. 04/10/15   Nani Skillern, PA-C  calcium acetate (PHOSLO) 667 MG capsule Take 667 mg by mouth 3 (three) times daily with meals.    [provider]  carvedilol (COREG) 25 MG tablet Take 25 mg by mouth 2 (two) times daily.  02/27/15   [provider]  cloNIDine (CATAPRES) 0.1 MG tablet Take 0.1 mg by mouth 3 (three) times daily.  11/07/13   [provider]  furosemide (LASIX) 20 MG tablet Take 60 mg by mouth daily. Doesn't take on Dialysis days:Mon, Wed, and Fri    [provider]  insulin glargine (LANTUS) 100 UNIT/ML injection Inject 25 Units into the skin at bedtime.     [provider]  latanoprost (XALATAN) 0.005 % ophthalmic solution Place 1 drop into both eyes at bedtime. 08/27/16   [provider]  magnesium hydroxide (MILK OF MAGNESIA) 400 MG/5ML suspension Take 15 mLs by mouth daily as needed for mild constipation.    [provider]  Multiple Vitamin (DAILY VITAMIN PO) Take 1 tablet by mouth daily.    [provider]    NITROSTAT 0.4 MG SL tablet Place 0.4 mg under the tongue every 5 (five) minutes as needed for chest pain.  01/23/15   [provider]  Omega-3 Fatty Acids (FISH OIL) 1000 MG CAPS Take 1,000 mg by mouth 2 (two) times daily.     [provider]  polyethylene glycol-electrolytes (TRILYTE) 420 g solution Take 4,000 mLs by mouth as directed. 09/14/16   Rourk, Cristopher Estimable, MD  zolpidem (AMBIEN) 10 MG tablet Take 10 mg by mouth at bedtime as needed for sleep.  05/29/16   [provider]    Family History Family History  Problem Relation Age of Onset  . Diabetes Mother   . Hypertension Mother   . Diabetes Father   . Hypertension Father   . Diabetes Other  Social History Social History   Tobacco Use  . Smoking status: Never Smoker  . Smokeless tobacco: Former Systems developer    Types: Chew  Substance Use Topics  . Alcohol use: No  . Drug use: No     Allergies   Shellfish allergy   Review of Systems Review of Systems  Constitutional: Negative for activity change, appetite change and fever.  HENT: Positive for congestion and rhinorrhea. Negative for sore throat.   Eyes: Negative for visual disturbance.  Respiratory: Positive for cough.   Cardiovascular: Negative for chest pain.  Gastrointestinal: Negative for abdominal pain, nausea and vomiting.  Genitourinary: Negative for dysuria and hematuria.  Musculoskeletal: Negative for arthralgias, back pain and myalgias.  Skin: Negative for wound.  Neurological: Negative for dizziness, weakness and headaches.    all other systems are negative except as noted in the HPI and PMH.    Physical Exam Updated Vital Signs BP (!) 176/61 (BP Location: Right Arm)   Pulse 84   Temp 98 F (36.7 C) (Oral)   Resp 18   Ht 6\' 5"  (1.956 m)   Wt 95.3 kg   SpO2 100%   BMI 24.90 kg/m   Physical Exam Vitals signs and nursing note reviewed.  Constitutional:      General: He is not in acute distress.    Appearance: Normal  appearance. He is well-developed and normal weight. He is not ill-appearing.     Comments: Speaking in full sentences, no distress  HENT:     Head: Normocephalic and atraumatic.     Right Ear: Tympanic membrane normal.     Left Ear: Tympanic membrane normal.     Nose: Nose normal. No congestion or rhinorrhea.     Mouth/Throat:     Mouth: Mucous membranes are moist.     Pharynx: No oropharyngeal exudate.  Eyes:     Conjunctiva/sclera: Conjunctivae normal.     Pupils: Pupils are equal, round, and reactive to light.  Neck:     Musculoskeletal: Normal range of motion and neck supple.     Comments: No meningismus. Cardiovascular:     Rate and Rhythm: Normal rate and regular rhythm.     Heart sounds: Murmur present.  Pulmonary:     Effort: Pulmonary effort is normal. No respiratory distress.     Breath sounds: Normal breath sounds.  Abdominal:     Palpations: Abdomen is soft.     Tenderness: There is no abdominal tenderness. There is no guarding or rebound.  Musculoskeletal: Normal range of motion.        General: No tenderness.     Comments: LUE fistula with thrill  Skin:    General: Skin is warm.     Capillary Refill: Capillary refill takes less than 2 seconds.  Neurological:     General: No focal deficit present.     Mental Status: He is alert and oriented to person, place, and time. Mental status is at baseline.     Cranial Nerves: No cranial nerve deficit.     Motor: No abnormal muscle tone.     Coordination: Coordination normal.     Comments: No ataxia on finger to nose bilaterally. No pronator drift. 5/5 strength throughout. CN 2-12 intact.Equal grip strength. Sensation intact.   Psychiatric:        Behavior: Behavior normal.      ED Treatments / Results  Labs (all labs ordered are listed, but only abnormal results are displayed) Labs Reviewed  BASIC METABOLIC PANEL -  Abnormal; Notable for the following components:      Result Value   Sodium 133 (*)    Chloride 90  (*)    Glucose, Bld 297 (*)    BUN 26 (*)    Creatinine, Ser 6.35 (*)    GFR calc non Af Amer 8 (*)    GFR calc Af Amer 10 (*)    All other components within normal limits  CBC WITH DIFFERENTIAL/PLATELET - Abnormal; Notable for the following components:   RBC 3.30 (*)    Hemoglobin 10.0 (*)    HCT 31.5 (*)    All other components within normal limits  TROPONIN I    EKG EKG Interpretation  Date/Time:  Friday July 28 2018 23:59:52 EST Ventricular Rate:  81 PR Interval:    QRS Duration: 177 QT Interval:  445 QTC Calculation: 517 R Axis:   32 Text Interpretation:  Sinus rhythm Borderline prolonged PR interval Right bundle branch block Baseline wander in lead(s) V3 No significant change was found Confirmed by Ezequiel Essex [phone]) on 07/29/2018 12:04:13 AM   Radiology Dg Chest 2 View  Result Date: 07/28/2018 CLINICAL DATA:  Coffee and runny nose for 1 week EXAM: CHEST - 2 VIEW COMPARISON:  June 14, 2016 FINDINGS: The heart size and mediastinal contours are stable. Both lungs are clear. The visualized skeletal structures are unremarkable. IMPRESSION: No active cardiopulmonary disease. Electronically Signed   By: Abelardo Diesel M.D.   On: 07/28/2018 23:49    Procedures Procedures (including critical care time)  Medications Ordered in ED Medications - No data to display   Initial Impression / Assessment and Plan / ED Course  I have reviewed the triage vital signs and the nursing notes.  Pertinent labs & imaging results that were available during my care of the patient were reviewed by me and considered in my medical decision making (see chart for details).    1 week of nonproductive cough, congestion and rhinorrhea.  No chest pain.  No wheezing on exam.  Lungs are clear.  Chest x-ray is negative.  No evidence of volume overload or pneumonia.  Labs are reassuring with normal potassium.  EKG is unchanged and labs are reassuring with negative troponin.  Low suspicion  for ACS.  Patient ambulatory without desaturation.  We will treat empirically for bronchitis with antibiotics and bronchodilators.  Follow-up with PCP.  Return precautions discussed.  Final Clinical Impressions(s) / ED Diagnoses   Final diagnoses:  Bronchitis    ED Discharge Orders    None       Rinaldo Macqueen, Annie Main, MD 07/29/18 [phone]

## 2018-07-28 NOTE — ED Triage Notes (Signed)
Nonproductive cough and runny nose for a week

## 2018-07-29 LAB — BASIC METABOLIC PANEL
Anion gap: 12 (ref 5–15)
BUN: 26 mg/dL — ABNORMAL HIGH (ref 8–23)
CO2: 31 mmol/L (ref 22–32)
Calcium: 9 mg/dL (ref 8.9–10.3)
Chloride: 90 mmol/L — ABNORMAL LOW (ref 98–111)
Creatinine, Ser: 6.35 mg/dL — ABNORMAL HIGH (ref 0.61–1.24)
GFR calc Af Amer: 10 mL/min — ABNORMAL LOW (ref >60)
GFR calc non Af Amer: 8 mL/min — ABNORMAL LOW (ref >60)
Glucose, Bld: 297 mg/dL — ABNORMAL HIGH (ref 70–99)
Potassium: 3.9 mmol/L (ref 3.5–5.1)
Sodium: 133 mmol/L — ABNORMAL LOW (ref 135–145)

## 2018-07-29 LAB — CBC WITH DIFFERENTIAL/PLATELET
Abs Immature Granulocytes: 0.03 10*3/uL (ref 0.00–0.07)
Basophils Absolute: 0 10*3/uL (ref 0.0–0.1)
Basophils Relative: 1 %
Eosinophils Absolute: 0.2 10*3/uL (ref 0.0–0.5)
Eosinophils Relative: 3 %
HCT: 31.5 % — ABNORMAL LOW (ref 39.0–52.0)
Hemoglobin: 10 g/dL — ABNORMAL LOW (ref 13.0–17.0)
Immature Granulocytes: 0 %
Lymphocytes Relative: 10 %
Lymphs Abs: 0.8 10*3/uL (ref 0.7–4.0)
MCH: 30.3 pg (ref 26.0–34.0)
MCHC: 31.7 g/dL (ref 30.0–36.0)
MCV: 95.5 fL (ref 80.0–100.0)
Monocytes Absolute: 0.6 10*3/uL (ref 0.1–1.0)
Monocytes Relative: 9 %
Neutro Abs: 5.5 10*3/uL (ref 1.7–7.7)
Neutrophils Relative %: 77 %
Platelets: 200 10*3/uL (ref 150–400)
RBC: 3.3 MIL/uL — ABNORMAL LOW (ref 4.22–5.81)
RDW: 14.5 % (ref 11.5–15.5)
WBC: 7.2 10*3/uL (ref 4.0–10.5)
nRBC: 0 % (ref 0.0–0.2)

## 2018-07-29 LAB — TROPONIN I: Troponin I: <0.03 ng/mL (ref <0.03)

## 2018-07-29 MED ORDER — DOXYCYCLINE HYCLATE 100 MG PO CAPS: 100.0000 mg | ORAL_CAPSULE | Freq: Two times a day (BID) | ORAL | 0 refills | Status: DC

## 2018-07-29 MED ORDER — ALBUTEROL SULFATE HFA 108 (90 BASE) MCG/ACT IN AERS: 2.0000 | INHALATION_SPRAY | Freq: Four times a day (QID) | RESPIRATORY_TRACT | 0 refills | Status: DC | PRN

## 2018-07-29 NOTE — ED Notes (Signed)
98% while ambulating HR 84. Denies  SOB

## 2018-07-29 NOTE — Discharge Instructions (Addendum)
Take the antibiotics as prescribed and use the inhaler as needed for difficulty breathing and coughing.  Follow-up with your doctor.  Return to the ED with chest pain, shortness of breath, any other concerns.

## 2019-09-27 ENCOUNTER — Encounter: Payer: Self-pay | Admitting: Internal Medicine

## 2020-05-27 ENCOUNTER — Ambulatory Visit: Payer: Medicare Other | Admitting: Orthopaedic Surgery

## 2020-11-16 ENCOUNTER — Encounter (HOSPITAL_COMMUNITY): Payer: Self-pay | Admitting: *Deleted

## 2020-11-16 ENCOUNTER — Emergency Department (HOSPITAL_COMMUNITY): Payer: Medicare Other

## 2020-11-16 ENCOUNTER — Other Ambulatory Visit: Payer: Self-pay

## 2020-11-16 ENCOUNTER — Observation Stay (HOSPITAL_COMMUNITY)
Admission: EM | Admit: 2020-11-16 | Discharge: 2020-11-17 | Disposition: A | Discharge status: Home / Self Care | Payer: Medicare Other | Attending: Internal Medicine | Admitting: Internal Medicine

## 2020-11-16 DIAGNOSIS — E871 Hypo-osmolality and hyponatremia: Secondary | ICD-10-CM | POA: Diagnosis not present

## 2020-11-16 DIAGNOSIS — I5031 Acute diastolic (congestive) heart failure: Secondary | ICD-10-CM | POA: Insufficient documentation

## 2020-11-16 DIAGNOSIS — E1121 Type 2 diabetes mellitus with diabetic nephropathy: Secondary | ICD-10-CM | POA: Diagnosis present

## 2020-11-16 DIAGNOSIS — Z20822 Contact with and (suspected) exposure to covid-19: Secondary | ICD-10-CM | POA: Diagnosis not present

## 2020-11-16 DIAGNOSIS — R2681 Unsteadiness on feet: Secondary | ICD-10-CM

## 2020-11-16 DIAGNOSIS — Z87891 Personal history of nicotine dependence: Secondary | ICD-10-CM | POA: Diagnosis not present

## 2020-11-16 DIAGNOSIS — I2510 Atherosclerotic heart disease of native coronary artery without angina pectoris: Secondary | ICD-10-CM | POA: Diagnosis not present

## 2020-11-16 DIAGNOSIS — E785 Hyperlipidemia, unspecified: Secondary | ICD-10-CM | POA: Diagnosis present

## 2020-11-16 DIAGNOSIS — E1122 Type 2 diabetes mellitus with diabetic chronic kidney disease: Secondary | ICD-10-CM | POA: Diagnosis not present

## 2020-11-16 DIAGNOSIS — I132 Hypertensive heart and chronic kidney disease with heart failure and with stage 5 chronic kidney disease, or end stage renal disease: Secondary | ICD-10-CM | POA: Diagnosis not present

## 2020-11-16 DIAGNOSIS — D631 Anemia in chronic kidney disease: Secondary | ICD-10-CM | POA: Diagnosis present

## 2020-11-16 DIAGNOSIS — Z992 Dependence on renal dialysis: Secondary | ICD-10-CM | POA: Diagnosis not present

## 2020-11-16 DIAGNOSIS — E119 Type 2 diabetes mellitus without complications: Secondary | ICD-10-CM | POA: Diagnosis present

## 2020-11-16 DIAGNOSIS — D638 Anemia in other chronic diseases classified elsewhere: Secondary | ICD-10-CM | POA: Diagnosis not present

## 2020-11-16 DIAGNOSIS — Z7982 Long term (current) use of aspirin: Secondary | ICD-10-CM | POA: Insufficient documentation

## 2020-11-16 DIAGNOSIS — Z79899 Other long term (current) drug therapy: Secondary | ICD-10-CM | POA: Diagnosis not present

## 2020-11-16 DIAGNOSIS — Z794 Long term (current) use of insulin: Secondary | ICD-10-CM | POA: Diagnosis not present

## 2020-11-16 DIAGNOSIS — N186 End stage renal disease: Secondary | ICD-10-CM

## 2020-11-16 DIAGNOSIS — I10 Essential (primary) hypertension: Secondary | ICD-10-CM | POA: Diagnosis present

## 2020-11-16 DIAGNOSIS — N189 Chronic kidney disease, unspecified: Secondary | ICD-10-CM | POA: Diagnosis present

## 2020-11-16 DIAGNOSIS — R270 Ataxia, unspecified: Principal | ICD-10-CM

## 2020-11-16 DIAGNOSIS — Z951 Presence of aortocoronary bypass graft: Secondary | ICD-10-CM | POA: Diagnosis not present

## 2020-11-16 HISTORY — DX: Dependence on renal dialysis: Z99.2

## 2020-11-16 LAB — CBC WITH DIFFERENTIAL/PLATELET
Abs Immature Granulocytes: 0.06 10*3/uL (ref 0.00–0.07)
Basophils Absolute: 0 10*3/uL (ref 0.0–0.1)
Basophils Relative: 0 %
Eosinophils Absolute: 0.3 10*3/uL (ref 0.0–0.5)
Eosinophils Relative: 3 %
HCT: 31.2 % — ABNORMAL LOW (ref 39.0–52.0)
Hemoglobin: 10.6 g/dL — ABNORMAL LOW (ref 13.0–17.0)
Immature Granulocytes: 1 %
Lymphocytes Relative: 5 %
Lymphs Abs: 0.5 10*3/uL — ABNORMAL LOW (ref 0.7–4.0)
MCH: 33.4 pg (ref 26.0–34.0)
MCHC: 34 g/dL (ref 30.0–36.0)
MCV: 98.4 fL (ref 80.0–100.0)
Monocytes Absolute: 1.2 10*3/uL — ABNORMAL HIGH (ref 0.1–1.0)
Monocytes Relative: 11 %
Neutro Abs: 8.2 10*3/uL — ABNORMAL HIGH (ref 1.7–7.7)
Neutrophils Relative %: 80 %
Platelets: 243 10*3/uL (ref 150–400)
RBC: 3.17 MIL/uL — ABNORMAL LOW (ref 4.22–5.81)
RDW: 12.9 % (ref 11.5–15.5)
WBC: 10.3 10*3/uL (ref 4.0–10.5)
nRBC: 0 % (ref 0.0–0.2)

## 2020-11-16 LAB — BASIC METABOLIC PANEL
Anion gap: 12 (ref 5–15)
BUN: 65 mg/dL — ABNORMAL HIGH (ref 8–23)
CO2: 30 mmol/L (ref 22–32)
Calcium: 9.1 mg/dL (ref 8.9–10.3)
Chloride: 84 mmol/L — ABNORMAL LOW (ref 98–111)
Creatinine, Ser: 9.74 mg/dL — ABNORMAL HIGH (ref 0.61–1.24)
GFR, Estimated: 5 mL/min — ABNORMAL LOW (ref >60)
Glucose, Bld: 170 mg/dL — ABNORMAL HIGH (ref 70–99)
Potassium: 3.6 mmol/L (ref 3.5–5.1)
Sodium: 126 mmol/L — ABNORMAL LOW (ref 135–145)

## 2020-11-16 LAB — GLUCOSE, CAPILLARY
Glucose-Capillary: 298 mg/dL — ABNORMAL HIGH (ref 70–99)
Glucose-Capillary: 266 mg/dL — ABNORMAL HIGH (ref 70–99)

## 2020-11-16 LAB — RESP PANEL BY RT-PCR (FLU A&B, COVID) ARPGX2
Influenza A by PCR: NEGATIVE
Influenza B by PCR: NEGATIVE
SARS Coronavirus 2 by RT PCR: NEGATIVE

## 2020-11-16 MED ORDER — INSULIN GLARGINE 100 UNIT/ML ~~LOC~~ SOLN
10.0000 [IU] | Freq: Every day | SUBCUTANEOUS | Status: DC
  Administered 2020-11-16: 10 [IU] via SUBCUTANEOUS
  Filled 2020-11-16 (×2): qty 0.1

## 2020-11-16 MED ORDER — ACETAMINOPHEN 650 MG RE SUPP: 650.0000 mg | Freq: Four times a day (QID) | RECTAL | Status: DC | PRN

## 2020-11-16 MED ORDER — ZOLPIDEM TARTRATE 5 MG PO TABS
5.0000 mg | ORAL_TABLET | Freq: Once | ORAL | Status: AC
  Administered 2020-11-16: 5 mg via ORAL
  Filled 2020-11-16: qty 1

## 2020-11-16 MED ORDER — ACETAMINOPHEN 325 MG PO TABS: 650.0000 mg | ORAL_TABLET | Freq: Four times a day (QID) | ORAL | Status: DC | PRN

## 2020-11-16 MED ORDER — SODIUM CHLORIDE 1 G PO TABS
1.0000 g | ORAL_TABLET | Freq: Once | ORAL | Status: AC
  Administered 2020-11-16: 1 g via ORAL
  Filled 2020-11-16 (×2): qty 1

## 2020-11-16 MED ORDER — CARVEDILOL 12.5 MG PO TABS
25.0000 mg | ORAL_TABLET | Freq: Two times a day (BID) | ORAL | Status: DC
  Administered 2020-11-17: 25 mg via ORAL
  Filled 2020-11-16 (×2): qty 2

## 2020-11-16 MED ORDER — ONDANSETRON HCL 4 MG/2ML IJ SOLN: 4.0000 mg | Freq: Four times a day (QID) | INTRAMUSCULAR | Status: DC | PRN

## 2020-11-16 MED ORDER — CLONIDINE HCL 0.2 MG PO TABS
0.2000 mg | ORAL_TABLET | Freq: Two times a day (BID) | ORAL | Status: DC
  Administered 2020-11-17: 0.2 mg via ORAL
  Filled 2020-11-16 (×2): qty 1

## 2020-11-16 MED ORDER — ONDANSETRON HCL 4 MG PO TABS: 4.0000 mg | ORAL_TABLET | Freq: Four times a day (QID) | ORAL | Status: DC | PRN

## 2020-11-16 MED ORDER — INSULIN ASPART 100 UNIT/ML IJ SOLN
0.0000 [IU] | Freq: Three times a day (TID) | INTRAMUSCULAR | Status: DC
  Administered 2020-11-17 (×2): 5 [IU] via SUBCUTANEOUS

## 2020-11-16 MED ORDER — ASPIRIN EC 325 MG PO TBEC
325.0000 mg | DELAYED_RELEASE_TABLET | ORAL | Status: DC
  Administered 2020-11-16: 325 mg via ORAL
  Filled 2020-11-16: qty 1

## 2020-11-16 MED ORDER — ENOXAPARIN SODIUM 30 MG/0.3ML IJ SOSY
30.0000 mg | PREFILLED_SYRINGE | INTRAMUSCULAR | Status: DC
  Administered 2020-11-16 – 2020-11-17 (×2): 30 mg via SUBCUTANEOUS
  Filled 2020-11-16 (×2): qty 0.3

## 2020-11-16 MED ORDER — CLONIDINE HCL 0.1 MG PO TABS: 0.1000 mg | ORAL_TABLET | Freq: Two times a day (BID) | ORAL | Status: DC

## 2020-11-16 MED ORDER — INSULIN ASPART 100 UNIT/ML IJ SOLN
0.0000 [IU] | Freq: Every day | INTRAMUSCULAR | Status: DC
  Administered 2020-11-16: 3 [IU] via SUBCUTANEOUS

## 2020-11-16 MED ORDER — LATANOPROST 0.005 % OP SOLN
2.0000 [drp] | Freq: Two times a day (BID) | OPHTHALMIC | Status: DC
  Administered 2020-11-16 – 2020-11-17 (×2): 2 [drp] via OPHTHALMIC
  Filled 2020-11-16: qty 2.5

## 2020-11-16 MED ORDER — MINOXIDIL 2.5 MG PO TABS
5.0000 mg | ORAL_TABLET | Freq: Every day | ORAL | Status: DC
  Filled 2020-11-16 (×3): qty 2

## 2020-11-16 NOTE — ED Triage Notes (Signed)
Pt c/o loss of balance and stiff neck x 3 weeks. He reports he feels as if he's staggering. Pt reports at times he hears clicking sounds in both ears. Denies weakness, numbness.

## 2020-11-16 NOTE — Progress Notes (Signed)
Add on to previous note:   Patient took:  Minoxidil 10 mg tablet, but only took half so 5 mg. Clonidine 0.2 mg 1 tablet  Did not take the carvedilol stated he did not need it because he took the two other medications.    Patient refused medications [place] to pharmacy. Michela Pitcher," His wife would be here tomorrow to take them home." Advised patient to not take anymore of his home medications. Patient agreed he wouldn't take anything else.   Will continue to monitor.

## 2020-11-16 NOTE — H&P (Signed)
History and Physical    [name] BZJ:[id number] DOB: 1950/07/02 DOA: 11/16/2020  PCP: [facility]  Patient coming from: Home  I have personally briefly reviewed patient's old medical records in [place]  Chief Complaint: Dizziness  HPI: [name] is a 70 y.o. male with medical history significant of hypertension, diabetes, coronary artery disease status post CABG, end-stage renal disease on hemodialysis, presents to the hospital with complaints of dizziness, ataxia and difficulty walking.  Patient denies any lightheadedness or feelings of passing out.  He has not had any chest pain, shortness of breath, palpitations.  He reports having these symptoms of ataxia and difficulty walking (feeling as though he will fall over) for a few weeks now.  He does not note leaning towards any specific side.  Denies any unilateral weakness or numbness.  He has not had any recent changes in vision.  Reports that his symptoms did get worse on Friday after dialysis when he had noticed that his blood sugar was over 400 and blood pressure was over 200.  He reports that he had taken his medications that day.  Reports that his symptoms did mildly improve after his blood pressure and blood sugar  improved by the following day.  He has not had any fever, although he did report some chills last week.  He has not had any cough, but feels as though he did have some allergies and was taking some allergy medication last week.  He had 1 episode of vomiting last Friday when his sugar was elevated, but none since then.  His bowel movements have been normal.  He has not been started on any other new medications recently.  ED Course: In the emergency room, vitals were noted to be stable.  Labs were relatively unrevealing.  His gait was checked and noted to be mild to moderately unsteady.  It was noted that he had to hold onto things at times so he would not lose his balance.  He was also  noted that his finger-to-nose test was equivocal bilaterally.  He was noted to be mildly hyponatremic.  The patient has been referred for admission for further work-up  Review of Systems:  Review of Systems  Constitutional: Positive for chills. Negative for fever.  HENT: Positive for congestion. Negative for sore throat.   Eyes: Negative for blurred vision and double vision.  Respiratory: Negative for cough and shortness of breath.   Cardiovascular: Negative for chest pain, palpitations, orthopnea and leg swelling.  Gastrointestinal: Positive for vomiting. Negative for abdominal pain, diarrhea and nausea.  Genitourinary: Negative for dysuria.  Musculoskeletal: Positive for neck pain. Negative for back pain.  Neurological: Positive for dizziness. Negative for sensory change, focal weakness, loss of consciousness and headaches.       Past Medical History:  Diagnosis Date  . Arthritis   . CAD (coronary artery disease)    STEMI with LAD stent by [facility] in 2005  . Dependence on renal dialysis ([place])   . Depression   . Diabetes mellitus without complication ([place])   . History of kidney stones   . Hypertension   . Kidney infection   . MI (mitral incompetence)   . Myocardial infarction ([place])   . Seasonal allergies     Past Surgical History:  Procedure Laterality Date  . AV FISTULA PLACEMENT Left 01/30/2014   Procedure: INSERTION OF ARTERIOVENOUS (AV) GORE-TEX GRAFT LEFT UPPER ARM;  Surgeon: Elam Dutch, MD;  Location: [facility];  Service: Vascular;  Laterality: Left;  . BACK SURGERY    . CARDIAC CATHETERIZATION N/A 04/01/2015   Procedure: Right/Left Heart Cath and Coronary Angiography;  Surgeon: Leonie Man, MD;  Location: [facility];  Service: Cardiovascular;  Laterality: N/A;  . COLONOSCOPY N/A 10/12/2016   Procedure: COLONOSCOPY;  Surgeon: Daneil Dolin, MD;  [facility];  Service: Endoscopy;  Laterality: N/A;  2:00pm  . COLONOSCOPY W/ POLYPECTOMY    .  CORONARY ANGIOPLASTY WITH STENT PLACEMENT  2008  . CORONARY ARTERY BYPASS GRAFT N/A 04/03/2015   Procedure: CORONARY ARTERY BYPASS GRAFTING (CABG) x 4 (LIMA to LAD, SVG to DIAGONAL, SVG to OM1, and SVG to RCA) with EVH from right greater saphenous thigh and partial lower leg vein ;  Surgeon: Ivin Poot, MD;  Location: [facility];  Service: Open Heart Surgery;  Laterality: N/A;  . EYE SURGERY Right    lens implant  . INSERTION OF DIALYSIS CATHETER Right 01/30/2014   Procedure: INSERTION OF DIALYSIS CATHETER-RIGHT INTERNAL JUGULAR;  Surgeon: Elam Dutch, MD;  Location: [facility];  Service: Vascular;  Laterality: Right;  . POLYPECTOMY  10/12/2016   Procedure: POLYPECTOMY;  Surgeon: Daneil Dolin, MD;  [facility];  Service: Endoscopy;;  colon  . SPINE SURGERY    . TEE WITHOUT CARDIOVERSION N/A 04/03/2015   Procedure: TRANSESOPHAGEAL ECHOCARDIOGRAM (TEE);  Surgeon: Ivin Poot, MD;  Location: [facility];  Service: Open Heart Surgery;  Laterality: N/A;    Social History:  reports that he has never smoked. He quit smokeless tobacco use about 10 years ago.  His smokeless tobacco use included chew. He reports that he does not drink alcohol and does not use drugs.  Allergies  Allergen Reactions  . Shellfish Allergy Hives    Family History  Problem Relation Age of Onset  . Diabetes Mother   . Hypertension Mother   . Diabetes Father   . Hypertension Father   . Diabetes Other     Prior to Admission medications   Medication Sig Start Date End Date Taking? Authorizing Provider  acetaminophen (TYLENOL) 500 MG tablet Take 500 mg by mouth every 6 (six) hours as needed for mild pain or moderate pain.   Yes [provider]  aspirin EC 325 MG tablet Take 325mg s once daily on non dialysis days. Dialysis days are Mon, Wed, Fri   Yes [provider]  carvedilol (COREG) 25 MG tablet Take 25 mg by mouth 2 (two) times daily.  02/27/15  Yes [provider]  cloNIDine  (CATAPRES) 0.1 MG tablet Take 0.1 mg by mouth See admin instructions. Patients states he does not take as prescribed, sometimes takes 2-3 times daily 11/07/13  Yes [provider]  diphenhydramine-acetaminophen (TYLENOL PM) 25-500 MG TABS tablet Take 1 tablet by mouth at bedtime as needed (sleep).   Yes [provider]  insulin glargine (LANTUS) 100 UNIT/ML injection Inject 10-20 Units into the skin at bedtime.   Yes [provider]  latanoprost (XALATAN) 0.005 % ophthalmic solution Place 2 drops into both eyes 2 (two) times daily. 08/27/16  Yes [provider]  magnesium hydroxide (MILK OF MAGNESIA) 400 MG/5ML suspension Take 15 mLs by mouth daily as needed for mild constipation.   Yes [provider]  minoxidil (LONITEN) 10 MG tablet Take 10 mg by mouth at bedtime. Patient does not take as prescribed. Sometimes takes a 1/2 tablet depending on BP readings 10/21/20  Yes [provider]  Multiple Vitamin (DAILY VITAMIN PO) Take 1 tablet by mouth daily as needed (supplement).   Yes [provider]  NITROSTAT 0.4 MG SL tablet Place 0.4 mg under the tongue every 5 (five) minutes as needed for chest pain.  01/23/15  Yes [provider]  Omega-3 Fatty Acids (FISH OIL) 1000 MG CAPS Take 1,000 mg by mouth daily.   Yes [provider]  zolpidem (AMBIEN) 10 MG tablet Take 10 mg by mouth at bedtime as needed for sleep.  05/29/16  Yes [provider]  albuterol (PROVENTIL HFA;VENTOLIN HFA) 108 (90 Base) MCG/ACT inhaler Inhale 2 puffs into the lungs every 6 (six) hours as needed. Patient not taking: Reported on 11/16/2020 07/29/18   Ezequiel Essex, MD  amLODipine (NORVASC) 10 MG tablet Take 10 mg by mouth daily.  Patient not taking: Reported on 11/16/2020 01/13/14   [provider]  atorvastatin (LIPITOR) 80 MG tablet Take 1 tablet (80 mg total) by mouth daily at 6 PM. Patient not taking: No sig reported 04/10/15   Nani Skillern, PA-C  doxycycline (VIBRAMYCIN) 100 MG capsule Take 1 capsule (100 mg total) by mouth 2 (two) times daily. Patient not taking: No sig reported 07/29/18   Rancour, Annie Main, MD  polyethylene glycol-electrolytes (TRILYTE) 420 g solution Take 4,000 mLs by mouth as directed. Patient not taking: Reported on 11/16/2020 09/14/16   Daneil Dolin, MD    Physical Exam: Vitals:   11/16/20 1044 11/16/20 1049 11/16/20 1230 11/16/20 1402  BP:  127/61 137/60 (!) 163/73  Pulse:  82 80 77  Resp:  16 18 16   Temp:  99 F (37.2 C)  98 F (36.7 C)  TempSrc:  Oral  Oral  SpO2:  99% 98% 100%  Weight: 81.6 kg     Height: 6\' 5"  (1.956 m)       Constitutional: NAD, calm, comfortable Eyes: PERRL, lids and conjunctivae normal ENMT: Mucous membranes are moist. Posterior pharynx clear of any exudate or lesions.Normal dentition.  Neck: normal, supple, no masses, no thyromegaly Respiratory: clear to auscultation bilaterally, no wheezing, no crackles. Normal respiratory effort. No accessory muscle use.  Cardiovascular: Regular rate and rhythm, no murmurs / rubs / gallops. No extremity edema. 2+ pedal pulses. No carotid bruits.  Abdomen: no tenderness, no masses palpated. No hepatosplenomegaly. Bowel sounds positive.  Musculoskeletal: no clubbing / cyanosis. No joint deformity upper and lower extremities. Good ROM, no contractures. Normal muscle tone.  Skin: no rashes, lesions, ulcers. No induration Neurologic: CN 2-12 grossly intact. Sensation intact, DTR normal. Strength 5/5 in all 4.  Gait was not checked since patient felt unsteady Psychiatric: Normal judgment and insight. Alert and oriented x 3. Normal mood.    Labs on Admission: I have personally reviewed following labs and imaging studies  CBC: Recent Labs  Lab 11/16/20 1056  WBC 10.3  NEUTROABS 8.2*  HGB 10.6*  HCT 31.2*  MCV 98.4  PLT 026   Basic Metabolic Panel: Recent Labs  Lab 11/16/20 1056  NA 126*  K 3.6  CL 84*  CO2 30   GLUCOSE 170*  BUN 65*  CREATININE 9.74*  CALCIUM 9.1   GFR: Estimated Creatinine Clearance: 8.3 mL/min (A) (by C-G formula based on SCr of 9.74 mg/dL (H)). Liver Function Tests: No results for input(s): AST, ALT, ALKPHOS, BILITOT, PROT, ALBUMIN in the last 168 hours. No results for input(s): LIPASE, AMYLASE in the last 168 hours. No results for input(s): AMMONIA in the last 168 hours. Coagulation Profile:  No results for input(s): INR, PROTIME in the last 168 hours. Cardiac Enzymes: No results for input(s): CKTOTAL, CKMB, CKMBINDEX, TROPONINI in the last 168 hours. BNP (last 3 results) No results for input(s): PROBNP in the last 8760 hours. HbA1C: No results for input(s): HGBA1C in the last 72 hours. CBG: No results for input(s): GLUCAP in the last 168 hours. Lipid Profile: No results for input(s): CHOL, HDL, LDLCALC, TRIG, CHOLHDL, LDLDIRECT in the last 72 hours. Thyroid Function Tests: No results for input(s): TSH, T4TOTAL, FREET4, T3FREE, THYROIDAB in the last 72 hours. Anemia Panel: No results for input(s): VITAMINB12, FOLATE, FERRITIN, TIBC, IRON, RETICCTPCT in the last 72 hours. Urine analysis:    Component Value Date/Time   COLORURINE YELLOW 04/03/2015 0306   APPEARANCEUR TURBID (A) 04/03/2015 0306   LABSPEC 1.021 04/03/2015 0306   PHURINE 6.5 04/03/2015 0306   GLUCOSEU 250 (A) 04/03/2015 0306   HGBUR MODERATE (A) 04/03/2015 0306   BILIRUBINUR NEGATIVE 04/03/2015 0306   KETONESUR NEGATIVE 04/03/2015 0306   PROTEINUR >300 (A) 04/03/2015 0306   UROBILINOGEN 0.2 04/03/2015 0306   NITRITE NEGATIVE 04/03/2015 0306   LEUKOCYTESUR LARGE (A) 04/03/2015 0306    Radiological Exams on Admission: CT Head Wo Contrast  Result Date: 11/16/2020 CLINICAL DATA:  Loss of balance in stiff neck for 3 weeks. EXAM: CT HEAD WITHOUT CONTRAST TECHNIQUE: Contiguous axial images were obtained from the base of the skull through the vertex without intravenous contrast. COMPARISON:  CT head  dated 06/14/2016. FINDINGS: Brain: No evidence of acute infarction, hemorrhage, hydrocephalus, extra-axial collection or mass lesion/mass effect. There is mild cerebral volume loss with associated ex vacuo dilatation. Periventricular white matter hypoattenuation likely represents chronic small vessel ischemic disease. Vascular: There are vascular calcifications in the carotid siphons. Skull: Normal. Negative for fracture or focal lesion. Sinuses/Orbits: There is mild right ethmoid sinus disease. Other: None. IMPRESSION: No acute intracranial process. Electronically Signed   By: Zerita Boers M.D.   On: 11/16/2020 12:10    EKG: Independently reviewed. Sinus rhythm with RBBB, prolonged PR interval  Assessment/Plan Active Problems:   Type 2 diabetes mellitus with diabetic nephropathy (HCC)   HLD (hyperlipidemia)   Essential hypertension   Anemia of chronic disease   Hyponatremia   ESRD on dialysis (HCC)   CAD (coronary artery disease)   Ataxia    Ataxia -Etiology is not entirely clear -There was some concern about his coordination in the emergency room and concerns for posterior circulation stroke -Does not appear to have significant deficits upon my evaluation -Although with his vascular risk factors including hypertension, diabetes, end-stage renal disease and coronary artery disease, I think it would be reasonable to pursue MRI brain to rule out posterior circulation CVA -We will also check orthostatics -Check TSH and B12 -Hold home medications including Ambien and Benadryl -We will get physical therapy evaluation  Hyponatremia -Suspect this is related to free water intake and a dialysis patient -I am doubtful this is contributing to ataxia and is likely not clinically significant -This should correct with hemodialysis  End-stage renal disease -Patient received dialysis Monday, Wednesday, Friday at [facility] dialysis center in [place] -Reports last treatment was on Friday and  was a full treatment -He has not missed any recent treatments -Nephrology has been consulted so that he can receive his routine dialysis tomorrow  Anemia of chronic disease -Related to kidney disease -Continue to follow hemoglobin  Diabetes -Patient does take Lantus at home, but appears to take it on sliding scale -At this point,  we will continue on Lantus 10 units daily and supplement with sliding scale NovoLog  Hypertension -Patient takes Coreg, minoxidil and clonidine -Appears to have variation in how he takes it based on his blood pressure -He was advised that it may be safer to take his blood pressure medications as prescribed so as to avoid significant swings in his blood pressure -He will need to discuss this further with his primary care physician -We will continue medications for now  Coronary artery disease status post CABG -No complaints of chest pain at present -EKG appears to be nonacute -Continue on aspirin -Patient appears to have discontinued his statin on his own  DVT prophylaxis: lovenox  Code Status: full code  Family Communication: offered to call patient's wife for update, but patient asked that I not call since he wants her to rest tonight and will update her tomorrow  Disposition Plan: discharge home tomorrow after work up complete  Consults called: nephrology  Admission status: observation, telemetry   Kathie Dike MD [facility]   If 7PM-7AM, please contact night-coverage [email or web address]   11/16/2020, 5:11 PM

## 2020-11-16 NOTE — Progress Notes (Signed)
Patient BP 205/89. Went to give patient his blood pressure medications and he informed me that 15 minutes before I came into the room that he had taken his own blood pressure medications from home that he had with him. Notified on call MD. No scheduled BP meds given at this time. Will continue to monitor.

## 2020-11-16 NOTE — ED Provider Notes (Signed)
[facility] EMERGENCY DEPARTMENT Provider Note   CSN: [id number] Arrival date & time: 11/16/20  1023     History Chief Complaint  Patient presents with  . Loss of Balance    [name] is a 70 y.o. male.  HPI     Past Medical History:  Diagnosis Date  . Arthritis   . CAD (coronary artery disease)    STEMI with LAD stent by C Granger in 2005  . Dependence on renal dialysis ([place])   . Depression   . Diabetes mellitus without complication ([place])   . History of kidney stones   . Hypertension   . Kidney infection   . MI (mitral incompetence)   . Myocardial infarction ([place])   . Seasonal allergies     Patient Active Problem List   Diagnosis Date Noted  . History of adenomatous polyp of colon 09/14/2016  . CAD (coronary artery disease)   . Secondary hypertension, unspecified   . Acute diastolic heart failure ([place])   . SOB (shortness of breath) 03/31/2015  . Volume overload 03/31/2015  . NSTEMI (non-ST elevated myocardial infarction) ([place]) 03/31/2015  . ESRD on dialysis ([place])   . Hypoxia 03/21/2015  . Fluid overload 03/21/2015  . End stage renal disease ([place]) 03/07/2014  . Chronic kidney disease 01/29/2014  . Acute on chronic renal failure ([place]) 01/14/2014  . Anemia of chronic disease 01/14/2014  . Hyponatremia 01/14/2014  . Hyperkalemia 01/14/2014  . Type 2 diabetes mellitus with diabetic nephropathy ([place]) 09/02/2009  . HYPERLIPIDEMIA 09/02/2009  . Essential hypertension 09/02/2009  . Coronary atherosclerosis 09/02/2009    Past Surgical History:  Procedure Laterality Date  . AV FISTULA PLACEMENT Left 01/30/2014   Procedure: INSERTION OF ARTERIOVENOUS (AV) GORE-TEX GRAFT LEFT UPPER ARM;  Surgeon: Elam Dutch, MD;  Location: [place];  Service: Vascular;  Laterality: Left;  . BACK SURGERY    . CARDIAC CATHETERIZATION N/A 04/01/2015   Procedure: Right/Left Heart Cath and Coronary Angiography;  Surgeon: Leonie Man, MD;  Location: [place] CV LAB;   Service: Cardiovascular;  Laterality: N/A;  . COLONOSCOPY N/A 10/12/2016   Procedure: COLONOSCOPY;  Surgeon: Daneil Dolin, MD;  [facility];  Service: Endoscopy;  Laterality: N/A;  2:00pm  . COLONOSCOPY W/ POLYPECTOMY    . CORONARY ANGIOPLASTY WITH STENT PLACEMENT  2008  . CORONARY ARTERY BYPASS GRAFT N/A 04/03/2015   Procedure: CORONARY ARTERY BYPASS GRAFTING (CABG) x 4 (LIMA to LAD, SVG to DIAGONAL, SVG to OM1, and SVG to RCA) with EVH from right greater saphenous thigh and partial lower leg vein ;  Surgeon: Ivin Poot, MD;  Location: [place];  Service: Open Heart Surgery;  Laterality: N/A;  . EYE SURGERY Right    lens implant  . INSERTION OF DIALYSIS CATHETER Right 01/30/2014   Procedure: INSERTION OF DIALYSIS CATHETER-RIGHT INTERNAL JUGULAR;  Surgeon: Elam Dutch, MD;  Location: [place];  Service: Vascular;  Laterality: Right;  . POLYPECTOMY  10/12/2016   Procedure: POLYPECTOMY;  Surgeon: Daneil Dolin, MD;  [facility];  Service: Endoscopy;;  colon  . SPINE SURGERY    . TEE WITHOUT CARDIOVERSION N/A 04/03/2015   Procedure: TRANSESOPHAGEAL ECHOCARDIOGRAM (TEE);  Surgeon: Ivin Poot, MD;  Location: [place];  Service: Open Heart Surgery;  Laterality: N/A;       Family History  Problem Relation Age of Onset  . Diabetes Mother   . Hypertension Mother   . Diabetes Father   . Hypertension  Father   . Diabetes Other     Social History   Tobacco Use  . Smoking status: Never Smoker  . Smokeless tobacco: Former Systems developer    Types: Secondary school teacher  . Vaping Use: Never used  Substance Use Topics  . Alcohol use: No  . Drug use: No    Home Medications Prior to Admission medications   Medication Sig Start Date End Date Taking? Authorizing Provider  acetaminophen (TYLENOL) 500 MG tablet Take 500 mg by mouth every 6 (six) hours as needed for mild pain or moderate pain.    [provider]  albuterol (PROVENTIL HFA;VENTOLIN HFA) 108 (90 Base) MCG/ACT  inhaler Inhale 2 puffs into the lungs every 6 (six) hours as needed. 07/29/18   Rancour, Annie Main, MD  amLODipine (NORVASC) 10 MG tablet Take 10 mg by mouth daily.  01/13/14   [provider]  aspirin EC 325 MG tablet Take 325mg s once daily on non dialysis days    [provider]  atorvastatin (LIPITOR) 80 MG tablet Take 1 tablet (80 mg total) by mouth daily at 6 PM. 04/10/15   Nani Skillern, PA-C  calcium acetate (PHOSLO) 667 MG capsule Take 667 mg by mouth 3 (three) times daily with meals.    [provider]  carvedilol (COREG) 25 MG tablet Take 25 mg by mouth 2 (two) times daily.  02/27/15   [provider]  cloNIDine (CATAPRES) 0.1 MG tablet Take 0.1 mg by mouth 3 (three) times daily.  11/07/13   [provider]  doxycycline (VIBRAMYCIN) 100 MG capsule Take 1 capsule (100 mg total) by mouth 2 (two) times daily. 07/29/18   Rancour, Annie Main, MD  furosemide (LASIX) 20 MG tablet Take 60 mg by mouth daily. Doesn't take on Dialysis days:Mon, Wed, and Fri    [provider]  insulin glargine (LANTUS) 100 UNIT/ML injection Inject 25 Units into the skin at bedtime.     [provider]  latanoprost (XALATAN) 0.005 % ophthalmic solution Place 1 drop into both eyes at bedtime. 08/27/16   [provider]  magnesium hydroxide (MILK OF MAGNESIA) 400 MG/5ML suspension Take 15 mLs by mouth daily as needed for mild constipation.    [provider]  Multiple Vitamin (DAILY VITAMIN PO) Take 1 tablet by mouth daily.    [provider]  NITROSTAT 0.4 MG SL tablet Place 0.4 mg under the tongue every 5 (five) minutes as needed for chest pain.  01/23/15   [provider]  Omega-3 Fatty Acids (FISH OIL) 1000 MG CAPS Take 1,000 mg by mouth 2 (two) times daily.     [provider]  polyethylene glycol-electrolytes (TRILYTE) 420 g solution Take 4,000 mLs by mouth as directed. 09/14/16   Rourk, Cristopher Estimable, MD  zolpidem  (AMBIEN) 10 MG tablet Take 10 mg by mouth at bedtime as needed for sleep.  05/29/16   [provider]    Allergies    Shellfish allergy  Review of Systems   Review of Systems  Constitutional: Negative for fever.  HENT: Negative for ear pain and sore throat.   Eyes: Negative for pain.  Respiratory: Negative for cough.   Cardiovascular: Negative for chest pain.  Gastrointestinal: Negative for abdominal pain.  Genitourinary: Negative for flank pain.  Musculoskeletal: Negative for back pain.  Skin: Negative for color change and rash.  Neurological: Negative for syncope.  All other systems reviewed and are negative.   Physical Exam Updated Vital Signs BP 137/60  Pulse 80   Temp 99 F (37.2 C) (Oral)   Resp 18   Ht 6\' 5"  (1.956 m)   Wt 81.6 kg   SpO2 98%   BMI 21.34 kg/m   Physical Exam Constitutional:      General: He is not in acute distress.    Appearance: He is well-developed.  HENT:     Head: Normocephalic.     Nose: Nose normal.  Eyes:     Extraocular Movements: Extraocular movements intact.  Neck:     Comments: No carotid bruits noted. Cardiovascular:     Rate and Rhythm: Normal rate.  Pulmonary:     Effort: Pulmonary effort is normal.  Musculoskeletal:     Cervical back: Normal range of motion and neck supple. No rigidity.  Skin:    Coloration: Skin is not jaundiced.     Findings: Erythema:    Neurological:     Mental Status: He is alert.     Comments: Gait is mild to moderately unsteady.  Patient able to ambulate but at times has to hold onto something because he loses his balance.  Strength is otherwise 5/5 all extremities.  Cranial nerves II to XII intact.  Finger-nose was equivocal, patient had mild difficulty in both extremities while attempting to complete this exam.     ED Results / Procedures / Treatments   Labs (all labs ordered are listed, but only abnormal results are displayed) Labs Reviewed  CBC WITH DIFFERENTIAL/PLATELET -  Abnormal; Notable for the following components:      Result Value   RBC 3.17 (*)    Hemoglobin 10.6 (*)    HCT 31.2 (*)    Neutro Abs 8.2 (*)    Lymphs Abs 0.5 (*)    Monocytes Absolute 1.2 (*)    All other components within normal limits  BASIC METABOLIC PANEL - Abnormal; Notable for the following components:   Sodium 126 (*)    Chloride 84 (*)    Glucose, Bld 170 (*)    BUN 65 (*)    Creatinine, Ser 9.74 (*)    GFR, Estimated 5 (*)    All other components within normal limits  RESP PANEL BY RT-PCR (FLU A&B, COVID) [id number]    EKG EKG Interpretation  Date/Time:  Sunday November 16 2020 12:01:41 EDT Ventricular Rate:  76 PR Interval:  226 QRS Duration: 160 QT Interval:  475 QTC Calculation: 535 R Axis:   69 Text Interpretation: Sinus rhythm Prolonged PR interval Right bundle branch block Confirmed by Thamas Jaegers ([id number]) on 11/16/2020 12:35:53 PM   Radiology CT Head Wo Contrast  Result Date: 11/16/2020 CLINICAL DATA:  Loss of balance in stiff neck for 3 weeks. EXAM: CT HEAD WITHOUT CONTRAST TECHNIQUE: Contiguous axial images were obtained from the base of the skull through the vertex without intravenous contrast. COMPARISON:  CT head dated 06/14/2016. FINDINGS: Brain: No evidence of acute infarction, hemorrhage, hydrocephalus, extra-axial collection or mass lesion/mass effect. There is mild cerebral volume loss with associated ex vacuo dilatation. Periventricular white matter hypoattenuation likely represents chronic small vessel ischemic disease. Vascular: There are vascular calcifications in the carotid siphons. Skull: Normal. Negative for fracture or focal lesion. Sinuses/Orbits: There is mild right ethmoid sinus disease. Other: None. IMPRESSION: No acute intracranial process. Electronically Signed   By: Zerita Boers M.D.   On: 11/16/2020 12:10    Procedures Procedures   Medications Ordered in ED Medications  sodium chloride tablet 1 g (has no administration in time range)  ED Course  I have reviewed the triage vital signs and the nursing notes.  Pertinent labs & imaging results that were available during my care of the patient were reviewed by me and considered in my medical decision making (see chart for details).    MDM Rules/Calculators/A&P                          Patient states symptoms of been ongoing for greater than 3 weeks and closer to 1 month.  He states that he just grew tired of the symptoms not improving he presented to the ER today denies any worsening or improvement of symptoms today.  Labs show sodium of 126 creatinine of 9 BUN of 65 CBC otherwise unremarkable.  Patient would likely benefit from correction of his sodium as well as an MRI further characterizes unsteady gait.  Hospitalist will be consulted.   Final Clinical Impression(s) / ED Diagnoses Final diagnoses:  Hyponatremia  Unsteady gait    Rx / DC Orders ED Discharge Orders    None       Luna Fuse, MD 11/16/20 1252

## 2020-11-17 ENCOUNTER — Observation Stay (HOSPITAL_COMMUNITY): Payer: Medicare Other

## 2020-11-17 DIAGNOSIS — N186 End stage renal disease: Secondary | ICD-10-CM | POA: Diagnosis not present

## 2020-11-17 DIAGNOSIS — E785 Hyperlipidemia, unspecified: Secondary | ICD-10-CM | POA: Diagnosis not present

## 2020-11-17 DIAGNOSIS — R2681 Unsteadiness on feet: Secondary | ICD-10-CM

## 2020-11-17 DIAGNOSIS — E1121 Type 2 diabetes mellitus with diabetic nephropathy: Secondary | ICD-10-CM

## 2020-11-17 DIAGNOSIS — I10 Essential (primary) hypertension: Secondary | ICD-10-CM | POA: Diagnosis not present

## 2020-11-17 DIAGNOSIS — R270 Ataxia, unspecified: Secondary | ICD-10-CM

## 2020-11-17 DIAGNOSIS — Z992 Dependence on renal dialysis: Secondary | ICD-10-CM

## 2020-11-17 LAB — HIV ANTIBODY (ROUTINE TESTING W REFLEX): HIV Screen 4th Generation wRfx: NONREACTIVE

## 2020-11-17 LAB — CBC
HCT: 30.2 % — ABNORMAL LOW (ref 39.0–52.0)
Hemoglobin: 10.3 g/dL — ABNORMAL LOW (ref 13.0–17.0)
MCH: 33.3 pg (ref 26.0–34.0)
MCHC: 34.1 g/dL (ref 30.0–36.0)
MCV: 97.7 fL (ref 80.0–100.0)
Platelets: 228 10*3/uL (ref 150–400)
RBC: 3.09 MIL/uL — ABNORMAL LOW (ref 4.22–5.81)
RDW: 12.6 % (ref 11.5–15.5)
WBC: 9.9 10*3/uL (ref 4.0–10.5)
nRBC: 0 % (ref 0.0–0.2)

## 2020-11-17 LAB — BASIC METABOLIC PANEL
Anion gap: 16 — ABNORMAL HIGH (ref 5–15)
BUN: 80 mg/dL — ABNORMAL HIGH (ref 8–23)
CO2: 26 mmol/L (ref 22–32)
Calcium: 9.2 mg/dL (ref 8.9–10.3)
Chloride: 85 mmol/L — ABNORMAL LOW (ref 98–111)
Creatinine, Ser: 12.06 mg/dL — ABNORMAL HIGH (ref 0.61–1.24)
GFR, Estimated: 4 mL/min — ABNORMAL LOW (ref >60)
Glucose, Bld: 226 mg/dL — ABNORMAL HIGH (ref 70–99)
Potassium: 3.8 mmol/L (ref 3.5–5.1)
Sodium: 127 mmol/L — ABNORMAL LOW (ref 135–145)

## 2020-11-17 LAB — GLUCOSE, CAPILLARY
Glucose-Capillary: 231 mg/dL — ABNORMAL HIGH (ref 70–99)
Glucose-Capillary: 204 mg/dL — ABNORMAL HIGH (ref 70–99)
Glucose-Capillary: 95 mg/dL (ref 70–99)

## 2020-11-17 LAB — TSH: TSH: 0.827 u[IU]/mL (ref 0.350–4.500)

## 2020-11-17 LAB — PHOSPHORUS: Phosphorus: 4.4 mg/dL (ref 2.5–4.6)

## 2020-11-17 LAB — VITAMIN B12: Vitamin B-12: 1737 pg/mL — ABNORMAL HIGH (ref 180–914)

## 2020-11-17 MED ORDER — SODIUM CHLORIDE 0.9 % IV SOLN: 100.0000 mL | INTRAVENOUS | Status: DC | PRN

## 2020-11-17 MED ORDER — CARVEDILOL 25 MG PO TABS: 25.0000 mg | ORAL_TABLET | Freq: Two times a day (BID) | ORAL | 2 refills | Status: DC

## 2020-11-17 MED ORDER — LIDOCAINE-PRILOCAINE 2.5-2.5 % EX CREA: 1.0000 "application " | TOPICAL_CREAM | CUTANEOUS | Status: DC | PRN

## 2020-11-17 MED ORDER — CLONIDINE HCL 0.2 MG PO TABS: 0.2000 mg | ORAL_TABLET | Freq: Two times a day (BID) | ORAL | 2 refills | Status: DC

## 2020-11-17 MED ORDER — AMLODIPINE BESYLATE 10 MG PO TABS: 10.0000 mg | ORAL_TABLET | Freq: Every day | ORAL | 2 refills | Status: DC

## 2020-11-17 MED ORDER — LIDOCAINE HCL (PF) 1 % IJ SOLN: 5.0000 mL | INTRAMUSCULAR | Status: DC | PRN

## 2020-11-17 MED ORDER — HEPARIN SODIUM (PORCINE) 1000 UNIT/ML DIALYSIS: 40.0000 [IU]/kg | INTRAMUSCULAR | Status: DC | PRN

## 2020-11-17 MED ORDER — MECLIZINE HCL 25 MG PO TABS: 25.0000 mg | ORAL_TABLET | Freq: Three times a day (TID) | ORAL | 0 refills | Status: DC | PRN

## 2020-11-17 MED ORDER — PENTAFLUOROPROP-TETRAFLUOROETH EX AERO: 1.0000 "application " | INHALATION_SPRAY | CUTANEOUS | Status: DC | PRN

## 2020-11-17 MED ORDER — CHLORHEXIDINE GLUCONATE CLOTH 2 % EX PADS
6.0000 | MEDICATED_PAD | Freq: Every day | CUTANEOUS | Status: DC
  Administered 2020-11-17: 6 via TOPICAL

## 2020-11-17 MED ORDER — MINOXIDIL 10 MG PO TABS: 5.0000 mg | ORAL_TABLET | Freq: Every day | ORAL | Status: DC

## 2020-11-17 NOTE — TOC Initial Note (Signed)
Transition of Care [facility]) - Initial/Assessment Note    Patient Details  Name: [name] MRN: [id number] Date of Birth: 02-14-1951  Transition of Care [facility]) CM/SW Contact:    Salome Arnt, LCSW Phone Number: 11/17/2020, 11:04 AM  Clinical Narrative:  Pt admitted due to ataxia. PT evaluated pt and recommend SNF. LCSW discussed recommendations with pt and pt's wife. Pt states his wife is at home all the time with him. Pt has dialysis on Monday, Wednesday, Friday schedule at [facility] and he drives himself. Discussed SNF and pt refuses. LCSW offered home health and pt also does not feel this is necessary. He states his blood pressure is his only issue right now. MD notified. Pt aware to contact PCP if home health is needed after d/c.                   Expected Discharge Plan: Home/Self Care Barriers to Discharge: Barriers Resolved   Patient Goals and CMS Choice Patient states their goals for this hospitalization and ongoing recovery are:: return home   Choice offered to / list presented to : Patient  Expected Discharge Plan and Services Expected Discharge Plan: Home/Self Care In-house Referral: Clinical Social Work     Living arrangements for the past 2 months: Single Family Home                 DME Arranged: N/A DME Agency: NA       HH Arranged: Refused HH          Prior Living Arrangements/Services Living arrangements for the past 2 months: Single Family Home Lives with:: Spouse Patient language and need for interpreter reviewed:: Yes Do you feel safe going back to the place where you live?: Yes      Need for Family Participation in Patient Care: Yes (Comment)     Criminal Activity/Legal Involvement Pertinent to Current Situation/Hospitalization: No - Comment as needed  Activities of Daily Living Home Assistive Devices/Equipment: None ADL Screening (condition at time of admission) Patient's cognitive ability adequate to safely complete  daily activities?: Yes Is the patient deaf or have difficulty hearing?: No Does the patient have difficulty seeing, even when wearing glasses/contacts?: No Does the patient have difficulty concentrating, remembering, or making decisions?: No Patient able to express need for assistance with ADLs?: Yes Does the patient have difficulty dressing or bathing?: No Independently performs ADLs?: Yes (appropriate for developmental age) Does the patient have difficulty walking or climbing stairs?: No Weakness of Legs: Both Weakness of Arms/Hands: None  Permission Sought/Granted                  Emotional Assessment   Attitude/Demeanor/Rapport: Engaged Affect (typically observed): Accepting Orientation: : Oriented to Self,Oriented to Place,Oriented to  Time,Oriented to Situation Alcohol / Substance Use: Not Applicable Psych Involvement: No (comment)  Admission diagnosis:  Ataxia [R27.0] Hyponatremia [E87.1] Unsteady gait [R26.81] Patient Active Problem List   Diagnosis Date Noted  . Ataxia 11/16/2020  . History of adenomatous polyp of colon 09/14/2016  . CAD (coronary artery disease)   . Secondary hypertension, unspecified   . Acute diastolic heart failure ([place])   . SOB (shortness of breath) 03/31/2015  . Volume overload 03/31/2015  . NSTEMI (non-ST elevated myocardial infarction) ([place]) 03/31/2015  . ESRD on dialysis ([place])   . Hypoxia 03/21/2015  . Fluid overload 03/21/2015  . End stage renal disease ([place]) 03/07/2014  . Chronic kidney disease 01/29/2014  . Acute on chronic renal failure ([place]) 01/14/2014  .  Anemia of chronic disease 01/14/2014  . Hyponatremia 01/14/2014  . Hyperkalemia 01/14/2014  . Type 2 diabetes mellitus with diabetic nephropathy ([place]) 09/02/2009  . HLD (hyperlipidemia) 09/02/2009  . Essential hypertension 09/02/2009  . Coronary atherosclerosis 09/02/2009   PCP:  [place]:   [facility] [address], [place] -  [place] [address] [place] [address] Phone: [phone] Fax: [phone]     Social Determinants of Health (SDOH) Interventions    Readmission Risk Interventions No flowsheet data found.

## 2020-11-17 NOTE — Discharge Summary (Signed)
Physician Discharge Summary  [name] GGE:[id number] DOB: 1951/01/02 DOA: 11/16/2020  PCP: [facility] date: 11/16/2020 Discharge date: 11/17/2020  Time spent: 35 minutes  Recommendations for Outpatient Follow-up:  Repeat CBC to follow hemoglobin trend and stability Repeat basic metabolic panel to follow-up electrolytes stability. Re-Assess blood pressure and further adjust antihypertensive agents as needed.  Discharge Diagnoses:  Active Problems:   Type 2 diabetes mellitus with diabetic nephropathy (HCC)   HLD (hyperlipidemia)   Essential hypertension   Anemia of chronic disease   Hyponatremia   ESRD on dialysis ([place])   CAD (coronary artery disease)   Ataxia   Unsteady gait   Discharge Condition: Stable and improved.  Discharged home with instruction to follow-up with 10 days.  CODE STATUS: Full code.  Diet recommendation: Heart healthy multifactorial-diet.  Filed Weights   11/16/20 1044 11/17/20 1325  Weight: 81.6 kg 81.9 kg    History of present illness:  As per H&P written by Dr. Roderic Palau on 11/16/20 [name] is a 70 y.o. male with medical history significant of hypertension, diabetes, coronary artery disease status post CABG, end-stage renal disease on hemodialysis, presents to the hospital with complaints of dizziness, ataxia and difficulty walking.  Patient denies any lightheadedness or feelings of passing out.  He has not had any chest pain, shortness of breath, palpitations.  He reports having these symptoms of ataxia and difficulty walking (feeling as though he will fall over) for a few weeks now.  He does not note leaning towards any specific side.  Denies any unilateral weakness or numbness.  He has not had any recent changes in vision.  Reports that his symptoms did get worse on Friday after dialysis when he had noticed that his blood sugar was over 400 and blood pressure was over 200.  He reports that he had taken his  medications that day.  Reports that his symptoms did mildly improve after his blood pressure and blood sugar  improved by the following day.  He has not had any fever, although he did report some chills last week.  He has not had any cough, but feels as though he did have some allergies and was taking some allergy medication last week.  He had 1 episode of vomiting last Friday when his sugar was elevated, but none since then.  His bowel movements have been normal.  He has not been started on any other new medications recently.  ED Course: In the emergency room, vitals were noted to be stable.  Labs were relatively unrevealing.  His gait was checked and noted to be mild to moderately unsteady.  It was noted that he had to hold onto things at times so he would not lose his balance.  He was also noted that his finger-to-nose test was equivocal bilaterally.  He was noted to be mildly hyponatremic.  The patient has been referred for admission for further work-up  Hospital Course:  1-ataxia -Normal TSH and B12 -CT scan and MRI negative for acute intracranial abnormality and ischemia. -Vestibular abnormality and perhaps adrenal insufficiency suspected. -We will recommend close blood pressure monitoring and if needed treatment with midodrine -Antihypertensive medications has been adjusted for better blood pressure control -Patient advised to follow heart healthy diet -Physical therapy recommended skilled nursing facility for rehabilitation which has been declined by patient. -As needed meclizine has been given to assist with dizziness.  2-end-stage renal disease -Appreciate nephrology service assistance and recommendation -Patient received dialysis successfully 11/17/2020 -  Next dialysis treatment as an outpatient on 11/19/2020.  3-anemia of chronic disease -IV iron and Epogen therapy as per nephrology discretion.  4-type 2 diabetes mellitus -Resume home hypoglycemic regimen -Continue to follow CBGs and  A1c as an outpatient.  5-history of coronary artery disease status post CABG -No complaint of chest pain or shortness of breath: -Continue the use of aspirin, statins and beta-blockers.   Procedures:  See below for x-ray report  Inpatient hemodialysis provided.  Consultations:  Nephrology service  Discharge Exam: Vitals:   11/17/20 1735 11/17/20 1750  BP: (!) 150/68 (!) 156/68  Pulse: 80 83  Resp: 14 16  Temp:  98.2 F (36.8 C)  SpO2:      General: No chest pain, no nausea, no vomiting.  Reports feeling better, like to go home. Cardiovascular: S1 and S2, no rubs, no gallops, no JVD Respiratory: Clear to auscultation bilaterally; no using accessory muscles.  Good oxygen saturation on room air Abdomen: Soft, nontender, distended, positive bowel sounds Extremities: No cyanosis or clubbing.  Discharge Instructions   Discharge Instructions    Diet - low sodium heart healthy   Complete by: As directed    Discharge instructions   Complete by: As directed    Follow heart healthy/low-sodium diet -Arrange follow-up with PCP in 10 days Take medications as prescribed.     Allergies as of 11/17/2020      Reactions   Shellfish Allergy Hives      Medication List    STOP taking these medications   doxycycline 100 MG capsule Commonly known as: VIBRAMYCIN     TAKE these medications   acetaminophen 500 MG tablet Commonly known as: TYLENOL Take 500 mg by mouth every 6 (six) hours as needed for mild pain or moderate pain.   albuterol 108 (90 Base) MCG/ACT inhaler Commonly known as: VENTOLIN HFA Inhale 2 puffs into the lungs every 6 (six) hours as needed.   amLODipine 10 MG tablet Commonly known as: NORVASC Take 1 tablet (10 mg total) by mouth daily.   aspirin EC 325 MG tablet Take 325mg s once daily on non dialysis days. Dialysis days are Mon, Wed, Fri   atorvastatin 80 MG tablet Commonly known as: LIPITOR Take 1 tablet (80 mg total) by mouth daily at 6 PM.    carvedilol 25 MG tablet Commonly known as: COREG Take 1 tablet (25 mg total) by mouth 2 (two) times daily.   cloNIDine 0.2 MG tablet Commonly known as: CATAPRES Take 1 tablet (0.2 mg total) by mouth 2 (two) times daily. What changed:   medication strength  how much to take  when to take this  additional instructions   DAILY VITAMIN PO Take 1 tablet by mouth daily as needed (supplement).   diphenhydramine-acetaminophen 25-500 MG Tabs tablet Commonly known as: TYLENOL PM Take 1 tablet by mouth at bedtime as needed (sleep).   Fish Oil 1000 MG Caps Take 1,000 mg by mouth daily.   insulin glargine 100 UNIT/ML injection Commonly known as: LANTUS Inject 10-20 Units into the skin at bedtime.   latanoprost 0.005 % ophthalmic solution Commonly known as: XALATAN Place 2 drops into both eyes 2 (two) times daily.   magnesium hydroxide 400 MG/5ML suspension Commonly known as: MILK OF MAGNESIA Take 15 mLs by mouth daily as needed for mild constipation.   meclizine 25 MG tablet Commonly known as: ANTIVERT Take 1 tablet (25 mg total) by mouth 3 (three) times daily as needed for dizziness.   minoxidil 10 MG  tablet Commonly known as: LONITEN Take 0.5 tablets (5 mg total) by mouth at bedtime. Patient does not take as prescribed. Sometimes takes a 1/2 tablet depending on BP readings What changed: how much to take   Nitrostat 0.4 MG SL tablet Generic drug: nitroGLYCERIN Place 0.4 mg under the tongue every 5 (five) minutes as needed for chest pain.   polyethylene glycol-electrolytes 420 g solution Commonly known as: TriLyte Take 4,000 mLs by mouth as directed.   zolpidem 10 MG tablet Commonly known as: AMBIEN Take 10 mg by mouth at bedtime as needed for sleep.      Allergies  Allergen Reactions  . Shellfish Allergy Hives    Follow-up Information    [facility] Schedule an appointment as soon as possible for a visit in 10 day(s).   Contact  information: [address] [place] [address] [phone]               The results of significant diagnostics from this hospitalization (including imaging, microbiology, ancillary and laboratory) are listed below for reference.    Significant Diagnostic Studies: CT Head Wo Contrast  Result Date: 11/16/2020 CLINICAL DATA:  Loss of balance in stiff neck for 3 weeks. EXAM: CT HEAD WITHOUT CONTRAST TECHNIQUE: Contiguous axial images were obtained from the base of the skull through the vertex without intravenous contrast. COMPARISON:  CT head dated 06/14/2016. FINDINGS: Brain: No evidence of acute infarction, hemorrhage, hydrocephalus, extra-axial collection or mass lesion/mass effect. There is mild cerebral volume loss with associated ex vacuo dilatation. Periventricular white matter hypoattenuation likely represents chronic small vessel ischemic disease. Vascular: There are vascular calcifications in the carotid siphons. Skull: Normal. Negative for fracture or focal lesion. Sinuses/Orbits: There is mild right ethmoid sinus disease. Other: None. IMPRESSION: No acute intracranial process. Electronically Signed   By: Zerita Boers M.D.   On: 11/16/2020 12:10   MR BRAIN WO CONTRAST  Result Date: 11/17/2020 CLINICAL DATA:  Dizziness and confusion EXAM: MRI HEAD WITHOUT CONTRAST TECHNIQUE: Multiplanar, multiecho pulse sequences of the brain and surrounding structures were obtained without intravenous contrast. COMPARISON:  None. FINDINGS: Brain: There is no acute infarction or intracranial hemorrhage. There is no intracranial mass, mass effect, or edema. There is no hydrocephalus or extra-axial fluid collection. Patchy T2 hyperintensity in the supratentorial white matter is nonspecific but may reflect mild chronic microvascular ischemic changes. Prominence of the ventricles and sulci reflects mild generalized parenchymal volume loss. A punctate focus of susceptibility is present in the left  frontoparietal subcortical white matter. Additional focus is present in the right occipitotemporal region. These may reflect chronic microhemorrhages or mineralization. Vascular: Major vessel flow voids at the skull base are preserved. Skull and upper cervical spine: Normal marrow signal is preserved. Sinuses/Orbits: Paranasal sinuses are aerated. Orbits are unremarkable. Other: Sella is unremarkable.  Mastoid air cells are clear. IMPRESSION: No evidence of recent infarction, hemorrhage, or mass. Mild chronic microvascular ischemic changes.  Is Electronically Signed   By: Macy Mis M.D.   On: 11/17/2020 12:15    Microbiology: Recent Results (from the past 240 hour(s))  Resp Panel by RT-PCR (Flu A&B, Covid) Nasopharyngeal Swab     Status: None   Collection Time: 11/16/20  1:03 PM   Specimen: Nasopharyngeal Swab; Nasopharyngeal(NP) swabs in vial transport medium  Result Value Ref Range Status   SARS Coronavirus 2 by RT PCR NEGATIVE NEGATIVE Final    Comment: (NOTE) SARS-CoV-2 target nucleic acids are NOT DETECTED.  The SARS-CoV-2 RNA  is generally detectable in upper respiratory specimens during the acute phase of infection. The lowest concentration of SARS-CoV-2 viral copies this assay can detect is 138 copies/mL. A negative result does not preclude SARS-Cov-2 infection and should not be used as the sole basis for treatment or other patient management decisions. A negative result may occur with  improper specimen collection/handling, submission of specimen other than nasopharyngeal swab, presence of viral mutation(s) within the areas targeted by this assay, and inadequate number of viral copies(<138 copies/mL). A negative result must be combined with clinical observations, patient history, and epidemiological information. The expected result is Negative.  Fact Sheet for Patients:  EntrepreneurPulse.com.au  Fact Sheet for Healthcare Providers:   IncredibleEmployment.be  This test is no t yet approved or cleared by the [place] FDA and  has been authorized for detection and/or diagnosis of SARS-CoV-2 by FDA under an Emergency Use Authorization (EUA). This EUA will remain  in effect (meaning this test can be used) for the duration of the COVID-19 declaration under Section 564(b)(1) of the Act, 21 U.S.C.section 360bbb-3(b)(1), unless the authorization is terminated  or revoked sooner.       Influenza A by PCR NEGATIVE NEGATIVE Final   Influenza B by PCR NEGATIVE NEGATIVE Final    Comment: (NOTE) The Xpert Xpress SARS-CoV-2/FLU/RSV plus assay is intended as an aid in the diagnosis of influenza from Nasopharyngeal swab specimens and should not be used as a sole basis for treatment. Nasal washings and aspirates are unacceptable for Xpert Xpress SARS-CoV-2/FLU/RSV testing.  Fact Sheet for Patients: EntrepreneurPulse.com.au  Fact Sheet for Healthcare Providers: IncredibleEmployment.be  This test is not yet approved or cleared by the [place] FDA and has been authorized for detection and/or diagnosis of SARS-CoV-2 by FDA under an Emergency Use Authorization (EUA). This EUA will remain in effect (meaning this test can be used) for the duration of the COVID-19 declaration under Section 564(b)(1) of the Act, 21 U.S.C. section 360bbb-3(b)(1), unless the authorization is terminated or revoked.  Performed at [facility], [address], [place], [place] [address]      Labs: Basic Metabolic Panel: Recent Labs  Lab 11/16/20 1056 11/17/20 0624  NA 126* 127*  K 3.6 3.8  CL 84* 85*  CO2 30 26  GLUCOSE 170* 226*  BUN 65* 80*  CREATININE 9.74* 12.06*  CALCIUM 9.1 9.2  PHOS  --  4.4   CBC: Recent Labs  Lab 11/16/20 1056 11/17/20 0624  WBC 10.3 9.9  NEUTROABS 8.2*  --   HGB 10.6* 10.3*  HCT 31.2* 30.2*  MCV 98.4 97.7  PLT 243 228   CBG: Recent Labs   Lab 11/16/20 2012 11/16/20 2132 11/17/20 0719 11/17/20 1119 11/17/20 1824  GLUCAP 298* 266* 231* 204* 95    Signed:  Barton Dubois MD.  [facility] 11/17/2020, 6:55 PM

## 2020-11-17 NOTE — Evaluation (Addendum)
Physical Therapy Evaluation Patient Details Name: [name] MRN: [id number] DOB: 10-05-1950 Today's Date: 11/17/2020   History of Present Illness  [name] is a 70 y.o. male with medical history significant of hypertension, diabetes, coronary artery disease status post CABG, end-stage renal disease on hemodialysis, presents to the hospital with complaints of dizziness, ataxia and difficulty walking.  Patient denies any lightheadedness or feelings of passing out.  He has not had any chest pain, shortness of breath, palpitations.  He reports having these symptoms of ataxia and difficulty walking (feeling as though he will fall over) for a few weeks now.  He does not note leaning towards any specific side.  Denies any unilateral weakness or numbness.  He has not had any recent changes in vision.  Reports that his symptoms did get worse on Friday after dialysis when he had noticed that his blood sugar was over 400 and blood pressure was over 200.  He reports that he had taken his medications that day.  Reports that his symptoms did mildly improve after his blood pressure and blood sugar  improved by the following day.  He has not had any fever, although he did report some chills last week.  He has not had any cough, but feels as though he did have some allergies and was taking some allergy medication last week.  He had 1 episode of vomiting last Friday when his sugar was elevated, but none since then.  His bowel movements have been normal.  He has not been started on any other new medications recently.    Clinical Impression  Patient demonstrated willingness to participate in therapy and was independent with bed mobility. Patient was able to sit at EOB and put his shoes on without assistance. Patient initially demonstrated independent sit to stand but after 5 seconds patient had to sit back down due to weakness. BP and SpO2 was 99% and 116/54 seated and then taken standing at SpO2 99% and BP 91/46.  Patient required min/mod assistance while standing to maintain balance during BP measurement. Patient was returned to bed and nursing staff was notified. Once patient BP normalizes patient will benefit from continued physical therapy in hospital and recommended venue below to increase strength, balance, endurance for safe ADLs and gait.     Follow Up Recommendations SNF;Supervision for mobility/OOB;Supervision - Intermittent    Equipment Recommendations  Rolling walker with 5" wheels;Cane    Recommendations for Other Services       Precautions / Restrictions Precautions Precautions: Fall Precaution Comments: Patient was orthostatic Restrictions Weight Bearing Restrictions: No      Mobility  Bed Mobility Overal bed mobility: Independent               Patient Response: Cooperative  Transfers Overall transfer level: Modified independent Equipment used: None             General transfer comment: Patient was able to stand independently but BP dropped from 116/54 seated to 91/46 standing  Ambulation/Gait                Stairs            Wheelchair Mobility    Modified Rankin (Stroke Patients Only)       Balance Overall balance assessment: Modified Independent  Pertinent Vitals/Pain Pain Assessment: No/denies pain    Home Living Family/patient expects to be discharged to:: Private residence Living Arrangements: Spouse/significant other Available Help at Discharge: Family;Available PRN/intermittently Type of Home: House Home Access: Stairs to enter Entrance Stairs-Rails: Right;Left;Can reach both Entrance Stairs-Number of Steps: 4-6 Home Layout: Laundry or work area in basement;One level Home Equipment: None      Prior Function Level of Independence: Independent               Hand Dominance        Extremity/Trunk Assessment   Upper Extremity Assessment Upper  Extremity Assessment: Generalized weakness    Lower Extremity Assessment Lower Extremity Assessment: Generalized weakness    Cervical / Trunk Assessment Cervical / Trunk Assessment: Normal  Communication   Communication: No difficulties  Cognition Arousal/Alertness: Awake/alert Behavior During Therapy: WFL for tasks assessed/performed Overall Cognitive Status: Within Functional Limits for tasks assessed                                        General Comments General comments (skin integrity, edema, etc.): Patient demonstrated independent seated balance but was unsteady while standing possibly related to drop in BP    Exercises     Assessment/Plan    PT Assessment Patient needs continued PT services  PT Problem List Decreased strength;Decreased activity tolerance;Decreased balance;Decreased safety awareness       PT Treatment Interventions DME instruction;Gait training;Functional mobility training;Therapeutic activities;Therapeutic exercise;Balance training;Patient/family education    PT Goals (Current goals can be found in the Care Plan section)  Acute Rehab PT Goals Patient Stated Goal: return home PT Goal Formulation: With patient/family Time For Goal Achievement: 12/01/20 Potential to Achieve Goals: Good    Frequency Min 2X/week   Barriers to discharge        Co-evaluation               AM-PAC PT "6 Clicks" Mobility  Outcome Measure Help needed turning from your back to your side while in a flat bed without using bedrails?: None Help needed moving from lying on your back to sitting on the side of a flat bed without using bedrails?: None Help needed moving to and from a bed to a chair (including a wheelchair)?: None Help needed standing up from a chair using your arms (e.g., wheelchair or bedside chair)?: None Help needed to walk in hospital room?: A Lot Help needed climbing 3-5 steps with a railing? : A Lot 6 Click Score: 20    End of  Session   Activity Tolerance: Patient tolerated treatment well;Treatment limited secondary to medical complications (Comment) (Patient was able to stand independently but BP dropped from 116/54 seated to 91/46 standing) Patient left: in bed;with call bell/phone within reach;with family/visitor present Nurse Communication: Mobility status PT Visit Diagnosis: Unsteadiness on feet (R26.81);Other abnormalities of gait and mobility (R26.89);Muscle weakness (generalized) (M62.81)    Time: 4196-2229 PT Time Calculation (min) (ACUTE ONLY): 23 min   Charges:   PT Evaluation $PT Eval Moderate Complexity: 1 Mod PT Treatments $Therapeutic Activity: 23-37 mins       1:39 PM, 11/17/20 Jeneen Rinks Cousler SPT  1:39 PM, 11/17/20 Lonell Grandchild, MPT Physical Therapist with [facility] 336 [phone] office [phone] mobile phone

## 2020-11-17 NOTE — Progress Notes (Signed)
Patient with discharge orders for 11/17/2020. Patient setup transport with wife to home as per order. Medication education and discharge summary given to patient. IV to R FA removed, and vital signs taken prior to discharge. Patient left floor via wheelchair at 20:20.

## 2020-11-17 NOTE — Consult Note (Signed)
Reason for Consult: Continuity of ESRD care Referring Physician: Barton Dubois MD [facility])  HPI:  70 year old African-American man with past medical history significant for hypertension, type 2 diabetes mellitus, coronary artery disease status post CABG and end-stage renal disease on hemodialysis on a Monday/Wednesday/Friday schedule.  Presented to the emergency room yesterday with complaints of increased dizziness, imbalance and difficulty walking for the past 3 days-notably developed rather suddenly after hemodialysis on Friday when he noticed that his blood sugars were elevated greater than 846 with systolic blood pressure greater than 200.  Of note, he reports that he recently discontinued his carvedilol and minoxidil after having intradialytic hypotension on Wednesday.  He denies any focal weakness or facial asymmetry/numbness and does not have any problems with speech or swallowing.  He denies any cough, shortness of breath, chest pain or fever but reports some intermittent chills.  He denies any dysuria, urgency, frequency, flank pain or hematuria.  He gets his hemodialysis on a MWF schedule at [facility] and reports an EDW of 86 kg (likely inaccurate based on admission weight).  Past Medical History:  Diagnosis Date  . Arthritis   . CAD (coronary artery disease)    STEMI with LAD stent by [facility] in 2005  . Dependence on renal dialysis ([place])   . Depression   . Diabetes mellitus without complication ([place])   . History of kidney stones   . Hypertension   . Kidney infection   . MI (mitral incompetence)   . Myocardial infarction ([place])   . Seasonal allergies     Past Surgical History:  Procedure Laterality Date  . AV FISTULA PLACEMENT Left 01/30/2014   Procedure: INSERTION OF ARTERIOVENOUS (AV) GORE-TEX GRAFT LEFT UPPER ARM;  Surgeon: Elam Dutch, MD;  Location: [place];  Service: Vascular;  Laterality: Left;  . BACK SURGERY    . CARDIAC CATHETERIZATION N/A 04/01/2015   Procedure:  Right/Left Heart Cath and Coronary Angiography;  Surgeon: Leonie Man, MD;  Location: [place] [facility];  Service: Cardiovascular;  Laterality: N/A;  . COLONOSCOPY N/A 10/12/2016   Procedure: COLONOSCOPY;  Surgeon: Daneil Dolin, MD;  [facility];  Service: Endoscopy;  Laterality: N/A;  2:00pm  . COLONOSCOPY W/ POLYPECTOMY    . CORONARY ANGIOPLASTY WITH STENT PLACEMENT  2008  . CORONARY ARTERY BYPASS GRAFT N/A 04/03/2015   Procedure: CORONARY ARTERY BYPASS GRAFTING (CABG) x 4 (LIMA to LAD, SVG to DIAGONAL, SVG to OM1, and SVG to RCA) with EVH from right greater saphenous thigh and partial lower leg vein ;  Surgeon: Ivin Poot, MD;  Location: [place];  Service: Open Heart Surgery;  Laterality: N/A;  . EYE SURGERY Right    lens implant  . INSERTION OF DIALYSIS CATHETER Right 01/30/2014   Procedure: INSERTION OF DIALYSIS CATHETER-RIGHT INTERNAL JUGULAR;  Surgeon: Elam Dutch, MD;  Location: [place];  Service: Vascular;  Laterality: Right;  . POLYPECTOMY  10/12/2016   Procedure: POLYPECTOMY;  Surgeon: Daneil Dolin, MD;  [facility];  Service: Endoscopy;;  colon  . SPINE SURGERY    . TEE WITHOUT CARDIOVERSION N/A 04/03/2015   Procedure: TRANSESOPHAGEAL ECHOCARDIOGRAM (TEE);  Surgeon: Ivin Poot, MD;  Location: [place];  Service: Open Heart Surgery;  Laterality: N/A;    Family History  Problem Relation Age of Onset  . Diabetes Mother   . Hypertension Mother   . Diabetes Father   . Hypertension Father   . Diabetes Other  Social History:  reports that he has never smoked. He quit smokeless tobacco use about 10 years ago.  His smokeless tobacco use included chew. He reports that he does not drink alcohol and does not use drugs.  Allergies:  Allergies  Allergen Reactions  . Shellfish Allergy Hives    Medications:  Scheduled: . aspirin EC  325 mg Oral Q T,Th,S,Su  . carvedilol  25 mg Oral BID  . cloNIDine  0.2 mg Oral BID  . enoxaparin (LOVENOX)  injection  30 mg Subcutaneous Q24H  . insulin aspart  0-15 Units Subcutaneous TID WC  . insulin aspart  0-5 Units Subcutaneous QHS  . insulin glargine  10 Units Subcutaneous QHS  . latanoprost  2 drop Both Eyes BID  . minoxidil  5 mg Oral QHS    BMET    Component Value Date/Time   NA 127 (L) 11/17/2020 0624   K 3.8 11/17/2020 0624   CL 85 (L) 11/17/2020 0624   CO2 26 11/17/2020 0624   GLUCOSE 226 (H) 11/17/2020 0624   BUN 80 (H) 11/17/2020 0624   CREATININE 12.06 (H) 11/17/2020 0624   CALCIUM 9.2 11/17/2020 0624   CALCIUM 8.3 (L) 01/14/2014 1708   GFRNONAA 4 (L) 11/17/2020 0624   GFRAA 10 (L) 07/29/2018 0024   CBC Latest Ref Rng & Units 11/17/2020 11/16/2020 07/29/2018  WBC 4.0 - 10.5 K/uL 9.9 10.3 7.2  Hemoglobin 13.0 - 17.0 g/dL 10.3(L) 10.6(L) 10.0(L)  Hematocrit 39.0 - 52.0 % 30.2(L) 31.2(L) 31.5(L)  Platelets 150 - 400 K/uL 228 243 200     CT Head Wo Contrast  Result Date: 11/16/2020 CLINICAL DATA:  Loss of balance in stiff neck for 3 weeks. EXAM: CT HEAD WITHOUT CONTRAST TECHNIQUE: Contiguous axial images were obtained from the base of the skull through the vertex without intravenous contrast. COMPARISON:  CT head dated 06/14/2016. FINDINGS: Brain: No evidence of acute infarction, hemorrhage, hydrocephalus, extra-axial collection or mass lesion/mass effect. There is mild cerebral volume loss with associated ex vacuo dilatation. Periventricular white matter hypoattenuation likely represents chronic small vessel ischemic disease. Vascular: There are vascular calcifications in the carotid siphons. Skull: Normal. Negative for fracture or focal lesion. Sinuses/Orbits: There is mild right ethmoid sinus disease. Other: None. IMPRESSION: No acute intracranial process. Electronically Signed   By: Zerita Boers M.D.   On: 11/16/2020 12:10    Review of Systems  Constitutional: Positive for chills and fatigue. Negative for fever.  HENT: Negative for ear pain, hearing loss, nosebleeds, sinus  pressure and sinus pain.   Eyes: Negative for photophobia and pain.  Respiratory: Negative for cough, chest tightness and shortness of breath.   Cardiovascular: Negative for chest pain and leg swelling.  Gastrointestinal: Positive for vomiting. Negative for abdominal pain, blood in stool, diarrhea and nausea.  Genitourinary: Negative for dysuria, flank pain and hematuria.  Musculoskeletal: Positive for gait problem. Negative for arthralgias, back pain and myalgias.  Neurological: Positive for weakness and light-headedness.   Blood pressure (!) 143/67, pulse 84, temperature 97.8 F (36.6 C), resp. rate 20, height 6\' 5"  (1.956 m), weight 81.6 kg, SpO2 99 %. Physical Exam Vitals and nursing note reviewed.  Constitutional:      General: He is not in acute distress.    Appearance: Normal appearance. He is normal weight. He is not ill-appearing.  HENT:     Head: Normocephalic and atraumatic.     Right Ear: External ear normal.     Left Ear: External ear normal.  Nose: Nose normal.  Eyes:     General: No scleral icterus.    Extraocular Movements: Extraocular movements intact.     Conjunctiva/sclera: Conjunctivae normal.  Cardiovascular:     Rate and Rhythm: Normal rate and regular rhythm.     Pulses: Normal pulses.     Heart sounds: Normal heart sounds. No murmur heard.   Pulmonary:     Effort: Pulmonary effort is normal.     Breath sounds: Normal breath sounds. No wheezing or rales.  Abdominal:     General: Abdomen is flat.     Palpations: Abdomen is soft.     Tenderness: There is no guarding.  Musculoskeletal:     Cervical back: Normal range of motion and neck supple.     Right lower leg: No edema.     Left lower leg: No edema.  Skin:    General: Skin is warm and dry.     Coloration: Skin is not pale.  Neurological:     Mental Status: He is alert and oriented to person, place, and time.  Psychiatric:        Mood and Affect: Mood normal.     Assessment/Plan: 1.   Ataxia: Etiology unclear and being worked up by the hospitalist service to include an MRI to assess for posterior circulation CVA.  I am concerned that he likely had rebound hypertension from abrupt discontinuation of beta-blocker. 2.  End-stage renal disease: We will order for hemodialysis today to continue MWF schedule.  He does not appear to be overtly volume overloaded and does not have any critical electrolyte abnormality. 3.  Hyponatremia: Likely in part from his concomitant hyperglycemia and poor restriction of fluid intake.  Will order ultrafiltration at hemodialysis and reassess.  Discussed fluid restriction <1.2 L a day. 4.  Anemia of chronic kidney disease: Hemoglobin and hematocrit currently at goal and he does not report any overt loss.  We will follow trend to decide on need for restarting ESA. 5.  Secondary hyperparathyroidism: I will check calcium/phosphorus levels today and restart phosp horus binder while continuing renal diet.  Reconcile medications for vitamin D receptor analog/Cinacalcet. 6.  Hypertension: Blood pressures improving with restarting oral antihypertensive therapy; discussed appropriate holding of antihypertensive therapy and cautioned against abrupt discontinuation of clonidine and carvedilol due to rebound hypertension risk that puts him at risk for CVA.  Blaize Epple K. 11/17/2020, 8:06 AM

## 2020-11-17 NOTE — Procedures (Signed)
   HEMODIALYSIS TREATMENT NOTE:  4 hour heparin-free HD completed via LUE AVF (15g ante/retrograde).  Goal met: 2 liters removed without interruption in ultrafiltration.  All blood was returned and hemostasis was achieved in 15 minutes.  No changes from pre-HD assessment.  Rockwell Alexandria, RN

## 2020-11-17 NOTE — Progress Notes (Signed)
Patients wife took patients home  medications home today.

## 2020-11-17 NOTE — Plan of Care (Addendum)
  Problem: Acute Rehab PT Goals(only PT should resolve) Goal: Patient Will Perform Sitting Balance Outcome: Progressing Flowsheets (Taken 11/17/2020 1140) Patient will perform sitting balance: . Independently . 3- 5 min . with no UE support Goal: Patient Will Transfer Sit To/From Stand Outcome: Progressing Flowsheets (Taken 11/17/2020 1140) Patient will transfer sit to/from stand: Independently Goal: Pt Will Transfer Bed To Chair/Chair To Bed Outcome: Progressing Flowsheets (Taken 11/17/2020 1140) Pt will Transfer Bed to Chair/Chair to Bed: Independently Goal: Pt Will Perform Standing Balance Or Pre-Gait Outcome: Progressing Flowsheets (Taken 11/17/2020 1140) Pt will perform standing balance or pre-gait: (without a drop in BP) . Independently . 3- 5 min Goal: Pt Will Ambulate Outcome: Progressing Flowsheets (Taken 11/17/2020 1140) Pt will Ambulate: . 50 feet . Independently    11:42 AM, 11/17/20 Sinclair Ship SPT  1:37 PM, 11/17/20 Lonell Grandchild, MPT Physical Therapist with [facility] [phone] office [phone] mobile phone

## 2020-11-18 LAB — HEMOGLOBIN A1C
Hgb A1c MFr Bld: 8.6 % — ABNORMAL HIGH (ref 4.8–5.6)
Mean Plasma Glucose: 200 mg/dL

## 2020-11-28 DIAGNOSIS — E871 Hypo-osmolality and hyponatremia: Secondary | ICD-10-CM | POA: Insufficient documentation

## 2020-12-04 ENCOUNTER — Encounter (HOSPITAL_COMMUNITY): Payer: Self-pay | Admitting: Emergency Medicine

## 2020-12-04 ENCOUNTER — Other Ambulatory Visit: Payer: Self-pay

## 2020-12-04 ENCOUNTER — Emergency Department (HOSPITAL_COMMUNITY)
Admission: EM | Admit: 2020-12-04 | Discharge: 2020-12-04 | Disposition: A | Discharge status: Home / Self Care | Payer: Medicare Other | Attending: Emergency Medicine | Admitting: Emergency Medicine

## 2020-12-04 ENCOUNTER — Emergency Department (HOSPITAL_COMMUNITY): Payer: Medicare Other

## 2020-12-04 DIAGNOSIS — Z87891 Personal history of nicotine dependence: Secondary | ICD-10-CM | POA: Insufficient documentation

## 2020-12-04 DIAGNOSIS — D539 Nutritional anemia, unspecified: Secondary | ICD-10-CM

## 2020-12-04 DIAGNOSIS — Z79899 Other long term (current) drug therapy: Secondary | ICD-10-CM | POA: Insufficient documentation

## 2020-12-04 DIAGNOSIS — R0602 Shortness of breath: Secondary | ICD-10-CM | POA: Diagnosis not present

## 2020-12-04 DIAGNOSIS — D509 Iron deficiency anemia, unspecified: Secondary | ICD-10-CM | POA: Insufficient documentation

## 2020-12-04 DIAGNOSIS — I2510 Atherosclerotic heart disease of native coronary artery without angina pectoris: Secondary | ICD-10-CM | POA: Diagnosis not present

## 2020-12-04 DIAGNOSIS — N186 End stage renal disease: Secondary | ICD-10-CM | POA: Diagnosis not present

## 2020-12-04 DIAGNOSIS — R531 Weakness: Secondary | ICD-10-CM | POA: Diagnosis present

## 2020-12-04 DIAGNOSIS — Z794 Long term (current) use of insulin: Secondary | ICD-10-CM | POA: Diagnosis not present

## 2020-12-04 DIAGNOSIS — Z992 Dependence on renal dialysis: Secondary | ICD-10-CM | POA: Insufficient documentation

## 2020-12-04 DIAGNOSIS — I120 Hypertensive chronic kidney disease with stage 5 chronic kidney disease or end stage renal disease: Secondary | ICD-10-CM | POA: Insufficient documentation

## 2020-12-04 DIAGNOSIS — R42 Dizziness and giddiness: Secondary | ICD-10-CM

## 2020-12-04 DIAGNOSIS — E1122 Type 2 diabetes mellitus with diabetic chronic kidney disease: Secondary | ICD-10-CM | POA: Insufficient documentation

## 2020-12-04 DIAGNOSIS — Z7982 Long term (current) use of aspirin: Secondary | ICD-10-CM | POA: Diagnosis not present

## 2020-12-04 DIAGNOSIS — Z951 Presence of aortocoronary bypass graft: Secondary | ICD-10-CM | POA: Diagnosis not present

## 2020-12-04 LAB — CBC WITH DIFFERENTIAL/PLATELET
Abs Immature Granulocytes: 0.13 10*3/uL — ABNORMAL HIGH (ref 0.00–0.07)
Basophils Absolute: 0.1 10*3/uL (ref 0.0–0.1)
Basophils Relative: 0 %
Eosinophils Absolute: 0.2 10*3/uL (ref 0.0–0.5)
Eosinophils Relative: 2 %
HCT: 22.1 % — ABNORMAL LOW (ref 39.0–52.0)
Hemoglobin: 7.3 g/dL — ABNORMAL LOW (ref 13.0–17.0)
Immature Granulocytes: 1 %
Lymphocytes Relative: 8 %
Lymphs Abs: 0.9 10*3/uL (ref 0.7–4.0)
MCH: 33 pg (ref 26.0–34.0)
MCHC: 33 g/dL (ref 30.0–36.0)
MCV: 100 fL (ref 80.0–100.0)
Monocytes Absolute: 0.9 10*3/uL (ref 0.1–1.0)
Monocytes Relative: 8 %
Neutro Abs: 9.5 10*3/uL — ABNORMAL HIGH (ref 1.7–7.7)
Neutrophils Relative %: 81 %
Platelets: 276 10*3/uL (ref 150–400)
RBC: 2.21 MIL/uL — ABNORMAL LOW (ref 4.22–5.81)
RDW: 12.6 % (ref 11.5–15.5)
WBC: 11.5 10*3/uL — ABNORMAL HIGH (ref 4.0–10.5)
nRBC: 0 % (ref 0.0–0.2)

## 2020-12-04 LAB — COMPREHENSIVE METABOLIC PANEL
ALT: 92 U/L — ABNORMAL HIGH (ref 0–44)
AST: 102 U/L — ABNORMAL HIGH (ref 15–41)
Albumin: 2.4 g/dL — ABNORMAL LOW (ref 3.5–5.0)
Alkaline Phosphatase: 87 U/L (ref 38–126)
Anion gap: 13 (ref 5–15)
BUN: 29 mg/dL — ABNORMAL HIGH (ref 8–23)
CO2: 29 mmol/L (ref 22–32)
Calcium: 8.4 mg/dL — ABNORMAL LOW (ref 8.9–10.3)
Chloride: 87 mmol/L — ABNORMAL LOW (ref 98–111)
Creatinine, Ser: 6.82 mg/dL — ABNORMAL HIGH (ref 0.61–1.24)
GFR, Estimated: 8 mL/min — ABNORMAL LOW (ref >60)
Glucose, Bld: 341 mg/dL — ABNORMAL HIGH (ref 70–99)
Potassium: 3.6 mmol/L (ref 3.5–5.1)
Sodium: 129 mmol/L — ABNORMAL LOW (ref 135–145)
Total Bilirubin: 0.7 mg/dL (ref 0.3–1.2)
Total Protein: 6.7 g/dL (ref 6.5–8.1)

## 2020-12-04 LAB — BRAIN NATRIURETIC PEPTIDE: B Natriuretic Peptide: 689 pg/mL — ABNORMAL HIGH (ref 0.0–100.0)

## 2020-12-04 LAB — POC OCCULT BLOOD, ED: Fecal Occult Bld: NEGATIVE

## 2020-12-04 LAB — MAGNESIUM: Magnesium: 2 mg/dL (ref 1.7–2.4)

## 2020-12-04 LAB — PREPARE RBC (CROSSMATCH)

## 2020-12-04 MED ORDER — MECLIZINE HCL 12.5 MG PO TABS
25.0000 mg | ORAL_TABLET | Freq: Once | ORAL | Status: AC
  Administered 2020-12-04: 25 mg via ORAL
  Filled 2020-12-04: qty 2

## 2020-12-04 MED ORDER — SODIUM CHLORIDE 0.9 % IV SOLN
10.0000 mL/h | Freq: Once | INTRAVENOUS | Status: AC
  Administered 2020-12-04: 10 mL/h via INTRAVENOUS

## 2020-12-04 NOTE — ED Notes (Signed)
Tolerating blood product well.

## 2020-12-04 NOTE — ED Notes (Signed)
Pt not abe to ambulate.

## 2020-12-04 NOTE — ED Notes (Signed)
Tolerating blood well. 

## 2020-12-04 NOTE — ED Notes (Signed)
Pt wasn't able to ambulate in hall due to dizziness.

## 2020-12-04 NOTE — ED Notes (Signed)
Rate unchanged.  RBCs running at 125 ml/ hr via pump.  NO reaction noted.

## 2020-12-04 NOTE — ED Triage Notes (Signed)
HD pt MWF and HD on yesterday.  CBG 386.  Did not take lantus last night.  Ortho positive 118/56 sitting and 90/? Standing.  C/o dizziness no LOC.  Pt given 500 ml pf NS by EMS>  seen here last week for low Na+.

## 2020-12-04 NOTE — ED Notes (Signed)
Tolerating blood well.  Wife at bedside.

## 2020-12-04 NOTE — ED Notes (Signed)
PRBCs started and verified with Mardee Postin, RN.  Pt calm and resting with wife at bedside.

## 2020-12-04 NOTE — ED Notes (Signed)
PA request that blood run over 3 hours.  Pt is HD pt.

## 2020-12-04 NOTE — ED Provider Notes (Signed)
[facility] EMERGENCY DEPARTMENT Provider Note   CSN: [id number] Arrival date & time: 12/04/20  1126     History Chief Complaint  Patient presents with   Weakness    [name] is a 70 y.o. male.  HPI  Patient with significant medical history of end-stage renal disease dialysis Monday, Wednesday, Friday, hypertension, diabetes, CAD presents to the emergent department for chief complaint of dizziness and leg weakness.  Patient states both of these have been going for months and he cannot take it anymore.  He endorses that the dizziness occurs when he goes from a sitting to a standing position, states he feels off balance and has to sit down, once he sits down this resolves.  He denies change in vision, paresthesias or weakness in the upper or lower extremities, denies recent head trauma, is not on an anticoag.  Patient states he has been eating and drinking without difficulty, he denies alleviating factors.  He also endorses lower leg weakness this is been on for months, states that it is been getting worse, he states today when he stood up he felt his legs were weak and caused him to sit back down.  He states when his legs get weak he denies experiencing chest pain, shortness of breath or becoming diaphoretic.  Patient's wife was at bedside and she does not notice any difference in his weakness and/or dizziness.  Patient's chart was reviewed he was recently mid to the hospital on 06/05 for similar complaints MRI was negative, possible patient has special visual abnormalities perhaps from adrenal insufficiency.  Past Medical History:  Diagnosis Date   Arthritis    CAD (coronary artery disease)    STEMI with LAD stent by [facility] in 2005   Dependence on renal dialysis [facility])    Depression    Diabetes mellitus without complication ([place])    History of kidney stones    Hypertension    Kidney infection    MI (mitral incompetence)    Myocardial infarction ([place])    Seasonal allergies      Patient Active Problem List   Diagnosis Date Noted   Hypo-osmolality and hyponatremia 11/28/2020   Unsteady gait    Ataxia 11/16/2020   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   NPDR (nonproliferative diabetic retinopathy) ([place]) 11/21/2016   Pseudophakia of both eyes 11/21/2016   History of adenomatous polyp of colon 09/14/2016   Coronary artery disease involving native coronary artery of native heart without angina pectoris 12/05/2015   S/P CABG (coronary artery bypass graft) 12/05/2015   Secondary hypertension, unspecified    Acute diastolic heart failure (HCC)    Volume overload 03/31/2015   NSTEMI (non-ST elevated myocardial infarction) ([place]) 03/31/2015   Hypoxia 03/21/2015   Fluid overload, unspecified 08/21/2014   End stage renal disease ([place]) 03/07/2014   Encounter for screening for respiratory tuberculosis 02/08/2014   Shortness of breath 02/05/2014   ESRD (end stage renal disease) ([place]) 02/05/2014   Aftercare including intermittent dialysis ([place]) 02/05/2014   Coagulation defect, unspecified ([place]) 02/05/2014   Disorder of phosphorus metabolism, unspecified 02/05/2014   Hypoglycemia, unspecified 02/05/2014   Iron deficiency anemia, unspecified 02/05/2014   Moderate protein-calorie malnutrition ([place]) 02/05/2014   Other disorders of plasma-protein metabolism, not elsewhere classified 02/05/2014   Pain, unspecified 02/05/2014   Pruritus, unspecified 02/05/2014   Secondary hyperparathyroidism of renal origin ([place]) 02/05/2014   Thrombocytopenia, unspecified ([place]) 02/05/2014   Chronic kidney disease 01/29/2014   Acute on chronic renal failure ([place]) 01/14/2014   Anemia in  chronic kidney disease 01/14/2014   Hyponatremia 01/14/2014   Hyperkalemia 01/14/2014   Type 2 diabetes mellitus without complications ([place]) 25/85/2778   HLD (hyperlipidemia) 09/02/2009   Essential (primary) hypertension 09/02/2009   Coronary atherosclerosis 09/02/2009    Past Surgical History:   Procedure Laterality Date   AV FISTULA PLACEMENT Left 01/30/2014   Procedure: INSERTION OF ARTERIOVENOUS (AV) GORE-TEX GRAFT LEFT UPPER ARM;  Surgeon: Elam Dutch, MD;  Location: [place];  Service: Vascular;  Laterality: Left;   BACK SURGERY     CARDIAC CATHETERIZATION N/A 04/01/2015   Procedure: Right/Left Heart Cath and Coronary Angiography;  Surgeon: Leonie Man, MD;  Location: [place] CV [facility];  Service: Cardiovascular;  Laterality: N/A;   COLONOSCOPY N/A 10/12/2016   Procedure: COLONOSCOPY;  Surgeon: Daneil Dolin, MD;  [facility];  Service: Endoscopy;  Laterality: N/A;  2:00pm   COLONOSCOPY W/ POLYPECTOMY     CORONARY ANGIOPLASTY WITH STENT PLACEMENT  2008   CORONARY ARTERY BYPASS GRAFT N/A 04/03/2015   Procedure: CORONARY ARTERY BYPASS GRAFTING (CABG) x  4 (LIMA to LAD, SVG to DIAGONAL, SVG to OM1, and SVG to RCA) with EVH from right greater saphenous thigh and partial lower leg vein ;  Surgeon: Ivin Poot, MD;  Location: [place];  Service: Open Heart Surgery;  Laterality: N/A;   EYE SURGERY Right    lens implant   INSERTION OF DIALYSIS CATHETER Right 01/30/2014   Procedure: INSERTION OF DIALYSIS CATHETER-RIGHT INTERNAL JUGULAR;  Surgeon: Elam Dutch, MD;  Location: [place];  Service: Vascular;  Laterality: Right;   POLYPECTOMY  10/12/2016   Procedure: POLYPECTOMY;  Surgeon: Daneil Dolin, MD;  [facility];  Service: Endoscopy;;  colon   SPINE SURGERY     TEE WITHOUT CARDIOVERSION N/A 04/03/2015   Procedure: TRANSESOPHAGEAL ECHOCARDIOGRAM (TEE);  Surgeon: Ivin Poot, MD;  Location: [place];  Service: Open Heart Surgery;  Laterality: N/A;       Family History  Problem Relation Age of Onset   Diabetes Mother    Hypertension Mother    Diabetes Father    Hypertension Father    Diabetes Other     Social History   Tobacco Use   Smoking status: Never   Smokeless tobacco: Former    Types: Chew    Quit date: 06/14/2010  Vaping Use    Vaping Use: Never used  Substance Use Topics   Alcohol use: No   Drug use: No    Home Medications Prior to Admission medications   Medication Sig Start Date End Date Taking? Authorizing Provider  acetaminophen (TYLENOL) 500 MG tablet Take 500 mg by mouth every 6 (six) hours as needed for mild pain or moderate pain.   Yes [provider]  amLODipine (NORVASC) 10 MG tablet Take 1 tablet (10 mg total) by mouth daily. 11/17/20  Yes Barton Dubois, MD  carvedilol (COREG) 25 MG tablet Take 1 tablet (25 mg total) by mouth 2 (two) times daily. 11/17/20  Yes Barton Dubois, MD  cloNIDine (CATAPRES) 0.2 MG tablet Take 1 tablet (0.2 mg total) by mouth 2 (two) times daily. 11/17/20  Yes Barton Dubois, MD  diphenhydramine-acetaminophen (TYLENOL PM) 25-500 MG TABS tablet Take 1 tablet by mouth at bedtime as needed (sleep).   Yes [provider]  insulin glargine (LANTUS) 100 UNIT/ML injection Inject 10-20 Units into the skin at bedtime.   Yes [provider]  latanoprost (XALATAN) 0.005 % ophthalmic solution Place 2  drops into both eyes 2 (two) times daily. 08/27/16  Yes [provider]  magnesium hydroxide (MILK OF MAGNESIA) 400 MG/5ML suspension Take 15 mLs by mouth daily as needed for mild constipation.   Yes [provider]  meclizine (ANTIVERT) 25 MG tablet Take 1 tablet (25 mg total) by mouth 3 (three) times daily as needed for dizziness. 11/17/20  Yes Barton Dubois, MD  Multiple Vitamin (DAILY VITAMIN PO) Take 1 tablet by mouth daily as needed (supplement).   Yes [provider]  NITROSTAT 0.4 MG SL tablet Place 0.4 mg under the tongue every 5 (five) minutes as needed for chest pain.  01/23/15  Yes [provider]  Omega-3 Fatty Acids (FISH OIL) 1000 MG CAPS Take 1,000 mg by mouth daily.   Yes [provider]  zolpidem (AMBIEN) 10 MG tablet Take 10 mg by mouth at bedtime as needed for sleep.  05/29/16  Yes [provider]   albuterol (PROVENTIL HFA;VENTOLIN HFA) 108 (90 Base) MCG/ACT inhaler Inhale 2 puffs into the lungs every 6 (six) hours as needed. Patient not taking: No sig reported 07/29/18   Ezequiel Essex, MD  aspirin EC 325 MG tablet Take 325mg s once daily on non dialysis days. Dialysis days are Mon, Wed, Fri Patient not taking: Reported on 12/04/2020    [provider]  atorvastatin (LIPITOR) 80 MG tablet Take 1 tablet (80 mg total) by mouth daily at 6 PM. Patient not taking: No sig reported 04/10/15   Nani Skillern, PA-C  minoxidil (LONITEN) 10 MG tablet Take 0.5 tablets (5 mg total) by mouth at bedtime. Patient does not take as prescribed. Sometimes takes a 1/2 tablet depending on BP readings Patient not taking: Reported on 12/04/2020 11/17/20   Barton Dubois, MD  polyethylene glycol-electrolytes (TRILYTE) 420 g solution Take 4,000 mLs by mouth as directed. Patient not taking: No sig reported 09/14/16   Rourk, Cristopher Estimable, MD    Allergies    Shellfish allergy  Review of Systems   Review of Systems  Constitutional:  Negative for chills and fever.  HENT:  Negative for congestion.   Respiratory:  Negative for shortness of breath.   Cardiovascular:  Negative for chest pain.  Gastrointestinal:  Negative for abdominal pain, nausea and vomiting.  Genitourinary:  Negative for enuresis.  Musculoskeletal:  Negative for back pain.  Skin:  Negative for rash.  Neurological:  Positive for dizziness and weakness. Negative for headaches.  Hematological:  Does not bruise/bleed easily.   Physical Exam Updated Vital Signs BP (!) 147/63   Pulse 86   Temp 99 F (37.2 C)   Resp 17   Ht 6\' 5"  (1.956 m)   Wt 81.6 kg   SpO2 94%   BMI 21.34 kg/m   Physical Exam Vitals and nursing note reviewed.  Constitutional:      General: He is not in acute distress.    Appearance: He is not ill-appearing.  HENT:     Head: Normocephalic and atraumatic.     Nose: No congestion.     Mouth/Throat:      Mouth: Mucous membranes are dry.     Pharynx: Oropharynx is clear. No oropharyngeal exudate or posterior oropharyngeal erythema.  Eyes:     Extraocular Movements: Extraocular movements intact.     Conjunctiva/sclera: Conjunctivae normal.     Pupils: Pupils are equal, round, and reactive to light.  Cardiovascular:     Rate and Rhythm: Normal rate and regular rhythm.     Pulses:  Normal pulses.     Heart sounds: No murmur heard.   No friction rub. No gallop.  Pulmonary:     Effort: No respiratory distress.     Breath sounds: No wheezing, rhonchi or rales.  Abdominal:     Palpations: Abdomen is soft.     Tenderness: There is no abdominal tenderness.  Musculoskeletal:     Comments: Patient has 5 of 5 strength, neurovascular intact in upper lower extremities.  Skin:    General: Skin is warm and dry.     Comments: Patient has a fistula on the left arm, good palpable thrill no signs of infection present.  Neurological:     Mental Status: He is alert.     GCS: GCS eye subscore is 4. GCS verbal subscore is 5. GCS motor subscore is 6.     Sensory: Sensation is intact.     Motor: No weakness.     Coordination: Romberg sign negative. Finger-Nose-Finger Test normal.     Comments: Cranial nerves II through XII are grossly intact  Patient have no difficulty word finding.  Psychiatric:        Mood and Affect: Mood normal.    ED Results / Procedures / Treatments   Labs (all labs ordered are listed, but only abnormal results are displayed) Labs Reviewed  COMPREHENSIVE METABOLIC PANEL - Abnormal; Notable for the following components:      Result Value   Sodium 129 (*)    Chloride 87 (*)    Glucose, Bld 341 (*)    BUN 29 (*)    Creatinine, Ser 6.82 (*)    Calcium 8.4 (*)    Albumin 2.4 (*)    AST 102 (*)    ALT 92 (*)    GFR, Estimated 8 (*)    All other components within normal limits  CBC WITH DIFFERENTIAL/PLATELET - Abnormal; Notable for the following components:   WBC 11.5 (*)     RBC 2.21 (*)    Hemoglobin 7.3 (*)    HCT 22.1 (*)    Neutro Abs 9.5 (*)    Abs Immature Granulocytes 0.13 (*)    All other components within normal limits  BRAIN NATRIURETIC PEPTIDE - Abnormal; Notable for the following components:   B Natriuretic Peptide 689.0 (*)    All other components within normal limits  MAGNESIUM  POC OCCULT BLOOD, ED  TYPE AND SCREEN  PREPARE RBC (CROSSMATCH)    EKG EKG Interpretation  Date/Time:  Thursday December 04 2020 13:11:57 EDT Ventricular Rate:  75 PR Interval:  280 QRS Duration: 170 QT Interval:  469 QTC Calculation: 524 R Axis:   103 Text Interpretation: Sinus rhythm Sinus pause Prolonged PR interval Right bundle branch block Confirmed by Milton Ferguson [phone]) on 12/04/2020 2:18:49 PM  Radiology DG Chest Port 1 View  Result Date: 12/04/2020 CLINICAL DATA:  [place] EXAM: PORTABLE CHEST 1 VIEW COMPARISON:  07/28/2018 FINDINGS: Cardiomegaly status post median sternotomy and CABG. Bandlike atelectasis or scarring in the bilateral lung bases. The visualized skeletal structures are unremarkable. IMPRESSION: 1.  Cardiomegaly. 2.  Bandlike atelectasis or scarring in the bilateral lung bases. Electronically Signed   By: Eddie Candle M.D.   On: 12/04/2020 13:29    Procedures .Critical Care  Date/Time: 12/04/2020 4:00 PM Performed by: Marcello Fennel, PA-C Authorized by: Marcello Fennel, PA-C   Critical care provider statement:    Critical care time (minutes):  45   Critical care time was exclusive of:  Separately billable  procedures and treating other patients   Critical care was necessary to treat or prevent imminent or life-threatening deterioration of the following conditions:  Circulatory failure   Critical care was time spent personally by me on the following activities:  Discussions with consultants, evaluation of patient's response to treatment, examination of patient, ordering and performing treatments and interventions, ordering and  review of laboratory studies, ordering and review of radiographic studies, pulse oximetry, re-evaluation of patient's condition and review of old charts   I assumed direction of critical care for this patient from another provider in my specialty: no     Medications Ordered in ED Medications  meclizine (ANTIVERT) tablet 25 mg (25 mg Oral Given 12/04/20 1313)  0.9 %  sodium chloride infusion (0 mL/hr Intravenous Stopped 12/04/20 1652)    ED Course  I have reviewed the triage vital signs and the nursing notes.  Pertinent labs & imaging results that were available during my care of the patient were reviewed by me and considered in my medical decision making (see chart for details).    MDM Rules/Calculators/A&P                         Initial impression-patient presents with weakness and dizziness.  He is alert, does not appear in acute stress, vital signs reassuring.  Will obtain basic lab work-up, provide patient with fluids, meclizine and reassess.  Work-up-CBC shows leukocytosis with a white count 11.5, macrocytic anemia hemoglobin 7.3 decreased from 10 2 weeks ago, CMP shows hyponatremia 129, hyperglycemia of 341, creatinine 6.82, liver enzymes 102 ALT 92.  Mag 2, Hemoccult negative, chest x-ray unremarkable, patient negative orthostatics, he was unsteady on his feet will defer ambulation at this time.  Reassessment-hemoglobin dropped from 10 down to 7.3 in 2 weeks time, concern for possible GI bleed versus anemia due to chronic disease.  Hemoccult was negative.  Will consult with nephrology for further recommendations.  Updated patient on recommendations from nephrology, he is in agreement with this, will go ahead and transfuse patient.  Patient was reassessed after blood transfusion, states he feels much better, has no more dizziness this time.  Vital signs remained stable, patient is agreement for discharge.  Consult-spoke with Dr. Royce Macadamia nephrology, she recommends 1 unit of blood to  treat for his anemia.  And if he is stable and feeling well he can be discharged home.  They will manage his anemia further.  Rule out-I have low suspicion for intracranial head bleed intracranial mass as patient denies recent falls, denies headaches, change in vision, paresthesia or weakness in the upper or lower extremities.  low suspicion for CVA and/or subarachnoid bleed as there is no focal deficits present my exam, patient states his dizziness has felt unchanged for the last month, patient was recently seen for this same issue 2 weeks ago with a benign work-up, including MRI of the brain which was negative.  Patient did seem to be slightly off balance on my exam but I suspect this is secondary due to deconditioned state as well as being anemic as his dizziness resolved after transfusion..  Low suspicion for systemic infection and/or meningitis as there is no meningeal sign present my exam, patient is nontoxic-appearing, vital signs reassuring.  Patient does have slight increase in his white count but I suspect this is more acute phase reactant as he has no other complaints.  Plan-  Dizziness since resolved-suspect multifactorial anemia as well as deconditioned state.  We will  have him continue with all home medications, follow-up with nephrology for further management of his anemia.  Vital signs have remained stable, no indication for hospital admission.  Patient discussed with attending and they agreed with assessment and plan.  Patient given at home care as well strict return precautions.  Patient verbalized that they understood agreed to said plan.  Final Clinical Impression(s) / ED Diagnoses Final diagnoses:  Dizziness  Macrocytic anemia    Rx / DC Orders ED Discharge Orders     None        Aron Baba 12/04/20 1956    Milton Ferguson, MD 12/08/20 [phone]

## 2020-12-04 NOTE — ED Notes (Signed)
Assisted pt with standing at bedside, denies dizziness.

## 2020-12-04 NOTE — Discharge Instructions (Addendum)
I suspect your dizziness is from the anemia.  You have received 1 unit of blood in the emergency department.  Please continue with all home medications as prescribed.    Please follow-up with your nephrologist for continued management of your anemia.  Come back to the emergency department if you develop chest pain, shortness of breath, severe abdominal pain, uncontrolled nausea, vomiting, diarrhea.

## 2020-12-08 LAB — BPAM RBC
Blood Product Expiration Date: 202207292359
Blood Product Expiration Date: 202207292359
ISSUE DATE / TIME: 202206231650
Unit Type and Rh: 1700
Unit Type and Rh: 1700

## 2020-12-08 LAB — TYPE AND SCREEN
ABO/RH(D): B POS
Antibody Screen: NEGATIVE
Unit division: 0
Unit division: 0

## 2020-12-10 ENCOUNTER — Encounter (HOSPITAL_COMMUNITY): Payer: Self-pay

## 2020-12-10 ENCOUNTER — Other Ambulatory Visit: Payer: Self-pay

## 2020-12-10 ENCOUNTER — Emergency Department (HOSPITAL_COMMUNITY): Payer: Medicare Other

## 2020-12-10 ENCOUNTER — Inpatient Hospital Stay (HOSPITAL_COMMUNITY)
Admission: EM | Admit: 2020-12-10 | Discharge: 2020-12-17 | DRG: 441 | Disposition: A | Discharge status: Home Health | Payer: Medicare Other | Attending: Internal Medicine | Admitting: Internal Medicine

## 2020-12-10 DIAGNOSIS — Z955 Presence of coronary angioplasty implant and graft: Secondary | ICD-10-CM

## 2020-12-10 DIAGNOSIS — E785 Hyperlipidemia, unspecified: Secondary | ICD-10-CM | POA: Diagnosis present

## 2020-12-10 DIAGNOSIS — Z20822 Contact with and (suspected) exposure to covid-19: Secondary | ICD-10-CM | POA: Diagnosis present

## 2020-12-10 DIAGNOSIS — R270 Ataxia, unspecified: Secondary | ICD-10-CM | POA: Diagnosis present

## 2020-12-10 DIAGNOSIS — K750 Abscess of liver: Principal | ICD-10-CM | POA: Diagnosis present

## 2020-12-10 DIAGNOSIS — Z794 Long term (current) use of insulin: Secondary | ICD-10-CM

## 2020-12-10 DIAGNOSIS — I708 Atherosclerosis of other arteries: Secondary | ICD-10-CM | POA: Diagnosis present

## 2020-12-10 DIAGNOSIS — I5032 Chronic diastolic (congestive) heart failure: Secondary | ICD-10-CM | POA: Diagnosis present

## 2020-12-10 DIAGNOSIS — Z87891 Personal history of nicotine dependence: Secondary | ICD-10-CM | POA: Diagnosis not present

## 2020-12-10 DIAGNOSIS — I132 Hypertensive heart and chronic kidney disease with heart failure and with stage 5 chronic kidney disease, or end stage renal disease: Secondary | ICD-10-CM | POA: Diagnosis present

## 2020-12-10 DIAGNOSIS — D631 Anemia in chronic kidney disease: Secondary | ICD-10-CM | POA: Diagnosis present

## 2020-12-10 DIAGNOSIS — Z951 Presence of aortocoronary bypass graft: Secondary | ICD-10-CM

## 2020-12-10 DIAGNOSIS — I4510 Unspecified right bundle-branch block: Secondary | ICD-10-CM | POA: Diagnosis present

## 2020-12-10 DIAGNOSIS — E119 Type 2 diabetes mellitus without complications: Secondary | ICD-10-CM

## 2020-12-10 DIAGNOSIS — M1990 Unspecified osteoarthritis, unspecified site: Secondary | ICD-10-CM | POA: Diagnosis present

## 2020-12-10 DIAGNOSIS — Z7982 Long term (current) use of aspirin: Secondary | ICD-10-CM

## 2020-12-10 DIAGNOSIS — I2510 Atherosclerotic heart disease of native coronary artery without angina pectoris: Secondary | ICD-10-CM | POA: Diagnosis present

## 2020-12-10 DIAGNOSIS — K5900 Constipation, unspecified: Secondary | ICD-10-CM | POA: Diagnosis not present

## 2020-12-10 DIAGNOSIS — Z833 Family history of diabetes mellitus: Secondary | ICD-10-CM | POA: Diagnosis not present

## 2020-12-10 DIAGNOSIS — E1165 Type 2 diabetes mellitus with hyperglycemia: Secondary | ICD-10-CM | POA: Diagnosis not present

## 2020-12-10 DIAGNOSIS — M898X9 Other specified disorders of bone, unspecified site: Secondary | ICD-10-CM | POA: Diagnosis present

## 2020-12-10 DIAGNOSIS — E1122 Type 2 diabetes mellitus with diabetic chronic kidney disease: Secondary | ICD-10-CM | POA: Diagnosis present

## 2020-12-10 DIAGNOSIS — N186 End stage renal disease: Secondary | ICD-10-CM | POA: Diagnosis present

## 2020-12-10 DIAGNOSIS — E871 Hypo-osmolality and hyponatremia: Secondary | ICD-10-CM | POA: Diagnosis present

## 2020-12-10 DIAGNOSIS — Z8249 Family history of ischemic heart disease and other diseases of the circulatory system: Secondary | ICD-10-CM

## 2020-12-10 DIAGNOSIS — E1151 Type 2 diabetes mellitus with diabetic peripheral angiopathy without gangrene: Secondary | ICD-10-CM | POA: Diagnosis present

## 2020-12-10 DIAGNOSIS — N2581 Secondary hyperparathyroidism of renal origin: Secondary | ICD-10-CM | POA: Diagnosis present

## 2020-12-10 DIAGNOSIS — I252 Old myocardial infarction: Secondary | ICD-10-CM | POA: Diagnosis not present

## 2020-12-10 DIAGNOSIS — K769 Liver disease, unspecified: Secondary | ICD-10-CM | POA: Diagnosis not present

## 2020-12-10 DIAGNOSIS — E1169 Type 2 diabetes mellitus with other specified complication: Secondary | ICD-10-CM | POA: Diagnosis not present

## 2020-12-10 DIAGNOSIS — R0781 Pleurodynia: Secondary | ICD-10-CM

## 2020-12-10 DIAGNOSIS — Z91013 Allergy to seafood: Secondary | ICD-10-CM

## 2020-12-10 DIAGNOSIS — Z79899 Other long term (current) drug therapy: Secondary | ICD-10-CM

## 2020-12-10 DIAGNOSIS — R112 Nausea with vomiting, unspecified: Secondary | ICD-10-CM | POA: Diagnosis present

## 2020-12-10 DIAGNOSIS — L0291 Cutaneous abscess, unspecified: Secondary | ICD-10-CM

## 2020-12-10 DIAGNOSIS — Z992 Dependence on renal dialysis: Secondary | ICD-10-CM | POA: Diagnosis not present

## 2020-12-10 LAB — CBC WITH DIFFERENTIAL/PLATELET
Abs Immature Granulocytes: 0.11 10*3/uL — ABNORMAL HIGH (ref 0.00–0.07)
Basophils Absolute: 0.1 10*3/uL (ref 0.0–0.1)
Basophils Relative: 0 %
Eosinophils Absolute: 0.2 10*3/uL (ref 0.0–0.5)
Eosinophils Relative: 1 %
HCT: 27.8 % — ABNORMAL LOW (ref 39.0–52.0)
Hemoglobin: 9 g/dL — ABNORMAL LOW (ref 13.0–17.0)
Immature Granulocytes: 1 %
Lymphocytes Relative: 4 %
Lymphs Abs: 0.6 10*3/uL — ABNORMAL LOW (ref 0.7–4.0)
MCH: 30.8 pg (ref 26.0–34.0)
MCHC: 32.4 g/dL (ref 30.0–36.0)
MCV: 95.2 fL (ref 80.0–100.0)
Monocytes Absolute: 0.6 10*3/uL (ref 0.1–1.0)
Monocytes Relative: 4 %
Neutro Abs: 15 10*3/uL — ABNORMAL HIGH (ref 1.7–7.7)
Neutrophils Relative %: 90 %
Platelets: 259 10*3/uL (ref 150–400)
RBC: 2.92 MIL/uL — ABNORMAL LOW (ref 4.22–5.81)
RDW: 14.3 % (ref 11.5–15.5)
WBC: 16.7 10*3/uL — ABNORMAL HIGH (ref 4.0–10.5)
nRBC: 0 % (ref 0.0–0.2)

## 2020-12-10 LAB — COMPREHENSIVE METABOLIC PANEL
ALT: 81 U/L — ABNORMAL HIGH (ref 0–44)
AST: 79 U/L — ABNORMAL HIGH (ref 15–41)
Albumin: 2.5 g/dL — ABNORMAL LOW (ref 3.5–5.0)
Alkaline Phosphatase: 140 U/L — ABNORMAL HIGH (ref 38–126)
Anion gap: 13 (ref 5–15)
BUN: 18 mg/dL (ref 8–23)
CO2: 32 mmol/L (ref 22–32)
Calcium: 8.7 mg/dL — ABNORMAL LOW (ref 8.9–10.3)
Chloride: 89 mmol/L — ABNORMAL LOW (ref 98–111)
Creatinine, Ser: 4.09 mg/dL — ABNORMAL HIGH (ref 0.61–1.24)
GFR, Estimated: 15 mL/min — ABNORMAL LOW (ref >60)
Glucose, Bld: 319 mg/dL — ABNORMAL HIGH (ref 70–99)
Potassium: 3.6 mmol/L (ref 3.5–5.1)
Sodium: 134 mmol/L — ABNORMAL LOW (ref 135–145)
Total Bilirubin: 0.8 mg/dL (ref 0.3–1.2)
Total Protein: 7.5 g/dL (ref 6.5–8.1)

## 2020-12-10 LAB — LACTIC ACID, PLASMA: Lactic Acid, Venous: 1.6 mmol/L (ref 0.5–1.9)

## 2020-12-10 LAB — LIPASE, BLOOD: Lipase: 148 U/L — ABNORMAL HIGH (ref 11–51)

## 2020-12-10 LAB — MAGNESIUM: Magnesium: 2.1 mg/dL (ref 1.7–2.4)

## 2020-12-10 MED ORDER — SODIUM CHLORIDE 0.9 % IV BOLUS
500.0000 mL | Freq: Once | INTRAVENOUS | Status: AC
  Administered 2020-12-10: 500 mL via INTRAVENOUS

## 2020-12-10 MED ORDER — METRONIDAZOLE 500 MG/100ML IV SOLN
500.0000 mg | Freq: Once | INTRAVENOUS | Status: AC
  Administered 2020-12-10: 500 mg via INTRAVENOUS
  Filled 2020-12-10: qty 100

## 2020-12-10 MED ORDER — ONDANSETRON HCL 4 MG/2ML IJ SOLN
4.0000 mg | Freq: Once | INTRAMUSCULAR | Status: AC
  Administered 2020-12-10: 4 mg via INTRAVENOUS
  Filled 2020-12-10: qty 2

## 2020-12-10 MED ORDER — VANCOMYCIN HCL 1750 MG/350ML IV SOLN
1750.0000 mg | Freq: Once | INTRAVENOUS | Status: AC
  Administered 2020-12-11: 1750 mg via INTRAVENOUS
  Filled 2020-12-10: qty 350

## 2020-12-10 MED ORDER — SODIUM CHLORIDE 0.9 % IV SOLN: 1.0000 g | INTRAVENOUS | Status: DC

## 2020-12-10 MED ORDER — IOHEXOL 300 MG/ML  SOLN
100.0000 mL | Freq: Once | INTRAMUSCULAR | Status: AC | PRN
  Administered 2020-12-10: 100 mL via INTRAVENOUS

## 2020-12-10 MED ORDER — SODIUM CHLORIDE 0.9 % IV SOLN: 2.0000 g | INTRAVENOUS | Status: DC

## 2020-12-10 MED ORDER — VANCOMYCIN HCL IN DEXTROSE 1-5 GM/200ML-% IV SOLN
1000.0000 mg | INTRAVENOUS | Status: DC
  Filled 2020-12-10: qty 200

## 2020-12-10 MED ORDER — PIPERACILLIN-TAZOBACTAM IN DEX 2-0.25 GM/50ML IV SOLN
2.2500 g | Freq: Three times a day (TID) | INTRAVENOUS | Status: DC
  Administered 2020-12-11: 2.25 g via INTRAVENOUS
  Filled 2020-12-10 (×6): qty 50

## 2020-12-10 MED ORDER — METRONIDAZOLE 500 MG/100ML IV SOLN
INTRAVENOUS | Status: AC
  Filled 2020-12-10: qty 100

## 2020-12-10 MED ORDER — SODIUM CHLORIDE 0.9 % IV SOLN
2.0000 g | Freq: Once | INTRAVENOUS | Status: AC
  Administered 2020-12-10: 2 g via INTRAVENOUS
  Filled 2020-12-10: qty 2

## 2020-12-10 NOTE — ED Notes (Signed)
Lab at bedside

## 2020-12-10 NOTE — H&P (Addendum)
History and Physical    [name] LZJ:[id number] DOB: [birth year] DOA: 12/10/2020  PCP: [facility]   Patient coming from: Home  I have personally briefly reviewed patient's old medical records in [place]  Chief Complaint: Nausea, vomiting  HPI: [name] is a 70 y.o. male with medical history significant for diabetes mellitus, ESRD, hypertension, diastolic CHF, CABG. Patient presented to the ED with complaints of abdominal pain and vomiting.  Patient reports he has been having abdominal pain and chills for about a month.  Abdominal pain is mostly mid abdomen.  He reports onset of vomiting today, 3 times after he completed his session of dialysis today.  No change in bowel habits.  No cough no difficulty breathing.  Recent hospitalization 6/5- 6/6 for ataxia, CT and MRI negative for acute intercranial abnormality and ischemia, 2 suspected that patient had vestibular abnormality.  Patient tells me he is still unstable on his feet.  ED Course: Temperature 98.5 heart rate 91.  Respiratory 18, blood pressure 138/71.  O2 sat 95% on room air. WBC 16.7.  Lipase 148.  Abdominal CT with contrast showed numerous irregular low-density lesions in the liver largest in the posterior right hepatic lobe of 7.4cm.  Differentials necrotic metastases or abscesses. IV cefepime and metronidazole started.  EDP talked with Dr. Laural Golden GI on-call, recommended patient will need IR drainage.  Hospitalist to admit.  Review of Systems: As per HPI all other systems reviewed and negative.  Past Medical History:  Diagnosis Date   Arthritis    CAD (coronary artery disease)    STEMI with LAD stent by [facility] in 2005   Dependence on renal dialysis ([place])    Depression    Diabetes mellitus without complication ([place])    History of kidney stones    Hypertension    Kidney infection    MI (mitral incompetence)    Myocardial infarction ([place])    Seasonal allergies      Past Surgical History:  Procedure Laterality Date   AV FISTULA PLACEMENT Left 01/30/2014   Procedure: INSERTION OF ARTERIOVENOUS (AV) GORE-TEX GRAFT LEFT UPPER ARM;  Surgeon: Elam Dutch, MD;  Location: [facility];  Service: Vascular;  Laterality: Left;   BACK SURGERY     CARDIAC CATHETERIZATION N/A 04/01/2015   Procedure: Right/Left Heart Cath and Coronary Angiography;  Surgeon: [place]ie Man, MD;  Location: [facility];  Service: Cardiovascular;  Laterality: N/A;   COLONOSCOPY N/A 10/12/2016   Procedure: COLONOSCOPY;  Surgeon: Daneil Dolin, MD;  [facility];  Service: Endoscopy;  Laterality: N/A;  2:00pm   COLONOSCOPY W/ POLYPECTOMY     CORONARY ANGIOPLASTY WITH STENT PLACEMENT  2008   CORONARY ARTERY BYPASS GRAFT N/A 04/03/2015   Procedure: CORONARY ARTERY BYPASS GRAFTING (CABG) x  4 (LIMA to LAD, SVG to DIAGONAL, SVG to OM1, and SVG to RCA) with EVH from right greater saphenous thigh and partial lower leg vein ;  Surgeon: Ivin Poot, MD;  Location: [facility];  Service: Open Heart Surgery;  Laterality: N/A;   EYE SURGERY Right    lens implant   INSERTION OF DIALYSIS CATHETER Right 01/30/2014   Procedure: INSERTION OF DIALYSIS CATHETER-RIGHT INTERNAL JUGULAR;  Surgeon: Elam Dutch, MD;  Location: [facility];  Service: Vascular;  Laterality: Right;   POLYPECTOMY  10/12/2016   Procedure: POLYPECTOMY;  Surgeon: Daneil Dolin, MD;  [facility];  Service: Endoscopy;;  colon   SPINE  SURGERY     TEE WITHOUT CARDIOVERSION N/A 04/03/2015   Procedure: TRANSESOPHAGEAL ECHOCARDIOGRAM (TEE);  Surgeon: Ivin Poot, MD;  Location: [facility];  Service: Open Heart Surgery;  Laterality: N/A;     reports that he has never smoked. He quit smokeless tobacco use about 10 years ago.  His smokeless tobacco use included chew. He reports that he does not drink alcohol and does not use drugs.  Allergies  Allergen Reactions   Shellfish Allergy Hives    Family History   Problem Relation Age of Onset   Diabetes Mother    Hypertension Mother    Diabetes Father    Hypertension Father    Diabetes Other     Prior to Admission medications   Medication Sig Start Date End Date Taking? Authorizing Provider  acetaminophen (TYLENOL) 500 MG tablet Take 500 mg by mouth every 6 (six) hours as needed for mild pain or moderate pain.    [provider]  albuterol (PROVENTIL HFA;VENTOLIN HFA) 108 (90 Base) MCG/ACT inhaler Inhale 2 puffs into the lungs every 6 (six) hours as needed. Patient not taking: No sig reported 07/29/18   Rancour, Annie Main, MD  amLODipine (NORVASC) 10 MG tablet Take 1 tablet (10 mg total) by mouth daily. 11/17/20   Barton Dubois, MD  aspirin EC 325 MG tablet Take 325mg s once daily on non dialysis days. Dialysis days are Mon, Wed, Fri Patient not taking: Reported on 12/04/2020    [provider]  atorvastatin (LIPITOR) 80 MG tablet Take 1 tablet (80 mg total) by mouth daily at 6 PM. Patient not taking: No sig reported 04/10/15   Nani Skillern, PA-C  carvedilol (COREG) 25 MG tablet Take 1 tablet (25 mg total) by mouth 2 (two) times daily. 11/17/20   Barton Dubois, MD  cloNIDine (CATAPRES) 0.2 MG tablet Take 1 tablet (0.2 mg total) by mouth 2 (two) times daily. 11/17/20   Barton Dubois, MD  diphenhydramine-acetaminophen (TYLENOL PM) 25-500 MG TABS tablet Take 1 tablet by mouth at bedtime as needed (sleep).    [provider]  insulin glargine (LANTUS) 100 UNIT/ML injection Inject 10-20 Units into the skin at bedtime.    [provider]  latanoprost (XALATAN) 0.005 % ophthalmic solution Place 2 drops into both eyes 2 (two) times daily. 08/27/16   [provider]  magnesium hydroxide (MILK OF MAGNESIA) 400 MG/5ML suspension Take 15 mLs by mouth daily as needed for mild constipation.    [provider]  meclizine (ANTIVERT) 25 MG tablet Take 1 tablet (25 mg total) by mouth 3 (three) times daily as  needed for dizziness. 11/17/20   Barton Dubois, MD  minoxidil (LONITEN) 10 MG tablet Take 0.5 tablets (5 mg total) by mouth at bedtime. Patient does not take as prescribed. Sometimes takes a 1/2 tablet depending on BP readings Patient not taking: Reported on 12/04/2020 11/17/20   Barton Dubois, MD  Multiple Vitamin (DAILY VITAMIN PO) Take 1 tablet by mouth daily as needed (supplement).    [provider]  NITROSTAT 0.4 MG SL tablet Place 0.4 mg under the tongue every 5 (five) minutes as needed for chest pain.  01/23/15   [provider]  Omega-3 Fatty Acids (FISH OIL) 1000 MG CAPS Take 1,000 mg by mouth daily.    [provider]  polyethylene glycol-electrolytes (TRILYTE) 420 g solution Take 4,000 mLs by mouth as directed. Patient not taking: No sig reported 09/14/16   Rourk, Cristopher Estimable, MD  zolpidem [facility]) 10  MG tablet Take 10 mg by mouth at bedtime as needed for sleep.  05/29/16   [provider]    Physical Exam: Vitals:   12/10/20 1747 12/10/20 1748  BP: 138/71   Pulse: 91   Resp: 18   Temp: 98.5 F (36.9 C)   TempSrc: Oral   SpO2: 95%   Weight:  81.6 kg  Height:  6\' 5"  (1.956 m)    Constitutional: NAD, calm, comfortable Vitals:   12/10/20 1747 12/10/20 1748  BP: 138/71   Pulse: 91   Resp: 18   Temp: 98.5 F (36.9 C)   TempSrc: Oral   SpO2: 95%   Weight:  81.6 kg  Height:  6\' 5"  (1.956 m)   Eyes: PERRL, lids and conjunctivae normal ENMT: Mucous membranes are moist.  Neck: normal, supple, no masses, no thyromegaly Respiratory: clear to auscultation bilaterally, no wheezing, no crackles. Normal respiratory effort. No accessory muscle use.  Cardiovascular: Regular rate and rhythm,  No extremity edema. 2+ pedal pulses. No carotid bruits.  Abdomen: Mild periumbilical tenderness, no masses palpated. No hepatosplenomegaly. Bowel sounds positive.  Musculoskeletal: no clubbing / cyanosis. No joint deformity upper and lower extremities. Good ROM, no  contractures. Normal muscle tone.  Skin: HD access left upper extremity, without erythema or appreciable abnormality, no rashes, lesions, ulcers. Neurologic: No apparent cranial abnormality, moving all extremities spontaneously Psychiatric: Normal judgment and insight. Alert and oriented x 3. Normal mood.   Labs on Admission: I have personally reviewed following labs and imaging studies  CBC: Recent Labs  Lab 12/04/20 1334 12/10/20 1815  WBC 11.5* 16.7*  NEUTROABS 9.5* 15.0*  HGB 7.3* 9.0*  HCT 22.1* 27.8*  MCV 100.0 95.2  PLT 276 469   Basic Metabolic Panel: Recent Labs  Lab 12/04/20 1334 12/10/20 1815  NA 129* 134*  K 3.6 3.6  CL 87* 89*  CO2 29 32  GLUCOSE 341* 319*  BUN 29* 18  CREATININE 6.82* 4.09*  CALCIUM 8.4* 8.7*  MG 2.0  --    Liver Function Tests: Recent Labs  Lab 12/04/20 1334 12/10/20 1815  AST 102* 79*  ALT 92* 81*  ALKPHOS 87 140*  BILITOT 0.7 0.8  PROT 6.7 7.5  ALBUMIN 2.4* 2.5*   Recent Labs  Lab 12/10/20 1815  LIPASE 148*    Radiological Exams on Admission: CT ABDOMEN PELVIS W CONTRAST  Result Date: 12/10/2020 CLINICAL DATA:  Abdominal pain, fever, vomiting EXAM: CT ABDOMEN AND PELVIS WITH CONTRAST TECHNIQUE: Multidetector CT imaging of the abdomen and pelvis was performed using the standard protocol following bolus administration of intravenous contrast. CONTRAST:  151mL OMNIPAQUE IOHEXOL 300 MG/ML  SOLN COMPARISON:  None. FINDINGS: Lower chest: Small right pleural effusion. Atelectasis in the lower lobes. Diffuse coronary artery calcifications. Scattered aortic calcifications. Hepatobiliary: Numerous irregular low-density masses throughout the liver. Largest is in the posterior right hepatic lobe measuring up to 7.4 cm. Largest area in the left hepatic lobe measures up to 5 cm. Other smaller low-density irregular lesions throughout the liver. These could reflect necrotic masses or abscesses. Gallbladder unremarkable. No biliary ductal  dilatation. Pancreas: No focal abnormality or ductal dilatation. Spleen: No focal abnormality.  Normal size. Adrenals/Urinary Tract: Numerous bilateral renal cysts. Kidneys are atrophic. No hydronephrosis. Adrenal glands and urinary bladder unremarkable. Stomach/Bowel: Normal appendix. Stomach, large and small bowel grossly unremarkable. Vascular/Lymphatic: Heavily calcified aorta, iliac vessels and branch vessels. No evidence of aneurysm or adenopathy. Reproductive: Prostate enlargement. Other: No free fluid or free air. Musculoskeletal: No  acute bony abnormality. IMPRESSION: Numerous irregular low-density lesions throughout the liver, the largest in the posterior right hepatic lobe measuring 7.4 cm. These could reflect necrotic metastases or abscesses. Given the patient's history and appearance, abscesses are favored. Aortoiliac atherosclerosis. Numerous low-density lesions throughout the kidneys, likely cysts. Prostate enlargement. These results were called by telephone at the time of interpretation on 12/10/2020 at 7:48 pm to provider Dr. Roderic Palau, who verbally acknowledged these results. Electronically Signed   By: Rolm Baptise M.D.   On: 12/10/2020 19:50    EKG: Independently reviewed.  Sinus rhythm rate 92.  QTc 544.  Old RBBB.  No change from prior.  Assessment/Plan Principal Problem:   Liver lesion Active Problems:   End stage renal disease (HCC)   S/P CABG (coronary artery bypass graft)   DM (diabetes mellitus) (H[facility])    Liver lesion-abdominal pain, vomiting, leukocytosis 16.7.  Not meeting sepsis criteria.  Abdominal CT shows numerous irregular low-density lesions throughout the liver, largest 7.4 cm.  Differentials necrotic metastasis or abscesses, abscess is favored.  Mild liver enzyme elevations, AST 79, ALT 81, ALP 140.  Normal T bili 0.8. - EDP talked to GI, patient will need IR evaluation - [facility] to [facility] -Follow-up blood cultures obtained in the ED -Will start broad-spectrum  antibiotics-IV Vanco and Zosyn, as patient is dialysis patient, GI and skin flora possible etiology -IR evaluation, ultrasound-guided biopsy, culture, gram stain, surgical pathology of aspirate -Please consult GI in AM.  At some point may need ID evaluation also. - PRN Phenegan -N.p.o. midnight - 559ml bolus given, holding off on further fluids for now  Prolonged QTC - 544.  Baseline prolonged QTC - Check Mag  ESRD-schedule Monday Wednesday Friday.  Last HD was today. - Please consult nephrology in the morning  CABG-stable.  EKG shows old RBBB. -Hold aspirin, statins  Diabetes mellitus-random glucose 319 - SSI- S -Hold home Lantus while n.p.o.  HTN-stable. -Resume Norvasc, carvedilol, clonidine and minoxidil, patient reports compliance with all of these.  DVT prophylaxis: SCDS Code Status:  Full code Family Communication: None at bedside Disposition Plan: > 2 days Consults called: Pls consult GI, Nephrology in a.m Admission status: Inpt, tele  I certify that at the point of admission it is my clinical judgment that the patient will require inpatient hospital care spanning beyond 2 midnights from the point of admission due to high intensity of service, high risk for further deterioration and high frequency of surveillance required. The following factors support the patient status of inpatient:    Bethena Roys MD [facility]  12/10/2020, 10:08 PM

## 2020-12-10 NOTE — Progress Notes (Signed)
Pharmacy Antibiotic Note  Nayel Purdy is a 70 y.o. male admitted on 12/10/2020 with  multiple liver abscesses vs necrotic metastasis .  Pharmacy has been consulted for Vancomycin/Zosyn dosing. WBC is rising. Pt has ESRD on HD MWF.   Plan: Vancomycin 1750 mg IV x 1, then 1000 mg IV qHD MWF Zosyn 2.25g q8h Trend WBC, temp, HD schedule F/U infectious work-up Drug levels as indicated   Height: 6\' 5"  (195.6 cm) Weight: 81.6 kg (180 lb) IBW/kg (Calculated) : 89.1  Temp (24hrs), Avg:98.5 F (36.9 C), Min:98.5 F (36.9 C), Max:98.5 F (36.9 C)  Recent Labs  Lab 12/04/20 1334 12/10/20 1815 12/10/20 2045  WBC 11.5* 16.7*  --   CREATININE 6.82* 4.09*  --   LATICACIDVEN  --   --  1.6    Estimated Creatinine Clearance: 19.7 mL/min (A) (by C-G formula based on SCr of 4.09 mg/dL (H)).    Allergies  Allergen Reactions   Shellfish Allergy Hives   Narda Bonds, PharmD, BCPS Clinical Pharmacist Phone: [phone]

## 2020-12-10 NOTE — ED Notes (Signed)
Lab able to get 1 set of cultures. This nurse attempted to obtain a second line without success.

## 2020-12-10 NOTE — ED Triage Notes (Signed)
Pt to er, pt states that he had dialysis today, states that he is here for abd pain and vomiting all day today, states that the pain started after dialysis today.

## 2020-12-10 NOTE — ED Notes (Signed)
Pt. Provided with water.

## 2020-12-10 NOTE — ED Provider Notes (Signed)
[facility] EMERGENCY DEPARTMENT Provider Note   CSN: [id number] Arrival date & time: 12/10/20  1731     History Chief Complaint  Patient presents with   Abdominal Pain    [name] is a 70 y.o. male.  HPI  Patient is a 70 year old male with a history of arthritis, CAD, dependence on dialysis, diabetes, nephrolithiasis, hypertension, MI, who presents to the emergency department today for evaluation of nausea vomiting and diarrhea that started today.  He also reports some chills.  He has no significant abdominal pain.  He denies eating any recent suspect foods.  He denies any cough, congestion or shortness of breath.  Denies any chest pain.  He denies any sick contacts with similar symptoms.  Past Medical History:  Diagnosis Date   Arthritis    CAD (coronary artery disease)    STEMI with LAD stent by C Granger in 2005   Dependence on renal dialysis [facility])    Depression    Diabetes mellitus without complication ([place])    History of kidney stones    Hypertension    Kidney infection    MI (mitral incompetence)    Myocardial infarction ([place])    Seasonal allergies     Patient Active Problem List   Diagnosis Date Noted   Liver lesion 12/10/2020   Hypo-osmolality and hyponatremia 11/28/2020   Unsteady gait    Ataxia 11/16/2020   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   NPDR (nonproliferative diabetic retinopathy) ([place]) 11/21/2016   Pseudophakia of both eyes 11/21/2016   History of adenomatous polyp of colon 09/14/2016   Coronary artery disease involving native coronary artery of native heart without angina pectoris 12/05/2015   S/P CABG (coronary artery bypass graft) 12/05/2015   Secondary hypertension, unspecified    Acute diastolic heart failure (HCC)    Volume overload 03/31/2015   NSTEMI (non-ST elevated myocardial infarction) ([place]) 03/31/2015   Hypoxia 03/21/2015   Fluid overload, unspecified 08/21/2014   End stage renal disease ([place]) 03/07/2014   Encounter for  screening for respiratory tuberculosis 02/08/2014   Shortness of breath 02/05/2014   ESRD (end stage renal disease) ([place]) 02/05/2014   Aftercare including intermittent dialysis ([place]) 02/05/2014   Coagulation defect, unspecified ([place]) 02/05/2014   Disorder of phosphorus metabolism, unspecified 02/05/2014   Hypoglycemia, unspecified 02/05/2014   Iron deficiency anemia, unspecified 02/05/2014   Moderate protein-calorie malnutrition ([place]) 02/05/2014   Other disorders of plasma-protein metabolism, not elsewhere classified 02/05/2014   Pain, unspecified 02/05/2014   Pruritus, unspecified 02/05/2014   Secondary hyperparathyroidism of renal origin ([place]) 02/05/2014   Thrombocytopenia, unspecified ([place]) 02/05/2014   Chronic kidney disease 01/29/2014   Acute on chronic renal failure ([place]) 01/14/2014   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   Hyponatremia 01/14/2014   Hyperkalemia 01/14/2014   Type 2 diabetes mellitus without complications ([place]) 40/98/1191   HLD (hyperlipidemia) 09/02/2009   Essential (primary) hypertension 09/02/2009   Coronary atherosclerosis 09/02/2009    Past Surgical History:  Procedure Laterality Date   AV FISTULA PLACEMENT Left 01/30/2014   Procedure: INSERTION OF ARTERIOVENOUS (AV) GORE-TEX GRAFT LEFT UPPER ARM;  Surgeon: Elam Dutch, MD;  Location: [place];  Service: Vascular;  Laterality: Left;   BACK SURGERY     CARDIAC CATHETERIZATION N/A 04/01/2015   Procedure: Right/Left Heart Cath and Coronary Angiography;  Surgeon: Leonie Man, MD;  Location: [place] CV [facility];  Service: Cardiovascular;  Laterality: N/A;   COLONOSCOPY N/A 10/12/2016   Procedure: COLONOSCOPY;  Surgeon: Daneil Dolin, MD;  [facility];  Service: Endoscopy;  Laterality: N/A;  2:00pm   COLONOSCOPY W/ POLYPECTOMY     CORONARY ANGIOPLASTY WITH STENT PLACEMENT  2008   CORONARY ARTERY BYPASS GRAFT N/A 04/03/2015   Procedure: CORONARY ARTERY BYPASS GRAFTING (CABG) x  4 (LIMA to  LAD, SVG to DIAGONAL, SVG to OM1, and SVG to RCA) with EVH from right greater saphenous thigh and partial lower leg vein ;  Surgeon: Ivin Poot, MD;  Location: [place];  Service: Open Heart Surgery;  Laterality: N/A;   EYE SURGERY Right    lens implant   INSERTION OF DIALYSIS CATHETER Right 01/30/2014   Procedure: INSERTION OF DIALYSIS CATHETER-RIGHT INTERNAL JUGULAR;  Surgeon: Elam Dutch, MD;  Location: [place];  Service: Vascular;  Laterality: Right;   POLYPECTOMY  10/12/2016   Procedure: POLYPECTOMY;  Surgeon: Daneil Dolin, MD;  [facility];  Service: Endoscopy;;  colon   SPINE SURGERY     TEE WITHOUT CARDIOVERSION N/A 04/03/2015   Procedure: TRANSESOPHAGEAL ECHOCARDIOGRAM (TEE);  Surgeon: Ivin Poot, MD;  Location: [place];  Service: Open Heart Surgery;  Laterality: N/A;       Family History  Problem Relation Age of Onset   Diabetes Mother    Hypertension Mother    Diabetes Father    Hypertension Father    Diabetes Other     Social History   Tobacco Use   Smoking status: Never   Smokeless tobacco: Former    Types: Chew    Quit date: 06/14/2010  Vaping Use   Vaping Use: Never used  Substance Use Topics   Alcohol use: No   Drug use: No    Home Medications Prior to Admission medications   Medication Sig Start Date End Date Taking? Authorizing Provider  acetaminophen (TYLENOL) 500 MG tablet Take 500 mg by mouth every 6 (six) hours as needed for mild pain or moderate pain.    [provider]  albuterol (PROVENTIL HFA;VENTOLIN HFA) 108 (90 Base) MCG/ACT inhaler Inhale 2 puffs into the lungs every 6 (six) hours as needed. Patient not taking: No sig reported 07/29/18   Rancour, Annie Main, MD  amLODipine (NORVASC) 10 MG tablet Take 1 tablet (10 mg total) by mouth daily. 11/17/20   Barton Dubois, MD  aspirin EC 325 MG tablet Take 325mg s once daily on non dialysis days. Dialysis days are Mon, Wed, Fri Patient not taking: Reported on 12/04/2020    [provider]  atorvastatin (LIPITOR) 80 MG tablet Take 1 tablet (80 mg total) by mouth daily at 6 PM. Patient not taking: No sig reported 04/10/15   Nani Skillern, PA-C  carvedilol (COREG) 25 MG tablet Take 1 tablet (25 mg total) by mouth 2 (two) times daily. 11/17/20   Barton Dubois, MD  cloNIDine (CATAPRES) 0.2 MG tablet Take 1 tablet (0.2 mg total) by mouth 2 (two) times daily. 11/17/20   Barton Dubois, MD  diphenhydramine-acetaminophen (TYLENOL PM) 25-500 MG TABS tablet Take 1 tablet by mouth at bedtime as needed (sleep).    [provider]  insulin glargine (LANTUS) 100 UNIT/ML injection Inject 10-20 Units into the skin at bedtime.    [provider]  latanoprost (XALATAN) 0.005 % ophthalmic solution Place 2 drops into both eyes 2 (two) times daily. 08/27/16   [provider]  magnesium hydroxide (MILK OF MAGNESIA) 400 MG/5ML suspension Take 15 mLs by mouth daily as needed for mild constipation.    [provider]  meclizine (  ANTIVERT) 25 MG tablet Take 1 tablet (25 mg total) by mouth 3 (three) times daily as needed for dizziness. 11/17/20   Barton Dubois, MD  minoxidil (LONITEN) 10 MG tablet Take 0.5 tablets (5 mg total) by mouth at bedtime. Patient does not take as prescribed. Sometimes takes a 1/2 tablet depending on BP readings Patient not taking: Reported on 12/04/2020 11/17/20   Barton Dubois, MD  Multiple Vitamin (DAILY VITAMIN PO) Take 1 tablet by mouth daily as needed (supplement).    [provider]  NITROSTAT 0.4 MG SL tablet Place 0.4 mg under the tongue every 5 (five) minutes as needed for chest pain.  01/23/15   [provider]  Omega-3 Fatty Acids (FISH OIL) 1000 MG CAPS Take 1,000 mg by mouth daily.    [provider]  polyethylene glycol-electrolytes (TRILYTE) 420 g solution Take 4,000 mLs by mouth as directed. Patient not taking: No sig reported 09/14/16   Daneil Dolin, MD  zolpidem (AMBIEN) 10 MG tablet Take  10 mg by mouth at bedtime as needed for sleep.  05/29/16   [provider]    Allergies    Shellfish allergy  Review of Systems   Review of Systems  Constitutional:  Positive for chills and diaphoresis. Negative for fever.  HENT:  Negative for ear pain and sore throat.   Eyes:  Negative for pain and visual disturbance.  Respiratory:  Negative for cough and shortness of breath.   Cardiovascular:  Negative for chest pain.  Gastrointestinal:  Positive for diarrhea, nausea and vomiting. Negative for abdominal pain.  Genitourinary:  Negative for dysuria and hematuria.  Musculoskeletal:  Negative for arthralgias and back pain.  Skin:  Negative for color change and rash.  Neurological:  Negative for headaches.  All other systems reviewed and are negative.  Physical Exam Updated Vital Signs BP 138/71 (BP Location: Right Arm)   Pulse 91   Temp 98.5 F (36.9 C) (Oral)   Resp 18   Ht 6\' 5"  (1.956 m)   Wt 81.6 kg   SpO2 95%   BMI 21.34 kg/m   Physical Exam Vitals and nursing note reviewed.  Constitutional:      Appearance: He is well-developed.  HENT:     Head: Normocephalic and atraumatic.  Eyes:     Conjunctiva/sclera: Conjunctivae normal.  Cardiovascular:     Rate and Rhythm: Normal rate and regular rhythm.     Heart sounds: Normal heart sounds. No murmur heard. Pulmonary:     Effort: Pulmonary effort is normal. No respiratory distress.     Breath sounds: Normal breath sounds. No wheezing, rhonchi or rales.  Abdominal:     General: Bowel sounds are normal.     Palpations: Abdomen is soft.     Tenderness: There is no abdominal tenderness. There is no guarding or rebound.  Musculoskeletal:     Cervical back: Neck supple.  Skin:    General: Skin is warm and dry.  Neurological:     Mental Status: He is alert.    ED Results / Procedures / Treatments   Labs (all labs ordered are listed, but only abnormal results are displayed) Labs Reviewed  CBC WITH  DIFFERENTIAL/PLATELET - Abnormal; Notable for the following components:      Result Value   WBC 16.7 (*)    RBC 2.92 (*)    Hemoglobin 9.0 (*)    HCT 27.8 (*)    Neutro Abs 15.0 (*)    Lymphs Abs 0.6 (*)  Abs Immature Granulocytes 0.11 (*)    All other components within normal limits  COMPREHENSIVE METABOLIC PANEL - Abnormal; Notable for the following components:   Sodium 134 (*)    Chloride 89 (*)    Glucose, Bld 319 (*)    Creatinine, Ser 4.09 (*)    Calcium 8.7 (*)    Albumin 2.5 (*)    AST 79 (*)    ALT 81 (*)    Alkaline Phosphatase 140 (*)    GFR, Estimated 15 (*)    All other components within normal limits  LIPASE, BLOOD - Abnormal; Notable for the following components:   Lipase 148 (*)    All other components within normal limits  CULTURE, BLOOD (ROUTINE X 2)  CULTURE, BLOOD (ROUTINE X 2)  SARS CORONAVIRUS 2 (TAT 6-24 HRS)  LACTIC ACID, PLASMA  LACTIC ACID, PLASMA    EKG None  Radiology CT ABDOMEN PELVIS W CONTRAST  Result Date: 12/10/2020 CLINICAL DATA:  Abdominal pain, fever, vomiting EXAM: CT ABDOMEN AND PELVIS WITH CONTRAST TECHNIQUE: Multidetector CT imaging of the abdomen and pelvis was performed using the standard protocol following bolus administration of intravenous contrast. CONTRAST:  122mL OMNIPAQUE IOHEXOL 300 MG/ML  SOLN COMPARISON:  None. FINDINGS: Lower chest: Small right pleural effusion. Atelectasis in the lower lobes. Diffuse coronary artery calcifications. Scattered aortic calcifications. Hepatobiliary: Numerous irregular low-density masses throughout the liver. Largest is in the posterior right hepatic lobe measuring up to 7.4 cm. Largest area in the left hepatic lobe measures up to 5 cm. Other smaller low-density irregular lesions throughout the liver. These could reflect necrotic masses or abscesses. Gallbladder unremarkable. No biliary ductal dilatation. Pancreas: No focal abnormality or ductal dilatation. Spleen: No focal abnormality.  Normal  size. Adrenals/Urinary Tract: Numerous bilateral renal cysts. Kidneys are atrophic. No hydronephrosis. Adrenal glands and urinary bladder unremarkable. Stomach/Bowel: Normal appendix. Stomach, large and small bowel grossly unremarkable. Vascular/Lymphatic: Heavily calcified aorta, iliac vessels and branch vessels. No evidence of aneurysm or adenopathy. Reproductive: Prostate enlargement. Other: No free fluid or free air. Musculoskeletal: No acute bony abnormality. IMPRESSION: Numerous irregular low-density lesions throughout the liver, the largest in the posterior right hepatic lobe measuring 7.4 cm. These could reflect necrotic metastases or abscesses. Given the patient's history and appearance, abscesses are favored. Aortoiliac atherosclerosis. Numerous low-density lesions throughout the kidneys, likely cysts. Prostate enlargement. These results were called by telephone at the time of interpretation on 12/10/2020 at 7:48 pm to provider Dr. Roderic Palau, who verbally acknowledged these results. Electronically Signed   By: Rolm Baptise M.D.   On: 12/10/2020 19:50    Procedures Procedures   Medications Ordered in ED Medications  ceFEPIme (MAXIPIME) 2 g in sodium chloride 0.9 % 100 mL IVPB (has no administration in time range)  metroNIDAZOLE (FLAGYL) IVPB 500 mg (has no administration in time range)  ceFEPIme (MAXIPIME) 1 g in sodium chloride 0.9 % 100 mL IVPB (has no administration in time range)  sodium chloride 0.9 % bolus 500 mL (500 mLs Intravenous New Bag/Given 12/10/20 1914)  ondansetron (ZOFRAN) injection 4 mg (4 mg Intravenous Given 12/10/20 1913)  iohexol (OMNIPAQUE) 300 MG/ML solution 100 mL (100 mLs Intravenous Contrast Given 12/10/20 1932)    ED Course  I have reviewed the triage vital signs and the nursing notes.  Pertinent labs & imaging results that were available during my care of the patient were reviewed by me and considered in my medical decision making (see chart for details).    MDM  Rules/Calculators/A&P  70 year old male presenting the emergency department today for evaluation of nausea vomiting and diarrhea that started today.  Also reports chills.  Reviewed/interpreted labs CBC showed a leukocytosis of 16,000, anemia present with hemoglobin of 9 which appears baseline CMP with mild hyponatremia and hypochloremia.  Elevated glucose at 319, elevated creatinine consistent with history of ESRD. Lipase is somewhat elevated Blood cultures were obtained Lactic acid pending on admission   EKG with inus or ectopic, rbbb, baseline wander leads v6  Reviewed/interpreted imaging CT abd/pelvis -Numerous irregular low-density lesions throughout the liver, the largest in the posterior right hepatic lobe measuring 7.4 cm. These could reflect necrotic metastases or abscesses. Given the patient's history and appearance, abscesses are favored. Aortoiliac atherosclerosis. Numerous low-density lesions throughout the kidneys, likely cysts. Prostate enlargement.   8:11 PM CONSULT with Dr. Laural Golden who will consult on pt. Recommends abx and IR consult for perc drain.  8:46 PM CONSULT with Dr. Denton Brick who accepts patient for admission   Final Clinical Impression(s) / ED Diagnoses Final diagnoses:  Liver abscess    Rx / DC Orders ED Discharge Orders     None        Bishop Dublin 12/10/20 2116    Milton Ferguson, MD 12/12/20 [phone]

## 2020-12-10 NOTE — Progress Notes (Signed)
Pharmacy Antibiotic Note  [name] a 70 y.o. male admitted on 12/10/2020 with  intra abdominal infetion .  Pharmacy has been consulted for cefepime dosing.  Plan: Cefepime 1gm iv q24h after dialysis  Medical History: Past Medical History:  Diagnosis Date   Arthritis    CAD (coronary artery disease)    STEMI with LAD stent by C Granger in 2005   Dependence on renal dialysis ([place])    Depression    Diabetes mellitus without complication ([place])    History of kidney stones    Hypertension    Kidney infection    MI (mitral incompetence)    Myocardial infarction (H[facility])    Seasonal allergies     Allergies:  Allergies  Allergen Reactions   Shellfish Allergy Hives    Filed Weights   12/10/20 1748  Weight: 81.6 kg (180 lb)    CBC Latest Ref Rng & Units 12/10/2020 12/04/2020 11/17/2020  WBC 4.0 - 10.5 K/uL 16.7(H) 11.5(H) 9.9  Hemoglobin 13.0 - 17.0 g/dL 9.0(L) 7.3(L) 10.3(L)  Hematocrit 39.0 - 52.0 % 27.8(L) 22.1(L) 30.2(L)  Platelets 150 - 400 K/uL 259 276 228     Estimated Creatinine Clearance: 19.7 mL/min (A) (by C-G formula based on SCr of 4.09 mg/dL (H)).  Antibiotics Given (last 72 hours)     None       Antimicrobials this admission:  Cefepime 12/10/20 >>  Metronidazole 12/10/20  x 1   Microbiology results: 12/10/20 BCx: sent  Thank you for allowing pharmacy to be a part of this patient's care.  Thomasenia Sales, PharmD Clinical Pharmacist

## 2020-12-11 ENCOUNTER — Ambulatory Visit (HOSPITAL_COMMUNITY)
Admit: 2020-12-11 | Discharge: 2020-12-11 | Disposition: A | Discharge status: Home / Self Care | Payer: Medicare Other | Attending: Internal Medicine | Admitting: Internal Medicine

## 2020-12-11 LAB — CBC
HCT: 28.9 % — ABNORMAL LOW (ref 39.0–52.0)
Hemoglobin: 9.3 g/dL — ABNORMAL LOW (ref 13.0–17.0)
MCH: 31.1 pg (ref 26.0–34.0)
MCHC: 32.2 g/dL (ref 30.0–36.0)
MCV: 96.7 fL (ref 80.0–100.0)
Platelets: 248 10*3/uL (ref 150–400)
RBC: 2.99 MIL/uL — ABNORMAL LOW (ref 4.22–5.81)
RDW: 14.3 % (ref 11.5–15.5)
WBC: 16.6 10*3/uL — ABNORMAL HIGH (ref 4.0–10.5)
nRBC: 0 % (ref 0.0–0.2)

## 2020-12-11 LAB — COMPREHENSIVE METABOLIC PANEL
ALT: 67 U/L — ABNORMAL HIGH (ref 0–44)
AST: 43 U/L — ABNORMAL HIGH (ref 15–41)
Albumin: 2.4 g/dL — ABNORMAL LOW (ref 3.5–5.0)
Alkaline Phosphatase: 129 U/L — ABNORMAL HIGH (ref 38–126)
Anion gap: 14 (ref 5–15)
BUN: 25 mg/dL — ABNORMAL HIGH (ref 8–23)
CO2: 27 mmol/L (ref 22–32)
Calcium: 8.2 mg/dL — ABNORMAL LOW (ref 8.9–10.3)
Chloride: 89 mmol/L — ABNORMAL LOW (ref 98–111)
Creatinine, Ser: 4.73 mg/dL — ABNORMAL HIGH (ref 0.61–1.24)
GFR, Estimated: 13 mL/min — ABNORMAL LOW (ref >60)
Glucose, Bld: 322 mg/dL — ABNORMAL HIGH (ref 70–99)
Potassium: 3.6 mmol/L (ref 3.5–5.1)
Sodium: 130 mmol/L — ABNORMAL LOW (ref 135–145)
Total Bilirubin: 0.8 mg/dL (ref 0.3–1.2)
Total Protein: 7 g/dL (ref 6.5–8.1)

## 2020-12-11 LAB — PROTIME-INR
INR: 1.4 — ABNORMAL HIGH (ref 0.8–1.2)
Prothrombin Time: 16.7 seconds — ABNORMAL HIGH (ref 11.4–15.2)

## 2020-12-11 LAB — SARS CORONAVIRUS 2 (TAT 6-24 HRS): SARS Coronavirus 2: NEGATIVE

## 2020-12-11 MED ORDER — CARVEDILOL 25 MG PO TABS
25.0000 mg | ORAL_TABLET | Freq: Two times a day (BID) | ORAL | Status: DC
  Administered 2020-12-11 (×2): 25 mg via ORAL
  Filled 2020-12-11: qty 2
  Filled 2020-12-11: qty 1

## 2020-12-11 MED ORDER — AMLODIPINE BESYLATE 10 MG PO TABS: 10.0000 mg | ORAL_TABLET | Freq: Every day | ORAL | Status: DC

## 2020-12-11 MED ORDER — FENTANYL CITRATE (PF) 100 MCG/2ML IJ SOLN
INTRAMUSCULAR | Status: AC
  Filled 2020-12-11: qty 2

## 2020-12-11 MED ORDER — CHLORHEXIDINE GLUCONATE CLOTH 2 % EX PADS
6.0000 | MEDICATED_PAD | Freq: Every day | CUTANEOUS | Status: DC
  Administered 2020-12-13 – 2020-12-17 (×4): 6 via TOPICAL

## 2020-12-11 MED ORDER — PIPERACILLIN-TAZOBACTAM 3.375 G IVPB
3.3750 g | Freq: Two times a day (BID) | INTRAVENOUS | Status: DC
  Administered 2020-12-11 – 2020-12-14 (×6): 3.375 g via INTRAVENOUS
  Filled 2020-12-11 (×6): qty 50

## 2020-12-11 MED ORDER — POLYETHYLENE GLYCOL 3350 17 G PO PACK
17.0000 g | PACK | Freq: Every day | ORAL | Status: DC | PRN
  Administered 2020-12-14 – 2020-12-16 (×2): 17 g via ORAL
  Filled 2020-12-11 (×2): qty 1

## 2020-12-11 MED ORDER — SODIUM CHLORIDE 0.9 % IV SOLN
6.2500 mg | Freq: Three times a day (TID) | INTRAVENOUS | Status: DC | PRN
  Filled 2020-12-11: qty 0.25

## 2020-12-11 MED ORDER — FENTANYL CITRATE (PF) 100 MCG/2ML IJ SOLN
25.0000 ug | INTRAMUSCULAR | Status: DC | PRN
  Administered 2020-12-11 (×3): 25 ug via INTRAVENOUS
  Filled 2020-12-11 (×3): qty 2

## 2020-12-11 MED ORDER — LIDOCAINE HCL 1 % IJ SOLN
INTRAMUSCULAR | Status: AC
  Filled 2020-12-11: qty 20

## 2020-12-11 MED ORDER — ONDANSETRON HCL 4 MG/2ML IJ SOLN: 4.0000 mg | Freq: Four times a day (QID) | INTRAMUSCULAR | Status: DC | PRN

## 2020-12-11 MED ORDER — FENTANYL CITRATE (PF) 100 MCG/2ML IJ SOLN
INTRAMUSCULAR | Status: AC | PRN
  Administered 2020-12-11 (×3): 25 ug via INTRAVENOUS

## 2020-12-11 MED ORDER — MIDAZOLAM HCL 2 MG/2ML IJ SOLN
INTRAMUSCULAR | Status: AC
  Filled 2020-12-11: qty 2

## 2020-12-11 MED ORDER — AMLODIPINE BESYLATE 5 MG PO TABS: 10.0000 mg | ORAL_TABLET | Freq: Every day | ORAL | Status: DC

## 2020-12-11 MED ORDER — ACETAMINOPHEN 650 MG RE SUPP: 650.0000 mg | Freq: Four times a day (QID) | RECTAL | Status: DC | PRN

## 2020-12-11 MED ORDER — ACETAMINOPHEN 325 MG PO TABS
650.0000 mg | ORAL_TABLET | Freq: Four times a day (QID) | ORAL | Status: DC | PRN
  Administered 2020-12-11 – 2020-12-16 (×5): 650 mg via ORAL
  Filled 2020-12-11 (×6): qty 2

## 2020-12-11 MED ORDER — MINOXIDIL 2.5 MG PO TABS
5.0000 mg | ORAL_TABLET | Freq: Every day | ORAL | Status: DC
  Filled 2020-12-11 (×2): qty 2

## 2020-12-11 MED ORDER — OXYCODONE HCL 5 MG PO TABS
5.0000 mg | ORAL_TABLET | ORAL | Status: DC | PRN
  Administered 2020-12-13 – 2020-12-14 (×4): 5 mg via ORAL
  Filled 2020-12-11 (×6): qty 1

## 2020-12-11 MED ORDER — MIDAZOLAM HCL 2 MG/2ML IJ SOLN
INTRAMUSCULAR | Status: AC | PRN
  Administered 2020-12-11: 1 mg via INTRAVENOUS
  Administered 2020-12-11 (×2): 0.5 mg via INTRAVENOUS

## 2020-12-11 MED ORDER — CLONIDINE HCL 0.2 MG PO TABS
0.2000 mg | ORAL_TABLET | Freq: Two times a day (BID) | ORAL | Status: DC
  Administered 2020-12-11 (×2): 0.2 mg via ORAL
  Filled 2020-12-11 (×2): qty 1

## 2020-12-11 MED ORDER — SEVELAMER CARBONATE 800 MG PO TABS
800.0000 mg | ORAL_TABLET | Freq: Three times a day (TID) | ORAL | Status: DC
  Administered 2020-12-11 – 2020-12-17 (×12): 800 mg via ORAL
  Filled 2020-12-11 (×16): qty 1

## 2020-12-11 NOTE — Consult Note (Addendum)
Chief Complaint: Patient was seen in consultation today for liver lesion aspiration/ drain/Biopsy Chief Complaint  Patient presents with   Abdominal Pain   at the request of Dr Denton Brick   Supervising Physician: Daryll Brod  Patient Status: [facility] IP  History of Present Illness: [name] is a 70 y.o. male   DM; ESRD; HTN; CHF; CABG.  N/V abd pain x 1 month Worsening pain ; fever Vomited 3 x after dialysis yesterday To ED 6/29 CT:  IMPRESSION: Numerous irregular low-density lesions throughout the liver, the largest in the posterior right hepatic lobe measuring 7.4 cm. These could reflect necrotic metastases or abscesses. Given the patient's history and appearance, abscesses are favored.  Request for liver lesion aspiration/drain- possible biopsy Dr Annamaria Boots has reviewed imaging and approves procedure   Past Medical History:  Diagnosis Date   Arthritis    CAD (coronary artery disease)    STEMI with LAD stent by [facility] in 2005   Dependence on renal dialysis ([place])    Depression    Diabetes mellitus without complication ([place])    History of kidney stones    Hypertension    Kidney infection    MI (mitral incompetence)    Myocardial infarction ([place])    Seasonal allergies     Past Surgical History:  Procedure Laterality Date   AV FISTULA PLACEMENT Left 01/30/2014   Procedure: INSERTION OF ARTERIOVENOUS (AV) GORE-TEX GRAFT LEFT UPPER ARM;  Surgeon: Elam Dutch, MD;  Location: [facility];  Service: Vascular;  Laterality: Left;   BACK SURGERY     CARDIAC CATHETERIZATION N/A 04/01/2015   Procedure: Right/Left Heart Cath and Coronary Angiography;  Surgeon: Leonie Man, MD;  Location: [facility];  Service: Cardiovascular;  Laterality: N/A;   COLONOSCOPY N/A 10/12/2016   Procedure: COLONOSCOPY;  Surgeon: Daneil Dolin, MD;  Location: [facility];  Service: Endoscopy;  Laterality: N/A;  2:00pm   COLONOSCOPY W/ POLYPECTOMY     CORONARY ANGIOPLASTY WITH  STENT PLACEMENT  2008   CORONARY ARTERY BYPASS GRAFT N/A 04/03/2015   Procedure: CORONARY ARTERY BYPASS GRAFTING (CABG) x  4 (LIMA to LAD, SVG to DIAGONAL, SVG to OM1, and SVG to RCA) with EVH from right greater saphenous thigh and partial lower leg vein ;  Surgeon: Ivin Poot, MD;  Location: [facility];  Service: Open Heart Surgery;  Laterality: N/A;   EYE SURGERY Right    lens implant   INSERTION OF DIALYSIS CATHETER Right 01/30/2014   Procedure: INSERTION OF DIALYSIS CATHETER-RIGHT INTERNAL JUGULAR;  Surgeon: Elam Dutch, MD;  Location: [facility];  Service: Vascular;  Laterality: Right;   POLYPECTOMY  10/12/2016   Procedure: POLYPECTOMY;  Surgeon: Daneil Dolin, MD;  [facility];  Service: Endoscopy;;  colon   SPINE SURGERY     TEE WITHOUT CARDIOVERSION N/A 04/03/2015   Procedure: TRANSESOPHAGEAL ECHOCARDIOGRAM (TEE);  Surgeon: Ivin Poot, MD;  Location: [facility];  Service: Open Heart Surgery;  Laterality: N/A;    Allergies: Shellfish allergy  Medications: Prior to Admission medications   Medication Sig Start Date End Date Taking? Authorizing Provider  acetaminophen (TYLENOL) 500 MG tablet Take 500 mg by mouth every 6 (six) hours as needed for mild pain or moderate pain.   Yes [provider]  albuterol (PROVENTIL HFA;VENTOLIN HFA) 108 (90 Base) MCG/ACT inhaler Inhale 2 puffs into the lungs every 6 (six) hours as needed. 07/29/18  Yes Rancour, Annie Main, MD  amLODipine (NORVASC) 10 MG  tablet Take 1 tablet (10 mg total) by mouth daily. 11/17/20  Yes Barton Dubois, MD  carvedilol (COREG) 25 MG tablet Take 1 tablet (25 mg total) by mouth 2 (two) times daily. 11/17/20  Yes Barton Dubois, MD  cloNIDine (CATAPRES) 0.2 MG tablet Take 1 tablet (0.2 mg total) by mouth 2 (two) times daily. 11/17/20  Yes Barton Dubois, MD  diphenhydramine-acetaminophen (TYLENOL PM) 25-500 MG TABS tablet Take 1 tablet by mouth at bedtime as needed (sleep).   Yes [provider]  insulin  glargine (LANTUS) 100 UNIT/ML injection Inject 10-20 Units into the skin at bedtime.   Yes [provider]  latanoprost (XALATAN) 0.005 % ophthalmic solution Place 2 drops into both eyes 2 (two) times daily. 08/27/16  Yes [provider]  magnesium hydroxide (MILK OF MAGNESIA) 400 MG/5ML suspension Take 15 mLs by mouth daily as needed for mild constipation.   Yes [provider]  meclizine (ANTIVERT) 25 MG tablet Take 1 tablet (25 mg total) by mouth 3 (three) times daily as needed for dizziness. 11/17/20  Yes Barton Dubois, MD  minoxidil (LONITEN) 10 MG tablet Take 0.5 tablets (5 mg total) by mouth at bedtime. Patient does not take as prescribed. Sometimes takes a 1/2 tablet depending on BP readings Patient taking differently: Take 10 mg by mouth at bedtime. 11/17/20  Yes Barton Dubois, MD  Multiple Vitamin (DAILY VITAMIN PO) Take 1 tablet by mouth daily as needed (supplement).   Yes [provider]  NITROSTAT 0.4 MG SL tablet Place 0.4 mg under the tongue every 5 (five) minutes as needed for chest pain.  01/23/15  Yes [provider]  Omega-3 Fatty Acids (FISH OIL) 1000 MG CAPS Take 1,000 mg by mouth daily.   Yes [provider]  OVER THE COUNTER MEDICATION Take 1 tablet by mouth daily. Beet Root   Yes [provider]  sildenafil (VIAGRA) 50 MG tablet Take 50 mg by mouth as needed for erectile dysfunction.   Yes [provider]  aspirin EC 325 MG tablet Take 325mg s once daily on non dialysis days. Dialysis days are Mon, Wed, Fri Patient not taking: Reported on 12/04/2020    [provider]  atorvastatin (LIPITOR) 80 MG tablet Take 1 tablet (80 mg total) by mouth daily at 6 PM. Patient not taking: No sig reported 04/10/15   Nani Skillern, PA-C  cloNIDine (CATAPRES) 0.2 MG tablet 1 tablet Patient not taking: Reported on 12/10/2020    [provider]  minoxidil (LONITEN) 10 MG tablet 0.5 tablet Patient not  taking: Reported on 12/10/2020    [provider]  polyethylene glycol-electrolytes (TRILYTE) 420 g solution Take 4,000 mLs by mouth as directed. Patient not taking: No sig reported 09/14/16   Daneil Dolin, MD  sevelamer carbonate (RENVELA) 800 MG tablet 1 tablet with meals Patient not taking: Reported on 12/10/2020    [provider]  zolpidem (AMBIEN) 10 MG tablet Take 10 mg by mouth at bedtime as needed for sleep.  Patient not taking: Reported on 12/10/2020 05/29/16   [provider]     Family History  Problem Relation Age of Onset   Diabetes Mother    Hypertension Mother    Diabetes Father    Hypertension Father    Diabetes Other     Social History   Socioeconomic History   Marital status: Married    Spouse name: Not on file   Number of children: Not on file   Years of  education: Not on file   Highest education level: Not on file  Occupational History   Not on file  Tobacco Use   Smoking status: Never   Smokeless tobacco: Former    Types: Chew    Quit date: 06/14/2010  Vaping Use   Vaping Use: Never used  Substance and Sexual Activity   Alcohol use: No   Drug use: No   Sexual activity: Not on file  Other Topics Concern   Not on file  Social History Narrative   Not on file   Social Determinants of Health   Financial Resource Strain: Not on file  Food Insecurity: Not on file  Transportation Needs: Not on file  Physical Activity: Not on file  Stress: Not on file  Social Connections: Not on file    Review of Systems: A 12 point ROS discussed and pertinent positives are indicated in the HPI above.  All other systems are negative.  Review of Systems  Constitutional:  Positive for activity change, fatigue and fever.  Respiratory:  Negative for shortness of breath.   Cardiovascular:  Negative for chest pain.  Gastrointestinal:  Positive for abdominal pain, nausea and vomiting.  Musculoskeletal:  Negative for back pain.   Psychiatric/Behavioral:  Negative for behavioral problems and confusion.    Vital Signs: BP (!) 115/52   Pulse 81   Temp 99.6 F (37.6 C) (Oral)   Resp 17   Ht 6\' 5"  (1.956 m)   Wt 180 lb (81.6 kg)   SpO2 97%   BMI 21.34 kg/m   Physical Exam Vitals reviewed.  Cardiovascular:     Rate and Rhythm: Normal rate and regular rhythm.     Heart sounds: Murmur heard.  Pulmonary:     Effort: Pulmonary effort is normal.     Breath sounds: Normal breath sounds.  Abdominal:     Palpations: Abdomen is soft.     Tenderness: There is abdominal tenderness.  Skin:    General: Skin is warm.  Neurological:     Mental Status: He is alert and oriented to person, place, and time.  Psychiatric:        Behavior: Behavior normal.    Imaging: CT Head Wo Contrast  Result Date: 11/16/2020 CLINICAL DATA:  Loss of balance in stiff neck for 3 weeks. EXAM: CT HEAD WITHOUT CONTRAST TECHNIQUE: Contiguous axial images were obtained from the base of the skull through the vertex without intravenous contrast. COMPARISON:  CT head dated 06/14/2016. FINDINGS: Brain: No evidence of acute infarction, hemorrhage, hydrocephalus, extra-axial collection or mass lesion/mass effect. There is mild cerebral volume loss with associated ex vacuo dilatation. Periventricular white matter hypoattenuation likely represents chronic small vessel ischemic disease. Vascular: There are vascular calcifications in the carotid siphons. Skull: Normal. Negative for fracture or focal lesion. Sinuses/Orbits: There is mild right ethmoid sinus disease. Other: None. IMPRESSION: No acute intracranial process. Electronically Signed   By: Zerita Boers M.D.   On: 11/16/2020 12:10   MR BRAIN WO CONTRAST  Result Date: 11/17/2020 CLINICAL DATA:  Dizziness and confusion EXAM: MRI HEAD WITHOUT CONTRAST TECHNIQUE: Multiplanar, multiecho pulse sequences of the brain and surrounding structures were obtained without intravenous contrast. COMPARISON:  None.  FINDINGS: Brain: There is no acute infarction or intracranial hemorrhage. There is no intracranial mass, mass effect, or edema. There is no hydrocephalus or extra-axial fluid collection. Patchy T2 hyperintensity in the supratentorial white matter is nonspecific but may reflect mild chronic microvascular ischemic changes. Prominence of the ventricles and sulci  reflects mild generalized parenchymal volume loss. A punctate focus of susceptibility is present in the left frontoparietal subcortical white matter. Additional focus is present in the right occipitotemporal region. These may reflect chronic microhemorrhages or mineralization. Vascular: Major vessel flow voids at the skull base are preserved. Skull and upper cervical spine: Normal marrow signal is preserved. Sinuses/Orbits: Paranasal sinuses are aerated. Orbits are unremarkable. Other: Sella is unremarkable.  Mastoid air cells are clear. IMPRESSION: No evidence of recent infarction, hemorrhage, or mass. Mild chronic microvascular ischemic changes.  Is Electronically Signed   By: Macy Mis M.D.   On: 11/17/2020 12:15   CT ABDOMEN PELVIS W CONTRAST  Result Date: 12/10/2020 CLINICAL DATA:  Abdominal pain, fever, vomiting EXAM: CT ABDOMEN AND PELVIS WITH CONTRAST TECHNIQUE: Multidetector CT imaging of the abdomen and pelvis was performed using the standard protocol following bolus administration of intravenous contrast. CONTRAST:  117mL OMNIPAQUE IOHEXOL 300 MG/ML  SOLN COMPARISON:  None. FINDINGS: Lower chest: Small right pleural effusion. Atelectasis in the lower lobes. Diffuse coronary artery calcifications. Scattered aortic calcifications. Hepatobiliary: Numerous irregular low-density masses throughout the liver. Largest is in the posterior right hepatic lobe measuring up to 7.4 cm. Largest area in the left hepatic lobe measures up to 5 cm. Other smaller low-density irregular lesions throughout the liver. These could reflect necrotic masses or  abscesses. Gallbladder unremarkable. No biliary ductal dilatation. Pancreas: No focal abnormality or ductal dilatation. Spleen: No focal abnormality.  Normal size. Adrenals/Urinary Tract: Numerous bilateral renal cysts. Kidneys are atrophic. No hydronephrosis. Adrenal glands and urinary bladder unremarkable. Stomach/Bowel: Normal appendix. Stomach, large and small bowel grossly unremarkable. Vascular/Lymphatic: Heavily calcified aorta, iliac vessels and branch vessels. No evidence of aneurysm or adenopathy. Reproductive: Prostate enlargement. Other: No free fluid or free air. Musculoskeletal: No acute bony abnormality. IMPRESSION: Numerous irregular low-density lesions throughout the liver, the largest in the posterior right hepatic lobe measuring 7.4 cm. These could reflect necrotic metastases or abscesses. Given the patient's history and appearance, abscesses are favored. Aortoiliac atherosclerosis. Numerous low-density lesions throughout the kidneys, likely cysts. Prostate enlargement. These results were called by telephone at the time of interpretation on 12/10/2020 at 7:48 pm to provider Dr. Roderic Palau, who verbally acknowledged these results. Electronically Signed   By: Rolm Baptise M.D.   On: 12/10/2020 19:50   DG Chest Port 1 View  Result Date: 12/04/2020 CLINICAL DATA:  [place] EXAM: PORTABLE CHEST 1 VIEW COMPARISON:  07/28/2018 FINDINGS: Cardiomegaly status post median sternotomy and CABG. Bandlike atelectasis or scarring in the bilateral lung bases. The visualized skeletal structures are unremarkable. IMPRESSION: 1.  Cardiomegaly. 2.  Bandlike atelectasis or scarring in the bilateral lung bases. Electronically Signed   By: Eddie Candle M.D.   On: 12/04/2020 13:29    Labs:  CBC: Recent Labs    11/17/20 0624 12/04/20 1334 12/10/20 1815 12/11/20 0330  WBC 9.9 11.5* 16.7* 16.6*  HGB 10.3* 7.3* 9.0* 9.3*  HCT 30.2* 22.1* 27.8* 28.9*  PLT 228 276 259 248    COAGS: Recent Labs    12/11/20 0330   INR 1.4*    BMP: Recent Labs    11/17/20 0624 12/04/20 1334 12/10/20 1815 12/11/20 0330  NA 127* 129* 134* 130*  K 3.8 3.6 3.6 3.6  CL 85* 87* 89* 89*  CO2 26 29 32 27  GLUCOSE 226* 341* 319* 322*  BUN 80* 29* 18 25*  CALCIUM 9.2 8.4* 8.7* 8.2*  CREATININE 12.06* 6.82* 4.09* 4.73*  GFRNONAA 4* 8* 15* 13*  LIVER FUNCTION TESTS: Recent Labs    12/04/20 1334 12/10/20 1815 12/11/20 0330  BILITOT 0.7 0.8 0.8  AST 102* 79* 43*  ALT 92* 81* 67*  ALKPHOS 87 140* 129*  PROT 6.7 7.5 7.0  ALBUMIN 2.4* 2.5* 2.4*    TUMOR MARKERS: No results for input(s): AFPTM, CEA, CA199, CHROMGRNA in the last 8760 hours.  Assessment and Plan:  Abd pain; N/V/fever Liver lesion on CT Now scheduled for liver lesion aspiration/drain placement/biopsy Risks and benefits of liver lesion aspiration/drain/biopsy was discussed with the patient and/or patient's family including, but not limited to bleeding, infection, damage to adjacent structures or low yield requiring additional tests.  All of the questions were answered and there is agreement to proceed. Consent signed and in chart.   Pt is being transferred to [facility] now for procedure [facility] is admitting pt to [facility]- RM [id number]  Thank you for this interesting consult.  I greatly enjoyed meeting [name] and look forward to participating in their care.  A copy of this report was sent to the requesting provider on this date.  Electronically Signed: Lavonia Drafts, PA-C 12/11/2020, 11:05 AM   I spent a total of 20 Minutes    in face to face in clinical consultation, greater than 50% of which was counseling/coordinating care for liver lesion aspiration/drain/biopsy

## 2020-12-11 NOTE — Sedation Documentation (Signed)
Pt arrived from [facility] via [facility]. Pt VSS. Pt is alert and oriented x4, npo, consent signed and in chart.

## 2020-12-11 NOTE — ED Notes (Signed)
AC called and informed Dr Denton Brick states pt needs bed at [facility] today for liver biopsy at 1600.

## 2020-12-11 NOTE — Progress Notes (Signed)
Patient ID: [name], male   DOB: [birth year], 70 y.o.   MRN: [id number]   Pt is scheduled to go to [facility] Rad for liver lesion aspiration/bx. RN to arrange ambulance ASAP  Pt to return to [facility] after procedure

## 2020-12-11 NOTE — Procedures (Signed)
Pre procedural Dx: Hepatic abscesses Post procedural Dx: Same  Technically successful CT guided placed of a 12 Fr drainage catheter placement into the dominant abscess within the posterior segment of the right lobe of the liver yielding 80 cc of purulent fluid.   Technically successful CT guided placed of a 12 Fr drainage catheter placement into the abscess within the left lobe of the liver yielding 25 cc of purulent fluid.   A representative aspirated sample was capped and sent to the laboratory for analysis.    Both drains connected to JP bulbs.   EBL: Trace Complications: None immediate  Ronny Bacon, MD Pager #: [phone]

## 2020-12-11 NOTE — Progress Notes (Signed)
Inpatient Diabetes Program Recommendations  AACE/ADA: New Consensus Statement on Inpatient Glycemic Control (2015)  Target Ranges:  Prepandial:   less than 140 mg/dL      Peak postprandial:   less than 180 mg/dL (1-2 hours)      Critically ill patients:  140 - 180 mg/dL   Lab Results  Component Value Date   GLUCAP 95 11/17/2020   HGBA1C 8.6 (H) 11/16/2020    Review of Glycemic Control  Diabetes history: Dm 2 Outpatient Diabetes medications: Lantus 10-20 units  Current orders for Inpatient glycemic control: None  A1c 8.6% on 6/5  Inpatient Diabetes Program Recommendations:    Glucose 322 - Consider Novolog 0-6 units Q4 hours (starts at 150 glucose)  Thanks,  Tama Headings RN, MSN, BC-ADM Inpatient Diabetes Coordinator Team Pager [phone] (8a-5p)

## 2020-12-11 NOTE — Progress Notes (Addendum)
   IR aware of pt In review Will place orders for bx accordingly - dependent on review

## 2020-12-11 NOTE — Progress Notes (Signed)
Patient Demographics:    [name], is a 70 y.o. male, DOB - [birth year], QBH:[id number]  Admit date - 12/10/2020   Admitting Physician Ejiroghene Arlyce Dice, MD  Outpatient Primary MD for the patient is [facility]  LOS - 1   Chief Complaint  Patient presents with   Abdominal Pain        Subjective:    [name] today has No chest pain,   nausea and abdominal pain persist, no further emesis, -Chills persist  Assessment  & Plan :    Principal Problem:   Liver lesion Active Problems:   End stage renal disease (HCC)   S/P CABG (coronary artery bypass graft)   DM (diabetes mellitus) ([place])  Brief Summary:- 70 y.o. male with medical history significant for diabetes mellitus, ESRD, hypertension, diastolic CHF, CAD with s/p CABG admitted on 12/10/2020 with abdominal pain,  leukocytosis, as well as vomiting with CT abdomen and pelvis findings of possible liver abscess Versus necrotic metastatic lesions -Going to [place] for IR biopsy on 12/11/20  A/p  1)Liver Lesions--infection/abscess Versus necrotic metastatic lesions  -IR consult for ultrasound-guided biopsy requested CT abdomen and pelvis with contrast demonstrated numerous irregular low-density lesions throughout the liver, the largest in the posterior right hepatic lobe measuring 7.4 cm -Continue Vanco and Zosyn for now pending results of IR biopsy and pending blood culture data  2)ESRD--gets his hemodialysis on a MWF schedule at [facility]--- nephrology consult for HD while in-house requested  3)CAD with s/p CABG --aspirin on hold given pending liver biopsy, continue Coreg-, Lipitor on hold due to liver concerns  4)Anemia of ESRD--- Hgb is stable,  EPO/ESA agent per nephrology team  5)DM2-A1c is 8.6 reflecting uncontrolled DM PTA -Patient is n.p.o. to allow for liver biopsy so will  not be too aggressive with sliding scale insulin at this time -Hold Lantus insulin Use Novolog/Humalog Sliding scale insulin with Accu-Cheks/Fingersticks as ordered   6)Unsteady Gait/Ataxia--recent work-up including CT head and MRI brain essentially negative, -B12 and TSH were WNL -Patient was advised to use OTC meclizine as needed -Consider outpatient physical therapy vestibular exercises if symptoms persist  7)HTN--continue Amlodipine 10 mg daily, Coreg 25 mg twice daily, clonidine 0.2 mg twice daily and minoxidil at bedtime  Disposition/Need for in-Hospital Stay- patient unable to be discharged at this time due to --Going to [place] for IR biopsy on 12/11/20, patient is IV antibiotics for possible liver abscess  Status is: Inpatient  Remains inpatient appropriate because: Please see disposition above  Disposition: The patient is from: Home              Anticipated d/c is to: Home              Anticipated d/c date is: > 3 days              Patient currently is not medically stable to d/c. Barriers: Not Clinically Stable-   Code Status :  -  Code Status: Full Code   Family Communication:    NA (patient is alert, awake and coherent)   Consults  :  IR/Nephrology  DVT Prophylaxis  :   - SCDs   SCDs Start: 12/11/20  0112    Lab Results  Component Value Date   PLT 248 12/11/2020    Inpatient Medications  Scheduled Meds:  [START ON 12/12/2020] amLODipine  10 mg Oral Daily   carvedilol  25 mg Oral BID   cloNIDine  0.2 mg Oral BID   minoxidil  5 mg Oral QHS   sevelamer carbonate  800 mg Oral TID WC   Continuous Infusions:  piperacillin-tazobactam (ZOSYN)  IV     promethazine (PHENERGAN) injection (IM or IVPB)     [START ON 12/12/2020] vancomycin     PRN Meds:.acetaminophen **OR** acetaminophen, fentaNYL (SUBLIMAZE) injection, ondansetron (ZOFRAN) IV, polyethylene glycol, promethazine (PHENERGAN) injection (IM or IVPB)    Anti-infectives (From admission, onward)     Start     Dose/Rate Route Frequency Ordered Stop   12/12/20 1200  vancomycin (VANCOCIN) IVPB 1000 mg/200 mL premix        1,000 mg 200 mL/hr over 60 Minutes Intravenous Every M-W-F (Hemodialysis) 12/10/20 2250     12/11/20 2030  ceFEPIme (MAXIPIME) 2 g in sodium chloride 0.9 % 100 mL IVPB  Status:  Discontinued        2 g 200 mL/hr over 30 Minutes Intravenous Every 24 hours 12/10/20 2033 12/10/20 2034   12/11/20 2030  ceFEPIme (MAXIPIME) 1 g in sodium chloride 0.9 % 100 mL IVPB  Status:  Discontinued        1 g 200 mL/hr over 30 Minutes Intravenous Every 24 hours 12/10/20 2034 12/10/20 2240   12/11/20 1800  piperacillin-tazobactam (ZOSYN) IVPB 3.375 g        3.375 g 12.5 mL/hr over 240 Minutes Intravenous Every 12 hours 12/11/20 0814     12/11/20 0600  piperacillin-tazobactam (ZOSYN) IVPB 2.25 g  Status:  Discontinued        2.25 g 100 mL/hr over 30 Minutes Intravenous Every 8 hours 12/10/20 2240 12/11/20 0814   12/10/20 2245  vancomycin (VANCOREADY) IVPB 1750 mg/350 mL        1,750 mg 175 mL/hr over 120 Minutes Intravenous  Once 12/10/20 2242 12/11/20 0305   12/10/20 2030  ceFEPIme (MAXIPIME) 2 g in sodium chloride 0.9 % 100 mL IVPB        2 g 200 mL/hr over 30 Minutes Intravenous  Once 12/10/20 2022 12/10/20 2145   12/10/20 2030  metroNIDAZOLE (FLAGYL) IVPB 500 mg        500 mg 100 mL/hr over 60 Minutes Intravenous  Once 12/10/20 2022 12/11/20 0103         Objective:   Vitals:   12/11/20 0809 12/11/20 0900 12/11/20 0916 12/11/20 1000  BP:  (!) 156/63  (!) 115/52  Pulse:  89  81  Resp:  19  17  Temp: 99.5 F (37.5 C)  99.6 F (37.6 C)   TempSrc: Oral  Oral   SpO2:  96%  97%  Weight:      Height:        Wt Readings from Last 3 Encounters:  12/10/20 81.6 kg  12/04/20 81.6 kg  11/17/20 81.9 kg     Intake/Output Summary (Last 24 hours) at 12/11/2020 1033 Last data filed at 12/10/2020 2146 Gross per 24 hour  Intake 526.31 ml  Output --  Net 526.31 ml      Physical Exam  Gen:- Awake Alert,  in no apparent distress  HEENT:- [place].AT, No sclera icterus Neck-Supple Neck,No JVD,.  Lungs-  CTAB , fair symmetrical air movement CV- S1, S2 normal, regular  Abd-  +  ve B.Sounds, Abd Soft, upper abdominal tenderness without rebound or guarding, right worse than left    Extremity/Skin:- No  edema, pedal pulses present  Psych-affect is appropriate, oriented x3 Neuro-no new focal deficits, no tremors MSK-left upper extremity AV graft with positive bruit and thrill   Data Review:   Micro Results Recent Results (from the past 240 hour(s))  Blood culture (routine x 2)     Status: None (Preliminary result)   Collection Time: 12/10/20  8:45 PM   Specimen: BLOOD RIGHT ARM  Result Value Ref Range Status   Specimen Description BLOOD RIGHT ARM  Final   Special Requests   Final    BOTTLES DRAWN AEROBIC AND ANAEROBIC Blood Culture results may not be optimal due to an inadequate volume of blood received in culture bottles Performed at [facility], [address] [place], [place] [address]    Culture PENDING  Incomplete   Report Status PENDING  Incomplete  Blood culture (routine x 2)     Status: None (Preliminary result)   Collection Time: 12/10/20 10:03 PM   Specimen: BLOOD RIGHT HAND  Result Value Ref Range Status   Specimen Description BLOOD RIGHT HAND  Final   Special Requests   Final    BOTTLES DRAWN AEROBIC AND ANAEROBIC Blood Culture results may not be optimal due to an inadequate volume of blood received in culture bottles Performed at [facility], [address] [place], [place] [address]    Culture PENDING  Incomplete   Report Status PENDING  Incomplete  Blood culture (routine x 2)     Status: None (Preliminary result)   Collection Time: 12/11/20  9:09 AM   Specimen: BLOOD  Result Value Ref Range Status   Specimen Description BLOOD RIGHT HAND  Final   Special Requests   Final    BOTTLES DRAWN AEROBIC AND ANAEROBIC Blood Culture  adequate volume Performed at [facility], [address], [place], [place] [address]    Culture PENDING  Incomplete   Report Status PENDING  Incomplete    Radiology Reports CT Head Wo Contrast  Result Date: 11/16/2020 CLINICAL DATA:  Loss of balance in stiff neck for 3 weeks. EXAM: CT HEAD WITHOUT CONTRAST TECHNIQUE: Contiguous axial images were obtained from the base of the skull through the vertex without intravenous contrast. COMPARISON:  CT head dated 06/14/2016. FINDINGS: Brain: No evidence of acute infarction, hemorrhage, hydrocephalus, extra-axial collection or mass lesion/mass effect. There is mild cerebral volume loss with associated ex vacuo dilatation. Periventricular white matter hypoattenuation likely represents chronic small vessel ischemic disease. Vascular: There are vascular calcifications in the carotid siphons. Skull: Normal. Negative for fracture or focal lesion. Sinuses/Orbits: There is mild right ethmoid sinus disease. Other: None. IMPRESSION: No acute intracranial process. Electronically Signed   By: Zerita Boers M.D.   On: 11/16/2020 12:10   MR BRAIN WO CONTRAST  Result Date: 11/17/2020 CLINICAL DATA:  Dizziness and confusion EXAM: MRI HEAD WITHOUT CONTRAST TECHNIQUE: Multiplanar, multiecho pulse sequences of the brain and surrounding structures were obtained without intravenous contrast. COMPARISON:  None. FINDINGS: Brain: There is no acute infarction or intracranial hemorrhage. There is no intracranial mass, mass effect, or edema. There is no hydrocephalus or extra-axial fluid collection. Patchy T2 hyperintensity in the supratentorial white matter is nonspecific but may reflect mild chronic microvascular ischemic changes. Prominence of the ventricles and sulci reflects mild generalized parenchymal volume loss. A punctate focus of susceptibility is present in the left frontoparietal subcortical white matter. Additional focus is  present in the right occipitotemporal region. These  may reflect chronic microhemorrhages or mineralization. Vascular: Major vessel flow voids at the skull base are preserved. Skull and upper cervical spine: Normal marrow signal is preserved. Sinuses/Orbits: Paranasal sinuses are aerated. Orbits are unremarkable. Other: Sella is unremarkable.  Mastoid air cells are clear. IMPRESSION: No evidence of recent infarction, hemorrhage, or mass. Mild chronic microvascular ischemic changes.  Is Electronically Signed   By: Macy Mis M.D.   On: 11/17/2020 12:15   CT ABDOMEN PELVIS W CONTRAST  Result Date: 12/10/2020 CLINICAL DATA:  Abdominal pain, fever, vomiting EXAM: CT ABDOMEN AND PELVIS WITH CONTRAST TECHNIQUE: Multidetector CT imaging of the abdomen and pelvis was performed using the standard protocol following bolus administration of intravenous contrast. CONTRAST:  159mL OMNIPAQUE IOHEXOL 300 MG/ML  SOLN COMPARISON:  None. FINDINGS: Lower chest: Small right pleural effusion. Atelectasis in the lower lobes. Diffuse coronary artery calcifications. Scattered aortic calcifications. Hepatobiliary: Numerous irregular low-density masses throughout the liver. Largest is in the posterior right hepatic lobe measuring up to 7.4 cm. Largest area in the left hepatic lobe measures up to 5 cm. Other smaller low-density irregular lesions throughout the liver. These could reflect necrotic masses or abscesses. Gallbladder unremarkable. No biliary ductal dilatation. Pancreas: No focal abnormality or ductal dilatation. Spleen: No focal abnormality.  Normal size. Adrenals/Urinary Tract: Numerous bilateral renal cysts. Kidneys are atrophic. No hydronephrosis. Adrenal glands and urinary bladder unremarkable. Stomach/Bowel: Normal appendix. Stomach, large and small bowel grossly unremarkable. Vascular/Lymphatic: Heavily calcified aorta, iliac vessels and branch vessels. No evidence of aneurysm or adenopathy. Reproductive: Prostate enlargement. Other: No free fluid or free air.  Musculoskeletal: No acute bony abnormality. IMPRESSION: Numerous irregular low-density lesions throughout the liver, the largest in the posterior right hepatic lobe measuring 7.4 cm. These could reflect necrotic metastases or abscesses. Given the patient's history and appearance, abscesses are favored. Aortoiliac atherosclerosis. Numerous low-density lesions throughout the kidneys, likely cysts. Prostate enlargement. These results were called by telephone at the time of interpretation on 12/10/2020 at 7:48 pm to provider Dr. Roderic Palau, who verbally acknowledged these results. Electronically Signed   By: Rolm Baptise M.D.   On: 12/10/2020 19:50   DG Chest Port 1 View  Result Date: 12/04/2020 CLINICAL DATA:  [place] EXAM: PORTABLE CHEST 1 VIEW COMPARISON:  07/28/2018 FINDINGS: Cardiomegaly status post median sternotomy and CABG. Bandlike atelectasis or scarring in the bilateral lung bases. The visualized skeletal structures are unremarkable. IMPRESSION: 1.  Cardiomegaly. 2.  Bandlike atelectasis or scarring in the bilateral lung bases. Electronically Signed   By: Eddie Candle M.D.   On: 12/04/2020 13:29     CBC Recent Labs  Lab 12/04/20 1334 12/10/20 1815 12/11/20 0330  WBC 11.5* 16.7* 16.6*  HGB 7.3* 9.0* 9.3*  HCT 22.1* 27.8* 28.9*  PLT 276 259 248  MCV 100.0 95.2 96.7  MCH 33.0 30.8 31.1  MCHC 33.0 32.4 32.2  RDW 12.6 14.3 14.3  LYMPHSABS 0.9 0.6*  --   MONOABS 0.9 0.6  --   EOSABS 0.2 0.2  --   BASOSABS 0.1 0.1  --     Chemistries  Recent Labs  Lab 12/04/20 1334 12/10/20 1815 12/10/20 1816 12/11/20 0330  NA 129* 134*  --  130*  K 3.6 3.6  --  3.6  CL 87* 89*  --  89*  CO2 29 32  --  27  GLUCOSE 341* 319*  --  322*  BUN 29* 18  --  25*  CREATININE 6.82* 4.09*  --  4.73*  CALCIUM 8.4* 8.7*  --  8.2*  MG 2.0  --  2.1  --   AST 102* 79*  --  43*  ALT 92* 81*  --  67*  ALKPHOS 87 140*  --  129*  BILITOT 0.7 0.8  --  0.8    ------------------------------------------------------------------------------------------------------------------ No results for input(s): CHOL, HDL, LDLCALC, TRIG, CHOLHDL, LDLDIRECT in the last 72 hours.  Lab Results  Component Value Date   HGBA1C 8.6 (H) 11/16/2020   ------------------------------------------------------------------------------------------------------------------ No results for input(s): TSH, T4TOTAL, T3FREE, THYROIDAB in the last 72 hours.  Invalid input(s): FREET3 ------------------------------------------------------------------------------------------------------------------ No results for input(s): VITAMINB12, FOLATE, FERRITIN, TIBC, IRON, RETICCTPCT in the last 72 hours.  Coagulation profile Recent Labs  Lab 12/11/20 0330  INR 1.4*    No results for input(s): DDIMER in the last 72 hours.  Cardiac Enzymes No results for input(s): CKMB, TROPONINI, MYOGLOBIN in the last 168 hours.  Invalid input(s): CK ------------------------------------------------------------------------------------------------------------------    Component Value Date/Time   BNP 689.0 (H) 12/04/2020 1334     Roxan Hockey M.D on 12/11/2020 at 10:33 AM  Go to [email or web address] - for contact info  [facility] - Office  [phone]

## 2020-12-11 NOTE — Progress Notes (Signed)
Patient was admitted to [phone]. He arrived from procedure in IR. Tele initiated, VS stable, on 2L O2 [place]. Patient is complaining of pain 10/10 in JP drain sites. Patient's wife and son are at the bedside. Patient currently NPO. Skin intact with the exception of two freshly placed JP drains in abdomen. Belongings include clothing and wallet. Patient and family have been instructed on safety precautions and how to use the call bell.

## 2020-12-12 ENCOUNTER — Inpatient Hospital Stay (HOSPITAL_COMMUNITY): Payer: Medicare Other

## 2020-12-12 LAB — CBC
HCT: 23 % — ABNORMAL LOW (ref 39.0–52.0)
Hemoglobin: 7.6 g/dL — ABNORMAL LOW (ref 13.0–17.0)
MCH: 31 pg (ref 26.0–34.0)
MCHC: 33 g/dL (ref 30.0–36.0)
MCV: 93.9 fL (ref 80.0–100.0)
Platelets: 212 10*3/uL (ref 150–400)
RBC: 2.45 MIL/uL — ABNORMAL LOW (ref 4.22–5.81)
RDW: 14.4 % (ref 11.5–15.5)
WBC: 18.3 10*3/uL — ABNORMAL HIGH (ref 4.0–10.5)
nRBC: 0 % (ref 0.0–0.2)

## 2020-12-12 LAB — IRON AND TIBC: Iron: 19 ug/dL — ABNORMAL LOW (ref 45–182)

## 2020-12-12 LAB — PROTIME-INR
INR: 1.5 — ABNORMAL HIGH (ref 0.8–1.2)
Prothrombin Time: 18.2 seconds — ABNORMAL HIGH (ref 11.4–15.2)

## 2020-12-12 LAB — CBC WITH DIFFERENTIAL/PLATELET
Abs Immature Granulocytes: 0.23 10*3/uL — ABNORMAL HIGH (ref 0.00–0.07)
Basophils Absolute: 0.1 10*3/uL (ref 0.0–0.1)
Basophils Relative: 0 %
Eosinophils Absolute: 0 10*3/uL (ref 0.0–0.5)
Eosinophils Relative: 0 %
HCT: 23.1 % — ABNORMAL LOW (ref 39.0–52.0)
Hemoglobin: 7.5 g/dL — ABNORMAL LOW (ref 13.0–17.0)
Immature Granulocytes: 1 %
Lymphocytes Relative: 4 %
Lymphs Abs: 0.8 10*3/uL (ref 0.7–4.0)
MCH: 31 pg (ref 26.0–34.0)
MCHC: 32.5 g/dL (ref 30.0–36.0)
MCV: 95.5 fL (ref 80.0–100.0)
Monocytes Absolute: 0.6 10*3/uL (ref 0.1–1.0)
Monocytes Relative: 3 %
Neutro Abs: 18 10*3/uL — ABNORMAL HIGH (ref 1.7–7.7)
Neutrophils Relative %: 92 %
Platelets: 212 10*3/uL (ref 150–400)
RBC: 2.42 MIL/uL — ABNORMAL LOW (ref 4.22–5.81)
RDW: 14.5 % (ref 11.5–15.5)
WBC: 19.8 10*3/uL — ABNORMAL HIGH (ref 4.0–10.5)
nRBC: 0 % (ref 0.0–0.2)

## 2020-12-12 LAB — MAGNESIUM: Magnesium: 2.1 mg/dL (ref 1.7–2.4)

## 2020-12-12 LAB — COMPREHENSIVE METABOLIC PANEL
ALT: 99 U/L — ABNORMAL HIGH (ref 0–44)
AST: 134 U/L — ABNORMAL HIGH (ref 15–41)
Albumin: 1.8 g/dL — ABNORMAL LOW (ref 3.5–5.0)
Alkaline Phosphatase: 96 U/L (ref 38–126)
Anion gap: 18 — ABNORMAL HIGH (ref 5–15)
BUN: 41 mg/dL — ABNORMAL HIGH (ref 8–23)
CO2: 23 mmol/L (ref 22–32)
Calcium: 8.2 mg/dL — ABNORMAL LOW (ref 8.9–10.3)
Chloride: 90 mmol/L — ABNORMAL LOW (ref 98–111)
Creatinine, Ser: 7.4 mg/dL — ABNORMAL HIGH (ref 0.61–1.24)
GFR, Estimated: 7 mL/min — ABNORMAL LOW (ref >60)
Glucose, Bld: 330 mg/dL — ABNORMAL HIGH (ref 70–99)
Potassium: 4.2 mmol/L (ref 3.5–5.1)
Sodium: 131 mmol/L — ABNORMAL LOW (ref 135–145)
Total Bilirubin: 1.5 mg/dL — ABNORMAL HIGH (ref 0.3–1.2)
Total Protein: 5.6 g/dL — ABNORMAL LOW (ref 6.5–8.1)

## 2020-12-12 LAB — PHOSPHORUS: Phosphorus: 5.1 mg/dL — ABNORMAL HIGH (ref 2.5–4.6)

## 2020-12-12 LAB — RETICULOCYTES
Immature Retic Fract: 19.1 % — ABNORMAL HIGH (ref 2.3–15.9)
RBC.: 2.46 MIL/uL — ABNORMAL LOW (ref 4.22–5.81)
Retic Count, Absolute: 48.2 10*3/uL (ref 19.0–186.0)
Retic Ct Pct: 2 % (ref 0.4–3.1)

## 2020-12-12 LAB — TYPE AND SCREEN
ABO/RH(D): B POS
Antibody Screen: NEGATIVE

## 2020-12-12 LAB — FERRITIN: Ferritin: 2625 ng/mL — ABNORMAL HIGH (ref 24–336)

## 2020-12-12 LAB — VITAMIN B12: Vitamin B-12: 1449 pg/mL — ABNORMAL HIGH (ref 180–914)

## 2020-12-12 LAB — HEPATITIS B SURFACE ANTIGEN: Hepatitis B Surface Ag: NONREACTIVE

## 2020-12-12 MED ORDER — LIDOCAINE-PRILOCAINE 2.5-2.5 % EX CREA
1.0000 "application " | TOPICAL_CREAM | CUTANEOUS | Status: DC | PRN
  Filled 2020-12-12: qty 5

## 2020-12-12 MED ORDER — VANCOMYCIN HCL IN DEXTROSE 1-5 GM/200ML-% IV SOLN
INTRAVENOUS | Status: AC
  Filled 2020-12-12: qty 200

## 2020-12-12 MED ORDER — SODIUM CHLORIDE 0.9 % IV SOLN: 100.0000 mL | INTRAVENOUS | Status: DC | PRN

## 2020-12-12 MED ORDER — HEPARIN SODIUM (PORCINE) 1000 UNIT/ML DIALYSIS: 1000.0000 [IU] | INTRAMUSCULAR | Status: DC | PRN

## 2020-12-12 MED ORDER — MIDODRINE HCL 5 MG PO TABS
10.0000 mg | ORAL_TABLET | ORAL | Status: DC
  Administered 2020-12-12: 10 mg via ORAL
  Filled 2020-12-12: qty 2

## 2020-12-12 MED ORDER — MIDODRINE HCL 5 MG PO TABS
10.0000 mg | ORAL_TABLET | ORAL | Status: AC
  Administered 2020-12-12: 10 mg via ORAL
  Filled 2020-12-12: qty 2

## 2020-12-12 MED ORDER — LIDOCAINE HCL (PF) 1 % IJ SOLN
5.0000 mL | INTRAMUSCULAR | Status: DC | PRN
  Filled 2020-12-12: qty 5

## 2020-12-12 MED ORDER — PENTAFLUOROPROP-TETRAFLUOROETH EX AERO: 1.0000 "application " | INHALATION_SPRAY | CUTANEOUS | Status: DC | PRN

## 2020-12-12 MED ORDER — SODIUM CHLORIDE 0.9% FLUSH
5.0000 mL | Freq: Three times a day (TID) | INTRAVENOUS | Status: DC
  Administered 2020-12-12 – 2020-12-17 (×15): 5 mL

## 2020-12-12 MED ORDER — IOHEXOL 300 MG/ML  SOLN
100.0000 mL | Freq: Once | INTRAMUSCULAR | Status: AC | PRN
  Administered 2020-12-12: 100 mL via INTRAVENOUS

## 2020-12-12 MED ORDER — ALTEPLASE 2 MG IJ SOLR: 2.0000 mg | Freq: Once | INTRAMUSCULAR | Status: DC | PRN

## 2020-12-12 NOTE — TOC Initial Note (Signed)
Transition of Care [facility]) - Initial/Assessment Note    Patient Details  Name: [name] MRN: [id number] Date of Birth: [birth year]  [facility] CM/SW Contact:    Verdell Carmine, RN Phone Number: 12/12/2020, 1:25 PM  Clinical Narrative:                  70 YO dialysis patient with RLQ pain, liver abscess. . This was drained  by IR and currently on zosyn IV.  Discharge TBD. No word of if patient will need IV antibiotics or can convert to po upon DC. WBC still 18.1. CM will follow for needs.   Expected Discharge Plan: Home/Self Care Barriers to Discharge: Continued Medical Work up   Patient Goals and CMS Choice        Expected Discharge Plan and Services Expected Discharge Plan: Home/Self Care     Post Acute Care Choice:  (outpatient hemodialysis) Living arrangements for the past 2 months: Single Family Home                                      Prior Living Arrangements/Services Living arrangements for the past 2 months: Single Family Home Lives with:: Spouse Patient language and need for interpreter reviewed:: Yes        Need for Family Participation in Patient Care: Yes (Comment) Care giver support system in place?: Yes (comment)   Criminal Activity/Legal Involvement Pertinent to Current Situation/Hospitalization: No - Comment as needed  Activities of Daily Living Home Assistive Devices/Equipment: None ADL Screening (condition at time of admission) Patient's cognitive ability adequate to safely complete daily activities?: Yes Is the patient deaf or have difficulty hearing?: Yes Does the patient have difficulty seeing, even when wearing glasses/contacts?: No Does the patient have difficulty concentrating, remembering, or making decisions?: No Patient able to express need for assistance with ADLs?: Yes Does the patient have difficulty dressing or bathing?: No Independently performs ADLs?: Yes (appropriate for developmental age) Does the  patient have difficulty walking or climbing stairs?: No Weakness of Legs: Both Weakness of Arms/Hands: None  Permission Sought/Granted                  Emotional Assessment       Orientation: : Oriented to Self, Oriented to Place, Oriented to  Time, Oriented to Situation Alcohol / Substance Use: Not Applicable Psych Involvement: No (comment)  Admission diagnosis:  Liver abscess [K75.0] Liver lesion [K76.9] Patient Active Problem List   Diagnosis Date Noted   Liver lesions (Infection Vs Metastasis) 12/10/2020   DM (diabetes mellitus) ([place]) 12/10/2020   Hypo-osmolality and hyponatremia 11/28/2020   Unsteady gait    Ataxia 11/16/2020   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   NPDR (nonproliferative diabetic retinopathy) ([place]) 11/21/2016   Pseudophakia of both eyes 11/21/2016   History of adenomatous polyp of colon 09/14/2016   Coronary artery disease involving native coronary artery of native heart without angina pectoris 12/05/2015   S/P CABG (coronary artery bypass graft) 12/05/2015   Secondary hypertension, unspecified    Acute diastolic heart failure (HCC)    Volume overload 03/31/2015   NSTEMI (non-ST elevated myocardial infarction) ([place]) 03/31/2015   Hypoxia 03/21/2015   Fluid overload, unspecified 08/21/2014   End stage renal disease ([place]) 03/07/2014   Encounter for screening for respiratory tuberculosis 02/08/2014   Shortness of breath 02/05/2014   ESRD (end stage renal disease) ([place]) 02/05/2014   Aftercare including  intermittent dialysis ([place]) 02/05/2014   Coagulation defect, unspecified ([place]) 02/05/2014   Disorder of phosphorus metabolism, unspecified 02/05/2014   Hypoglycemia, unspecified 02/05/2014   Iron deficiency anemia, unspecified 02/05/2014   Moderate protein-calorie malnutrition ([place]) 02/05/2014   Other disorders of plasma-protein metabolism, not elsewhere classified 02/05/2014   Pain, unspecified 02/05/2014   Pruritus, unspecified 02/05/2014    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/05/2014   Thrombocytopenia, unspecified ([place]) 02/05/2014   Chronic kidney disease 01/29/2014   Acute on chronic renal failure ([place]) 01/14/2014   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   Hyponatremia 01/14/2014   Hyperkalemia 01/14/2014   Type 2 diabetes mellitus without complications ([place]) 15/37/9432   HLD (hyperlipidemia) 09/02/2009   Essential (primary) hypertension 09/02/2009   Coronary atherosclerosis 09/02/2009   PCP:  [facility] [place]:   [facility] [address], [place] [id number] [place] [address] [place] [address] Phone: [phone] Fax: [phone]     Social Determinants of Health (SDOH) Interventions    Readmission Risk Interventions No flowsheet data found.

## 2020-12-12 NOTE — Progress Notes (Signed)
Referring Physician(s): Dr. Denton Brick  Supervising Physician: Markus Daft  Patient Status:  [facility] - In-pt  Chief Complaint: Hepatic abscess s/p drain placements 12/11/20 by Dr. Pascal Lux  Subjective: Patient assessed in hemodialysis unit during treatment. Patient asleep but easily arousable. He denies any pain or discomfort.   Allergies: Shellfish allergy  Medications: Prior to Admission medications   Medication Sig Start Date End Date Taking? Authorizing Provider  acetaminophen (TYLENOL) 500 MG tablet Take 500 mg by mouth every 6 (six) hours as needed for mild pain or moderate pain.   Yes [provider]  albuterol (PROVENTIL HFA;VENTOLIN HFA) 108 (90 Base) MCG/ACT inhaler Inhale 2 puffs into the lungs every 6 (six) hours as needed. 07/29/18  Yes Rancour, Annie Main, MD  amLODipine (NORVASC) 10 MG tablet Take 1 tablet (10 mg total) by mouth daily. 11/17/20  Yes Barton Dubois, MD  carvedilol (COREG) 25 MG tablet Take 1 tablet (25 mg total) by mouth 2 (two) times daily. 11/17/20  Yes Barton Dubois, MD  cloNIDine (CATAPRES) 0.2 MG tablet Take 1 tablet (0.2 mg total) by mouth 2 (two) times daily. 11/17/20  Yes Barton Dubois, MD  diphenhydramine-acetaminophen (TYLENOL PM) 25-500 MG TABS tablet Take 1 tablet by mouth at bedtime as needed (sleep).   Yes [provider]  insulin glargine (LANTUS) 100 UNIT/ML injection Inject 10-20 Units into the skin at bedtime.   Yes [provider]  latanoprost (XALATAN) 0.005 % ophthalmic solution Place 2 drops into both eyes 2 (two) times daily. 08/27/16  Yes [provider]  magnesium hydroxide (MILK OF MAGNESIA) 400 MG/5ML suspension Take 15 mLs by mouth daily as needed for mild constipation.   Yes [provider]  meclizine (ANTIVERT) 25 MG tablet Take 1 tablet (25 mg total) by mouth 3 (three) times daily as needed for dizziness. 11/17/20  Yes Barton Dubois, MD  minoxidil (LONITEN) 10 MG tablet Take 0.5 tablets (5 mg total)  by mouth at bedtime. Patient does not take as prescribed. Sometimes takes a 1/2 tablet depending on BP readings Patient taking differently: Take 10 mg by mouth at bedtime. 11/17/20  Yes Barton Dubois, MD  Multiple Vitamin (DAILY VITAMIN PO) Take 1 tablet by mouth daily as needed (supplement).   Yes [provider]  NITROSTAT 0.4 MG SL tablet Place 0.4 mg under the tongue every 5 (five) minutes as needed for chest pain.  01/23/15  Yes [provider]  Omega-3 Fatty Acids (FISH OIL) 1000 MG CAPS Take 1,000 mg by mouth daily.   Yes [provider]  OVER THE COUNTER MEDICATION Take 1 tablet by mouth daily. Beet Root   Yes [provider]  sildenafil (VIAGRA) 50 MG tablet Take 50 mg by mouth as needed for erectile dysfunction.   Yes [provider]  aspirin EC 325 MG tablet Take 325mg s once daily on non dialysis days. Dialysis days are Mon, Wed, Fri Patient not taking: Reported on 12/04/2020    [provider]  atorvastatin (LIPITOR) 80 MG tablet Take 1 tablet (80 mg total) by mouth daily at 6 PM. Patient not taking: No sig reported 04/10/15   Nani Skillern, PA-C  cloNIDine (CATAPRES) 0.2 MG tablet 1 tablet Patient not taking: Reported on 12/10/2020    [provider]  minoxidil (LONITEN) 10 MG tablet 0.5 tablet Patient not taking: Reported on 12/10/2020    [provider]  polyethylene glycol-electrolytes (TRILYTE) 420 g solution Take 4,000 mLs by mouth as directed. Patient not taking: No  sig reported 09/14/16   Rourk, Cristopher Estimable, MD  sevelamer carbonate (RENVELA) 800 MG tablet 1 tablet with meals Patient not taking: Reported on 12/10/2020    [provider]  zolpidem (AMBIEN) 10 MG tablet Take 10 mg by mouth at bedtime as needed for sleep.  Patient not taking: Reported on 12/10/2020 05/29/16   [provider]     Vital Signs: BP (!) 119/49   Pulse 72   Temp 98 F (36.7 C)   Resp 20   Ht 6\' 5"  (1.956 m)    Wt 179 lb 14.3 oz (81.6 kg)   SpO2 95%   BMI 21.33 kg/m   Physical Exam Constitutional:      General: He is not in acute distress. Pulmonary:     Effort: Pulmonary effort is normal.  Abdominal:     Palpations: Abdomen is soft.     Tenderness: There is no abdominal tenderness.     Comments: RUQ drains x 2 to suction. Medial drain had large bloody clot in the tubing that I was able to remove. Both drains flushed. Dressing clean and dry. Scant amount of serosanguineous fluid in each bulb.   Neurological:     Mental Status: He is alert.    Imaging: CT ABDOMEN PELVIS W CONTRAST  Result Date: 12/10/2020 CLINICAL DATA:  Abdominal pain, fever, vomiting EXAM: CT ABDOMEN AND PELVIS WITH CONTRAST TECHNIQUE: Multidetector CT imaging of the abdomen and pelvis was performed using the standard protocol following bolus administration of intravenous contrast. CONTRAST:  155mL OMNIPAQUE IOHEXOL 300 MG/ML  SOLN COMPARISON:  None. FINDINGS: Lower chest: Small right pleural effusion. Atelectasis in the lower lobes. Diffuse coronary artery calcifications. Scattered aortic calcifications. Hepatobiliary: Numerous irregular low-density masses throughout the liver. Largest is in the posterior right hepatic lobe measuring up to 7.4 cm. Largest area in the left hepatic lobe measures up to 5 cm. Other smaller low-density irregular lesions throughout the liver. These could reflect necrotic masses or abscesses. Gallbladder unremarkable. No biliary ductal dilatation. Pancreas: No focal abnormality or ductal dilatation. Spleen: No focal abnormality.  Normal size. Adrenals/Urinary Tract: Numerous bilateral renal cysts. Kidneys are atrophic. No hydronephrosis. Adrenal glands and urinary bladder unremarkable. Stomach/Bowel: Normal appendix. Stomach, large and small bowel grossly unremarkable. Vascular/Lymphatic: Heavily calcified aorta, iliac vessels and branch vessels. No evidence of aneurysm or adenopathy. Reproductive:  Prostate enlargement. Other: No free fluid or free air. Musculoskeletal: No acute bony abnormality. IMPRESSION: Numerous irregular low-density lesions throughout the liver, the largest in the posterior right hepatic lobe measuring 7.4 cm. These could reflect necrotic metastases or abscesses. Given the patient's history and appearance, abscesses are favored. Aortoiliac atherosclerosis. Numerous low-density lesions throughout the kidneys, likely cysts. Prostate enlargement. These results were called by telephone at the time of interpretation on 12/10/2020 at 7:48 pm to provider Dr. Roderic Palau, who verbally acknowledged these results. Electronically Signed   By: Rolm Baptise M.D.   On: 12/10/2020 19:50   CT IMAGE GUIDED DRAINAGE BY PERCUTANEOUS CATHETER  Result Date: 12/11/2020 INDICATION: Fever and chills with abdominal CT demonstrating findings worrisome for hepatic abscesses. Please perform image guided biopsy/aspiration and/or drainage catheter placement for diagnostic and therapeutic purposes. EXAM: ULTRASOUND AND CT-GUIDED HEPATIC ABSCESS DRAINAGE CATHETER PLACEMENT X2 COMPARISON:  CT abdomen and pelvis - 12/10/2020 MEDICATIONS: The patient is currently admitted to the hospital and receiving intravenous antibiotics. The antibiotics were administered within an appropriate time frame prior to the initiation of the procedure. ANESTHESIA/SEDATION: Moderate (conscious) sedation was employed during this procedure. A  total of Versed 2 mg and Fentanyl 75 mcg was administered intravenously. Moderate Sedation Time: 38 minutes. The patient's level of consciousness and vital signs were monitored continuously by radiology nursing throughout the procedure under my direct supervision. CONTRAST:  None COMPLICATIONS: None immediate. PROCEDURE: Informed written consent was obtained from the patient after a discussion of the risks, benefits and alternatives to treatment. The patient was placed supine on the CT gantry and a pre  procedural CT was performed re-demonstrating the known indeterminate hypoattenuating masses/collections with dominant collection within the posterior segment of the right lobe of the liver measuring at least 7.9 x 5.8 cm (image 33, series 2 and additional collection within the posterior aspect the left lobe of the liver measuring approximately 4.8 x 3.5 cm (image 41, series 2). Sonographic evaluation was performed of the anterior segment of the right lobe of the liver however failed to definitively replicate the two small hypoattenuating lesions/collections demonstrated on preceding abdominal CT. The table position was marked and the collections were identified sonographically. The procedures were planned. A timeout was performed prior to the initiation of the procedure. The operative sites were prepped and draped in usual sterile fashion. Under direct ultrasound guidance, the dominant collection within the posterior aspect the right lobe of the liver was accessed with a 17 gauge trocar needle. Multiple ultrasound images were saved for procedural documentation purposes. Next, a small amount of purulent fluid was aspirated from the coaxial needle confirming and hepatic abscess. As such, a short Amplatz wire was coiled within the collection. The identical procedure was repeated for the additional ill-defined lesion within the left lobe of the liver also yielding a return of a small amount of purulent fluid. Appropriate positioning was confirmed with CT imaging. Next, the tracks were serially dilated allowing placement of 12 French percutaneous drainage catheters. Postprocedural imaging demonstrates appropriate positioning of the drainage catheter within the posterior aspect the right lobe of the liver with malpositioning of the left percutaneous drainage catheter. As such, the collection within the left lobe of the liver was re-targeted with ultrasound ultimately allowing successful placement of a 12 French  percutaneous drainage catheter. Appropriate position was confirmed with CT imaging (series 5). Approximately 80 cc of purulent material was aspirated from the drainage catheter within the right lobe of the liver while approximately 25 cc of purulent fluid was aspirated from the left-sided percutaneous drainage catheter. A representative sample of aspirated fluid was capped and sent to the laboratory for analysis. Both drainage catheters were flushed with saline and connected to JP bulbs. Both drainage catheters were secured to the skin entrance site within interrupted sutures and StatLock devices. Dressings were applied. The patient tolerated both procedures well without immediate postprocedural complication. IMPRESSION: 1. Successful ultrasound and CT-guided placement of 12 French percutaneous drainage catheter into dominant abscess within the posterior segment of the right lobe of the liver yielding 80 cc of purulent material. 2. Successful ultrasound and CT-guided placement of a 12 French percutaneous drainage catheter into dominant abscess within the left lobe of the liver yielding 25 cc of purulent fluid. 3. A representative sample of aspirated purulent fluid was capped and sent to the laboratory for analysis. 4. Additional ill-defined collections within the anterior segment of the right lobe of the liver were not identified sonographically and thus not targeted. Electronically Signed   By: Sandi Mariscal M.D.   On: 12/11/2020 16:33   CT IMAGE GUIDED DRAINAGE BY PERCUTANEOUS CATHETER  Result Date: 12/11/2020 INDICATION: Fever and chills with  abdominal CT demonstrating findings worrisome for hepatic abscesses. Please perform image guided biopsy/aspiration and/or drainage catheter placement for diagnostic and therapeutic purposes. EXAM: ULTRASOUND AND CT-GUIDED HEPATIC ABSCESS DRAINAGE CATHETER PLACEMENT X2 COMPARISON:  CT abdomen and pelvis - 12/10/2020 MEDICATIONS: The patient is currently admitted to the  hospital and receiving intravenous antibiotics. The antibiotics were administered within an appropriate time frame prior to the initiation of the procedure. ANESTHESIA/SEDATION: Moderate (conscious) sedation was employed during this procedure. A total of Versed 2 mg and Fentanyl 75 mcg was administered intravenously. Moderate Sedation Time: 38 minutes. The patient's level of consciousness and vital signs were monitored continuously by radiology nursing throughout the procedure under my direct supervision. CONTRAST:  None COMPLICATIONS: None immediate. PROCEDURE: Informed written consent was obtained from the patient after a discussion of the risks, benefits and alternatives to treatment. The patient was placed supine on the CT gantry and a pre procedural CT was performed re-demonstrating the known indeterminate hypoattenuating masses/collections with dominant collection within the posterior segment of the right lobe of the liver measuring at least 7.9 x 5.8 cm (image 33, series 2 and additional collection within the posterior aspect the left lobe of the liver measuring approximately 4.8 x 3.5 cm (image 41, series 2). Sonographic evaluation was performed of the anterior segment of the right lobe of the liver however failed to definitively replicate the two small hypoattenuating lesions/collections demonstrated on preceding abdominal CT. The table position was marked and the collections were identified sonographically. The procedures were planned. A timeout was performed prior to the initiation of the procedure. The operative sites were prepped and draped in usual sterile fashion. Under direct ultrasound guidance, the dominant collection within the posterior aspect the right lobe of the liver was accessed with a 17 gauge trocar needle. Multiple ultrasound images were saved for procedural documentation purposes. Next, a small amount of purulent fluid was aspirated from the coaxial needle confirming and hepatic abscess.  As such, a short Amplatz wire was coiled within the collection. The identical procedure was repeated for the additional ill-defined lesion within the left lobe of the liver also yielding a return of a small amount of purulent fluid. Appropriate positioning was confirmed with CT imaging. Next, the tracks were serially dilated allowing placement of 12 French percutaneous drainage catheters. Postprocedural imaging demonstrates appropriate positioning of the drainage catheter within the posterior aspect the right lobe of the liver with malpositioning of the left percutaneous drainage catheter. As such, the collection within the left lobe of the liver was re-targeted with ultrasound ultimately allowing successful placement of a 12 French percutaneous drainage catheter. Appropriate position was confirmed with CT imaging (series 5). Approximately 80 cc of purulent material was aspirated from the drainage catheter within the right lobe of the liver while approximately 25 cc of purulent fluid was aspirated from the left-sided percutaneous drainage catheter. A representative sample of aspirated fluid was capped and sent to the laboratory for analysis. Both drainage catheters were flushed with saline and connected to JP bulbs. Both drainage catheters were secured to the skin entrance site within interrupted sutures and StatLock devices. Dressings were applied. The patient tolerated both procedures well without immediate postprocedural complication. IMPRESSION: 1. Successful ultrasound and CT-guided placement of 12 French percutaneous drainage catheter into dominant abscess within the posterior segment of the right lobe of the liver yielding 80 cc of purulent material. 2. Successful ultrasound and CT-guided placement of a 12 French percutaneous drainage catheter into dominant abscess within the left lobe of the  liver yielding 25 cc of purulent fluid. 3. A representative sample of aspirated purulent fluid was capped and sent to  the laboratory for analysis. 4. Additional ill-defined collections within the anterior segment of the right lobe of the liver were not identified sonographically and thus not targeted. Electronically Signed   By: Sandi Mariscal M.D.   On: 12/11/2020 16:33    Labs:  CBC: Recent Labs    12/10/20 1815 12/11/20 0330 12/12/20 0607 12/12/20 0805  WBC 16.7* 16.6* 19.8* 18.3*  HGB 9.0* 9.3* 7.5* 7.6*  HCT 27.8* 28.9* 23.1* 23.0*  PLT 259 248 212 212    COAGS: Recent Labs    12/11/20 0330 12/12/20 0805  INR 1.4* 1.5*    BMP: Recent Labs    12/04/20 1334 12/10/20 1815 12/11/20 0330 12/12/20 0607  NA 129* 134* 130* 131*  K 3.6 3.6 3.6 4.2  CL 87* 89* 89* 90*  CO2 29 32 27 23  GLUCOSE 341* 319* 322* 330*  BUN 29* 18 25* 41*  CALCIUM 8.4* 8.7* 8.2* 8.2*  CREATININE 6.82* 4.09* 4.73* 7.40*  GFRNONAA 8* 15* 13* 7*    LIVER FUNCTION TESTS: Recent Labs    12/04/20 1334 12/10/20 1815 12/11/20 0330 12/12/20 0607  BILITOT 0.7 0.8 0.8 1.5*  AST 102* 79* 43* 134*  ALT 92* 81* 67* 99*  ALKPHOS 87 140* 129* 96  PROT 6.7 7.5 7.0 5.6*  ALBUMIN 2.4* 2.5* 2.4* 1.8*    Assessment and Plan:  Hepatic abscess s/p drain placements 12/11/20: Patient is afebrile. WBC 18.3, Hgb 7.6. Drain #1 (RUQ lateral drain) with 35 ml output documented. Drain #2 (RUQ medial) with 120 ml output documented.   Continue current drain care orders: flush each drain each shift and document output. Change the dressings daily or as needed. Other plans per primary teams. IR will continue to follow.   Electronically Signed: Soyla Dryer, AGACNP-BC [phone] 12/12/2020, 2:34 PM   I spent a total of 15 Minutes at the the patient's bedside AND on the patient's hospital floor or unit, greater than 50% of which was counseling/coordinating care for hepatic abscess drains.

## 2020-12-12 NOTE — Progress Notes (Signed)
[facility] Progress Note  Patient: [name]    DEY:[id number]  DOA: 12/10/2020     Date of Service: the patient was seen and examined on 12/12/2020  Brief hospital course: Past medical history of ESRD on HD MWF, HTN, HFpEF, CAD SP CABG, type II DM.  Presents with complaints of abdominal pain and leukocytosis.  Found to have liver abscess. S/P IR guided drain placement and biopsy on 6/30  Currently plan is monitor cultures and cytology, treat hypertension, monitor H&H.  Subjective: Denies any acute complaints other than pain where the drain is.  No nausea no vomiting.  No chest pain.  No abdominal pain.  No dizziness or lightheadedness.  Reports fatigue and tiredness.  Passing gas but no BM.  Assessment and Plan: 1.  Liver lesions suspected abscess SP IR ultrasound guided drain placement. Follow-up on cultures and cytology. Currently on IV vancomycin and Zosyn.  Transition to IV Zosyn only. Gram-negative rods seen on the culture.  2.  Hypotensive episode Blood pressure dropped significantly on 7/1. Hemoglobin dropped 2 g as well. CT abdomen was performed due to recent procedure.  No acute bleeding. Blood pressure improved with midodrine. Hold antihypertensive agent.  Midodrine with HD.  3.  ESRD on HD MWF Nephrology consulted. Appreciate assistance.  4.  Constipation. Bowel regimen.  5.  CAD SP CABG HTN Aspirin on hold. Was on Coreg.  Now on hold. Lipitor on hold as well.  6.  Chronic ataxia CT scan, MRI negative for any acute stroke but TSH normal. B12 normal. As needed meclizine pretension PT OT consulted.  Scheduled Meds:  Chlorhexidine Gluconate Cloth  6 each Topical Q0600   midodrine  10 mg Oral Q M,W,F   sevelamer carbonate  800 mg Oral TID WC   sodium chloride flush  5 mL Intracatheter Q8H   Continuous Infusions:  sodium chloride     sodium chloride     piperacillin-tazobactam (ZOSYN)  IV 3.375 g (12/12/20 0540)   PRN Meds: sodium chloride,  sodium chloride, acetaminophen **OR** acetaminophen, alteplase, fentaNYL (SUBLIMAZE) injection, heparin, lidocaine (PF), lidocaine-prilocaine, ondansetron (ZOFRAN) IV, oxyCODONE, pentafluoroprop-tetrafluoroeth, polyethylene glycol  Body mass index is 23.06 kg/m.        DVT Prophylaxis:   SCDs Start: 12/11/20 0112    Advance goals of care discussion: Pt is Full code.  Family Communication: no family was present at bedside, at the time of interview.   Data Reviewed: I have personally reviewed and interpreted daily labs, tele strips, imaging. Mild hyponatremia.  Platelets stable.  Phosphorus 5.1.  Hypoalbuminemia.  LFT elevated and trending up. Iron level significantly low.  B12 normal.  Reticulocyte count normal. Hemoglobin 7.5-7.7.  Prior value of 9.0 24 hours ago. CT abdomen shows decrease in size of the abscess where the drain is placed.  No evidence of acute bleeding.  Physical Exam:  General: Appear in mild distress, no Rash; Oral Mucosa Clear, moist. no Abnormal Neck Mass Or lumps, Conjunctiva normal  Cardiovascular: S1 and S2 Present, aortic systolic  Murmur, Respiratory: good respiratory effort, Bilateral Air entry present and CTA, no Crackles, no wheezes Abdomen: Bowel Sound present, Soft and no tenderness Extremities: no Pedal edema Neurology: alert and oriented to time, place, and person affect appropriate. no new focal deficit Gait not checked due to patient safety concerns  Vitals:   12/12/20 1500 12/12/20 1530 12/12/20 1600 12/12/20 1630  BP: (!) 110/52 (!) 121/56 (!) 113/58 (!) 102/50  Pulse: 69 71 73 73  Resp:  11 (!)  23 (!) 24  Temp:      TempSrc:      SpO2:      Weight:      Height:        Disposition:  Status is: Inpatient  Remains inpatient appropriate because:IV treatments appropriate due to intensity of illness or inability to take PO  Dispo: The patient is from: Home              Anticipated d/c is to: SNF              Patient currently is not  medically stable to d/c.   Difficult to place patient No        Time spent: 35 minutes. I reviewed all nursing notes, pharmacy notes, vitals, pertinent old records. I have discussed plan of care as described above with RN.  Author: Berle Mull, MD [facility] 12/12/2020 4:51 PM  To reach On-call, see care teams to locate the attending and reach out via [email or web address]. Between 7PM-7AM, please contact night-coverage If you still have difficulty reaching the attending provider, please page the [facility] (Director on Call) for [facility] on amion for assistance.

## 2020-12-12 NOTE — Consult Note (Signed)
[facility] Nephrology Consult Note: Reason for Consult: To manage dialysis and dialysis related needs Referring Physician: Dr Posey Pronto, Diamantina [facility]  HPI:  [name] is an 70 y.o. male with past medical history of HTN, DM, CAD status post CABG, diastolic CHF, ESRD on HD MWF at [facility] presented with abdominal pain, nausea vomiting seen as a consultation for the management of ESRD. Patient was recently hospitalized last month for ataxia.  The imaging studies were unremarkable and there was concern about vestibular abnormalities. For dialysis, patient goes to [facility] in [place] Monday Wednesday Friday.  He has left upper extremity fistula for the access.  Reportedly the last dialysis was on Monday where he developed hypotensive episode, mostly intradialytic hypotension.  His dry weight is 87 kg. In the ER, hemodynamically stable however he was found to have leukocytosis.  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  He was a started on broad-spectrum antibiotics and transferred to [facility] from [facility] per IR drainage. He underwent CT-guided drainage of right lobe of the liver with 25 cc of purulent fluid.  He has JP drain.  He is currently on vancomycin and IV Zosyn. Patient reports pain/discomfort around right upper quadrant.  He has some nausea but denied vomiting.  Blood pressure was low this morning but improved after a dose of midodrine.  He denies chest pain, shortness of breath, headache or dizziness.  Past Medical History:  Diagnosis Date   Arthritis    CAD (coronary artery disease)    STEMI with LAD stent by [facility] in 2005   Dependence on renal dialysis ([place])    Depression    Diabetes mellitus without complication ([place])    History of kidney stones    Hypertension    Kidney infection    MI (mitral incompetence)    Myocardial infarction ([place])    Seasonal allergies     Past Surgical History:  Procedure  Laterality Date   AV FISTULA PLACEMENT Left 01/30/2014   Procedure: INSERTION OF ARTERIOVENOUS (AV) GORE-TEX GRAFT LEFT UPPER ARM;  Surgeon: Elam Dutch, MD;  Location: [place];  Service: Vascular;  Laterality: Left;   BACK SURGERY     CARDIAC CATHETERIZATION N/A 04/01/2015   Procedure: Right/Left Heart Cath and Coronary Angiography;  Surgeon: Leonie Man, MD;  Location: [place] CV [facility];  Service: Cardiovascular;  Laterality: N/A;   COLONOSCOPY N/A 10/12/2016   Procedure: COLONOSCOPY;  Surgeon: Daneil Dolin, MD;  Location: [facility];  Service: Endoscopy;  Laterality: N/A;  2:00pm   COLONOSCOPY W/ POLYPECTOMY     CORONARY ANGIOPLASTY WITH STENT PLACEMENT  2008   CORONARY ARTERY BYPASS GRAFT N/A 04/03/2015   Procedure: CORONARY ARTERY BYPASS GRAFTING (CABG) x  4 (LIMA to LAD, SVG to DIAGONAL, SVG to OM1, and SVG to RCA) with EVH from right greater saphenous thigh and partial lower leg vein ;  Surgeon: Ivin Poot, MD;  Location: [place];  Service: Open Heart Surgery;  Laterality: N/A;   EYE SURGERY Right    lens implant   INSERTION OF DIALYSIS CATHETER Right 01/30/2014   Procedure: INSERTION OF DIALYSIS CATHETER-RIGHT INTERNAL JUGULAR;  Surgeon: Elam Dutch, MD;  Location: [place];  Service: Vascular;  Laterality: Right;   POLYPECTOMY  10/12/2016   Procedure: POLYPECTOMY;  Surgeon: Daneil Dolin, MD;  Location[facility];  Service: Endoscopy;;  colon   SPINE SURGERY     TEE WITHOUT CARDIOVERSION N/A 04/03/2015  Procedure: TRANSESOPHAGEAL ECHOCARDIOGRAM (TEE);  Surgeon: Ivin Poot, MD;  Location: [place];  Service: Open Heart Surgery;  Laterality: N/A;    Family History  Problem Relation Age of Onset   Diabetes Mother    Hypertension Mother    Diabetes Father    Hypertension Father    Diabetes Other     Social History:  reports that he has never smoked. He quit smokeless tobacco use about 10 years ago.  His smokeless tobacco use included chew. He reports that he  does not drink alcohol and does not use drugs.  Allergies:  Allergies  Allergen Reactions   Shellfish Allergy Hives    Medications: I have reviewed the patient's current medications.   Results for orders placed or performed during the hospital encounter of 12/10/20 (from the past 48 hour(s))  CBC with Differential     Status: Abnormal   Collection Time: 12/10/20  6:15 PM  Result Value Ref Range   WBC 16.7 (H) 4.0 - 10.5 K/uL   RBC 2.92 (L) 4.22 - 5.81 MIL/uL   Hemoglobin 9.0 (L) 13.0 - 17.0 g/dL   HCT 27.8 (L) 39.0 - 52.0 %   MCV 95.2 80.0 - 100.0 fL   MCH 30.8 26.0 - 34.0 pg   MCHC 32.4 30.0 - 36.0 g/dL   RDW 14.3 11.5 - 15.5 %   Platelets 259 150 - 400 K/uL   nRBC 0.0 0.0 - 0.2 %   Neutrophils Relative % 90 %   Neutro Abs 15.0 (H) 1.7 - 7.7 K/uL   Lymphocytes Relative 4 %   Lymphs Abs 0.6 (L) 0.7 - 4.0 K/uL   Monocytes Relative 4 %   Monocytes Absolute 0.6 0.1 - 1.0 K/uL   Eosinophils Relative 1 %   Eosinophils Absolute 0.2 0.0 - 0.5 K/uL   Basophils Relative 0 %   Basophils Absolute 0.1 0.0 - 0.1 K/uL   Immature Granulocytes 1 %   Abs Immature Granulocytes 0.11 (H) 0.00 - 0.07 K/uL    Comment: Performed at [facility], [address] [place], [place] [address]  Comprehensive metabolic panel     Status: Abnormal   Collection Time: 12/10/20  6:15 PM  Result Value Ref Range   Sodium 134 (L) 135 - 145 mmol/L   Potassium 3.6 3.5 - 5.1 mmol/L   Chloride 89 (L) 98 - 111 mmol/L   CO2 32 22 - 32 mmol/L   Glucose, Bld 319 (H) 70 - 99 mg/dL    Comment: Glucose reference range applies only to samples taken after fasting for at least 8 hours.   BUN 18 8 - 23 mg/dL   Creatinine, Ser 4.09 (H) 0.61 - 1.24 mg/dL   Calcium 8.7 (L) 8.9 - 10.3 mg/dL   Total Protein 7.5 6.5 - 8.1 g/dL   Albumin 2.5 (L) 3.5 - 5.0 g/dL   AST 79 (H) 15 - 41 U/L   ALT 81 (H) 0 - 44 U/L   Alkaline Phosphatase 140 (H) 38 - 126 U/L   Total Bilirubin 0.8 0.3 - 1.2 mg/dL   GFR, Estimated 15 (L) >60  mL/min    Comment: (NOTE) Calculated using the CKD-EPI Creatinine Equation (2021)    Anion gap 13 5 - 15    Comment: Performed at [facility], [address] [place], [place] [address]  Lipase, blood     Status: Abnormal   Collection Time: 12/10/20  6:15 PM  Result Value Ref Range   Lipase 148 (H) 11 -  51 U/L    Comment: Performed at [facility], [address] [place], [place] [address]  Magnesium     Status: None   Collection Time: 12/10/20  6:16 PM  Result Value Ref Range   Magnesium 2.1 1.7 - 2.4 mg/dL    Comment: Performed at [facility], [address] [place], [place] [address]  Blood culture (routine x 2)     Status: None (Preliminary result)   Collection Time: 12/10/20  8:45 PM   Specimen: BLOOD RIGHT ARM  Result Value Ref Range   Specimen Description BLOOD RIGHT ARM    Special Requests      BOTTLES DRAWN AEROBIC AND ANAEROBIC Blood Culture results may not be optimal due to an inadequate volume of blood received in culture bottles   Culture      NO GROWTH < 24 HOURS Performed at [facility], [address], [place], [place] [address]    Report Status PENDING   Lactic acid, plasma     Status: None   Collection Time: 12/10/20  8:45 PM  Result Value Ref Range   Lactic Acid, Venous 1.6 0.5 - 1.9 mmol/L    Comment: Performed at [facility], [address], [place], [place] [address]  SARS CORONAVIRUS 2 (TAT 6-24 HRS) Nasopharyngeal Nasopharyngeal Swab     Status: None   Collection Time: 12/10/20  9:02 PM   Specimen: Nasopharyngeal Swab  Result Value Ref Range   SARS Coronavirus 2 NEGATIVE NEGATIVE    Comment: (NOTE) SARS-CoV-2 target nucleic acids are NOT DETECTED.  The SARS-CoV-2 RNA is generally detectable in upper and lower respiratory specimens during the acute phase of infection. Negative results do not preclude SARS-CoV-2 infection, do not rule out co-infections with other pathogens, and should not be used as the sole basis for treatment or other patient  management decisions. Negative results must be combined with clinical observations, patient history, and epidemiological information. The expected result is Negative.  Fact Sheet for Patients: SugarRoll.be  Fact Sheet for Healthcare Providers: https://www.woods-mathews.com/  This test is not yet approved or cleared by the [place] FDA and  has been authorized for detection and/or diagnosis of SARS-CoV-2 by FDA under an Emergency Use Authorization (EUA). This EUA will remain  in effect (meaning this test can be used) for the duration of the COVID-19 declaration under Se ction 564(b)(1) of the Act, 21 U.S.C. section 360bbb-3(b)(1), unless the authorization is terminated or revoked sooner.  Performed at [facility], [place] [address], [place], [place] [address]   Blood culture (routine x 2)     Status: None (Preliminary result)   Collection Time: 12/10/20 10:03 PM   Specimen: BLOOD RIGHT HAND  Result Value Ref Range   Specimen Description BLOOD RIGHT HAND    Special Requests      BOTTLES DRAWN AEROBIC AND ANAEROBIC Blood Culture results may not be optimal due to an inadequate volume of blood received in culture bottles   Culture      NO GROWTH < 24 HOURS Performed at [facility], [address] [place],  [address]    Report Status PENDING   Comprehensive metabolic panel     Status: Abnormal   Collection Time: 12/11/20  3:30 AM  Result Value Ref Range   Sodium 130 (L) 135 - 145 mmol/L   Potassium 3.6 3.5 - 5.1 mmol/L   Chloride 89 (L) 98 - 111 mmol/L   CO2 27 22 - 32 mmol/L   Glucose, Bld 322 (  H) 70 - 99 mg/dL    Comment: Glucose reference range applies only to samples taken after fasting for at least 8 hours.   BUN 25 (H) 8 - 23 mg/dL   Creatinine, Ser 4.73 (H) 0.61 - 1.24 mg/dL   Calcium 8.2 (L) 8.9 - 10.3 mg/dL   Total Protein 7.0 6.5 - 8.1 g/dL   Albumin 2.4 (L) 3.5 - 5.0 g/dL   AST 43 (H) 15 - 41 U/L   ALT 67  (H) 0 - 44 U/L   Alkaline Phosphatase 129 (H) 38 - 126 U/L   Total Bilirubin 0.8 0.3 - 1.2 mg/dL   GFR, Estimated 13 (L) >60 mL/min    Comment: (NOTE) Calculated using the CKD-EPI Creatinine Equation (2021)    Anion gap 14 5 - 15    Comment: Performed at [facility], [address], [place], [place] [address]  CBC     Status: Abnormal   Collection Time: 12/11/20  3:30 AM  Result Value Ref Range   WBC 16.6 (H) 4.0 - 10.5 K/uL   RBC 2.99 (L) 4.22 - 5.81 MIL/uL   Hemoglobin 9.3 (L) 13.0 - 17.0 g/dL   HCT 28.9 (L) 39.0 - 52.0 %   MCV 96.7 80.0 - 100.0 fL   MCH 31.1 26.0 - 34.0 pg   MCHC 32.2 30.0 - 36.0 g/dL   RDW 14.3 11.5 - 15.5 %   Platelets 248 150 - 400 K/uL   nRBC 0.0 0.0 - 0.2 %    Comment: Performed at [facility], [address], [place], [place] [address]  Protime-INR     Status: Abnormal   Collection Time: 12/11/20  3:30 AM  Result Value Ref Range   Prothrombin Time 16.7 (H) 11.4 - 15.2 seconds   INR 1.4 (H) 0.8 - 1.2    Comment: (NOTE) INR goal varies based on device and disease states. Performed at [facility], [address] [place], [place] [address]   Blood culture (routine x 2)     Status: None (Preliminary result)   Collection Time: 12/11/20  9:09 AM   Specimen: BLOOD  Result Value Ref Range   Specimen Description BLOOD RIGHT HAND    Special Requests      BOTTLES DRAWN AEROBIC AND ANAEROBIC Blood Culture adequate volume   Culture      NO GROWTH < 12 HOURS Performed at [facility], [address], [place], [place] [address]    Report Status PENDING   Blood culture (routine x 2)     Status: None (Preliminary result)   Collection Time: 12/11/20  9:09 AM   Specimen: BLOOD  Result Value Ref Range   Specimen Description BLOOD RIGHT WRIST    Special Requests      BOTTLES DRAWN AEROBIC AND ANAEROBIC Blood Culture adequate volume   Culture      NO GROWTH < 12 HOURS Performed at [facility], [address], [place], [place] [address]    Report Status  PENDING   CBC with Differential/Platelet     Status: Abnormal   Collection Time: 12/12/20  6:07 AM  Result Value Ref Range   WBC 19.8 (H) 4.0 - 10.5 K/uL   RBC 2.42 (L) 4.22 - 5.81 MIL/uL   Hemoglobin 7.5 (L) 13.0 - 17.0 g/dL   HCT 23.1 (L) 39.0 - 52.0 %   MCV 95.5 80.0 - 100.0 fL   MCH 31.0 26.0 - 34.0 pg   MCHC 32.5 30.0 - 36.0 g/dL   RDW  14.5 11.5 - 15.5 %   Platelets 212 150 - 400 K/uL   nRBC 0.0 0.0 - 0.2 %   Neutrophils Relative % 92 %   Neutro Abs 18.0 (H) 1.7 - 7.7 K/uL   Lymphocytes Relative 4 %   Lymphs Abs 0.8 0.7 - 4.0 K/uL   Monocytes Relative 3 %   Monocytes Absolute 0.6 0.1 - 1.0 K/uL   Eosinophils Relative 0 %   Eosinophils Absolute 0.0 0.0 - 0.5 K/uL   Basophils Relative 0 %   Basophils Absolute 0.1 0.0 - 0.1 K/uL   Immature Granulocytes 1 %   Abs Immature Granulocytes 0.23 (H) 0.00 - 0.07 K/uL    Comment: Performed at [facility] [address], [place], [place] [address]  Comprehensive metabolic panel     Status: Abnormal   Collection Time: 12/12/20  6:07 AM  Result Value Ref Range   Sodium 131 (L) 135 - 145 mmol/L   Potassium 4.2 3.5 - 5.1 mmol/L   Chloride 90 (L) 98 - 111 mmol/L   CO2 23 22 - 32 mmol/L   Glucose, Bld 330 (H) 70 - 99 mg/dL    Comment: Glucose reference range applies only to samples taken after fasting for at least 8 hours.   BUN 41 (H) 8 - 23 mg/dL   Creatinine, Ser 7.40 (H) 0.61 - 1.24 mg/dL   Calcium 8.2 (L) 8.9 - 10.3 mg/dL   Total Protein 5.6 (L) 6.5 - 8.1 g/dL   Albumin 1.8 (L) 3.5 - 5.0 g/dL   AST 134 (H) 15 - 41 U/L   ALT 99 (H) 0 - 44 U/L   Alkaline Phosphatase 96 38 - 126 U/L   Total Bilirubin 1.5 (H) 0.3 - 1.2 mg/dL   GFR, Estimated 7 (L) >60 mL/min    Comment: (NOTE) Calculated using the CKD-EPI Creatinine Equation (2021)    Anion gap 18 (H) 5 - 15    Comment: Performed at [facility], [place] [address], [place], [place] [address]  Magnesium     Status: None   Collection Time: 12/12/20  6:07 AM  Result  Value Ref Range   Magnesium 2.1 1.7 - 2.4 mg/dL    Comment: Performed at [facility] [address] [place], [place] [address]  CBC     Status: Abnormal   Collection Time: 12/12/20  8:05 AM  Result Value Ref Range   WBC 18.3 (H) 4.0 - 10.5 K/uL   RBC 2.45 (L) 4.22 - 5.81 MIL/uL   Hemoglobin 7.6 (L) 13.0 - 17.0 g/dL   HCT 23.0 (L) 39.0 - 52.0 %   MCV 93.9 80.0 - 100.0 fL   MCH 31.0 26.0 - 34.0 pg   MCHC 33.0 30.0 - 36.0 g/dL   RDW 14.4 11.5 - 15.5 %   Platelets 212 150 - 400 K/uL   nRBC 0.0 0.0 - 0.2 %    Comment: Performed at [facility], [place] [address], [place], [place] [address]  Reticulocytes     Status: Abnormal   Collection Time: 12/12/20  8:05 AM  Result Value Ref Range   Retic Ct Pct 2.0 0.4 - 3.1 %   RBC. 2.46 (L) 4.22 - 5.81 MIL/uL   Retic Count, Absolute 48.2 19.0 - 186.0 K/uL   Immature Retic Fract 19.1 (H) 2.3 - 15.9 %    Comment: Performed at [facility] [address], [place], [place] [address]  Protime-INR  Status: Abnormal   Collection Time: 12/12/20  8:05 AM  Result Value Ref Range   Prothrombin Time 18.2 (H) 11.4 - 15.2 seconds   INR 1.5 (H) 0.8 - 1.2    Comment: (NOTE) INR goal varies based on device and disease states. Performed at [facility], [place] [address] [place], [place] [address]   Type and screen [place]     Status: None   Collection Time: 12/12/20  8:11 AM  Result Value Ref Range   ABO/RH(D) B POS    Antibody Screen NEG    Sample Expiration      12/15/2020,2359 Performed at [facility], [place] [address] [place], [place] [address]     CT ABDOMEN PELVIS W CONTRAST  Result Date: 12/10/2020 CLINICAL DATA:  Abdominal pain, fever, vomiting EXAM: CT ABDOMEN AND PELVIS WITH CONTRAST TECHNIQUE: Multidetector CT imaging of the abdomen and pelvis was performed using the standard protocol following bolus administration of intravenous contrast. CONTRAST:  146mL OMNIPAQUE IOHEXOL 300 MG/ML   SOLN COMPARISON:  None. FINDINGS: Lower chest: Small right pleural effusion. Atelectasis in the lower lobes. Diffuse coronary artery calcifications. Scattered aortic calcifications. Hepatobiliary: Numerous irregular low-density masses throughout the liver. Largest is in the posterior right hepatic lobe measuring up to 7.4 cm. Largest area in the left hepatic lobe measures up to 5 cm. Other smaller low-density irregular lesions throughout the liver. These could reflect necrotic masses or abscesses. Gallbladder unremarkable. No biliary ductal dilatation. Pancreas: No focal abnormality or ductal dilatation. Spleen: No focal abnormality.  Normal size. Adrenals/Urinary Tract: Numerous bilateral renal cysts. Kidneys are atrophic. No hydronephrosis. Adrenal glands and urinary bladder unremarkable. Stomach/Bowel: Normal appendix. Stomach, large and small bowel grossly unremarkable. Vascular/Lymphatic: Heavily calcified aorta, iliac vessels and branch vessels. No evidence of aneurysm or adenopathy. Reproductive: Prostate enlargement. Other: No free fluid or free air. Musculoskeletal: No acute bony abnormality. IMPRESSION: Numerous irregular low-density lesions throughout the liver, the largest in the posterior right hepatic lobe measuring 7.4 cm. These could reflect necrotic metastases or abscesses. Given the patient's history and appearance, abscesses are favored. Aortoiliac atherosclerosis. Numerous low-density lesions throughout the kidneys, likely cysts. Prostate enlargement. These results were called by telephone at the time of interpretation on 12/10/2020 at 7:48 pm to provider Dr. Roderic Palau, who verbally acknowledged these results. Electronically Signed   By: Rolm Baptise M.D.   On: 12/10/2020 19:50   CT IMAGE GUIDED DRAINAGE BY PERCUTANEOUS CATHETER  Result Date: 12/11/2020 INDICATION: Fever and chills with abdominal CT demonstrating findings worrisome for hepatic abscesses. Please perform image guided  biopsy/aspiration and/or drainage catheter placement for diagnostic and therapeutic purposes. EXAM: ULTRASOUND AND CT-GUIDED HEPATIC ABSCESS DRAINAGE CATHETER PLACEMENT X2 COMPARISON:  CT abdomen and pelvis - 12/10/2020 MEDICATIONS: The patient is currently admitted to the hospital and receiving intravenous antibiotics. The antibiotics were administered within an appropriate time frame prior to the initiation of the procedure. ANESTHESIA/SEDATION: Moderate (conscious) sedation was employed during this procedure. A total of Versed 2 mg and Fentanyl 75 mcg was administered intravenously. Moderate Sedation Time: 38 minutes. The patient's level of consciousness and vital signs were monitored continuously by radiology nursing throughout the procedure under my direct supervision. CONTRAST:  None COMPLICATIONS: None immediate. PROCEDURE: Informed written consent was obtained from the patient after a discussion of the risks, benefits and alternatives to treatment. The patient was placed supine on the CT gantry and a pre procedural CT was performed re-demonstrating the known indeterminate hypoattenuating masses/collections with dominant  collection within the posterior segment of the right lobe of the liver measuring at least 7.9 x 5.8 cm (image 33, series 2 and additional collection within the posterior aspect the left lobe of the liver measuring approximately 4.8 x 3.5 cm (image 41, series 2). Sonographic evaluation was performed of the anterior segment of the right lobe of the liver however failed to definitively replicate the two small hypoattenuating lesions/collections demonstrated on preceding abdominal CT. The table position was marked and the collections were identified sonographically. The procedures were planned. A timeout was performed prior to the initiation of the procedure. The operative sites were prepped and draped in usual sterile fashion. Under direct ultrasound guidance, the dominant collection within the  posterior aspect the right lobe of the liver was accessed with a 17 gauge trocar needle. Multiple ultrasound images were saved for procedural documentation purposes. Next, a small amount of purulent fluid was aspirated from the coaxial needle confirming and hepatic abscess. As such, a short Amplatz wire was coiled within the collection. The identical procedure was repeated for the additional ill-defined lesion within the left lobe of the liver also yielding a return of a small amount of purulent fluid. Appropriate positioning was confirmed with CT imaging. Next, the tracks were serially dilated allowing placement of 12 French percutaneous drainage catheters. Postprocedural imaging demonstrates appropriate positioning of the drainage catheter within the posterior aspect the right lobe of the liver with malpositioning of the left percutaneous drainage catheter. As such, the collection within the left lobe of the liver was re-targeted with ultrasound ultimately allowing successful placement of a 12 French percutaneous drainage catheter. Appropriate position was confirmed with CT imaging (series 5). Approximately 80 cc of purulent material was aspirated from the drainage catheter within the right lobe of the liver while approximately 25 cc of purulent fluid was aspirated from the left-sided percutaneous drainage catheter. A representative sample of aspirated fluid was capped and sent to the laboratory for analysis. Both drainage catheters were flushed with saline and connected to JP bulbs. Both drainage catheters were secured to the skin entrance site within interrupted sutures and StatLock devices. Dressings were applied. The patient tolerated both procedures well without immediate postprocedural complication. IMPRESSION: 1. Successful ultrasound and CT-guided placement of 12 French percutaneous drainage catheter into dominant abscess within the posterior segment of the right lobe of the liver yielding 80 cc of  purulent material. 2. Successful ultrasound and CT-guided placement of a 12 French percutaneous drainage catheter into dominant abscess within the left lobe of the liver yielding 25 cc of purulent fluid. 3. A representative sample of aspirated purulent fluid was capped and sent to the laboratory for analysis. 4. Additional ill-defined collections within the anterior segment of the right lobe of the liver were not identified sonographically and thus not targeted. Electronically Signed   By: Sandi Mariscal M.D.   On: 12/11/2020 16:33   CT IMAGE GUIDED DRAINAGE BY PERCUTANEOUS CATHETER  Result Date: 12/11/2020 INDICATION: Fever and chills with abdominal CT demonstrating findings worrisome for hepatic abscesses. Please perform image guided biopsy/aspiration and/or drainage catheter placement for diagnostic and therapeutic purposes. EXAM: ULTRASOUND AND CT-GUIDED HEPATIC ABSCESS DRAINAGE CATHETER PLACEMENT X2 COMPARISON:  CT abdomen and pelvis - 12/10/2020 MEDICATIONS: The patient is currently admitted to the hospital and receiving intravenous antibiotics. The antibiotics were administered within an appropriate time frame prior to the initiation of the procedure. ANESTHESIA/SEDATION: Moderate (conscious) sedation was employed during this procedure. A total of Versed 2 mg and Fentanyl 75 mcg  was administered intravenously. Moderate Sedation Time: 38 minutes. The patient's level of consciousness and vital signs were monitored continuously by radiology nursing throughout the procedure under my direct supervision. CONTRAST:  None COMPLICATIONS: None immediate. PROCEDURE: Informed written consent was obtained from the patient after a discussion of the risks, benefits and alternatives to treatment. The patient was placed supine on the CT gantry and a pre procedural CT was performed re-demonstrating the known indeterminate hypoattenuating masses/collections with dominant collection within the posterior segment of the right  lobe of the liver measuring at least 7.9 x 5.8 cm (image 33, series 2 and additional collection within the posterior aspect the left lobe of the liver measuring approximately 4.8 x 3.5 cm (image 41, series 2). Sonographic evaluation was performed of the anterior segment of the right lobe of the liver however failed to definitively replicate the two small hypoattenuating lesions/collections demonstrated on preceding abdominal CT. The table position was marked and the collections were identified sonographically. The procedures were planned. A timeout was performed prior to the initiation of the procedure. The operative sites were prepped and draped in usual sterile fashion. Under direct ultrasound guidance, the dominant collection within the posterior aspect the right lobe of the liver was accessed with a 17 gauge trocar needle. Multiple ultrasound images were saved for procedural documentation purposes. Next, a small amount of purulent fluid was aspirated from the coaxial needle confirming and hepatic abscess. As such, a short Amplatz wire was coiled within the collection. The identical procedure was repeated for the additional ill-defined lesion within the left lobe of the liver also yielding a return of a small amount of purulent fluid. Appropriate positioning was confirmed with CT imaging. Next, the tracks were serially dilated allowing placement of 12 French percutaneous drainage catheters. Postprocedural imaging demonstrates appropriate positioning of the drainage catheter within the posterior aspect the right lobe of the liver with malpositioning of the left percutaneous drainage catheter. As such, the collection within the left lobe of the liver was re-targeted with ultrasound ultimately allowing successful placement of a 12 French percutaneous drainage catheter. Appropriate position was confirmed with CT imaging (series 5). Approximately 80 cc of purulent material was aspirated from the drainage catheter  within the right lobe of the liver while approximately 25 cc of purulent fluid was aspirated from the left-sided percutaneous drainage catheter. A representative sample of aspirated fluid was capped and sent to the laboratory for analysis. Both drainage catheters were flushed with saline and connected to JP bulbs. Both drainage catheters were secured to the skin entrance site within interrupted sutures and StatLock devices. Dressings were applied. The patient tolerated both procedures well without immediate postprocedural complication. IMPRESSION: 1. Successful ultrasound and CT-guided placement of 12 French percutaneous drainage catheter into dominant abscess within the posterior segment of the right lobe of the liver yielding 80 cc of purulent material. 2. Successful ultrasound and CT-guided placement of a 12 French percutaneous drainage catheter into dominant abscess within the left lobe of the liver yielding 25 cc of purulent fluid. 3. A representative sample of aspirated purulent fluid was capped and sent to the laboratory for analysis. 4. Additional ill-defined collections within the anterior segment of the right lobe of the liver were not identified sonographically and thus not targeted. Electronically Signed   By: Sandi Mariscal M.D.   On: 12/11/2020 16:33    ROS: As per H&P, rest of the systems are reviewed and negative. Blood pressure (!) 107/49, pulse 74, temperature 97.8 F (36.6 C), temperature  source Oral, resp. rate 20, height 6\' 5"  (1.956 m), weight 81.6 kg, SpO2 96 %. Gen: NAD, comfortable Respiratory: Clear bilateral, no wheezing or crackle Cardiovascular: Regular rate rhythm S1-S2 normal, no rubs GI: Abdomen soft, nontender, nondistended Extremities, no cyanosis or clubbing, no edema Skin: No rash or ulcer Neurology: Alert, awake, following commands, oriented Dialysis Access:  Assessment/Plan:  #Liver abscess concerning for possible metastatic necrosis: Status post CT-guided drainage  of purulent fluid by IR on 6/30.  Currently on broad-spectrum antibiotics with vancomycin and Zosyn.  Further plan to IR and primary team.  # ESRD: MWF at [facility] in [place].  Potassium level and volume status is acceptable.  I will lower UF goal to only 0.5 L if tolerated by BP.  # Hypertension: Blood pressure is low therefore holding all antihypertensive medication.  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  UF goal reduced.  Monitor BP.  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  No IV iron because of infection/sepsis.  I will hold ESA because of concern for liver mass.  # Metabolic Bone Disease: Continue Renvela.  Check phosphorus level.  Thank you for the consult.  We will follow with you.  Nguyet Mercer Tanna Furry 12/12/2020, 10:33 AM

## 2020-12-13 LAB — BASIC METABOLIC PANEL
Anion gap: 18 — ABNORMAL HIGH (ref 5–15)
BUN: 26 mg/dL — ABNORMAL HIGH (ref 8–23)
CO2: 25 mmol/L (ref 22–32)
Calcium: 8.9 mg/dL (ref 8.9–10.3)
Chloride: 91 mmol/L — ABNORMAL LOW (ref 98–111)
Creatinine, Ser: 4.77 mg/dL — ABNORMAL HIGH (ref 0.61–1.24)
GFR, Estimated: 12 mL/min — ABNORMAL LOW (ref >60)
Glucose, Bld: 216 mg/dL — ABNORMAL HIGH (ref 70–99)
Potassium: 3.9 mmol/L (ref 3.5–5.1)
Sodium: 134 mmol/L — ABNORMAL LOW (ref 135–145)

## 2020-12-13 LAB — CBC
HCT: 25.8 % — ABNORMAL LOW (ref 39.0–52.0)
Hemoglobin: 8.2 g/dL — ABNORMAL LOW (ref 13.0–17.0)
MCH: 30.3 pg (ref 26.0–34.0)
MCHC: 31.8 g/dL (ref 30.0–36.0)
MCV: 95.2 fL (ref 80.0–100.0)
Platelets: 269 10*3/uL (ref 150–400)
RBC: 2.71 MIL/uL — ABNORMAL LOW (ref 4.22–5.81)
RDW: 14.8 % (ref 11.5–15.5)
WBC: 14.7 10*3/uL — ABNORMAL HIGH (ref 4.0–10.5)
nRBC: 0 % (ref 0.0–0.2)

## 2020-12-13 LAB — MAGNESIUM: Magnesium: 2.1 mg/dL (ref 1.7–2.4)

## 2020-12-13 NOTE — Progress Notes (Signed)
Referring Physician(s): Dr Marlowe Sax  Supervising Physician: Jacqulynn Cadet  Patient Status:  [facility] - In-pt  Chief Complaint:  Hepatic abscess drains placed in IR 6/30  Subjective:  Pt feeling better Up in bed OP from drains is cloudy yellow- copious still  Allergies: Shellfish allergy  Medications: Prior to Admission medications   Medication Sig Start Date End Date Taking? Authorizing Provider  acetaminophen (TYLENOL) 500 MG tablet Take 500 mg by mouth every 6 (six) hours as needed for mild pain or moderate pain.   Yes [provider]  albuterol (PROVENTIL HFA;VENTOLIN HFA) 108 (90 Base) MCG/ACT inhaler Inhale 2 puffs into the lungs every 6 (six) hours as needed. 07/29/18  Yes Rancour, Annie Main, MD  amLODipine (NORVASC) 10 MG tablet Take 1 tablet (10 mg total) by mouth daily. 11/17/20  Yes Barton Dubois, MD  carvedilol (COREG) 25 MG tablet Take 1 tablet (25 mg total) by mouth 2 (two) times daily. 11/17/20  Yes Barton Dubois, MD  cloNIDine (CATAPRES) 0.2 MG tablet Take 1 tablet (0.2 mg total) by mouth 2 (two) times daily. 11/17/20  Yes Barton Dubois, MD  diphenhydramine-acetaminophen (TYLENOL PM) 25-500 MG TABS tablet Take 1 tablet by mouth at bedtime as needed (sleep).   Yes [provider]  insulin glargine (LANTUS) 100 UNIT/ML injection Inject 10-20 Units into the skin at bedtime.   Yes [provider]  latanoprost (XALATAN) 0.005 % ophthalmic solution Place 2 drops into both eyes 2 (two) times daily. 08/27/16  Yes [provider]  magnesium hydroxide (MILK OF MAGNESIA) 400 MG/5ML suspension Take 15 mLs by mouth daily as needed for mild constipation.   Yes [provider]  meclizine (ANTIVERT) 25 MG tablet Take 1 tablet (25 mg total) by mouth 3 (three) times daily as needed for dizziness. 11/17/20  Yes Barton Dubois, MD  minoxidil (LONITEN) 10 MG tablet Take 0.5 tablets (5 mg total) by mouth at bedtime. Patient does not take as  prescribed. Sometimes takes a 1/2 tablet depending on BP readings Patient taking differently: Take 10 mg by mouth at bedtime. 11/17/20  Yes Barton Dubois, MD  Multiple Vitamin (DAILY VITAMIN PO) Take 1 tablet by mouth daily as needed (supplement).   Yes [provider]  NITROSTAT 0.4 MG SL tablet Place 0.4 mg under the tongue every 5 (five) minutes as needed for chest pain.  01/23/15  Yes [provider]  Omega-3 Fatty Acids (FISH OIL) 1000 MG CAPS Take 1,000 mg by mouth daily.   Yes [provider]  OVER THE COUNTER MEDICATION Take 1 tablet by mouth daily. Beet Root   Yes [provider]  sildenafil (VIAGRA) 50 MG tablet Take 50 mg by mouth as needed for erectile dysfunction.   Yes [provider]  aspirin EC 325 MG tablet Take 325mg s once daily on non dialysis days. Dialysis days are Mon, Wed, Fri Patient not taking: Reported on 12/04/2020    [provider]  atorvastatin (LIPITOR) 80 MG tablet Take 1 tablet (80 mg total) by mouth daily at 6 PM. Patient not taking: No sig reported 04/10/15   Nani Skillern, PA-C  cloNIDine (CATAPRES) 0.2 MG tablet 1 tablet Patient not taking: Reported on 12/10/2020    [provider]  minoxidil (LONITEN) 10 MG tablet 0.5 tablet Patient not taking: Reported on 12/10/2020    [provider]  polyethylene glycol-electrolytes (TRILYTE) 420 g solution Take 4,000 mLs by mouth as directed. Patient not taking: No sig reported 09/14/16  Daneil Dolin, MD  sevelamer carbonate (RENVELA) 800 MG tablet 1 tablet with meals Patient not taking: Reported on 12/10/2020    [provider]  zolpidem (AMBIEN) 10 MG tablet Take 10 mg by mouth at bedtime as needed for sleep.  Patient not taking: Reported on 12/10/2020 05/29/16   [provider]     Vital Signs: BP 134/68 (BP Location: Right Arm)   Pulse 73   Temp 98.1 F (36.7 C) (Oral)   Resp 20   Ht 6\' 5"  (1.956 m)   Wt 194 lb 7.1  oz (88.2 kg)   SpO2 96%   BMI 23.06 kg/m   Physical Exam Vitals reviewed.  Skin:    General: Skin is warm.     Comments: Sites of drains are clean and dry Sl tender at both No bleeding No sign of infection Op cloudy yellow 100 cc and 135 cc OP each  NGTD    Imaging: CT ABDOMEN PELVIS W CONTRAST  Result Date: 12/12/2020 CLINICAL DATA:  Liver lesions.  Status post drain placement. EXAM: CT ABDOMEN AND PELVIS WITH CONTRAST TECHNIQUE: Multidetector CT imaging of the abdomen and pelvis was performed using the standard protocol following bolus administration of intravenous contrast. CONTRAST:  151mL OMNIPAQUE IOHEXOL 300 MG/ML  SOLN COMPARISON:  December 10, 2020. FINDINGS: Lower chest: Bilateral pleural effusions are noted with adjacent subsegmental atelectasis, right greater than left. Hepatobiliary: Status post interval placement of percutaneous drainage catheter is seen involving the abscesses in the left hepatic lobe and posterior segment of right hepatic lobe. Left hepatic abscess measures 4.6 x 3.9 cm currently which is decreased compared to prior exam. Right posterior abscess measures 7.0 x 5.1 cm which is decreased compared to prior exam. Multiple smaller other low densities or abscess are noted which are not significantly changed. No gallstones or biliary dilatation is noted. Pancreas: Unremarkable. No pancreatic ductal dilatation or surrounding inflammatory changes. Spleen: Normal in size without focal abnormality. Adrenals/Urinary Tract: Adrenal glands appear normal. Bilateral renal atrophy is noted with multiple renal cysts, consistent with history of end-stage renal disease. No hydronephrosis or renal obstruction is noted. Urinary bladder is unremarkable. Stomach/Bowel: Stomach is within normal limits. Appendix appears normal. No evidence of bowel wall thickening, distention, or inflammatory changes. Vascular/Lymphatic: Aortic atherosclerosis. No enlarged abdominal or pelvic lymph nodes.  Reproductive: Stable mild prostatic enlargement. Other: No abdominal wall hernia or abnormality. No abdominopelvic ascites. Musculoskeletal: No acute or significant osseous findings. IMPRESSION: Interval placement percutaneous drainage catheters into abscesses in the left hepatic lobe in posterior segment of right hepatic lobe. These are significantly smaller compared to prior exam status post drainage. Bilateral pleural effusions are noted with adjacent subsegmental atelectasis, right greater than left. Stable mild prostatic enlargement. Bilateral renal atrophy is noted consistent with history of end-stage renal disease. Aortic Atherosclerosis (ICD10-I70.0). Electronically Signed   By: Marijo Conception M.D.   On: 12/12/2020 15:38   CT ABDOMEN PELVIS W CONTRAST  Result Date: 12/10/2020 CLINICAL DATA:  Abdominal pain, fever, vomiting EXAM: CT ABDOMEN AND PELVIS WITH CONTRAST TECHNIQUE: Multidetector CT imaging of the abdomen and pelvis was performed using the standard protocol following bolus administration of intravenous contrast. CONTRAST:  179mL OMNIPAQUE IOHEXOL 300 MG/ML  SOLN COMPARISON:  None. FINDINGS: Lower chest: Small right pleural effusion. Atelectasis in the lower lobes. Diffuse coronary artery calcifications. Scattered aortic calcifications. Hepatobiliary: Numerous irregular low-density masses throughout the liver. Largest is in the posterior right hepatic lobe measuring up to 7.4 cm. Largest area in  the left hepatic lobe measures up to 5 cm. Other smaller low-density irregular lesions throughout the liver. These could reflect necrotic masses or abscesses. Gallbladder unremarkable. No biliary ductal dilatation. Pancreas: No focal abnormality or ductal dilatation. Spleen: No focal abnormality.  Normal size. Adrenals/Urinary Tract: Numerous bilateral renal cysts. Kidneys are atrophic. No hydronephrosis. Adrenal glands and urinary bladder unremarkable. Stomach/Bowel: Normal appendix. Stomach, large and  small bowel grossly unremarkable. Vascular/Lymphatic: Heavily calcified aorta, iliac vessels and branch vessels. No evidence of aneurysm or adenopathy. Reproductive: Prostate enlargement. Other: No free fluid or free air. Musculoskeletal: No acute bony abnormality. IMPRESSION: Numerous irregular low-density lesions throughout the liver, the largest in the posterior right hepatic lobe measuring 7.4 cm. These could reflect necrotic metastases or abscesses. Given the patient's history and appearance, abscesses are favored. Aortoiliac atherosclerosis. Numerous low-density lesions throughout the kidneys, likely cysts. Prostate enlargement. These results were called by telephone at the time of interpretation on 12/10/2020 at 7:48 pm to provider Dr. Roderic Palau, who verbally acknowledged these results. Electronically Signed   By: Rolm Baptise M.D.   On: 12/10/2020 19:50   CT IMAGE GUIDED DRAINAGE BY PERCUTANEOUS CATHETER  Result Date: 12/11/2020 INDICATION: Fever and chills with abdominal CT demonstrating findings worrisome for hepatic abscesses. Please perform image guided biopsy/aspiration and/or drainage catheter placement for diagnostic and therapeutic purposes. EXAM: ULTRASOUND AND CT-GUIDED HEPATIC ABSCESS DRAINAGE CATHETER PLACEMENT X2 COMPARISON:  CT abdomen and pelvis - 12/10/2020 MEDICATIONS: The patient is currently admitted to the hospital and receiving intravenous antibiotics. The antibiotics were administered within an appropriate time frame prior to the initiation of the procedure. ANESTHESIA/SEDATION: Moderate (conscious) sedation was employed during this procedure. A total of Versed 2 mg and Fentanyl 75 mcg was administered intravenously. Moderate Sedation Time: 38 minutes. The patient's level of consciousness and vital signs were monitored continuously by radiology nursing throughout the procedure under my direct supervision. CONTRAST:  None COMPLICATIONS: None immediate. PROCEDURE: Informed written consent  was obtained from the patient after a discussion of the risks, benefits and alternatives to treatment. The patient was placed supine on the CT gantry and a pre procedural CT was performed re-demonstrating the known indeterminate hypoattenuating masses/collections with dominant collection within the posterior segment of the right lobe of the liver measuring at least 7.9 x 5.8 cm (image 33, series 2 and additional collection within the posterior aspect the left lobe of the liver measuring approximately 4.8 x 3.5 cm (image 41, series 2). Sonographic evaluation was performed of the anterior segment of the right lobe of the liver however failed to definitively replicate the two small hypoattenuating lesions/collections demonstrated on preceding abdominal CT. The table position was marked and the collections were identified sonographically. The procedures were planned. A timeout was performed prior to the initiation of the procedure. The operative sites were prepped and draped in usual sterile fashion. Under direct ultrasound guidance, the dominant collection within the posterior aspect the right lobe of the liver was accessed with a 17 gauge trocar needle. Multiple ultrasound images were saved for procedural documentation purposes. Next, a small amount of purulent fluid was aspirated from the coaxial needle confirming and hepatic abscess. As such, a short Amplatz wire was coiled within the collection. The identical procedure was repeated for the additional ill-defined lesion within the left lobe of the liver also yielding a return of a small amount of purulent fluid. Appropriate positioning was confirmed with CT imaging. Next, the tracks were serially dilated allowing placement of 12 French percutaneous drainage catheters. Postprocedural imaging  demonstrates appropriate positioning of the drainage catheter within the posterior aspect the right lobe of the liver with malpositioning of the left percutaneous drainage  catheter. As such, the collection within the left lobe of the liver was re-targeted with ultrasound ultimately allowing successful placement of a 12 French percutaneous drainage catheter. Appropriate position was confirmed with CT imaging (series 5). Approximately 80 cc of purulent material was aspirated from the drainage catheter within the right lobe of the liver while approximately 25 cc of purulent fluid was aspirated from the left-sided percutaneous drainage catheter. A representative sample of aspirated fluid was capped and sent to the laboratory for analysis. Both drainage catheters were flushed with saline and connected to JP bulbs. Both drainage catheters were secured to the skin entrance site within interrupted sutures and StatLock devices. Dressings were applied. The patient tolerated both procedures well without immediate postprocedural complication. IMPRESSION: 1. Successful ultrasound and CT-guided placement of 12 French percutaneous drainage catheter into dominant abscess within the posterior segment of the right lobe of the liver yielding 80 cc of purulent material. 2. Successful ultrasound and CT-guided placement of a 12 French percutaneous drainage catheter into dominant abscess within the left lobe of the liver yielding 25 cc of purulent fluid. 3. A representative sample of aspirated purulent fluid was capped and sent to the laboratory for analysis. 4. Additional ill-defined collections within the anterior segment of the right lobe of the liver were not identified sonographically and thus not targeted. Electronically Signed   By: Sandi Mariscal M.D.   On: 12/11/2020 16:33   CT IMAGE GUIDED DRAINAGE BY PERCUTANEOUS CATHETER  Result Date: 12/11/2020 INDICATION: Fever and chills with abdominal CT demonstrating findings worrisome for hepatic abscesses. Please perform image guided biopsy/aspiration and/or drainage catheter placement for diagnostic and therapeutic purposes. EXAM: ULTRASOUND AND  CT-GUIDED HEPATIC ABSCESS DRAINAGE CATHETER PLACEMENT X2 COMPARISON:  CT abdomen and pelvis - 12/10/2020 MEDICATIONS: The patient is currently admitted to the hospital and receiving intravenous antibiotics. The antibiotics were administered within an appropriate time frame prior to the initiation of the procedure. ANESTHESIA/SEDATION: Moderate (conscious) sedation was employed during this procedure. A total of Versed 2 mg and Fentanyl 75 mcg was administered intravenously. Moderate Sedation Time: 38 minutes. The patient's level of consciousness and vital signs were monitored continuously by radiology nursing throughout the procedure under my direct supervision. CONTRAST:  None COMPLICATIONS: None immediate. PROCEDURE: Informed written consent was obtained from the patient after a discussion of the risks, benefits and alternatives to treatment. The patient was placed supine on the CT gantry and a pre procedural CT was performed re-demonstrating the known indeterminate hypoattenuating masses/collections with dominant collection within the posterior segment of the right lobe of the liver measuring at least 7.9 x 5.8 cm (image 33, series 2 and additional collection within the posterior aspect the left lobe of the liver measuring approximately 4.8 x 3.5 cm (image 41, series 2). Sonographic evaluation was performed of the anterior segment of the right lobe of the liver however failed to definitively replicate the two small hypoattenuating lesions/collections demonstrated on preceding abdominal CT. The table position was marked and the collections were identified sonographically. The procedures were planned. A timeout was performed prior to the initiation of the procedure. The operative sites were prepped and draped in usual sterile fashion. Under direct ultrasound guidance, the dominant collection within the posterior aspect the right lobe of the liver was accessed with a 17 gauge trocar needle. Multiple ultrasound images  were saved for procedural documentation  purposes. Next, a small amount of purulent fluid was aspirated from the coaxial needle confirming and hepatic abscess. As such, a short Amplatz wire was coiled within the collection. The identical procedure was repeated for the additional ill-defined lesion within the left lobe of the liver also yielding a return of a small amount of purulent fluid. Appropriate positioning was confirmed with CT imaging. Next, the tracks were serially dilated allowing placement of 12 French percutaneous drainage catheters. Postprocedural imaging demonstrates appropriate positioning of the drainage catheter within the posterior aspect the right lobe of the liver with malpositioning of the left percutaneous drainage catheter. As such, the collection within the left lobe of the liver was re-targeted with ultrasound ultimately allowing successful placement of a 12 French percutaneous drainage catheter. Appropriate position was confirmed with CT imaging (series 5). Approximately 80 cc of purulent material was aspirated from the drainage catheter within the right lobe of the liver while approximately 25 cc of purulent fluid was aspirated from the left-sided percutaneous drainage catheter. A representative sample of aspirated fluid was capped and sent to the laboratory for analysis. Both drainage catheters were flushed with saline and connected to JP bulbs. Both drainage catheters were secured to the skin entrance site within interrupted sutures and StatLock devices. Dressings were applied. The patient tolerated both procedures well without immediate postprocedural complication. IMPRESSION: 1. Successful ultrasound and CT-guided placement of 12 French percutaneous drainage catheter into dominant abscess within the posterior segment of the right lobe of the liver yielding 80 cc of purulent material. 2. Successful ultrasound and CT-guided placement of a 12 French percutaneous drainage catheter into  dominant abscess within the left lobe of the liver yielding 25 cc of purulent fluid. 3. A representative sample of aspirated purulent fluid was capped and sent to the laboratory for analysis. 4. Additional ill-defined collections within the anterior segment of the right lobe of the liver were not identified sonographically and thus not targeted. Electronically Signed   By: Sandi Mariscal M.D.   On: 12/11/2020 16:33    Labs:  CBC: Recent Labs    12/11/20 0330 12/12/20 0607 12/12/20 0805 12/13/20 0504  WBC 16.6* 19.8* 18.3* 14.7*  HGB 9.3* 7.5* 7.6* 8.2*  HCT 28.9* 23.1* 23.0* 25.8*  PLT 248 212 212 269    COAGS: Recent Labs    12/11/20 0330 12/12/20 0805  INR 1.4* 1.5*    BMP: Recent Labs    12/10/20 1815 12/11/20 0330 12/12/20 0607 12/13/20 0504  NA 134* 130* 131* 134*  K 3.6 3.6 4.2 3.9  CL 89* 89* 90* 91*  CO2 32 27 23 25   GLUCOSE 319* 322* 330* 216*  BUN 18 25* 41* 26*  CALCIUM 8.7* 8.2* 8.2* 8.9  CREATININE 4.09* 4.73* 7.40* 4.77*  GFRNONAA 15* 13* 7* 12*    LIVER FUNCTION TESTS: Recent Labs    12/04/20 1334 12/10/20 1815 12/11/20 0330 12/12/20 0607  BILITOT 0.7 0.8 0.8 1.5*  AST 102* 79* 43* 134*  ALT 92* 81* 67* 99*  ALKPHOS 87 140* 129* 96  PROT 6.7 7.5 7.0 5.6*  ALBUMIN 2.4* 2.5* 2.4* 1.8*    Assessment and Plan:  Hepatic abscesses Drains in IR placed 6/30 Both draining well NGTD Will follow  Electronically Signed: Lavonia Drafts, PA-C 12/13/2020, 2:37 PM   I spent a total of 15 Minutes at the the patient's bedside AND on the patient's hospital floor or unit, greater than 50% of which was counseling/coordinating care for hepatic abscess drains

## 2020-12-13 NOTE — Progress Notes (Signed)
[facility] Progress Note  Patient: [name]    TIW:[id number]  DOA: 12/10/2020     Date of Service: the patient was seen and examined on 12/13/2020  Brief hospital course: Past medical history of ESRD on HD MWF, HTN, HFpEF, CAD SP CABG, type II DM.  Presents with complaints of abdominal pain and leukocytosis.  Found to have liver abscess. S/P IR guided drain placement and biopsy on 6/30  Currently plan is monitor cultures and cytology, treat hypertension, monitor H&H.  Subjective: No bleeding.  No nausea no vomiting.  No abdominal pain.  Reports fatigue and tiredness.  Assessment and Plan: 1.  Liver lesions suspected abscess SP IR ultrasound guided drain placement. Follow-up on cultures and cytology. Initially on IV vancomycin and Zosyn.  Transition to IV Zosyn only. Gram-negative rods seen on the culture.  2.  Hypotensive episode resolved.  With midodrine Blood pressure dropped significantly on 7/1. Hemoglobin dropped 2 g as well.  Now stable. CT abdomen was performed due to recent procedure.  No acute bleeding. Blood pressure improved with midodrine. Hold antihypertensive agent.  Midodrine with HD.  3.  ESRD on HD MWF Nephrology consulted. Appreciate assistance.  4.  Constipation. Bowel regimen.  5.  CAD SP CABG HTN Aspirin on hold. Was on Coreg.  Now on hold. Lipitor on hold as well.  6.  Chronic ataxia CT scan, MRI negative for any acute stroke but TSH normal. B12 normal. As needed meclizine  PT OT consulted.  Scheduled Meds:  Chlorhexidine Gluconate Cloth  6 each Topical Q0600   sevelamer carbonate  800 mg Oral TID WC   sodium chloride flush  5 mL Intracatheter Q8H   Continuous Infusions:  piperacillin-tazobactam (ZOSYN)  IV 3.375 g (12/13/20 1706)   PRN Meds: acetaminophen **OR** acetaminophen, fentaNYL (SUBLIMAZE) injection, ondansetron (ZOFRAN) IV, oxyCODONE, polyethylene glycol  Body mass index is 23.06 kg/m.        DVT Prophylaxis:    SCDs Start: 12/11/20 0112    Advance goals of care discussion: Pt is Full code.  Family Communication: no family was present at bedside, at the time of interview.   Data Reviewed: I have personally reviewed and interpreted daily labs, tele strips, imaging. Sodium improving.  Potassium stable.  Hemoglobin stable 8.2.  Leukocytosis improving.  Physical Exam: General: Appear in mild distress, no Rash; Oral Mucosa Clear, moist. no Abnormal Neck Mass Or lumps, Conjunctiva normal  Cardiovascular: S1 and S2 Present, no Murmur, Respiratory: good respiratory effort, Bilateral Air entry present and CTA, no Crackles, no wheezes Abdomen: Bowel Sound present, Soft and no tenderness Extremities: no Pedal edema Neurology: alert and oriented to time, place, and person affect appropriate. no new focal deficit Gait not checked due to patient safety concerns   Vitals:   12/13/20 0921 12/13/20 0928 12/13/20 1157 12/13/20 1617  BP:   134/68 (!) 163/73  Pulse: 79 77 73 81  Resp: 19 20 20  (!) 23  Temp:   98.1 F (36.7 C) 98.2 F (36.8 C)  TempSrc:   Oral Oral  SpO2: (!) 83% 94% 96% 94%  Weight:      Height:        Disposition:  Status is: Inpatient  Remains inpatient appropriate because:IV treatments appropriate due to intensity of illness or inability to take PO  Dispo: The patient is from: Home              Anticipated d/c is to: SNF  Patient currently is not medically stable to d/c.   Difficult to place patient No        Time spent: 35 minutes. I reviewed all nursing notes, pharmacy notes, vitals, pertinent old records. I have discussed plan of care as described above with RN.  Author: Berle Mull, MD [facility] 12/13/2020 7:40 PM  To reach On-call, see care teams to locate the attending and reach out via [email or web address]. Between 7PM-7AM, please contact night-coverage If you still have difficulty reaching the attending provider, please page the [facility] (Director  on Call) for [facility] on amion for assistance.

## 2020-12-13 NOTE — Progress Notes (Signed)
[facility] NEPHROLOGY PROGRESS NOTE  Assessment/ Plan: Pt is a 70 y.o. yo male  with past medical history of HTN, DM, CAD status post CABG, diastolic CHF, ESRD on HD MWF at [facility] presented with abdominal pain, nausea vomiting seen as a consultation for the management of ESRD.  #Liver abscess concerning for possible metastatic necrosis: Status post CT-guided drainage of purulent fluid by IR on 6/30.  Currently on broad-spectrum antibiotics with vancomycin and Zosyn.  Further plan to IR and primary team.   # ESRD: MWF at [facility] in [place].  Tolerated dialysis well yesterday.  Plan for next HD on 7/4.  Clinically stable.   # Hypertension: Blood pressure is acceptable.  We will assess if he needs midodrine before HD.  Monitor blood pressure.    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  No IV iron because of infection/sepsis.  I will hold ESA because of concern for liver mass.   # Metabolic Bone Disease: Continue Renvela.  Monitor calcium and phosphorus level.  Subjective: Seen and examined at bedside.  No new event.  Denies nausea vomiting chest pain shortness of breath. Objective Vital signs in last 24 hours: Vitals:   12/13/20 0400 12/13/20 0822 12/13/20 0921 12/13/20 0928  BP: (!) 127/57 (!) 158/72    Pulse: 74 77 79 77  Resp: 19 (!) 21 19 20   Temp: 98 F (36.7 C) 97.6 F (36.4 C)    TempSrc:  Oral    SpO2:  92% (!) 83% 94%  Weight:      Height:       Weight change: 6.6 kg  Intake/Output Summary (Last 24 hours) at 12/13/2020 1001 Last data filed at 12/13/2020 0548 Gross per 24 hour  Intake 100.23 ml  Output 685 ml  Net -584.77 ml       Labs: Basic Metabolic Panel: Recent Labs  Lab 12/11/20 0330 12/12/20 0607 12/12/20 0805 12/13/20 0504  NA 130* 131*  --  134*  K 3.6 4.2  --  3.9  CL 89* 90*  --  91*  CO2 27 23  --  25  GLUCOSE 322* 330*  --  216*  BUN 25* 41*  --  26*  CREATININE 4.73* 7.40*  --  4.77*  CALCIUM 8.2* 8.2*   --  8.9  PHOS  --   --  5.1*  --    Liver Function Tests: Recent Labs  Lab 12/10/20 1815 12/11/20 0330 12/12/20 0607  AST 79* 43* 134*  ALT 81* 67* 99*  ALKPHOS 140* 129* 96  BILITOT 0.8 0.8 1.5*  PROT 7.5 7.0 5.6*  ALBUMIN 2.5* 2.4* 1.8*   Recent Labs  Lab 12/10/20 1815  LIPASE 148*   No results for input(s): AMMONIA in the last 168 hours. CBC: Recent Labs  Lab 12/10/20 1815 12/11/20 0330 12/12/20 0607 12/12/20 0805 12/13/20 0504  WBC 16.7* 16.6* 19.8* 18.3* 14.7*  NEUTROABS 15.0*  --  18.0*  --   --   HGB 9.0* 9.3* 7.5* 7.6* 8.2*  HCT 27.8* 28.9* 23.1* 23.0* 25.8*  MCV 95.2 96.7 95.5 93.9 95.2  PLT 259 248 212 212 269   Cardiac Enzymes: No results for input(s): CKTOTAL, CKMB, CKMBINDEX, TROPONINI in the last 168 hours. CBG: No results for input(s): GLUCAP in the last 168 hours.  Iron Studies:  Recent Labs    12/12/20 0805  IRON 19*  TIBC NOT CALCULATED  FERRITIN 2,625*   Studies/Results: CT ABDOMEN PELVIS W CONTRAST  Result Date:  12/12/2020 CLINICAL DATA:  Liver lesions.  Status post drain placement. EXAM: CT ABDOMEN AND PELVIS WITH CONTRAST TECHNIQUE: Multidetector CT imaging of the abdomen and pelvis was performed using the standard protocol following bolus administration of intravenous contrast. CONTRAST:  130mL OMNIPAQUE IOHEXOL 300 MG/ML  SOLN COMPARISON:  December 10, 2020. FINDINGS: Lower chest: Bilateral pleural effusions are noted with adjacent subsegmental atelectasis, right greater than left. Hepatobiliary: Status post interval placement of percutaneous drainage catheter is seen involving the abscesses in the left hepatic lobe and posterior segment of right hepatic lobe. Left hepatic abscess measures 4.6 x 3.9 cm currently which is decreased compared to prior exam. Right posterior abscess measures 7.0 x 5.1 cm which is decreased compared to prior exam. Multiple smaller other low densities or abscess are noted which are not significantly changed. No  gallstones or biliary dilatation is noted. Pancreas: Unremarkable. No pancreatic ductal dilatation or surrounding inflammatory changes. Spleen: Normal in size without focal abnormality. Adrenals/Urinary Tract: Adrenal glands appear normal. Bilateral renal atrophy is noted with multiple renal cysts, consistent with history of end-stage renal disease. No hydronephrosis or renal obstruction is noted. Urinary bladder is unremarkable. Stomach/Bowel: Stomach is within normal limits. Appendix appears normal. No evidence of bowel wall thickening, distention, or inflammatory changes. Vascular/Lymphatic: Aortic atherosclerosis. No enlarged abdominal or pelvic lymph nodes. Reproductive: Stable mild prostatic enlargement. Other: No abdominal wall hernia or abnormality. No abdominopelvic ascites. Musculoskeletal: No acute or significant osseous findings. IMPRESSION: Interval placement percutaneous drainage catheters into abscesses in the left hepatic lobe in posterior segment of right hepatic lobe. These are significantly smaller compared to prior exam status post drainage. Bilateral pleural effusions are noted with adjacent subsegmental atelectasis, right greater than left. Stable mild prostatic enlargement. Bilateral renal atrophy is noted consistent with history of end-stage renal disease. Aortic Atherosclerosis (ICD10-I70.0). Electronically Signed   By: Marijo Conception M.D.   On: 12/12/2020 15:38   CT IMAGE GUIDED DRAINAGE BY PERCUTANEOUS CATHETER  Result Date: 12/11/2020 INDICATION: Fever and chills with abdominal CT demonstrating findings worrisome for hepatic abscesses. Please perform image guided biopsy/aspiration and/or drainage catheter placement for diagnostic and therapeutic purposes. EXAM: ULTRASOUND AND CT-GUIDED HEPATIC ABSCESS DRAINAGE CATHETER PLACEMENT X2 COMPARISON:  CT abdomen and pelvis - 12/10/2020 MEDICATIONS: The patient is currently admitted to the hospital and receiving intravenous antibiotics. The  antibiotics were administered within an appropriate time frame prior to the initiation of the procedure. ANESTHESIA/SEDATION: Moderate (conscious) sedation was employed during this procedure. A total of Versed 2 mg and Fentanyl 75 mcg was administered intravenously. Moderate Sedation Time: 38 minutes. The patient's level of consciousness and vital signs were monitored continuously by radiology nursing throughout the procedure under my direct supervision. CONTRAST:  None COMPLICATIONS: None immediate. PROCEDURE: Informed written consent was obtained from the patient after a discussion of the risks, benefits and alternatives to treatment. The patient was placed supine on the CT gantry and a pre procedural CT was performed re-demonstrating the known indeterminate hypoattenuating masses/collections with dominant collection within the posterior segment of the right lobe of the liver measuring at least 7.9 x 5.8 cm (image 33, series 2 and additional collection within the posterior aspect the left lobe of the liver measuring approximately 4.8 x 3.5 cm (image 41, series 2). Sonographic evaluation was performed of the anterior segment of the right lobe of the liver however failed to definitively replicate the two small hypoattenuating lesions/collections demonstrated on preceding abdominal CT. The table position was marked and the collections were identified sonographically.  The procedures were planned. A timeout was performed prior to the initiation of the procedure. The operative sites were prepped and draped in usual sterile fashion. Under direct ultrasound guidance, the dominant collection within the posterior aspect the right lobe of the liver was accessed with a 17 gauge trocar needle. Multiple ultrasound images were saved for procedural documentation purposes. Next, a small amount of purulent fluid was aspirated from the coaxial needle confirming and hepatic abscess. As such, a short Amplatz wire was coiled within the  collection. The identical procedure was repeated for the additional ill-defined lesion within the left lobe of the liver also yielding a return of a small amount of purulent fluid. Appropriate positioning was confirmed with CT imaging. Next, the tracks were serially dilated allowing placement of 12 French percutaneous drainage catheters. Postprocedural imaging demonstrates appropriate positioning of the drainage catheter within the posterior aspect the right lobe of the liver with malpositioning of the left percutaneous drainage catheter. As such, the collection within the left lobe of the liver was re-targeted with ultrasound ultimately allowing successful placement of a 12 French percutaneous drainage catheter. Appropriate position was confirmed with CT imaging (series 5). Approximately 80 cc of purulent material was aspirated from the drainage catheter within the right lobe of the liver while approximately 25 cc of purulent fluid was aspirated from the left-sided percutaneous drainage catheter. A representative sample of aspirated fluid was capped and sent to the laboratory for analysis. Both drainage catheters were flushed with saline and connected to JP bulbs. Both drainage catheters were secured to the skin entrance site within interrupted sutures and StatLock devices. Dressings were applied. The patient tolerated both procedures well without immediate postprocedural complication. IMPRESSION: 1. Successful ultrasound and CT-guided placement of 12 French percutaneous drainage catheter into dominant abscess within the posterior segment of the right lobe of the liver yielding 80 cc of purulent material. 2. Successful ultrasound and CT-guided placement of a 12 French percutaneous drainage catheter into dominant abscess within the left lobe of the liver yielding 25 cc of purulent fluid. 3. A representative sample of aspirated purulent fluid was capped and sent to the laboratory for analysis. 4. Additional  ill-defined collections within the anterior segment of the right lobe of the liver were not identified sonographically and thus not targeted. Electronically Signed   By: Sandi Mariscal M.D.   On: 12/11/2020 16:33   CT IMAGE GUIDED DRAINAGE BY PERCUTANEOUS CATHETER  Result Date: 12/11/2020 INDICATION: Fever and chills with abdominal CT demonstrating findings worrisome for hepatic abscesses. Please perform image guided biopsy/aspiration and/or drainage catheter placement for diagnostic and therapeutic purposes. EXAM: ULTRASOUND AND CT-GUIDED HEPATIC ABSCESS DRAINAGE CATHETER PLACEMENT X2 COMPARISON:  CT abdomen and pelvis - 12/10/2020 MEDICATIONS: The patient is currently admitted to the hospital and receiving intravenous antibiotics. The antibiotics were administered within an appropriate time frame prior to the initiation of the procedure. ANESTHESIA/SEDATION: Moderate (conscious) sedation was employed during this procedure. A total of Versed 2 mg and Fentanyl 75 mcg was administered intravenously. Moderate Sedation Time: 38 minutes. The patient's level of consciousness and vital signs were monitored continuously by radiology nursing throughout the procedure under my direct supervision. CONTRAST:  None COMPLICATIONS: None immediate. PROCEDURE: Informed written consent was obtained from the patient after a discussion of the risks, benefits and alternatives to treatment. The patient was placed supine on the CT gantry and a pre procedural CT was performed re-demonstrating the known indeterminate hypoattenuating masses/collections with dominant collection within the posterior segment of the right  lobe of the liver measuring at least 7.9 x 5.8 cm (image 33, series 2 and additional collection within the posterior aspect the left lobe of the liver measuring approximately 4.8 x 3.5 cm (image 41, series 2). Sonographic evaluation was performed of the anterior segment of the right lobe of the liver however failed to  definitively replicate the two small hypoattenuating lesions/collections demonstrated on preceding abdominal CT. The table position was marked and the collections were identified sonographically. The procedures were planned. A timeout was performed prior to the initiation of the procedure. The operative sites were prepped and draped in usual sterile fashion. Under direct ultrasound guidance, the dominant collection within the posterior aspect the right lobe of the liver was accessed with a 17 gauge trocar needle. Multiple ultrasound images were saved for procedural documentation purposes. Next, a small amount of purulent fluid was aspirated from the coaxial needle confirming and hepatic abscess. As such, a short Amplatz wire was coiled within the collection. The identical procedure was repeated for the additional ill-defined lesion within the left lobe of the liver also yielding a return of a small amount of purulent fluid. Appropriate positioning was confirmed with CT imaging. Next, the tracks were serially dilated allowing placement of 12 French percutaneous drainage catheters. Postprocedural imaging demonstrates appropriate positioning of the drainage catheter within the posterior aspect the right lobe of the liver with malpositioning of the left percutaneous drainage catheter. As such, the collection within the left lobe of the liver was re-targeted with ultrasound ultimately allowing successful placement of a 12 French percutaneous drainage catheter. Appropriate position was confirmed with CT imaging (series 5). Approximately 80 cc of purulent material was aspirated from the drainage catheter within the right lobe of the liver while approximately 25 cc of purulent fluid was aspirated from the left-sided percutaneous drainage catheter. A representative sample of aspirated fluid was capped and sent to the laboratory for analysis. Both drainage catheters were flushed with saline and connected to JP bulbs. Both  drainage catheters were secured to the skin entrance site within interrupted sutures and StatLock devices. Dressings were applied. The patient tolerated both procedures well without immediate postprocedural complication. IMPRESSION: 1. Successful ultrasound and CT-guided placement of 12 French percutaneous drainage catheter into dominant abscess within the posterior segment of the right lobe of the liver yielding 80 cc of purulent material. 2. Successful ultrasound and CT-guided placement of a 12 French percutaneous drainage catheter into dominant abscess within the left lobe of the liver yielding 25 cc of purulent fluid. 3. A representative sample of aspirated purulent fluid was capped and sent to the laboratory for analysis. 4. Additional ill-defined collections within the anterior segment of the right lobe of the liver were not identified sonographically and thus not targeted. Electronically Signed   By: Sandi Mariscal M.D.   On: 12/11/2020 16:33    Medications: Infusions:  piperacillin-tazobactam (ZOSYN)  IV 3.375 g (12/13/20 0533)    Scheduled Medications:  Chlorhexidine Gluconate Cloth  6 each Topical Q0600   midodrine  10 mg Oral Q M,W,F   sevelamer carbonate  800 mg Oral TID WC   sodium chloride flush  5 mL Intracatheter Q8H    have reviewed scheduled and prn medications.  Physical Exam: General:NAD, comfortable Heart:RRR, s1s2 nl Lungs:clear b/l, no crackle Abdomen:soft,  non-distended Extremities:No edema Dialysis Access: AV fistula has good thrill and bruit.  Hilding Quintanar Reesa Chew Shaughn Thomley 12/13/2020,10:01 AM  LOS: 3 days

## 2020-12-14 ENCOUNTER — Inpatient Hospital Stay (HOSPITAL_COMMUNITY): Payer: Medicare Other

## 2020-12-14 MED ORDER — SODIUM CHLORIDE 0.9 % IV SOLN
3.0000 g | INTRAVENOUS | Status: DC
  Administered 2020-12-14 – 2020-12-15 (×2): 3 g via INTRAVENOUS
  Filled 2020-12-14: qty 3
  Filled 2020-12-14 (×2): qty 8

## 2020-12-14 MED ORDER — HEPARIN SODIUM (PORCINE) 5000 UNIT/ML IJ SOLN
5000.0000 [IU] | Freq: Three times a day (TID) | INTRAMUSCULAR | Status: DC
  Administered 2020-12-14 – 2020-12-17 (×9): 5000 [IU] via SUBCUTANEOUS
  Filled 2020-12-14 (×9): qty 1

## 2020-12-14 MED ORDER — CHLORHEXIDINE GLUCONATE CLOTH 2 % EX PADS
6.0000 | MEDICATED_PAD | Freq: Every day | CUTANEOUS | Status: DC
  Administered 2020-12-14 – 2020-12-17 (×4): 6 via TOPICAL

## 2020-12-14 MED ORDER — TRAZODONE HCL 50 MG PO TABS
50.0000 mg | ORAL_TABLET | Freq: Every evening | ORAL | Status: DC | PRN
  Administered 2020-12-14 – 2020-12-16 (×2): 50 mg via ORAL
  Filled 2020-12-14 (×2): qty 1

## 2020-12-14 MED ORDER — ZOLPIDEM TARTRATE 5 MG PO TABS
5.0000 mg | ORAL_TABLET | Freq: Once | ORAL | Status: AC
  Administered 2020-12-14: 5 mg via ORAL
  Filled 2020-12-14: qty 1

## 2020-12-14 NOTE — Progress Notes (Signed)
[facility] Progress Note  Patient: [name]    UEA:[id number]  DOA: 12/10/2020     Date of Service: the patient was seen and examined on 12/14/2020  Brief hospital course: Past medical history of ESRD on HD MWF, HTN, HFpEF, CAD SP CABG, type II DM.  Presents with complaints of abdominal pain and leukocytosis.  Found to have liver abscess. S/P IR guided drain placement and biopsy on 6/30  Currently plan is monitor results of the culture and cytology and pain control.  Subjective: Reports pleuritic chest pain on right side whenever he is taking a deep breath.  No nausea no vomiting.  No fever no chills.  Dry cough.  No diarrhea no constipation.  No bleeding.  Assessment and Plan: 1.  Liver lesions suspected abscess SP IR ultrasound guided drain placement. Follow-up on cultures and cytology. Initially on IV vancomycin and Zosyn.  Transition to IV Zosyn only. Now on IV Unasyn Gram-negative rods seen on the culture.  Currently no growth.  2.  Hypotensive episode resolved.  With midodrine Blood pressure dropped significantly on 7/1. Hemoglobin dropped 2 g as well.  Now stable. CT abdomen was performed due to recent procedure.  No acute bleeding. Blood pressure improved with midodrine. Hold antihypertensive agent.  Midodrine with HD.  3.  ESRD on HD MWF Nephrology consulted. Appreciate assistance.  4.  Constipation. Bowel regimen.  5.  CAD SP CABG HTN Aspirin on hold. Was on Coreg.  Now on hold. Lipitor on hold as well.  6.  Chronic ataxia CT scan, MRI negative for any acute stroke but TSH normal. B12 normal. As needed meclizine  PT OT consulted.  7.  Anemia. 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 Hemoglobin dropped significantly. As of right now hemoglobin remained stable. Will resume DVT prophylaxis for heparin.  8.  Pleuritic chest pain. Chest x-ray and x-ray abdomen does not show any evidence of abnormality. CT abdomen yesterday does not show any  evidence of PE. As of right now patient is not any more hypoxic or tachycardic. Not hypotensive as well. So does not think that the patient actually suffering from PE although remains at high risk for PE as there is suspicious for malignancy and patient has not been reported to receive DVT prophylaxis due to anemia. Will check lower extremity Doppler. Continue to monitor on telemetry.  Scheduled Meds:  Chlorhexidine Gluconate Cloth  6 each Topical Q0600   Chlorhexidine Gluconate Cloth  6 each Topical Q0600   heparin injection (subcutaneous)  5,000 Units Subcutaneous Q8H   sevelamer carbonate  800 mg Oral TID WC   sodium chloride flush  5 mL Intracatheter Q8H   Continuous Infusions:  ampicillin-sulbactam (UNASYN) IV Stopped (12/14/20 1801)   PRN Meds: acetaminophen **OR** acetaminophen, fentaNYL (SUBLIMAZE) injection, ondansetron (ZOFRAN) IV, oxyCODONE, polyethylene glycol  Body mass index is 23.27 kg/m.        DVT Prophylaxis:   heparin injection 5,000 Units Start: 12/14/20 1400 SCDs Start: 12/11/20 0112    Advance goals of care discussion: Pt is Full code.  Family Communication: no family was present at bedside, at the time of interview.   Data Reviewed: I have personally reviewed and interpreted daily labs, tele strips, imaging. Sodium 134.  WBC 14.7.  Improving.  Hemoglobin 8.2.  Stable.  X-ray chest and abdomen shows evidence of tubes in the right places.  Right pleural effusion.  Physical Exam: General: Appear in mild distress, no Rash; Oral Mucosa Clear, moist. no Abnormal Neck Mass Or lumps, Conjunctiva  normal  Cardiovascular: S1 and S2 Present, no Murmur, Respiratory: good respiratory effort, Bilateral Air entry present and CTA, no Crackles, no wheezes Abdomen: Bowel Sound present, Soft and no tenderness Extremities: no Pedal edema Neurology: alert and oriented to time, place, and person affect appropriate. no new focal deficit Gait not checked due to patient  safety concerns  Vitals:   12/14/20 1153 12/14/20 1200 12/14/20 1353 12/14/20 1610  BP: (!) 181/78 (!) 177/81 (!) 176/84 (!) 157/67  Pulse: 79 79 76 77  Resp: 20 19 (!) 22 19  Temp: 98.5 F (36.9 C)   98.7 F (37.1 C)  TempSrc: Oral   Oral  SpO2: 94% 93% 93% 96%  Weight:      Height:        Disposition:  Status is: Inpatient  Remains inpatient appropriate because:IV treatments appropriate due to intensity of illness or inability to take PO  Dispo: The patient is from: Home              Anticipated d/c is to: SNF              Patient currently is not medically stable to d/c.   Difficult to place patient No        Time spent: 35 minutes. I reviewed all nursing notes, pharmacy notes, vitals, pertinent old records. I have discussed plan of care as described above with RN.  Author: Berle Mull, MD [facility] 12/14/2020 7:31 PM  To reach On-call, see care teams to locate the attending and reach out via [email or web address]. Between 7PM-7AM, please contact night-coverage If you still have difficulty reaching the attending provider, please page the [facility] (Director on Call) for [facility] on amion for assistance.

## 2020-12-14 NOTE — Progress Notes (Signed)
[facility] NEPHROLOGY PROGRESS NOTE  Assessment/ Plan: Pt is a 70 y.o. yo male  with past medical history of HTN, DM, CAD status post CABG, diastolic CHF, ESRD on HD MWF at [facility] presented with abdominal pain, nausea vomiting seen as a consultation for the management of ESRD.  #Liver abscess concerning for possible metastatic necrosis: Status post CT-guided drainage of purulent fluid by IR on 6/30.  The antibiotics narrowed down to Zosyn, received vancomycin in the beginning.  Further plan to IR and primary team.   # ESRD: MWF at [facility] in [place].  Tolerated dialysis well on 7/1.  Plan for next HD on 7/4.  Clinically stable.   # Hypertension: Blood pressure is acceptable.  Not on antihypertensives currently.  Monitor blood pressure.    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  No IV iron because of infection/sepsis.  I will hold ESA because of concern for liver mass.   # Metabolic Bone Disease: Continue Renvela.  Monitor calcium and phosphorus level.  Subjective: Seen and examined at bedside.  No new event.  He feels tired and reports some abdomen discomfort.  Denies nausea vomiting, chest pain or shortness of breath.  Objective Vital signs in last 24 hours: Vitals:   12/13/20 2337 12/14/20 0400 12/14/20 0659 12/14/20 0750  BP: 140/68 (!) 185/84 (!) 171/76 (!) 161/76  Pulse: 74 82  77  Resp: 20 20  20   Temp: 98.7 F (37.1 C) 98.4 F (36.9 C)  97.7 F (36.5 C)  TempSrc: Oral Oral  Axillary  SpO2: 95% 92%  95%  Weight:  89 kg    Height:       Weight change: 0.8 kg  Intake/Output Summary (Last 24 hours) at 12/14/2020 1034 Last data filed at 12/14/2020 0554 Gross per 24 hour  Intake 360 ml  Output 55 ml  Net 305 ml        Labs: Basic Metabolic Panel: Recent Labs  Lab 12/11/20 0330 12/12/20 0607 12/12/20 0805 12/13/20 0504  NA 130* 131*  --  134*  K 3.6 4.2  --  3.9  CL 89* 90*  --  91*  CO2 27 23  --  25  GLUCOSE 322* 330*   --  216*  BUN 25* 41*  --  26*  CREATININE 4.73* 7.40*  --  4.77*  CALCIUM 8.2* 8.2*  --  8.9  PHOS  --   --  5.1*  --     Liver Function Tests: Recent Labs  Lab 12/10/20 1815 12/11/20 0330 12/12/20 0607  AST 79* 43* 134*  ALT 81* 67* 99*  ALKPHOS 140* 129* 96  BILITOT 0.8 0.8 1.5*  PROT 7.5 7.0 5.6*  ALBUMIN 2.5* 2.4* 1.8*    Recent Labs  Lab 12/10/20 1815  LIPASE 148*    No results for input(s): AMMONIA in the last 168 hours. CBC: Recent Labs  Lab 12/10/20 1815 12/11/20 0330 12/12/20 0607 12/12/20 0805 12/13/20 0504  WBC 16.7* 16.6* 19.8* 18.3* 14.7*  NEUTROABS 15.0*  --  18.0*  --   --   HGB 9.0* 9.3* 7.5* 7.6* 8.2*  HCT 27.8* 28.9* 23.1* 23.0* 25.8*  MCV 95.2 96.7 95.5 93.9 95.2  PLT 259 248 212 212 269    Cardiac Enzymes: No results for input(s): CKTOTAL, CKMB, CKMBINDEX, TROPONINI in the last 168 hours. CBG: No results for input(s): GLUCAP in the last 168 hours.  Iron Studies:  Recent Labs    12/12/20 0805  IRON 19*  TIBC NOT CALCULATED  FERRITIN 2,625*    Studies/Results: CT ABDOMEN PELVIS W CONTRAST  Result Date: 12/12/2020 CLINICAL DATA:  Liver lesions.  Status post drain placement. EXAM: CT ABDOMEN AND PELVIS WITH CONTRAST TECHNIQUE: Multidetector CT imaging of the abdomen and pelvis was performed using the standard protocol following bolus administration of intravenous contrast. CONTRAST:  133mL OMNIPAQUE IOHEXOL 300 MG/ML  SOLN COMPARISON:  December 10, 2020. FINDINGS: Lower chest: Bilateral pleural effusions are noted with adjacent subsegmental atelectasis, right greater than left. Hepatobiliary: Status post interval placement of percutaneous drainage catheter is seen involving the abscesses in the left hepatic lobe and posterior segment of right hepatic lobe. Left hepatic abscess measures 4.6 x 3.9 cm currently which is decreased compared to prior exam. Right posterior abscess measures 7.0 x 5.1 cm which is decreased compared to prior exam.  Multiple smaller other low densities or abscess are noted which are not significantly changed. No gallstones or biliary dilatation is noted. Pancreas: Unremarkable. No pancreatic ductal dilatation or surrounding inflammatory changes. Spleen: Normal in size without focal abnormality. Adrenals/Urinary Tract: Adrenal glands appear normal. Bilateral renal atrophy is noted with multiple renal cysts, consistent with history of end-stage renal disease. No hydronephrosis or renal obstruction is noted. Urinary bladder is unremarkable. Stomach/Bowel: Stomach is within normal limits. Appendix appears normal. No evidence of bowel wall thickening, distention, or inflammatory changes. Vascular/Lymphatic: Aortic atherosclerosis. No enlarged abdominal or pelvic lymph nodes. Reproductive: Stable mild prostatic enlargement. Other: No abdominal wall hernia or abnormality. No abdominopelvic ascites. Musculoskeletal: No acute or significant osseous findings. IMPRESSION: Interval placement percutaneous drainage catheters into abscesses in the left hepatic lobe in posterior segment of right hepatic lobe. These are significantly smaller compared to prior exam status post drainage. Bilateral pleural effusions are noted with adjacent subsegmental atelectasis, right greater than left. Stable mild prostatic enlargement. Bilateral renal atrophy is noted consistent with history of end-stage renal disease. Aortic Atherosclerosis (ICD10-I70.0). Electronically Signed   By: Marijo Conception M.D.   On: 12/12/2020 15:38    Medications: Infusions:  piperacillin-tazobactam (ZOSYN)  IV Stopped (12/14/20 1023)    Scheduled Medications:  Chlorhexidine Gluconate Cloth  6 each Topical Q0600   sevelamer carbonate  800 mg Oral TID WC   sodium chloride flush  5 mL Intracatheter Q8H    have reviewed scheduled and prn medications.  Physical Exam: General:NAD, lying flat comfortable Heart:RRR, s1s2 nl Lungs: Clear b/l, no crackle Abdomen:soft,   non-distended Extremities:No LE edema Dialysis Access: AV fistula has good thrill and bruit.  Chanetta Moosman Prasad Alisse Tuite 12/14/2020,10:34 AM  LOS: 4 days

## 2020-12-14 NOTE — Progress Notes (Signed)
Pharmacy Antibiotic Note  [name] is a 70 y.o. male admitted on 12/10/2020 with  multiple liver abscesses vs necrotic metastasis .    Patient has ESRD on MWF HD. He is s/p IR drainage of liver abscesses on 6/30 with initial culture data showing few GNR. No other positive culture data. He remains afebrile. WBC remains elevated, yesterday at 14.7. Today is day 4 of Zosyn and pharmacy has been consulted for Unasyn dosing. Last dose of Zosyn around 5 am.   Antimicrobials this admission:  Cefepime x1 6/29 Metronidazole x1 6/29 Vancomycin x1 6/30 Zosyn 6/30>>7/3 Unasyn 7/3 >>  Microbiology results:  6/29 BCx: ngtd 6/30 Bcx: ngtd 6/30 abscess culture: few GNR  Plan: Discontinue Zosyn  Start Unasyn 3 grams IV Q24h after HD (will start tonight) Trend WBC, clinical status  F/u culture data, change to PO   Height: 6\' 5"  (195.6 cm) Weight: 89 kg (196 lb 3.4 oz) IBW/kg (Calculated) : 89.1  Temp (24hrs), Avg:98.3 F (36.8 C), Min:97.7 F (36.5 C), Max:98.7 F (37.1 C)  Recent Labs  Lab 12/10/20 1815 12/10/20 2045 12/11/20 0330 12/12/20 0607 12/12/20 0805 12/13/20 0504  WBC 16.7*  --  16.6* 19.8* 18.3* 14.7*  CREATININE 4.09*  --  4.73* 7.40*  --  4.77*  LATICACIDVEN  --  1.6  --   --   --   --     Estimated Creatinine Clearance: 18.4 mL/min (A) (by C-G formula based on SCr of 4.77 mg/dL (H)).    Allergies  Allergen Reactions   Shellfish Allergy Hives   Eddie Candle, PharmD, BCPS Clinical Pharmacist

## 2020-12-15 ENCOUNTER — Inpatient Hospital Stay (HOSPITAL_COMMUNITY): Payer: Medicare Other

## 2020-12-15 DIAGNOSIS — K769 Liver disease, unspecified: Secondary | ICD-10-CM

## 2020-12-15 LAB — BASIC METABOLIC PANEL
Anion gap: 14 (ref 5–15)
BUN: 47 mg/dL — ABNORMAL HIGH (ref 8–23)
CO2: 24 mmol/L (ref 22–32)
Calcium: 8.6 mg/dL — ABNORMAL LOW (ref 8.9–10.3)
Chloride: 89 mmol/L — ABNORMAL LOW (ref 98–111)
Creatinine, Ser: 7.57 mg/dL — ABNORMAL HIGH (ref 0.61–1.24)
GFR, Estimated: 7 mL/min — ABNORMAL LOW (ref >60)
Glucose, Bld: 292 mg/dL — ABNORMAL HIGH (ref 70–99)
Potassium: 3.9 mmol/L (ref 3.5–5.1)
Sodium: 127 mmol/L — ABNORMAL LOW (ref 135–145)

## 2020-12-15 LAB — CBC
HCT: 27.7 % — ABNORMAL LOW (ref 39.0–52.0)
Hemoglobin: 8.9 g/dL — ABNORMAL LOW (ref 13.0–17.0)
MCH: 30.2 pg (ref 26.0–34.0)
MCHC: 32.1 g/dL (ref 30.0–36.0)
MCV: 93.9 fL (ref 80.0–100.0)
Platelets: 249 10*3/uL (ref 150–400)
RBC: 2.95 MIL/uL — ABNORMAL LOW (ref 4.22–5.81)
RDW: 15 % (ref 11.5–15.5)
WBC: 8.8 10*3/uL (ref 4.0–10.5)
nRBC: 0 % (ref 0.0–0.2)

## 2020-12-15 LAB — GLUCOSE, CAPILLARY
Glucose-Capillary: 154 mg/dL — ABNORMAL HIGH (ref 70–99)
Glucose-Capillary: 160 mg/dL — ABNORMAL HIGH (ref 70–99)
Glucose-Capillary: 169 mg/dL — ABNORMAL HIGH (ref 70–99)

## 2020-12-15 LAB — MRSA NEXT GEN BY PCR, NASAL: MRSA by PCR Next Gen: NOT DETECTED

## 2020-12-15 LAB — MAGNESIUM: Magnesium: 2.3 mg/dL (ref 1.7–2.4)

## 2020-12-15 MED ORDER — LIDOCAINE-PRILOCAINE 2.5-2.5 % EX CREA: 1.0000 "application " | TOPICAL_CREAM | CUTANEOUS | Status: DC | PRN

## 2020-12-15 MED ORDER — AMLODIPINE BESYLATE 5 MG PO TABS
5.0000 mg | ORAL_TABLET | Freq: Every day | ORAL | Status: DC
  Administered 2020-12-15 – 2020-12-16 (×2): 5 mg via ORAL
  Filled 2020-12-15 (×3): qty 1

## 2020-12-15 MED ORDER — LIDOCAINE HCL (PF) 1 % IJ SOLN: 5.0000 mL | INTRAMUSCULAR | Status: DC | PRN

## 2020-12-15 MED ORDER — LABETALOL HCL 5 MG/ML IV SOLN
5.0000 mg | INTRAVENOUS | Status: DC | PRN
  Administered 2020-12-15 – 2020-12-16 (×5): 5 mg via INTRAVENOUS
  Filled 2020-12-15 (×5): qty 4

## 2020-12-15 MED ORDER — PENTAFLUOROPROP-TETRAFLUOROETH EX AERO: 1.0000 "application " | INHALATION_SPRAY | CUTANEOUS | Status: DC | PRN

## 2020-12-15 MED ORDER — SODIUM CHLORIDE 0.9 % IV SOLN: 100.0000 mL | INTRAVENOUS | Status: DC | PRN

## 2020-12-15 MED ORDER — HEPARIN SODIUM (PORCINE) 1000 UNIT/ML DIALYSIS: 1000.0000 [IU] | INTRAMUSCULAR | Status: DC | PRN

## 2020-12-15 MED ORDER — CLONIDINE HCL 0.1 MG PO TABS
0.1000 mg | ORAL_TABLET | Freq: Two times a day (BID) | ORAL | Status: DC
  Administered 2020-12-15 – 2020-12-16 (×3): 0.1 mg via ORAL
  Filled 2020-12-15 (×4): qty 1

## 2020-12-15 MED ORDER — CARVEDILOL 3.125 MG PO TABS
3.1250 mg | ORAL_TABLET | Freq: Two times a day (BID) | ORAL | Status: DC
  Administered 2020-12-15 – 2020-12-16 (×3): 3.125 mg via ORAL
  Filled 2020-12-15 (×3): qty 1

## 2020-12-15 MED ORDER — INSULIN ASPART 100 UNIT/ML IJ SOLN
0.0000 [IU] | Freq: Three times a day (TID) | INTRAMUSCULAR | Status: DC
  Administered 2020-12-15: 3 [IU] via SUBCUTANEOUS
  Administered 2020-12-16: 2 [IU] via SUBCUTANEOUS
  Administered 2020-12-16: 5 [IU] via SUBCUTANEOUS
  Administered 2020-12-17: 3 [IU] via SUBCUTANEOUS

## 2020-12-15 MED ORDER — CLONIDINE HCL 0.2 MG PO TABS: 0.2000 mg | ORAL_TABLET | Freq: Two times a day (BID) | ORAL | Status: DC

## 2020-12-15 MED ORDER — CARVEDILOL 25 MG PO TABS: 25.0000 mg | ORAL_TABLET | Freq: Two times a day (BID) | ORAL | Status: DC

## 2020-12-15 MED ORDER — AMLODIPINE BESYLATE 10 MG PO TABS: 10.0000 mg | ORAL_TABLET | Freq: Every day | ORAL | Status: DC

## 2020-12-15 MED ORDER — INSULIN ASPART 100 UNIT/ML IJ SOLN
0.0000 [IU] | Freq: Every day | INTRAMUSCULAR | Status: DC
  Administered 2020-12-16: 2 [IU] via SUBCUTANEOUS

## 2020-12-15 NOTE — Progress Notes (Signed)
[facility] NEPHROLOGY PROGRESS NOTE  Assessment/ Plan: Pt is a 70 y.o. yo male  with past medical history of HTN, DM, CAD status post CABG, diastolic CHF, ESRD on HD MWF at [facility] presented with abdominal pain, nausea vomiting seen as a consultation for the management of ESRD.  #Liver abscess concerning for possible metastatic necrosis: Status post CT-guided drainage of purulent fluid by IR on 6/30.  The antibiotics narrowed down to Zosyn, received vancomycin in the beginning.  Further plan per IR and primary team.   # ESRD: MWF at [facility] in [place].  [place] hd well, next treatment on 7/6   # Hypertension: Blood pressure is acceptable.  Not on antihypertensives currently.  Monitor blood pressure.    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  No IV iron because of infection/sepsis.  I will hold ESA because of concern for liver mass.   # Metabolic Bone Disease: Continue Renvela.  Monitor calcium and phosphorus level.  Subjective: Seen and examined on HD.  No acute events. Still having some abd discomfort from drain but overall unchanged.   Objective Vital signs in last 24 hours: Vitals:   12/15/20 0910 12/15/20 0919 12/15/20 0951 12/15/20 1024  BP: (!) 185/89 (!) 187/93 (!) 186/96 (!) 183/99  Pulse: 78 76 78 80  Resp: (!) 25 (!) 23 14 (!) 22  Temp: 97.8 F (36.6 C)     TempSrc: Oral     SpO2: 99%     Weight: 88.5 kg     Height:       Weight change:   Intake/Output Summary (Last 24 hours) at 12/15/2020 1053 Last data filed at 12/15/2020 0554 Gross per 24 hour  Intake 320 ml  Output 45 ml  Net 275 ml       Labs: Basic Metabolic Panel: Recent Labs  Lab 12/12/20 0607 12/12/20 0805 12/13/20 0504 12/15/20 0640  NA 131*  --  134* 127*  K 4.2  --  3.9 3.9  CL 90*  --  91* 89*  CO2 23  --  25 24  GLUCOSE 330*  --  216* 292*  BUN 41*  --  26* 47*  CREATININE 7.40*  --  4.77* 7.57*  CALCIUM 8.2*  --  8.9 8.6*  PHOS  --  5.1*  --    --    Liver Function Tests: Recent Labs  Lab 12/10/20 1815 12/11/20 0330 12/12/20 0607  AST 79* 43* 134*  ALT 81* 67* 99*  ALKPHOS 140* 129* 96  BILITOT 0.8 0.8 1.5*  PROT 7.5 7.0 5.6*  ALBUMIN 2.5* 2.4* 1.8*   Recent Labs  Lab 12/10/20 1815  LIPASE 148*   No results for input(s): AMMONIA in the last 168 hours. CBC: Recent Labs  Lab 12/10/20 1815 12/11/20 0330 12/12/20 0607 12/12/20 0805 12/13/20 0504 12/15/20 0640  WBC 16.7* 16.6* 19.8* 18.3* 14.7* 8.8  NEUTROABS 15.0*  --  18.0*  --   --   --   HGB 9.0* 9.3* 7.5* 7.6* 8.2* 8.9*  HCT 27.8* 28.9* 23.1* 23.0* 25.8* 27.7*  MCV 95.2 96.7 95.5 93.9 95.2 93.9  PLT 259 248 212 212 269 249   Cardiac Enzymes: No results for input(s): CKTOTAL, CKMB, CKMBINDEX, TROPONINI in the last 168 hours. CBG: No results for input(s): GLUCAP in the last 168 hours.  Iron Studies:  No results for input(s): IRON, TIBC, TRANSFERRIN, FERRITIN in the last 72 hours. Studies/Results: DG ABD ACUTE 2+V W 1V CHEST  Result Date:  12/14/2020 CLINICAL DATA:  Abdominal pain.  Pleuritic chest pain. EXAM: DG ABDOMEN ACUTE WITH 1 VIEW CHEST COMPARISON:  Abdominopelvic CT 2 days ago 12/12/2020. Most recent chest radiograph 12/04/2020 FINDINGS: Right pleural effusion and basilar atelectasis, grossly unchanged from recent CT allowing for differences in technique. Small left pleural effusion and basilar atelectasis. Patient is post median sternotomy. Unchanged cardiomegaly. No pneumothorax. Right upper quadrant drain not significantly changed in appearance from recent CT, located within within the liver. Midline catheter also unchanged in position, located in the liver on CT. There is no free intra-abdominal air. Enteric contrast within stool throughout the colon, no obstruction. Average volume of colonic stool. Stable osseous structures. IMPRESSION: 1. Right greater than left pleural effusion and bibasilar atelectasis, grossly stable from recent imaging. 2. Two  drainage catheters in the upper abdomen, located within hepatic collections on recent CT. Normal bowel gas pattern. Electronically Signed   By: Keith Rake M.D.   On: 12/14/2020 16:59    Medications: Infusions:  sodium chloride     sodium chloride     ampicillin-sulbactam (UNASYN) IV Stopped (12/14/20 1801)    Scheduled Medications:  carvedilol  3.125 mg Oral BID WC   Chlorhexidine Gluconate Cloth  6 each Topical Q0600   Chlorhexidine Gluconate Cloth  6 each Topical Q0600   heparin injection (subcutaneous)  5,000 Units Subcutaneous Q8H   sevelamer carbonate  800 mg Oral TID WC   sodium chloride flush  5 mL Intracatheter Q8H    have reviewed scheduled and prn medications.  Physical Exam: General:NAD, lying flat comfortable Heart:RRR, s1s2 nl Lungs: Clear b/l, no crackle Abdomen:soft,  non-distended Extremities:No LE edema Dialysis Access: AV fistula has good thrill and bruit.  [name] 12/15/2020,10:53 AM  LOS: 5 days

## 2020-12-15 NOTE — Progress Notes (Signed)
Lower extremity venous bilateral study attempted. Patient in hemodialysis. Will attempt again as schedule and patient availability permits.   Darlin Coco, RDMS, RVT

## 2020-12-15 NOTE — Progress Notes (Signed)
Lower extremity venous bilateral study completed.   Please see CV Proc for preliminary results.   Servando Kyllonen, RDMS, RVT  

## 2020-12-15 NOTE — Progress Notes (Signed)
[facility] Progress Note  Patient: [name]    YQM:[id number]  DOA: 12/10/2020     Date of Service: the patient was seen and examined on 12/15/2020  Brief hospital course: Past medical history of ESRD on HD MWF, HTN, HFpEF, CAD SP CABG, type II DM.  Presents with complaints of abdominal pain and leukocytosis.  Found to have liver abscess. 6/30 S/P IR guided drain placement and biopsy 7/1 hemoglobin dropped 2 g with hypotension.  Antihypertensive and DVT PX on hold.  CT abdomen negative for any acute injury 7/3 DVT PX resumed 7/4 antihypertensive resumed.  Currently plan is monitor results of the culture and cytology and pain control.  Subjective: Pleuritic pain is still present but improving.  No nausea no vomiting.  No diarrhea no constipation.  Blood pressure remains high.  Assessment and Plan: 1.  Liver lesions suspected abscess SP IR ultrasound guided drain placement. Follow-up on cultures and cytology. Initially on IV vancomycin and Zosyn.  Transition to IV Zosyn only. Now on IV Unasyn.  Will discuss with ID and GI if cultures remain negative tomorrow. Gram-negative rods seen on the culture.  Currently no growth.  2.  Hypotensive episode resolved.  With midodrine Blood pressure dropped significantly on 7/1. Hemoglobin dropped 2 g as well.  Now stable. CT abdomen was performed due to recent procedure.  No acute bleeding. Blood pressure improved with midodrine. Now the blood pressure is likely rather running high therefore midodrine has been discontinued. Will actually increase home regimen gradually though.  On Norvasc 10 mg we will initiate 5 mg.  On clonidine 0.2 mg twice a day we will initiate 0.1 mg.  Continue Coreg at a lower dose as well.  3.  ESRD on HD MWF Nephrology consulted. Appreciate assistance.  4.  Constipation. Bowel regimen.  5.  CAD SP CABG HTN Aspirin on hold. Was on Coreg.  Now on hold. Lipitor on hold as well.  6.  Chronic ataxia CT  scan, MRI negative for any acute stroke but TSH normal. B12 normal. As needed meclizine  PT OT consulted.  7.  Anemia of chronic kidney disease. 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 Hemoglobin dropped significantly. As of right now hemoglobin remained stable. Will resume DVT prophylaxis for heparin.  8.  Pleuritic chest pain. Pleural effusion Chest x-ray and x-ray abdomen does not show any evidence of abnormality. CT abdomen yesterday does not show any evidence of PE. Lower extremity Doppler negative for DVT.  Do not think the patient actually has PE. Pleuritic chest pain likely from liver lesion as well as sympathetic right-sided pleural effusion  9.  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 Initiate sliding scale AC at bedtime.  Scheduled Meds:  amLODipine  5 mg Oral Daily   carvedilol  3.125 mg Oral BID WC   Chlorhexidine Gluconate Cloth  6 each Topical Q0600   Chlorhexidine Gluconate Cloth  6 each Topical Q0600   cloNIDine  0.1 mg Oral BID   heparin injection (subcutaneous)  5,000 Units Subcutaneous Q8H   insulin aspart  0-15 Units Subcutaneous TID WC   insulin aspart  0-5 Units Subcutaneous QHS   sevelamer carbonate  800 mg Oral TID WC   sodium chloride flush  5 mL Intracatheter Q8H   Continuous Infusions:  ampicillin-sulbactam (UNASYN) IV Stopped (12/14/20 1801)   PRN Meds: acetaminophen **OR** acetaminophen, fentaNYL (SUBLIMAZE) injection, labetalol, ondansetron (ZOFRAN) IV, oxyCODONE, polyethylene glycol, traZODone  Body mass index  is 22.82 kg/m.        DVT Prophylaxis:   heparin injection 5,000 Units Start: 12/14/20 1400 SCDs Start: 12/11/20 0112    Advance goals of care discussion: Pt is Full code.  Family Communication: no family was present at bedside, at the time of interview.   Data Reviewed: I have personally reviewed and interpreted daily labs, tele strips,  mild hyponatremia.  Sodium 127. Hemoglobin stable.  CBG elevated  Physical Exam: General: Appear in mild distress, no Rash; Oral Mucosa Clear, moist. no Abnormal Neck Mass Or lumps, Conjunctiva normal  Cardiovascular: S1 and S2 Present, no Murmur, Respiratory: good respiratory effort, Bilateral Air entry present and CTA, no Crackles, no wheezes Abdomen: Bowel Sound present, Soft and no tenderness Extremities: no Pedal edema Neurology: alert and oriented to time, place, and person affect appropriate. no new focal deficit Gait not checked due to patient safety concerns  Vitals:   12/15/20 1200 12/15/20 1230 12/15/20 1250 12/15/20 1636  BP: (!) 191/95 (!) 199/98 (!) 169/76 (!) 182/80  Pulse: 82 84 83 85  Resp:   (!) 25 18  Temp:   99.1 F (37.3 C) 99 F (37.2 C)  TempSrc:   Oral Oral  SpO2:   92% 90%  Weight:   87.3 kg   Height:        Disposition:  Status is: Inpatient  Remains inpatient appropriate because:IV treatments appropriate due to intensity of illness or inability to take PO  Dispo: The patient is from: Home              Anticipated d/c is to: SNF              Patient currently is not medically stable to d/c.   Difficult to place patient No        Time spent: 35 minutes. I reviewed all nursing notes, pharmacy notes, vitals, pertinent old records. I have discussed plan of care as described above with RN.  Author: Berle Mull, MD [facility] 12/15/2020 6:01 PM  To reach On-call, see care teams to locate the attending and reach out via [email or web address]. Between 7PM-7AM, please contact night-coverage If you still have difficulty reaching the attending provider, please page the [facility] (Director on Call) for [facility] on amion for assistance.

## 2020-12-15 NOTE — Procedures (Signed)
I was present at this dialysis session. I have reviewed the session itself and made appropriate changes.   Filed Weights   12/12/20 1437 12/14/20 0400 12/15/20 0910  Weight: 88.2 kg 89 kg 88.5 kg    Recent Labs  Lab 12/12/20 0805 12/13/20 0504 12/15/20 0640  NA  --    < > 127*  K  --    < > 3.9  CL  --    < > 89*  CO2  --    < > 24  GLUCOSE  --    < > 292*  BUN  --    < > 47*  CREATININE  --    < > 7.57*  CALCIUM  --    < > 8.6*  PHOS 5.1*  --   --    < > = values in this interval not displayed.    Recent Labs  Lab 12/10/20 1815 12/11/20 0330 12/12/20 0607 12/12/20 0805 12/13/20 0504 12/15/20 0640  WBC 16.7*   < > 19.8* 18.3* 14.7* 8.8  NEUTROABS 15.0*  --  18.0*  --   --   --   HGB 9.0*   < > 7.5* 7.6* 8.2* 8.9*  HCT 27.8*   < > 23.1* 23.0* 25.8* 27.7*  MCV 95.2   < > 95.5 93.9 95.2 93.9  PLT 259   < > 212 212 269 249   < > = values in this interval not displayed.    Scheduled Meds:  carvedilol  3.125 mg Oral BID WC   Chlorhexidine Gluconate Cloth  6 each Topical Q0600   Chlorhexidine Gluconate Cloth  6 each Topical Q0600   heparin injection (subcutaneous)  5,000 Units Subcutaneous Q8H   sevelamer carbonate  800 mg Oral TID WC   sodium chloride flush  5 mL Intracatheter Q8H   Continuous Infusions:  sodium chloride     sodium chloride     ampicillin-sulbactam (UNASYN) IV Stopped (12/14/20 1801)   PRN Meds:.sodium chloride, sodium chloride, acetaminophen **OR** acetaminophen, fentaNYL (SUBLIMAZE) injection, heparin, labetalol, lidocaine (PF), lidocaine-prilocaine, ondansetron (ZOFRAN) IV, oxyCODONE, pentafluoroprop-tetrafluoroeth, polyethylene glycol, traZODone   Gean Quint, MD [place] 12/15/2020, 10:53 AM

## 2020-12-16 LAB — GLUCOSE, CAPILLARY
Glucose-Capillary: 220 mg/dL — ABNORMAL HIGH (ref 70–99)
Glucose-Capillary: 139 mg/dL — ABNORMAL HIGH (ref 70–99)
Glucose-Capillary: 89 mg/dL (ref 70–99)
Glucose-Capillary: 204 mg/dL — ABNORMAL HIGH (ref 70–99)

## 2020-12-16 LAB — AEROBIC/ANAEROBIC CULTURE W GRAM STAIN (SURGICAL/DEEP WOUND)

## 2020-12-16 MED ORDER — CLONIDINE HCL 0.1 MG PO TABS
0.1000 mg | ORAL_TABLET | Freq: Once | ORAL | Status: AC
  Administered 2020-12-16: 0.1 mg via ORAL

## 2020-12-16 MED ORDER — AMOXICILLIN-POT CLAVULANATE 500-125 MG PO TABS
1.0000 | ORAL_TABLET | Freq: Every day | ORAL | Status: DC
  Administered 2020-12-16 – 2020-12-17 (×2): 500 mg via ORAL
  Filled 2020-12-16 (×2): qty 1

## 2020-12-16 MED ORDER — SODIUM CHLORIDE 0.9 % IV SOLN: 2.0000 g | INTRAVENOUS | Status: DC

## 2020-12-16 MED ORDER — CLONIDINE HCL 0.2 MG PO TABS
0.2000 mg | ORAL_TABLET | Freq: Two times a day (BID) | ORAL | Status: DC
  Administered 2020-12-16 – 2020-12-17 (×2): 0.2 mg via ORAL
  Filled 2020-12-16 (×3): qty 1

## 2020-12-16 MED ORDER — AMLODIPINE BESYLATE 5 MG PO TABS
5.0000 mg | ORAL_TABLET | Freq: Once | ORAL | Status: AC
  Administered 2020-12-16: 5 mg via ORAL

## 2020-12-16 MED ORDER — CARVEDILOL 25 MG PO TABS
25.0000 mg | ORAL_TABLET | Freq: Two times a day (BID) | ORAL | Status: DC
  Administered 2020-12-16 – 2020-12-17 (×3): 25 mg via ORAL
  Filled 2020-12-16 (×3): qty 1

## 2020-12-16 MED ORDER — MINOXIDIL 2.5 MG PO TABS
5.0000 mg | ORAL_TABLET | Freq: Every day | ORAL | Status: DC
  Administered 2020-12-16: 5 mg via ORAL
  Filled 2020-12-16 (×2): qty 2

## 2020-12-16 MED ORDER — AMLODIPINE BESYLATE 10 MG PO TABS
10.0000 mg | ORAL_TABLET | Freq: Every day | ORAL | Status: DC
  Administered 2020-12-17: 10 mg via ORAL
  Filled 2020-12-16 (×2): qty 1

## 2020-12-16 NOTE — Progress Notes (Signed)
[facility] Progress Note  Patient: [name]    MGQ:[id number]  DOA: 12/10/2020     Date of Service: the patient was seen and examined on 12/16/2020  Brief hospital course: Past medical history of ESRD on HD MWF, HTN, HFpEF, CAD SP CABG, type II DM.  Presents with complaints of abdominal pain and leukocytosis.  Found to have liver abscess. 6/30 S/P IR guided drain placement and biopsy 7/1 hemoglobin dropped 2 g with hypotension.  Antihypertensive and DVT PX on hold.  CT abdomen negative for any acute injury 7/3 DVT PX resumed 7/4 antihypertensive resumed.  Currently plan is HD tomorrow.  Monitor results of cytology.  Transition to p.o. antibiotics.  Subjective: Pain improving.  No nausea no vomiting.  No fever no chills.  No diarrhea no constipation.  Tolerating oral diet.  Blood pressure still high.  Assessment and Plan: 1.  Liver lesions suspected abscess SP IR ultrasound guided drain placement. Follow-up on cultures and cytology. Initially on IV vancomycin and Zosyn.  Transition to IV Zosyn only. Now on IV Unasyn.  Cultures growing Yersinia.  We will transition to oral Augmentin.  Continuing to drain is in place. Outpatient follow-up with radiology for drain management and further imaging. Outpatient follow-up with PCP regarding antibiotic duration.  2.  Hypotensive episode resolved.  With midodrine Blood pressure dropped significantly on 7/1. Hemoglobin dropped 2 g as well.  Now stable. CT abdomen was performed due to recent procedure.  No acute bleeding. Blood pressure improved with midodrine. Now the blood pressure is likely rather running high therefore midodrine has been discontinued.  3.  ESRD on HD MWF Nephrology consulted. Appreciate assistance.  4.  Constipation. Bowel regimen.  5.  CAD SP CABG HTN Aspirin on hold. Was on Coreg.  Now on hold. Lipitor on hold as well.  6.  Chronic ataxia CT scan, MRI negative for any acute stroke but TSH  normal. B12 normal. As needed meclizine  PT OT consulted.  7.  Anemia of chronic kidney disease. 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 Hemoglobin dropped significantly. As of right now hemoglobin remained stable. Will resume DVT prophylaxis for heparin.  8.  Pleuritic chest pain. Pleural effusion Chest x-ray and x-ray abdomen does not show any evidence of abnormality. CT abdomen yesterday does not show any evidence of PE. Lower extremity Doppler negative for DVT.  Do not think the patient actually has PE. Pleuritic chest pain likely from liver lesion as well as sympathetic right-sided pleural effusion  9.  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 Initiate sliding scale AC at bedtime.  10.  Accelerated hypertension. Resuming home regimen and monitor.  Scheduled Meds:  amLODipine  10 mg Oral Daily   amoxicillin-clavulanate  1 tablet Oral Daily   carvedilol  25 mg Oral BID   Chlorhexidine Gluconate Cloth  6 each Topical Q0600   Chlorhexidine Gluconate Cloth  6 each Topical Q0600   cloNIDine  0.2 mg Oral BID   heparin injection (subcutaneous)  5,000 Units Subcutaneous Q8H   insulin aspart  0-15 Units Subcutaneous TID WC   insulin aspart  0-5 Units Subcutaneous QHS   minoxidil  5 mg Oral QHS   sevelamer carbonate  800 mg Oral TID WC   sodium chloride flush  5 mL Intracatheter Q8H   Continuous Infusions:   PRN Meds: acetaminophen **OR** acetaminophen, fentaNYL (SUBLIMAZE) injection, labetalol, ondansetron (ZOFRAN) IV, oxyCODONE, polyethylene glycol, traZODone  Body mass index is 22.82  kg/m.        DVT Prophylaxis:   heparin injection 5,000 Units Start: 12/14/20 1400 SCDs Start: 12/11/20 0112    Advance goals of care discussion: Pt is Full code.  Family Communication: no family was present at bedside, at the time of interview.   Data Reviewed: I have personally reviewed and  interpreted daily labs, tele strips, mild hyponatremia.  CBC remains elevated.  Blood pressure remains elevated.  Physical Exam: General: Appear in mild distress, no Rash; Oral Mucosa Clear, moist. no Abnormal Neck Mass Or lumps, Conjunctiva normal  Cardiovascular: S1 and S2 Present, no Murmur, Respiratory: good respiratory effort, Bilateral Air entry present and CTA, no Crackles, no wheezes Abdomen: Bowel Sound present, Soft and no tenderness Extremities: no Pedal edema Neurology: alert and oriented to time, place, and person affect appropriate. no new focal deficit Gait not checked due to patient safety concerns   Vitals:   12/16/20 0222 12/16/20 0744 12/16/20 1236 12/16/20 1639  BP: (!) 166/81 (!) 191/78 137/70 (!) 150/72  Pulse: 82 80 76 74  Resp: (!) 22 18 17 20   Temp: 98 F (36.7 C) 98.8 F (37.1 C) 98.4 F (36.9 C) 98.6 F (37 C)  TempSrc:  Oral Oral Oral  SpO2: 93% 94% 95% 97%  Weight:      Height:        Disposition:  Status is: Inpatient  Remains inpatient appropriate because:IV treatments appropriate due to intensity of illness or inability to take PO  Dispo: The patient is from: Home              Anticipated d/c is to: Home after HD tomorrow.              Patient currently is not medically stable to d/c.   Difficult to place patient No        Time spent: 35 minutes. I reviewed all nursing notes, pharmacy notes, vitals, pertinent old records. I have discussed plan of care as described above with RN.  Author: Berle Mull, MD [facility] 12/16/2020 7:17 PM  To reach On-call, see care teams to locate the attending and reach out via [email or web address]. Between 7PM-7AM, please contact night-coverage If you still have difficulty reaching the attending provider, please page the [facility] (Director on Call) for [facility] on amion for assistance.

## 2020-12-16 NOTE — Plan of Care (Signed)
  Problem: Health Behavior/Discharge Planning: Goal: Ability to manage health-related needs will improve Outcome: Progressing   Problem: Clinical Measurements: Goal: Ability to maintain clinical measurements within normal limits will improve Outcome: Progressing Goal: Will remain free from infection Outcome: Progressing Goal: Respiratory complications will improve Outcome: Progressing Goal: Cardiovascular complication will be avoided Outcome: Progressing   Problem: Activity: Goal: Risk for activity intolerance will decrease Outcome: Progressing   Problem: Nutrition: Goal: Adequate nutrition will be maintained Outcome: Progressing   Problem: Elimination: Goal: Will not experience complications related to bowel motility Outcome: Progressing   Problem: Pain Managment: Goal: General experience of comfort will improve Outcome: Progressing

## 2020-12-16 NOTE — Evaluation (Signed)
Physical Therapy Evaluation Patient Details Name: [name] MRN: [id number] DOB: 06-02-1951 Today's Date: 12/16/2020   History of Present Illness  Pt is a 70 y/o male admitted secondary to abdominal pain and vomitting, found to have liver lesions concerning for mets and suspected abscess. Pt is w/p IR ultrasound guided drain placement. PMH including but not limited to diabetes mellitus, ESRD, hypertension, diastolic CHF, CABG (5093).   Clinical Impression  Pt presented supine in bed with HOB elevated, awake and willing to participate in therapy session. Prior to admission, pt reported that he was independent with all functional mobility and ADLs. Pt lives with his wife in a two level home (laundry in the basement) with several steps to enter. He stated that he has no difficulties navigating around his home in within his community independently. At the time of evaluation, pt overall at a supervision to min guard level with functional mobility. Of note, pt initially on 2L of supplemental O2 upon PT arrival with SpO2 at 96%; however, SpO2 decreased to 86% on RA with activity and at rest. PT reapplied supplemental O2 at end of session with SpO2 slowly increasing to >90%. All other VSS throughout. Pt would continue to benefit from skilled physical therapy services at this time while admitted and after d/c to address the below listed limitations in order to improve overall safety and independence with functional mobility.     Follow Up Recommendations Home health PT;Supervision for mobility/OOB    Equipment Recommendations  None recommended by PT    Recommendations for Other Services       Precautions / Restrictions Precautions Precautions: Fall Precaution Comments: abdominal JP drains x2 Restrictions Weight Bearing Restrictions: No      Mobility  Bed Mobility Overal bed mobility: Needs Assistance Bed Mobility: Supine to Sit     Supine to sit: Supervision     General bed mobility  comments: increased time and effort needed, supervision for safety    Transfers Overall transfer level: Needs assistance Equipment used: None Transfers: Sit to/from Stand;Stand Pivot Transfers Sit to Stand: Min guard Stand pivot transfers: Min guard       General transfer comment: min guard for safety and drain management, greatly increased time; pt very slow with pivotal movement from bed to chair towards his left side  Ambulation/Gait                Stairs            Wheelchair Mobility    Modified Rankin (Stroke Patients Only)       Balance Overall balance assessment: Needs assistance Sitting-balance support: Feet supported Sitting balance-Leahy Scale: Good Sitting balance - Comments: pt able to sit upright at EOB for ~10 minutes without a LOB or difficulties   Standing balance support: Single extremity supported Standing balance-Leahy Scale: Poor Standing balance comment: pt reaching for arm rests of the chair throughout transition                             Pertinent Vitals/Pain Pain Assessment: No/denies pain    Home Living Family/patient expects to be discharged to:: Private residence Living Arrangements: Spouse/significant other Available Help at Discharge: Family;Available PRN/intermittently Type of Home: House Home Access: Stairs to enter Entrance Stairs-Rails: Psychiatric nurse of Steps: 5-6 Home Layout: Laundry or work area in [employer]: None      Prior Function Level of Independence: Independent  Comments: "I was doing everything I wanted to do"     Hand Dominance        Extremity/Trunk Assessment   Upper Extremity Assessment Upper Extremity Assessment: Overall WFL for tasks assessed    Lower Extremity Assessment Lower Extremity Assessment: Generalized weakness       Communication   Communication: No difficulties  Cognition Arousal/Alertness: Awake/alert Behavior  During Therapy: WFL for tasks assessed/performed Overall Cognitive Status: No family/caregiver present to determine baseline cognitive functioning Area of Impairment: Awareness;Safety/judgement                         Safety/Judgement: Decreased awareness of safety Awareness: Emergent          General Comments      Exercises     Assessment/Plan    PT Assessment Patient needs continued PT services  PT Problem List Decreased strength;Decreased activity tolerance;Decreased balance;Decreased mobility;Decreased coordination;Decreased knowledge of use of DME;Decreased safety awareness;Decreased knowledge of precautions       PT Treatment Interventions DME instruction;Gait training;Stair training;Functional mobility training;Therapeutic activities;Therapeutic exercise;Balance training;Neuromuscular re-education;Patient/family education    PT Goals (Current goals can be found in the Care Plan section)  Acute Rehab PT Goals Patient Stated Goal: return to independence PT Goal Formulation: With patient Time For Goal Achievement: 12/30/20 Potential to Achieve Goals: Good    Frequency Min 3X/week   Barriers to discharge        Co-evaluation               AM-PAC PT "6 Clicks" Mobility  Outcome Measure Help needed turning from your back to your side while in a flat bed without using bedrails?: None Help needed moving from lying on your back to sitting on the side of a flat bed without using bedrails?: None Help needed moving to and from a bed to a chair (including a wheelchair)?: A Little Help needed standing up from a chair using your arms (e.g., wheelchair or bedside chair)?: None Help needed to walk in hospital room?: A Little Help needed climbing 3-5 steps with a railing? : A Little 6 Click Score: 21    End of Session   Activity Tolerance: Patient limited by fatigue Patient left: in chair;with call bell/phone within reach;with chair alarm set Nurse  Communication: Mobility status PT Visit Diagnosis: Other abnormalities of gait and mobility (R26.89);Muscle weakness (generalized) (M62.81)    Time: 1610-9604 PT Time Calculation (min) (ACUTE ONLY): 23 min   Charges:   PT Evaluation $PT Eval Low Complexity: 1 Low PT Treatments $Therapeutic Activity: 8-22 mins        Anastasio Champion, DPT  Acute Rehabilitation Services Office [place] 12/16/2020, 12:18 PM

## 2020-12-16 NOTE — Progress Notes (Signed)
[facility] PROGRESS NOTE  Assessment/ Plan: Pt is a 70 y.o. yo male  with past medical history of HTN, DM, CAD status post CABG, diastolic CHF, ESRD on HD MWF at [facility] presented with abdominal pain, nausea vomiting seen as a consultation for the management of ESRD.  #Liver abscess concerning for possible metastatic necrosis: Status post CT-guided drainage of purulent fluid by IR on 6/30.  Now on augmentin.  Further plan per IR and primary team.   # ESRD: MWF at [facility] in [place].  [place] hd well, next treatment on 7/6   # Hypertension: uncontrolled, on clonidine, norvasc, coreg, minoxidil. Monitor blood pressure. Will increase UF goal for tomorrow   # Anemia of ESRD: Hemoglobin is below goal.  No IV iron because of infection/sepsis.  I will hold ESA because of concern for liver mass.   # Metabolic Bone Disease: Continue Renvela.  Monitor calcium and phosphorus level.  Subjective: Seen and examined.  No acute events. Tolerated hd yesterday, net uf 1.5L.  Objective Vital signs in last 24 hours: Vitals:   12/15/20 1952 12/15/20 2357 12/16/20 0222 12/16/20 0744  BP: (!) 177/75 (!) 159/69 (!) 166/81 (!) 191/78  Pulse: 85 90 82 80  Resp: (!) 25 20 (!) 22 18  Temp: 99.4 F (37.4 C) 98.4 F (36.9 C) 98 F (36.7 C) 98.8 F (37.1 C)  TempSrc: Oral Oral  Oral  SpO2: 91% (!) 89% 93% 94%  Weight:      Height:       Weight change:   Intake/Output Summary (Last 24 hours) at 12/16/2020 1106 Last data filed at 12/16/2020 0655 Gross per 24 hour  Intake --  Output 1012 ml  Net -1012 ml       Labs: Basic Metabolic Panel: Recent Labs  Lab 12/12/20 0607 12/12/20 0805 12/13/20 0504 12/15/20 0640  NA 131*  --  134* 127*  K 4.2  --  3.9 3.9  CL 90*  --  91* 89*  CO2 23  --  25 24  GLUCOSE 330*  --  216* 292*  BUN 41*  --  26* 47*  CREATININE 7.40*  --  4.77* 7.57*  CALCIUM 8.2*  --  8.9 8.6*  PHOS  --  5.1*  --   --    Liver  Function Tests: Recent Labs  Lab 12/10/20 1815 12/11/20 0330 12/12/20 0607  AST 79* 43* 134*  ALT 81* 67* 99*  ALKPHOS 140* 129* 96  BILITOT 0.8 0.8 1.5*  PROT 7.5 7.0 5.6*  ALBUMIN 2.5* 2.4* 1.8*   Recent Labs  Lab 12/10/20 1815  LIPASE 148*   No results for input(s): AMMONIA in the last 168 hours. CBC: Recent Labs  Lab 12/10/20 1815 12/11/20 0330 12/12/20 0607 12/12/20 0805 12/13/20 0504 12/15/20 0640  WBC 16.7* 16.6* 19.8* 18.3* 14.7* 8.8  NEUTROABS 15.0*  --  18.0*  --   --   --   HGB 9.0* 9.3* 7.5* 7.6* 8.2* 8.9*  HCT 27.8* 28.9* 23.1* 23.0* 25.8* 27.7*  MCV 95.2 96.7 95.5 93.9 95.2 93.9  PLT 259 248 212 212 269 249   Cardiac Enzymes: No results for input(s): CKTOTAL, CKMB, CKMBINDEX, TROPONINI in the last 168 hours. CBG: Recent Labs  Lab 12/15/20 1325 12/15/20 1646 12/15/20 2143 12/16/20 0742  GLUCAP 154* 160* 169* 220*    Iron Studies:  No results for input(s): IRON, TIBC, TRANSFERRIN, FERRITIN in the last 72 hours. Studies/Results: DG ABD ACUTE 2+V W 1V CHEST  Result Date: 12/14/2020 CLINICAL DATA:  Abdominal pain.  Pleuritic chest pain. EXAM: DG ABDOMEN ACUTE WITH 1 VIEW CHEST COMPARISON:  Abdominopelvic CT 2 days ago 12/12/2020. Most recent chest radiograph 12/04/2020 FINDINGS: Right pleural effusion and basilar atelectasis, grossly unchanged from recent CT allowing for differences in technique. Small left pleural effusion and basilar atelectasis. Patient is post median sternotomy. Unchanged cardiomegaly. No pneumothorax. Right upper quadrant drain not significantly changed in appearance from recent CT, located within within the liver. Midline catheter also unchanged in position, located in the liver on CT. There is no free intra-abdominal air. Enteric contrast within stool throughout the colon, no obstruction. Average volume of colonic stool. Stable osseous structures. IMPRESSION: 1. Right greater than left pleural effusion and bibasilar atelectasis,  grossly stable from recent imaging. 2. Two drainage catheters in the upper abdomen, located within hepatic collections on recent CT. Normal bowel gas pattern. Electronically Signed   By: Keith Rake M.D.   On: 12/14/2020 16:59   VAS [place] LOWER EXTREMITY VENOUS (DVT)  Result Date: 12/15/2020  Lower Venous DVT Study Patient Name:  [name]  Date of Exam:   12/15/2020 Medical Rec #: [id number]        Accession #:    [id number] Date of Birth: [birth year]        Patient Gender: M Patient Age:   70Y Exam Location:  [facility] Procedure:      VAS [place] LOWER EXTREMITY VENOUS (DVT) Referring Phys: [id number] [facility] M PATEL --------------------------------------------------------------------------------  Indications: Concern for malignancy and resultant DVT, prophylaxis temporarily paused due to low hemoglobin.  Limitations: Acoustic shadowing secondary to heavy arterial calcification. Comparison Study: No prior studies. Performing Technologist: Darlin Coco RDMS,RVT  Examination Guidelines: A complete evaluation includes B-mode imaging, spectral Doppler, color Doppler, and power Doppler as needed of all accessible portions of each vessel. Bilateral testing is considered an integral part of a complete examination. Limited examinations for reoccurring indications may be performed as noted. The reflux portion of the exam is performed with the patient in reverse Trendelenburg.  +---------+---------------+---------+-----------+----------+--------------+ RIGHT    CompressibilityPhasicitySpontaneityPropertiesThrombus Aging +---------+---------------+---------+-----------+----------+--------------+ CFV      Full           Yes      Yes                                 +---------+---------------+---------+-----------+----------+--------------+ SFJ      Full                                                        +---------+---------------+---------+-----------+----------+--------------+ FV Prox  Full                                                         +---------+---------------+---------+-----------+----------+--------------+ FV Mid   Full                                                        +---------+---------------+---------+-----------+----------+--------------+  FV DistalFull                                                        +---------+---------------+---------+-----------+----------+--------------+ PFV      Full                                                        +---------+---------------+---------+-----------+----------+--------------+ POP      Full           Yes      Yes                                 +---------+---------------+---------+-----------+----------+--------------+ PTV      Full                                                        +---------+---------------+---------+-----------+----------+--------------+ PERO     Full                                                        +---------+---------------+---------+-----------+----------+--------------+   +---------+---------------+---------+-----------+----------+--------------+ LEFT     CompressibilityPhasicitySpontaneityPropertiesThrombus Aging +---------+---------------+---------+-----------+----------+--------------+ CFV      Full           Yes      Yes                                 +---------+---------------+---------+-----------+----------+--------------+ SFJ      Full                                                        +---------+---------------+---------+-----------+----------+--------------+ FV Prox  Full                                                        +---------+---------------+---------+-----------+----------+--------------+ FV Mid   Full                                                        +---------+---------------+---------+-----------+----------+--------------+ FV DistalFull                                                         +---------+---------------+---------+-----------+----------+--------------+  PFV      Full                                                        +---------+---------------+---------+-----------+----------+--------------+ POP      Full           Yes      Yes                                 +---------+---------------+---------+-----------+----------+--------------+ PTV      Full                                                        +---------+---------------+---------+-----------+----------+--------------+ PERO     Full                                                        +---------+---------------+---------+-----------+----------+--------------+    Summary: RIGHT: - There is no evidence of deep vein thrombosis in the lower extremity.  - No cystic structure found in the popliteal fossa.  - Incidental: 50-74% stenosis of distal SFA, ATA appears occluded, PTA/Pero A patent.  LEFT: - There is no evidence of deep vein thrombosis in the lower extremity.  - A cystic structure is found in the popliteal fossa.  - Incidental: 50-74% stenosis of SFA, ATA appears occluded, PTA patent, monophasic.  *See table(s) above for measurements and observations.    Preliminary     Medications: Infusions:    Scheduled Medications:  amLODipine  10 mg Oral Daily   amLODipine  5 mg Oral Once   amoxicillin-clavulanate  1 tablet Oral Daily   carvedilol  25 mg Oral BID   Chlorhexidine Gluconate Cloth  6 each Topical Q0600   Chlorhexidine Gluconate Cloth  6 each Topical Q0600   cloNIDine  0.1 mg Oral Once   cloNIDine  0.2 mg Oral BID   heparin injection (subcutaneous)  5,000 Units Subcutaneous Q8H   insulin aspart  0-15 Units Subcutaneous TID WC   insulin aspart  0-5 Units Subcutaneous QHS   minoxidil  5 mg Oral QHS   sevelamer carbonate  800 mg Oral TID WC   sodium chloride flush  5 mL Intracatheter Q8H    have reviewed scheduled and prn medications.  Physical Exam: General:NAD,  lying flat comfortable Heart:RRR, s1s2 nl Lungs: Clear b/l, no crackle Abdomen:soft,  non-distended Extremities:No LE edema Dialysis Access: AV fistula has good thrill and bruit.  Naveen Clardy 12/16/2020,11:06 AM  LOS: 6 days

## 2020-12-17 DIAGNOSIS — N186 End stage renal disease: Secondary | ICD-10-CM

## 2020-12-17 DIAGNOSIS — K750 Abscess of liver: Principal | ICD-10-CM

## 2020-12-17 DIAGNOSIS — E1169 Type 2 diabetes mellitus with other specified complication: Secondary | ICD-10-CM

## 2020-12-17 LAB — CYTOLOGY - NON PAP

## 2020-12-17 LAB — CULTURE, BLOOD (ROUTINE X 2)
Culture: NO GROWTH
Culture: NO GROWTH
Culture: NO GROWTH
Culture: NO GROWTH
Special Requests: ADEQUATE
Special Requests: ADEQUATE

## 2020-12-17 LAB — BASIC METABOLIC PANEL
Anion gap: 12 (ref 5–15)
BUN: 13 mg/dL (ref 8–23)
CO2: 27 mmol/L (ref 22–32)
Calcium: 8.6 mg/dL — ABNORMAL LOW (ref 8.9–10.3)
Chloride: 95 mmol/L — ABNORMAL LOW (ref 98–111)
Creatinine, Ser: 3.76 mg/dL — ABNORMAL HIGH (ref 0.61–1.24)
GFR, Estimated: 17 mL/min — ABNORMAL LOW (ref >60)
Glucose, Bld: 95 mg/dL (ref 70–99)
Potassium: 3.9 mmol/L (ref 3.5–5.1)
Sodium: 134 mmol/L — ABNORMAL LOW (ref 135–145)

## 2020-12-17 LAB — CBC
HCT: 26.4 % — ABNORMAL LOW (ref 39.0–52.0)
Hemoglobin: 8.2 g/dL — ABNORMAL LOW (ref 13.0–17.0)
MCH: 29.7 pg (ref 26.0–34.0)
MCHC: 31.1 g/dL (ref 30.0–36.0)
MCV: 95.7 fL (ref 80.0–100.0)
Platelets: 237 10*3/uL (ref 150–400)
RBC: 2.76 MIL/uL — ABNORMAL LOW (ref 4.22–5.81)
RDW: 15.5 % (ref 11.5–15.5)
WBC: 7.8 10*3/uL (ref 4.0–10.5)
nRBC: 0 % (ref 0.0–0.2)

## 2020-12-17 LAB — MAGNESIUM: Magnesium: 2.1 mg/dL (ref 1.7–2.4)

## 2020-12-17 LAB — GLUCOSE, CAPILLARY
Glucose-Capillary: 163 mg/dL — ABNORMAL HIGH (ref 70–99)
Glucose-Capillary: 92 mg/dL (ref 70–99)

## 2020-12-17 MED ORDER — AMOXICILLIN-POT CLAVULANATE 500-125 MG PO TABS: 1.0000 | ORAL_TABLET | Freq: Every day | ORAL | 0 refills | Status: DC

## 2020-12-17 NOTE — TOC Progression Note (Signed)
Transition of Care [facility]) - Progression Note    Patient Details  Name: [name] MRN: [id number] Date of Birth: 27-Dec-1950  [facility] CM/SW Contact  Loletha Grayer Beverely Pace, RN Phone Number: 12/17/2020, 4:17 PM  Clinical Narrative: Patient is a 70 yr old gentleman who Presented with complaints of abdominal pain and leukocytosis.  Found to have liver abscess. Patient will discharge with drain. Case Manager spoke with patient concerning discharge needs. CM received permission to locate a [place]. Case manager has contacted [facility], [place] and [facility]. As of this time neither of them can accept patient due to staffing in his location. He lives in [place]. Case Manager has informed Dr.Patel of the same. CM has asked Bedside RN to be sure that patient and wife are instructed on how to empty drain and care of drain. Case manager asked Ramond Marrow, [facility] Liaison to follow up to see if they can accept patient next week.    Expected Discharge Plan: [place] Barriers to Discharge: No Barriers Identified  Expected Discharge Plan and Services Expected Discharge Plan: [place] In-house Referral: NA Discharge Planning Services: CM Consult Post Acute Care Choice: [place] arrangements for the past 2 months: Single Family Home Expected Discharge Date: 12/17/20               DME Arranged: N/A DME Agency: NA       HH Arranged: RN, PT [place] Agency: [place] ([place]) Date HH Agency Contacted: 12/17/20 Time HH Agency Contacted: 1600 Representative spoke with at [place]: [place] ([place]) Interventions    Readmission Risk Interventions No flowsheet data found.

## 2020-12-17 NOTE — Consult Note (Signed)
[place] for Infectious Disease    Date of Admission:  12/10/2020     Total days of antibiotics 8               Reason for Consult: Liver Abscess  Referring Provider: Posey Pronto  Primary Care Provider: [facility]   ASSESSMENT:  [name] is a 70 y/o male with ESRD on hemodialysis admitted with abdominal pain and nausea and found to have liver abscesses. POD 6 from drain insertion by IR and cultures obtained growing Yersinia enterocolitica group. Agree with current regimen of Augmentin. Being discharged with drains in place. Discussed need to continue with Augmentin until abscesses are radiologically resolved. Will initially plan for 4 weeks. Plan of care to follow up with IR and then ID.  PLAN:  Continue Augmentin until abscesses are radiologically resolved.  Drain management per IR.  Will schedule follow up in ID clinic. [place] for discharge from ID standpoint.    Principal Problem:   Liver lesions (Infection Vs Metastasis) Active Problems:   End stage renal disease (HCC)   S/P CABG (coronary artery bypass graft)   DM (diabetes mellitus) (HCC)    amLODipine  10 mg Oral Daily   amoxicillin-clavulanate  1 tablet Oral Daily   carvedilol  25 mg Oral BID   Chlorhexidine Gluconate Cloth  6 each Topical Q0600   Chlorhexidine Gluconate Cloth  6 each Topical Q0600   cloNIDine  0.2 mg Oral BID   heparin injection (subcutaneous)  5,000 Units Subcutaneous Q8H   insulin aspart  0-15 Units Subcutaneous TID WC   insulin aspart  0-5 Units Subcutaneous QHS   minoxidil  5 mg Oral QHS   sevelamer carbonate  800 mg Oral TID WC   sodium chloride flush  5 mL Intracatheter Q8H     HPI: [name] is a 70 y.o. male with previous medical history of Type 2 diabetes, ESRD on dialysis, hypertension, and CAD s/p CABG admitted with 1 month history of abdominal pain and vomiting.  [name]'s CT abdomen/pelvis on 12/10/20 with numerous irregular  low-density lesions throughout the liver with largest in the posterior right hepatic lobe measuring 7.4 cm with concern for abscesses or metastases. IR inserted 2 percutaneous drains on 6/30 draining 80 cc of purulent material from the dominant abscess of the right lobe and 25 cc of purulent fluid from the dominant abscess of the left lobe. Abscess cultures are positive for Yersinia enterocolitica group.  [name] has been afebrile since 12/11/20. Currently on Day 8 of antimicrobial therapy. Initially received broad spectrum coverage with vancomycin, cefepime, metronidazole and pip/tazo. This was narrowed to pip/tazo from 6/30 until 7/2 and then to Unasyn from 7/3-7/4 and is currently receiving Augmentin which was started on 7/5. Being discharged with drains in place and follow up with IR. ID has been asked to make antibiotic recommendations.    Review of Systems: Review of Systems  Constitutional:  Negative for chills, fever and weight loss.  Respiratory:  Negative for cough, shortness of breath and wheezing.   Cardiovascular:  Negative for chest pain and leg swelling.  Gastrointestinal:  Negative for abdominal pain, constipation, diarrhea, nausea and vomiting.  Skin:  Negative for rash.    Past Medical History:  Diagnosis Date   Arthritis    CAD (coronary artery disease)    STEMI with LAD stent by [facility] in 2005   Dependence on renal dialysis [facility])    Depression  Diabetes mellitus without complication ([place])    History of kidney stones    Hypertension    Kidney infection    MI (mitral incompetence)    Myocardial infarction ([place])    Seasonal allergies     Social History   Tobacco Use   Smoking status: Never   Smokeless tobacco: Former    Types: Chew    Quit date: 06/14/2010  Vaping Use   Vaping Use: Never used  Substance Use Topics   Alcohol use: No   Drug use: No    Family History  Problem Relation Age of Onset   Diabetes Mother    Hypertension Mother     Diabetes Father    Hypertension Father    Diabetes Other     Allergies  Allergen Reactions   Shellfish Allergy Hives    OBJECTIVE: Blood pressure (!) 159/63, pulse 83, temperature 98.2 F (36.8 C), temperature source Oral, resp. rate 20, height 6\' 5"  (1.956 m), weight 86.5 kg, SpO2 97 %.  Physical Exam Constitutional:      General: He is not in acute distress.    Appearance: He is well-developed.     Interventions: Nasal cannula in place.     Comments: Lying in bed with head of bed elevated.   Cardiovascular:     Rate and Rhythm: Normal rate and regular rhythm.     Heart sounds: Normal heart sounds.  Pulmonary:     Effort: Pulmonary effort is normal.     Breath sounds: Normal breath sounds.  Abdominal:     Comments: Abdominal drains in place.   Skin:    General: Skin is warm and dry.  Neurological:     Mental Status: He is alert and oriented to person, place, and time.  Psychiatric:        Mood and Affect: Mood normal.    Lab Results Lab Results  Component Value Date   WBC 8.8 12/15/2020   HGB 8.9 (L) 12/15/2020   HCT 27.7 (L) 12/15/2020   MCV 93.9 12/15/2020   PLT 249 12/15/2020    Lab Results  Component Value Date   CREATININE 3.76 (H) 12/17/2020   BUN 13 12/17/2020   NA 134 (L) 12/17/2020   K 3.9 12/17/2020   CL 95 (L) 12/17/2020   CO2 27 12/17/2020    Lab Results  Component Value Date   ALT 99 (H) 12/12/2020   AST 134 (H) 12/12/2020   ALKPHOS 96 12/12/2020   BILITOT 1.5 (H) 12/12/2020     Microbiology: Recent Results (from the past 240 hour(s))  Blood culture (routine x 2)     Status: None   Collection Time: 12/10/20  8:45 PM   Specimen: BLOOD RIGHT ARM  Result Value Ref Range Status   Specimen Description BLOOD RIGHT ARM  Final   Special Requests   Final    BOTTLES DRAWN AEROBIC AND ANAEROBIC Blood Culture results may not be optimal due to an inadequate volume of blood received in culture bottles   Culture   Final    NO GROWTH 7  DAYS Performed at [facility], [address] [place], [place] [address]    Report Status 12/17/2020 FINAL  Final  SARS CORONAVIRUS 2 (TAT 6-24 HRS) Nasopharyngeal Nasopharyngeal Swab     Status: None   Collection Time: 12/10/20  9:02 PM   Specimen: Nasopharyngeal Swab  Result Value Ref Range Status   SARS Coronavirus 2 NEGATIVE NEGATIVE Final    Comment: (NOTE) SARS-CoV-2 target nucleic  acids are NOT DETECTED.  The SARS-CoV-2 RNA is generally detectable in upper and lower respiratory specimens during the acute phase of infection. Negative results do not preclude SARS-CoV-2 infection, do not rule out co-infections with other pathogens, and should not be used as the sole basis for treatment or other patient management decisions. Negative results must be combined with clinical observations, patient history, and epidemiological information. The expected result is Negative.  Fact Sheet for Patients: SugarRoll.be  Fact Sheet for Healthcare Providers: https://www.woods-mathews.com/  This test is not yet approved or cleared by the [place] FDA and  has been authorized for detection and/or diagnosis of SARS-CoV-2 by FDA under an Emergency Use Authorization (EUA). This EUA will remain  in effect (meaning this test can be used) for the duration of the COVID-19 declaration under Se ction 564(b)(1) of the Act, 21 U.S.C. section 360bbb-3(b)(1), unless the authorization is terminated or revoked sooner.  Performed at [facility], [place] [address] [place], [place] [address]   Blood culture (routine x 2)     Status: None   Collection Time: 12/10/20 10:03 PM   Specimen: BLOOD RIGHT HAND  Result Value Ref Range Status   Specimen Description BLOOD RIGHT HAND  Final   Special Requests   Final    BOTTLES DRAWN AEROBIC AND ANAEROBIC Blood Culture results may not be optimal due to an inadequate volume of blood received in culture bottles    Culture   Final    NO GROWTH 7 DAYS Performed at [facility], [address], [place], [place] [address]    Report Status 12/17/2020 FINAL  Final  Blood culture (routine x 2)     Status: None   Collection Time: 12/11/20  9:09 AM   Specimen: BLOOD  Result Value Ref Range Status   Specimen Description BLOOD RIGHT HAND  Final   Special Requests   Final    BOTTLES DRAWN AEROBIC AND ANAEROBIC Blood Culture adequate volume   Culture   Final    NO GROWTH 6 DAYS Performed at [facility], [address] [place], [place] [address]    Report Status 12/17/2020 FINAL  Final  Blood culture (routine x 2)     Status: None   Collection Time: 12/11/20  9:09 AM   Specimen: BLOOD  Result Value Ref Range Status   Specimen Description BLOOD RIGHT WRIST  Final   Special Requests   Final    BOTTLES DRAWN AEROBIC AND ANAEROBIC Blood Culture adequate volume   Culture   Final    NO GROWTH 6 DAYS Performed at [facility], [address] [place], [place] [address]    Report Status 12/17/2020 FINAL  Final  Aerobic/Anaerobic Culture w Gram Stain (surgical/deep wound)     Status: None   Collection Time: 12/11/20  3:34 PM   Specimen: Abscess  Result Value Ref Range Status   Specimen Description ABSCESS  Final   Special Requests DRAIN  Final   Gram Stain   Final    MODERATE WBC PRESENT, PREDOMINANTLY PMN FEW GRAM NEGATIVE RODS    Culture   Final    FEW YERSINIA ENTEROCOLITICA GROUP NO ANAEROBES ISOLATED Performed at [facility], [place] [address] [place], [place] [address]    Report Status 12/16/2020 FINAL  Final   Organism ID, Bacteria YERSINIA ENTEROCOLITICA GROUP  Final      Susceptibility   Yersinia enterocolitica group - MIC*    AMPICILLIN >=32 RESISTANT Resistant     CEFAZOLIN  RESISTANT Resistant     CEFEPIME <=0.12 SENSITIVE Sensitive     CEFTAZIDIME <=1 SENSITIVE Sensitive     CEFTRIAXONE <=0.25 SENSITIVE Sensitive     CIPROFLOXACIN <=0.25 SENSITIVE Sensitive     GENTAMICIN <=1  SENSITIVE Sensitive     IMIPENEM <=0.25 SENSITIVE Sensitive     TRIMETH/SULFA <=20 SENSITIVE Sensitive     AMPICILLIN/SULBACTAM 4 SENSITIVE Sensitive     PIP/TAZO <=4 SENSITIVE Sensitive     * FEW YERSINIA ENTEROCOLITICA GROUP  MRSA Next Gen by PCR, Nasal     Status: None   Collection Time: 12/15/20  5:10 AM   Specimen: Nasal Mucosa; Nasal Swab  Result Value Ref Range Status   MRSA by PCR Next Gen NOT DETECTED NOT DETECTED Final    Comment: (NOTE) The GeneXpert MRSA Assay (FDA approved for NASAL specimens only), is one component of a comprehensive MRSA colonization surveillance program. It is not intended to diagnose MRSA infection nor to guide or monitor treatment for MRSA infections. Test performance is not FDA approved in patients less than 54 years old. Performed at [facility], [place] [address], [place], [place] [address]      Terri Piedra, [place] for Infectious Disease [facility]  12/17/2020  3:50 PM

## 2020-12-17 NOTE — Progress Notes (Signed)
   Pt being DC today to home  He will need to flush drains daily 5 cc sterile saline Record OP daily  He will hear from IR with time and date of OP follow up In [facility]

## 2020-12-17 NOTE — Progress Notes (Signed)
[facility] NEPHROLOGY PROGRESS NOTE  Assessment/ Plan: Pt is a 70 y.o. yo male  with past medical history of HTN, DM, CAD status post CABG, diastolic CHF, ESRD on HD MWF at [facility] presented with abdominal pain, nausea vomiting seen as a consultation for the management of ESRD.  #Liver abscess concerning for possible metastatic necrosis: Status post CT-guided drainage of purulent fluid by IR on 6/30.  Now on augmentin.  Further plan per IR and primary team.   # ESRD: MWF at [facility] in [place].  [place] hd well, next treatment on 7/8   # Hypertension: uncontrolled but improved, on clonidine, norvasc, coreg, minoxidil. Monitor blood pressure. Increase uf goal   # Anemia of ESRD: Hemoglobin is below goal.  No IV iron because of infection/sepsis.  I will hold ESA because of concern for liver mass (cytology pending)   # Metabolic Bone Disease: Continue Renvela.  Monitor calcium and phosphorus level.  # Hyponatremia: managing with HD  Subjective: Seen and examined in HD.  No acute events. Tolerating hd today, no complaints. Ufg 2500  Objective Vital signs in last 24 hours: Vitals:   12/17/20 0855 12/17/20 0902 12/17/20 0930 12/17/20 1000  BP: (!) 165/68 (!) 159/77 (!) 157/69 134/64  Pulse: 78 76 76 76  Resp: 20     Temp: 98.1 F (36.7 C)     TempSrc: Oral     SpO2: 99%     Weight: 88.5 kg     Height:       Weight change: -0.9 kg  Intake/Output Summary (Last 24 hours) at 12/17/2020 1036 Last data filed at 12/17/2020 0600 Gross per 24 hour  Intake 270 ml  Output 40 ml  Net 230 ml       Labs: Basic Metabolic Panel: Recent Labs  Lab 12/12/20 0607 12/12/20 0805 12/13/20 0504 12/15/20 0640  NA 131*  --  134* 127*  K 4.2  --  3.9 3.9  CL 90*  --  91* 89*  CO2 23  --  25 24  GLUCOSE 330*  --  216* 292*  BUN 41*  --  26* 47*  CREATININE 7.40*  --  4.77* 7.57*  CALCIUM 8.2*  --  8.9 8.6*  PHOS  --  5.1*  --   --    Liver Function  Tests: Recent Labs  Lab 12/10/20 1815 12/11/20 0330 12/12/20 0607  AST 79* 43* 134*  ALT 81* 67* 99*  ALKPHOS 140* 129* 96  BILITOT 0.8 0.8 1.5*  PROT 7.5 7.0 5.6*  ALBUMIN 2.5* 2.4* 1.8*   Recent Labs  Lab 12/10/20 1815  LIPASE 148*   No results for input(s): AMMONIA in the last 168 hours. CBC: Recent Labs  Lab 12/10/20 1815 12/11/20 0330 12/12/20 0607 12/12/20 0805 12/13/20 0504 12/15/20 0640  WBC 16.7* 16.6* 19.8* 18.3* 14.7* 8.8  NEUTROABS 15.0*  --  18.0*  --   --   --   HGB 9.0* 9.3* 7.5* 7.6* 8.2* 8.9*  HCT 27.8* 28.9* 23.1* 23.0* 25.8* 27.7*  MCV 95.2 96.7 95.5 93.9 95.2 93.9  PLT 259 248 212 212 269 249   Cardiac Enzymes: No results for input(s): CKTOTAL, CKMB, CKMBINDEX, TROPONINI in the last 168 hours. CBG: Recent Labs  Lab 12/16/20 0742 12/16/20 1234 12/16/20 1638 12/16/20 2056 12/17/20 0820  GLUCAP 220* 139* 89 204* 163*    Iron Studies:  No results for input(s): IRON, TIBC, TRANSFERRIN, FERRITIN in the last 72 hours. Studies/Results: VAS [place] LOWER EXTREMITY  VENOUS (DVT)  Result Date: 12/16/2020  Lower Venous DVT Study Patient Name:  [name]  Date of Exam:   12/15/2020 Medical Rec #: [id number]        Accession #:    [id number] Date of Birth: 1950/10/04        Patient Gender: M Patient Age:   70Y Exam Location:  [facility] Procedure:      VAS [place] LOWER EXTREMITY VENOUS (DVT) Referring Phys: [id number] [facility] M PATEL --------------------------------------------------------------------------------  Indications: Concern for malignancy and resultant DVT, prophylaxis temporarily paused due to low hemoglobin.  Limitations: Acoustic shadowing secondary to heavy arterial calcification. Comparison Study: No prior studies. Performing Technologist: Darlin Coco RDMS,RVT  Examination Guidelines: A complete evaluation includes B-mode imaging, spectral Doppler, color Doppler, and power Doppler as needed of all accessible portions of each vessel. Bilateral  testing is considered an integral part of a complete examination. Limited examinations for reoccurring indications may be performed as noted. The reflux portion of the exam is performed with the patient in reverse Trendelenburg.  +---------+---------------+---------+-----------+----------+--------------+ RIGHT    CompressibilityPhasicitySpontaneityPropertiesThrombus Aging +---------+---------------+---------+-----------+----------+--------------+ CFV      Full           Yes      Yes                                 +---------+---------------+---------+-----------+----------+--------------+ SFJ      Full                                                        +---------+---------------+---------+-----------+----------+--------------+ FV Prox  Full                                                        +---------+---------------+---------+-----------+----------+--------------+ FV Mid   Full                                                        +---------+---------------+---------+-----------+----------+--------------+ FV DistalFull                                                        +---------+---------------+---------+-----------+----------+--------------+ PFV      Full                                                        +---------+---------------+---------+-----------+----------+--------------+ POP      Full           Yes      Yes                                 +---------+---------------+---------+-----------+----------+--------------+  PTV      Full                                                        +---------+---------------+---------+-----------+----------+--------------+ PERO     Full                                                        +---------+---------------+---------+-----------+----------+--------------+   +---------+---------------+---------+-----------+----------+--------------+ LEFT      CompressibilityPhasicitySpontaneityPropertiesThrombus Aging +---------+---------------+---------+-----------+----------+--------------+ CFV      Full           Yes      Yes                                 +---------+---------------+---------+-----------+----------+--------------+ SFJ      Full                                                        +---------+---------------+---------+-----------+----------+--------------+ FV Prox  Full                                                        +---------+---------------+---------+-----------+----------+--------------+ FV Mid   Full                                                        +---------+---------------+---------+-----------+----------+--------------+ FV DistalFull                                                        +---------+---------------+---------+-----------+----------+--------------+ PFV      Full                                                        +---------+---------------+---------+-----------+----------+--------------+ POP      Full           Yes      Yes                                 +---------+---------------+---------+-----------+----------+--------------+ PTV      Full                                                        +---------+---------------+---------+-----------+----------+--------------+  PERO     Full                                                        +---------+---------------+---------+-----------+----------+--------------+     Summary: RIGHT: - There is no evidence of deep vein thrombosis in the lower extremity.  - No cystic structure found in the popliteal fossa.  - Incidental: 50-74% stenosis of distal SFA, ATA appears occluded, PTA/Pero A patent.  LEFT: - There is no evidence of deep vein thrombosis in the lower extremity.  - A cystic structure is found in the popliteal fossa.  - Incidental: 50-74% stenosis of SFA, ATA appears occluded, PTA patent,  monophasic.  *See table(s) above for measurements and observations. Electronically signed by Ruta Hinds MD on 12/16/2020 at 3:49:48 PM.    Final     Medications: Infusions:    Scheduled Medications:  amLODipine  10 mg Oral Daily   amoxicillin-clavulanate  1 tablet Oral Daily   carvedilol  25 mg Oral BID   Chlorhexidine Gluconate Cloth  6 each Topical Q0600   Chlorhexidine Gluconate Cloth  6 each Topical Q0600   cloNIDine  0.2 mg Oral BID   heparin injection (subcutaneous)  5,000 Units Subcutaneous Q8H   insulin aspart  0-15 Units Subcutaneous TID WC   insulin aspart  0-5 Units Subcutaneous QHS   minoxidil  5 mg Oral QHS   sevelamer carbonate  800 mg Oral TID WC   sodium chloride flush  5 mL Intracatheter Q8H    have reviewed scheduled and prn medications.  Physical Exam: General:NAD, lying flat comfortable Heart:RRR, s1s2 nl Lungs: Clear b/l, no crackle Abdomen:soft,  non-distended Extremities:No LE edema Dialysis Access: AV fistula has good thrill and bruit.  Anella Nakata 12/17/2020,10:36 AM  LOS: 7 days

## 2020-12-18 ENCOUNTER — Other Ambulatory Visit: Payer: Self-pay | Admitting: Internal Medicine

## 2020-12-18 DIAGNOSIS — K750 Abscess of liver: Secondary | ICD-10-CM

## 2020-12-19 NOTE — Discharge Summary (Signed)
[facility] Discharge Summary   Patient: [name] PJS:[id number]  PCP: [facility]  Date of admission: 12/10/2020   Date of discharge: 12/17/2020      Discharge Diagnoses:  Principal Problem:   Liver lesions (Infection Vs Metastasis) Active Problems:   End stage renal disease ([place])   S/P CABG (coronary artery bypass graft)   DM (diabetes mellitus) ([place])   Admitted From: home Disposition:  Home with home health  Recommendations for Outpatient Follow-up:  PCP: follow up in 1 week Follow up with ID   Follow-up Information     [facility]. Schedule an appointment as soon as possible for a visit in 1 week(s).   Contact information: [address] [place] [address] [phone]         [facility] Follow up.   Why: A representative from [facility] will contact you to arrange start date for your RN anhd PT Contact information: [place] [address] [phone]         [facility] for Infectious Disease. Schedule an appointment as soon as possible for a visit in 1 month(s).   Specialty: Infectious Diseases Why: with CBC and BMP Contact information: [place], [place] [id number] [place] [phone]               Discharge Instructions     Diet - low sodium heart healthy   Complete by: As directed    Increase activity slowly   Complete by: As directed    Leave dressing on - Keep it clean, dry, and intact until clinic visit   Complete by: As directed        Diet recommendation: Cardiac diet  Activity: The patient is advised to gradually reintroduce usual activities, as tolerated  Discharge Condition: stable  Code Status: Full code   History of present illness: As per the H and P dictated on admission, "[name] is a 70 y.o. male with medical history  significant for diabetes mellitus, ESRD, hypertension, diastolic CHF, CABG. Patient presented to the ED with complaints of abdominal pain and vomiting.  Patient reports he has been having abdominal pain and chills for about a month.  Abdominal pain is mostly mid abdomen.  He reports onset of vomiting today, 3 times after he completed his session of dialysis today.  No change in bowel habits.  No cough no difficulty breathing.   Recent hospitalization 6/5- 6/6 for ataxia, CT and MRI negative for acute intercranial abnormality and ischemia, 2 suspected that patient had vestibular abnormality.  Patient tells me he is still unstable on his feet.   ED Course: Temperature 98.5 heart rate 91.  Respiratory 18, blood pressure 138/71.  O2 sat 95% on room air. WBC 16.7.  Lipase 148.  Abdominal CT with contrast showed numerous irregular low-density lesions in the liver largest in the posterior right hepatic lobe of 7.4cm.  Differentials necrotic metastases or abscesses. IV cefepime and metronidazole started.  EDP talked with Dr. Laural Golden GI on-call, recommended patient will need IR drainage.  Hospitalist to admit."  Hospital Course:  Summary of his active problems in the hospital is as following. 1.  Liver lesions suspected abscess SP IR ultrasound guided drain placement. Follow-up on cultures and cytology. Initially on IV vancomycin and Zosyn.  Transition to IV Zosyn only. Now on IV Unasyn.  Cultures growing Yersinia.  We will transition  to oral Augmentin.  Continuing to drain is in place. Outpatient follow-up with radiology for drain management and further imaging. Outpatient follow-up with ID  regarding antibiotic duration.   2.  Hypotensive episode resolved.  With midodrine Blood pressure dropped significantly on 7/1. Hemoglobin dropped 2 g as well.  Now stable. CT abdomen was performed due to recent procedure.  No acute bleeding. Blood pressure improved with midodrine. Now the blood pressure is  likely rather running high therefore midodrine has been discontinued.   3.  ESRD on HD MWF Nephrology consulted. Appreciate assistance.   4.  Constipation. Bowel regimen.   5.  CAD SP CABG HTN Aspirin on hold. Was on Coreg.  Now on hold. Lipitor on hold as well.   6.  Chronic ataxia CT scan, MRI negative for any acute stroke TSH normal. B12 normal. As needed meclizine   7.  Anemia of chronic kidney disease. 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 Hemoglobin dropped significantly. As of right now hemoglobin remained stable.   8.  Pleuritic chest pain. Pleural effusion Chest x-ray and x-ray abdomen does not show any evidence of abnormality. CT abdomen yesterday does not show any evidence of PE. Lower extremity Doppler negative for DVT.  Do not think the patient actually has PE. Pleuritic chest pain likely from liver lesion as well as sympathetic right-sided pleural effusion   9.  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 Resume home meds   10.  Accelerated hypertension. Resuming home regimen and monitor.  Body mass index is 22.61 kg/m.    Nutrition Interventions:        Patient was ambulatory without any assistance. On the day of the discharge the patient's vitals were stable, and no other new acute medical condition were reported. The patient was felt safe to be discharge at Home with Home health.  Consultants: Nephrology, IR  Procedures: drain placement, HD  DISCHARGE MEDICATION: Allergies as of 12/17/2020       Reactions   Shellfish Allergy Hives        Medication List     STOP taking these medications    polyethylene glycol-electrolytes 420 g solution Commonly known as: TriLyte   zolpidem 10 MG tablet Commonly known as: AMBIEN       TAKE these medications    acetaminophen 500 MG tablet Commonly known as: TYLENOL Take 500 mg by mouth every 6 (six) hours  as needed for mild pain or moderate pain.   albuterol 108 (90 Base) MCG/ACT inhaler Commonly known as: VENTOLIN HFA Inhale 2 puffs into the lungs every 6 (six) hours as needed.   amLODipine 10 MG tablet Commonly known as: NORVASC Take 1 tablet (10 mg total) by mouth daily.   amoxicillin-clavulanate 500-125 MG tablet Commonly known as: AUGMENTIN Take 1 tablet (500 mg total) by mouth daily.   carvedilol 25 MG tablet Commonly known as: COREG Take 1 tablet (25 mg total) by mouth 2 (two) times daily.   cloNIDine 0.2 MG tablet Commonly known as: CATAPRES Take 1 tablet (0.2 mg total) by mouth 2 (two) times daily. What changed: Another medication with the same name was removed. Continue taking this medication, and follow the directions you see here.   DAILY VITAMIN PO Take 1 tablet by mouth daily as needed (supplement).   diphenhydramine-acetaminophen 25-500 MG Tabs tablet Commonly known as: TYLENOL PM Take 1 tablet by mouth at bedtime as needed (sleep).   Fish Oil 1000 MG Caps  Take 1,000 mg by mouth daily.   insulin glargine 100 UNIT/ML injection Commonly known as: LANTUS Inject 10-20 Units into the skin at bedtime.   latanoprost 0.005 % ophthalmic solution Commonly known as: XALATAN Place 2 drops into both eyes 2 (two) times daily.   magnesium hydroxide 400 MG/5ML suspension Commonly known as: MILK OF MAGNESIA Take 15 mLs by mouth daily as needed for mild constipation.   meclizine 25 MG tablet Commonly known as: ANTIVERT Take 1 tablet (25 mg total) by mouth 3 (three) times daily as needed for dizziness.   minoxidil 10 MG tablet Commonly known as: LONITEN Take 0.5 tablets (5 mg total) by mouth at bedtime. Patient does not take as prescribed. Sometimes takes a 1/2 tablet depending on BP readings What changed:  how much to take additional instructions Another medication with the same name was removed. Continue taking this medication, and follow the directions you see  here.   Nitrostat 0.4 MG SL tablet Generic drug: nitroGLYCERIN Place 0.4 mg under the tongue every 5 (five) minutes as needed for chest pain.   OVER THE COUNTER MEDICATION Take 1 tablet by mouth daily. Beet Root   sildenafil 50 MG tablet Commonly known as: VIAGRA Take 50 mg by mouth as needed for erectile dysfunction.       ASK your doctor about these medications    aspirin EC 325 MG tablet Take 325mg s once daily on non dialysis days. Dialysis days are Mon, Wed, Fri   atorvastatin 80 MG tablet Commonly known as: LIPITOR Take 1 tablet (80 mg total) by mouth daily at 6 PM.   sevelamer carbonate 800 MG tablet Commonly known as: RENVELA 1 tablet with meals               Discharge Care Instructions  (From admission, onward)           Start     Ordered   12/17/20 0000  Leave dressing on - Keep it clean, dry, and intact until clinic visit        12/17/20 1359            Discharge Exam: Filed Weights   12/17/20 0400 12/17/20 0855 12/17/20 1232  Weight: 87.6 kg 88.5 kg 86.5 kg   Vitals:   12/17/20 1242 12/17/20 1312  BP: (!) 167/66 (!) 159/63  Pulse: 95 83  Resp: (!) 22 20  Temp:  98.2 F (36.8 C)  SpO2:  97%   General: Appear in mild distress, no Rash; Oral Mucosa Clear, moist. no Abnormal Neck Mass Or lumps, Conjunctiva normal  Cardiovascular: S1 and S2 Present, no Murmur, Respiratory: good respiratory effort, Bilateral Air entry present and CTA, no Crackles, no wheezes Abdomen: Bowel Sound present, Soft and no tenderness Extremities: no Pedal edema Neurology: alert and oriented to time, place, and person affect appropriate. no new focal deficit Gait not checked due to patient safety concerns   The results of significant diagnostics from this hospitalization (including imaging, microbiology, ancillary and laboratory) are listed below for reference.    Significant Diagnostic Studies: CT ABDOMEN PELVIS W CONTRAST  Result Date:  12/12/2020 CLINICAL DATA:  Liver lesions.  Status post drain placement. EXAM: CT ABDOMEN AND PELVIS WITH CONTRAST TECHNIQUE: Multidetector CT imaging of the abdomen and pelvis was performed using the standard protocol following bolus administration of intravenous contrast. CONTRAST:  132mL OMNIPAQUE IOHEXOL 300 MG/ML  SOLN COMPARISON:  December 10, 2020. FINDINGS: Lower chest: Bilateral pleural effusions are noted with adjacent subsegmental atelectasis, right  greater than left. Hepatobiliary: Status post interval placement of percutaneous drainage catheter is seen involving the abscesses in the left hepatic lobe and posterior segment of right hepatic lobe. Left hepatic abscess measures 4.6 x 3.9 cm currently which is decreased compared to prior exam. Right posterior abscess measures 7.0 x 5.1 cm which is decreased compared to prior exam. Multiple smaller other low densities or abscess are noted which are not significantly changed. No gallstones or biliary dilatation is noted. Pancreas: Unremarkable. No pancreatic ductal dilatation or surrounding inflammatory changes. Spleen: Normal in size without focal abnormality. Adrenals/Urinary Tract: Adrenal glands appear normal. Bilateral renal atrophy is noted with multiple renal cysts, consistent with history of end-stage renal disease. No hydronephrosis or renal obstruction is noted. Urinary bladder is unremarkable. Stomach/Bowel: Stomach is within normal limits. Appendix appears normal. No evidence of bowel wall thickening, distention, or inflammatory changes. Vascular/Lymphatic: Aortic atherosclerosis. No enlarged abdominal or pelvic lymph nodes. Reproductive: Stable mild prostatic enlargement. Other: No abdominal wall hernia or abnormality. No abdominopelvic ascites. Musculoskeletal: No acute or significant osseous findings. IMPRESSION: Interval placement percutaneous drainage catheters into abscesses in the left hepatic lobe in posterior segment of right hepatic lobe. These  are significantly smaller compared to prior exam status post drainage. Bilateral pleural effusions are noted with adjacent subsegmental atelectasis, right greater than left. Stable mild prostatic enlargement. Bilateral renal atrophy is noted consistent with history of end-stage renal disease. Aortic Atherosclerosis (ICD10-I70.0). Electronically Signed   By: Marijo Conception M.D.   On: 12/12/2020 15:38   CT ABDOMEN PELVIS W CONTRAST  Result Date: 12/10/2020 CLINICAL DATA:  Abdominal pain, fever, vomiting EXAM: CT ABDOMEN AND PELVIS WITH CONTRAST TECHNIQUE: Multidetector CT imaging of the abdomen and pelvis was performed using the standard protocol following bolus administration of intravenous contrast. CONTRAST:  130mL OMNIPAQUE IOHEXOL 300 MG/ML  SOLN COMPARISON:  None. FINDINGS: Lower chest: Small right pleural effusion. Atelectasis in the lower lobes. Diffuse coronary artery calcifications. Scattered aortic calcifications. Hepatobiliary: Numerous irregular low-density masses throughout the liver. Largest is in the posterior right hepatic lobe measuring up to 7.4 cm. Largest area in the left hepatic lobe measures up to 5 cm. Other smaller low-density irregular lesions throughout the liver. These could reflect necrotic masses or abscesses. Gallbladder unremarkable. No biliary ductal dilatation. Pancreas: No focal abnormality or ductal dilatation. Spleen: No focal abnormality.  Normal size. Adrenals/Urinary Tract: Numerous bilateral renal cysts. Kidneys are atrophic. No hydronephrosis. Adrenal glands and urinary bladder unremarkable. Stomach/Bowel: Normal appendix. Stomach, large and small bowel grossly unremarkable. Vascular/Lymphatic: Heavily calcified aorta, iliac vessels and branch vessels. No evidence of aneurysm or adenopathy. Reproductive: Prostate enlargement. Other: No free fluid or free air. Musculoskeletal: No acute bony abnormality. IMPRESSION: Numerous irregular low-density lesions throughout the  liver, the largest in the posterior right hepatic lobe measuring 7.4 cm. These could reflect necrotic metastases or abscesses. Given the patient's history and appearance, abscesses are favored. Aortoiliac atherosclerosis. Numerous low-density lesions throughout the kidneys, likely cysts. Prostate enlargement. These results were called by telephone at the time of interpretation on 12/10/2020 at 7:48 pm to provider Dr. Roderic Palau, who verbally acknowledged these results. Electronically Signed   By: Rolm Baptise M.D.   On: 12/10/2020 19:50   DG Chest Port 1 View  Result Date: 12/04/2020 CLINICAL DATA:  [place] EXAM: PORTABLE CHEST 1 VIEW COMPARISON:  07/28/2018 FINDINGS: Cardiomegaly status post median sternotomy and CABG. Bandlike atelectasis or scarring in the bilateral lung bases. The visualized skeletal structures are unremarkable. IMPRESSION: 1.  Cardiomegaly.  2.  Bandlike atelectasis or scarring in the bilateral lung bases. Electronically Signed   By: Eddie Candle M.D.   On: 12/04/2020 13:29   DG ABD ACUTE 2+V W 1V CHEST  Result Date: 12/14/2020 CLINICAL DATA:  Abdominal pain.  Pleuritic chest pain. EXAM: DG ABDOMEN ACUTE WITH 1 VIEW CHEST COMPARISON:  Abdominopelvic CT 2 days ago 12/12/2020. Most recent chest radiograph 12/04/2020 FINDINGS: Right pleural effusion and basilar atelectasis, grossly unchanged from recent CT allowing for differences in technique. Small left pleural effusion and basilar atelectasis. Patient is post median sternotomy. Unchanged cardiomegaly. No pneumothorax. Right upper quadrant drain not significantly changed in appearance from recent CT, located within within the liver. Midline catheter also unchanged in position, located in the liver on CT. There is no free intra-abdominal air. Enteric contrast within stool throughout the colon, no obstruction. Average volume of colonic stool. Stable osseous structures. IMPRESSION: 1. Right greater than left pleural effusion and bibasilar  atelectasis, grossly stable from recent imaging. 2. Two drainage catheters in the upper abdomen, located within hepatic collections on recent CT. Normal bowel gas pattern. Electronically Signed   By: Keith Rake M.D.   On: 12/14/2020 16:59   CT IMAGE GUIDED DRAINAGE BY PERCUTANEOUS CATHETER  Result Date: 12/11/2020 INDICATION: Fever and chills with abdominal CT demonstrating findings worrisome for hepatic abscesses. Please perform image guided biopsy/aspiration and/or drainage catheter placement for diagnostic and therapeutic purposes. EXAM: ULTRASOUND AND CT-GUIDED HEPATIC ABSCESS DRAINAGE CATHETER PLACEMENT X2 COMPARISON:  CT abdomen and pelvis - 12/10/2020 MEDICATIONS: The patient is currently admitted to the hospital and receiving intravenous antibiotics. The antibiotics were administered within an appropriate time frame prior to the initiation of the procedure. ANESTHESIA/SEDATION: Moderate (conscious) sedation was employed during this procedure. A total of Versed 2 mg and Fentanyl 75 mcg was administered intravenously. Moderate Sedation Time: 38 minutes. The patient's level of consciousness and vital signs were monitored continuously by radiology nursing throughout the procedure under my direct supervision. CONTRAST:  None COMPLICATIONS: None immediate. PROCEDURE: Informed written consent was obtained from the patient after a discussion of the risks, benefits and alternatives to treatment. The patient was placed supine on the CT gantry and a pre procedural CT was performed re-demonstrating the known indeterminate hypoattenuating masses/collections with dominant collection within the posterior segment of the right lobe of the liver measuring at least 7.9 x 5.8 cm (image 33, series 2 and additional collection within the posterior aspect the left lobe of the liver measuring approximately 4.8 x 3.5 cm (image 41, series 2). Sonographic evaluation was performed of the anterior segment of the right lobe of  the liver however failed to definitively replicate the two small hypoattenuating lesions/collections demonstrated on preceding abdominal CT. The table position was marked and the collections were identified sonographically. The procedures were planned. A timeout was performed prior to the initiation of the procedure. The operative sites were prepped and draped in usual sterile fashion. Under direct ultrasound guidance, the dominant collection within the posterior aspect the right lobe of the liver was accessed with a 17 gauge trocar needle. Multiple ultrasound images were saved for procedural documentation purposes. Next, a small amount of purulent fluid was aspirated from the coaxial needle confirming and hepatic abscess. As such, a short Amplatz wire was coiled within the collection. The identical procedure was repeated for the additional ill-defined lesion within the left lobe of the liver also yielding a return of a small amount of purulent fluid. Appropriate positioning was confirmed with CT imaging. Next,  the tracks were serially dilated allowing placement of 12 French percutaneous drainage catheters. Postprocedural imaging demonstrates appropriate positioning of the drainage catheter within the posterior aspect the right lobe of the liver with malpositioning of the left percutaneous drainage catheter. As such, the collection within the left lobe of the liver was re-targeted with ultrasound ultimately allowing successful placement of a 12 French percutaneous drainage catheter. Appropriate position was confirmed with CT imaging (series 5). Approximately 80 cc of purulent material was aspirated from the drainage catheter within the right lobe of the liver while approximately 25 cc of purulent fluid was aspirated from the left-sided percutaneous drainage catheter. A representative sample of aspirated fluid was capped and sent to the laboratory for analysis. Both drainage catheters were flushed with saline and  connected to JP bulbs. Both drainage catheters were secured to the skin entrance site within interrupted sutures and StatLock devices. Dressings were applied. The patient tolerated both procedures well without immediate postprocedural complication. IMPRESSION: 1. Successful ultrasound and CT-guided placement of 12 French percutaneous drainage catheter into dominant abscess within the posterior segment of the right lobe of the liver yielding 80 cc of purulent material. 2. Successful ultrasound and CT-guided placement of a 12 French percutaneous drainage catheter into dominant abscess within the left lobe of the liver yielding 25 cc of purulent fluid. 3. A representative sample of aspirated purulent fluid was capped and sent to the laboratory for analysis. 4. Additional ill-defined collections within the anterior segment of the right lobe of the liver were not identified sonographically and thus not targeted. Electronically Signed   By: Sandi Mariscal M.D.   On: 12/11/2020 16:33   CT IMAGE GUIDED DRAINAGE BY PERCUTANEOUS CATHETER  Result Date: 12/11/2020 INDICATION: Fever and chills with abdominal CT demonstrating findings worrisome for hepatic abscesses. Please perform image guided biopsy/aspiration and/or drainage catheter placement for diagnostic and therapeutic purposes. EXAM: ULTRASOUND AND CT-GUIDED HEPATIC ABSCESS DRAINAGE CATHETER PLACEMENT X2 COMPARISON:  CT abdomen and pelvis - 12/10/2020 MEDICATIONS: The patient is currently admitted to the hospital and receiving intravenous antibiotics. The antibiotics were administered within an appropriate time frame prior to the initiation of the procedure. ANESTHESIA/SEDATION: Moderate (conscious) sedation was employed during this procedure. A total of Versed 2 mg and Fentanyl 75 mcg was administered intravenously. Moderate Sedation Time: 38 minutes. The patient's level of consciousness and vital signs were monitored continuously by radiology nursing throughout the  procedure under my direct supervision. CONTRAST:  None COMPLICATIONS: None immediate. PROCEDURE: Informed written consent was obtained from the patient after a discussion of the risks, benefits and alternatives to treatment. The patient was placed supine on the CT gantry and a pre procedural CT was performed re-demonstrating the known indeterminate hypoattenuating masses/collections with dominant collection within the posterior segment of the right lobe of the liver measuring at least 7.9 x 5.8 cm (image 33, series 2 and additional collection within the posterior aspect the left lobe of the liver measuring approximately 4.8 x 3.5 cm (image 41, series 2). Sonographic evaluation was performed of the anterior segment of the right lobe of the liver however failed to definitively replicate the two small hypoattenuating lesions/collections demonstrated on preceding abdominal CT. The table position was marked and the collections were identified sonographically. The procedures were planned. A timeout was performed prior to the initiation of the procedure. The operative sites were prepped and draped in usual sterile fashion. Under direct ultrasound guidance, the dominant collection within the posterior aspect the right lobe of the liver was accessed  with a 17 gauge trocar needle. Multiple ultrasound images were saved for procedural documentation purposes. Next, a small amount of purulent fluid was aspirated from the coaxial needle confirming and hepatic abscess. As such, a short Amplatz wire was coiled within the collection. The identical procedure was repeated for the additional ill-defined lesion within the left lobe of the liver also yielding a return of a small amount of purulent fluid. Appropriate positioning was confirmed with CT imaging. Next, the tracks were serially dilated allowing placement of 12 French percutaneous drainage catheters. Postprocedural imaging demonstrates appropriate positioning of the drainage  catheter within the posterior aspect the right lobe of the liver with malpositioning of the left percutaneous drainage catheter. As such, the collection within the left lobe of the liver was re-targeted with ultrasound ultimately allowing successful placement of a 12 French percutaneous drainage catheter. Appropriate position was confirmed with CT imaging (series 5). Approximately 80 cc of purulent material was aspirated from the drainage catheter within the right lobe of the liver while approximately 25 cc of purulent fluid was aspirated from the left-sided percutaneous drainage catheter. A representative sample of aspirated fluid was capped and sent to the laboratory for analysis. Both drainage catheters were flushed with saline and connected to JP bulbs. Both drainage catheters were secured to the skin entrance site within interrupted sutures and StatLock devices. Dressings were applied. The patient tolerated both procedures well without immediate postprocedural complication. IMPRESSION: 1. Successful ultrasound and CT-guided placement of 12 French percutaneous drainage catheter into dominant abscess within the posterior segment of the right lobe of the liver yielding 80 cc of purulent material. 2. Successful ultrasound and CT-guided placement of a 12 French percutaneous drainage catheter into dominant abscess within the left lobe of the liver yielding 25 cc of purulent fluid. 3. A representative sample of aspirated purulent fluid was capped and sent to the laboratory for analysis. 4. Additional ill-defined collections within the anterior segment of the right lobe of the liver were not identified sonographically and thus not targeted. Electronically Signed   By: Sandi Mariscal M.D.   On: 12/11/2020 16:33   VAS [place] LOWER EXTREMITY VENOUS (DVT)  Result Date: 12/16/2020  Lower Venous DVT Study Patient Name:  [name]  Date of Exam:   12/15/2020 Medical Rec #: [id number]        Accession #:    [id number] Date of  Birth: 17-Nov-1950        Patient Gender: M Patient Age:   70Y Exam Location:  [facility] Procedure:      VAS [place] LOWER EXTREMITY VENOUS (DVT) Referring Phys: [id number] [facility] M Jeison Delpilar --------------------------------------------------------------------------------  Indications: Concern for malignancy and resultant DVT, prophylaxis temporarily paused due to low hemoglobin.  Limitations: Acoustic shadowing secondary to heavy arterial calcification. Comparison Study: No prior studies. Performing Technologist: Darlin Coco RDMS,RVT  Examination Guidelines: A complete evaluation includes B-mode imaging, spectral Doppler, color Doppler, and power Doppler as needed of all accessible portions of each vessel. Bilateral testing is considered an integral part of a complete examination. Limited examinations for reoccurring indications may be performed as noted. The reflux portion of the exam is performed with the patient in reverse Trendelenburg.  +---------+---------------+---------+-----------+----------+--------------+ RIGHT    CompressibilityPhasicitySpontaneityPropertiesThrombus Aging +---------+---------------+---------+-----------+----------+--------------+ CFV      Full           Yes      Yes                                 +---------+---------------+---------+-----------+----------+--------------+  SFJ      Full                                                        +---------+---------------+---------+-----------+----------+--------------+ FV Prox  Full                                                        +---------+---------------+---------+-----------+----------+--------------+ FV Mid   Full                                                        +---------+---------------+---------+-----------+----------+--------------+ FV DistalFull                                                        +---------+---------------+---------+-----------+----------+--------------+ PFV       Full                                                        +---------+---------------+---------+-----------+----------+--------------+ POP      Full           Yes      Yes                                 +---------+---------------+---------+-----------+----------+--------------+ PTV      Full                                                        +---------+---------------+---------+-----------+----------+--------------+ PERO     Full                                                        +---------+---------------+---------+-----------+----------+--------------+   +---------+---------------+---------+-----------+----------+--------------+ LEFT     CompressibilityPhasicitySpontaneityPropertiesThrombus Aging +---------+---------------+---------+-----------+----------+--------------+ CFV      Full           Yes      Yes                                 +---------+---------------+---------+-----------+----------+--------------+ SFJ      Full                                                        +---------+---------------+---------+-----------+----------+--------------+  FV Prox  Full                                                        +---------+---------------+---------+-----------+----------+--------------+ FV Mid   Full                                                        +---------+---------------+---------+-----------+----------+--------------+ FV DistalFull                                                        +---------+---------------+---------+-----------+----------+--------------+ PFV      Full                                                        +---------+---------------+---------+-----------+----------+--------------+ POP      Full           Yes      Yes                                 +---------+---------------+---------+-----------+----------+--------------+ PTV      Full                                                         +---------+---------------+---------+-----------+----------+--------------+ PERO     Full                                                        +---------+---------------+---------+-----------+----------+--------------+     Summary: RIGHT: - There is no evidence of deep vein thrombosis in the lower extremity.  - No cystic structure found in the popliteal fossa.  - Incidental: 50-74% stenosis of distal SFA, ATA appears occluded, PTA/Pero A patent.  LEFT: - There is no evidence of deep vein thrombosis in the lower extremity.  - A cystic structure is found in the popliteal fossa.  - Incidental: 50-74% stenosis of SFA, ATA appears occluded, PTA patent, monophasic.  *See table(s) above for measurements and observations. Electronically signed by Ruta Hinds MD on 12/16/2020 at 3:49:48 PM.    Final     Microbiology: Recent Results (from the past 240 hour(s))  Blood culture (routine x 2)     Status: None   Collection Time: 12/10/20  8:45 PM   Specimen: BLOOD RIGHT ARM  Result Value Ref Range Status   Specimen Description BLOOD RIGHT ARM  Final   Special Requests   Final    BOTTLES DRAWN AEROBIC AND ANAEROBIC Blood Culture  results may not be optimal due to an inadequate volume of blood received in culture bottles   Culture   Final    NO GROWTH 7 DAYS Performed at [facility], [address], [place], [place] [address]    Report Status 12/17/2020 FINAL  Final  SARS CORONAVIRUS 2 (TAT 6-24 HRS) Nasopharyngeal Nasopharyngeal Swab     Status: None   Collection Time: 12/10/20  9:02 PM   Specimen: Nasopharyngeal Swab  Result Value Ref Range Status   SARS Coronavirus 2 NEGATIVE NEGATIVE Final    Comment: (NOTE) SARS-CoV-2 target nucleic acids are NOT DETECTED.  The SARS-CoV-2 RNA is generally detectable in upper and lower respiratory specimens during the acute phase of infection. Negative results do not preclude SARS-CoV-2 infection, do not rule out co-infections with other  pathogens, and should not be used as the sole basis for treatment or other patient management decisions. Negative results must be combined with clinical observations, patient history, and epidemiological information. The expected result is Negative.  Fact Sheet for Patients: SugarRoll.be  Fact Sheet for Healthcare Providers: https://www.woods-mathews.com/  This test is not yet approved or cleared by the [place] FDA and  has been authorized for detection and/or diagnosis of SARS-CoV-2 by FDA under an Emergency Use Authorization (EUA). This EUA will remain  in effect (meaning this test can be used) for the duration of the COVID-19 declaration under Se ction 564(b)(1) of the Act, 21 U.S.C. section 360bbb-3(b)(1), unless the authorization is terminated or revoked sooner.  Performed at [facility], [place] [address] [place] [address], [place], [place] [address]   Blood culture (routine x 2)     Status: None   Collection Time: 12/10/20 10:03 PM   Specimen: BLOOD RIGHT HAND  Result Value Ref Range Status   Specimen Description BLOOD RIGHT HAND  Final   Special Requests   Final    BOTTLES DRAWN AEROBIC AND ANAEROBIC Blood Culture results may not be optimal due to an inadequate volume of blood received in culture bottles   Culture   Final    NO GROWTH 7 DAYS Performed at [facility], [address], [place], [place] [address]    Report Status 12/17/2020 FINAL  Final  Blood culture (routine x 2)     Status: None   Collection Time: 12/11/20  9:09 AM   Specimen: BLOOD  Result Value Ref Range Status   Specimen Description BLOOD RIGHT HAND  Final   Special Requests   Final    BOTTLES DRAWN AEROBIC AND ANAEROBIC Blood Culture adequate volume   Culture   Final    NO GROWTH 6 DAYS Performed at [facility], [address], [place], [place] [address]    Report Status 12/17/2020 FINAL  Final  Blood culture (routine x 2)     Status: None   Collection Time:  12/11/20  9:09 AM   Specimen: BLOOD  Result Value Ref Range Status   Specimen Description BLOOD RIGHT WRIST  Final   Special Requests   Final    BOTTLES DRAWN AEROBIC AND ANAEROBIC Blood Culture adequate volume   Culture   Final    NO GROWTH 6 DAYS Performed at [facility], [address], [place], [place] [address]    Report Status 12/17/2020 FINAL  Final  Aerobic/Anaerobic Culture w Gram Stain (surgical/deep wound)     Status: None   Collection Time: 12/11/20  3:34 PM   Specimen: Abscess  Result Value Ref Range Status   Specimen Description ABSCESS  Final   Special Requests DRAIN  Final   Gram Stain   Final    MODERATE WBC PRESENT, PREDOMINANTLY PMN FEW GRAM NEGATIVE RODS    Culture   Final    FEW YERSINIA ENTEROCOLITICA GROUP NO ANAEROBES ISOLATED Performed at [facility], [address], [place], [place] [address]    Report Status 12/16/2020 FINAL  Final   Organism ID, Bacteria YERSINIA ENTEROCOLITICA GROUP  Final      Susceptibility   Yersinia enterocolitica group - MIC*    AMPICILLIN >=32 RESISTANT Resistant     CEFAZOLIN RESISTANT Resistant     CEFEPIME <=0.12 SENSITIVE Sensitive     CEFTAZIDIME <=1 SENSITIVE Sensitive     CEFTRIAXONE <=0.25 SENSITIVE Sensitive     CIPROFLOXACIN <=0.25 SENSITIVE Sensitive     GENTAMICIN <=1 SENSITIVE Sensitive     IMIPENEM <=0.25 SENSITIVE Sensitive     TRIMETH/SULFA <=20 SENSITIVE Sensitive     AMPICILLIN/SULBACTAM 4 SENSITIVE Sensitive     PIP/TAZO <=4 SENSITIVE Sensitive     * FEW YERSINIA ENTEROCOLITICA GROUP  MRSA Next Gen by PCR, Nasal     Status: None   Collection Time: 12/15/20  5:10 AM   Specimen: Nasal Mucosa; Nasal Swab  Result Value Ref Range Status   MRSA by PCR Next Gen NOT DETECTED NOT DETECTED Final    Comment: (NOTE) The GeneXpert MRSA Assay (FDA approved for NASAL specimens only), is one component of a comprehensive MRSA colonization surveillance program. It is not intended to diagnose MRSA infection  nor to guide or monitor treatment for MRSA infections. Test performance is not FDA approved in patients less than 47 years old. Performed at [facility], [place] [address], [place], [place] [address]      Labs: CBC: Recent Labs  Lab 12/12/20 0805 12/13/20 0504 12/15/20 0640 12/17/20 1503  WBC 18.3* 14.7* 8.8 7.8  HGB 7.6* 8.2* 8.9* 8.2*  HCT 23.0* 25.8* 27.7* 26.4*  MCV 93.9 95.2 93.9 95.7  PLT 212 269 249 902   Basic Metabolic Panel: Recent Labs  Lab 12/12/20 0805 12/13/20 0504 12/15/20 0640 12/17/20 1323  NA  --  134* 127* 134*  K  --  3.9 3.9 3.9  CL  --  91* 89* 95*  CO2  --  25 24 27   GLUCOSE  --  216* 292* 95  BUN  --  26* 47* 13  CREATININE  --  4.77* 7.57* 3.76*  CALCIUM  --  8.9 8.6* 8.6*  MG  --  2.1 2.3 2.1  PHOS 5.1*  --   --   --    Liver Function Tests: No results for input(s): AST, ALT, ALKPHOS, BILITOT, PROT, ALBUMIN in the last 168 hours. CBG: Recent Labs  Lab 12/16/20 1234 12/16/20 1638 12/16/20 2056 12/17/20 0820 12/17/20 1308  GLUCAP 139* 89 204* 163* 92    Time spent: 35 minutes  Signed:  Berle Mull  [facility] 12/17/2020

## 2020-12-30 ENCOUNTER — Other Ambulatory Visit: Payer: Self-pay | Admitting: Internal Medicine

## 2020-12-30 ENCOUNTER — Ambulatory Visit
Admission: RE | Admit: 2020-12-30 | Discharge: 2020-12-30 | Disposition: A | Discharge status: Home / Self Care | Payer: Medicare Other | Source: Ambulatory Visit | Attending: Radiology | Admitting: Radiology

## 2020-12-30 ENCOUNTER — Encounter: Payer: Self-pay | Admitting: Radiology

## 2020-12-30 ENCOUNTER — Ambulatory Visit
Admission: RE | Admit: 2020-12-30 | Discharge: 2020-12-30 | Disposition: A | Discharge status: Home / Self Care | Payer: Medicare Other | Source: Ambulatory Visit | Attending: Internal Medicine | Admitting: Internal Medicine

## 2020-12-30 ENCOUNTER — Other Ambulatory Visit (HOSPITAL_COMMUNITY): Payer: Self-pay

## 2020-12-30 ENCOUNTER — Other Ambulatory Visit (HOSPITAL_COMMUNITY): Payer: Self-pay | Admitting: Radiology

## 2020-12-30 ENCOUNTER — Other Ambulatory Visit: Payer: Self-pay | Admitting: Interventional Radiology

## 2020-12-30 DIAGNOSIS — K750 Abscess of liver: Secondary | ICD-10-CM

## 2020-12-30 HISTORY — PX: IR RADIOLOGIST EVAL & MGMT: IMG5224

## 2020-12-30 MED ORDER — IOPAMIDOL (ISOVUE-300) INJECTION 61%
100.0000 mL | Freq: Once | INTRAVENOUS | Status: AC | PRN
  Administered 2020-12-30: 100 mL via INTRAVENOUS

## 2020-12-30 MED ORDER — NORMAL SALINE FLUSH 0.9 % IV SOLN
INTRAVENOUS | 0 refills | Status: DC
  Filled 2020-12-30: qty 300, 30d supply, fill #0

## 2020-12-30 NOTE — Progress Notes (Signed)
Referring Physician(s): [name],[name]  Chief Complaint: The patient is seen in follow up today s/p placement of 2 hepatic abscess drains on 12/11/20  History of present illness:  [name] was admitted on 12/10/2020 with abdominal pain, fever and vomiting.  Subsequent CT examination demonstrated numerous hepatic fluid collections.  On 12/11/2020, drains were placed in the 2 largest fluid collections, 1 located in the left and the other located in the right hepatic lobes.   He returns to IR clinic today for evaluation of the drains.  He and his wife report approximately 15 mL net output from the left hepatic drain and 5 mL net output from the right hepatic drain.   CT of the abdomen and pelvis performed on 12/30/2020 demonstrates significant interval reduction in size of the abscesses.  There is a continued liquified cavity adjacent to the right hepatic drain.   He denies any fever, chills, or abdominal pain.  Past Medical History:  Diagnosis Date   Arthritis    CAD (coronary artery disease)    STEMI with LAD stent by [facility] in 2005   Dependence on renal dialysis ([place])    Depression    Diabetes mellitus without complication ([place])    History of kidney stones    Hypertension    Kidney infection    MI (mitral incompetence)    Myocardial infarction ([place])    Seasonal allergies     Past Surgical History:  Procedure Laterality Date   AV FISTULA PLACEMENT Left 01/30/2014   Procedure: INSERTION OF ARTERIOVENOUS (AV) GORE-TEX GRAFT LEFT UPPER ARM;  Surgeon: Elam Dutch, MD;  Location: [facility];  Service: Vascular;  Laterality: Left;   BACK SURGERY     CARDIAC CATHETERIZATION N/A 04/01/2015   Procedure: Right/Left Heart Cath and Coronary Angiography;  Surgeon: Leonie Man, MD;  Location: [facility];  Service: Cardiovascular;  Laterality: N/A;   COLONOSCOPY N/A 10/12/2016   Procedure: COLONOSCOPY;  Surgeon: Daneil Dolin, MD;  [facility];  Service:  Endoscopy;  Laterality: N/A;  2:00pm   COLONOSCOPY W/ POLYPECTOMY     CORONARY ANGIOPLASTY WITH STENT PLACEMENT  2008   CORONARY ARTERY BYPASS GRAFT N/A 04/03/2015   Procedure: CORONARY ARTERY BYPASS GRAFTING (CABG) x  4 (LIMA to LAD, SVG to DIAGONAL, SVG to OM1, and SVG to RCA) with EVH from right greater saphenous thigh and partial lower leg vein ;  Surgeon: Ivin Poot, MD;  Location: [facility];  Service: Open Heart Surgery;  Laterality: N/A;   EYE SURGERY Right    lens implant   INSERTION OF DIALYSIS CATHETER Right 01/30/2014   Procedure: INSERTION OF DIALYSIS CATHETER-RIGHT INTERNAL JUGULAR;  Surgeon: Elam Dutch, MD;  Location: [facility];  Service: Vascular;  Laterality: Right;   IR RADIOLOGIST EVAL & MGMT  12/30/2020   POLYPECTOMY  10/12/2016   Procedure: POLYPECTOMY;  Surgeon: Daneil Dolin, MD;  [facility];  Service: Endoscopy;;  colon   SPINE SURGERY     TEE WITHOUT CARDIOVERSION N/A 04/03/2015   Procedure: TRANSESOPHAGEAL ECHOCARDIOGRAM (TEE);  Surgeon: Ivin Poot, MD;  Location: [facility];  Service: Open Heart Surgery;  Laterality: N/A;    Allergies: Shellfish allergy  Medications: Prior to Admission medications   Medication Sig Start Date End Date Taking? Authorizing Provider  acetaminophen (TYLENOL) 500 MG tablet Take 500 mg by mouth every 6 (six) hours as needed for mild pain or moderate pain.    [provider]  albuterol (PROVENTIL HFA;VENTOLIN HFA) 108 (90 Base) MCG/ACT inhaler Inhale 2 puffs into the lungs every 6 (six) hours as needed. 07/29/18   Rancour, Annie Main, MD  amLODipine (NORVASC) 10 MG tablet Take 1 tablet (10 mg total) by mouth daily. 11/17/20   Barton Dubois, MD  amoxicillin-clavulanate (AUGMENTIN) 500-125 MG tablet Take 1 tablet (500 mg total) by mouth daily. 12/18/20   Lavina Hamman, MD  aspirin EC 325 MG tablet Take 325mg s once daily on non dialysis days. Dialysis days are Mon, Wed, Fri Patient not taking: Reported on 12/04/2020     [provider]  atorvastatin (LIPITOR) 80 MG tablet Take 1 tablet (80 mg total) by mouth daily at 6 PM. Patient not taking: No sig reported 04/10/15   Nani Skillern, PA-C  carvedilol (COREG) 25 MG tablet Take 1 tablet (25 mg total) by mouth 2 (two) times daily. 11/17/20   Barton Dubois, MD  cloNIDine (CATAPRES) 0.2 MG tablet Take 1 tablet (0.2 mg total) by mouth 2 (two) times daily. 11/17/20   Barton Dubois, MD  diphenhydramine-acetaminophen (TYLENOL PM) 25-500 MG TABS tablet Take 1 tablet by mouth at bedtime as needed (sleep).    [provider]  insulin glargine (LANTUS) 100 UNIT/ML injection Inject 10-20 Units into the skin at bedtime.    [provider]  latanoprost (XALATAN) 0.005 % ophthalmic solution Place 2 drops into both eyes 2 (two) times daily. 08/27/16   [provider]  magnesium hydroxide (MILK OF MAGNESIA) 400 MG/5ML suspension Take 15 mLs by mouth daily as needed for mild constipation.    [provider]  meclizine (ANTIVERT) 25 MG tablet Take 1 tablet (25 mg total) by mouth 3 (three) times daily as needed for dizziness. 11/17/20   Barton Dubois, MD  minoxidil (LONITEN) 10 MG tablet Take 0.5 tablets (5 mg total) by mouth at bedtime. Patient does not take as prescribed. Sometimes takes a 1/2 tablet depending on BP readings 11/17/20   Barton Dubois, MD  Multiple Vitamin (DAILY VITAMIN PO) Take 1 tablet by mouth daily as needed (supplement).    [provider]  NITROSTAT 0.4 MG SL tablet Place 0.4 mg under the tongue every 5 (five) minutes as needed for chest pain.  01/23/15   [provider]  Omega-3 Fatty Acids (FISH OIL) 1000 MG CAPS Take 1,000 mg by mouth daily.    [provider]  OVER THE COUNTER MEDICATION Take 1 tablet by mouth daily. Beet Root    [provider]  sevelamer carbonate (RENVELA) 800 MG tablet 1 tablet with meals Patient not taking: Reported on 12/10/2020    [provider]   sildenafil (VIAGRA) 50 MG tablet Take 50 mg by mouth as needed for erectile dysfunction.    [provider]  Sodium Chloride Flush (NORMAL SALINE FLUSH) 0.9 % SOLN Use as directed 12/30/20   Emmah Bratcher, Dillon Libra, MD     Family History  Problem Relation Age of Onset   Diabetes Mother    Hypertension Mother    Diabetes Father    Hypertension Father    Diabetes Other     Social History   Socioeconomic History   Marital status: Married    Spouse name: Not on file   Number of children: Not on file   Years of education: Not on file   Highest education level: Not on file  Occupational History   Not on file  Tobacco Use   Smoking status: Never   Smokeless tobacco: Former  Types: Sarina Ser    Quit date: 06/14/2010  Vaping Use   Vaping Use: Never used  Substance and Sexual Activity   Alcohol use: No   Drug use: No   Sexual activity: Not on file  Other Topics Concern   Not on file  Social History Narrative   Not on file   Social Determinants of Health   Financial Resource Strain: Not on file  Food Insecurity: Not on file  Transportation Needs: Not on file  Physical Activity: Not on file  Stress: Not on file  Social Connections: Not on file     Vital Signs: BP (!) 158/70   Pulse 69   Temp 99.7 F (37.6 C)   SpO2 95%   Physical Exam  Imaging: IR Radiologist Eval & Mgmt  Result Date: 12/30/2020 Please refer to notes tab for details about interventional procedure. (Op Note)   Labs:  CBC: Recent Labs    12/12/20 0805 12/13/20 0504 12/15/20 0640 12/17/20 1503  WBC 18.3* 14.7* 8.8 7.8  HGB 7.6* 8.2* 8.9* 8.2*  HCT 23.0* 25.8* 27.7* 26.4*  PLT 212 269 249 237    COAGS: Recent Labs    12/11/20 0330 12/12/20 0805  INR 1.4* 1.5*    BMP: Recent Labs    12/12/20 0607 12/13/20 0504 12/15/20 0640 12/17/20 1323  NA 131* 134* 127* 134*  K 4.2 3.9 3.9 3.9  CL 90* 91* 89* 95*  CO2 23 25 24 27   GLUCOSE 330* 216* 292* 95  BUN 41* 26* 47* 13  CALCIUM  8.2* 8.9 8.6* 8.6*  CREATININE 7.40* 4.77* 7.57* 3.76*  GFRNONAA 7* 12* 7* 17*    LIVER FUNCTION TESTS: Recent Labs    12/04/20 1334 12/10/20 1815 12/11/20 0330 12/12/20 0607  BILITOT 0.7 0.8 0.8 1.5*  AST 102* 79* 43* 134*  ALT 92* 81* 67* 99*  ALKPHOS 87 140* 129* 96  PROT 6.7 7.5 7.0 5.6*  ALBUMIN 2.4* 2.5* 2.4* 1.8*    Assessment and Plan:  70 year old gentleman status post bilateral hepatic abscess drain placement returns to IR clinic for drain evaluation.   CT of the abdomen and pelvis performed today shows significant will decrease in size of abscess cavities.   Given 15 mL daily net output from the left drain, it was left in place until the output is less than 10 mL per day.   A small persistent liquified abscess cavity is seen adjacent to the right hepatic drain.  I performed a fluoroscopic which confirmed this abscess cavity was in communication with the right hepatic drain.  I vigorously flushed the right hepatic drain and reattached to bulb suction.  Plan:  - Continue to flush both drain with 5 mL saline per day. - Return in 1 week to IR clinic for consideration for drain removal.  Electronically Signed: Paula [name] Xoey Warmoth 12/30/2020, 3:58 PM   I spent a total of 15 Minutes in face to face in clinical consultation, greater than 50% of which was counseling/coordinating care for hepatic abscess drains.

## 2020-12-31 ENCOUNTER — Ambulatory Visit: Payer: Medicare Other | Admitting: Internal Medicine

## 2020-12-31 ENCOUNTER — Telehealth: Payer: Self-pay

## 2020-12-31 NOTE — Telephone Encounter (Signed)
I have tried to contact the number on file for the patient several times today . Patient's phone line gives a busy signal each time.  [name] T Brooks Sailors

## 2020-12-31 NOTE — Telephone Encounter (Signed)
-----   Message from Mignon Pine, DO sent at 12/31/2020 10:07 AM EDT ----- Thank you.  I saw the result.  Unfortunately, he did not keep appointment with me today.  I have an opening it looks like on 8/9 for 15 min spot.  Can we try to get him rescheduled there?  Thanks, Mitzi Hansen    ----- Message ----- From: Allegra Grana, CMA Sent: 12/31/2020   9:04 AM EDT To: Mignon Pine, DO  [facility] calling notify you of patient's CT results. Results are in the patient's chart.

## 2021-01-01 ENCOUNTER — Encounter: Payer: Self-pay | Admitting: Internal Medicine

## 2021-01-08 ENCOUNTER — Encounter: Payer: Self-pay | Admitting: Radiology

## 2021-01-08 ENCOUNTER — Ambulatory Visit
Admission: RE | Admit: 2021-01-08 | Discharge: 2021-01-08 | Disposition: A | Discharge status: Home / Self Care | Payer: Medicare Other | Source: Ambulatory Visit | Attending: Internal Medicine | Admitting: Internal Medicine

## 2021-01-08 ENCOUNTER — Ambulatory Visit
Admission: RE | Admit: 2021-01-08 | Discharge: 2021-01-08 | Disposition: A | Discharge status: Home / Self Care | Payer: Medicare Other | Source: Ambulatory Visit | Attending: Interventional Radiology | Admitting: Interventional Radiology

## 2021-01-08 DIAGNOSIS — K750 Abscess of liver: Secondary | ICD-10-CM

## 2021-01-08 HISTORY — PX: IR RADIOLOGIST EVAL & MGMT: IMG5224

## 2021-01-08 NOTE — Progress Notes (Signed)
Referring Physician(s): Wallace,A/Rourk,M  Chief Complaint: The patient is seen in follow up today s/p image guided drainage of right and left hepatic abscesses on 12/11/2020  History of present illness:  Mr. [name] is a 70 year old male with past medical history significant for coronary artery disease with prior stenting, end-stage renal disease, depression, diabetes, nephrolithiasis, hypertension, prior MI who underwent image guided drainage of both right and left hepatic abscesses on 12/11/2020.  Drain fluid cultures grew Yersinia enterocolitica.  Patient was last seen in IR clinic on 12/30/2020 and follow-up imaging at the time revealed significant reduction in size of hepatic abscesses with no significant residual abscess in the left lobe drain site but persistent abscess cavity seen adjacent to pigtail of the right lobe drain.  He underwent right drain abscessogram which demonstrated communication between the pigtail of the drain and residual abscess cavity.  Both drains were left in place to JP drain and patient continued with drain irrigation.  He presents again today for follow-up drain evaluations.  According to the patient and his spouse he has done well since his last visit.  He denies fever, headache, chest pain, worsening dyspnea, cough, worsening abdominal pain, nausea, vomiting or bleeding.  He has some minimal tenderness at drain sites but no evidence of leakage.  He currently flushes the drains every 2 to 3 days with small amount of sterile saline.  He is currently on Augmentin.  There has been minimal output from both drains since the patient's last visit with [place].  Past Medical History:  Diagnosis Date   Arthritis    CAD (coronary artery disease)    STEMI with LAD stent by [facility] in 2005   Dependence on renal dialysis ([place])    Depression    Diabetes mellitus without complication ([place])    History of kidney stones    Hypertension    Kidney infection    MI (mitral incompetence)     Myocardial infarction ([place])    Seasonal allergies     Past Surgical History:  Procedure Laterality Date   AV FISTULA PLACEMENT Left 01/30/2014   Procedure: INSERTION OF ARTERIOVENOUS (AV) GORE-TEX GRAFT LEFT UPPER ARM;  Surgeon: Elam Dutch, MD;  Location: [place];  Service: Vascular;  Laterality: Left;   BACK SURGERY     CARDIAC CATHETERIZATION N/A 04/01/2015   Procedure: Right/Left Heart Cath and Coronary Angiography;  Surgeon: Leonie Man, MD;  Location: [facility];  Service: Cardiovascular;  Laterality: N/A;   COLONOSCOPY N/A 10/12/2016   Procedure: COLONOSCOPY;  Surgeon: Daneil Dolin, MD;  [facility];  Service: Endoscopy;  Laterality: N/A;  2:00pm   COLONOSCOPY W/ POLYPECTOMY     CORONARY ANGIOPLASTY WITH STENT PLACEMENT  2008   CORONARY ARTERY BYPASS GRAFT N/A 04/03/2015   Procedure: CORONARY ARTERY BYPASS GRAFTING (CABG) x  4 (LIMA to LAD, SVG to DIAGONAL, SVG to OM1, and SVG to RCA) with EVH from right greater saphenous thigh and partial lower leg vein ;  Surgeon: Ivin Poot, MD;  Location: [facility];  Service: Open Heart Surgery;  Laterality: N/A;   EYE SURGERY Right    lens implant   INSERTION OF DIALYSIS CATHETER Right 01/30/2014   Procedure: INSERTION OF DIALYSIS CATHETER-RIGHT INTERNAL JUGULAR;  Surgeon: Elam Dutch, MD;  Location: [facility];  Service: Vascular;  Laterality: Right;   IR RADIOLOGIST EVAL & MGMT  12/30/2020   POLYPECTOMY  10/12/2016   Procedure: POLYPECTOMY;  Surgeon: Daneil Dolin, MD;  [facility];  Service: Endoscopy;;  colon   SPINE SURGERY     TEE WITHOUT CARDIOVERSION N/A 04/03/2015   Procedure: TRANSESOPHAGEAL ECHOCARDIOGRAM (TEE);  Surgeon: Ivin Poot, MD;  Location: [facility];  Service: Open Heart Surgery;  Laterality: N/A;    Allergies: Shellfish allergy  Medications: Prior to Admission medications   Medication Sig Start Date End Date Taking? Authorizing Provider  acetaminophen (TYLENOL) 500 MG tablet  Take 500 mg by mouth every 6 (six) hours as needed for mild pain or moderate pain.    [provider]  albuterol (PROVENTIL HFA;VENTOLIN HFA) 108 (90 Base) MCG/ACT inhaler Inhale 2 puffs into the lungs every 6 (six) hours as needed. 07/29/18   Rancour, Annie Main, MD  amLODipine (NORVASC) 10 MG tablet Take 1 tablet (10 mg total) by mouth daily. 11/17/20   Barton Dubois, MD  amoxicillin-clavulanate (AUGMENTIN) 500-125 MG tablet Take 1 tablet (500 mg total) by mouth daily. 12/18/20   Lavina Hamman, MD  aspirin EC 325 MG tablet Take 325mg s once daily on non dialysis days. Dialysis days are Mon, Wed, Fri Patient not taking: Reported on 12/04/2020    [provider]  atorvastatin (LIPITOR) 80 MG tablet Take 1 tablet (80 mg total) by mouth daily at 6 PM. Patient not taking: No sig reported 04/10/15   Nani Skillern, PA-C  carvedilol (COREG) 25 MG tablet Take 1 tablet (25 mg total) by mouth 2 (two) times daily. 11/17/20   Barton Dubois, MD  cloNIDine (CATAPRES) 0.2 MG tablet Take 1 tablet (0.2 mg total) by mouth 2 (two) times daily. 11/17/20   Barton Dubois, MD  diphenhydramine-acetaminophen (TYLENOL PM) 25-500 MG TABS tablet Take 1 tablet by mouth at bedtime as needed (sleep).    [provider]  insulin glargine (LANTUS) 100 UNIT/ML injection Inject 10-20 Units into the skin at bedtime.    [provider]  latanoprost (XALATAN) 0.005 % ophthalmic solution Place 2 drops into both eyes 2 (two) times daily. 08/27/16   [provider]  magnesium hydroxide (MILK OF MAGNESIA) 400 MG/5ML suspension Take 15 mLs by mouth daily as needed for mild constipation.    [provider]  meclizine (ANTIVERT) 25 MG tablet Take 1 tablet (25 mg total) by mouth 3 (three) times daily as needed for dizziness. 11/17/20   Barton Dubois, MD  minoxidil (LONITEN) 10 MG tablet Take 0.5 tablets (5 mg total) by mouth at bedtime. Patient does not take as prescribed. Sometimes takes a 1/2  tablet depending on BP readings 11/17/20   Barton Dubois, MD  Multiple Vitamin (DAILY VITAMIN PO) Take 1 tablet by mouth daily as needed (supplement).    [provider]  NITROSTAT 0.4 MG SL tablet Place 0.4 mg under the tongue every 5 (five) minutes as needed for chest pain.  01/23/15   [provider]  Omega-3 Fatty Acids (FISH OIL) 1000 MG CAPS Take 1,000 mg by mouth daily.    [provider]  OVER THE COUNTER MEDICATION Take 1 tablet by mouth daily. Beet Root    [provider]  sevelamer carbonate (RENVELA) 800 MG tablet 1 tablet with meals Patient not taking: Reported on 12/10/2020    [provider]  sildenafil (VIAGRA) 50 MG tablet Take 50 mg by mouth as needed for erectile dysfunction.    [provider]  Sodium Chloride Flush (NORMAL SALINE FLUSH) 0.9 % SOLN Use as directed 12/30/20   Mir, Paula Libra, MD     Family  History  Problem Relation Age of Onset   Diabetes Mother    Hypertension Mother    Diabetes Father    Hypertension Father    Diabetes Other     Social History   Socioeconomic History   Marital status: Married    Spouse name: Not on file   Number of children: Not on file   Years of education: Not on file   Highest education level: Not on file  Occupational History   Not on file  Tobacco Use   Smoking status: Never   Smokeless tobacco: Former    Types: Chew    Quit date: 06/14/2010  Vaping Use   Vaping Use: Never used  Substance and Sexual Activity   Alcohol use: No   Drug use: No   Sexual activity: Not on file  Other Topics Concern   Not on file  Social History Narrative   Not on file   Social Determinants of Health   Financial Resource Strain: Not on file  Food Insecurity: Not on file  Transportation Needs: Not on file  Physical Activity: Not on file  Stress: Not on file  Social Connections: Not on file     Vital Signs: Vitals:   01/08/21 1300  BP: (!) 159/78  Pulse: 77  Temp: 98.8 F  (37.1 C)  SpO2: 94%      Physical Exam awake, alert.  Both right and left hepatic drains intact, insertion sites okay, mildly tender, small amt crusting/scab formation around insertion sites.  Minimal amount of brown fluid in right hepatic JP bulb and reddish-brown fluid in left hepatic JP bulb  Imaging: No results found.  Labs:  CBC: Recent Labs    12/12/20 0805 12/13/20 0504 12/15/20 0640 12/17/20 1503  WBC 18.3* 14.7* 8.8 7.8  HGB 7.6* 8.2* 8.9* 8.2*  HCT 23.0* 25.8* 27.7* 26.4*  PLT 212 269 249 237    COAGS: Recent Labs    12/11/20 0330 12/12/20 0805  INR 1.4* 1.5*    BMP: Recent Labs    12/12/20 0607 12/13/20 0504 12/15/20 0640 12/17/20 1323  NA 131* 134* 127* 134*  K 4.2 3.9 3.9 3.9  CL 90* 91* 89* 95*  CO2 23 25 24 27   GLUCOSE 330* 216* 292* 95  BUN 41* 26* 47* 13  CALCIUM 8.2* 8.9 8.6* 8.6*  CREATININE 7.40* 4.77* 7.57* 3.76*  GFRNONAA 7* 12* 7* 17*    LIVER FUNCTION TESTS: Recent Labs    12/04/20 1334 12/10/20 1815 12/11/20 0330 12/12/20 0607  BILITOT 0.7 0.8 0.8 1.5*  AST 102* 79* 43* 134*  ALT 92* 81* 67* 99*  ALKPHOS 87 140* 129* 96  PROT 6.7 7.5 7.0 5.6*  ALBUMIN 2.4* 2.5* 2.4* 1.8*    Assessment: 70 year old male with past medical history significant for coronary artery disease with prior stenting, end-stage renal disease, depression, diabetes, nephrolithiasis, hypertension, prior MI who underwent image guided drainage of both right and left hepatic abscesses on 12/11/2020.  Drain fluid cultures grew Yersinia enterocolitica.  Patient was last seen in IR clinic on 12/30/2020 and follow-up imaging at the time revealed significant reduction in size of hepatic abscesses with no significant residual abscess in the left lobe drain site but persistent abscess cavity seen adjacent to pigtail of the right lobe drain.  He underwent right drain abscessogram which demonstrated communication between the pigtail of the drain and residual abscess  cavity.  Both drains were left in place to JP drain and patient continued with drain irrigation.  He presents again today for follow-up drain evaluations.  According to the patient and his spouse he has done well since his last visit.  He denies fever, headache, chest pain, worsening dyspnea, cough, worsening abdominal pain, nausea, vomiting or bleeding.  He has some minimal tenderness at drain sites but no evidence of leakage.  He currently flushes the drains every 2 to 3 days with small amount of sterile saline.  He is currently on Augmentin.  There has been minimal output from both drains since the patient's last visit with [place].  Case was reviewed by Dr. Kathlene Cote.  Based on review of prior imaging and patient's continued minimal output from both drains decision made to remove both right and left hepatic drains today.  Drains were removed in their entireties without immediate complication.  Gauze dressings applied over sites.  Drain care instructions reviewed with patient/spouse.  Patient to follow-up with primary care physician.   Signed: D. Rowe Robert, PA-C 01/08/2021, 2:10 PM   Please refer to Dr. Margaretmary Dys attestation of this note for management and plan.      Patient ID: [name], male   DOB: 28-Nov-1950, 70 y.o.   MRN: [id number]

## 2021-02-01 ENCOUNTER — Other Ambulatory Visit: Payer: Self-pay

## 2021-02-01 ENCOUNTER — Emergency Department (HOSPITAL_COMMUNITY): Payer: Medicare Other

## 2021-02-01 ENCOUNTER — Emergency Department (HOSPITAL_COMMUNITY)
Admission: EM | Admit: 2021-02-01 | Discharge: 2021-02-01 | Disposition: A | Discharge status: Home / Self Care | Payer: Medicare Other | Attending: Student | Admitting: Student

## 2021-02-01 ENCOUNTER — Encounter (HOSPITAL_COMMUNITY): Payer: Self-pay | Admitting: Emergency Medicine

## 2021-02-01 DIAGNOSIS — I5031 Acute diastolic (congestive) heart failure: Secondary | ICD-10-CM | POA: Diagnosis not present

## 2021-02-01 DIAGNOSIS — N186 End stage renal disease: Secondary | ICD-10-CM | POA: Diagnosis present

## 2021-02-01 DIAGNOSIS — Z7982 Long term (current) use of aspirin: Secondary | ICD-10-CM | POA: Insufficient documentation

## 2021-02-01 DIAGNOSIS — Z79899 Other long term (current) drug therapy: Secondary | ICD-10-CM | POA: Insufficient documentation

## 2021-02-01 DIAGNOSIS — Z951 Presence of aortocoronary bypass graft: Secondary | ICD-10-CM | POA: Diagnosis not present

## 2021-02-01 DIAGNOSIS — I132 Hypertensive heart and chronic kidney disease with heart failure and with stage 5 chronic kidney disease, or end stage renal disease: Secondary | ICD-10-CM | POA: Insufficient documentation

## 2021-02-01 DIAGNOSIS — Z992 Dependence on renal dialysis: Secondary | ICD-10-CM | POA: Insufficient documentation

## 2021-02-01 DIAGNOSIS — E1122 Type 2 diabetes mellitus with diabetic chronic kidney disease: Secondary | ICD-10-CM | POA: Diagnosis not present

## 2021-02-01 DIAGNOSIS — E119 Type 2 diabetes mellitus without complications: Secondary | ICD-10-CM | POA: Diagnosis not present

## 2021-02-01 DIAGNOSIS — I2510 Atherosclerotic heart disease of native coronary artery without angina pectoris: Secondary | ICD-10-CM | POA: Insufficient documentation

## 2021-02-01 DIAGNOSIS — Z87891 Personal history of nicotine dependence: Secondary | ICD-10-CM | POA: Diagnosis not present

## 2021-02-01 DIAGNOSIS — I10 Essential (primary) hypertension: Secondary | ICD-10-CM | POA: Diagnosis not present

## 2021-02-01 DIAGNOSIS — Z794 Long term (current) use of insulin: Secondary | ICD-10-CM | POA: Insufficient documentation

## 2021-02-01 LAB — CBC WITH DIFFERENTIAL/PLATELET
Abs Immature Granulocytes: 0.01 10*3/uL (ref 0.00–0.07)
Basophils Absolute: 0.1 10*3/uL (ref 0.0–0.1)
Basophils Relative: 1 %
Eosinophils Absolute: 0.3 10*3/uL (ref 0.0–0.5)
Eosinophils Relative: 5 %
HCT: 33.3 % — ABNORMAL LOW (ref 39.0–52.0)
Hemoglobin: 10.9 g/dL — ABNORMAL LOW (ref 13.0–17.0)
Immature Granulocytes: 0 %
Lymphocytes Relative: 17 %
Lymphs Abs: 1 10*3/uL (ref 0.7–4.0)
MCH: 31.4 pg (ref 26.0–34.0)
MCHC: 32.7 g/dL (ref 30.0–36.0)
MCV: 96 fL (ref 80.0–100.0)
Monocytes Absolute: 0.4 10*3/uL (ref 0.1–1.0)
Monocytes Relative: 7 %
Neutro Abs: 4.4 10*3/uL (ref 1.7–7.7)
Neutrophils Relative %: 70 %
Platelets: 189 10*3/uL (ref 150–400)
RBC: 3.47 MIL/uL — ABNORMAL LOW (ref 4.22–5.81)
RDW: 18.1 % — ABNORMAL HIGH (ref 11.5–15.5)
WBC: 6.2 10*3/uL (ref 4.0–10.5)
nRBC: 0 % (ref 0.0–0.2)

## 2021-02-01 LAB — BASIC METABOLIC PANEL
Anion gap: 11 (ref 5–15)
BUN: 55 mg/dL — ABNORMAL HIGH (ref 8–23)
CO2: 29 mmol/L (ref 22–32)
Calcium: 9.2 mg/dL (ref 8.9–10.3)
Chloride: 88 mmol/L — ABNORMAL LOW (ref 98–111)
Creatinine, Ser: 7.75 mg/dL — ABNORMAL HIGH (ref 0.61–1.24)
GFR, Estimated: 7 mL/min — ABNORMAL LOW (ref >60)
Glucose, Bld: 129 mg/dL — ABNORMAL HIGH (ref 70–99)
Potassium: 4.2 mmol/L (ref 3.5–5.1)
Sodium: 128 mmol/L — ABNORMAL LOW (ref 135–145)

## 2021-02-01 MED ORDER — LABETALOL HCL 5 MG/ML IV SOLN
10.0000 mg | Freq: Once | INTRAVENOUS | Status: AC
  Administered 2021-02-01: 10 mg via INTRAVENOUS
  Filled 2021-02-01: qty 4

## 2021-02-01 MED ORDER — MINOXIDIL 2.5 MG PO TABS
5.0000 mg | ORAL_TABLET | Freq: Every day | ORAL | Status: DC
  Administered 2021-02-01: 5 mg via ORAL
  Filled 2021-02-01 (×2): qty 2

## 2021-02-01 MED ORDER — LABETALOL HCL 5 MG/ML IV SOLN
20.0000 mg | INTRAVENOUS | Status: DC | PRN
  Administered 2021-02-01: 20 mg via INTRAVENOUS
  Filled 2021-02-01: qty 4

## 2021-02-01 MED ORDER — MINOXIDIL 10 MG PO TABS: 5.0000 mg | ORAL_TABLET | Freq: Every day | ORAL | 5 refills | Status: DC

## 2021-02-01 MED ORDER — CLONIDINE HCL 0.2 MG PO TABS: 0.2000 mg | ORAL_TABLET | Freq: Two times a day (BID) | ORAL | 5 refills | Status: DC

## 2021-02-01 MED ORDER — NICARDIPINE HCL IN NACL 20-0.86 MG/200ML-% IV SOLN: 3.0000 mg/h | INTRAVENOUS | Status: DC

## 2021-02-01 MED ORDER — AMLODIPINE BESYLATE 10 MG PO TABS: 10.0000 mg | ORAL_TABLET | Freq: Every day | ORAL | 5 refills | Status: DC

## 2021-02-01 MED ORDER — CARVEDILOL 25 MG PO TABS: 25.0000 mg | ORAL_TABLET | Freq: Two times a day (BID) | ORAL | 2 refills | Status: DC

## 2021-02-01 MED ORDER — HYDRALAZINE HCL 50 MG PO TABS: 50.0000 mg | ORAL_TABLET | Freq: Three times a day (TID) | ORAL | 4 refills | Status: DC

## 2021-02-01 MED ORDER — AMLODIPINE BESYLATE 5 MG PO TABS
10.0000 mg | ORAL_TABLET | Freq: Once | ORAL | Status: AC
  Administered 2021-02-01: 10 mg via ORAL
  Filled 2021-02-01: qty 2

## 2021-02-01 MED ORDER — MINOXIDIL 10 MG PO TABS: 5.0000 mg | ORAL_TABLET | Freq: Every day | ORAL | 3 refills | Status: DC

## 2021-02-01 MED ORDER — CARVEDILOL 25 MG PO TABS: 25.0000 mg | ORAL_TABLET | Freq: Two times a day (BID) | ORAL | 5 refills | Status: DC

## 2021-02-01 MED ORDER — HYDRALAZINE HCL 20 MG/ML IJ SOLN
10.0000 mg | Freq: Once | INTRAMUSCULAR | Status: AC
  Administered 2021-02-01: 10 mg via INTRAVENOUS
  Filled 2021-02-01: qty 1

## 2021-02-01 MED ORDER — HYDRALAZINE HCL 50 MG PO TABS: 50.0000 mg | ORAL_TABLET | Freq: Three times a day (TID) | ORAL | 5 refills | Status: DC

## 2021-02-01 NOTE — Consult Note (Signed)
Patient Demographics:    [name], is a 69 y.o. male  MRN: [id number]   DOB - June 25, 1950  Admit Date - 02/01/2021  Outpatient Primary MD for the patient is [facility]:    Active Problems:   Stage 2 hypertension   Type 2 diabetes mellitus without complications (HCC)   ESRD (end stage renal disease) (HCC)   1)Uncontrolled stage II Hypertension--- asymptomatic apparently due to recent changes to his medications while hospitalized  -At this time patient denies headaches, visual disturbance, no neck pain, no chest pains no palpitations no dizziness, no shortness of breath -Patient states he feels like his usual self, no extremity numbness or weakness -Patient instructed to restart minoxidil and amlodipine (he has been off both of these medications) -Also instructed to increase hydralazine to 50 mg 3 times daily -Please see blood pressure medication adjustments as outlined in discharge instructions below -Blood pressure diary as advised in discharge instructions below -Patient denies tobacco, alcohol or drug use  2)ESRD--- continue modalities on Mondays Wednesdays Fridays via left arm AV fistula - 3)DM2-recent A1c is 8.6 reflecting uncontrolled diabetes with hyperglycemia PTA = Follow-up with PCP for further medication adjustment for better diabetic outcomes  4)CAD--status post prior CABG, asymptomatic, okay to take aspirin, Coreg and  Lipitor  5) chronic anemia of ESRD--- hemoglobin is 10.9, stable, EPO/ESA agent per nephrology team  6) hyponatremia--- to be addressed with hemodialysis sessions  Discharge Instructions:- 1)--Your blood pressure is Not well controlled --- it is running too high please restart minoxidil at 5 mg every bedtime, also restart  amlodipine 10 mg once daily, please increase hydralazine to 50 mg 3 times daily, please continue carvedilol/Coreg 25 mg twice daily, please continue clonidine 0.2 mg twice daily  2)Please recheck your blood pressure at your Hemodialysis center on Mondays Wednesdays and Fridays and keep a diary of your blood pressure reading to take to your Nephrologist and Primary care physician so that they can adjust the blood pressure medications further over the next week or 2  3)Avoid ibuprofen/Advil/Aleve/Motrin/Goody Powders/Naproxen/BC powders/Meloxicam/Diclofenac/Indomethacin and other Nonsteroidal anti-inflammatory medications as these will make you more likely to bleed and can cause stomach ulcers, can also cause Kidney problems.   4)Very low-salt diet advised  5)Weigh yourself daily, call if you gain more than 3 pounds in 1 day or more than 5 pounds in 1 week as your hemodialysis sessions may need to be adjusted  6)Limit your Fluid  intake to no more than 60 ounces (1.8 Liters) per day   Disposition/--discharge home in stable condition  With History of - Reviewed by me  Past Medical History:  Diagnosis Date   Arthritis    CAD (coronary artery disease)    STEMI with LAD stent by [facility] in 2005   Dependence on renal dialysis ([place])    Depression    Diabetes mellitus without complication ([place])  History of kidney stones    Hypertension    Kidney infection    MI (mitral incompetence)    Myocardial infarction (HCC)    Seasonal allergies       Past Surgical History:  Procedure Laterality Date   AV FISTULA PLACEMENT Left 01/30/2014   Procedure: INSERTION OF ARTERIOVENOUS (AV) GORE-TEX GRAFT LEFT UPPER ARM;  Surgeon: Elam Dutch, MD;  Location: [place];  Service: Vascular;  Laterality: Left;   BACK SURGERY     CARDIAC CATHETERIZATION N/A 04/01/2015   Procedure: Right/Left Heart Cath and Coronary Angiography;  Surgeon: Leonie Man, MD;  Location: [place] [facility];  Service:  Cardiovascular;  Laterality: N/A;   COLONOSCOPY N/A 10/12/2016   Procedure: COLONOSCOPY;  Surgeon: Daneil Dolin, MD;  [facility];  Service: Endoscopy;  Laterality: N/A;  2:00pm   COLONOSCOPY W/ POLYPECTOMY     CORONARY ANGIOPLASTY WITH STENT PLACEMENT  2008   CORONARY ARTERY BYPASS GRAFT N/A 04/03/2015   Procedure: CORONARY ARTERY BYPASS GRAFTING (CABG) x  4 (LIMA to LAD, SVG to DIAGONAL, SVG to OM1, and SVG to RCA) with EVH from right greater saphenous thigh and partial lower leg vein ;  Surgeon: Ivin Poot, MD;  Location: [place];  Service: Open Heart Surgery;  Laterality: N/A;   EYE SURGERY Right    lens implant   INSERTION OF DIALYSIS CATHETER Right 01/30/2014   Procedure: INSERTION OF DIALYSIS CATHETER-RIGHT INTERNAL JUGULAR;  Surgeon: Elam Dutch, MD;  Location: [place];  Service: Vascular;  Laterality: Right;   IR RADIOLOGIST EVAL & MGMT  12/30/2020   IR RADIOLOGIST EVAL & MGMT  01/08/2021   POLYPECTOMY  10/12/2016   Procedure: POLYPECTOMY;  Surgeon: Daneil Dolin, MD;  [facility];  Service: Endoscopy;;  colon   SPINE SURGERY     TEE WITHOUT CARDIOVERSION N/A 04/03/2015   Procedure: TRANSESOPHAGEAL ECHOCARDIOGRAM (TEE);  Surgeon: Ivin Poot, MD;  Location: [place];  Service: Open Heart Surgery;  Laterality: N/A;      Chief Complaint  Patient presents with   Hypertension      HPI:    [name]  is a 70 y.o. male with past medical history relevant for CAD with prior CABG, stage II uncontrolled HTN, HLD, ESRD on Monday Wednesday Friday dialysis schedule presents to the ED with persistently elevated BP -Patient states he had a mild headache this morning but no headache since then -Patient states he was told to stop minoxidil after his recent hospitalization in July 2022, BP has been difficult to control since then -Recently saw his PCP who apparently stopped amlodipine and BP went high after stopping his amlodipine and starting him on  hydralazine 25 mg 3 times daily -At this time patient denies headaches, visual disturbance, no neck pain, no chest pains no palpitations no dizziness, no shortness of breath -Patient states he feels like his usual self, no extremity numbness or weakness No fever  Or chills   No Nausea, Vomiting or Diarrhea  -- No leg pains or leg swelling  -CBC with a white count of 6.2 hemoglobin is 10.9, platelets 189 -Chemistry with a sodium of 128, glucose is 129, creatinine 7.75    Review of systems:    In addition to the HPI above,   A full Review of  Systems was done, all other systems reviewed are negative except as noted above in HPI , .    Social History:  Reviewed by me  Social History   Tobacco Use   Smoking status: Never   Smokeless tobacco: Former    Types: Chew    Quit date: 06/14/2010  Substance Use Topics   Alcohol use: No       Family History :  Reviewed by me    Family History  Problem Relation Age of Onset   Diabetes Mother    Hypertension Mother    Diabetes Father    Hypertension Father    Diabetes Other    Allergies as of 02/01/2021       Reactions   Shellfish Allergy Hives        Medication List     STOP taking these medications    amoxicillin-clavulanate 500-125 MG tablet Commonly known as: AUGMENTIN   Nitrostat 0.4 MG SL tablet Generic drug: nitroGLYCERIN   Normal Saline Flush 0.9 % Soln   sildenafil 50 MG tablet Commonly known as: VIAGRA       TAKE these medications    acetaminophen 500 MG tablet Commonly known as: TYLENOL Take 325 mg by mouth every 6 (six) hours as needed for mild pain or moderate pain.   albuterol 108 (90 Base) MCG/ACT inhaler Commonly known as: VENTOLIN HFA Inhale 2 puffs into the lungs every 6 (six) hours as needed.   amitriptyline 25 MG tablet Commonly known as: ELAVIL Take 25 mg by mouth at bedtime.   amLODipine 10 MG tablet Commonly known as: NORVASC Take 1 tablet (10 mg total) by mouth  daily.   aspirin EC 325 MG tablet Take 325mg s once daily on non dialysis days. Dialysis days are Mon, Wed, Fri   atorvastatin 80 MG tablet Commonly known as: LIPITOR Take 1 tablet (80 mg total) by mouth daily at 6 PM.   carvedilol 25 MG tablet Commonly known as: COREG Take 1 tablet (25 mg total) by mouth 2 (two) times daily.   cloNIDine 0.2 MG tablet Commonly known as: CATAPRES Take 1 tablet (0.2 mg total) by mouth 2 (two) times daily.   DAILY VITAMIN PO Take 1 tablet by mouth daily as needed (supplement).   diphenhydramine-acetaminophen 25-500 MG Tabs tablet Commonly known as: TYLENOL PM Take 1 tablet by mouth at bedtime as needed (sleep).   Fish Oil 1000 MG Caps Take 1,000 mg by mouth daily.   hydrALAZINE 50 MG tablet Commonly known as: APRESOLINE Take 1 tablet (50 mg total) by mouth 3 (three) times daily. What changed:  medication strength how much to take when to take this   insulin glargine 100 UNIT/ML injection Commonly known as: LANTUS Inject 10-15 Units into the skin at bedtime. Sliding scale over 200   latanoprost 0.005 % ophthalmic solution Commonly known as: XALATAN Place 2 drops into both eyes 2 (two) times daily.   magnesium hydroxide 400 MG/5ML suspension Commonly known as: MILK OF MAGNESIA Take 15 mLs by mouth daily as needed for mild constipation.   meclizine 25 MG tablet Commonly known as: ANTIVERT Take 1 tablet (25 mg total) by mouth 3 (three) times daily as needed for dizziness.   minoxidil 10 MG tablet Commonly known as: LONITEN Take 0.5 tablets (5 mg total) by mouth at bedtime. Patient does not take as prescribed. Sometimes takes a 1/2 tablet depending on BP readings   MIRCERA IJ Mircera   OVER THE COUNTER MEDICATION Take 1 tablet by mouth daily. Beet Root   SENSIPAR PO Take by mouth. Given at the clinic [facility]   sevelamer carbonate 800 MG tablet Commonly known  as: RENVELA 1 tablet with meals   Velphoro 500 MG  chewable tablet Generic drug: sucroferric oxyhydroxide Chew 1,500 mg by mouth 3 (three) times daily with meals.          Home Medications:   Prior to Admission medications   Medication Sig Start Date End Date Taking? Authorizing Provider  acetaminophen (TYLENOL) 500 MG tablet Take 325 mg by mouth every 6 (six) hours as needed for mild pain or moderate pain.   Yes [provider]  albuterol (PROVENTIL HFA;VENTOLIN HFA) 108 (90 Base) MCG/ACT inhaler Inhale 2 puffs into the lungs every 6 (six) hours as needed. 07/29/18  Yes Rancour, Annie Main, MD  amitriptyline (ELAVIL) 25 MG tablet Take 25 mg by mouth at bedtime. 01/21/21  Yes [provider]  aspirin EC 325 MG tablet Take 325mg s once daily on non dialysis days. Dialysis days are Mon, Wed, Fri   Yes [provider]  Cinacalcet HCl (SENSIPAR PO) Take by mouth. Given at the clinic [facility] 01/30/21 01/29/22 Yes [provider]  cloNIDine (CATAPRES) 0.2 MG tablet Take 1 tablet (0.2 mg total) by mouth 2 (two) times daily. 11/17/20  Yes Barton Dubois, MD  insulin glargine (LANTUS) 100 UNIT/ML injection Inject 10-15 Units into the skin at bedtime. Sliding scale over 200   Yes [provider]  latanoprost (XALATAN) 0.005 % ophthalmic solution Place 2 drops into both eyes 2 (two) times daily. 08/27/16  Yes [provider]  magnesium hydroxide (MILK OF MAGNESIA) 400 MG/5ML suspension Take 15 mLs by mouth daily as needed for mild constipation.   Yes [provider]  Methoxy PEG-Epoetin Beta (MIRCERA IJ) Mircera 01/19/21 01/18/22 Yes [provider]  Multiple Vitamin (DAILY VITAMIN PO) Take 1 tablet by mouth daily as needed (supplement).   Yes [provider]  Omega-3 Fatty Acids (FISH OIL) 1000 MG CAPS Take 1,000 mg by mouth daily.   Yes [provider]  OVER THE COUNTER MEDICATION Take 1 tablet by mouth daily. Beet Root   Yes [provider]  VELPHORO  500 MG chewable tablet Chew 1,500 mg by mouth 3 (three) times daily with meals. 01/30/21  Yes [provider]  amLODipine (NORVASC) 10 MG tablet Take 1 tablet (10 mg total) by mouth daily. 02/01/21   Roxan Hockey, MD  atorvastatin (LIPITOR) 80 MG tablet Take 1 tablet (80 mg total) by mouth daily at 6 PM. Patient not taking: No sig reported 04/10/15   Nani Skillern, PA-C  carvedilol (COREG) 25 MG tablet Take 1 tablet (25 mg total) by mouth 2 (two) times daily. 02/01/21   Roxan Hockey, MD  diphenhydramine-acetaminophen (TYLENOL PM) 25-500 MG TABS tablet Take 1 tablet by mouth at bedtime as needed (sleep). Patient not taking: No sig reported    [provider]  hydrALAZINE (APRESOLINE) 50 MG tablet Take 1 tablet (50 mg total) by mouth 3 (three) times daily. 02/01/21   Roxan Hockey, MD  meclizine (ANTIVERT) 25 MG tablet Take 1 tablet (25 mg total) by mouth 3 (three) times daily as needed for dizziness. Patient not taking: No sig reported 11/17/20   Barton Dubois, MD  minoxidil (LONITEN) 10 MG tablet Take 0.5 tablets (5 mg total) by mouth at bedtime. Patient does not take as prescribed. Sometimes takes a 1/2 tablet depending on BP readings 02/01/21   Roxan Hockey, MD  sevelamer carbonate (RENVELA) 800 MG tablet 1 tablet with meals Patient not taking: No sig reported    [provider]  Allergies:     Allergies  Allergen Reactions   Shellfish Allergy Hives     Physical Exam:   Vitals  Blood pressure (!) 221/88, pulse 72, temperature 98.2 F (36.8 C), temperature source Oral, resp. rate 16, height 6\' 5"  (1.956 m), weight 86.5 kg, SpO2 94 %.  Physical Examination: General appearance - alert, well appearing, and in no distress  Mental status - alert, oriented to person, place, and time,  Eyes - sclera anicteric Neck - supple, no JVD elevation , Chest - clear  to auscultation bilaterally, symmetrical air movement, Heart - S1 and S2 normal,  regular , CABG Scar Abdomen - soft, nontender, nondistended, no masses or organomegaly Neurological - screening mental status exam normal, neck supple without rigidity, cranial nerves II through XII intact, DTR's normal and symmetric Extremities - no pedal edema noted, intact peripheral pulses  Skin - warm, dry     Data Review:    CBC Recent Labs  Lab 02/01/21 1449  WBC 6.2  HGB 10.9*  HCT 33.3*  PLT 189  MCV 96.0  MCH 31.4  MCHC 32.7  RDW 18.1*  LYMPHSABS 1.0  MONOABS 0.4  EOSABS 0.3  BASOSABS 0.1   ------------------------------------------------------------------------------------------------------------------  Chemistries  Recent Labs  Lab 02/01/21 1449  NA 128*  K 4.2  CL 88*  CO2 29  GLUCOSE 129*  BUN 55*  CREATININE 7.75*  CALCIUM 9.2   ------------------------------------------------------------------------------------------------------------------ estimated creatinine clearance is 11 mL/min (A) (by C-G formula based on SCr of 7.75 mg/dL (H)). ------------------------------------------------------------------------------------------------------------------ No results for input(s): TSH, T4TOTAL, T3FREE, THYROIDAB in the last 72 hours.  Invalid input(s): FREET3   Coagulation profile No results for input(s): INR, PROTIME in the last 168 hours. ------------------------------------------------------------------------------------------------------------------- No results for input(s): DDIMER in the last 72 hours. -------------------------------------------------------------------------------------------------------------------  Cardiac Enzymes No results for input(s): CKMB, TROPONINI, MYOGLOBIN in the last 168 hours.  Invalid input(s): CK ------------------------------------------------------------------------------------------------------------------    Component Value Date/Time   BNP 689.0 (H) 12/04/2020 1334      ---------------------------------------------------------------------------------------------------------------  Urinalysis    Component Value Date/Time   COLORURINE YELLOW 04/03/2015 0306   APPEARANCEUR TURBID (A) 04/03/2015 0306   LABSPEC 1.021 04/03/2015 0306   PHURINE 6.5 04/03/2015 0306   GLUCOSEU 250 (A) 04/03/2015 0306   HGBUR MODERATE (A) 04/03/2015 0306   BILIRUBINUR NEGATIVE 04/03/2015 0306   KETONESUR NEGATIVE 04/03/2015 0306   PROTEINUR >300 (A) 04/03/2015 0306   UROBILINOGEN 0.2 04/03/2015 0306   NITRITE NEGATIVE 04/03/2015 0306   LEUKOCYTESUR LARGE (A) 04/03/2015 0306     Imaging Results:    DG Chest Port 1 View  Result Date: 02/01/2021 CLINICAL DATA:  Hypertensive urgency EXAM: PORTABLE CHEST 1 VIEW COMPARISON:  Radiograph 12/14/2020 FINDINGS: Unchanged mildly enlarged cardiac silhouette with prior median sternotomy and postsurgical changes of CABG. There is a moderate size right pleural effusion with adjacent right basilar consolidation. This is similar in size in comparison to prior exam. No visible pneumothorax. No acute osseous abnormality. IMPRESSION: Unchanged moderate right pleural effusion with adjacent basilar atelectasis. Electronically Signed   By: Maurine Simmering M.D.   On: 02/01/2021 15:15    Radiological Exams on Admission: DG Chest Port 1 View  Result Date: 02/01/2021 CLINICAL DATA:  Hypertensive urgency EXAM: PORTABLE CHEST 1 VIEW COMPARISON:  Radiograph 12/14/2020 FINDINGS: Unchanged mildly enlarged cardiac silhouette with prior median sternotomy and postsurgical changes of CABG. There is a moderate size right pleural effusion with adjacent right basilar consolidation. This is similar in size in comparison to prior exam. No  visible pneumothorax. No acute osseous abnormality. IMPRESSION: Unchanged moderate right pleural effusion with adjacent basilar atelectasis. Electronically Signed   By: Maurine Simmering M.D.   On: 02/01/2021 15:15    Family  Communication:   patients condition and plan of care including tests being ordered have been discussed with the patient  who indicate understanding and agree with the plan   Code Status - Full Code  Likely DC to  home   Condition   stable  Roxan Hockey M.D on 02/01/2021 at 6:00 PM Go to [email or web address] -  for contact info  [facility] - Office  [phone]

## 2021-02-01 NOTE — ED Notes (Addendum)
Dr. Perfecto Kingdom at bedside confirmed pt able to be discharged at this time.

## 2021-02-01 NOTE — Discharge Instructions (Signed)
1)--Your blood pressure is Not well controlled --- it is running too high please restart minoxidil at 5 mg every bedtime, also restart amlodipine 10 mg once daily, please increase hydralazine to 50 mg 3 times daily, please continue carvedilol/Coreg 25 mg twice daily, please continue clonidine 0.2 mg twice daily  2)Please recheck your blood pressure at your Hemodialysis center on Mondays Wednesdays and Fridays and keep a diary of your blood pressure reading to take to your Nephrologist and Primary care physician so that they can adjust the blood pressure medications further over the next week or 2  3)Avoid ibuprofen/Advil/Aleve/Motrin/Goody Powders/Naproxen/BC powders/Meloxicam/Diclofenac/Indomethacin and other Nonsteroidal anti-inflammatory medications as these will make you more likely to bleed and can cause stomach ulcers, can also cause Kidney problems.   4)Very low-salt diet advised  5)Weigh yourself daily, call if you gain more than 3 pounds in 1 day or more than 5 pounds in 1 week as your hemodialysis sessions may need to be adjusted  6)Limit your Fluid  intake to no more than 60 ounces (1.8 Liters) per day

## 2021-02-01 NOTE — ED Notes (Signed)
Discharge instructions discussed with pt. Pt was able to verbalize understanding with no questions at this time. Pt to go home with spouse.

## 2021-02-01 NOTE — ED Provider Notes (Signed)
[facility] EMERGENCY DEPARTMENT Provider Note   CSN: [id number] Arrival date & time: 02/01/21  1342     History Chief Complaint  Patient presents with   Hypertension    [name] is a 70 y.o. male with a history of diabetes, hypertension, CAD with history of MI, STEMI and bypass surgery and end-stage renal disease on dialysis, MWF, no missed treatments, presenting for evaluation of persistently elevated blood pressures which have been worsened since discharge from [facility] last month for suspected liver abscess. During this hospitalization he had episodes of hypotension so bp meds were held.  Upon dc, his bp was elevated so home bp meds were resumed, but pt understood to stop taking his minoxide, yet dc summary does not reflect this.  He states this medicine is the only med that has helped control his bp. He was seen by his pcp last week who also switched his amlodipine to hydralazine, which also is not helping.  BP's per his home bp cuff 230-249/105 - 115 today.  He endorses mild headache, denies n/v, chest pain, sob, vision changes, no focal weakness.  He has chronic left leg edema, unchanged today.    The history is provided by the patient.      Past Medical History:  Diagnosis Date   Arthritis    CAD (coronary artery disease)    STEMI with LAD stent by [facility] in 2005   Dependence on renal dialysis [facility])    Depression    Diabetes mellitus without complication ([place])    History of kidney stones    Hypertension    Kidney infection    MI (mitral incompetence)    Myocardial infarction ([place])    Seasonal allergies     Patient Active Problem List   Diagnosis Date Noted   Stage 2 hypertension 02/01/2021   Liver lesions (Infection Vs Metastasis) 12/10/2020   DM (diabetes mellitus) ([place]) 12/10/2020   Hypo-osmolality and hyponatremia 11/28/2020   Unsteady gait    Ataxia 11/16/2020   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   NPDR (nonproliferative diabetic retinopathy) ([place])  11/21/2016   Pseudophakia of both eyes 11/21/2016   History of adenomatous polyp of colon 09/14/2016   Coronary artery disease involving native coronary artery of native heart without angina pectoris 12/05/2015   S/P CABG (coronary artery bypass graft) 12/05/2015   Secondary hypertension, unspecified    Acute diastolic heart failure (HCC)    Volume overload 03/31/2015   NSTEMI (non-ST elevated myocardial infarction) ([place]) 03/31/2015   Hypoxia 03/21/2015   Fluid overload, unspecified 08/21/2014   End stage renal disease ([place]) 03/07/2014   Encounter for screening for respiratory tuberculosis 02/08/2014   Shortness of breath 02/05/2014   ESRD (end stage renal disease) ([place]) 02/05/2014   Aftercare including intermittent dialysis ([place]) 02/05/2014   Coagulation defect, unspecified ([place]) 02/05/2014   Disorder of phosphorus metabolism, unspecified 02/05/2014   Hypoglycemia, unspecified 02/05/2014   Iron deficiency anemia, unspecified 02/05/2014   Moderate protein-calorie malnutrition ([place]) 02/05/2014   Other disorders of plasma-protein metabolism, not elsewhere classified 02/05/2014   Pain, unspecified 02/05/2014   Pruritus, unspecified 02/05/2014   Secondary hyperparathyroidism of renal origin ([place]) 02/05/2014   Thrombocytopenia, unspecified ([place]) 02/05/2014   Chronic kidney disease 01/29/2014   Acute on chronic renal failure ([place]) 01/14/2014   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   Hyponatremia 01/14/2014   Hyperkalemia 01/14/2014   Type 2 diabetes mellitus without complications ([place]) 51/88/4166   HLD (hyperlipidemia) 09/02/2009   Essential (primary) hypertension 09/02/2009  Coronary atherosclerosis 09/02/2009    Past Surgical History:  Procedure Laterality Date   AV FISTULA PLACEMENT Left 01/30/2014   Procedure: INSERTION OF ARTERIOVENOUS (AV) GORE-TEX GRAFT LEFT UPPER ARM;  Surgeon: Elam Dutch, MD;  Location: [place];  Service: Vascular;  Laterality: Left;   BACK  SURGERY     CARDIAC CATHETERIZATION N/A 04/01/2015   Procedure: Right/Left Heart Cath and Coronary Angiography;  Surgeon: Leonie Man, MD;  Location: [place] [facility];  Service: Cardiovascular;  Laterality: N/A;   COLONOSCOPY N/A 10/12/2016   Procedure: COLONOSCOPY;  Surgeon: Daneil Dolin, MD;  [facility];  Service: Endoscopy;  Laterality: N/A;  2:00pm   COLONOSCOPY W/ POLYPECTOMY     CORONARY ANGIOPLASTY WITH STENT PLACEMENT  2008   CORONARY ARTERY BYPASS GRAFT N/A 04/03/2015   Procedure: CORONARY ARTERY BYPASS GRAFTING (CABG) x  4 (LIMA to LAD, SVG to DIAGONAL, SVG to OM1, and SVG to RCA) with EVH from right greater saphenous thigh and partial lower leg vein ;  Surgeon: Ivin Poot, MD;  Location: [place];  Service: Open Heart Surgery;  Laterality: N/A;   EYE SURGERY Right    lens implant   INSERTION OF DIALYSIS CATHETER Right 01/30/2014   Procedure: INSERTION OF DIALYSIS CATHETER-RIGHT INTERNAL JUGULAR;  Surgeon: Elam Dutch, MD;  Location: [place];  Service: Vascular;  Laterality: Right;   IR RADIOLOGIST EVAL & MGMT  12/30/2020   IR RADIOLOGIST EVAL & MGMT  01/08/2021   POLYPECTOMY  10/12/2016   Procedure: POLYPECTOMY;  Surgeon: Daneil Dolin, MD;  [facility];  Service: Endoscopy;;  colon   SPINE SURGERY     TEE WITHOUT CARDIOVERSION N/A 04/03/2015   Procedure: TRANSESOPHAGEAL ECHOCARDIOGRAM (TEE);  Surgeon: Ivin Poot, MD;  Location: [place];  Service: Open Heart Surgery;  Laterality: N/A;       Family History  Problem Relation Age of Onset   Diabetes Mother    Hypertension Mother    Diabetes Father    Hypertension Father    Diabetes Other     Social History   Tobacco Use   Smoking status: Never   Smokeless tobacco: Former    Types: Chew    Quit date: 06/14/2010  Vaping Use   Vaping Use: Never used  Substance Use Topics   Alcohol use: No   Drug use: No    Home Medications Prior to Admission medications   Medication Sig Start Date  End Date Taking? Authorizing Provider  acetaminophen (TYLENOL) 500 MG tablet Take 325 mg by mouth every 6 (six) hours as needed for mild pain or moderate pain.   Yes [provider]  albuterol (PROVENTIL HFA;VENTOLIN HFA) 108 (90 Base) MCG/ACT inhaler Inhale 2 puffs into the lungs every 6 (six) hours as needed. 07/29/18  Yes Rancour, Annie Main, MD  amitriptyline (ELAVIL) 25 MG tablet Take 25 mg by mouth at bedtime. 01/21/21  Yes [provider]  aspirin EC 325 MG tablet Take 325mg s once daily on non dialysis days. Dialysis days are Mon, Wed, Fri   Yes [provider]  Cinacalcet HCl (SENSIPAR PO) Take by mouth. Given at the clinic [facility] 01/30/21 01/29/22 Yes [provider]  insulin glargine (LANTUS) 100 UNIT/ML injection Inject 10-15 Units into the skin at bedtime. Sliding scale over 200   Yes [provider]  latanoprost (XALATAN) 0.005 % ophthalmic solution Place 2 drops into both eyes 2 (two) times daily. 08/27/16  Yes [provider]  magnesium hydroxide (MILK OF MAGNESIA) 400 MG/5ML suspension Take 15 mLs by mouth daily as needed for mild constipation.   Yes [provider]  Methoxy PEG-Epoetin Beta (MIRCERA IJ) Mircera 01/19/21 01/18/22 Yes [provider]  Multiple Vitamin (DAILY VITAMIN PO) Take 1 tablet by mouth daily as needed (supplement).   Yes [provider]  Omega-3 Fatty Acids (FISH OIL) 1000 MG CAPS Take 1,000 mg by mouth daily.   Yes [provider]  OVER THE COUNTER MEDICATION Take 1 tablet by mouth daily. Beet Root   Yes [provider]  VELPHORO 500 MG chewable tablet Chew 1,500 mg by mouth 3 (three) times daily with meals. 01/30/21  Yes [provider]  amLODipine (NORVASC) 10 MG tablet Take 1 tablet (10 mg total) by mouth daily. 02/01/21   Roxan Hockey, MD  atorvastatin (LIPITOR) 80 MG tablet Take 1 tablet (80 mg total) by mouth daily at 6 PM. Patient not taking:  No sig reported 04/10/15   Nani Skillern, PA-C  carvedilol (COREG) 25 MG tablet Take 1 tablet (25 mg total) by mouth 2 (two) times daily. 02/01/21   Roxan Hockey, MD  cloNIDine (CATAPRES) 0.2 MG tablet Take 1 tablet (0.2 mg total) by mouth 2 (two) times daily. 02/01/21   Roxan Hockey, MD  diphenhydramine-acetaminophen (TYLENOL PM) 25-500 MG TABS tablet Take 1 tablet by mouth at bedtime as needed (sleep). Patient not taking: No sig reported    [provider]  hydrALAZINE (APRESOLINE) 50 MG tablet Take 1 tablet (50 mg total) by mouth 3 (three) times daily. 02/01/21   Roxan Hockey, MD  meclizine (ANTIVERT) 25 MG tablet Take 1 tablet (25 mg total) by mouth 3 (three) times daily as needed for dizziness. Patient not taking: No sig reported 11/17/20   Barton Dubois, MD  minoxidil (LONITEN) 10 MG tablet Take 0.5 tablets (5 mg total) by mouth at bedtime. Patient does not take as prescribed. Sometimes takes a 1/2 tablet depending on BP readings 02/01/21   Roxan Hockey, MD  sevelamer carbonate (RENVELA) 800 MG tablet 1 tablet with meals Patient not taking: No sig reported    [provider]    Allergies    Shellfish allergy  Review of Systems   Review of Systems  Constitutional:  Negative for chills and fever.  HENT:  Negative for congestion and sore throat.   Eyes: Negative.   Respiratory:  Negative for chest tightness and shortness of breath.   Cardiovascular:  Positive for leg swelling. Negative for chest pain.  Gastrointestinal:  Negative for abdominal pain, nausea and vomiting.  Genitourinary: Negative.   Musculoskeletal:  Negative for arthralgias, joint swelling and neck pain.  Skin: Negative.  Negative for rash and wound.  Neurological:  Positive for headaches. Negative for dizziness, weakness, light-headedness and numbness.  Psychiatric/Behavioral: Negative.     Physical Exam Updated Vital Signs BP (!) 252/97   Pulse 75   Temp 98.2 F (36.8 C)  (Oral)   Resp 18   Ht 6\' 5"  (1.956 m)   Wt 86.5 kg   SpO2 (!) 84%   BMI 22.61 kg/m   Physical Exam Vitals and nursing note reviewed.  Constitutional:      Appearance: He is well-developed.  HENT:     Head: Normocephalic and atraumatic.  Eyes:     Conjunctiva/sclera: Conjunctivae normal.  Cardiovascular:     Rate and Rhythm: Normal rate and regular rhythm.     Heart sounds: Normal heart sounds.  Pulmonary:     Effort: Pulmonary effort is normal.     Breath sounds: Normal breath sounds. No wheezing.  Abdominal:     General: Bowel sounds are normal.     Palpations: Abdomen is soft.     Tenderness: There is no abdominal tenderness.  Musculoskeletal:        General: Normal range of motion.     Cervical back: Normal range of motion.  Skin:    General: Skin is warm and dry.  Neurological:     Mental Status: He is alert.    ED Results / Procedures / Treatments   Labs (all labs ordered are listed, but only abnormal results are displayed) Labs Reviewed  BASIC METABOLIC PANEL - Abnormal; Notable for the following components:      Result Value   Sodium 128 (*)    Chloride 88 (*)    Glucose, Bld 129 (*)    BUN 55 (*)    Creatinine, Ser 7.75 (*)    GFR, Estimated 7 (*)    All other components within normal limits  CBC WITH DIFFERENTIAL/PLATELET - Abnormal; Notable for the following components:   RBC 3.47 (*)    Hemoglobin 10.9 (*)    HCT 33.3 (*)    RDW 18.1 (*)    All other components within normal limits    EKG None  Radiology DG Chest Port 1 View  Result Date: 02/01/2021 CLINICAL DATA:  Hypertensive urgency EXAM: PORTABLE CHEST 1 VIEW COMPARISON:  Radiograph 12/14/2020 FINDINGS: Unchanged mildly enlarged cardiac silhouette with prior median sternotomy and postsurgical changes of CABG. There is a moderate size right pleural effusion with adjacent right basilar consolidation. This is similar in size in comparison to prior exam. No visible pneumothorax. No acute  osseous abnormality. IMPRESSION: Unchanged moderate right pleural effusion with adjacent basilar atelectasis. Electronically Signed   By: Maurine Simmering M.D.   On: 02/01/2021 15:15    Procedures Procedures   Medications Ordered in ED Medications  minoxidil (LONITEN) tablet 5 mg (has no administration in time range)  labetalol (NORMODYNE) injection 20 mg (20 mg Intravenous Given 02/01/21 1850)  labetalol (NORMODYNE) injection 10 mg (10 mg Intravenous Given 02/01/21 1535)  hydrALAZINE (APRESOLINE) injection 10 mg (10 mg Intravenous Given 02/01/21 1615)  amLODipine (NORVASC) tablet 10 mg (10 mg Oral Given 02/01/21 1801)    ED Course  I have reviewed the triage vital signs and the nursing notes.  Pertinent labs & imaging results that were available during my care of the patient were reviewed by me and considered in my medical decision making (see chart for details).    MDM Rules/Calculators/A&P                           Patient with persistently elevated blood pressures despite IV treatment with labetalol followed by hydralazine, blood pressures still at 409 systolic.  However this is improved from his 257 at presentation.  His creatinine is elevated today at 7.75, however he is a dialysis patient his last creatinine 3.76  25-month ago.  He has a mild headache, no focal neurologic deficits.  No chest pain or shortness of breath.  A nicardipine drip has been started with goal of blood pressure 180 initially.    Discussed with Dr. Matilde Sprang prior to decision to admit pt.   Discussed with Dr. Denton Brick who will consult pt in ed.  Probably will plan medication adjustment and dispo home with close  followup  since he is a dialysis pt and will have close f/u tomorrow .  No signs of any new end organ damage today, specifically no CP, sob, labs stable, ekg stable.  Mild headache, no neuro deficits.  Please refer to Dr Talmadge Coventry note for details of medication changes and plan.    Final Clinical Impression(s) /  ED Diagnoses Final diagnoses:  Primary hypertension    Rx / DC Orders ED Discharge Orders          Ordered    amLODipine (NORVASC) 10 MG tablet  Daily,   Status:  Discontinued        02/01/21 1759    minoxidil (LONITEN) 10 MG tablet  Daily at bedtime,   Status:  Discontinued        02/01/21 1759    carvedilol (COREG) 25 MG tablet  2 times daily,   Status:  Discontinued        02/01/21 1759    hydrALAZINE (APRESOLINE) 50 MG tablet  3 times daily,   Status:  Discontinued        02/01/21 1759    Increase activity slowly        02/01/21 1759    Diet - low sodium heart healthy        02/01/21 1759    Discharge instructions       Comments: 1)--Your blood pressure is Not well controlled --- it is running too high please restart minoxidil at 5 mg every bedtime, also restart amlodipine 10 mg once daily, please increase hydralazine to 50 mg 3 times daily, please continue carvedilol/Coreg 25 mg twice daily, please continue clonidine 0.2 mg twice daily  2)Please recheck your blood pressure at your Hemodialysis center on Mondays Wednesdays and Fridays and keep a diary of your blood pressure reading to take to your Nephrologist and Primary care physician so that they can adjust the blood pressure medications further over the next week or 2  3)Avoid ibuprofen/Advil/Aleve/Motrin/Goody Powders/Naproxen/BC powders/Meloxicam/Diclofenac/Indomethacin and other Nonsteroidal anti-inflammatory medications as these will make you more likely to bleed and can cause stomach ulcers, can also cause Kidney problems.   4)Very low-salt diet advised  5)Weigh yourself daily, call if you gain more than 3 pounds in 1 day or more than 5 pounds in 1 week as your hemodialysis sessions may need to be adjusted  6)Limit your Fluid  intake to no more than 60 ounces (1.8 Liters) per day   02/01/21 1759    Call MD for:  temperature >100.4        02/01/21 1759    Call MD for:  persistant nausea and vomiting        02/01/21  1759    Call MD for:  difficulty breathing, headache or visual disturbances        02/01/21 1759    Call MD for:  persistant dizziness or light-headedness        02/01/21 1759    Call MD for:  severe uncontrolled pain        02/01/21 1759    amLODipine (NORVASC) 10 MG tablet  Daily        02/01/21 1909    carvedilol (COREG) 25 MG tablet  2 times daily        02/01/21 1909    cloNIDine (CATAPRES) 0.2 MG tablet  2 times daily        02/01/21 1909    hydrALAZINE (APRESOLINE) 50 MG tablet  3 times daily  02/01/21 1909    minoxidil (LONITEN) 10 MG tablet  Daily at bedtime        02/01/21 1909             Landis Martins 02/01/21 1931    Kommor, Debe Coder, MD 02/02/21 [phone]

## 2021-02-01 NOTE — ED Triage Notes (Signed)
Pt states he has had hypertension for the last month with systolic over 403.  Pt has seen his PCP and medication adjustments have been made without resolution.

## 2021-02-01 NOTE — ED Notes (Signed)
Pt states was recently hospitalized for a liver abcess, and during the admission, several changes were made to medication regimen, b/p has been elevated ever since.

## 2021-02-24 ENCOUNTER — Ambulatory Visit: Payer: Medicare Other | Admitting: Internal Medicine

## 2021-02-24 ENCOUNTER — Telehealth: Payer: Self-pay | Admitting: Internal Medicine

## 2021-02-24 ENCOUNTER — Encounter: Payer: Self-pay | Admitting: Internal Medicine

## 2021-02-24 NOTE — Telephone Encounter (Signed)
Patient missed his appointment with me today.  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  Sounds like that is not an active problem now. I note he had an abnormal rectum on CT earlier this year.  Multiple colonic adenomas removed over time-last colonoscopy 2018; he was due last year.  Please reach out to him and let him know he needs to have a colonoscopy.  If he does not get it here he needs to get it somewhere.

## 2021-02-24 NOTE — Telephone Encounter (Signed)
Pt states that he was unaware of the appointment with Dr. Gala Romney today. Pt states that he will call back to schedule for a colonoscopy. Advised the pt of the importance of him having one. Pt verbalized understanding.

## 2021-08-12 DIAGNOSIS — D689 Coagulation defect, unspecified: Secondary | ICD-10-CM | POA: Diagnosis not present

## 2021-08-12 DIAGNOSIS — E871 Hypo-osmolality and hyponatremia: Secondary | ICD-10-CM | POA: Diagnosis not present

## 2021-08-12 DIAGNOSIS — D696 Thrombocytopenia, unspecified: Secondary | ICD-10-CM | POA: Diagnosis not present

## 2021-08-12 DIAGNOSIS — D509 Iron deficiency anemia, unspecified: Secondary | ICD-10-CM | POA: Diagnosis not present

## 2021-08-12 DIAGNOSIS — N186 End stage renal disease: Secondary | ICD-10-CM | POA: Diagnosis not present

## 2021-08-12 DIAGNOSIS — N2581 Secondary hyperparathyroidism of renal origin: Secondary | ICD-10-CM | POA: Diagnosis not present

## 2021-08-12 DIAGNOSIS — Z992 Dependence on renal dialysis: Secondary | ICD-10-CM | POA: Diagnosis not present

## 2021-08-14 DIAGNOSIS — D509 Iron deficiency anemia, unspecified: Secondary | ICD-10-CM | POA: Diagnosis not present

## 2021-08-14 DIAGNOSIS — D696 Thrombocytopenia, unspecified: Secondary | ICD-10-CM | POA: Diagnosis not present

## 2021-08-14 DIAGNOSIS — Z992 Dependence on renal dialysis: Secondary | ICD-10-CM | POA: Diagnosis not present

## 2021-08-14 DIAGNOSIS — D689 Coagulation defect, unspecified: Secondary | ICD-10-CM | POA: Diagnosis not present

## 2021-08-14 DIAGNOSIS — N2581 Secondary hyperparathyroidism of renal origin: Secondary | ICD-10-CM | POA: Diagnosis not present

## 2021-08-14 DIAGNOSIS — N186 End stage renal disease: Secondary | ICD-10-CM | POA: Diagnosis not present

## 2021-08-14 DIAGNOSIS — E871 Hypo-osmolality and hyponatremia: Secondary | ICD-10-CM | POA: Diagnosis not present

## 2021-08-17 DIAGNOSIS — N2581 Secondary hyperparathyroidism of renal origin: Secondary | ICD-10-CM | POA: Diagnosis not present

## 2021-08-17 DIAGNOSIS — D696 Thrombocytopenia, unspecified: Secondary | ICD-10-CM | POA: Diagnosis not present

## 2021-08-17 DIAGNOSIS — Z992 Dependence on renal dialysis: Secondary | ICD-10-CM | POA: Diagnosis not present

## 2021-08-17 DIAGNOSIS — E871 Hypo-osmolality and hyponatremia: Secondary | ICD-10-CM | POA: Diagnosis not present

## 2021-08-17 DIAGNOSIS — D689 Coagulation defect, unspecified: Secondary | ICD-10-CM | POA: Diagnosis not present

## 2021-08-17 DIAGNOSIS — D509 Iron deficiency anemia, unspecified: Secondary | ICD-10-CM | POA: Diagnosis not present

## 2021-08-17 DIAGNOSIS — N186 End stage renal disease: Secondary | ICD-10-CM | POA: Diagnosis not present

## 2021-08-19 DIAGNOSIS — N2581 Secondary hyperparathyroidism of renal origin: Secondary | ICD-10-CM | POA: Diagnosis not present

## 2021-08-19 DIAGNOSIS — D689 Coagulation defect, unspecified: Secondary | ICD-10-CM | POA: Diagnosis not present

## 2021-08-19 DIAGNOSIS — E871 Hypo-osmolality and hyponatremia: Secondary | ICD-10-CM | POA: Diagnosis not present

## 2021-08-19 DIAGNOSIS — D696 Thrombocytopenia, unspecified: Secondary | ICD-10-CM | POA: Diagnosis not present

## 2021-08-19 DIAGNOSIS — Z992 Dependence on renal dialysis: Secondary | ICD-10-CM | POA: Diagnosis not present

## 2021-08-19 DIAGNOSIS — N186 End stage renal disease: Secondary | ICD-10-CM | POA: Diagnosis not present

## 2021-08-19 DIAGNOSIS — D509 Iron deficiency anemia, unspecified: Secondary | ICD-10-CM | POA: Diagnosis not present

## 2021-08-21 DIAGNOSIS — E871 Hypo-osmolality and hyponatremia: Secondary | ICD-10-CM | POA: Diagnosis not present

## 2021-08-21 DIAGNOSIS — D509 Iron deficiency anemia, unspecified: Secondary | ICD-10-CM | POA: Diagnosis not present

## 2021-08-21 DIAGNOSIS — Z992 Dependence on renal dialysis: Secondary | ICD-10-CM | POA: Diagnosis not present

## 2021-08-21 DIAGNOSIS — N186 End stage renal disease: Secondary | ICD-10-CM | POA: Diagnosis not present

## 2021-08-21 DIAGNOSIS — D696 Thrombocytopenia, unspecified: Secondary | ICD-10-CM | POA: Diagnosis not present

## 2021-08-21 DIAGNOSIS — N2581 Secondary hyperparathyroidism of renal origin: Secondary | ICD-10-CM | POA: Diagnosis not present

## 2021-08-21 DIAGNOSIS — D689 Coagulation defect, unspecified: Secondary | ICD-10-CM | POA: Diagnosis not present

## 2021-08-24 DIAGNOSIS — E871 Hypo-osmolality and hyponatremia: Secondary | ICD-10-CM | POA: Diagnosis not present

## 2021-08-24 DIAGNOSIS — D689 Coagulation defect, unspecified: Secondary | ICD-10-CM | POA: Diagnosis not present

## 2021-08-24 DIAGNOSIS — N186 End stage renal disease: Secondary | ICD-10-CM | POA: Diagnosis not present

## 2021-08-24 DIAGNOSIS — Z992 Dependence on renal dialysis: Secondary | ICD-10-CM | POA: Diagnosis not present

## 2021-08-24 DIAGNOSIS — N2581 Secondary hyperparathyroidism of renal origin: Secondary | ICD-10-CM | POA: Diagnosis not present

## 2021-08-24 DIAGNOSIS — D696 Thrombocytopenia, unspecified: Secondary | ICD-10-CM | POA: Diagnosis not present

## 2021-08-24 DIAGNOSIS — D509 Iron deficiency anemia, unspecified: Secondary | ICD-10-CM | POA: Diagnosis not present

## 2021-08-26 DIAGNOSIS — Z992 Dependence on renal dialysis: Secondary | ICD-10-CM | POA: Diagnosis not present

## 2021-08-26 DIAGNOSIS — D696 Thrombocytopenia, unspecified: Secondary | ICD-10-CM | POA: Diagnosis not present

## 2021-08-26 DIAGNOSIS — D689 Coagulation defect, unspecified: Secondary | ICD-10-CM | POA: Diagnosis not present

## 2021-08-26 DIAGNOSIS — N186 End stage renal disease: Secondary | ICD-10-CM | POA: Diagnosis not present

## 2021-08-26 DIAGNOSIS — E871 Hypo-osmolality and hyponatremia: Secondary | ICD-10-CM | POA: Diagnosis not present

## 2021-08-26 DIAGNOSIS — D509 Iron deficiency anemia, unspecified: Secondary | ICD-10-CM | POA: Diagnosis not present

## 2021-08-26 DIAGNOSIS — N2581 Secondary hyperparathyroidism of renal origin: Secondary | ICD-10-CM | POA: Diagnosis not present

## 2021-08-28 DIAGNOSIS — D689 Coagulation defect, unspecified: Secondary | ICD-10-CM | POA: Diagnosis not present

## 2021-08-28 DIAGNOSIS — N186 End stage renal disease: Secondary | ICD-10-CM | POA: Diagnosis not present

## 2021-08-28 DIAGNOSIS — E871 Hypo-osmolality and hyponatremia: Secondary | ICD-10-CM | POA: Diagnosis not present

## 2021-08-28 DIAGNOSIS — Z992 Dependence on renal dialysis: Secondary | ICD-10-CM | POA: Diagnosis not present

## 2021-08-28 DIAGNOSIS — N2581 Secondary hyperparathyroidism of renal origin: Secondary | ICD-10-CM | POA: Diagnosis not present

## 2021-08-28 DIAGNOSIS — D509 Iron deficiency anemia, unspecified: Secondary | ICD-10-CM | POA: Diagnosis not present

## 2021-08-28 DIAGNOSIS — D696 Thrombocytopenia, unspecified: Secondary | ICD-10-CM | POA: Diagnosis not present

## 2021-08-31 DIAGNOSIS — N2581 Secondary hyperparathyroidism of renal origin: Secondary | ICD-10-CM | POA: Diagnosis not present

## 2021-08-31 DIAGNOSIS — E871 Hypo-osmolality and hyponatremia: Secondary | ICD-10-CM | POA: Diagnosis not present

## 2021-08-31 DIAGNOSIS — D696 Thrombocytopenia, unspecified: Secondary | ICD-10-CM | POA: Diagnosis not present

## 2021-08-31 DIAGNOSIS — D689 Coagulation defect, unspecified: Secondary | ICD-10-CM | POA: Diagnosis not present

## 2021-08-31 DIAGNOSIS — D509 Iron deficiency anemia, unspecified: Secondary | ICD-10-CM | POA: Diagnosis not present

## 2021-08-31 DIAGNOSIS — Z992 Dependence on renal dialysis: Secondary | ICD-10-CM | POA: Diagnosis not present

## 2021-08-31 DIAGNOSIS — N186 End stage renal disease: Secondary | ICD-10-CM | POA: Diagnosis not present

## 2021-09-02 DIAGNOSIS — D509 Iron deficiency anemia, unspecified: Secondary | ICD-10-CM | POA: Diagnosis not present

## 2021-09-02 DIAGNOSIS — D689 Coagulation defect, unspecified: Secondary | ICD-10-CM | POA: Diagnosis not present

## 2021-09-02 DIAGNOSIS — E871 Hypo-osmolality and hyponatremia: Secondary | ICD-10-CM | POA: Diagnosis not present

## 2021-09-02 DIAGNOSIS — N186 End stage renal disease: Secondary | ICD-10-CM | POA: Diagnosis not present

## 2021-09-02 DIAGNOSIS — N2581 Secondary hyperparathyroidism of renal origin: Secondary | ICD-10-CM | POA: Diagnosis not present

## 2021-09-02 DIAGNOSIS — D696 Thrombocytopenia, unspecified: Secondary | ICD-10-CM | POA: Diagnosis not present

## 2021-09-02 DIAGNOSIS — Z992 Dependence on renal dialysis: Secondary | ICD-10-CM | POA: Diagnosis not present

## 2021-09-04 DIAGNOSIS — N2581 Secondary hyperparathyroidism of renal origin: Secondary | ICD-10-CM | POA: Diagnosis not present

## 2021-09-04 DIAGNOSIS — N186 End stage renal disease: Secondary | ICD-10-CM | POA: Diagnosis not present

## 2021-09-04 DIAGNOSIS — E871 Hypo-osmolality and hyponatremia: Secondary | ICD-10-CM | POA: Diagnosis not present

## 2021-09-04 DIAGNOSIS — D696 Thrombocytopenia, unspecified: Secondary | ICD-10-CM | POA: Diagnosis not present

## 2021-09-04 DIAGNOSIS — D509 Iron deficiency anemia, unspecified: Secondary | ICD-10-CM | POA: Diagnosis not present

## 2021-09-04 DIAGNOSIS — D689 Coagulation defect, unspecified: Secondary | ICD-10-CM | POA: Diagnosis not present

## 2021-09-04 DIAGNOSIS — Z992 Dependence on renal dialysis: Secondary | ICD-10-CM | POA: Diagnosis not present

## 2021-09-07 DIAGNOSIS — Z992 Dependence on renal dialysis: Secondary | ICD-10-CM | POA: Diagnosis not present

## 2021-09-07 DIAGNOSIS — D696 Thrombocytopenia, unspecified: Secondary | ICD-10-CM | POA: Diagnosis not present

## 2021-09-07 DIAGNOSIS — N186 End stage renal disease: Secondary | ICD-10-CM | POA: Diagnosis not present

## 2021-09-07 DIAGNOSIS — D509 Iron deficiency anemia, unspecified: Secondary | ICD-10-CM | POA: Diagnosis not present

## 2021-09-07 DIAGNOSIS — N2581 Secondary hyperparathyroidism of renal origin: Secondary | ICD-10-CM | POA: Diagnosis not present

## 2021-09-07 DIAGNOSIS — D689 Coagulation defect, unspecified: Secondary | ICD-10-CM | POA: Diagnosis not present

## 2021-09-07 DIAGNOSIS — E871 Hypo-osmolality and hyponatremia: Secondary | ICD-10-CM | POA: Diagnosis not present

## 2021-09-09 DIAGNOSIS — N2581 Secondary hyperparathyroidism of renal origin: Secondary | ICD-10-CM | POA: Diagnosis not present

## 2021-09-09 DIAGNOSIS — Z992 Dependence on renal dialysis: Secondary | ICD-10-CM | POA: Diagnosis not present

## 2021-09-09 DIAGNOSIS — N186 End stage renal disease: Secondary | ICD-10-CM | POA: Diagnosis not present

## 2021-09-09 DIAGNOSIS — E871 Hypo-osmolality and hyponatremia: Secondary | ICD-10-CM | POA: Diagnosis not present

## 2021-09-09 DIAGNOSIS — D689 Coagulation defect, unspecified: Secondary | ICD-10-CM | POA: Diagnosis not present

## 2021-09-11 DIAGNOSIS — Z992 Dependence on renal dialysis: Secondary | ICD-10-CM | POA: Diagnosis not present

## 2021-09-11 DIAGNOSIS — E871 Hypo-osmolality and hyponatremia: Secondary | ICD-10-CM | POA: Diagnosis not present

## 2021-09-11 DIAGNOSIS — D689 Coagulation defect, unspecified: Secondary | ICD-10-CM | POA: Diagnosis not present

## 2021-09-11 DIAGNOSIS — N186 End stage renal disease: Secondary | ICD-10-CM | POA: Diagnosis not present

## 2021-09-11 DIAGNOSIS — N2581 Secondary hyperparathyroidism of renal origin: Secondary | ICD-10-CM | POA: Diagnosis not present

## 2021-09-14 DIAGNOSIS — Z992 Dependence on renal dialysis: Secondary | ICD-10-CM | POA: Diagnosis not present

## 2021-09-14 DIAGNOSIS — D696 Thrombocytopenia, unspecified: Secondary | ICD-10-CM | POA: Diagnosis not present

## 2021-09-14 DIAGNOSIS — N186 End stage renal disease: Secondary | ICD-10-CM | POA: Diagnosis not present

## 2021-09-14 DIAGNOSIS — N2581 Secondary hyperparathyroidism of renal origin: Secondary | ICD-10-CM | POA: Diagnosis not present

## 2021-09-14 DIAGNOSIS — E871 Hypo-osmolality and hyponatremia: Secondary | ICD-10-CM | POA: Diagnosis not present

## 2021-09-14 DIAGNOSIS — D509 Iron deficiency anemia, unspecified: Secondary | ICD-10-CM | POA: Diagnosis not present

## 2021-09-14 DIAGNOSIS — D689 Coagulation defect, unspecified: Secondary | ICD-10-CM | POA: Diagnosis not present

## 2021-09-14 DIAGNOSIS — D631 Anemia in chronic kidney disease: Secondary | ICD-10-CM | POA: Diagnosis not present

## 2021-09-16 DIAGNOSIS — E871 Hypo-osmolality and hyponatremia: Secondary | ICD-10-CM | POA: Diagnosis not present

## 2021-09-16 DIAGNOSIS — D689 Coagulation defect, unspecified: Secondary | ICD-10-CM | POA: Diagnosis not present

## 2021-09-16 DIAGNOSIS — D696 Thrombocytopenia, unspecified: Secondary | ICD-10-CM | POA: Diagnosis not present

## 2021-09-16 DIAGNOSIS — N2581 Secondary hyperparathyroidism of renal origin: Secondary | ICD-10-CM | POA: Diagnosis not present

## 2021-09-16 DIAGNOSIS — N186 End stage renal disease: Secondary | ICD-10-CM | POA: Diagnosis not present

## 2021-09-16 DIAGNOSIS — Z992 Dependence on renal dialysis: Secondary | ICD-10-CM | POA: Diagnosis not present

## 2021-09-16 DIAGNOSIS — D631 Anemia in chronic kidney disease: Secondary | ICD-10-CM | POA: Diagnosis not present

## 2021-09-16 DIAGNOSIS — D509 Iron deficiency anemia, unspecified: Secondary | ICD-10-CM | POA: Diagnosis not present

## 2021-09-18 DIAGNOSIS — Z992 Dependence on renal dialysis: Secondary | ICD-10-CM | POA: Diagnosis not present

## 2021-09-18 DIAGNOSIS — N186 End stage renal disease: Secondary | ICD-10-CM | POA: Diagnosis not present

## 2021-09-18 DIAGNOSIS — D631 Anemia in chronic kidney disease: Secondary | ICD-10-CM | POA: Diagnosis not present

## 2021-09-18 DIAGNOSIS — D696 Thrombocytopenia, unspecified: Secondary | ICD-10-CM | POA: Diagnosis not present

## 2021-09-18 DIAGNOSIS — D509 Iron deficiency anemia, unspecified: Secondary | ICD-10-CM | POA: Diagnosis not present

## 2021-09-18 DIAGNOSIS — N2581 Secondary hyperparathyroidism of renal origin: Secondary | ICD-10-CM | POA: Diagnosis not present

## 2021-09-18 DIAGNOSIS — E871 Hypo-osmolality and hyponatremia: Secondary | ICD-10-CM | POA: Diagnosis not present

## 2021-09-18 DIAGNOSIS — D689 Coagulation defect, unspecified: Secondary | ICD-10-CM | POA: Diagnosis not present

## 2021-09-21 DIAGNOSIS — D689 Coagulation defect, unspecified: Secondary | ICD-10-CM | POA: Diagnosis not present

## 2021-09-21 DIAGNOSIS — N186 End stage renal disease: Secondary | ICD-10-CM | POA: Diagnosis not present

## 2021-09-21 DIAGNOSIS — D696 Thrombocytopenia, unspecified: Secondary | ICD-10-CM | POA: Diagnosis not present

## 2021-09-21 DIAGNOSIS — D509 Iron deficiency anemia, unspecified: Secondary | ICD-10-CM | POA: Diagnosis not present

## 2021-09-21 DIAGNOSIS — D631 Anemia in chronic kidney disease: Secondary | ICD-10-CM | POA: Diagnosis not present

## 2021-09-21 DIAGNOSIS — Z992 Dependence on renal dialysis: Secondary | ICD-10-CM | POA: Diagnosis not present

## 2021-09-21 DIAGNOSIS — N2581 Secondary hyperparathyroidism of renal origin: Secondary | ICD-10-CM | POA: Diagnosis not present

## 2021-09-21 DIAGNOSIS — E871 Hypo-osmolality and hyponatremia: Secondary | ICD-10-CM | POA: Diagnosis not present

## 2021-09-23 DIAGNOSIS — D696 Thrombocytopenia, unspecified: Secondary | ICD-10-CM | POA: Diagnosis not present

## 2021-09-23 DIAGNOSIS — N186 End stage renal disease: Secondary | ICD-10-CM | POA: Diagnosis not present

## 2021-09-23 DIAGNOSIS — D509 Iron deficiency anemia, unspecified: Secondary | ICD-10-CM | POA: Diagnosis not present

## 2021-09-23 DIAGNOSIS — Z992 Dependence on renal dialysis: Secondary | ICD-10-CM | POA: Diagnosis not present

## 2021-09-23 DIAGNOSIS — D689 Coagulation defect, unspecified: Secondary | ICD-10-CM | POA: Diagnosis not present

## 2021-09-23 DIAGNOSIS — N2581 Secondary hyperparathyroidism of renal origin: Secondary | ICD-10-CM | POA: Diagnosis not present

## 2021-09-23 DIAGNOSIS — D631 Anemia in chronic kidney disease: Secondary | ICD-10-CM | POA: Diagnosis not present

## 2021-09-23 DIAGNOSIS — E871 Hypo-osmolality and hyponatremia: Secondary | ICD-10-CM | POA: Diagnosis not present

## 2021-09-25 DIAGNOSIS — D696 Thrombocytopenia, unspecified: Secondary | ICD-10-CM | POA: Diagnosis not present

## 2021-09-25 DIAGNOSIS — Z992 Dependence on renal dialysis: Secondary | ICD-10-CM | POA: Diagnosis not present

## 2021-09-25 DIAGNOSIS — E871 Hypo-osmolality and hyponatremia: Secondary | ICD-10-CM | POA: Diagnosis not present

## 2021-09-25 DIAGNOSIS — D631 Anemia in chronic kidney disease: Secondary | ICD-10-CM | POA: Diagnosis not present

## 2021-09-25 DIAGNOSIS — N2581 Secondary hyperparathyroidism of renal origin: Secondary | ICD-10-CM | POA: Diagnosis not present

## 2021-09-25 DIAGNOSIS — D509 Iron deficiency anemia, unspecified: Secondary | ICD-10-CM | POA: Diagnosis not present

## 2021-09-25 DIAGNOSIS — N186 End stage renal disease: Secondary | ICD-10-CM | POA: Diagnosis not present

## 2021-09-25 DIAGNOSIS — D689 Coagulation defect, unspecified: Secondary | ICD-10-CM | POA: Diagnosis not present

## 2021-09-28 DIAGNOSIS — D689 Coagulation defect, unspecified: Secondary | ICD-10-CM | POA: Diagnosis not present

## 2021-09-28 DIAGNOSIS — D509 Iron deficiency anemia, unspecified: Secondary | ICD-10-CM | POA: Diagnosis not present

## 2021-09-28 DIAGNOSIS — Z992 Dependence on renal dialysis: Secondary | ICD-10-CM | POA: Diagnosis not present

## 2021-09-28 DIAGNOSIS — N2581 Secondary hyperparathyroidism of renal origin: Secondary | ICD-10-CM | POA: Diagnosis not present

## 2021-09-28 DIAGNOSIS — D696 Thrombocytopenia, unspecified: Secondary | ICD-10-CM | POA: Diagnosis not present

## 2021-09-28 DIAGNOSIS — D631 Anemia in chronic kidney disease: Secondary | ICD-10-CM | POA: Diagnosis not present

## 2021-09-28 DIAGNOSIS — E871 Hypo-osmolality and hyponatremia: Secondary | ICD-10-CM | POA: Diagnosis not present

## 2021-09-28 DIAGNOSIS — N186 End stage renal disease: Secondary | ICD-10-CM | POA: Diagnosis not present

## 2021-09-30 DIAGNOSIS — N2581 Secondary hyperparathyroidism of renal origin: Secondary | ICD-10-CM | POA: Diagnosis not present

## 2021-09-30 DIAGNOSIS — D696 Thrombocytopenia, unspecified: Secondary | ICD-10-CM | POA: Diagnosis not present

## 2021-09-30 DIAGNOSIS — D509 Iron deficiency anemia, unspecified: Secondary | ICD-10-CM | POA: Diagnosis not present

## 2021-09-30 DIAGNOSIS — Z992 Dependence on renal dialysis: Secondary | ICD-10-CM | POA: Diagnosis not present

## 2021-09-30 DIAGNOSIS — D689 Coagulation defect, unspecified: Secondary | ICD-10-CM | POA: Diagnosis not present

## 2021-09-30 DIAGNOSIS — N186 End stage renal disease: Secondary | ICD-10-CM | POA: Diagnosis not present

## 2021-09-30 DIAGNOSIS — D631 Anemia in chronic kidney disease: Secondary | ICD-10-CM | POA: Diagnosis not present

## 2021-09-30 DIAGNOSIS — E871 Hypo-osmolality and hyponatremia: Secondary | ICD-10-CM | POA: Diagnosis not present

## 2021-10-02 DIAGNOSIS — N186 End stage renal disease: Secondary | ICD-10-CM | POA: Diagnosis not present

## 2021-10-02 DIAGNOSIS — D631 Anemia in chronic kidney disease: Secondary | ICD-10-CM | POA: Diagnosis not present

## 2021-10-02 DIAGNOSIS — D509 Iron deficiency anemia, unspecified: Secondary | ICD-10-CM | POA: Diagnosis not present

## 2021-10-02 DIAGNOSIS — Z992 Dependence on renal dialysis: Secondary | ICD-10-CM | POA: Diagnosis not present

## 2021-10-02 DIAGNOSIS — N2581 Secondary hyperparathyroidism of renal origin: Secondary | ICD-10-CM | POA: Diagnosis not present

## 2021-10-02 DIAGNOSIS — D696 Thrombocytopenia, unspecified: Secondary | ICD-10-CM | POA: Diagnosis not present

## 2021-10-02 DIAGNOSIS — D689 Coagulation defect, unspecified: Secondary | ICD-10-CM | POA: Diagnosis not present

## 2021-10-02 DIAGNOSIS — E871 Hypo-osmolality and hyponatremia: Secondary | ICD-10-CM | POA: Diagnosis not present

## 2021-10-05 DIAGNOSIS — E871 Hypo-osmolality and hyponatremia: Secondary | ICD-10-CM | POA: Diagnosis not present

## 2021-10-05 DIAGNOSIS — D696 Thrombocytopenia, unspecified: Secondary | ICD-10-CM | POA: Diagnosis not present

## 2021-10-05 DIAGNOSIS — D631 Anemia in chronic kidney disease: Secondary | ICD-10-CM | POA: Diagnosis not present

## 2021-10-05 DIAGNOSIS — D689 Coagulation defect, unspecified: Secondary | ICD-10-CM | POA: Diagnosis not present

## 2021-10-05 DIAGNOSIS — N186 End stage renal disease: Secondary | ICD-10-CM | POA: Diagnosis not present

## 2021-10-05 DIAGNOSIS — D509 Iron deficiency anemia, unspecified: Secondary | ICD-10-CM | POA: Diagnosis not present

## 2021-10-05 DIAGNOSIS — N2581 Secondary hyperparathyroidism of renal origin: Secondary | ICD-10-CM | POA: Diagnosis not present

## 2021-10-05 DIAGNOSIS — Z992 Dependence on renal dialysis: Secondary | ICD-10-CM | POA: Diagnosis not present

## 2021-10-07 DIAGNOSIS — D631 Anemia in chronic kidney disease: Secondary | ICD-10-CM | POA: Diagnosis not present

## 2021-10-07 DIAGNOSIS — N186 End stage renal disease: Secondary | ICD-10-CM | POA: Diagnosis not present

## 2021-10-07 DIAGNOSIS — D696 Thrombocytopenia, unspecified: Secondary | ICD-10-CM | POA: Diagnosis not present

## 2021-10-07 DIAGNOSIS — N2581 Secondary hyperparathyroidism of renal origin: Secondary | ICD-10-CM | POA: Diagnosis not present

## 2021-10-07 DIAGNOSIS — D689 Coagulation defect, unspecified: Secondary | ICD-10-CM | POA: Diagnosis not present

## 2021-10-07 DIAGNOSIS — Z992 Dependence on renal dialysis: Secondary | ICD-10-CM | POA: Diagnosis not present

## 2021-10-07 DIAGNOSIS — D509 Iron deficiency anemia, unspecified: Secondary | ICD-10-CM | POA: Diagnosis not present

## 2021-10-07 DIAGNOSIS — E871 Hypo-osmolality and hyponatremia: Secondary | ICD-10-CM | POA: Diagnosis not present

## 2021-10-08 DIAGNOSIS — N185 Chronic kidney disease, stage 5: Secondary | ICD-10-CM | POA: Diagnosis not present

## 2021-10-08 DIAGNOSIS — E119 Type 2 diabetes mellitus without complications: Secondary | ICD-10-CM | POA: Diagnosis not present

## 2021-10-08 DIAGNOSIS — J0100 Acute maxillary sinusitis, unspecified: Secondary | ICD-10-CM | POA: Diagnosis not present

## 2021-10-08 DIAGNOSIS — D638 Anemia in other chronic diseases classified elsewhere: Secondary | ICD-10-CM | POA: Diagnosis not present

## 2021-10-08 DIAGNOSIS — F5101 Primary insomnia: Secondary | ICD-10-CM | POA: Diagnosis not present

## 2021-10-08 DIAGNOSIS — I10 Essential (primary) hypertension: Secondary | ICD-10-CM | POA: Diagnosis not present

## 2021-10-09 DIAGNOSIS — D509 Iron deficiency anemia, unspecified: Secondary | ICD-10-CM | POA: Diagnosis not present

## 2021-10-09 DIAGNOSIS — N2581 Secondary hyperparathyroidism of renal origin: Secondary | ICD-10-CM | POA: Diagnosis not present

## 2021-10-09 DIAGNOSIS — D689 Coagulation defect, unspecified: Secondary | ICD-10-CM | POA: Diagnosis not present

## 2021-10-09 DIAGNOSIS — D631 Anemia in chronic kidney disease: Secondary | ICD-10-CM | POA: Diagnosis not present

## 2021-10-09 DIAGNOSIS — Z992 Dependence on renal dialysis: Secondary | ICD-10-CM | POA: Diagnosis not present

## 2021-10-09 DIAGNOSIS — N186 End stage renal disease: Secondary | ICD-10-CM | POA: Diagnosis not present

## 2021-10-09 DIAGNOSIS — D696 Thrombocytopenia, unspecified: Secondary | ICD-10-CM | POA: Diagnosis not present

## 2021-10-09 DIAGNOSIS — E871 Hypo-osmolality and hyponatremia: Secondary | ICD-10-CM | POA: Diagnosis not present

## 2021-10-11 DIAGNOSIS — N186 End stage renal disease: Secondary | ICD-10-CM | POA: Diagnosis not present

## 2021-10-11 DIAGNOSIS — Z992 Dependence on renal dialysis: Secondary | ICD-10-CM | POA: Diagnosis not present

## 2021-10-12 DIAGNOSIS — D689 Coagulation defect, unspecified: Secondary | ICD-10-CM | POA: Diagnosis not present

## 2021-10-12 DIAGNOSIS — N2581 Secondary hyperparathyroidism of renal origin: Secondary | ICD-10-CM | POA: Diagnosis not present

## 2021-10-12 DIAGNOSIS — Z992 Dependence on renal dialysis: Secondary | ICD-10-CM | POA: Diagnosis not present

## 2021-10-12 DIAGNOSIS — N186 End stage renal disease: Secondary | ICD-10-CM | POA: Diagnosis not present

## 2021-10-12 DIAGNOSIS — D696 Thrombocytopenia, unspecified: Secondary | ICD-10-CM | POA: Diagnosis not present

## 2021-10-12 DIAGNOSIS — D509 Iron deficiency anemia, unspecified: Secondary | ICD-10-CM | POA: Diagnosis not present

## 2021-10-14 DIAGNOSIS — D689 Coagulation defect, unspecified: Secondary | ICD-10-CM | POA: Diagnosis not present

## 2021-10-14 DIAGNOSIS — Z992 Dependence on renal dialysis: Secondary | ICD-10-CM | POA: Diagnosis not present

## 2021-10-14 DIAGNOSIS — D696 Thrombocytopenia, unspecified: Secondary | ICD-10-CM | POA: Diagnosis not present

## 2021-10-14 DIAGNOSIS — D509 Iron deficiency anemia, unspecified: Secondary | ICD-10-CM | POA: Diagnosis not present

## 2021-10-14 DIAGNOSIS — N186 End stage renal disease: Secondary | ICD-10-CM | POA: Diagnosis not present

## 2021-10-14 DIAGNOSIS — N2581 Secondary hyperparathyroidism of renal origin: Secondary | ICD-10-CM | POA: Diagnosis not present

## 2021-10-16 DIAGNOSIS — D696 Thrombocytopenia, unspecified: Secondary | ICD-10-CM | POA: Diagnosis not present

## 2021-10-16 DIAGNOSIS — N186 End stage renal disease: Secondary | ICD-10-CM | POA: Diagnosis not present

## 2021-10-16 DIAGNOSIS — N2581 Secondary hyperparathyroidism of renal origin: Secondary | ICD-10-CM | POA: Diagnosis not present

## 2021-10-16 DIAGNOSIS — D509 Iron deficiency anemia, unspecified: Secondary | ICD-10-CM | POA: Diagnosis not present

## 2021-10-16 DIAGNOSIS — Z992 Dependence on renal dialysis: Secondary | ICD-10-CM | POA: Diagnosis not present

## 2021-10-16 DIAGNOSIS — D689 Coagulation defect, unspecified: Secondary | ICD-10-CM | POA: Diagnosis not present

## 2021-10-19 DIAGNOSIS — Z992 Dependence on renal dialysis: Secondary | ICD-10-CM | POA: Diagnosis not present

## 2021-10-19 DIAGNOSIS — D509 Iron deficiency anemia, unspecified: Secondary | ICD-10-CM | POA: Diagnosis not present

## 2021-10-19 DIAGNOSIS — D689 Coagulation defect, unspecified: Secondary | ICD-10-CM | POA: Diagnosis not present

## 2021-10-19 DIAGNOSIS — N2581 Secondary hyperparathyroidism of renal origin: Secondary | ICD-10-CM | POA: Diagnosis not present

## 2021-10-19 DIAGNOSIS — N186 End stage renal disease: Secondary | ICD-10-CM | POA: Diagnosis not present

## 2021-10-19 DIAGNOSIS — D696 Thrombocytopenia, unspecified: Secondary | ICD-10-CM | POA: Diagnosis not present

## 2021-10-21 DIAGNOSIS — D696 Thrombocytopenia, unspecified: Secondary | ICD-10-CM | POA: Diagnosis not present

## 2021-10-21 DIAGNOSIS — Z992 Dependence on renal dialysis: Secondary | ICD-10-CM | POA: Diagnosis not present

## 2021-10-21 DIAGNOSIS — N2581 Secondary hyperparathyroidism of renal origin: Secondary | ICD-10-CM | POA: Diagnosis not present

## 2021-10-21 DIAGNOSIS — D509 Iron deficiency anemia, unspecified: Secondary | ICD-10-CM | POA: Diagnosis not present

## 2021-10-21 DIAGNOSIS — D689 Coagulation defect, unspecified: Secondary | ICD-10-CM | POA: Diagnosis not present

## 2021-10-21 DIAGNOSIS — N186 End stage renal disease: Secondary | ICD-10-CM | POA: Diagnosis not present

## 2021-10-23 DIAGNOSIS — N2581 Secondary hyperparathyroidism of renal origin: Secondary | ICD-10-CM | POA: Diagnosis not present

## 2021-10-23 DIAGNOSIS — D509 Iron deficiency anemia, unspecified: Secondary | ICD-10-CM | POA: Diagnosis not present

## 2021-10-23 DIAGNOSIS — D696 Thrombocytopenia, unspecified: Secondary | ICD-10-CM | POA: Diagnosis not present

## 2021-10-23 DIAGNOSIS — D689 Coagulation defect, unspecified: Secondary | ICD-10-CM | POA: Diagnosis not present

## 2021-10-23 DIAGNOSIS — N186 End stage renal disease: Secondary | ICD-10-CM | POA: Diagnosis not present

## 2021-10-23 DIAGNOSIS — Z992 Dependence on renal dialysis: Secondary | ICD-10-CM | POA: Diagnosis not present

## 2021-10-26 DIAGNOSIS — N186 End stage renal disease: Secondary | ICD-10-CM | POA: Diagnosis not present

## 2021-10-26 DIAGNOSIS — D689 Coagulation defect, unspecified: Secondary | ICD-10-CM | POA: Diagnosis not present

## 2021-10-26 DIAGNOSIS — Z992 Dependence on renal dialysis: Secondary | ICD-10-CM | POA: Diagnosis not present

## 2021-10-26 DIAGNOSIS — D696 Thrombocytopenia, unspecified: Secondary | ICD-10-CM | POA: Diagnosis not present

## 2021-10-26 DIAGNOSIS — N2581 Secondary hyperparathyroidism of renal origin: Secondary | ICD-10-CM | POA: Diagnosis not present

## 2021-10-26 DIAGNOSIS — D509 Iron deficiency anemia, unspecified: Secondary | ICD-10-CM | POA: Diagnosis not present

## 2021-10-28 DIAGNOSIS — D509 Iron deficiency anemia, unspecified: Secondary | ICD-10-CM | POA: Diagnosis not present

## 2021-10-28 DIAGNOSIS — N2581 Secondary hyperparathyroidism of renal origin: Secondary | ICD-10-CM | POA: Diagnosis not present

## 2021-10-28 DIAGNOSIS — D696 Thrombocytopenia, unspecified: Secondary | ICD-10-CM | POA: Diagnosis not present

## 2021-10-28 DIAGNOSIS — N186 End stage renal disease: Secondary | ICD-10-CM | POA: Diagnosis not present

## 2021-10-28 DIAGNOSIS — D689 Coagulation defect, unspecified: Secondary | ICD-10-CM | POA: Diagnosis not present

## 2021-10-28 DIAGNOSIS — Z992 Dependence on renal dialysis: Secondary | ICD-10-CM | POA: Diagnosis not present

## 2021-10-30 DIAGNOSIS — D696 Thrombocytopenia, unspecified: Secondary | ICD-10-CM | POA: Diagnosis not present

## 2021-10-30 DIAGNOSIS — D509 Iron deficiency anemia, unspecified: Secondary | ICD-10-CM | POA: Diagnosis not present

## 2021-10-30 DIAGNOSIS — D689 Coagulation defect, unspecified: Secondary | ICD-10-CM | POA: Diagnosis not present

## 2021-10-30 DIAGNOSIS — N2581 Secondary hyperparathyroidism of renal origin: Secondary | ICD-10-CM | POA: Diagnosis not present

## 2021-10-30 DIAGNOSIS — N186 End stage renal disease: Secondary | ICD-10-CM | POA: Diagnosis not present

## 2021-10-30 DIAGNOSIS — Z992 Dependence on renal dialysis: Secondary | ICD-10-CM | POA: Diagnosis not present

## 2021-11-02 DIAGNOSIS — D689 Coagulation defect, unspecified: Secondary | ICD-10-CM | POA: Diagnosis not present

## 2021-11-02 DIAGNOSIS — N2581 Secondary hyperparathyroidism of renal origin: Secondary | ICD-10-CM | POA: Diagnosis not present

## 2021-11-02 DIAGNOSIS — D509 Iron deficiency anemia, unspecified: Secondary | ICD-10-CM | POA: Diagnosis not present

## 2021-11-02 DIAGNOSIS — N186 End stage renal disease: Secondary | ICD-10-CM | POA: Diagnosis not present

## 2021-11-02 DIAGNOSIS — Z992 Dependence on renal dialysis: Secondary | ICD-10-CM | POA: Diagnosis not present

## 2021-11-02 DIAGNOSIS — D696 Thrombocytopenia, unspecified: Secondary | ICD-10-CM | POA: Diagnosis not present

## 2021-11-04 DIAGNOSIS — D509 Iron deficiency anemia, unspecified: Secondary | ICD-10-CM | POA: Diagnosis not present

## 2021-11-04 DIAGNOSIS — D689 Coagulation defect, unspecified: Secondary | ICD-10-CM | POA: Diagnosis not present

## 2021-11-04 DIAGNOSIS — Z992 Dependence on renal dialysis: Secondary | ICD-10-CM | POA: Diagnosis not present

## 2021-11-04 DIAGNOSIS — D696 Thrombocytopenia, unspecified: Secondary | ICD-10-CM | POA: Diagnosis not present

## 2021-11-04 DIAGNOSIS — N186 End stage renal disease: Secondary | ICD-10-CM | POA: Diagnosis not present

## 2021-11-04 DIAGNOSIS — N2581 Secondary hyperparathyroidism of renal origin: Secondary | ICD-10-CM | POA: Diagnosis not present

## 2021-11-06 DIAGNOSIS — D696 Thrombocytopenia, unspecified: Secondary | ICD-10-CM | POA: Diagnosis not present

## 2021-11-06 DIAGNOSIS — D509 Iron deficiency anemia, unspecified: Secondary | ICD-10-CM | POA: Diagnosis not present

## 2021-11-06 DIAGNOSIS — N2581 Secondary hyperparathyroidism of renal origin: Secondary | ICD-10-CM | POA: Diagnosis not present

## 2021-11-06 DIAGNOSIS — D689 Coagulation defect, unspecified: Secondary | ICD-10-CM | POA: Diagnosis not present

## 2021-11-06 DIAGNOSIS — N186 End stage renal disease: Secondary | ICD-10-CM | POA: Diagnosis not present

## 2021-11-06 DIAGNOSIS — Z992 Dependence on renal dialysis: Secondary | ICD-10-CM | POA: Diagnosis not present

## 2021-11-09 DIAGNOSIS — Z992 Dependence on renal dialysis: Secondary | ICD-10-CM | POA: Diagnosis not present

## 2021-11-09 DIAGNOSIS — D509 Iron deficiency anemia, unspecified: Secondary | ICD-10-CM | POA: Diagnosis not present

## 2021-11-09 DIAGNOSIS — N186 End stage renal disease: Secondary | ICD-10-CM | POA: Diagnosis not present

## 2021-11-09 DIAGNOSIS — D696 Thrombocytopenia, unspecified: Secondary | ICD-10-CM | POA: Diagnosis not present

## 2021-11-09 DIAGNOSIS — N2581 Secondary hyperparathyroidism of renal origin: Secondary | ICD-10-CM | POA: Diagnosis not present

## 2021-11-09 DIAGNOSIS — D689 Coagulation defect, unspecified: Secondary | ICD-10-CM | POA: Diagnosis not present

## 2021-11-11 DIAGNOSIS — D689 Coagulation defect, unspecified: Secondary | ICD-10-CM | POA: Diagnosis not present

## 2021-11-11 DIAGNOSIS — D509 Iron deficiency anemia, unspecified: Secondary | ICD-10-CM | POA: Diagnosis not present

## 2021-11-11 DIAGNOSIS — Z992 Dependence on renal dialysis: Secondary | ICD-10-CM | POA: Diagnosis not present

## 2021-11-11 DIAGNOSIS — N2581 Secondary hyperparathyroidism of renal origin: Secondary | ICD-10-CM | POA: Diagnosis not present

## 2021-11-11 DIAGNOSIS — N186 End stage renal disease: Secondary | ICD-10-CM | POA: Diagnosis not present

## 2021-11-11 DIAGNOSIS — D696 Thrombocytopenia, unspecified: Secondary | ICD-10-CM | POA: Diagnosis not present

## 2021-11-13 DIAGNOSIS — N186 End stage renal disease: Secondary | ICD-10-CM | POA: Diagnosis not present

## 2021-11-13 DIAGNOSIS — D509 Iron deficiency anemia, unspecified: Secondary | ICD-10-CM | POA: Diagnosis not present

## 2021-11-13 DIAGNOSIS — Z992 Dependence on renal dialysis: Secondary | ICD-10-CM | POA: Diagnosis not present

## 2021-11-13 DIAGNOSIS — E871 Hypo-osmolality and hyponatremia: Secondary | ICD-10-CM | POA: Diagnosis not present

## 2021-11-13 DIAGNOSIS — D696 Thrombocytopenia, unspecified: Secondary | ICD-10-CM | POA: Diagnosis not present

## 2021-11-13 DIAGNOSIS — N2581 Secondary hyperparathyroidism of renal origin: Secondary | ICD-10-CM | POA: Diagnosis not present

## 2021-11-13 DIAGNOSIS — D689 Coagulation defect, unspecified: Secondary | ICD-10-CM | POA: Diagnosis not present

## 2021-11-16 DIAGNOSIS — D696 Thrombocytopenia, unspecified: Secondary | ICD-10-CM | POA: Diagnosis not present

## 2021-11-16 DIAGNOSIS — E871 Hypo-osmolality and hyponatremia: Secondary | ICD-10-CM | POA: Diagnosis not present

## 2021-11-16 DIAGNOSIS — D689 Coagulation defect, unspecified: Secondary | ICD-10-CM | POA: Diagnosis not present

## 2021-11-16 DIAGNOSIS — N2581 Secondary hyperparathyroidism of renal origin: Secondary | ICD-10-CM | POA: Diagnosis not present

## 2021-11-16 DIAGNOSIS — D509 Iron deficiency anemia, unspecified: Secondary | ICD-10-CM | POA: Diagnosis not present

## 2021-11-16 DIAGNOSIS — N186 End stage renal disease: Secondary | ICD-10-CM | POA: Diagnosis not present

## 2021-11-16 DIAGNOSIS — Z992 Dependence on renal dialysis: Secondary | ICD-10-CM | POA: Diagnosis not present

## 2021-11-18 DIAGNOSIS — D696 Thrombocytopenia, unspecified: Secondary | ICD-10-CM | POA: Diagnosis not present

## 2021-11-18 DIAGNOSIS — Z992 Dependence on renal dialysis: Secondary | ICD-10-CM | POA: Diagnosis not present

## 2021-11-18 DIAGNOSIS — D689 Coagulation defect, unspecified: Secondary | ICD-10-CM | POA: Diagnosis not present

## 2021-11-18 DIAGNOSIS — N2581 Secondary hyperparathyroidism of renal origin: Secondary | ICD-10-CM | POA: Diagnosis not present

## 2021-11-18 DIAGNOSIS — D509 Iron deficiency anemia, unspecified: Secondary | ICD-10-CM | POA: Diagnosis not present

## 2021-11-18 DIAGNOSIS — E871 Hypo-osmolality and hyponatremia: Secondary | ICD-10-CM | POA: Diagnosis not present

## 2021-11-18 DIAGNOSIS — N186 End stage renal disease: Secondary | ICD-10-CM | POA: Diagnosis not present

## 2021-11-20 DIAGNOSIS — D696 Thrombocytopenia, unspecified: Secondary | ICD-10-CM | POA: Diagnosis not present

## 2021-11-20 DIAGNOSIS — N2581 Secondary hyperparathyroidism of renal origin: Secondary | ICD-10-CM | POA: Diagnosis not present

## 2021-11-20 DIAGNOSIS — D689 Coagulation defect, unspecified: Secondary | ICD-10-CM | POA: Diagnosis not present

## 2021-11-20 DIAGNOSIS — D509 Iron deficiency anemia, unspecified: Secondary | ICD-10-CM | POA: Diagnosis not present

## 2021-11-20 DIAGNOSIS — E871 Hypo-osmolality and hyponatremia: Secondary | ICD-10-CM | POA: Diagnosis not present

## 2021-11-20 DIAGNOSIS — N186 End stage renal disease: Secondary | ICD-10-CM | POA: Diagnosis not present

## 2021-11-20 DIAGNOSIS — Z992 Dependence on renal dialysis: Secondary | ICD-10-CM | POA: Diagnosis not present

## 2021-11-23 DIAGNOSIS — D696 Thrombocytopenia, unspecified: Secondary | ICD-10-CM | POA: Diagnosis not present

## 2021-11-23 DIAGNOSIS — D689 Coagulation defect, unspecified: Secondary | ICD-10-CM | POA: Diagnosis not present

## 2021-11-23 DIAGNOSIS — E871 Hypo-osmolality and hyponatremia: Secondary | ICD-10-CM | POA: Diagnosis not present

## 2021-11-23 DIAGNOSIS — N186 End stage renal disease: Secondary | ICD-10-CM | POA: Diagnosis not present

## 2021-11-23 DIAGNOSIS — D509 Iron deficiency anemia, unspecified: Secondary | ICD-10-CM | POA: Diagnosis not present

## 2021-11-23 DIAGNOSIS — N2581 Secondary hyperparathyroidism of renal origin: Secondary | ICD-10-CM | POA: Diagnosis not present

## 2021-11-23 DIAGNOSIS — Z992 Dependence on renal dialysis: Secondary | ICD-10-CM | POA: Diagnosis not present

## 2021-11-25 DIAGNOSIS — Z992 Dependence on renal dialysis: Secondary | ICD-10-CM | POA: Diagnosis not present

## 2021-11-25 DIAGNOSIS — D509 Iron deficiency anemia, unspecified: Secondary | ICD-10-CM | POA: Diagnosis not present

## 2021-11-25 DIAGNOSIS — N2581 Secondary hyperparathyroidism of renal origin: Secondary | ICD-10-CM | POA: Diagnosis not present

## 2021-11-25 DIAGNOSIS — E871 Hypo-osmolality and hyponatremia: Secondary | ICD-10-CM | POA: Diagnosis not present

## 2021-11-25 DIAGNOSIS — D689 Coagulation defect, unspecified: Secondary | ICD-10-CM | POA: Diagnosis not present

## 2021-11-25 DIAGNOSIS — D696 Thrombocytopenia, unspecified: Secondary | ICD-10-CM | POA: Diagnosis not present

## 2021-11-25 DIAGNOSIS — N186 End stage renal disease: Secondary | ICD-10-CM | POA: Diagnosis not present

## 2021-11-27 DIAGNOSIS — D696 Thrombocytopenia, unspecified: Secondary | ICD-10-CM | POA: Diagnosis not present

## 2021-11-27 DIAGNOSIS — N186 End stage renal disease: Secondary | ICD-10-CM | POA: Diagnosis not present

## 2021-11-27 DIAGNOSIS — D689 Coagulation defect, unspecified: Secondary | ICD-10-CM | POA: Diagnosis not present

## 2021-11-27 DIAGNOSIS — E871 Hypo-osmolality and hyponatremia: Secondary | ICD-10-CM | POA: Diagnosis not present

## 2021-11-27 DIAGNOSIS — N2581 Secondary hyperparathyroidism of renal origin: Secondary | ICD-10-CM | POA: Diagnosis not present

## 2021-11-27 DIAGNOSIS — D509 Iron deficiency anemia, unspecified: Secondary | ICD-10-CM | POA: Diagnosis not present

## 2021-11-27 DIAGNOSIS — Z992 Dependence on renal dialysis: Secondary | ICD-10-CM | POA: Diagnosis not present

## 2021-11-30 DIAGNOSIS — E871 Hypo-osmolality and hyponatremia: Secondary | ICD-10-CM | POA: Diagnosis not present

## 2021-11-30 DIAGNOSIS — Z992 Dependence on renal dialysis: Secondary | ICD-10-CM | POA: Diagnosis not present

## 2021-11-30 DIAGNOSIS — H6120 Impacted cerumen, unspecified ear: Secondary | ICD-10-CM | POA: Diagnosis not present

## 2021-11-30 DIAGNOSIS — D509 Iron deficiency anemia, unspecified: Secondary | ICD-10-CM | POA: Diagnosis not present

## 2021-11-30 DIAGNOSIS — D696 Thrombocytopenia, unspecified: Secondary | ICD-10-CM | POA: Diagnosis not present

## 2021-11-30 DIAGNOSIS — N2581 Secondary hyperparathyroidism of renal origin: Secondary | ICD-10-CM | POA: Diagnosis not present

## 2021-11-30 DIAGNOSIS — D689 Coagulation defect, unspecified: Secondary | ICD-10-CM | POA: Diagnosis not present

## 2021-11-30 DIAGNOSIS — N186 End stage renal disease: Secondary | ICD-10-CM | POA: Diagnosis not present

## 2021-12-02 DIAGNOSIS — D696 Thrombocytopenia, unspecified: Secondary | ICD-10-CM | POA: Diagnosis not present

## 2021-12-02 DIAGNOSIS — E871 Hypo-osmolality and hyponatremia: Secondary | ICD-10-CM | POA: Diagnosis not present

## 2021-12-02 DIAGNOSIS — N186 End stage renal disease: Secondary | ICD-10-CM | POA: Diagnosis not present

## 2021-12-02 DIAGNOSIS — D689 Coagulation defect, unspecified: Secondary | ICD-10-CM | POA: Diagnosis not present

## 2021-12-02 DIAGNOSIS — D509 Iron deficiency anemia, unspecified: Secondary | ICD-10-CM | POA: Diagnosis not present

## 2021-12-02 DIAGNOSIS — N2581 Secondary hyperparathyroidism of renal origin: Secondary | ICD-10-CM | POA: Diagnosis not present

## 2021-12-02 DIAGNOSIS — Z992 Dependence on renal dialysis: Secondary | ICD-10-CM | POA: Diagnosis not present

## 2021-12-04 DIAGNOSIS — N2581 Secondary hyperparathyroidism of renal origin: Secondary | ICD-10-CM | POA: Diagnosis not present

## 2021-12-04 DIAGNOSIS — D696 Thrombocytopenia, unspecified: Secondary | ICD-10-CM | POA: Diagnosis not present

## 2021-12-04 DIAGNOSIS — N186 End stage renal disease: Secondary | ICD-10-CM | POA: Diagnosis not present

## 2021-12-04 DIAGNOSIS — D509 Iron deficiency anemia, unspecified: Secondary | ICD-10-CM | POA: Diagnosis not present

## 2021-12-04 DIAGNOSIS — E871 Hypo-osmolality and hyponatremia: Secondary | ICD-10-CM | POA: Diagnosis not present

## 2021-12-04 DIAGNOSIS — D689 Coagulation defect, unspecified: Secondary | ICD-10-CM | POA: Diagnosis not present

## 2021-12-04 DIAGNOSIS — Z992 Dependence on renal dialysis: Secondary | ICD-10-CM | POA: Diagnosis not present

## 2021-12-07 DIAGNOSIS — D696 Thrombocytopenia, unspecified: Secondary | ICD-10-CM | POA: Diagnosis not present

## 2021-12-07 DIAGNOSIS — D689 Coagulation defect, unspecified: Secondary | ICD-10-CM | POA: Diagnosis not present

## 2021-12-07 DIAGNOSIS — D509 Iron deficiency anemia, unspecified: Secondary | ICD-10-CM | POA: Diagnosis not present

## 2021-12-07 DIAGNOSIS — E871 Hypo-osmolality and hyponatremia: Secondary | ICD-10-CM | POA: Diagnosis not present

## 2021-12-07 DIAGNOSIS — N2581 Secondary hyperparathyroidism of renal origin: Secondary | ICD-10-CM | POA: Diagnosis not present

## 2021-12-07 DIAGNOSIS — Z992 Dependence on renal dialysis: Secondary | ICD-10-CM | POA: Diagnosis not present

## 2021-12-07 DIAGNOSIS — N186 End stage renal disease: Secondary | ICD-10-CM | POA: Diagnosis not present

## 2021-12-09 DIAGNOSIS — D696 Thrombocytopenia, unspecified: Secondary | ICD-10-CM | POA: Diagnosis not present

## 2021-12-09 DIAGNOSIS — D689 Coagulation defect, unspecified: Secondary | ICD-10-CM | POA: Diagnosis not present

## 2021-12-09 DIAGNOSIS — N2581 Secondary hyperparathyroidism of renal origin: Secondary | ICD-10-CM | POA: Diagnosis not present

## 2021-12-09 DIAGNOSIS — N186 End stage renal disease: Secondary | ICD-10-CM | POA: Diagnosis not present

## 2021-12-09 DIAGNOSIS — E871 Hypo-osmolality and hyponatremia: Secondary | ICD-10-CM | POA: Diagnosis not present

## 2021-12-09 DIAGNOSIS — D509 Iron deficiency anemia, unspecified: Secondary | ICD-10-CM | POA: Diagnosis not present

## 2021-12-09 DIAGNOSIS — Z992 Dependence on renal dialysis: Secondary | ICD-10-CM | POA: Diagnosis not present

## 2021-12-11 DIAGNOSIS — N2581 Secondary hyperparathyroidism of renal origin: Secondary | ICD-10-CM | POA: Diagnosis not present

## 2021-12-11 DIAGNOSIS — N186 End stage renal disease: Secondary | ICD-10-CM | POA: Diagnosis not present

## 2021-12-11 DIAGNOSIS — D696 Thrombocytopenia, unspecified: Secondary | ICD-10-CM | POA: Diagnosis not present

## 2021-12-11 DIAGNOSIS — D689 Coagulation defect, unspecified: Secondary | ICD-10-CM | POA: Diagnosis not present

## 2021-12-11 DIAGNOSIS — D509 Iron deficiency anemia, unspecified: Secondary | ICD-10-CM | POA: Diagnosis not present

## 2021-12-11 DIAGNOSIS — Z992 Dependence on renal dialysis: Secondary | ICD-10-CM | POA: Diagnosis not present

## 2021-12-11 DIAGNOSIS — E871 Hypo-osmolality and hyponatremia: Secondary | ICD-10-CM | POA: Diagnosis not present

## 2021-12-14 DIAGNOSIS — D689 Coagulation defect, unspecified: Secondary | ICD-10-CM | POA: Diagnosis not present

## 2021-12-14 DIAGNOSIS — D696 Thrombocytopenia, unspecified: Secondary | ICD-10-CM | POA: Diagnosis not present

## 2021-12-14 DIAGNOSIS — N186 End stage renal disease: Secondary | ICD-10-CM | POA: Diagnosis not present

## 2021-12-14 DIAGNOSIS — D509 Iron deficiency anemia, unspecified: Secondary | ICD-10-CM | POA: Diagnosis not present

## 2021-12-14 DIAGNOSIS — D631 Anemia in chronic kidney disease: Secondary | ICD-10-CM | POA: Diagnosis not present

## 2021-12-14 DIAGNOSIS — N2581 Secondary hyperparathyroidism of renal origin: Secondary | ICD-10-CM | POA: Diagnosis not present

## 2021-12-14 DIAGNOSIS — Z992 Dependence on renal dialysis: Secondary | ICD-10-CM | POA: Diagnosis not present

## 2021-12-16 DIAGNOSIS — N186 End stage renal disease: Secondary | ICD-10-CM | POA: Diagnosis not present

## 2021-12-16 DIAGNOSIS — D631 Anemia in chronic kidney disease: Secondary | ICD-10-CM | POA: Diagnosis not present

## 2021-12-16 DIAGNOSIS — D509 Iron deficiency anemia, unspecified: Secondary | ICD-10-CM | POA: Diagnosis not present

## 2021-12-16 DIAGNOSIS — D689 Coagulation defect, unspecified: Secondary | ICD-10-CM | POA: Diagnosis not present

## 2021-12-16 DIAGNOSIS — Z992 Dependence on renal dialysis: Secondary | ICD-10-CM | POA: Diagnosis not present

## 2021-12-16 DIAGNOSIS — D696 Thrombocytopenia, unspecified: Secondary | ICD-10-CM | POA: Diagnosis not present

## 2021-12-16 DIAGNOSIS — N2581 Secondary hyperparathyroidism of renal origin: Secondary | ICD-10-CM | POA: Diagnosis not present

## 2021-12-18 DIAGNOSIS — D509 Iron deficiency anemia, unspecified: Secondary | ICD-10-CM | POA: Diagnosis not present

## 2021-12-18 DIAGNOSIS — Z992 Dependence on renal dialysis: Secondary | ICD-10-CM | POA: Diagnosis not present

## 2021-12-18 DIAGNOSIS — N2581 Secondary hyperparathyroidism of renal origin: Secondary | ICD-10-CM | POA: Diagnosis not present

## 2021-12-18 DIAGNOSIS — D696 Thrombocytopenia, unspecified: Secondary | ICD-10-CM | POA: Diagnosis not present

## 2021-12-18 DIAGNOSIS — D689 Coagulation defect, unspecified: Secondary | ICD-10-CM | POA: Diagnosis not present

## 2021-12-18 DIAGNOSIS — D631 Anemia in chronic kidney disease: Secondary | ICD-10-CM | POA: Diagnosis not present

## 2021-12-18 DIAGNOSIS — N186 End stage renal disease: Secondary | ICD-10-CM | POA: Diagnosis not present

## 2021-12-21 DIAGNOSIS — N2581 Secondary hyperparathyroidism of renal origin: Secondary | ICD-10-CM | POA: Diagnosis not present

## 2021-12-21 DIAGNOSIS — D689 Coagulation defect, unspecified: Secondary | ICD-10-CM | POA: Diagnosis not present

## 2021-12-21 DIAGNOSIS — D631 Anemia in chronic kidney disease: Secondary | ICD-10-CM | POA: Diagnosis not present

## 2021-12-21 DIAGNOSIS — N186 End stage renal disease: Secondary | ICD-10-CM | POA: Diagnosis not present

## 2021-12-21 DIAGNOSIS — Z992 Dependence on renal dialysis: Secondary | ICD-10-CM | POA: Diagnosis not present

## 2021-12-21 DIAGNOSIS — D509 Iron deficiency anemia, unspecified: Secondary | ICD-10-CM | POA: Diagnosis not present

## 2021-12-21 DIAGNOSIS — D696 Thrombocytopenia, unspecified: Secondary | ICD-10-CM | POA: Diagnosis not present

## 2021-12-23 DIAGNOSIS — D696 Thrombocytopenia, unspecified: Secondary | ICD-10-CM | POA: Diagnosis not present

## 2021-12-23 DIAGNOSIS — D509 Iron deficiency anemia, unspecified: Secondary | ICD-10-CM | POA: Diagnosis not present

## 2021-12-23 DIAGNOSIS — N2581 Secondary hyperparathyroidism of renal origin: Secondary | ICD-10-CM | POA: Diagnosis not present

## 2021-12-23 DIAGNOSIS — D631 Anemia in chronic kidney disease: Secondary | ICD-10-CM | POA: Diagnosis not present

## 2021-12-23 DIAGNOSIS — D689 Coagulation defect, unspecified: Secondary | ICD-10-CM | POA: Diagnosis not present

## 2021-12-23 DIAGNOSIS — N186 End stage renal disease: Secondary | ICD-10-CM | POA: Diagnosis not present

## 2021-12-23 DIAGNOSIS — Z992 Dependence on renal dialysis: Secondary | ICD-10-CM | POA: Diagnosis not present

## 2021-12-25 DIAGNOSIS — D509 Iron deficiency anemia, unspecified: Secondary | ICD-10-CM | POA: Diagnosis not present

## 2021-12-25 DIAGNOSIS — N186 End stage renal disease: Secondary | ICD-10-CM | POA: Diagnosis not present

## 2021-12-25 DIAGNOSIS — D631 Anemia in chronic kidney disease: Secondary | ICD-10-CM | POA: Diagnosis not present

## 2021-12-25 DIAGNOSIS — D689 Coagulation defect, unspecified: Secondary | ICD-10-CM | POA: Diagnosis not present

## 2021-12-25 DIAGNOSIS — D696 Thrombocytopenia, unspecified: Secondary | ICD-10-CM | POA: Diagnosis not present

## 2021-12-25 DIAGNOSIS — Z992 Dependence on renal dialysis: Secondary | ICD-10-CM | POA: Diagnosis not present

## 2021-12-25 DIAGNOSIS — N2581 Secondary hyperparathyroidism of renal origin: Secondary | ICD-10-CM | POA: Diagnosis not present

## 2021-12-26 IMAGING — CR DG ABDOMEN ACUTE W/ 1V CHEST
3 series · 3 of 3 positions shown · non-contrast
Comparison: Abdominopelvic CT 2 days ago 12/12/2020. Most recent
chest radiograph 12/04/2020

CLINICAL DATA: Abdominal pain.  Pleuritic chest pain.

EXAM:
DG ABDOMEN ACUTE WITH 1 VIEW CHEST

[abdomen erect]
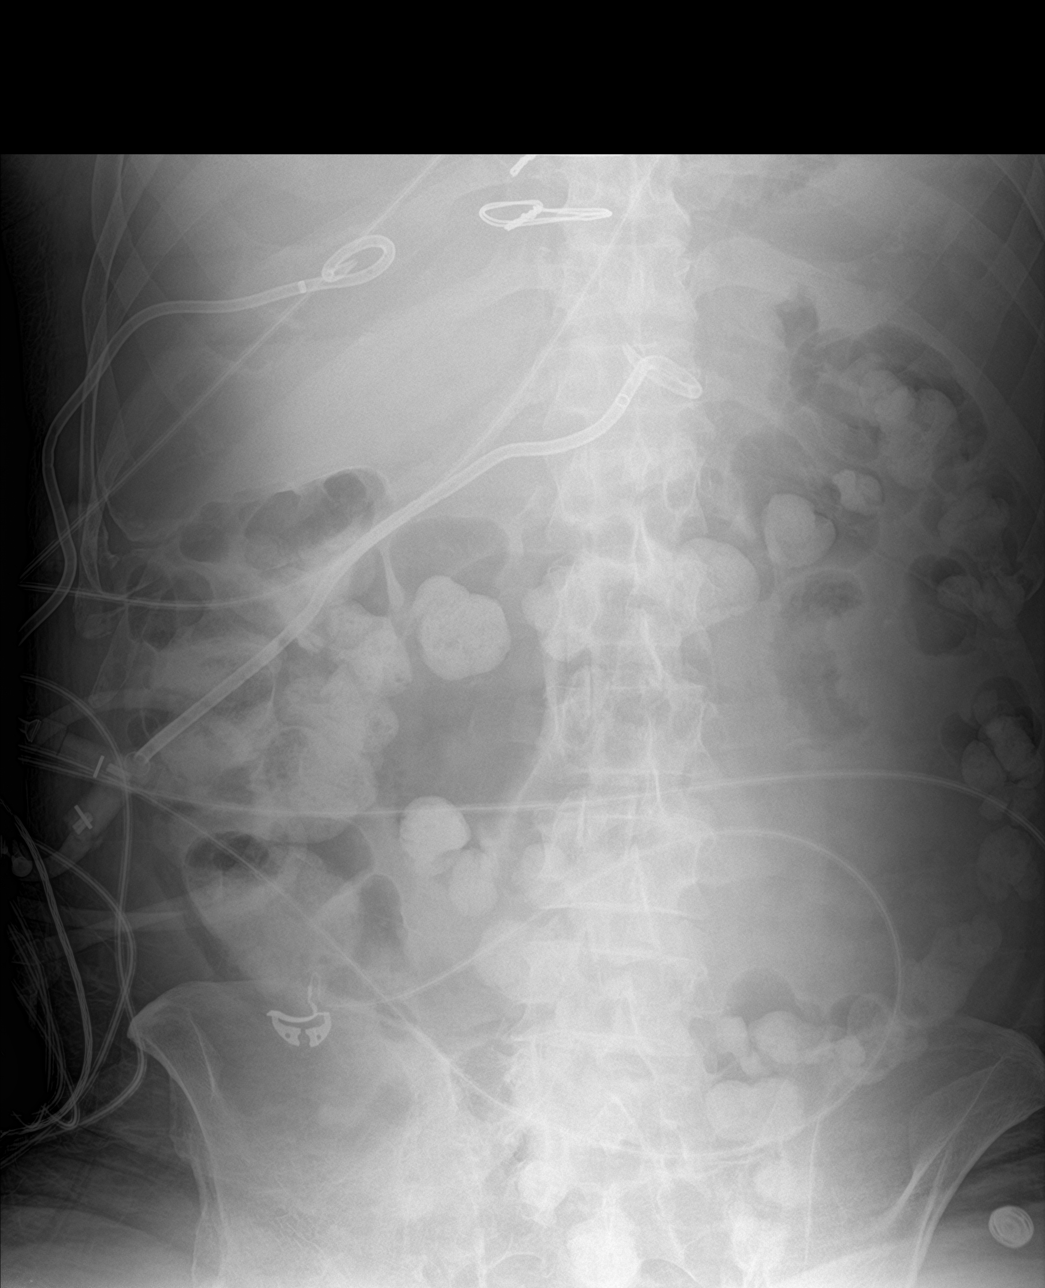

[abdomen supine]
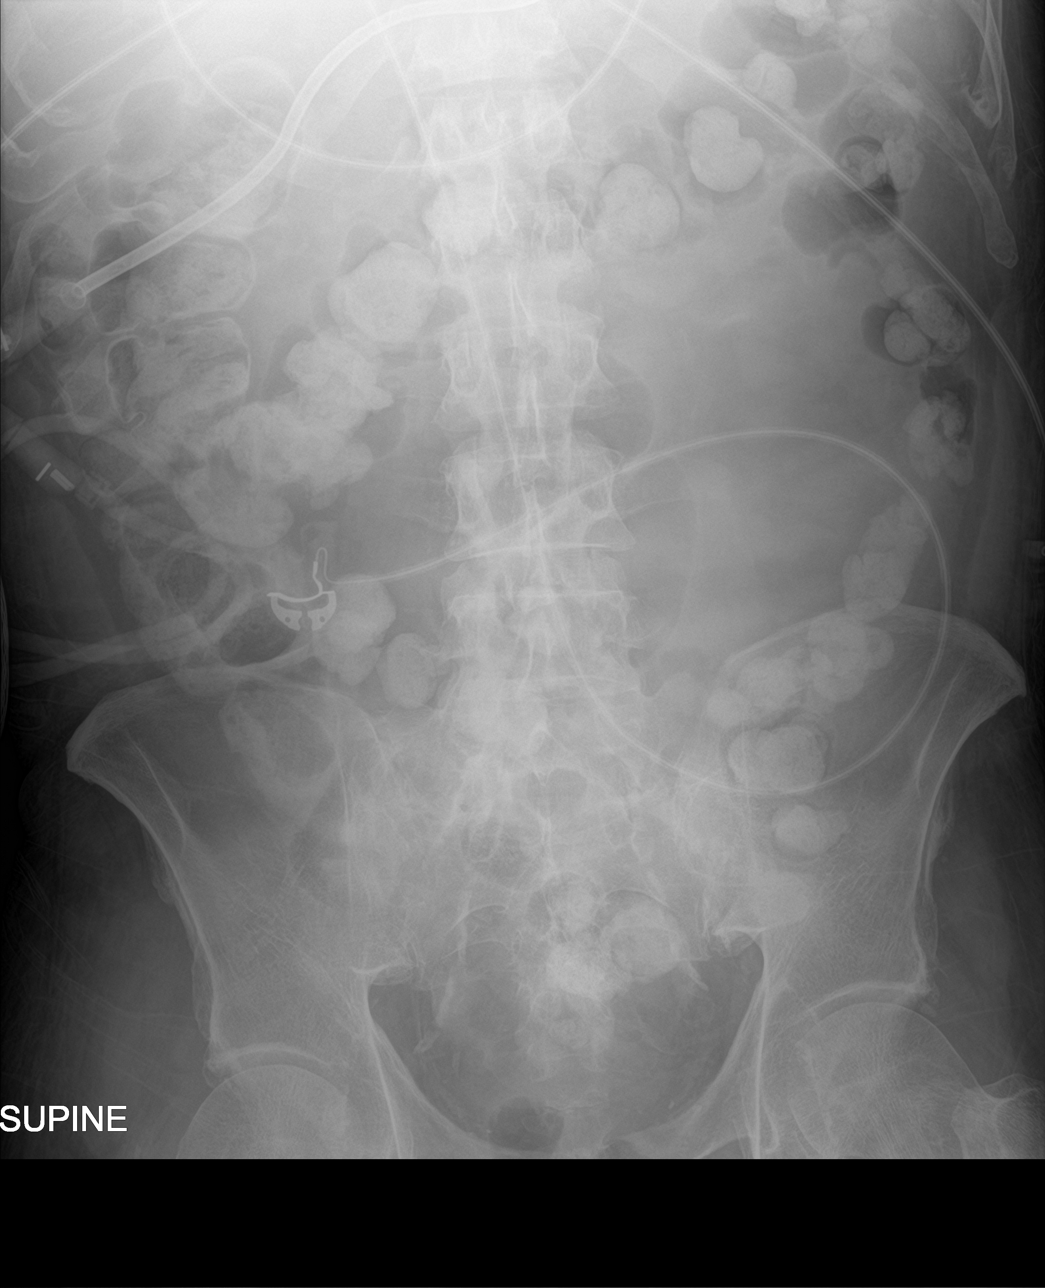

[chest ap]
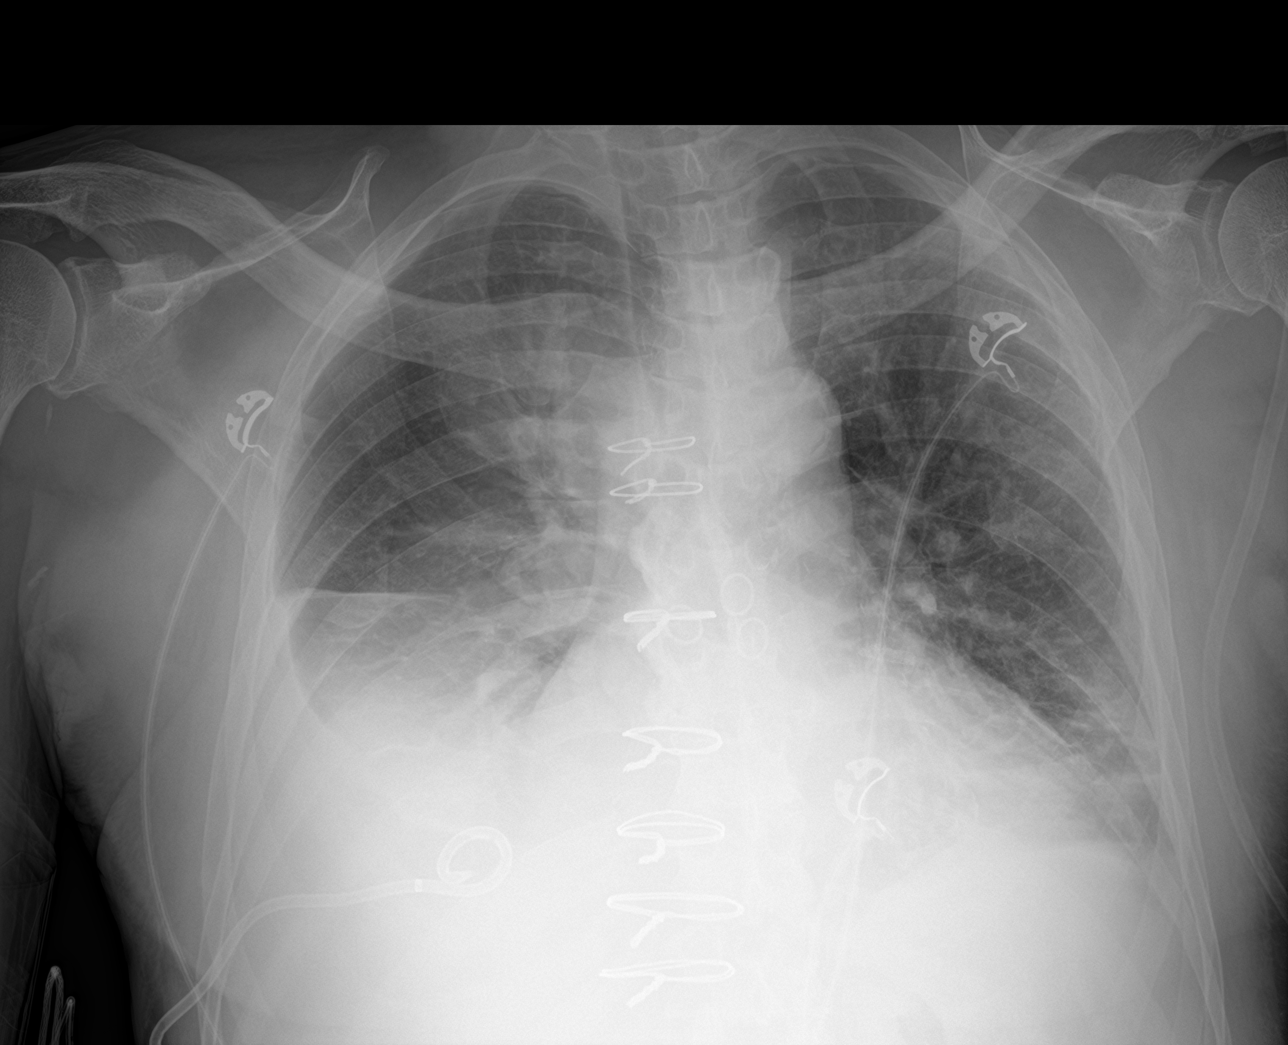

[3 of 3 positions shown; findings below may reference images not displayed]

FINDINGS: Right pleural effusion and basilar atelectasis, grossly unchanged
from recent CT allowing for differences in technique. Small left
pleural effusion and basilar atelectasis. Patient is post median
sternotomy. Unchanged cardiomegaly. No pneumothorax.

Right upper quadrant drain not significantly changed in appearance
from recent CT, located within within the liver. Midline catheter
also unchanged in position, located in the liver on CT. There is no
free intra-abdominal air. Enteric contrast within stool throughout
the colon, no obstruction. Average volume of colonic stool.

Stable osseous structures.
IMPRESSION: 1. Right greater than left pleural effusion and bibasilar
atelectasis, grossly stable from recent imaging.
2. Two drainage catheters in the upper abdomen, located within
hepatic collections on recent CT. Normal bowel gas pattern.

## 2021-12-28 DIAGNOSIS — D631 Anemia in chronic kidney disease: Secondary | ICD-10-CM | POA: Diagnosis not present

## 2021-12-28 DIAGNOSIS — N186 End stage renal disease: Secondary | ICD-10-CM | POA: Diagnosis not present

## 2021-12-28 DIAGNOSIS — N2581 Secondary hyperparathyroidism of renal origin: Secondary | ICD-10-CM | POA: Diagnosis not present

## 2021-12-28 DIAGNOSIS — D696 Thrombocytopenia, unspecified: Secondary | ICD-10-CM | POA: Diagnosis not present

## 2021-12-28 DIAGNOSIS — Z992 Dependence on renal dialysis: Secondary | ICD-10-CM | POA: Diagnosis not present

## 2021-12-28 DIAGNOSIS — D509 Iron deficiency anemia, unspecified: Secondary | ICD-10-CM | POA: Diagnosis not present

## 2021-12-28 DIAGNOSIS — D689 Coagulation defect, unspecified: Secondary | ICD-10-CM | POA: Diagnosis not present

## 2021-12-30 DIAGNOSIS — N186 End stage renal disease: Secondary | ICD-10-CM | POA: Diagnosis not present

## 2021-12-30 DIAGNOSIS — N2581 Secondary hyperparathyroidism of renal origin: Secondary | ICD-10-CM | POA: Diagnosis not present

## 2021-12-30 DIAGNOSIS — D631 Anemia in chronic kidney disease: Secondary | ICD-10-CM | POA: Diagnosis not present

## 2021-12-30 DIAGNOSIS — D509 Iron deficiency anemia, unspecified: Secondary | ICD-10-CM | POA: Diagnosis not present

## 2021-12-30 DIAGNOSIS — D696 Thrombocytopenia, unspecified: Secondary | ICD-10-CM | POA: Diagnosis not present

## 2021-12-30 DIAGNOSIS — Z992 Dependence on renal dialysis: Secondary | ICD-10-CM | POA: Diagnosis not present

## 2021-12-30 DIAGNOSIS — D689 Coagulation defect, unspecified: Secondary | ICD-10-CM | POA: Diagnosis not present

## 2022-01-01 DIAGNOSIS — D509 Iron deficiency anemia, unspecified: Secondary | ICD-10-CM | POA: Diagnosis not present

## 2022-01-01 DIAGNOSIS — N2581 Secondary hyperparathyroidism of renal origin: Secondary | ICD-10-CM | POA: Diagnosis not present

## 2022-01-01 DIAGNOSIS — D631 Anemia in chronic kidney disease: Secondary | ICD-10-CM | POA: Diagnosis not present

## 2022-01-01 DIAGNOSIS — N186 End stage renal disease: Secondary | ICD-10-CM | POA: Diagnosis not present

## 2022-01-01 DIAGNOSIS — D689 Coagulation defect, unspecified: Secondary | ICD-10-CM | POA: Diagnosis not present

## 2022-01-01 DIAGNOSIS — Z992 Dependence on renal dialysis: Secondary | ICD-10-CM | POA: Diagnosis not present

## 2022-01-01 DIAGNOSIS — D696 Thrombocytopenia, unspecified: Secondary | ICD-10-CM | POA: Diagnosis not present

## 2022-01-04 DIAGNOSIS — D696 Thrombocytopenia, unspecified: Secondary | ICD-10-CM | POA: Diagnosis not present

## 2022-01-04 DIAGNOSIS — D631 Anemia in chronic kidney disease: Secondary | ICD-10-CM | POA: Diagnosis not present

## 2022-01-04 DIAGNOSIS — D689 Coagulation defect, unspecified: Secondary | ICD-10-CM | POA: Diagnosis not present

## 2022-01-04 DIAGNOSIS — N186 End stage renal disease: Secondary | ICD-10-CM | POA: Diagnosis not present

## 2022-01-04 DIAGNOSIS — Z992 Dependence on renal dialysis: Secondary | ICD-10-CM | POA: Diagnosis not present

## 2022-01-04 DIAGNOSIS — D509 Iron deficiency anemia, unspecified: Secondary | ICD-10-CM | POA: Diagnosis not present

## 2022-01-04 DIAGNOSIS — N2581 Secondary hyperparathyroidism of renal origin: Secondary | ICD-10-CM | POA: Diagnosis not present

## 2022-01-06 DIAGNOSIS — N2581 Secondary hyperparathyroidism of renal origin: Secondary | ICD-10-CM | POA: Diagnosis not present

## 2022-01-06 DIAGNOSIS — Z992 Dependence on renal dialysis: Secondary | ICD-10-CM | POA: Diagnosis not present

## 2022-01-06 DIAGNOSIS — D689 Coagulation defect, unspecified: Secondary | ICD-10-CM | POA: Diagnosis not present

## 2022-01-06 DIAGNOSIS — D696 Thrombocytopenia, unspecified: Secondary | ICD-10-CM | POA: Diagnosis not present

## 2022-01-06 DIAGNOSIS — N186 End stage renal disease: Secondary | ICD-10-CM | POA: Diagnosis not present

## 2022-01-06 DIAGNOSIS — D631 Anemia in chronic kidney disease: Secondary | ICD-10-CM | POA: Diagnosis not present

## 2022-01-06 DIAGNOSIS — D509 Iron deficiency anemia, unspecified: Secondary | ICD-10-CM | POA: Diagnosis not present

## 2022-01-08 DIAGNOSIS — D689 Coagulation defect, unspecified: Secondary | ICD-10-CM | POA: Diagnosis not present

## 2022-01-08 DIAGNOSIS — D631 Anemia in chronic kidney disease: Secondary | ICD-10-CM | POA: Diagnosis not present

## 2022-01-08 DIAGNOSIS — N2581 Secondary hyperparathyroidism of renal origin: Secondary | ICD-10-CM | POA: Diagnosis not present

## 2022-01-08 DIAGNOSIS — Z992 Dependence on renal dialysis: Secondary | ICD-10-CM | POA: Diagnosis not present

## 2022-01-08 DIAGNOSIS — N186 End stage renal disease: Secondary | ICD-10-CM | POA: Diagnosis not present

## 2022-01-08 DIAGNOSIS — D509 Iron deficiency anemia, unspecified: Secondary | ICD-10-CM | POA: Diagnosis not present

## 2022-01-08 DIAGNOSIS — D696 Thrombocytopenia, unspecified: Secondary | ICD-10-CM | POA: Diagnosis not present

## 2022-01-11 DIAGNOSIS — D631 Anemia in chronic kidney disease: Secondary | ICD-10-CM | POA: Diagnosis not present

## 2022-01-11 DIAGNOSIS — N2581 Secondary hyperparathyroidism of renal origin: Secondary | ICD-10-CM | POA: Diagnosis not present

## 2022-01-11 DIAGNOSIS — D509 Iron deficiency anemia, unspecified: Secondary | ICD-10-CM | POA: Diagnosis not present

## 2022-01-11 DIAGNOSIS — Z992 Dependence on renal dialysis: Secondary | ICD-10-CM | POA: Diagnosis not present

## 2022-01-11 DIAGNOSIS — D696 Thrombocytopenia, unspecified: Secondary | ICD-10-CM | POA: Diagnosis not present

## 2022-01-11 DIAGNOSIS — D689 Coagulation defect, unspecified: Secondary | ICD-10-CM | POA: Diagnosis not present

## 2022-01-11 DIAGNOSIS — N186 End stage renal disease: Secondary | ICD-10-CM | POA: Diagnosis not present

## 2022-01-13 DIAGNOSIS — N2581 Secondary hyperparathyroidism of renal origin: Secondary | ICD-10-CM | POA: Diagnosis not present

## 2022-01-13 DIAGNOSIS — D696 Thrombocytopenia, unspecified: Secondary | ICD-10-CM | POA: Diagnosis not present

## 2022-01-13 DIAGNOSIS — D689 Coagulation defect, unspecified: Secondary | ICD-10-CM | POA: Diagnosis not present

## 2022-01-13 DIAGNOSIS — Z992 Dependence on renal dialysis: Secondary | ICD-10-CM | POA: Diagnosis not present

## 2022-01-13 DIAGNOSIS — N186 End stage renal disease: Secondary | ICD-10-CM | POA: Diagnosis not present

## 2022-01-13 DIAGNOSIS — D509 Iron deficiency anemia, unspecified: Secondary | ICD-10-CM | POA: Diagnosis not present

## 2022-01-14 DIAGNOSIS — J0100 Acute maxillary sinusitis, unspecified: Secondary | ICD-10-CM | POA: Diagnosis not present

## 2022-01-14 DIAGNOSIS — N185 Chronic kidney disease, stage 5: Secondary | ICD-10-CM | POA: Diagnosis not present

## 2022-01-14 DIAGNOSIS — I10 Essential (primary) hypertension: Secondary | ICD-10-CM | POA: Diagnosis not present

## 2022-01-14 DIAGNOSIS — D638 Anemia in other chronic diseases classified elsewhere: Secondary | ICD-10-CM | POA: Diagnosis not present

## 2022-01-14 DIAGNOSIS — E119 Type 2 diabetes mellitus without complications: Secondary | ICD-10-CM | POA: Diagnosis not present

## 2022-01-14 DIAGNOSIS — E785 Hyperlipidemia, unspecified: Secondary | ICD-10-CM | POA: Diagnosis not present

## 2022-01-14 DIAGNOSIS — F5101 Primary insomnia: Secondary | ICD-10-CM | POA: Diagnosis not present

## 2022-01-15 DIAGNOSIS — D696 Thrombocytopenia, unspecified: Secondary | ICD-10-CM | POA: Diagnosis not present

## 2022-01-15 DIAGNOSIS — N186 End stage renal disease: Secondary | ICD-10-CM | POA: Diagnosis not present

## 2022-01-15 DIAGNOSIS — N2581 Secondary hyperparathyroidism of renal origin: Secondary | ICD-10-CM | POA: Diagnosis not present

## 2022-01-15 DIAGNOSIS — Z992 Dependence on renal dialysis: Secondary | ICD-10-CM | POA: Diagnosis not present

## 2022-01-15 DIAGNOSIS — D509 Iron deficiency anemia, unspecified: Secondary | ICD-10-CM | POA: Diagnosis not present

## 2022-01-15 DIAGNOSIS — D689 Coagulation defect, unspecified: Secondary | ICD-10-CM | POA: Diagnosis not present

## 2022-01-18 DIAGNOSIS — D696 Thrombocytopenia, unspecified: Secondary | ICD-10-CM | POA: Diagnosis not present

## 2022-01-18 DIAGNOSIS — N186 End stage renal disease: Secondary | ICD-10-CM | POA: Diagnosis not present

## 2022-01-18 DIAGNOSIS — Z992 Dependence on renal dialysis: Secondary | ICD-10-CM | POA: Diagnosis not present

## 2022-01-18 DIAGNOSIS — D509 Iron deficiency anemia, unspecified: Secondary | ICD-10-CM | POA: Diagnosis not present

## 2022-01-18 DIAGNOSIS — D689 Coagulation defect, unspecified: Secondary | ICD-10-CM | POA: Diagnosis not present

## 2022-01-18 DIAGNOSIS — N2581 Secondary hyperparathyroidism of renal origin: Secondary | ICD-10-CM | POA: Diagnosis not present

## 2022-01-20 DIAGNOSIS — N2581 Secondary hyperparathyroidism of renal origin: Secondary | ICD-10-CM | POA: Diagnosis not present

## 2022-01-20 DIAGNOSIS — D509 Iron deficiency anemia, unspecified: Secondary | ICD-10-CM | POA: Diagnosis not present

## 2022-01-20 DIAGNOSIS — D689 Coagulation defect, unspecified: Secondary | ICD-10-CM | POA: Diagnosis not present

## 2022-01-20 DIAGNOSIS — N186 End stage renal disease: Secondary | ICD-10-CM | POA: Diagnosis not present

## 2022-01-20 DIAGNOSIS — Z992 Dependence on renal dialysis: Secondary | ICD-10-CM | POA: Diagnosis not present

## 2022-01-20 DIAGNOSIS — D696 Thrombocytopenia, unspecified: Secondary | ICD-10-CM | POA: Diagnosis not present

## 2022-01-22 DIAGNOSIS — N186 End stage renal disease: Secondary | ICD-10-CM | POA: Diagnosis not present

## 2022-01-22 DIAGNOSIS — Z992 Dependence on renal dialysis: Secondary | ICD-10-CM | POA: Diagnosis not present

## 2022-01-22 DIAGNOSIS — N2581 Secondary hyperparathyroidism of renal origin: Secondary | ICD-10-CM | POA: Diagnosis not present

## 2022-01-22 DIAGNOSIS — D696 Thrombocytopenia, unspecified: Secondary | ICD-10-CM | POA: Diagnosis not present

## 2022-01-22 DIAGNOSIS — D689 Coagulation defect, unspecified: Secondary | ICD-10-CM | POA: Diagnosis not present

## 2022-01-22 DIAGNOSIS — D509 Iron deficiency anemia, unspecified: Secondary | ICD-10-CM | POA: Diagnosis not present

## 2022-01-25 DIAGNOSIS — D509 Iron deficiency anemia, unspecified: Secondary | ICD-10-CM | POA: Diagnosis not present

## 2022-01-25 DIAGNOSIS — D689 Coagulation defect, unspecified: Secondary | ICD-10-CM | POA: Diagnosis not present

## 2022-01-25 DIAGNOSIS — Z992 Dependence on renal dialysis: Secondary | ICD-10-CM | POA: Diagnosis not present

## 2022-01-25 DIAGNOSIS — N186 End stage renal disease: Secondary | ICD-10-CM | POA: Diagnosis not present

## 2022-01-25 DIAGNOSIS — D696 Thrombocytopenia, unspecified: Secondary | ICD-10-CM | POA: Diagnosis not present

## 2022-01-25 DIAGNOSIS — N2581 Secondary hyperparathyroidism of renal origin: Secondary | ICD-10-CM | POA: Diagnosis not present

## 2022-01-26 DIAGNOSIS — E1122 Type 2 diabetes mellitus with diabetic chronic kidney disease: Secondary | ICD-10-CM | POA: Diagnosis not present

## 2022-01-26 DIAGNOSIS — T82858A Stenosis of vascular prosthetic devices, implants and grafts, initial encounter: Secondary | ICD-10-CM | POA: Diagnosis not present

## 2022-01-26 DIAGNOSIS — T82898A Other specified complication of vascular prosthetic devices, implants and grafts, initial encounter: Secondary | ICD-10-CM | POA: Diagnosis not present

## 2022-01-27 DIAGNOSIS — D696 Thrombocytopenia, unspecified: Secondary | ICD-10-CM | POA: Diagnosis not present

## 2022-01-27 DIAGNOSIS — D689 Coagulation defect, unspecified: Secondary | ICD-10-CM | POA: Diagnosis not present

## 2022-01-27 DIAGNOSIS — D509 Iron deficiency anemia, unspecified: Secondary | ICD-10-CM | POA: Diagnosis not present

## 2022-01-27 DIAGNOSIS — N2581 Secondary hyperparathyroidism of renal origin: Secondary | ICD-10-CM | POA: Diagnosis not present

## 2022-01-27 DIAGNOSIS — Z992 Dependence on renal dialysis: Secondary | ICD-10-CM | POA: Diagnosis not present

## 2022-01-27 DIAGNOSIS — N186 End stage renal disease: Secondary | ICD-10-CM | POA: Diagnosis not present

## 2022-01-29 DIAGNOSIS — D696 Thrombocytopenia, unspecified: Secondary | ICD-10-CM | POA: Diagnosis not present

## 2022-01-29 DIAGNOSIS — D689 Coagulation defect, unspecified: Secondary | ICD-10-CM | POA: Diagnosis not present

## 2022-01-29 DIAGNOSIS — N186 End stage renal disease: Secondary | ICD-10-CM | POA: Diagnosis not present

## 2022-01-29 DIAGNOSIS — Z992 Dependence on renal dialysis: Secondary | ICD-10-CM | POA: Diagnosis not present

## 2022-01-29 DIAGNOSIS — N2581 Secondary hyperparathyroidism of renal origin: Secondary | ICD-10-CM | POA: Diagnosis not present

## 2022-01-29 DIAGNOSIS — D509 Iron deficiency anemia, unspecified: Secondary | ICD-10-CM | POA: Diagnosis not present

## 2022-02-01 DIAGNOSIS — Z992 Dependence on renal dialysis: Secondary | ICD-10-CM | POA: Diagnosis not present

## 2022-02-01 DIAGNOSIS — D689 Coagulation defect, unspecified: Secondary | ICD-10-CM | POA: Diagnosis not present

## 2022-02-01 DIAGNOSIS — D509 Iron deficiency anemia, unspecified: Secondary | ICD-10-CM | POA: Diagnosis not present

## 2022-02-01 DIAGNOSIS — D696 Thrombocytopenia, unspecified: Secondary | ICD-10-CM | POA: Diagnosis not present

## 2022-02-01 DIAGNOSIS — N186 End stage renal disease: Secondary | ICD-10-CM | POA: Diagnosis not present

## 2022-02-01 DIAGNOSIS — N2581 Secondary hyperparathyroidism of renal origin: Secondary | ICD-10-CM | POA: Diagnosis not present

## 2022-02-03 DIAGNOSIS — D509 Iron deficiency anemia, unspecified: Secondary | ICD-10-CM | POA: Diagnosis not present

## 2022-02-03 DIAGNOSIS — D696 Thrombocytopenia, unspecified: Secondary | ICD-10-CM | POA: Diagnosis not present

## 2022-02-03 DIAGNOSIS — N2581 Secondary hyperparathyroidism of renal origin: Secondary | ICD-10-CM | POA: Diagnosis not present

## 2022-02-03 DIAGNOSIS — Z992 Dependence on renal dialysis: Secondary | ICD-10-CM | POA: Diagnosis not present

## 2022-02-03 DIAGNOSIS — N186 End stage renal disease: Secondary | ICD-10-CM | POA: Diagnosis not present

## 2022-02-03 DIAGNOSIS — D689 Coagulation defect, unspecified: Secondary | ICD-10-CM | POA: Diagnosis not present

## 2022-02-05 DIAGNOSIS — D509 Iron deficiency anemia, unspecified: Secondary | ICD-10-CM | POA: Diagnosis not present

## 2022-02-05 DIAGNOSIS — D689 Coagulation defect, unspecified: Secondary | ICD-10-CM | POA: Diagnosis not present

## 2022-02-05 DIAGNOSIS — Z992 Dependence on renal dialysis: Secondary | ICD-10-CM | POA: Diagnosis not present

## 2022-02-05 DIAGNOSIS — D696 Thrombocytopenia, unspecified: Secondary | ICD-10-CM | POA: Diagnosis not present

## 2022-02-05 DIAGNOSIS — N2581 Secondary hyperparathyroidism of renal origin: Secondary | ICD-10-CM | POA: Diagnosis not present

## 2022-02-05 DIAGNOSIS — N186 End stage renal disease: Secondary | ICD-10-CM | POA: Diagnosis not present

## 2022-02-08 DIAGNOSIS — N186 End stage renal disease: Secondary | ICD-10-CM | POA: Diagnosis not present

## 2022-02-08 DIAGNOSIS — Z992 Dependence on renal dialysis: Secondary | ICD-10-CM | POA: Diagnosis not present

## 2022-02-08 DIAGNOSIS — D689 Coagulation defect, unspecified: Secondary | ICD-10-CM | POA: Diagnosis not present

## 2022-02-08 DIAGNOSIS — D509 Iron deficiency anemia, unspecified: Secondary | ICD-10-CM | POA: Diagnosis not present

## 2022-02-08 DIAGNOSIS — D696 Thrombocytopenia, unspecified: Secondary | ICD-10-CM | POA: Diagnosis not present

## 2022-02-08 DIAGNOSIS — N2581 Secondary hyperparathyroidism of renal origin: Secondary | ICD-10-CM | POA: Diagnosis not present

## 2022-02-10 DIAGNOSIS — D509 Iron deficiency anemia, unspecified: Secondary | ICD-10-CM | POA: Diagnosis not present

## 2022-02-10 DIAGNOSIS — N2581 Secondary hyperparathyroidism of renal origin: Secondary | ICD-10-CM | POA: Diagnosis not present

## 2022-02-10 DIAGNOSIS — Z992 Dependence on renal dialysis: Secondary | ICD-10-CM | POA: Diagnosis not present

## 2022-02-10 DIAGNOSIS — D689 Coagulation defect, unspecified: Secondary | ICD-10-CM | POA: Diagnosis not present

## 2022-02-10 DIAGNOSIS — D696 Thrombocytopenia, unspecified: Secondary | ICD-10-CM | POA: Diagnosis not present

## 2022-02-10 DIAGNOSIS — N186 End stage renal disease: Secondary | ICD-10-CM | POA: Diagnosis not present

## 2022-02-11 DIAGNOSIS — Z992 Dependence on renal dialysis: Secondary | ICD-10-CM | POA: Diagnosis not present

## 2022-02-11 DIAGNOSIS — N186 End stage renal disease: Secondary | ICD-10-CM | POA: Diagnosis not present

## 2022-02-12 DIAGNOSIS — D509 Iron deficiency anemia, unspecified: Secondary | ICD-10-CM | POA: Diagnosis not present

## 2022-02-12 DIAGNOSIS — N2581 Secondary hyperparathyroidism of renal origin: Secondary | ICD-10-CM | POA: Diagnosis not present

## 2022-02-12 DIAGNOSIS — D689 Coagulation defect, unspecified: Secondary | ICD-10-CM | POA: Diagnosis not present

## 2022-02-12 DIAGNOSIS — N186 End stage renal disease: Secondary | ICD-10-CM | POA: Diagnosis not present

## 2022-02-12 DIAGNOSIS — Z992 Dependence on renal dialysis: Secondary | ICD-10-CM | POA: Diagnosis not present

## 2022-02-12 DIAGNOSIS — E119 Type 2 diabetes mellitus without complications: Secondary | ICD-10-CM | POA: Diagnosis not present

## 2022-02-15 DIAGNOSIS — D689 Coagulation defect, unspecified: Secondary | ICD-10-CM | POA: Diagnosis not present

## 2022-02-15 DIAGNOSIS — Z992 Dependence on renal dialysis: Secondary | ICD-10-CM | POA: Diagnosis not present

## 2022-02-15 DIAGNOSIS — N2581 Secondary hyperparathyroidism of renal origin: Secondary | ICD-10-CM | POA: Diagnosis not present

## 2022-02-15 DIAGNOSIS — D509 Iron deficiency anemia, unspecified: Secondary | ICD-10-CM | POA: Diagnosis not present

## 2022-02-15 DIAGNOSIS — E119 Type 2 diabetes mellitus without complications: Secondary | ICD-10-CM | POA: Diagnosis not present

## 2022-02-15 DIAGNOSIS — N186 End stage renal disease: Secondary | ICD-10-CM | POA: Diagnosis not present

## 2022-02-17 DIAGNOSIS — Z992 Dependence on renal dialysis: Secondary | ICD-10-CM | POA: Diagnosis not present

## 2022-02-17 DIAGNOSIS — D689 Coagulation defect, unspecified: Secondary | ICD-10-CM | POA: Diagnosis not present

## 2022-02-17 DIAGNOSIS — N186 End stage renal disease: Secondary | ICD-10-CM | POA: Diagnosis not present

## 2022-02-17 DIAGNOSIS — D509 Iron deficiency anemia, unspecified: Secondary | ICD-10-CM | POA: Diagnosis not present

## 2022-02-17 DIAGNOSIS — N2581 Secondary hyperparathyroidism of renal origin: Secondary | ICD-10-CM | POA: Diagnosis not present

## 2022-02-17 DIAGNOSIS — E119 Type 2 diabetes mellitus without complications: Secondary | ICD-10-CM | POA: Diagnosis not present

## 2022-02-19 DIAGNOSIS — N2581 Secondary hyperparathyroidism of renal origin: Secondary | ICD-10-CM | POA: Diagnosis not present

## 2022-02-19 DIAGNOSIS — D689 Coagulation defect, unspecified: Secondary | ICD-10-CM | POA: Diagnosis not present

## 2022-02-19 DIAGNOSIS — D509 Iron deficiency anemia, unspecified: Secondary | ICD-10-CM | POA: Diagnosis not present

## 2022-02-19 DIAGNOSIS — N186 End stage renal disease: Secondary | ICD-10-CM | POA: Diagnosis not present

## 2022-02-19 DIAGNOSIS — Z992 Dependence on renal dialysis: Secondary | ICD-10-CM | POA: Diagnosis not present

## 2022-02-19 DIAGNOSIS — E119 Type 2 diabetes mellitus without complications: Secondary | ICD-10-CM | POA: Diagnosis not present

## 2022-02-22 DIAGNOSIS — N186 End stage renal disease: Secondary | ICD-10-CM | POA: Diagnosis not present

## 2022-02-22 DIAGNOSIS — E119 Type 2 diabetes mellitus without complications: Secondary | ICD-10-CM | POA: Diagnosis not present

## 2022-02-22 DIAGNOSIS — N2581 Secondary hyperparathyroidism of renal origin: Secondary | ICD-10-CM | POA: Diagnosis not present

## 2022-02-22 DIAGNOSIS — Z992 Dependence on renal dialysis: Secondary | ICD-10-CM | POA: Diagnosis not present

## 2022-02-22 DIAGNOSIS — D509 Iron deficiency anemia, unspecified: Secondary | ICD-10-CM | POA: Diagnosis not present

## 2022-02-22 DIAGNOSIS — D689 Coagulation defect, unspecified: Secondary | ICD-10-CM | POA: Diagnosis not present

## 2022-02-24 DIAGNOSIS — D509 Iron deficiency anemia, unspecified: Secondary | ICD-10-CM | POA: Diagnosis not present

## 2022-02-24 DIAGNOSIS — E119 Type 2 diabetes mellitus without complications: Secondary | ICD-10-CM | POA: Diagnosis not present

## 2022-02-24 DIAGNOSIS — Z992 Dependence on renal dialysis: Secondary | ICD-10-CM | POA: Diagnosis not present

## 2022-02-24 DIAGNOSIS — D689 Coagulation defect, unspecified: Secondary | ICD-10-CM | POA: Diagnosis not present

## 2022-02-24 DIAGNOSIS — N2581 Secondary hyperparathyroidism of renal origin: Secondary | ICD-10-CM | POA: Diagnosis not present

## 2022-02-24 DIAGNOSIS — N186 End stage renal disease: Secondary | ICD-10-CM | POA: Diagnosis not present

## 2022-02-26 DIAGNOSIS — D689 Coagulation defect, unspecified: Secondary | ICD-10-CM | POA: Diagnosis not present

## 2022-02-26 DIAGNOSIS — N2581 Secondary hyperparathyroidism of renal origin: Secondary | ICD-10-CM | POA: Diagnosis not present

## 2022-02-26 DIAGNOSIS — E119 Type 2 diabetes mellitus without complications: Secondary | ICD-10-CM | POA: Diagnosis not present

## 2022-02-26 DIAGNOSIS — Z992 Dependence on renal dialysis: Secondary | ICD-10-CM | POA: Diagnosis not present

## 2022-02-26 DIAGNOSIS — D509 Iron deficiency anemia, unspecified: Secondary | ICD-10-CM | POA: Diagnosis not present

## 2022-02-26 DIAGNOSIS — N186 End stage renal disease: Secondary | ICD-10-CM | POA: Diagnosis not present

## 2022-03-01 DIAGNOSIS — D689 Coagulation defect, unspecified: Secondary | ICD-10-CM | POA: Diagnosis not present

## 2022-03-01 DIAGNOSIS — N2581 Secondary hyperparathyroidism of renal origin: Secondary | ICD-10-CM | POA: Diagnosis not present

## 2022-03-01 DIAGNOSIS — N186 End stage renal disease: Secondary | ICD-10-CM | POA: Diagnosis not present

## 2022-03-01 DIAGNOSIS — D509 Iron deficiency anemia, unspecified: Secondary | ICD-10-CM | POA: Diagnosis not present

## 2022-03-01 DIAGNOSIS — Z992 Dependence on renal dialysis: Secondary | ICD-10-CM | POA: Diagnosis not present

## 2022-03-01 DIAGNOSIS — E119 Type 2 diabetes mellitus without complications: Secondary | ICD-10-CM | POA: Diagnosis not present

## 2022-03-03 DIAGNOSIS — D689 Coagulation defect, unspecified: Secondary | ICD-10-CM | POA: Diagnosis not present

## 2022-03-03 DIAGNOSIS — E119 Type 2 diabetes mellitus without complications: Secondary | ICD-10-CM | POA: Diagnosis not present

## 2022-03-03 DIAGNOSIS — Z992 Dependence on renal dialysis: Secondary | ICD-10-CM | POA: Diagnosis not present

## 2022-03-03 DIAGNOSIS — N2581 Secondary hyperparathyroidism of renal origin: Secondary | ICD-10-CM | POA: Diagnosis not present

## 2022-03-03 DIAGNOSIS — D509 Iron deficiency anemia, unspecified: Secondary | ICD-10-CM | POA: Diagnosis not present

## 2022-03-03 DIAGNOSIS — N186 End stage renal disease: Secondary | ICD-10-CM | POA: Diagnosis not present

## 2022-03-05 ENCOUNTER — Emergency Department (HOSPITAL_COMMUNITY): Payer: Medicare Other

## 2022-03-05 ENCOUNTER — Other Ambulatory Visit: Payer: Self-pay

## 2022-03-05 ENCOUNTER — Encounter (HOSPITAL_COMMUNITY): Payer: Self-pay

## 2022-03-05 ENCOUNTER — Emergency Department (HOSPITAL_COMMUNITY)
Admission: EM | Admit: 2022-03-05 | Discharge: 2022-03-05 | Disposition: A | Discharge status: Home / Self Care | Payer: Medicare Other | Attending: Emergency Medicine | Admitting: Emergency Medicine

## 2022-03-05 ENCOUNTER — Telehealth: Payer: Self-pay | Admitting: Internal Medicine

## 2022-03-05 ENCOUNTER — Encounter (HOSPITAL_COMMUNITY): Payer: Self-pay | Admitting: *Deleted

## 2022-03-05 DIAGNOSIS — N186 End stage renal disease: Secondary | ICD-10-CM | POA: Diagnosis not present

## 2022-03-05 DIAGNOSIS — R791 Abnormal coagulation profile: Secondary | ICD-10-CM | POA: Insufficient documentation

## 2022-03-05 DIAGNOSIS — E119 Type 2 diabetes mellitus without complications: Secondary | ICD-10-CM | POA: Diagnosis not present

## 2022-03-05 DIAGNOSIS — R7989 Other specified abnormal findings of blood chemistry: Secondary | ICD-10-CM | POA: Insufficient documentation

## 2022-03-05 DIAGNOSIS — R778 Other specified abnormalities of plasma proteins: Secondary | ICD-10-CM | POA: Diagnosis not present

## 2022-03-05 DIAGNOSIS — I5030 Unspecified diastolic (congestive) heart failure: Secondary | ICD-10-CM | POA: Diagnosis not present

## 2022-03-05 DIAGNOSIS — Z79899 Other long term (current) drug therapy: Secondary | ICD-10-CM | POA: Insufficient documentation

## 2022-03-05 DIAGNOSIS — J90 Pleural effusion, not elsewhere classified: Secondary | ICD-10-CM | POA: Insufficient documentation

## 2022-03-05 DIAGNOSIS — R0602 Shortness of breath: Secondary | ICD-10-CM | POA: Diagnosis not present

## 2022-03-05 DIAGNOSIS — I2510 Atherosclerotic heart disease of native coronary artery without angina pectoris: Secondary | ICD-10-CM | POA: Insufficient documentation

## 2022-03-05 DIAGNOSIS — Z7982 Long term (current) use of aspirin: Secondary | ICD-10-CM | POA: Diagnosis not present

## 2022-03-05 DIAGNOSIS — Z794 Long term (current) use of insulin: Secondary | ICD-10-CM | POA: Diagnosis not present

## 2022-03-05 DIAGNOSIS — I132 Hypertensive heart and chronic kidney disease with heart failure and with stage 5 chronic kidney disease, or end stage renal disease: Secondary | ICD-10-CM | POA: Diagnosis not present

## 2022-03-05 DIAGNOSIS — Z951 Presence of aortocoronary bypass graft: Secondary | ICD-10-CM | POA: Diagnosis not present

## 2022-03-05 DIAGNOSIS — D509 Iron deficiency anemia, unspecified: Secondary | ICD-10-CM | POA: Diagnosis not present

## 2022-03-05 DIAGNOSIS — R0789 Other chest pain: Secondary | ICD-10-CM | POA: Diagnosis not present

## 2022-03-05 DIAGNOSIS — R531 Weakness: Secondary | ICD-10-CM | POA: Diagnosis not present

## 2022-03-05 DIAGNOSIS — E1165 Type 2 diabetes mellitus with hyperglycemia: Secondary | ICD-10-CM | POA: Diagnosis not present

## 2022-03-05 DIAGNOSIS — N281 Cyst of kidney, acquired: Secondary | ICD-10-CM | POA: Insufficient documentation

## 2022-03-05 DIAGNOSIS — N2889 Other specified disorders of kidney and ureter: Secondary | ICD-10-CM | POA: Diagnosis not present

## 2022-03-05 DIAGNOSIS — E1122 Type 2 diabetes mellitus with diabetic chronic kidney disease: Secondary | ICD-10-CM | POA: Diagnosis not present

## 2022-03-05 DIAGNOSIS — N2581 Secondary hyperparathyroidism of renal origin: Secondary | ICD-10-CM | POA: Diagnosis not present

## 2022-03-05 DIAGNOSIS — Z992 Dependence on renal dialysis: Secondary | ICD-10-CM | POA: Diagnosis not present

## 2022-03-05 DIAGNOSIS — D689 Coagulation defect, unspecified: Secondary | ICD-10-CM | POA: Diagnosis not present

## 2022-03-05 LAB — CBC
HCT: 36.4 % — ABNORMAL LOW (ref 39.0–52.0)
Hemoglobin: 11.4 g/dL — ABNORMAL LOW (ref 13.0–17.0)
MCH: 29.6 pg (ref 26.0–34.0)
MCHC: 31.3 g/dL (ref 30.0–36.0)
MCV: 94.5 fL (ref 80.0–100.0)
Platelets: 312 10*3/uL (ref 150–400)
RBC: 3.85 MIL/uL — ABNORMAL LOW (ref 4.22–5.81)
RDW: 14.9 % (ref 11.5–15.5)
WBC: 6 10*3/uL (ref 4.0–10.5)
nRBC: 0 % (ref 0.0–0.2)

## 2022-03-05 LAB — HEPATIC FUNCTION PANEL
ALT: 21 U/L (ref 0–44)
AST: 19 U/L (ref 15–41)
Albumin: 3.6 g/dL (ref 3.5–5.0)
Alkaline Phosphatase: 98 U/L (ref 38–126)
Bilirubin, Direct: 0.1 mg/dL (ref 0.0–0.2)
Indirect Bilirubin: 0.7 mg/dL (ref 0.3–0.9)
Total Bilirubin: 0.8 mg/dL (ref 0.3–1.2)
Total Protein: 8.1 g/dL (ref 6.5–8.1)

## 2022-03-05 LAB — BASIC METABOLIC PANEL
Anion gap: 13 (ref 5–15)
BUN: 16 mg/dL (ref 8–23)
CO2: 34 mmol/L — ABNORMAL HIGH (ref 22–32)
Calcium: 9 mg/dL (ref 8.9–10.3)
Chloride: 91 mmol/L — ABNORMAL LOW (ref 98–111)
Creatinine, Ser: 4.34 mg/dL — ABNORMAL HIGH (ref 0.61–1.24)
GFR, Estimated: 14 mL/min — ABNORMAL LOW (ref >60)
Glucose, Bld: 251 mg/dL — ABNORMAL HIGH (ref 70–99)
Potassium: 4 mmol/L (ref 3.5–5.1)
Sodium: 138 mmol/L (ref 135–145)

## 2022-03-05 LAB — TROPONIN I (HIGH SENSITIVITY)
Troponin I (High Sensitivity): 130 ng/L (ref <18)
Troponin I (High Sensitivity): 122 ng/L (ref <18)

## 2022-03-05 LAB — BRAIN NATRIURETIC PEPTIDE: B Natriuretic Peptide: 944 pg/mL — ABNORMAL HIGH (ref 0.0–100.0)

## 2022-03-05 LAB — D-DIMER, QUANTITATIVE: D-Dimer, Quant: 6.99 ug/mL-FEU — ABNORMAL HIGH (ref 0.00–0.50)

## 2022-03-05 MED ORDER — ASPIRIN 81 MG PO CHEW
324.0000 mg | CHEWABLE_TABLET | Freq: Once | ORAL | Status: AC
  Administered 2022-03-05: 324 mg via ORAL
  Filled 2022-03-05: qty 4

## 2022-03-05 NOTE — ED Provider Notes (Signed)
I spoke to Dr. Chase Caller of critical care and he reviewed the x-ray and CT and old records on this patient.  He felt like the patient could follow-up at [facility] pulmonary either in [place] or [place].  He is going to send information so they will call him for an appointment.  He stated that he did not see any need to treat this patient with antibiotics   Milton Ferguson, MD 03/05/22 1932

## 2022-03-05 NOTE — ED Notes (Signed)
Pt to CT

## 2022-03-05 NOTE — ED Notes (Signed)
Ambulated pt around in pt"s room. Pts O2 Stayed between 97% and 100%. Pt stated that he was feeling a little dizzy while ambulating. Pt also stated that his legs felt a little weak. Pt stated while ambulating that he started to feel fatigue.

## 2022-03-05 NOTE — Discharge Instructions (Signed)
Follow-up with [place] pulmonary specialist.  They have been offered in [place] and in [place].  If you do not hear from them the beginning of the week you should call them and make an appointment.  Return if problems

## 2022-03-05 NOTE — ED Provider Notes (Signed)
[facility] EMERGENCY DEPARTMENT Provider Note   CSN: [id number] Arrival date & time: 03/05/22  1349     History  Chief Complaint  Patient presents with   Weakness    [name] is a 71 y.o. male with history of CAD, ESRD on dialysis, type 2 diabetes, hypertension, and diastolic heart failure who presents the emergency department complaining of dyspnea on exertion, generalized weakness, and intermittent chest tightness for the past 4 weeks.  Patient states that he has been getting more tired with minimal activity recently.  He states that after exerting himself he has some tightness in his chest, but that completely resolves upon resting.  He feels nauseous every time he leaves dialysis.  No fever or cough.  Most recently dialyzed today, and received full treatment.  No leg swelling or pain.  He is not on chronic anticoagulation.   Weakness Associated symptoms: shortness of breath   Associated symptoms: no abdominal pain, no cough and no fever        Home Medications Prior to Admission medications   Medication Sig Start Date End Date Taking? Authorizing Provider  acetaminophen (TYLENOL) 500 MG tablet Take 325 mg by mouth every 6 (six) hours as needed for mild pain or moderate pain.    [provider]  albuterol (PROVENTIL HFA;VENTOLIN HFA) 108 (90 Base) MCG/ACT inhaler Inhale 2 puffs into the lungs every 6 (six) hours as needed. 07/29/18   Rancour, Annie Main, MD  amitriptyline (ELAVIL) 25 MG tablet Take 25 mg by mouth at bedtime. 01/21/21   [provider]  amLODipine (NORVASC) 10 MG tablet Take 1 tablet (10 mg total) by mouth daily. 02/01/21   Roxan Hockey, MD  aspirin EC 325 MG tablet Take '325mg'$ s once daily on non dialysis days. Dialysis days are Mon, Wed, Fri    [provider]  atorvastatin (LIPITOR) 80 MG tablet Take 1 tablet (80 mg total) by mouth daily at 6 PM. Patient not taking: No sig reported 04/10/15   Nani Skillern, PA-C   carvedilol (COREG) 25 MG tablet Take 1 tablet (25 mg total) by mouth 2 (two) times daily. 02/01/21   Roxan Hockey, MD  cloNIDine (CATAPRES) 0.2 MG tablet Take 1 tablet (0.2 mg total) by mouth 2 (two) times daily. 02/01/21   Roxan Hockey, MD  diphenhydramine-acetaminophen (TYLENOL PM) 25-500 MG TABS tablet Take 1 tablet by mouth at bedtime as needed (sleep). Patient not taking: No sig reported    [provider]  hydrALAZINE (APRESOLINE) 50 MG tablet Take 1 tablet (50 mg total) by mouth 3 (three) times daily. 02/01/21   Roxan Hockey, MD  insulin glargine (LANTUS) 100 UNIT/ML injection Inject 10-15 Units into the skin at bedtime. Sliding scale over 200    [provider]  latanoprost (XALATAN) 0.005 % ophthalmic solution Place 2 drops into both eyes 2 (two) times daily. 08/27/16   [provider]  magnesium hydroxide (MILK OF MAGNESIA) 400 MG/5ML suspension Take 15 mLs by mouth daily as needed for mild constipation.    [provider]  meclizine (ANTIVERT) 25 MG tablet Take 1 tablet (25 mg total) by mouth 3 (three) times daily as needed for dizziness. Patient not taking: No sig reported 11/17/20   Barton Dubois, MD  minoxidil (LONITEN) 10 MG tablet Take 0.5 tablets (5 mg total) by mouth at bedtime. Patient does not take as prescribed. Sometimes takes a 1/2 tablet depending on BP readings 02/01/21   Roxan Hockey, MD  Multiple Vitamin (DAILY VITAMIN PO)  Take 1 tablet by mouth daily as needed (supplement).    [provider]  Omega-3 Fatty Acids (FISH OIL) 1000 MG CAPS Take 1,000 mg by mouth daily.    [provider]  OVER THE COUNTER MEDICATION Take 1 tablet by mouth daily. Beet Root    [provider]  sevelamer carbonate (RENVELA) 800 MG tablet 1 tablet with meals Patient not taking: No sig reported    [provider]  VELPHORO 500 MG chewable tablet Chew 1,500 mg by mouth 3 (three) times daily with meals. 01/30/21    [provider]      Allergies    Shellfish allergy    Review of Systems   Review of Systems  Constitutional:  Positive for fatigue. Negative for chills and fever.  Respiratory:  Positive for chest tightness and shortness of breath. Negative for cough.   Cardiovascular:  Negative for palpitations and leg swelling.  Gastrointestinal:  Negative for abdominal pain.  Musculoskeletal:  Negative for back pain.  Neurological:  Positive for weakness.  All other systems reviewed and are negative.   Physical Exam Updated Vital Signs BP (!) 161/80   Pulse 83   Temp 98.4 F (36.9 C) (Oral)   Resp 16   Ht '6\' 5"'$  (1.956 m)   SpO2 95%   BMI 22.61 kg/m  Physical Exam Vitals and nursing note reviewed.  Constitutional:      Appearance: Normal appearance.  HENT:     Head: Normocephalic and atraumatic.  Eyes:     Conjunctiva/sclera: Conjunctivae normal.  Cardiovascular:     Rate and Rhythm: Normal rate. Rhythm irregular.     Pulses:          Radial pulses are 2+ on the right side and 2+ on the left side.  Pulmonary:     Effort: Pulmonary effort is normal. No respiratory distress.     Breath sounds: Examination of the right-lower field reveals decreased breath sounds. Decreased breath sounds present.  Abdominal:     General: There is no distension.     Palpations: Abdomen is soft.     Tenderness: There is no abdominal tenderness.  Musculoskeletal:     Right lower leg: No edema.     Left lower leg: No edema.  Skin:    General: Skin is warm and dry.  Neurological:     General: No focal deficit present.     Mental Status: He is alert.     ED Results / Procedures / Treatments   Labs (all labs ordered are listed, but only abnormal results are displayed) Labs Reviewed  BASIC METABOLIC PANEL - Abnormal; Notable for the following components:      Result Value   Chloride 91 (*)    CO2 34 (*)    Glucose, Bld 251 (*)    Creatinine, Ser 4.34 (*)    GFR, Estimated 14 (*)     All other components within normal limits  CBC - Abnormal; Notable for the following components:   RBC 3.85 (*)    Hemoglobin 11.4 (*)    HCT 36.4 (*)    All other components within normal limits  BRAIN NATRIURETIC PEPTIDE - Abnormal; Notable for the following components:   B Natriuretic Peptide 944.0 (*)    All other components within normal limits  D-DIMER, QUANTITATIVE - Abnormal; Notable for the following components:   D-Dimer, Quant 6.99 (*)    All other components within normal limits  TROPONIN I (HIGH SENSITIVITY) - Abnormal;  Notable for the following components:   Troponin I (High Sensitivity) 130 (*)    All other components within normal limits  TROPONIN I (HIGH SENSITIVITY) - Abnormal; Notable for the following components:   Troponin I (High Sensitivity) 122 (*)    All other components within normal limits  HEPATIC FUNCTION PANEL    EKG EKG Interpretation  Date/Time:  Friday March 05 2022 13:59:15 EDT Ventricular Rate:  94 PR Interval:  230 QRS Duration: 172 QT Interval:  424 QTC Calculation: 530 R Axis:   118 Text Interpretation: Sinus rhythm with 1st degree A-V block with Premature atrial complexes Right bundle branch block Septal infarct , age undetermined T wave abnormality, consider inferior ischemia Abnormal ECG When compared with ECG of 01-Feb-2021 14:45, PREVIOUS ECG IS PRESENT Confirmed by Milton Ferguson [phone]) on 03/05/2022 3:17:57 PM  Radiology CT CHEST ABDOMEN PELVIS WO CONTRAST  Result Date: 03/05/2022 CLINICAL DATA:  Pleural effusion, shortness of breath, previous history of liver abscess. EXAM: CT CHEST, ABDOMEN AND PELVIS WITHOUT CONTRAST TECHNIQUE: Multidetector CT imaging of the chest, abdomen and pelvis was performed following the standard protocol without IV contrast. RADIATION DOSE REDUCTION: This exam was performed according to the departmental dose-optimization program which includes automated exposure control, adjustment of the mA and/or kV  according to patient size and/or use of iterative reconstruction technique. COMPARISON:  12/30/2020 FINDINGS: CT CHEST FINDINGS Cardiovascular: Heart is enlarged in size. Extensive coronary artery calcifications are seen. There is previous coronary bypass surgery. Mediastinum/Nodes: No significant lymphadenopathy is seen. Lungs/Pleura: There is large right pleural effusion. There are linear patchy infiltrates in right lung, more so in right lower lung field. There are small linear densities in left lower lobe. There is no left pleural effusion. There is no pneumothorax. Musculoskeletal: No acute findings are seen in bony structures in the thorax. There is encroachment of neural foramina by bony spurs in visualized lower cervical spine. CT ABDOMEN PELVIS FINDINGS Hepatobiliary: No focal abnormalities are seen in liver. There is no dilation of bile ducts. Gallbladder is unremarkable. Pancreas: Unremarkable. Spleen: Spleen measures 12.4 cm in maximum diameter. Adrenals/Urinary Tract: There is mild hyperplasia of adrenals. There is no hydronephrosis. There are multiple smooth marginated low-density lesions in renal cortex and parapelvic region seen in the kidneys. Largest of the cysts measures 3.8 cm in diameter. There are few small calcifications in both kidneys, most likely calcifications in renal artery branches. No definite renal or ureteral stones are seen. Urinary bladder is not distended. Stomach/Bowel: Stomach is unremarkable. Small bowel loops are not dilated. Appendix is not dilated. There is no significant wall thickening in colon. There is no pericolic stranding. Vascular/Lymphatic: Extensive arterial calcifications are seen. Reproductive: Prostate is enlarged projecting into the base of the bladder. Other: There is no ascites or pneumoperitoneum. Left inguinal hernia containing fat is seen. Musculoskeletal: No acute findings are seen in bony structures. IMPRESSION: Large right pleural effusion. Linear  patchy infiltrates are seen in both lungs, more so in right lower lobe suggesting atelectasis/pneumonia. Cardiomegaly. Coronary artery disease with previous coronary bypass surgery. There is no evidence of intestinal obstruction or pneumoperitoneum. Appendix is not dilated. There is no hydronephrosis. Multiple bilateral renal cysts. Small calcifications in both kidneys most likely suggest calcifications in renal artery branches. Other findings as described in the body of the report. Electronically Signed   By: Elmer Picker M.D.   On: 03/05/2022 17:32   DG Chest 2 View  Result Date: 03/05/2022 CLINICAL DATA:  Bilateral leg weakness with shortness  of breath and chest tightness for 4 weeks. EXAM: CHEST - 2 VIEW COMPARISON:  Radiographs 02/01/2021, 12/14/2020 and 12/04/2020. Abdominal CT 12/30/2020. FINDINGS: Persistent moderate size sub pulmonic right pleural effusion with associated right basilar pulmonary opacity, similar to the most recent prior study. No significant pleural fluid on the on left. The left lung is clear. There is no pneumothorax. The heart size and mediastinal contours are stable post CABG. IMPRESSION: Persistent moderate-sized sub pulmonic right pleural effusion with associated right basilar pulmonary opacity. Given the recent history of liver abscesses, follow-up CT should be considered. Electronically Signed   By: Richardean Sale M.D.   On: 03/05/2022 14:34    Procedures Procedures    Medications Ordered in ED Medications  aspirin chewable tablet 324 mg (324 mg Oral Given 03/05/22 1531)    ED Course/ Medical Decision Making/ A&P                           Medical Decision Making Amount and/or Complexity of Data Reviewed Labs: ordered. Radiology: ordered.  Risk OTC drugs.  This patient is a 71 y.o. male  who presents to the ED for concern of shortness of breath x 4 weeks.   Differential diagnoses prior to evaluation: The emergent differential diagnosis includes,  but is not limited to,  CHF, pericardial effusion/tamponade, arrhythmias, ACS, COPD, asthma, bronchitis, pneumonia, pneumothorax, PE, anemia, neuromuscular disorder. This is not an exhaustive differential.   Past Medical History / Co-morbidities: CAD, ESRD on dialysis, type 2 diabetes, hypertension, and diastolic heart failure  Additional history: Chart reviewed. Pertinent results include: Reviewed prior x-rays and appears patient has had trace pleural effusions, R>L since June 2022. Previously had liver lesions concerning for malignancy, but collections were drained and consistent with abscesses.   Physical Exam: Physical exam performed. The pertinent findings include: Hypertensive but otherwise normal vital signs. No increased work of breathing at rest. Decreased breath sounds on the right lower lobe. Normal oxygen saturation on room air. Does not appear fluid overloaded.   Lab Tests/Imaging studies: I personally interpreted labs/imaging and the pertinent results include: CBC unremarkable.  Glucose 251.  Creatinine 4.34, consistent with ESRD.  Normal hepatic function.  Initial troponin 130, delta troponin 122.  D-dimer 6.99, BNP 944.  Both elevated likely in the setting of ESRD and CHF.  Chest x-ray with persistent moderate-sized right pleural effusion and associated right basilar pulmonary opacity.  Follow-up CT chest/abdomen/pelvis shows large right pleural effusion with linear patchy infiltrates in both lungs.  No liver lesions.  I agree with the radiologist interpretation.  Cardiac monitoring: EKG obtained and interpreted by my attending physician which shows: Sinus rhythm with first-degree AV block with PACs and right bundle branch block   Medications: I ordered medication including aspirin.  I have reviewed the patients home medicines and have made adjustments as needed.  Disposition: Patient discussed and care transferred to attending physician Dr. Roderic Palau at shift change. Please see  his/her note for further details regarding further ED course and disposition. Plan at time of handoff is consult pulmonology/critical care to discuss patient disposition.   Final Clinical Impression(s) / ED Diagnoses Final diagnoses:  Shortness of breath  Pleural effusion    Rx / DC Orders ED Discharge Orders     None      Portions of this report may have been transcribed using voice recognition software. Every effort was made to ensure accuracy; however, inadvertent computerized transcription errors may be present.  Estill Cotta 03/05/22 1914    Milton Ferguson, MD 03/06/22 1125

## 2022-03-05 NOTE — Telephone Encounter (Signed)
[facility] desk   Call from Dr Joelene Millin of [facility] - PATient [name] March 19, 1951  with [place] [phone] Has slow growing Right pleural effusion x 12-14 months. ER wants opd followup  Plan  - arrange opd followup with Pulmonary in [facility] or [place] in < 2 weeks . HE can even start with a virtual consult to get his thora ordered -> and post thora CT chest

## 2022-03-05 NOTE — ED Notes (Addendum)
EDP at [facility]. PT alert, NAD, calm, interactive. Speaking clearly. Endorses a little short winded. No increased wob.

## 2022-03-05 NOTE — ED Triage Notes (Addendum)
Pt in c/o bil leg weakness, SOB with ambulation and tightness in his chest onset x 4 wks, pt denies n/v/d, speaks in full sentences, NAD, pt takes HD and reports getting a full tx today, A&O x4

## 2022-03-05 NOTE — ED Notes (Signed)
C/o fatigue, denies pain, nausea, sob, fever, cough, or unilateral sx or weakness. HD today, full treatment. L upper arm AVG with positive bruit/ thrill.

## 2022-03-08 DIAGNOSIS — Z992 Dependence on renal dialysis: Secondary | ICD-10-CM | POA: Diagnosis not present

## 2022-03-08 DIAGNOSIS — E119 Type 2 diabetes mellitus without complications: Secondary | ICD-10-CM | POA: Diagnosis not present

## 2022-03-08 DIAGNOSIS — N186 End stage renal disease: Secondary | ICD-10-CM | POA: Diagnosis not present

## 2022-03-08 DIAGNOSIS — D509 Iron deficiency anemia, unspecified: Secondary | ICD-10-CM | POA: Diagnosis not present

## 2022-03-08 DIAGNOSIS — N2581 Secondary hyperparathyroidism of renal origin: Secondary | ICD-10-CM | POA: Diagnosis not present

## 2022-03-08 DIAGNOSIS — D689 Coagulation defect, unspecified: Secondary | ICD-10-CM | POA: Diagnosis not present

## 2022-03-09 NOTE — Telephone Encounter (Signed)
11 x 8 2023 2023 is too far out.  I recommend a virtual consult with one of the doctors either in [place] but through virtual method.  Therefore a thoracentesis can be ordered.  Even a nurse practitioner can start the consult virtually if he cannot come down to [facility]

## 2022-03-09 NOTE — Telephone Encounter (Signed)
Patient scheduled first available in [place] office 04/21/2022 per patient preference.

## 2022-03-10 DIAGNOSIS — Z992 Dependence on renal dialysis: Secondary | ICD-10-CM | POA: Diagnosis not present

## 2022-03-10 DIAGNOSIS — N2581 Secondary hyperparathyroidism of renal origin: Secondary | ICD-10-CM | POA: Diagnosis not present

## 2022-03-10 DIAGNOSIS — D689 Coagulation defect, unspecified: Secondary | ICD-10-CM | POA: Diagnosis not present

## 2022-03-10 DIAGNOSIS — N186 End stage renal disease: Secondary | ICD-10-CM | POA: Diagnosis not present

## 2022-03-10 DIAGNOSIS — E119 Type 2 diabetes mellitus without complications: Secondary | ICD-10-CM | POA: Diagnosis not present

## 2022-03-10 DIAGNOSIS — D509 Iron deficiency anemia, unspecified: Secondary | ICD-10-CM | POA: Diagnosis not present

## 2022-03-12 DIAGNOSIS — N186 End stage renal disease: Secondary | ICD-10-CM | POA: Diagnosis not present

## 2022-03-12 DIAGNOSIS — D689 Coagulation defect, unspecified: Secondary | ICD-10-CM | POA: Diagnosis not present

## 2022-03-12 DIAGNOSIS — D509 Iron deficiency anemia, unspecified: Secondary | ICD-10-CM | POA: Diagnosis not present

## 2022-03-12 DIAGNOSIS — Z992 Dependence on renal dialysis: Secondary | ICD-10-CM | POA: Diagnosis not present

## 2022-03-12 DIAGNOSIS — E119 Type 2 diabetes mellitus without complications: Secondary | ICD-10-CM | POA: Diagnosis not present

## 2022-03-12 DIAGNOSIS — N2581 Secondary hyperparathyroidism of renal origin: Secondary | ICD-10-CM | POA: Diagnosis not present

## 2022-03-12 NOTE — Telephone Encounter (Signed)
Thanks

## 2022-03-12 NOTE — Telephone Encounter (Signed)
Patient is scheduled in-person with Derl Barrow, NP on 03/16/2022 at 9:30am in [place]- patient is aware of address. 11/8 appt in [place] is still on the books in case needed for follow up.

## 2022-03-13 DIAGNOSIS — N186 End stage renal disease: Secondary | ICD-10-CM | POA: Diagnosis not present

## 2022-03-13 DIAGNOSIS — Z992 Dependence on renal dialysis: Secondary | ICD-10-CM | POA: Diagnosis not present

## 2022-03-15 DIAGNOSIS — N186 End stage renal disease: Secondary | ICD-10-CM | POA: Diagnosis not present

## 2022-03-15 DIAGNOSIS — D631 Anemia in chronic kidney disease: Secondary | ICD-10-CM | POA: Diagnosis not present

## 2022-03-15 DIAGNOSIS — N2581 Secondary hyperparathyroidism of renal origin: Secondary | ICD-10-CM | POA: Diagnosis not present

## 2022-03-15 DIAGNOSIS — Z992 Dependence on renal dialysis: Secondary | ICD-10-CM | POA: Diagnosis not present

## 2022-03-15 DIAGNOSIS — Z23 Encounter for immunization: Secondary | ICD-10-CM | POA: Diagnosis not present

## 2022-03-15 DIAGNOSIS — D689 Coagulation defect, unspecified: Secondary | ICD-10-CM | POA: Diagnosis not present

## 2022-03-15 DIAGNOSIS — D509 Iron deficiency anemia, unspecified: Secondary | ICD-10-CM | POA: Diagnosis not present

## 2022-03-15 DIAGNOSIS — D696 Thrombocytopenia, unspecified: Secondary | ICD-10-CM | POA: Diagnosis not present

## 2022-03-16 ENCOUNTER — Encounter: Payer: Self-pay | Admitting: Internal Medicine

## 2022-03-16 ENCOUNTER — Ambulatory Visit (INDEPENDENT_AMBULATORY_CARE_PROVIDER_SITE_OTHER): Payer: Medicare Other | Admitting: Internal Medicine

## 2022-03-16 ENCOUNTER — Telehealth: Payer: Self-pay | Admitting: Internal Medicine

## 2022-03-16 VITALS — BP 146/76 | HR 79 | Temp 98.3°F | Ht 77.0 in | Wt 192.0 lb

## 2022-03-16 DIAGNOSIS — J90 Pleural effusion, not elsewhere classified: Secondary | ICD-10-CM | POA: Diagnosis not present

## 2022-03-16 DIAGNOSIS — N186 End stage renal disease: Secondary | ICD-10-CM

## 2022-03-16 DIAGNOSIS — R0609 Other forms of dyspnea: Secondary | ICD-10-CM | POA: Diagnosis not present

## 2022-03-16 LAB — CBC WITH DIFFERENTIAL/PLATELET
Basophils Absolute: 0.1 10*3/uL (ref 0.0–0.1)
Basophils Relative: 1.2 % (ref 0.0–3.0)
Eosinophils Absolute: 0.2 10*3/uL (ref 0.0–0.7)
Eosinophils Relative: 3.8 % (ref 0.0–5.0)
HCT: 32.6 % — ABNORMAL LOW (ref 39.0–52.0)
Hemoglobin: 10.8 g/dL — ABNORMAL LOW (ref 13.0–17.0)
Lymphocytes Relative: 16.7 % (ref 12.0–46.0)
Lymphs Abs: 0.9 10*3/uL (ref 0.7–4.0)
MCHC: 33.2 g/dL (ref 30.0–36.0)
MCV: 92.2 fl (ref 78.0–100.0)
Monocytes Absolute: 0.6 10*3/uL (ref 0.1–1.0)
Monocytes Relative: 11 % (ref 3.0–12.0)
Neutro Abs: 3.6 10*3/uL (ref 1.4–7.7)
Neutrophils Relative %: 67.3 % (ref 43.0–77.0)
Platelets: 254 10*3/uL (ref 150.0–400.0)
RBC: 3.54 Mil/uL — ABNORMAL LOW (ref 4.22–5.81)
RDW: 16.9 % — ABNORMAL HIGH (ref 11.5–15.5)
WBC: 5.3 10*3/uL (ref 4.0–10.5)

## 2022-03-16 LAB — TRIGLYCERIDES: Triglycerides: 201 mg/dL — ABNORMAL HIGH (ref 0.0–149.0)

## 2022-03-16 LAB — GLUCOSE, RANDOM: Glucose, Bld: 186 mg/dL — ABNORMAL HIGH (ref 70–99)

## 2022-03-16 LAB — ALBUMIN: Albumin: 4.1 g/dL (ref 3.5–5.2)

## 2022-03-16 LAB — PROTEIN, TOTAL: Total Protein: 7.7 g/dL (ref 6.0–8.3)

## 2022-03-16 LAB — LIPASE: Lipase: 36 U/L (ref 11.0–59.0)

## 2022-03-16 NOTE — Patient Instructions (Addendum)
ICD-10-CM   1. Pleural effusion on right  J90     2. ESRD (end stage renal disease) ([facility])  N18.6     3. DOE (dyspnea on exertion)  R06.09       This chronic right-sided fluid collection called pleural effusion  -You are from [place] and he preferred to be followed at [place].  Plan - do blood work - : cbc, ldh, protein, albumin glucose, and lipase, Triglyceride and Quantiferon Gold - Do thoracentesis -The fluid removal will be done by Interventional radiology  - will aim to get it done at [facility]    - they will remove not more than 1.5L    fluid labs to be sent: cell count, gram stain and culture, cytology for malignant cells, chemistries for LDH, albumin,  Protein, glucose, triglyceride and lipase  -After the thoracentesis do CT chest with and without contrast  - eCHO first availble (last 2016)  Followup  - Keep appointment with Dr. Christinia Gully in [place] on April 21, 2022 to discuss test results

## 2022-03-16 NOTE — Telephone Encounter (Signed)
Attempted to inform this pt of his [facility] for next week. No answer and unable to leave vm. Pt is not set up on mychart, will attempt to call again tomorrow.

## 2022-03-16 NOTE — Progress Notes (Signed)
OV 03/16/2022  Subjective:  Patient ID: [name], male , DOB: September 05, 1950 , age 71 y.o. , MRN: [id number] , ADDRESS: [id number] [facility] [id number] PCP [facility] Patient Care Team: [facility] as PCP - General Fran Lowes, MD (Inactive) as Consulting Physician (Nephrology)  This Provider for this visit: Treatment Team:  Attending Provider: Brand Males, MD Nurse Practitioner: Martyn Ehrich, NP    03/16/2022 -   Chief Complaint  Patient presents with   Hospital Followup     SOB walking to mailbox- approx 30 yards and walking up an incline. He has noticed minimal wheezing.      HPI [name] 71 y.o. -presents with his daughter-in-law Crystal for new evaluation.  I got a call from the emergency department end of September 2023 because he had pleural effusion.  Review of the chest x-rays and visualization of that shows that he had pleural effusion for at least over a year.  This is now increased.  He lives in [place] and prefers to be followed up in [place] but the next earliest appointment was early November 2023 with Dr. Christinia Gully.  Therefore he decided to come here for the initial evaluation.  After this evaluation he prefers to be seen in [place].  He does not want to come to [facility] although the daughter-in-law states that if needed they can always bring him to [facility].  Nevertheless he prefers to be seen in [place] and have asked him to keep the appointment early November 2023 with Dr. Christinia Gully.  At this point in time he tells me for the last 3 to 4 months he has had insidious onset of shortness of breath with stairs and walking it is getting better it was at its worst 3 weeks ago nevertheless it still persistent.  There is no weight loss there is no fever there is no chills.  The worsening shortness of breath correlates with a worsening pleural effusion.  He has no  lupus no rheumatoid arthritis no cancer he was never a smoker although he did chew tobacco and quit 8 years ago.  He has been on dialysis at [facility] Monday Wednesday Friday for 8 years he has a left arm fistula.  He does have orthostatic hypotension.  He is not sure what his dry weight is and he does not know the efficacy of volume removal in the dialysis suite.  Review of the records indicate last echocardiogram was in 2016 with ejection fraction 55-60%.  He has previous history of coronary artery disease and bypass from several years ago.  He has a keloid scar in the central chest.  CT Chest data  No results found.    PFT     Latest Ref Rng & Units 04/02/2015    4:49 PM  PFT Results  FVC-Pre L 3.84   FVC-Predicted Pre % 75   Pre FEV1/FVC % % 76   FEV1-Pre L 2.91   FEV1-Predicted Pre % 74   TLC L 9.87   TLC % Predicted % 117   RV % Predicted % 223        has a past medical history of Arthritis, CAD (coronary artery disease), Dependence on renal dialysis ([place]), Depression, Diabetes mellitus without complication ([place]), History of kidney stones, Hypertension, Kidney infection, MI (mitral incompetence), Myocardial infarction ([place]), and Seasonal allergies.   reports that he has never smoked. He quit smokeless tobacco use about 11  years ago.  His smokeless tobacco use included chew.  Past Surgical History:  Procedure Laterality Date   AV FISTULA PLACEMENT Left 01/30/2014   Procedure: INSERTION OF ARTERIOVENOUS (AV) GORE-TEX GRAFT LEFT UPPER ARM;  Surgeon: Elam Dutch, MD;  Location: [facility];  Service: Vascular;  Laterality: Left;   BACK SURGERY     CARDIAC CATHETERIZATION N/A 04/01/2015   Procedure: Right/Left Heart Cath and Coronary Angiography;  Surgeon: Leonie Man, MD;  Location: [facility];  Service: Cardiovascular;  Laterality: N/A;   COLONOSCOPY N/A 10/12/2016   Procedure: COLONOSCOPY;  Surgeon: Daneil Dolin, MD;  [facility];  Service:  Endoscopy;  Laterality: N/A;  2:00pm   COLONOSCOPY W/ POLYPECTOMY     CORONARY ANGIOPLASTY WITH STENT PLACEMENT  2008   CORONARY ARTERY BYPASS GRAFT N/A 04/03/2015   Procedure: CORONARY ARTERY BYPASS GRAFTING (CABG) x  4 (LIMA to LAD, SVG to DIAGONAL, SVG to OM1, and SVG to RCA) with EVH from right greater saphenous thigh and partial lower leg vein ;  Surgeon: Ivin Poot, MD;  Location: [facility];  Service: Open Heart Surgery;  Laterality: N/A;   EYE SURGERY Right    lens implant   INSERTION OF DIALYSIS CATHETER Right 01/30/2014   Procedure: INSERTION OF DIALYSIS CATHETER-RIGHT INTERNAL JUGULAR;  Surgeon: Elam Dutch, MD;  Location: [facility];  Service: Vascular;  Laterality: Right;   IR RADIOLOGIST EVAL & MGMT  12/30/2020   IR RADIOLOGIST EVAL & MGMT  01/08/2021   POLYPECTOMY  10/12/2016   Procedure: POLYPECTOMY;  Surgeon: Daneil Dolin, MD;  [facility];  Service: Endoscopy;;  colon   SPINE SURGERY     TEE WITHOUT CARDIOVERSION N/A 04/03/2015   Procedure: TRANSESOPHAGEAL ECHOCARDIOGRAM (TEE);  Surgeon: Ivin Poot, MD;  Location: [facility];  Service: Open Heart Surgery;  Laterality: N/A;    Allergies  Allergen Reactions   Shellfish Allergy Hives    Immunization History  Administered Date(s) Administered   Influenza, High Dose Seasonal PF 03/14/2020, 03/09/2021   Moderna Sars-Covid-2 Vaccination 08/23/2019, 09/20/2019, 06/03/2020   Pneumococcal Conjugate-13 04/26/2018   Pneumococcal Polysaccharide-23 01/16/2014, 07/26/2018    Family History  Problem Relation Age of Onset   Diabetes Mother    Hypertension Mother    Diabetes Father    Hypertension Father    Diabetes Other      Current Outpatient Medications:    acetaminophen (TYLENOL) 500 MG tablet, Take 325 mg by mouth every 6 (six) hours as needed for mild pain or moderate pain., Disp: , Rfl:    amitriptyline (ELAVIL) 25 MG tablet, Take 25 mg by mouth at bedtime., Disp: , Rfl:    aspirin EC 325 MG tablet, Take  '325mg'$ s once daily on non dialysis days. Dialysis days are Mon, Wed, Fri, Disp: , Rfl:    atorvastatin (LIPITOR) 80 MG tablet, Take 1 tablet (80 mg total) by mouth daily at 6 PM., Disp: 30 tablet, Rfl: 0   carvedilol (COREG) 25 MG tablet, Take 1 tablet (25 mg total) by mouth 2 (two) times daily., Disp: 60 tablet, Rfl: 5   cloNIDine (CATAPRES) 0.2 MG tablet, Take 1 tablet (0.2 mg total) by mouth 2 (two) times daily., Disp: 60 tablet, Rfl: 5   diphenhydramine-acetaminophen (TYLENOL PM) 25-500 MG TABS tablet, Take 1 tablet by mouth at bedtime as needed (sleep)., Disp: , Rfl:    hydrALAZINE (APRESOLINE) 50 MG tablet, Take 1 tablet (50 mg total) by mouth 3 (three) times  daily., Disp: 90 tablet, Rfl: 5   insulin glargine (LANTUS) 100 UNIT/ML injection, Inject 10-15 Units into the skin at bedtime. Sliding scale over 200, Disp: , Rfl:    latanoprost (XALATAN) 0.005 % ophthalmic solution, Place 2 drops into both eyes 2 (two) times daily., Disp: , Rfl:    minoxidil (LONITEN) 10 MG tablet, Take 0.5 tablets (5 mg total) by mouth at bedtime. Patient does not take as prescribed. Sometimes takes a 1/2 tablet depending on BP readings, Disp: 30 tablet, Rfl: 5   Multiple Vitamin (DAILY VITAMIN PO), Take 1 tablet by mouth daily as needed (supplement)., Disp: , Rfl:    Omega-3 Fatty Acids (FISH OIL) 1000 MG CAPS, Take 1,000 mg by mouth daily., Disp: , Rfl:    OVER THE COUNTER MEDICATION, Take 1 tablet by mouth daily. Beet Root, Disp: , Rfl:    sevelamer carbonate (RENVELA) 800 MG tablet, 1 tablet with meals, Disp: , Rfl:       Objective:   Vitals:   03/16/22 0927  BP: (!) 146/76  Pulse: 79  Temp: 98.3 F (36.8 C)  TempSrc: Oral  SpO2: 95%  Weight: 192 lb (87.1 kg)  Height: '6\' 5"'$  (1.956 m)    Estimated body mass index is 22.77 kg/m as calculated from the following:   Height as of this encounter: '6\' 5"'$  (1.956 m).   Weight as of this encounter: 192 lb (87.1 kg).  '@WEIGHTCHANGE'$ @  [employer]    03/16/22 0927  Weight: 192 lb (87.1 kg)     Physical Exam    General: No distress.  Tall thin male. Neuro: Alert and Oriented x 3. GCS 15. Speech normal Psych: Pleasant Resp:  Barrel Chest - no.  Wheeze - no, Crackles - no, No overt respiratory distress CVS: Normal heart sounds. Murmurs - no Ext: Stigmata of Connective Tissue Disease - no.  Left upper extremity fistula. HEENT: Normal upper airway. PEERL +. No post nasal drip        Assessment:       ICD-10-CM   1. Pleural effusion on right  J90     2. ESRD (end stage renal disease) ([place])  N18.6     3. DOE (dyspnea on exertion)  R06.09      Typically these are chronic renal failure related effusions.  Cardiac issues can also supervene.  And others need to rule out any chronic infections or    Plan:     Patient Instructions     ICD-10-CM   1. Pleural effusion on right  J90     2. ESRD (end stage renal disease) ([place])  N18.6     3. DOE (dyspnea on exertion)  R06.09       This chronic right-sided fluid collection called pleural effusion  -You are from [place] and he preferred to be followed at [place].  Plan - do blood work - : cbc, ldh, protein, albumin glucose, and lipase, Triglyceride and Quantiferon Gold - Do thoracentesis -The fluid removal will be done by Interventional radiology  - will aim to get it done at [facility]    - they will remove not more than 1.5L    fluid labs to be sent: cell count, gram stain and culture, cytology for malignant cells, chemistries for LDH, albumin,  Protein, glucose, triglyceride and lipase  -After the thoracentesis do CT chest with and without contrast  - eCHO first availble (last 2016)  Followup  - Keep appointment with Dr. Christinia Gully in [place] on April 21, 2022 to discuss test results      SIGNATURE    Dr. Brand Males, M.D., F.C.C.P,  Pulmonary and Critical Care Medicine Staff Physician, [place] Director - Interstitial  Lung Disease  Program  [facility] [place], [place], [address]  Pager: [phone], If no answer or between  15:00h - 7:00h: call [phone] Telephone: [phone]  10:09 AM 03/16/2022

## 2022-03-17 DIAGNOSIS — N2581 Secondary hyperparathyroidism of renal origin: Secondary | ICD-10-CM | POA: Diagnosis not present

## 2022-03-17 DIAGNOSIS — D689 Coagulation defect, unspecified: Secondary | ICD-10-CM | POA: Diagnosis not present

## 2022-03-17 DIAGNOSIS — Z992 Dependence on renal dialysis: Secondary | ICD-10-CM | POA: Diagnosis not present

## 2022-03-17 DIAGNOSIS — D509 Iron deficiency anemia, unspecified: Secondary | ICD-10-CM | POA: Diagnosis not present

## 2022-03-17 DIAGNOSIS — D696 Thrombocytopenia, unspecified: Secondary | ICD-10-CM | POA: Diagnosis not present

## 2022-03-17 DIAGNOSIS — D631 Anemia in chronic kidney disease: Secondary | ICD-10-CM | POA: Diagnosis not present

## 2022-03-17 DIAGNOSIS — Z23 Encounter for immunization: Secondary | ICD-10-CM | POA: Diagnosis not present

## 2022-03-17 DIAGNOSIS — N186 End stage renal disease: Secondary | ICD-10-CM | POA: Diagnosis not present

## 2022-03-17 LAB — LACTATE DEHYDROGENASE: LDH: 152 U/L (ref 120–250)

## 2022-03-19 DIAGNOSIS — Z992 Dependence on renal dialysis: Secondary | ICD-10-CM | POA: Diagnosis not present

## 2022-03-19 DIAGNOSIS — D696 Thrombocytopenia, unspecified: Secondary | ICD-10-CM | POA: Diagnosis not present

## 2022-03-19 DIAGNOSIS — D509 Iron deficiency anemia, unspecified: Secondary | ICD-10-CM | POA: Diagnosis not present

## 2022-03-19 DIAGNOSIS — Z23 Encounter for immunization: Secondary | ICD-10-CM | POA: Diagnosis not present

## 2022-03-19 DIAGNOSIS — D631 Anemia in chronic kidney disease: Secondary | ICD-10-CM | POA: Diagnosis not present

## 2022-03-19 DIAGNOSIS — D689 Coagulation defect, unspecified: Secondary | ICD-10-CM | POA: Diagnosis not present

## 2022-03-19 DIAGNOSIS — N2581 Secondary hyperparathyroidism of renal origin: Secondary | ICD-10-CM | POA: Diagnosis not present

## 2022-03-19 DIAGNOSIS — N186 End stage renal disease: Secondary | ICD-10-CM | POA: Diagnosis not present

## 2022-03-20 LAB — QUANTIFERON-TB GOLD PLUS
Mitogen-NIL: 4.58 IU/mL
NIL: 0.05 IU/mL
QuantiFERON-TB Gold Plus: NEGATIVE
TB1-NIL: <0 IU/mL
TB2-NIL: <0 IU/mL

## 2022-03-22 DIAGNOSIS — D689 Coagulation defect, unspecified: Secondary | ICD-10-CM | POA: Diagnosis not present

## 2022-03-22 DIAGNOSIS — N186 End stage renal disease: Secondary | ICD-10-CM | POA: Diagnosis not present

## 2022-03-22 DIAGNOSIS — D631 Anemia in chronic kidney disease: Secondary | ICD-10-CM | POA: Diagnosis not present

## 2022-03-22 DIAGNOSIS — Z23 Encounter for immunization: Secondary | ICD-10-CM | POA: Diagnosis not present

## 2022-03-22 DIAGNOSIS — N2581 Secondary hyperparathyroidism of renal origin: Secondary | ICD-10-CM | POA: Diagnosis not present

## 2022-03-22 DIAGNOSIS — Z992 Dependence on renal dialysis: Secondary | ICD-10-CM | POA: Diagnosis not present

## 2022-03-22 DIAGNOSIS — D509 Iron deficiency anemia, unspecified: Secondary | ICD-10-CM | POA: Diagnosis not present

## 2022-03-22 DIAGNOSIS — D696 Thrombocytopenia, unspecified: Secondary | ICD-10-CM | POA: Diagnosis not present

## 2022-03-24 ENCOUNTER — Ambulatory Visit (HOSPITAL_COMMUNITY): Admission: RE | Admit: 2022-03-24 | Payer: Medicare Other | Source: Ambulatory Visit

## 2022-03-24 DIAGNOSIS — D631 Anemia in chronic kidney disease: Secondary | ICD-10-CM | POA: Diagnosis not present

## 2022-03-24 DIAGNOSIS — D509 Iron deficiency anemia, unspecified: Secondary | ICD-10-CM | POA: Diagnosis not present

## 2022-03-24 DIAGNOSIS — N186 End stage renal disease: Secondary | ICD-10-CM | POA: Diagnosis not present

## 2022-03-24 DIAGNOSIS — N2581 Secondary hyperparathyroidism of renal origin: Secondary | ICD-10-CM | POA: Diagnosis not present

## 2022-03-24 DIAGNOSIS — D696 Thrombocytopenia, unspecified: Secondary | ICD-10-CM | POA: Diagnosis not present

## 2022-03-24 DIAGNOSIS — Z992 Dependence on renal dialysis: Secondary | ICD-10-CM | POA: Diagnosis not present

## 2022-03-24 DIAGNOSIS — Z23 Encounter for immunization: Secondary | ICD-10-CM | POA: Diagnosis not present

## 2022-03-24 DIAGNOSIS — D689 Coagulation defect, unspecified: Secondary | ICD-10-CM | POA: Diagnosis not present

## 2022-03-26 ENCOUNTER — Ambulatory Visit (HOSPITAL_COMMUNITY)
Admission: RE | Admit: 2022-03-26 | Discharge: 2022-03-26 | Disposition: A | Discharge status: Home / Self Care | Payer: Medicare Other | Source: Ambulatory Visit | Attending: Radiology | Admitting: Radiology

## 2022-03-26 ENCOUNTER — Other Ambulatory Visit (HOSPITAL_COMMUNITY): Payer: Self-pay | Admitting: Radiology

## 2022-03-26 ENCOUNTER — Ambulatory Visit (HOSPITAL_COMMUNITY)
Admission: RE | Admit: 2022-03-26 | Discharge: 2022-03-26 | Disposition: A | Discharge status: Home / Self Care | Payer: Medicare Other | Source: Ambulatory Visit | Attending: Internal Medicine | Admitting: Internal Medicine

## 2022-03-26 DIAGNOSIS — N186 End stage renal disease: Secondary | ICD-10-CM | POA: Insufficient documentation

## 2022-03-26 DIAGNOSIS — J90 Pleural effusion, not elsewhere classified: Secondary | ICD-10-CM | POA: Diagnosis not present

## 2022-03-26 DIAGNOSIS — I2510 Atherosclerotic heart disease of native coronary artery without angina pectoris: Secondary | ICD-10-CM | POA: Insufficient documentation

## 2022-03-26 DIAGNOSIS — I700 Atherosclerosis of aorta: Secondary | ICD-10-CM | POA: Insufficient documentation

## 2022-03-26 DIAGNOSIS — J984 Other disorders of lung: Secondary | ICD-10-CM | POA: Insufficient documentation

## 2022-03-26 DIAGNOSIS — I1311 Hypertensive heart and chronic kidney disease without heart failure, with stage 5 chronic kidney disease, or end stage renal disease: Secondary | ICD-10-CM | POA: Insufficient documentation

## 2022-03-26 DIAGNOSIS — R0609 Other forms of dyspnea: Secondary | ICD-10-CM

## 2022-03-26 DIAGNOSIS — Z951 Presence of aortocoronary bypass graft: Secondary | ICD-10-CM | POA: Diagnosis not present

## 2022-03-26 DIAGNOSIS — Z992 Dependence on renal dialysis: Secondary | ICD-10-CM | POA: Insufficient documentation

## 2022-03-26 DIAGNOSIS — J948 Other specified pleural conditions: Secondary | ICD-10-CM | POA: Diagnosis not present

## 2022-03-26 HISTORY — PX: IR THORACENTESIS ASP PLEURAL SPACE W/IMG GUIDE: IMG5380

## 2022-03-26 LAB — PROTEIN, PLEURAL OR PERITONEAL FLUID: Total protein, fluid: 4.6 g/dL

## 2022-03-26 LAB — ALBUMIN, PLEURAL OR PERITONEAL FLUID: Albumin, Fluid: 2.2 g/dL

## 2022-03-26 LAB — LACTATE DEHYDROGENASE, PLEURAL OR PERITONEAL FLUID: LD, Fluid: 189 U/L — ABNORMAL HIGH (ref 3–23)

## 2022-03-26 LAB — GLUCOSE, PLEURAL OR PERITONEAL FLUID: Glucose, Fluid: 117 mg/dL

## 2022-03-26 MED ORDER — IOHEXOL 350 MG/ML SOLN
75.0000 mL | Freq: Once | INTRAVENOUS | Status: AC | PRN
  Administered 2022-03-26: 75 mL via INTRAVENOUS

## 2022-03-26 MED ORDER — LIDOCAINE HCL 1 % IJ SOLN
INTRAMUSCULAR | Status: AC
  Filled 2022-03-26: qty 20

## 2022-03-26 NOTE — Procedures (Signed)
Ultrasound-guided diagnostic and therapeutic right sided thoracentesis performed yielding 900 milliliters of amber colored fluid. No immediate complications.   Diagnostic fluid was sent to the lab for further analysis. Follow-up chest x-ray pending. EBL is < 2 ml.

## 2022-03-27 LAB — TRIGLYCERIDES, BODY FLUIDS: Triglycerides, Fluid: 38 mg/dL

## 2022-03-29 DIAGNOSIS — D631 Anemia in chronic kidney disease: Secondary | ICD-10-CM | POA: Diagnosis not present

## 2022-03-29 DIAGNOSIS — D689 Coagulation defect, unspecified: Secondary | ICD-10-CM | POA: Diagnosis not present

## 2022-03-29 DIAGNOSIS — D509 Iron deficiency anemia, unspecified: Secondary | ICD-10-CM | POA: Diagnosis not present

## 2022-03-29 DIAGNOSIS — N2581 Secondary hyperparathyroidism of renal origin: Secondary | ICD-10-CM | POA: Diagnosis not present

## 2022-03-29 DIAGNOSIS — N186 End stage renal disease: Secondary | ICD-10-CM | POA: Diagnosis not present

## 2022-03-29 DIAGNOSIS — D696 Thrombocytopenia, unspecified: Secondary | ICD-10-CM | POA: Diagnosis not present

## 2022-03-29 DIAGNOSIS — Z23 Encounter for immunization: Secondary | ICD-10-CM | POA: Diagnosis not present

## 2022-03-29 DIAGNOSIS — Z992 Dependence on renal dialysis: Secondary | ICD-10-CM | POA: Diagnosis not present

## 2022-03-30 LAB — CYTOLOGY - NON PAP

## 2022-03-31 DIAGNOSIS — N2581 Secondary hyperparathyroidism of renal origin: Secondary | ICD-10-CM | POA: Diagnosis not present

## 2022-03-31 DIAGNOSIS — Z23 Encounter for immunization: Secondary | ICD-10-CM | POA: Diagnosis not present

## 2022-03-31 DIAGNOSIS — D689 Coagulation defect, unspecified: Secondary | ICD-10-CM | POA: Diagnosis not present

## 2022-03-31 DIAGNOSIS — D631 Anemia in chronic kidney disease: Secondary | ICD-10-CM | POA: Diagnosis not present

## 2022-03-31 DIAGNOSIS — Z992 Dependence on renal dialysis: Secondary | ICD-10-CM | POA: Diagnosis not present

## 2022-03-31 DIAGNOSIS — D696 Thrombocytopenia, unspecified: Secondary | ICD-10-CM | POA: Diagnosis not present

## 2022-03-31 DIAGNOSIS — N186 End stage renal disease: Secondary | ICD-10-CM | POA: Diagnosis not present

## 2022-03-31 DIAGNOSIS — D509 Iron deficiency anemia, unspecified: Secondary | ICD-10-CM | POA: Diagnosis not present

## 2022-04-02 DIAGNOSIS — N2581 Secondary hyperparathyroidism of renal origin: Secondary | ICD-10-CM | POA: Diagnosis not present

## 2022-04-02 DIAGNOSIS — N186 End stage renal disease: Secondary | ICD-10-CM | POA: Diagnosis not present

## 2022-04-02 DIAGNOSIS — D689 Coagulation defect, unspecified: Secondary | ICD-10-CM | POA: Diagnosis not present

## 2022-04-02 DIAGNOSIS — Z992 Dependence on renal dialysis: Secondary | ICD-10-CM | POA: Diagnosis not present

## 2022-04-02 DIAGNOSIS — Z23 Encounter for immunization: Secondary | ICD-10-CM | POA: Diagnosis not present

## 2022-04-02 DIAGNOSIS — D509 Iron deficiency anemia, unspecified: Secondary | ICD-10-CM | POA: Diagnosis not present

## 2022-04-02 DIAGNOSIS — D631 Anemia in chronic kidney disease: Secondary | ICD-10-CM | POA: Diagnosis not present

## 2022-04-02 DIAGNOSIS — D696 Thrombocytopenia, unspecified: Secondary | ICD-10-CM | POA: Diagnosis not present

## 2022-04-05 DIAGNOSIS — D689 Coagulation defect, unspecified: Secondary | ICD-10-CM | POA: Diagnosis not present

## 2022-04-05 DIAGNOSIS — D696 Thrombocytopenia, unspecified: Secondary | ICD-10-CM | POA: Diagnosis not present

## 2022-04-05 DIAGNOSIS — D509 Iron deficiency anemia, unspecified: Secondary | ICD-10-CM | POA: Diagnosis not present

## 2022-04-05 DIAGNOSIS — Z23 Encounter for immunization: Secondary | ICD-10-CM | POA: Diagnosis not present

## 2022-04-05 DIAGNOSIS — N2581 Secondary hyperparathyroidism of renal origin: Secondary | ICD-10-CM | POA: Diagnosis not present

## 2022-04-05 DIAGNOSIS — Z992 Dependence on renal dialysis: Secondary | ICD-10-CM | POA: Diagnosis not present

## 2022-04-05 DIAGNOSIS — D631 Anemia in chronic kidney disease: Secondary | ICD-10-CM | POA: Diagnosis not present

## 2022-04-05 DIAGNOSIS — N186 End stage renal disease: Secondary | ICD-10-CM | POA: Diagnosis not present

## 2022-04-07 DIAGNOSIS — D509 Iron deficiency anemia, unspecified: Secondary | ICD-10-CM | POA: Diagnosis not present

## 2022-04-07 DIAGNOSIS — N186 End stage renal disease: Secondary | ICD-10-CM | POA: Diagnosis not present

## 2022-04-07 DIAGNOSIS — Z992 Dependence on renal dialysis: Secondary | ICD-10-CM | POA: Diagnosis not present

## 2022-04-07 DIAGNOSIS — N2581 Secondary hyperparathyroidism of renal origin: Secondary | ICD-10-CM | POA: Diagnosis not present

## 2022-04-07 DIAGNOSIS — Z23 Encounter for immunization: Secondary | ICD-10-CM | POA: Diagnosis not present

## 2022-04-07 DIAGNOSIS — D689 Coagulation defect, unspecified: Secondary | ICD-10-CM | POA: Diagnosis not present

## 2022-04-07 DIAGNOSIS — D696 Thrombocytopenia, unspecified: Secondary | ICD-10-CM | POA: Diagnosis not present

## 2022-04-07 DIAGNOSIS — D631 Anemia in chronic kidney disease: Secondary | ICD-10-CM | POA: Diagnosis not present

## 2022-04-08 ENCOUNTER — Ambulatory Visit (HOSPITAL_COMMUNITY): Payer: Medicare Other | Attending: Internal Medicine

## 2022-04-08 DIAGNOSIS — R0609 Other forms of dyspnea: Secondary | ICD-10-CM | POA: Insufficient documentation

## 2022-04-08 LAB — ECHOCARDIOGRAM COMPLETE
AV Mean grad: 11 mmHg
Area-P 1/2: 3.31 cm2
S' Lateral: 1.85 cm

## 2022-04-09 DIAGNOSIS — Z23 Encounter for immunization: Secondary | ICD-10-CM | POA: Diagnosis not present

## 2022-04-09 DIAGNOSIS — N2581 Secondary hyperparathyroidism of renal origin: Secondary | ICD-10-CM | POA: Diagnosis not present

## 2022-04-09 DIAGNOSIS — D689 Coagulation defect, unspecified: Secondary | ICD-10-CM | POA: Diagnosis not present

## 2022-04-09 DIAGNOSIS — Z992 Dependence on renal dialysis: Secondary | ICD-10-CM | POA: Diagnosis not present

## 2022-04-09 DIAGNOSIS — D509 Iron deficiency anemia, unspecified: Secondary | ICD-10-CM | POA: Diagnosis not present

## 2022-04-09 DIAGNOSIS — D696 Thrombocytopenia, unspecified: Secondary | ICD-10-CM | POA: Diagnosis not present

## 2022-04-09 DIAGNOSIS — N186 End stage renal disease: Secondary | ICD-10-CM | POA: Diagnosis not present

## 2022-04-09 DIAGNOSIS — D631 Anemia in chronic kidney disease: Secondary | ICD-10-CM | POA: Diagnosis not present

## 2022-04-12 DIAGNOSIS — Z992 Dependence on renal dialysis: Secondary | ICD-10-CM | POA: Diagnosis not present

## 2022-04-12 DIAGNOSIS — D696 Thrombocytopenia, unspecified: Secondary | ICD-10-CM | POA: Diagnosis not present

## 2022-04-12 DIAGNOSIS — D631 Anemia in chronic kidney disease: Secondary | ICD-10-CM | POA: Diagnosis not present

## 2022-04-12 DIAGNOSIS — D689 Coagulation defect, unspecified: Secondary | ICD-10-CM | POA: Diagnosis not present

## 2022-04-12 DIAGNOSIS — Z23 Encounter for immunization: Secondary | ICD-10-CM | POA: Diagnosis not present

## 2022-04-12 DIAGNOSIS — N186 End stage renal disease: Secondary | ICD-10-CM | POA: Diagnosis not present

## 2022-04-12 DIAGNOSIS — N2581 Secondary hyperparathyroidism of renal origin: Secondary | ICD-10-CM | POA: Diagnosis not present

## 2022-04-12 DIAGNOSIS — D509 Iron deficiency anemia, unspecified: Secondary | ICD-10-CM | POA: Diagnosis not present

## 2022-04-13 DIAGNOSIS — Z992 Dependence on renal dialysis: Secondary | ICD-10-CM | POA: Diagnosis not present

## 2022-04-13 DIAGNOSIS — N186 End stage renal disease: Secondary | ICD-10-CM | POA: Diagnosis not present

## 2022-04-14 DIAGNOSIS — Z992 Dependence on renal dialysis: Secondary | ICD-10-CM | POA: Diagnosis not present

## 2022-04-14 DIAGNOSIS — N186 End stage renal disease: Secondary | ICD-10-CM | POA: Diagnosis not present

## 2022-04-14 DIAGNOSIS — D689 Coagulation defect, unspecified: Secondary | ICD-10-CM | POA: Diagnosis not present

## 2022-04-14 DIAGNOSIS — D696 Thrombocytopenia, unspecified: Secondary | ICD-10-CM | POA: Diagnosis not present

## 2022-04-14 DIAGNOSIS — N2581 Secondary hyperparathyroidism of renal origin: Secondary | ICD-10-CM | POA: Diagnosis not present

## 2022-04-14 DIAGNOSIS — D509 Iron deficiency anemia, unspecified: Secondary | ICD-10-CM | POA: Diagnosis not present

## 2022-04-16 DIAGNOSIS — D689 Coagulation defect, unspecified: Secondary | ICD-10-CM | POA: Diagnosis not present

## 2022-04-16 DIAGNOSIS — N186 End stage renal disease: Secondary | ICD-10-CM | POA: Diagnosis not present

## 2022-04-16 DIAGNOSIS — D509 Iron deficiency anemia, unspecified: Secondary | ICD-10-CM | POA: Diagnosis not present

## 2022-04-16 DIAGNOSIS — D696 Thrombocytopenia, unspecified: Secondary | ICD-10-CM | POA: Diagnosis not present

## 2022-04-16 DIAGNOSIS — N2581 Secondary hyperparathyroidism of renal origin: Secondary | ICD-10-CM | POA: Diagnosis not present

## 2022-04-16 DIAGNOSIS — Z992 Dependence on renal dialysis: Secondary | ICD-10-CM | POA: Diagnosis not present

## 2022-04-19 DIAGNOSIS — N2581 Secondary hyperparathyroidism of renal origin: Secondary | ICD-10-CM | POA: Diagnosis not present

## 2022-04-19 DIAGNOSIS — D509 Iron deficiency anemia, unspecified: Secondary | ICD-10-CM | POA: Diagnosis not present

## 2022-04-19 DIAGNOSIS — D689 Coagulation defect, unspecified: Secondary | ICD-10-CM | POA: Diagnosis not present

## 2022-04-19 DIAGNOSIS — D696 Thrombocytopenia, unspecified: Secondary | ICD-10-CM | POA: Diagnosis not present

## 2022-04-19 DIAGNOSIS — Z992 Dependence on renal dialysis: Secondary | ICD-10-CM | POA: Diagnosis not present

## 2022-04-19 DIAGNOSIS — N186 End stage renal disease: Secondary | ICD-10-CM | POA: Diagnosis not present

## 2022-04-21 ENCOUNTER — Encounter: Payer: Self-pay | Admitting: Internal Medicine

## 2022-04-21 ENCOUNTER — Ambulatory Visit (HOSPITAL_COMMUNITY)
Admission: RE | Admit: 2022-04-21 | Discharge: 2022-04-21 | Disposition: A | Discharge status: Home / Self Care | Payer: Medicare Other | Source: Ambulatory Visit | Attending: Internal Medicine | Admitting: Internal Medicine

## 2022-04-21 ENCOUNTER — Ambulatory Visit: Payer: Medicare Other | Admitting: Internal Medicine

## 2022-04-21 VITALS — BP 136/82 | HR 84 | Temp 98.2°F | Ht 77.0 in | Wt 185.4 lb

## 2022-04-21 DIAGNOSIS — N2581 Secondary hyperparathyroidism of renal origin: Secondary | ICD-10-CM | POA: Diagnosis not present

## 2022-04-21 DIAGNOSIS — J90 Pleural effusion, not elsewhere classified: Secondary | ICD-10-CM

## 2022-04-21 DIAGNOSIS — I272 Pulmonary hypertension, unspecified: Secondary | ICD-10-CM | POA: Diagnosis not present

## 2022-04-21 DIAGNOSIS — N186 End stage renal disease: Secondary | ICD-10-CM | POA: Diagnosis not present

## 2022-04-21 DIAGNOSIS — J9811 Atelectasis: Secondary | ICD-10-CM | POA: Diagnosis not present

## 2022-04-21 DIAGNOSIS — D689 Coagulation defect, unspecified: Secondary | ICD-10-CM | POA: Diagnosis not present

## 2022-04-21 DIAGNOSIS — D696 Thrombocytopenia, unspecified: Secondary | ICD-10-CM | POA: Diagnosis not present

## 2022-04-21 DIAGNOSIS — D509 Iron deficiency anemia, unspecified: Secondary | ICD-10-CM | POA: Diagnosis not present

## 2022-04-21 DIAGNOSIS — Z992 Dependence on renal dialysis: Secondary | ICD-10-CM | POA: Diagnosis not present

## 2022-04-21 NOTE — Patient Instructions (Signed)
Incentive spirometry every hour x 4 breaths at a time   Please remember to go to the  x-ray department  @  [facility] for your tests - we will call you with the results when they are available     Please schedule a follow up office visit in 4 weeks, sooner if needed bring your spirometer   Add ono on RA for [facility] ordered

## 2022-04-21 NOTE — Progress Notes (Signed)
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  referred to pulmonary clinic in [place]  04/21/2022 by Dr  Marchelle Gearing for f/u R exudative effusion in setting of ihd/esrf s/p CABG around 2012 and did fine until onset of R sided pain / fever 11/2020 dx   liver abscess assoc with small R effusion >  rx  IR drains and abx solved liver abscess but R effusion persisted and not tapped until  03/26/22 x 900 cc exudate with nl glucose, no wbc count, neg cytology > breathing improved but not to baseline   History of Present Illness  04/21/2022  Pulmonary/ 1st office eval/ [name] / [facility]  Chief Complaint  Patient presents with   New Patient (Initial Visit)    Has seen Dr. Marchelle Gearing in [place] and following up on tests   Dyspnea: c/o  tired p 30 yards/ legs get weak but really no limiting sob Cough: none  Sleep: bed is flat, one or two pillows  SABA use: none  02: none  R cw discomfort x sev months not pleuritic but positional  No obvious day to day or daytime pattern/variability or assoc excess/ purulent sputum or mucus plugs or hemoptysis or chest tightness, subjective wheeze or overt sinus or hb symptoms.   Sleeping  without nocturnal  or early am exacerbation  of respiratory  c/o's or need for noct saba. Also denies any obvious fluctuation of symptoms with weather or environmental changes or other aggravating or alleviating factors except as outlined above   No unusual exposure hx or h/o childhood pna/ asthma or knowledge of premature birth.  Current Allergies, Complete Past Medical History, Past Surgical History, Family History, and Social History were reviewed in [employer] record.  ROS  The following are not active complaints unless bolded Hoarseness, sore throat, dysphagia, dental problems, itching, sneezing,  nasal congestion or discharge of excess mucus or purulent secretions, ear  ache,   fever, chills, sweats, unintended wt loss or wt gain, classically pleuritic or exertional cp,  orthopnea pnd or arm/hand swelling  or leg swelling, presyncope, palpitations, abdominal pain, anorexia, nausea, vomiting, diarrhea  or change in bowel habits or change in bladder habits, change in stools or change in urine, dysuria, hematuria,  rash, arthralgias, visual complaints, headache, numbness, weakness or ataxia or problems with walking or coordination,  change in mood or  memory.             Past Medical History:  Diagnosis Date   Arthritis    CAD (coronary artery disease)    STEMI with LAD stent by [facility] in 2005   Dependence on renal dialysis (HCC)    Depression    Diabetes mellitus without complication (HCC)    History of kidney stones    Hypertension    Kidney infection    MI (mitral incompetence)    Myocardial infarction (HCC)    Seasonal allergies     Outpatient Medications Prior to Visit  Medication Sig Dispense Refill   acetaminophen (TYLENOL) 500 MG tablet Take 325 mg by mouth every 6 (six) hours as needed for mild pain or moderate pain.     amitriptyline (ELAVIL) 25 MG tablet Take 25 mg by mouth at bedtime.     aspirin EC 325 MG tablet Take 325mg s once daily on non dialysis days. Dialysis days are Mon, Wed, Fri  atorvastatin (LIPITOR) 80 MG tablet Take 1 tablet (80 mg total) by mouth daily at 6 PM. 30 tablet 0   carvedilol (COREG) 25 MG tablet Take 1 tablet (25 mg total) by mouth 2 (two) times daily. 60 tablet 5   cloNIDine (CATAPRES) 0.2 MG tablet Take 1 tablet (0.2 mg total) by mouth 2 (two) times daily. 60 tablet 5   diphenhydramine-acetaminophen (TYLENOL PM) 25-500 MG TABS tablet Take 1 tablet by mouth at bedtime as needed (sleep).     hydrALAZINE (APRESOLINE) 50 MG tablet Take 1 tablet (50 mg total) by mouth 3 (three) times daily. 90 tablet 5   insulin glargine (LANTUS) 100 UNIT/ML injection Inject 10-15 Units into the skin at bedtime. Sliding scale over  200     latanoprost (XALATAN) 0.005 % ophthalmic solution Place 2 drops into both eyes 2 (two) times daily.     minoxidil (LONITEN) 10 MG tablet Take 0.5 tablets (5 mg total) by mouth at bedtime. Patient does not take as prescribed. Sometimes takes a 1/2 tablet depending on BP readings 30 tablet 5   Multiple Vitamin (DAILY VITAMIN PO) Take 1 tablet by mouth daily as needed (supplement).     Omega-3 Fatty Acids (FISH OIL) 1000 MG CAPS Take 1,000 mg by mouth daily.     OVER THE COUNTER MEDICATION Take 1 tablet by mouth daily. Beet Root     sevelamer carbonate (RENVELA) 800 MG tablet 1 tablet with meals     No facility-administered medications prior to visit.     Objective:     BP 136/82   Pulse 84   Temp 98.2 F (36.8 C)   Ht 6\' 5"  (1.956 m)   Wt 185 lb 6.4 oz (84.1 kg)   SpO2 92% Comment: ra  BMI 21.99 kg/m   SpO2: 92 % (ra)   HEENT : Oropharynx  clear      Nasal turbinates nl    NECK :  without  apparent JVD/ palpable Nodes/TM    LUNGS: no acc muscle use,  Nl contour chest decreased bs R base with dullness    CV:  RRR  no s3 or murmur or increase in P2, and no edema   ABD:  soft and nontender with nl inspiratory excursion in the supine position. No bruits or organomegaly appreciated   MS:  Nl gait/ ext warm without deformities Or obvious joint restrictions  calf tenderness, cyanosis or clubbing    SKIN: warm and dry without lesions    NEURO:  alert, approp, nl sensorium with  no motor or cerebellar deficits apparent.   CXR PA and Lateral:   04/21/2022 :    I personally reviewed images and agree with radiology impression as follows:   Partially loculated moderate RIGHT pleural effusion and basilar atelectasis, slightly increased from previous exam. Enlargement of cardiac silhouette.  CT p tap 03/26/22  1. Decreased small to moderate loculated RIGHT pleural effusion since 03/05/2022. Associated RIGHT mid and lower lung consolidation/atelectasis is relatively  unchanged. 2. Subsegmental atelectasis/scarring throughout the RIGHT lung and LEFT lower lobe again noted. 3. Cardiomegaly and CABG changes. 4.  Aortic Atherosclerosis (ICD10-I70.0).    Assessment   Pleural effusion on right Onset 11/2020 with assoc liver abscess - R thoracentesis 03/26/22 x 900 cc/ neg cyt exudate/ no wbc/ nl glucose - CT p tap 03/26/22  1. Decreased small to moderate loculated RIGHT pleural effusion since 03/05/2022. Associated RIGHT mid and lower lung consolidation/atelectasis is relatively unchanged. 2. Subsegmental atelectasis/scarring throughout the RIGHT  lung and LEFT lower lobe again noted. - 04/21/2022   Walked on RA  x  3  lap(s) =  approx 450  ft  @ mod pace, stopped due to end of study with lowest 02 sats 94% and tired > sob at end    Effusion and atx are chronic and may not be able to re-inflated the entire lung at this point given the chronicity  - he is not really that symptomatic at this point and the only way to help him further would be a VATS so will hold on further interventions for now and follow on daily IS and f/u in 4 weeks   Pulmonary hypertension (HCC) See echo 04/08/22    G2 diastolic dysfunction and mild LAE/RAE with est PAS 58   Likely elements of WHO 2 and 3 PH would need R heart cath to sort out in meantime need ONO on RA to be sure noct desaturation from chronic RLL atx is not contributing.  Discussed in detail all the  indications, usual  risks and alternatives  relative to the benefits with patient who agrees to proceed with w/u as outlined.     Each maintenance medication was reviewed in detail including emphasizing most importantly the difference between maintenance and prns and under what circumstances the prns are to be triggered using an action plan format where appropriate.  Total time for H and P, chart review, counseling,  directly observing portions of ambulatory 02 saturation study/ and generating customized AVS unique to this  office visit / same day charting = 54 min for consultation mod/ high complexity                    Sandrea Hughs, MD 04/21/2022

## 2022-04-22 ENCOUNTER — Encounter: Payer: Self-pay | Admitting: Internal Medicine

## 2022-04-22 DIAGNOSIS — I272 Pulmonary hypertension, unspecified: Secondary | ICD-10-CM | POA: Insufficient documentation

## 2022-04-22 NOTE — Assessment & Plan Note (Signed)
See echo 89/21/19    G2 diastolic dysfunction and mild LAE/RAE with est PAS 58   Likely elements of WHO 2 and 3 PH would need R heart cath to sort out in meantime need ONO on RA to be sure noct desaturation from chronic RLL atx is not contributing.  Discussed in detail all the  indications, usual  risks and alternatives  relative to the benefits with patient who agrees to proceed with w/u as outlined.     Each maintenance medication was reviewed in detail including emphasizing most importantly the difference between maintenance and prns and under what circumstances the prns are to be triggered using an action plan format where appropriate.  Total time for H and P, chart review, counseling,  directly observing portions of ambulatory 02 saturation study/ and generating customized AVS unique to this office visit / same day charting = 54 min for consultation mod/ high complexity

## 2022-04-22 NOTE — Assessment & Plan Note (Signed)
Onset 11/2020 with assoc liver abscess - R thoracentesis 03/26/22 x 900 cc/ neg cyt exudate/ no wbc/ nl glucose - CT p tap 03/26/22  1. Decreased small to moderate loculated RIGHT pleural effusion since 03/05/2022. Associated RIGHT mid and lower lung consolidation/atelectasis is relatively unchanged. 2. Subsegmental atelectasis/scarring throughout the RIGHT lung and LEFT lower lobe again noted. - 04/21/2022   Walked on RA  x  3  lap(s) =  approx 450  ft  @ mod pace, stopped due to end of study with lowest 02 sats 94% and tired > sob at end    Effusion and atx are chronic and may not be able to re-inflated the entire lung at this point given the chronicity  - he is not really that symptomatic at this point and the only way to help him further would be a VATS so will hold on further interventions for now and follow on daily IS and f/u in 4 weeks

## 2022-04-23 DIAGNOSIS — D696 Thrombocytopenia, unspecified: Secondary | ICD-10-CM | POA: Diagnosis not present

## 2022-04-23 DIAGNOSIS — D509 Iron deficiency anemia, unspecified: Secondary | ICD-10-CM | POA: Diagnosis not present

## 2022-04-23 DIAGNOSIS — N2581 Secondary hyperparathyroidism of renal origin: Secondary | ICD-10-CM | POA: Diagnosis not present

## 2022-04-23 DIAGNOSIS — N186 End stage renal disease: Secondary | ICD-10-CM | POA: Diagnosis not present

## 2022-04-23 DIAGNOSIS — D689 Coagulation defect, unspecified: Secondary | ICD-10-CM | POA: Diagnosis not present

## 2022-04-23 DIAGNOSIS — Z992 Dependence on renal dialysis: Secondary | ICD-10-CM | POA: Diagnosis not present

## 2022-04-26 DIAGNOSIS — D509 Iron deficiency anemia, unspecified: Secondary | ICD-10-CM | POA: Diagnosis not present

## 2022-04-26 DIAGNOSIS — D696 Thrombocytopenia, unspecified: Secondary | ICD-10-CM | POA: Diagnosis not present

## 2022-04-26 DIAGNOSIS — N186 End stage renal disease: Secondary | ICD-10-CM | POA: Diagnosis not present

## 2022-04-26 DIAGNOSIS — Z992 Dependence on renal dialysis: Secondary | ICD-10-CM | POA: Diagnosis not present

## 2022-04-26 DIAGNOSIS — D689 Coagulation defect, unspecified: Secondary | ICD-10-CM | POA: Diagnosis not present

## 2022-04-26 DIAGNOSIS — N2581 Secondary hyperparathyroidism of renal origin: Secondary | ICD-10-CM | POA: Diagnosis not present

## 2022-04-28 DIAGNOSIS — D509 Iron deficiency anemia, unspecified: Secondary | ICD-10-CM | POA: Diagnosis not present

## 2022-04-28 DIAGNOSIS — N186 End stage renal disease: Secondary | ICD-10-CM | POA: Diagnosis not present

## 2022-04-28 DIAGNOSIS — Z992 Dependence on renal dialysis: Secondary | ICD-10-CM | POA: Diagnosis not present

## 2022-04-28 DIAGNOSIS — D689 Coagulation defect, unspecified: Secondary | ICD-10-CM | POA: Diagnosis not present

## 2022-04-28 DIAGNOSIS — N2581 Secondary hyperparathyroidism of renal origin: Secondary | ICD-10-CM | POA: Diagnosis not present

## 2022-04-28 DIAGNOSIS — D696 Thrombocytopenia, unspecified: Secondary | ICD-10-CM | POA: Diagnosis not present

## 2022-04-30 DIAGNOSIS — Z992 Dependence on renal dialysis: Secondary | ICD-10-CM | POA: Diagnosis not present

## 2022-04-30 DIAGNOSIS — D509 Iron deficiency anemia, unspecified: Secondary | ICD-10-CM | POA: Diagnosis not present

## 2022-04-30 DIAGNOSIS — N2581 Secondary hyperparathyroidism of renal origin: Secondary | ICD-10-CM | POA: Diagnosis not present

## 2022-04-30 DIAGNOSIS — D689 Coagulation defect, unspecified: Secondary | ICD-10-CM | POA: Diagnosis not present

## 2022-04-30 DIAGNOSIS — N186 End stage renal disease: Secondary | ICD-10-CM | POA: Diagnosis not present

## 2022-04-30 DIAGNOSIS — D696 Thrombocytopenia, unspecified: Secondary | ICD-10-CM | POA: Diagnosis not present

## 2022-05-02 DIAGNOSIS — D509 Iron deficiency anemia, unspecified: Secondary | ICD-10-CM | POA: Diagnosis not present

## 2022-05-02 DIAGNOSIS — N186 End stage renal disease: Secondary | ICD-10-CM | POA: Diagnosis not present

## 2022-05-02 DIAGNOSIS — D689 Coagulation defect, unspecified: Secondary | ICD-10-CM | POA: Diagnosis not present

## 2022-05-02 DIAGNOSIS — Z992 Dependence on renal dialysis: Secondary | ICD-10-CM | POA: Diagnosis not present

## 2022-05-02 DIAGNOSIS — D696 Thrombocytopenia, unspecified: Secondary | ICD-10-CM | POA: Diagnosis not present

## 2022-05-02 DIAGNOSIS — N2581 Secondary hyperparathyroidism of renal origin: Secondary | ICD-10-CM | POA: Diagnosis not present

## 2022-05-04 DIAGNOSIS — N186 End stage renal disease: Secondary | ICD-10-CM | POA: Diagnosis not present

## 2022-05-04 DIAGNOSIS — Z992 Dependence on renal dialysis: Secondary | ICD-10-CM | POA: Diagnosis not present

## 2022-05-04 DIAGNOSIS — D509 Iron deficiency anemia, unspecified: Secondary | ICD-10-CM | POA: Diagnosis not present

## 2022-05-04 DIAGNOSIS — N2581 Secondary hyperparathyroidism of renal origin: Secondary | ICD-10-CM | POA: Diagnosis not present

## 2022-05-04 DIAGNOSIS — D696 Thrombocytopenia, unspecified: Secondary | ICD-10-CM | POA: Diagnosis not present

## 2022-05-04 DIAGNOSIS — D689 Coagulation defect, unspecified: Secondary | ICD-10-CM | POA: Diagnosis not present

## 2022-05-07 DIAGNOSIS — N186 End stage renal disease: Secondary | ICD-10-CM | POA: Diagnosis not present

## 2022-05-07 DIAGNOSIS — N2581 Secondary hyperparathyroidism of renal origin: Secondary | ICD-10-CM | POA: Diagnosis not present

## 2022-05-07 DIAGNOSIS — D509 Iron deficiency anemia, unspecified: Secondary | ICD-10-CM | POA: Diagnosis not present

## 2022-05-07 DIAGNOSIS — D696 Thrombocytopenia, unspecified: Secondary | ICD-10-CM | POA: Diagnosis not present

## 2022-05-07 DIAGNOSIS — D689 Coagulation defect, unspecified: Secondary | ICD-10-CM | POA: Diagnosis not present

## 2022-05-07 DIAGNOSIS — Z992 Dependence on renal dialysis: Secondary | ICD-10-CM | POA: Diagnosis not present

## 2022-05-10 DIAGNOSIS — Z992 Dependence on renal dialysis: Secondary | ICD-10-CM | POA: Diagnosis not present

## 2022-05-10 DIAGNOSIS — N2581 Secondary hyperparathyroidism of renal origin: Secondary | ICD-10-CM | POA: Diagnosis not present

## 2022-05-10 DIAGNOSIS — D689 Coagulation defect, unspecified: Secondary | ICD-10-CM | POA: Diagnosis not present

## 2022-05-10 DIAGNOSIS — D509 Iron deficiency anemia, unspecified: Secondary | ICD-10-CM | POA: Diagnosis not present

## 2022-05-10 DIAGNOSIS — N186 End stage renal disease: Secondary | ICD-10-CM | POA: Diagnosis not present

## 2022-05-10 DIAGNOSIS — D696 Thrombocytopenia, unspecified: Secondary | ICD-10-CM | POA: Diagnosis not present

## 2022-05-12 DIAGNOSIS — D509 Iron deficiency anemia, unspecified: Secondary | ICD-10-CM | POA: Diagnosis not present

## 2022-05-12 DIAGNOSIS — N2581 Secondary hyperparathyroidism of renal origin: Secondary | ICD-10-CM | POA: Diagnosis not present

## 2022-05-12 DIAGNOSIS — Z992 Dependence on renal dialysis: Secondary | ICD-10-CM | POA: Diagnosis not present

## 2022-05-12 DIAGNOSIS — D696 Thrombocytopenia, unspecified: Secondary | ICD-10-CM | POA: Diagnosis not present

## 2022-05-12 DIAGNOSIS — D689 Coagulation defect, unspecified: Secondary | ICD-10-CM | POA: Diagnosis not present

## 2022-05-12 DIAGNOSIS — N186 End stage renal disease: Secondary | ICD-10-CM | POA: Diagnosis not present

## 2022-05-13 DIAGNOSIS — N186 End stage renal disease: Secondary | ICD-10-CM | POA: Diagnosis not present

## 2022-05-13 DIAGNOSIS — Z992 Dependence on renal dialysis: Secondary | ICD-10-CM | POA: Diagnosis not present

## 2022-05-14 DIAGNOSIS — D696 Thrombocytopenia, unspecified: Secondary | ICD-10-CM | POA: Diagnosis not present

## 2022-05-14 DIAGNOSIS — Z992 Dependence on renal dialysis: Secondary | ICD-10-CM | POA: Diagnosis not present

## 2022-05-14 DIAGNOSIS — N186 End stage renal disease: Secondary | ICD-10-CM | POA: Diagnosis not present

## 2022-05-14 DIAGNOSIS — N2581 Secondary hyperparathyroidism of renal origin: Secondary | ICD-10-CM | POA: Diagnosis not present

## 2022-05-14 DIAGNOSIS — D631 Anemia in chronic kidney disease: Secondary | ICD-10-CM | POA: Diagnosis not present

## 2022-05-14 DIAGNOSIS — D509 Iron deficiency anemia, unspecified: Secondary | ICD-10-CM | POA: Diagnosis not present

## 2022-05-14 DIAGNOSIS — D689 Coagulation defect, unspecified: Secondary | ICD-10-CM | POA: Diagnosis not present

## 2022-05-17 DIAGNOSIS — D631 Anemia in chronic kidney disease: Secondary | ICD-10-CM | POA: Diagnosis not present

## 2022-05-17 DIAGNOSIS — N2581 Secondary hyperparathyroidism of renal origin: Secondary | ICD-10-CM | POA: Diagnosis not present

## 2022-05-17 DIAGNOSIS — D509 Iron deficiency anemia, unspecified: Secondary | ICD-10-CM | POA: Diagnosis not present

## 2022-05-17 DIAGNOSIS — D696 Thrombocytopenia, unspecified: Secondary | ICD-10-CM | POA: Diagnosis not present

## 2022-05-17 DIAGNOSIS — N186 End stage renal disease: Secondary | ICD-10-CM | POA: Diagnosis not present

## 2022-05-17 DIAGNOSIS — Z992 Dependence on renal dialysis: Secondary | ICD-10-CM | POA: Diagnosis not present

## 2022-05-17 DIAGNOSIS — D689 Coagulation defect, unspecified: Secondary | ICD-10-CM | POA: Diagnosis not present

## 2022-05-18 ENCOUNTER — Ambulatory Visit (INDEPENDENT_AMBULATORY_CARE_PROVIDER_SITE_OTHER): Payer: Medicare Other | Admitting: Internal Medicine

## 2022-05-18 ENCOUNTER — Encounter: Payer: Self-pay | Admitting: Internal Medicine

## 2022-05-18 ENCOUNTER — Ambulatory Visit: Payer: Medicare Other | Admitting: Internal Medicine

## 2022-05-18 ENCOUNTER — Ambulatory Visit (HOSPITAL_COMMUNITY)
Admission: RE | Admit: 2022-05-18 | Discharge: 2022-05-18 | Disposition: A | Discharge status: Home / Self Care | Payer: Medicare Other | Source: Ambulatory Visit | Attending: Internal Medicine | Admitting: Internal Medicine

## 2022-05-18 VITALS — BP 142/82 | HR 78 | Temp 97.6°F | Ht 77.0 in | Wt 186.4 lb

## 2022-05-18 DIAGNOSIS — J90 Pleural effusion, not elsewhere classified: Secondary | ICD-10-CM

## 2022-05-18 DIAGNOSIS — I272 Pulmonary hypertension, unspecified: Secondary | ICD-10-CM

## 2022-05-18 NOTE — Assessment & Plan Note (Addendum)
See echo 99/41/29    G2 diastolic dysfunction and mild LAE/RAE with est PAS 58  - ONO on RA 04/22/2022 >>> not done 05/18/2022 >> repeat request submitted to make sure no noct desaturation occurring that  would add to risk of WHO 3 PH  He has cardiologist at [facility] as well  > advised keep f/u appts re WHO 2 vs WHO 3 [place]

## 2022-05-18 NOTE — Progress Notes (Signed)
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  referred to pulmonary clinic in [place]  04/21/2022 by Dr  Chase Caller for f/u R exudative effusion in setting of ihd/esrf s/p CABG around 2012 and did fine until onset of R sided pain / fever 11/2020 dx   liver abscess assoc with small R effusion >  rx  IR drains and abx solved liver abscess but R effusion persisted and not tapped until  03/26/22 x 900 cc exudate with nl glucose, no wbc count, neg cytology > breathing improved but not to baseline   History of Present Illness  04/21/2022  Pulmonary/ 1st office eval/ [place] / [place] [facility]  Chief Complaint  Patient presents with   New Patient (Initial Visit)    Has seen Dr. Chase Caller in [place] and following up on tests   Dyspnea: c/o  tired p 30 yards/ legs get weak but really no limiting sob Cough: none  Sleep: bed is flat, one or two pillows  SABA use: none  02: none  R cw discomfort x sev months not pleuritic but positional Rec Incentive spirometry every hour x 4 breaths at a time  Please schedule a follow up office visit in 4 weeks, sooner if needed bring your spirometer  Add ono on RA for [place] ordered    05/18/2022  f/u ov/ office/Jazilyn Siegenthaler re:HD pt  R effusion/ [place]    Chief Complaint  Patient presents with   Follow-up    Doing well since last ov   Dyspnea:  tol more activity /shopping  Cough: no/ cp discomfort better  Sleeping: flat bed 1-2 pillows s resp cc  SABA use: none  02: none  Covid status: vax x 4  Getting IS above 1600 for the 1st time   No obvious day to day or daytime variability or assoc excess/ purulent sputum or mucus plugs or hemoptysis or  subjective wheeze or overt sinus or hb symptoms.   Sleeping as above without nocturnal  or early am exacerbation  of respiratory  c/o's or need for noct saba. Also denies any obvious fluctuation of symptoms with weather or environmental changes  or other aggravating or alleviating factors except as outlined above   No unusual exposure hx or h/o childhood pna/ asthma or knowledge of premature birth.  Current Allergies, Complete Past Medical History, Past Surgical History, Family History, and Social History were reviewed in [employer] record.  ROS  The following are not active complaints unless bolded Hoarseness, sore throat, dysphagia, dental problems, itching, sneezing,  nasal congestion or discharge of excess mucus or purulent secretions, ear ache,   fever, chills, sweats, unintended wt loss or wt gain, classically pleuritic or exertional cp,  orthopnea pnd or arm/hand swelling  or leg swelling, presyncope, palpitations, abdominal pain, anorexia, nausea, vomiting, diarrhea  or change in bowel habits or change in bladder habits, change in stools or change in urine, dysuria, hematuria,  rash, arthralgias, visual complaints, headache, numbness, weakness or ataxia or problems with walking or coordination,  change in mood or  memory.        Current Meds  Medication Sig   acetaminophen (TYLENOL) 500 MG tablet Take 325 mg by mouth every 6 (six) hours as needed for mild pain or moderate pain.   amitriptyline (ELAVIL) 25 MG tablet Take 25 mg by mouth at bedtime.   aspirin EC 325  MG tablet Take '325mg'$ s once daily on non dialysis days. Dialysis days are Mon, Wed, Fri   atorvastatin (LIPITOR) 80 MG tablet Take 1 tablet (80 mg total) by mouth daily at 6 PM.   carvedilol (COREG) 25 MG tablet Take 1 tablet (25 mg total) by mouth 2 (two) times daily.   cloNIDine (CATAPRES) 0.2 MG tablet Take 1 tablet (0.2 mg total) by mouth 2 (two) times daily.   diphenhydramine-acetaminophen (TYLENOL PM) 25-500 MG TABS tablet Take 1 tablet by mouth at bedtime as needed (sleep).   hydrALAZINE (APRESOLINE) 50 MG tablet Take 1 tablet (50 mg total) by mouth 3 (three) times daily.   insulin glargine (LANTUS) 100 UNIT/ML injection Inject 10-15 Units  into the skin at bedtime. Sliding scale over 200   latanoprost (XALATAN) 0.005 % ophthalmic solution Place 2 drops into both eyes 2 (two) times daily.   minoxidil (LONITEN) 10 MG tablet Take 0.5 tablets (5 mg total) by mouth at bedtime. Patient does not take as prescribed. Sometimes takes a 1/2 tablet depending on BP readings   Multiple Vitamin (DAILY VITAMIN PO) Take 1 tablet by mouth daily as needed (supplement).   Omega-3 Fatty Acids (FISH OIL) 1000 MG CAPS Take 1,000 mg by mouth daily.   OVER THE COUNTER MEDICATION Take 1 tablet by mouth daily. Beet Root   sevelamer carbonate (RENVELA) 800 MG tablet 1 tablet with meals                      Past Medical History:  Diagnosis Date   Arthritis    CAD (coronary artery disease)    STEMI with LAD stent by C Granger in 2005   Dependence on renal dialysis ([place])    Depression    Diabetes mellitus without complication ([place])    History of kidney stones    Hypertension    Kidney infection    MI (mitral incompetence)    Myocardial infarction ([place])    Seasonal allergies        Objective:     Wt Readings from Last 3 Encounters:  05/18/22 186 lb 6.4 oz (84.6 kg)  04/21/22 185 lb 6.4 oz (84.1 kg)  03/16/22 192 lb (87.1 kg)      Vital signs reviewed  05/18/2022  - Note at rest 02 sats  95% on RA   General appearance:    amb bm nad   HEENT : Oropharynx  clear     Nasal turbinates nl    NECK :  without  apparent JVD/ palpable Nodes/TM    LUNGS: no acc muscle use,  Nl contour chest with decreased bs/ dullness R a base    CV:  RRR  no s3  with 2-3/VI SEM and increase in P2, and no edema   ABD:  soft and nontender with nl inspiratory excursion in the supine position. No bruits or organomegaly appreciated   MS:  Nl gait/ ext warm without deformities Or obvious joint restrictions  calf tenderness, cyanosis or clubbing    SKIN: warm and dry without lesions    NEURO:  alert, approp, nl sensorium with  no motor or cerebellar  deficits apparent.          CXR PA and Lateral:   05/18/2022 :    I personally reviewed images and impression is as follows:     Minimal change toward improvement     Assessment

## 2022-05-18 NOTE — Assessment & Plan Note (Addendum)
Onset 11/2020 with assoc liver abscess - R thoracentesis 03/26/22 x 900 cc/ neg cyt exudate/ no wbc/ nl glucose - CT p tap 03/26/22  1. Decreased small to moderate loculated RIGHT pleural effusion since 03/05/2022. Associated RIGHT mid and lower lung consolidation/atelectasis is relatively unchanged. 2. Subsegmental atelectasis/scarring throughout the RIGHT lung and LEFT lower lobe again noted. - 04/21/2022   Walked on RA  x  3  lap(s) =  approx 450  ft  @ mod pace, stopped due to end of study with lowest 02 sats 94% and tired > sob at end   Very slow improvement on consertive rx with IS but nothing further planned in terms of inteventions   Discussed in detail all the  indications, usual  risks and alternatives  relative to the benefits with patient who agrees to proceed with conservative f/u with f/u cxr in 6 weeks, call sooner if needed          Each maintenance medication was reviewed in detail including emphasizing most importantly the difference between maintenance and prns and under what circumstances the prns are to be triggered using an action plan format where appropriate.  Total time for H and P, chart review, counseling,   and generating customized AVS unique to this office visit / same day charting = 30 min

## 2022-05-18 NOTE — Patient Instructions (Addendum)
My office will be contacting you by phone for referral for overnight oxygen monitoring   - if you don't hear back from my office within one week please call us back or notify us thru MyChart and we'll address it right away.   Keep using your spirometer at least 4 x daily , and 4 x in a row when you use it   Please remember to go to the  x-ray department  @  [facility] for your tests - we will call you with the results when they are available     Please schedule a follow up office visit in 6 weeks, call sooner if needed

## 2022-05-18 NOTE — Addendum Note (Signed)
Addended by: Fritzi Mandes D on: 05/18/2022 02:27 PM   Modules accepted: Orders

## 2022-05-19 DIAGNOSIS — D631 Anemia in chronic kidney disease: Secondary | ICD-10-CM | POA: Diagnosis not present

## 2022-05-19 DIAGNOSIS — Z992 Dependence on renal dialysis: Secondary | ICD-10-CM | POA: Diagnosis not present

## 2022-05-19 DIAGNOSIS — N186 End stage renal disease: Secondary | ICD-10-CM | POA: Diagnosis not present

## 2022-05-19 DIAGNOSIS — D509 Iron deficiency anemia, unspecified: Secondary | ICD-10-CM | POA: Diagnosis not present

## 2022-05-19 DIAGNOSIS — D696 Thrombocytopenia, unspecified: Secondary | ICD-10-CM | POA: Diagnosis not present

## 2022-05-19 DIAGNOSIS — D689 Coagulation defect, unspecified: Secondary | ICD-10-CM | POA: Diagnosis not present

## 2022-05-19 DIAGNOSIS — N2581 Secondary hyperparathyroidism of renal origin: Secondary | ICD-10-CM | POA: Diagnosis not present

## 2022-05-21 DIAGNOSIS — D689 Coagulation defect, unspecified: Secondary | ICD-10-CM | POA: Diagnosis not present

## 2022-05-21 DIAGNOSIS — Z992 Dependence on renal dialysis: Secondary | ICD-10-CM | POA: Diagnosis not present

## 2022-05-21 DIAGNOSIS — D696 Thrombocytopenia, unspecified: Secondary | ICD-10-CM | POA: Diagnosis not present

## 2022-05-21 DIAGNOSIS — D509 Iron deficiency anemia, unspecified: Secondary | ICD-10-CM | POA: Diagnosis not present

## 2022-05-21 DIAGNOSIS — D631 Anemia in chronic kidney disease: Secondary | ICD-10-CM | POA: Diagnosis not present

## 2022-05-21 DIAGNOSIS — N186 End stage renal disease: Secondary | ICD-10-CM | POA: Diagnosis not present

## 2022-05-21 DIAGNOSIS — N2581 Secondary hyperparathyroidism of renal origin: Secondary | ICD-10-CM | POA: Diagnosis not present

## 2022-05-24 DIAGNOSIS — D696 Thrombocytopenia, unspecified: Secondary | ICD-10-CM | POA: Diagnosis not present

## 2022-05-24 DIAGNOSIS — D631 Anemia in chronic kidney disease: Secondary | ICD-10-CM | POA: Diagnosis not present

## 2022-05-24 DIAGNOSIS — Z992 Dependence on renal dialysis: Secondary | ICD-10-CM | POA: Diagnosis not present

## 2022-05-24 DIAGNOSIS — D509 Iron deficiency anemia, unspecified: Secondary | ICD-10-CM | POA: Diagnosis not present

## 2022-05-24 DIAGNOSIS — N186 End stage renal disease: Secondary | ICD-10-CM | POA: Diagnosis not present

## 2022-05-24 DIAGNOSIS — D689 Coagulation defect, unspecified: Secondary | ICD-10-CM | POA: Diagnosis not present

## 2022-05-24 DIAGNOSIS — N2581 Secondary hyperparathyroidism of renal origin: Secondary | ICD-10-CM | POA: Diagnosis not present

## 2022-05-26 DIAGNOSIS — N2581 Secondary hyperparathyroidism of renal origin: Secondary | ICD-10-CM | POA: Diagnosis not present

## 2022-05-26 DIAGNOSIS — D631 Anemia in chronic kidney disease: Secondary | ICD-10-CM | POA: Diagnosis not present

## 2022-05-26 DIAGNOSIS — D689 Coagulation defect, unspecified: Secondary | ICD-10-CM | POA: Diagnosis not present

## 2022-05-26 DIAGNOSIS — Z992 Dependence on renal dialysis: Secondary | ICD-10-CM | POA: Diagnosis not present

## 2022-05-26 DIAGNOSIS — D509 Iron deficiency anemia, unspecified: Secondary | ICD-10-CM | POA: Diagnosis not present

## 2022-05-26 DIAGNOSIS — N186 End stage renal disease: Secondary | ICD-10-CM | POA: Diagnosis not present

## 2022-05-26 DIAGNOSIS — D696 Thrombocytopenia, unspecified: Secondary | ICD-10-CM | POA: Diagnosis not present

## 2022-05-28 DIAGNOSIS — N186 End stage renal disease: Secondary | ICD-10-CM | POA: Diagnosis not present

## 2022-05-28 DIAGNOSIS — N2581 Secondary hyperparathyroidism of renal origin: Secondary | ICD-10-CM | POA: Diagnosis not present

## 2022-05-28 DIAGNOSIS — D689 Coagulation defect, unspecified: Secondary | ICD-10-CM | POA: Diagnosis not present

## 2022-05-28 DIAGNOSIS — D509 Iron deficiency anemia, unspecified: Secondary | ICD-10-CM | POA: Diagnosis not present

## 2022-05-28 DIAGNOSIS — Z992 Dependence on renal dialysis: Secondary | ICD-10-CM | POA: Diagnosis not present

## 2022-05-28 DIAGNOSIS — D696 Thrombocytopenia, unspecified: Secondary | ICD-10-CM | POA: Diagnosis not present

## 2022-05-28 DIAGNOSIS — D631 Anemia in chronic kidney disease: Secondary | ICD-10-CM | POA: Diagnosis not present

## 2022-05-31 DIAGNOSIS — N186 End stage renal disease: Secondary | ICD-10-CM | POA: Diagnosis not present

## 2022-05-31 DIAGNOSIS — N2581 Secondary hyperparathyroidism of renal origin: Secondary | ICD-10-CM | POA: Diagnosis not present

## 2022-05-31 DIAGNOSIS — D631 Anemia in chronic kidney disease: Secondary | ICD-10-CM | POA: Diagnosis not present

## 2022-05-31 DIAGNOSIS — D689 Coagulation defect, unspecified: Secondary | ICD-10-CM | POA: Diagnosis not present

## 2022-05-31 DIAGNOSIS — Z992 Dependence on renal dialysis: Secondary | ICD-10-CM | POA: Diagnosis not present

## 2022-05-31 DIAGNOSIS — D696 Thrombocytopenia, unspecified: Secondary | ICD-10-CM | POA: Diagnosis not present

## 2022-05-31 DIAGNOSIS — D509 Iron deficiency anemia, unspecified: Secondary | ICD-10-CM | POA: Diagnosis not present

## 2022-06-02 DIAGNOSIS — N2581 Secondary hyperparathyroidism of renal origin: Secondary | ICD-10-CM | POA: Diagnosis not present

## 2022-06-02 DIAGNOSIS — D509 Iron deficiency anemia, unspecified: Secondary | ICD-10-CM | POA: Diagnosis not present

## 2022-06-02 DIAGNOSIS — D631 Anemia in chronic kidney disease: Secondary | ICD-10-CM | POA: Diagnosis not present

## 2022-06-02 DIAGNOSIS — N186 End stage renal disease: Secondary | ICD-10-CM | POA: Diagnosis not present

## 2022-06-02 DIAGNOSIS — D689 Coagulation defect, unspecified: Secondary | ICD-10-CM | POA: Diagnosis not present

## 2022-06-02 DIAGNOSIS — D696 Thrombocytopenia, unspecified: Secondary | ICD-10-CM | POA: Diagnosis not present

## 2022-06-02 DIAGNOSIS — Z992 Dependence on renal dialysis: Secondary | ICD-10-CM | POA: Diagnosis not present

## 2022-06-04 DIAGNOSIS — D631 Anemia in chronic kidney disease: Secondary | ICD-10-CM | POA: Diagnosis not present

## 2022-06-04 DIAGNOSIS — D689 Coagulation defect, unspecified: Secondary | ICD-10-CM | POA: Diagnosis not present

## 2022-06-04 DIAGNOSIS — N186 End stage renal disease: Secondary | ICD-10-CM | POA: Diagnosis not present

## 2022-06-04 DIAGNOSIS — Z992 Dependence on renal dialysis: Secondary | ICD-10-CM | POA: Diagnosis not present

## 2022-06-04 DIAGNOSIS — D696 Thrombocytopenia, unspecified: Secondary | ICD-10-CM | POA: Diagnosis not present

## 2022-06-04 DIAGNOSIS — N2581 Secondary hyperparathyroidism of renal origin: Secondary | ICD-10-CM | POA: Diagnosis not present

## 2022-06-04 DIAGNOSIS — D509 Iron deficiency anemia, unspecified: Secondary | ICD-10-CM | POA: Diagnosis not present

## 2022-06-06 DIAGNOSIS — D509 Iron deficiency anemia, unspecified: Secondary | ICD-10-CM | POA: Diagnosis not present

## 2022-06-06 DIAGNOSIS — D631 Anemia in chronic kidney disease: Secondary | ICD-10-CM | POA: Diagnosis not present

## 2022-06-06 DIAGNOSIS — D689 Coagulation defect, unspecified: Secondary | ICD-10-CM | POA: Diagnosis not present

## 2022-06-06 DIAGNOSIS — N186 End stage renal disease: Secondary | ICD-10-CM | POA: Diagnosis not present

## 2022-06-06 DIAGNOSIS — Z992 Dependence on renal dialysis: Secondary | ICD-10-CM | POA: Diagnosis not present

## 2022-06-06 DIAGNOSIS — N2581 Secondary hyperparathyroidism of renal origin: Secondary | ICD-10-CM | POA: Diagnosis not present

## 2022-06-06 DIAGNOSIS — D696 Thrombocytopenia, unspecified: Secondary | ICD-10-CM | POA: Diagnosis not present

## 2022-06-09 DIAGNOSIS — N186 End stage renal disease: Secondary | ICD-10-CM | POA: Diagnosis not present

## 2022-06-09 DIAGNOSIS — D689 Coagulation defect, unspecified: Secondary | ICD-10-CM | POA: Diagnosis not present

## 2022-06-09 DIAGNOSIS — D631 Anemia in chronic kidney disease: Secondary | ICD-10-CM | POA: Diagnosis not present

## 2022-06-09 DIAGNOSIS — D696 Thrombocytopenia, unspecified: Secondary | ICD-10-CM | POA: Diagnosis not present

## 2022-06-09 DIAGNOSIS — Z992 Dependence on renal dialysis: Secondary | ICD-10-CM | POA: Diagnosis not present

## 2022-06-09 DIAGNOSIS — N2581 Secondary hyperparathyroidism of renal origin: Secondary | ICD-10-CM | POA: Diagnosis not present

## 2022-06-09 DIAGNOSIS — D509 Iron deficiency anemia, unspecified: Secondary | ICD-10-CM | POA: Diagnosis not present

## 2022-06-10 DIAGNOSIS — B351 Tinea unguium: Secondary | ICD-10-CM | POA: Diagnosis not present

## 2022-06-11 DIAGNOSIS — N186 End stage renal disease: Secondary | ICD-10-CM | POA: Diagnosis not present

## 2022-06-11 DIAGNOSIS — D696 Thrombocytopenia, unspecified: Secondary | ICD-10-CM | POA: Diagnosis not present

## 2022-06-11 DIAGNOSIS — Z992 Dependence on renal dialysis: Secondary | ICD-10-CM | POA: Diagnosis not present

## 2022-06-11 DIAGNOSIS — D631 Anemia in chronic kidney disease: Secondary | ICD-10-CM | POA: Diagnosis not present

## 2022-06-11 DIAGNOSIS — N2581 Secondary hyperparathyroidism of renal origin: Secondary | ICD-10-CM | POA: Diagnosis not present

## 2022-06-11 DIAGNOSIS — D689 Coagulation defect, unspecified: Secondary | ICD-10-CM | POA: Diagnosis not present

## 2022-06-11 DIAGNOSIS — D509 Iron deficiency anemia, unspecified: Secondary | ICD-10-CM | POA: Diagnosis not present

## 2022-06-13 DIAGNOSIS — D696 Thrombocytopenia, unspecified: Secondary | ICD-10-CM | POA: Diagnosis not present

## 2022-06-13 DIAGNOSIS — D689 Coagulation defect, unspecified: Secondary | ICD-10-CM | POA: Diagnosis not present

## 2022-06-13 DIAGNOSIS — N2581 Secondary hyperparathyroidism of renal origin: Secondary | ICD-10-CM | POA: Diagnosis not present

## 2022-06-13 DIAGNOSIS — N186 End stage renal disease: Secondary | ICD-10-CM | POA: Diagnosis not present

## 2022-06-13 DIAGNOSIS — D631 Anemia in chronic kidney disease: Secondary | ICD-10-CM | POA: Diagnosis not present

## 2022-06-13 DIAGNOSIS — D509 Iron deficiency anemia, unspecified: Secondary | ICD-10-CM | POA: Diagnosis not present

## 2022-06-13 DIAGNOSIS — Z992 Dependence on renal dialysis: Secondary | ICD-10-CM | POA: Diagnosis not present

## 2022-06-16 DIAGNOSIS — D696 Thrombocytopenia, unspecified: Secondary | ICD-10-CM | POA: Diagnosis not present

## 2022-06-16 DIAGNOSIS — D689 Coagulation defect, unspecified: Secondary | ICD-10-CM | POA: Diagnosis not present

## 2022-06-16 DIAGNOSIS — D509 Iron deficiency anemia, unspecified: Secondary | ICD-10-CM | POA: Diagnosis not present

## 2022-06-16 DIAGNOSIS — N2581 Secondary hyperparathyroidism of renal origin: Secondary | ICD-10-CM | POA: Diagnosis not present

## 2022-06-16 DIAGNOSIS — D631 Anemia in chronic kidney disease: Secondary | ICD-10-CM | POA: Diagnosis not present

## 2022-06-16 DIAGNOSIS — N186 End stage renal disease: Secondary | ICD-10-CM | POA: Diagnosis not present

## 2022-06-16 DIAGNOSIS — Z992 Dependence on renal dialysis: Secondary | ICD-10-CM | POA: Diagnosis not present

## 2022-06-18 DIAGNOSIS — N2581 Secondary hyperparathyroidism of renal origin: Secondary | ICD-10-CM | POA: Diagnosis not present

## 2022-06-18 DIAGNOSIS — D631 Anemia in chronic kidney disease: Secondary | ICD-10-CM | POA: Diagnosis not present

## 2022-06-18 DIAGNOSIS — D696 Thrombocytopenia, unspecified: Secondary | ICD-10-CM | POA: Diagnosis not present

## 2022-06-18 DIAGNOSIS — D509 Iron deficiency anemia, unspecified: Secondary | ICD-10-CM | POA: Diagnosis not present

## 2022-06-18 DIAGNOSIS — Z992 Dependence on renal dialysis: Secondary | ICD-10-CM | POA: Diagnosis not present

## 2022-06-18 DIAGNOSIS — N186 End stage renal disease: Secondary | ICD-10-CM | POA: Diagnosis not present

## 2022-06-18 DIAGNOSIS — D689 Coagulation defect, unspecified: Secondary | ICD-10-CM | POA: Diagnosis not present

## 2022-06-21 DIAGNOSIS — N186 End stage renal disease: Secondary | ICD-10-CM | POA: Diagnosis not present

## 2022-06-21 DIAGNOSIS — D696 Thrombocytopenia, unspecified: Secondary | ICD-10-CM | POA: Diagnosis not present

## 2022-06-21 DIAGNOSIS — Z992 Dependence on renal dialysis: Secondary | ICD-10-CM | POA: Diagnosis not present

## 2022-06-21 DIAGNOSIS — N2581 Secondary hyperparathyroidism of renal origin: Secondary | ICD-10-CM | POA: Diagnosis not present

## 2022-06-21 DIAGNOSIS — D631 Anemia in chronic kidney disease: Secondary | ICD-10-CM | POA: Diagnosis not present

## 2022-06-21 DIAGNOSIS — D689 Coagulation defect, unspecified: Secondary | ICD-10-CM | POA: Diagnosis not present

## 2022-06-21 DIAGNOSIS — D509 Iron deficiency anemia, unspecified: Secondary | ICD-10-CM | POA: Diagnosis not present

## 2022-06-23 DIAGNOSIS — D696 Thrombocytopenia, unspecified: Secondary | ICD-10-CM | POA: Diagnosis not present

## 2022-06-23 DIAGNOSIS — N2581 Secondary hyperparathyroidism of renal origin: Secondary | ICD-10-CM | POA: Diagnosis not present

## 2022-06-23 DIAGNOSIS — D509 Iron deficiency anemia, unspecified: Secondary | ICD-10-CM | POA: Diagnosis not present

## 2022-06-23 DIAGNOSIS — N186 End stage renal disease: Secondary | ICD-10-CM | POA: Diagnosis not present

## 2022-06-23 DIAGNOSIS — D689 Coagulation defect, unspecified: Secondary | ICD-10-CM | POA: Diagnosis not present

## 2022-06-23 DIAGNOSIS — D631 Anemia in chronic kidney disease: Secondary | ICD-10-CM | POA: Diagnosis not present

## 2022-06-23 DIAGNOSIS — Z992 Dependence on renal dialysis: Secondary | ICD-10-CM | POA: Diagnosis not present

## 2022-06-25 DIAGNOSIS — Z992 Dependence on renal dialysis: Secondary | ICD-10-CM | POA: Diagnosis not present

## 2022-06-25 DIAGNOSIS — D689 Coagulation defect, unspecified: Secondary | ICD-10-CM | POA: Diagnosis not present

## 2022-06-25 DIAGNOSIS — N2581 Secondary hyperparathyroidism of renal origin: Secondary | ICD-10-CM | POA: Diagnosis not present

## 2022-06-25 DIAGNOSIS — D696 Thrombocytopenia, unspecified: Secondary | ICD-10-CM | POA: Diagnosis not present

## 2022-06-25 DIAGNOSIS — D631 Anemia in chronic kidney disease: Secondary | ICD-10-CM | POA: Diagnosis not present

## 2022-06-25 DIAGNOSIS — N186 End stage renal disease: Secondary | ICD-10-CM | POA: Diagnosis not present

## 2022-06-25 DIAGNOSIS — D509 Iron deficiency anemia, unspecified: Secondary | ICD-10-CM | POA: Diagnosis not present

## 2022-06-28 DIAGNOSIS — D689 Coagulation defect, unspecified: Secondary | ICD-10-CM | POA: Diagnosis not present

## 2022-06-28 DIAGNOSIS — N2581 Secondary hyperparathyroidism of renal origin: Secondary | ICD-10-CM | POA: Diagnosis not present

## 2022-06-28 DIAGNOSIS — D509 Iron deficiency anemia, unspecified: Secondary | ICD-10-CM | POA: Diagnosis not present

## 2022-06-28 DIAGNOSIS — H524 Presbyopia: Secondary | ICD-10-CM | POA: Diagnosis not present

## 2022-06-28 DIAGNOSIS — D696 Thrombocytopenia, unspecified: Secondary | ICD-10-CM | POA: Diagnosis not present

## 2022-06-28 DIAGNOSIS — N186 End stage renal disease: Secondary | ICD-10-CM | POA: Diagnosis not present

## 2022-06-28 DIAGNOSIS — D631 Anemia in chronic kidney disease: Secondary | ICD-10-CM | POA: Diagnosis not present

## 2022-06-28 DIAGNOSIS — E119 Type 2 diabetes mellitus without complications: Secondary | ICD-10-CM | POA: Diagnosis not present

## 2022-06-28 DIAGNOSIS — Z992 Dependence on renal dialysis: Secondary | ICD-10-CM | POA: Diagnosis not present

## 2022-06-29 ENCOUNTER — Encounter: Payer: Self-pay | Admitting: Internal Medicine

## 2022-06-29 ENCOUNTER — Ambulatory Visit (INDEPENDENT_AMBULATORY_CARE_PROVIDER_SITE_OTHER): Payer: Medicare Other | Admitting: Internal Medicine

## 2022-06-29 VITALS — BP 130/68 | HR 84 | Temp 98.4°F | Wt 189.4 lb

## 2022-06-29 DIAGNOSIS — J90 Pleural effusion, not elsewhere classified: Secondary | ICD-10-CM | POA: Diagnosis not present

## 2022-06-29 DIAGNOSIS — I272 Pulmonary hypertension, unspecified: Secondary | ICD-10-CM

## 2022-06-29 NOTE — Progress Notes (Signed)
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  referred to pulmonary clinic in [place]  04/21/2022 by Dr  Chase Caller for f/u R exudative effusion in setting of ihd/esrf HD x 2015 s/p CABG around 2012 and did fine until onset of R sided pain / fever 11/2020 dx   liver abscess assoc with small R effusion >  rx  IR drains and abx solved liver abscess but R effusion persisted and not tapped until  03/26/22 x 900 cc exudate with nl glucose, no wbc count, neg cytology > breathing improved but not to baseline   History of Present Illness  04/21/2022  Pulmonary/ 1st office eval/ Brigit Doke / [place] [facility]  Chief Complaint  Patient presents with   New Patient (Initial Visit)    Has seen Dr. Chase Caller in [place] and following up on tests   Dyspnea: c/o  tired p 30 yards/ legs get weak but really no limiting sob Cough: none  Sleep: bed is flat, one or two pillows  SABA use: none  02: none  R cw discomfort x sev months not pleuritic but positional Rec Incentive spirometry every hour x 4 breaths at a time  Please schedule a follow up office visit in 4 weeks, sooner if needed bring your spirometer  Add ono on RA for [place] ordered    05/18/2022  f/u ov/[place] office/Shontae Rosiles re:HD pt  R effusion/ [place]    Chief Complaint  Patient presents with   Follow-up    Doing well since last ov   Dyspnea:  tol more activity /shopping  Cough: no/ cp discomfort better  Sleeping: flat bed 1-2 pillows s resp cc  SABA use: none  02: none  Covid status: vax x 4  Getting IS above 1600 for the 1st time Rec My office will be contacting you by phone for referral for overnight oxygen monitoring   - if you don't hear back from my office within one week please call us back or notify us thru MyChart and we'll address it right away.  Keep using your spirometer at least 4 x daily , and 4 x in a row when you use it    06/29/2022  f/u ov/[place]  office/Tadarius Maland re: R effusion p liver abscess    Chief Complaint  Patient presents with   Follow-up    Feels breathing si okay but has a lot of fatigue  Dyspnea:  ok but sometimes light headed and near syncope  Cough: none  Sleeping: flat bed 1 pillow no resp cc  SABA use: none - not using IS  02: none         No obvious day to day or daytime variability or assoc excess/ purulent sputum or mucus plugs or hemoptysis or cp or chest tightness, subjective wheeze or overt  hb symptoms.   Sleeping as above without nocturnal  or early am exacerbation  of respiratory  c/o's or need for noct saba. Also denies any obvious fluctuation of symptoms with weather or environmental changes or other aggravating or alleviating factors except as outlined above   No unusual exposure hx or h/o childhood pna/ asthma or knowledge of premature birth.  Current Allergies, Complete Past Medical History, Past Surgical History, Family History, and Social History were reviewed in [employer] record.  ROS  The following are not active complaints unless bolded Hoarseness, sore throat, dysphagia, dental  problems, itching, sneezing,  nasal congestion or discharge of excess watery  mucus or purulent secretions, ear ache,   fever, chills, sweats, unintended wt loss or wt gain, classically pleuritic or exertional cp,  orthopnea pnd or arm/hand swelling  or leg swelling, presyncope, palpitations, abdominal pain, anorexia, nausea, vomiting, diarrhea  or change in bowel habits or change in bladder habits, change in stools or change in urine, dysuria, hematuria,  rash, arthralgias, visual complaints, headache, numbness, weakness or ataxia or problems with walking or coordination,  change in mood or  memory.        Current Meds  Medication Sig   acetaminophen (TYLENOL) 500 MG tablet Take 325 mg by mouth every 6 (six) hours as needed for mild pain or moderate pain.   amitriptyline (ELAVIL) 25 MG tablet Take 25  mg by mouth at bedtime.   aspirin EC 325 MG tablet Take '325mg'$ s once daily on non dialysis days. Dialysis days are Mon, Wed, Fri   atorvastatin (LIPITOR) 80 MG tablet Take 1 tablet (80 mg total) by mouth daily at 6 PM.   carvedilol (COREG) 25 MG tablet Take 1 tablet (25 mg total) by mouth 2 (two) times daily.   cloNIDine (CATAPRES) 0.2 MG tablet Take 1 tablet (0.2 mg total) by mouth 2 (two) times daily.   diphenhydramine-acetaminophen (TYLENOL PM) 25-500 MG TABS tablet Take 1 tablet by mouth at bedtime as needed (sleep).   hydrALAZINE (APRESOLINE) 50 MG tablet Take 1 tablet (50 mg total) by mouth 3 (three) times daily.   insulin glargine (LANTUS) 100 UNIT/ML injection Inject 10-15 Units into the skin at bedtime. Sliding scale over 200   latanoprost (XALATAN) 0.005 % ophthalmic solution Place 2 drops into both eyes 2 (two) times daily.   minoxidil (LONITEN) 10 MG tablet Take 0.5 tablets (5 mg total) by mouth at bedtime. Patient does not take as prescribed. Sometimes takes a 1/2 tablet depending on BP readings   Multiple Vitamin (DAILY VITAMIN PO) Take 1 tablet by mouth daily as needed (supplement).   Omega-3 Fatty Acids (FISH OIL) 1000 MG CAPS Take 1,000 mg by mouth daily.   OVER THE COUNTER MEDICATION Take 1 tablet by mouth daily. Beet Root   sevelamer carbonate (RENVELA) 800 MG tablet 1 tablet with meals                          Past Medical History:  Diagnosis Date   Arthritis    CAD (coronary artery disease)    STEMI with LAD stent by C Granger in 2005   Dependence on renal dialysis ([place])    Depression    Diabetes mellitus without complication ([place])    History of kidney stones    Hypertension    Kidney infection    MI (mitral incompetence)    Myocardial infarction ([facility])    Seasonal allergies        Objective:    06/29/2022       189  05/18/22 186 lb 6.4 oz (84.6 kg)  04/21/22 185 lb 6.4 oz (84.1 kg)  03/16/22 192 lb (87.1 kg)    Vital signs reviewed  06/29/2022  - Note  at rest 02 sats  94% on RA   General appearance:    amb pleasant bm nad / dullness R base with decreased bs       HEENT : Oropharynx  clear     Nasal turbinates mild non specific swelling bilaterally    NECK :  without  apparent JVD/ palpable Nodes/TM    LUNGS: no acc muscle use,  Nl contour chest which is clear to A and P bilaterally without cough on insp or exp maneuvers   CV:  RRR  no s3  2/6 SEM/ slt increase in P2, and no edema   ABD:  soft and nontender with nl inspiratory excursion in the supine position. No bruits or organomegaly appreciated   MS:  Nl gait/ ext warm without deformities Or obvious joint restrictions  calf tenderness, cyanosis or clubbing    SKIN: warm and dry without lesions    NEURO:  alert, approp, nl sensorium with  no motor or cerebellar deficits apparent.      Cxr rec 06/29/2022 > did not go to xray Assessment

## 2022-06-29 NOTE — Patient Instructions (Addendum)
My office will be contacting you by phone for referral to overnight pulse oximetry   - if you don't hear back from my office within one week please call us back or notify us thru MyChart and we'll address it right away.   Please remember to go to the  x-ray department  @  [facility] for your tests - we will call you with the results when they are available      Please schedule a follow up visit in 3 months but call sooner if needed  Late add:  once 02 sats noct are assured then need to f/u with echo

## 2022-06-30 ENCOUNTER — Encounter: Payer: Self-pay | Admitting: Internal Medicine

## 2022-06-30 DIAGNOSIS — N2581 Secondary hyperparathyroidism of renal origin: Secondary | ICD-10-CM | POA: Diagnosis not present

## 2022-06-30 DIAGNOSIS — N186 End stage renal disease: Secondary | ICD-10-CM | POA: Diagnosis not present

## 2022-06-30 DIAGNOSIS — Z992 Dependence on renal dialysis: Secondary | ICD-10-CM | POA: Diagnosis not present

## 2022-06-30 DIAGNOSIS — D689 Coagulation defect, unspecified: Secondary | ICD-10-CM | POA: Diagnosis not present

## 2022-06-30 DIAGNOSIS — D509 Iron deficiency anemia, unspecified: Secondary | ICD-10-CM | POA: Diagnosis not present

## 2022-06-30 DIAGNOSIS — D631 Anemia in chronic kidney disease: Secondary | ICD-10-CM | POA: Diagnosis not present

## 2022-06-30 DIAGNOSIS — D696 Thrombocytopenia, unspecified: Secondary | ICD-10-CM | POA: Diagnosis not present

## 2022-06-30 NOTE — Assessment & Plan Note (Signed)
See echo 59/93/57    G2 diastolic dysfunction and mild AS/ LAE/RAE with est PAS 58  - ONO on RA 04/22/2022 >>> not done 05/18/2022 >> repeat request 06/29/2022    1st need to be sure has adequate sats noct and f/u with Echo in 3 m          Each maintenance medication was reviewed in detail including emphasizing most importantly the difference between maintenance and prns and under what circumstances the prns are to be triggered using an action plan format where appropriate.  Total time for H and P, chart review, counseling,   and generating customized AVS unique to this office visit / same day charting = 33 min

## 2022-06-30 NOTE — Assessment & Plan Note (Addendum)
Onset 11/2020 with assoc liver abscess - R thoracentesis 03/26/22 x 900 cc/ neg cyt exudate/ no wbc/ nl glucose - CT p tap 03/26/22  1. Decreased small to moderate loculated RIGHT pleural effusion since 03/05/2022. Associated RIGHT mid and lower lung consolidation/atelectasis is relatively unchanged. 2. Subsegmental atelectasis/scarring throughout the RIGHT lung and LEFT lower lobe again noted. - 04/21/2022   Walked on RA  x  3  lap(s) =  approx 450  ft  @ mod pace, stopped due to end of study with lowest 02 sats 94% and tired >  sob at end   Not clinically changed since last ov on conservative rx = keep dry on HD as tol > rec f/u 3 month with cxr on return, sooner prn

## 2022-07-02 DIAGNOSIS — Z992 Dependence on renal dialysis: Secondary | ICD-10-CM | POA: Diagnosis not present

## 2022-07-02 DIAGNOSIS — N186 End stage renal disease: Secondary | ICD-10-CM | POA: Diagnosis not present

## 2022-07-02 DIAGNOSIS — D509 Iron deficiency anemia, unspecified: Secondary | ICD-10-CM | POA: Diagnosis not present

## 2022-07-02 DIAGNOSIS — D696 Thrombocytopenia, unspecified: Secondary | ICD-10-CM | POA: Diagnosis not present

## 2022-07-02 DIAGNOSIS — N2581 Secondary hyperparathyroidism of renal origin: Secondary | ICD-10-CM | POA: Diagnosis not present

## 2022-07-02 DIAGNOSIS — D689 Coagulation defect, unspecified: Secondary | ICD-10-CM | POA: Diagnosis not present

## 2022-07-02 DIAGNOSIS — D631 Anemia in chronic kidney disease: Secondary | ICD-10-CM | POA: Diagnosis not present

## 2022-07-05 DIAGNOSIS — D696 Thrombocytopenia, unspecified: Secondary | ICD-10-CM | POA: Diagnosis not present

## 2022-07-05 DIAGNOSIS — D631 Anemia in chronic kidney disease: Secondary | ICD-10-CM | POA: Diagnosis not present

## 2022-07-05 DIAGNOSIS — Z992 Dependence on renal dialysis: Secondary | ICD-10-CM | POA: Diagnosis not present

## 2022-07-05 DIAGNOSIS — D509 Iron deficiency anemia, unspecified: Secondary | ICD-10-CM | POA: Diagnosis not present

## 2022-07-05 DIAGNOSIS — D689 Coagulation defect, unspecified: Secondary | ICD-10-CM | POA: Diagnosis not present

## 2022-07-05 DIAGNOSIS — N186 End stage renal disease: Secondary | ICD-10-CM | POA: Diagnosis not present

## 2022-07-05 DIAGNOSIS — N2581 Secondary hyperparathyroidism of renal origin: Secondary | ICD-10-CM | POA: Diagnosis not present

## 2022-07-07 DIAGNOSIS — D631 Anemia in chronic kidney disease: Secondary | ICD-10-CM | POA: Diagnosis not present

## 2022-07-07 DIAGNOSIS — N2581 Secondary hyperparathyroidism of renal origin: Secondary | ICD-10-CM | POA: Diagnosis not present

## 2022-07-07 DIAGNOSIS — D509 Iron deficiency anemia, unspecified: Secondary | ICD-10-CM | POA: Diagnosis not present

## 2022-07-07 DIAGNOSIS — D696 Thrombocytopenia, unspecified: Secondary | ICD-10-CM | POA: Diagnosis not present

## 2022-07-07 DIAGNOSIS — N186 End stage renal disease: Secondary | ICD-10-CM | POA: Diagnosis not present

## 2022-07-07 DIAGNOSIS — Z992 Dependence on renal dialysis: Secondary | ICD-10-CM | POA: Diagnosis not present

## 2022-07-07 DIAGNOSIS — D689 Coagulation defect, unspecified: Secondary | ICD-10-CM | POA: Diagnosis not present

## 2022-07-09 DIAGNOSIS — N186 End stage renal disease: Secondary | ICD-10-CM | POA: Diagnosis not present

## 2022-07-09 DIAGNOSIS — N2581 Secondary hyperparathyroidism of renal origin: Secondary | ICD-10-CM | POA: Diagnosis not present

## 2022-07-09 DIAGNOSIS — D509 Iron deficiency anemia, unspecified: Secondary | ICD-10-CM | POA: Diagnosis not present

## 2022-07-09 DIAGNOSIS — D689 Coagulation defect, unspecified: Secondary | ICD-10-CM | POA: Diagnosis not present

## 2022-07-09 DIAGNOSIS — D631 Anemia in chronic kidney disease: Secondary | ICD-10-CM | POA: Diagnosis not present

## 2022-07-09 DIAGNOSIS — Z992 Dependence on renal dialysis: Secondary | ICD-10-CM | POA: Diagnosis not present

## 2022-07-09 DIAGNOSIS — D696 Thrombocytopenia, unspecified: Secondary | ICD-10-CM | POA: Diagnosis not present

## 2022-07-12 DIAGNOSIS — Z992 Dependence on renal dialysis: Secondary | ICD-10-CM | POA: Diagnosis not present

## 2022-07-12 DIAGNOSIS — D689 Coagulation defect, unspecified: Secondary | ICD-10-CM | POA: Diagnosis not present

## 2022-07-12 DIAGNOSIS — N2581 Secondary hyperparathyroidism of renal origin: Secondary | ICD-10-CM | POA: Diagnosis not present

## 2022-07-12 DIAGNOSIS — N186 End stage renal disease: Secondary | ICD-10-CM | POA: Diagnosis not present

## 2022-07-12 DIAGNOSIS — D631 Anemia in chronic kidney disease: Secondary | ICD-10-CM | POA: Diagnosis not present

## 2022-07-12 DIAGNOSIS — D696 Thrombocytopenia, unspecified: Secondary | ICD-10-CM | POA: Diagnosis not present

## 2022-07-12 DIAGNOSIS — D509 Iron deficiency anemia, unspecified: Secondary | ICD-10-CM | POA: Diagnosis not present

## 2022-07-14 DIAGNOSIS — Z992 Dependence on renal dialysis: Secondary | ICD-10-CM | POA: Diagnosis not present

## 2022-07-14 DIAGNOSIS — N2581 Secondary hyperparathyroidism of renal origin: Secondary | ICD-10-CM | POA: Diagnosis not present

## 2022-07-14 DIAGNOSIS — D696 Thrombocytopenia, unspecified: Secondary | ICD-10-CM | POA: Diagnosis not present

## 2022-07-14 DIAGNOSIS — D509 Iron deficiency anemia, unspecified: Secondary | ICD-10-CM | POA: Diagnosis not present

## 2022-07-14 DIAGNOSIS — D631 Anemia in chronic kidney disease: Secondary | ICD-10-CM | POA: Diagnosis not present

## 2022-07-14 DIAGNOSIS — D689 Coagulation defect, unspecified: Secondary | ICD-10-CM | POA: Diagnosis not present

## 2022-07-14 DIAGNOSIS — N186 End stage renal disease: Secondary | ICD-10-CM | POA: Diagnosis not present

## 2022-07-16 DIAGNOSIS — D689 Coagulation defect, unspecified: Secondary | ICD-10-CM | POA: Diagnosis not present

## 2022-07-16 DIAGNOSIS — D631 Anemia in chronic kidney disease: Secondary | ICD-10-CM | POA: Diagnosis not present

## 2022-07-16 DIAGNOSIS — Z992 Dependence on renal dialysis: Secondary | ICD-10-CM | POA: Diagnosis not present

## 2022-07-16 DIAGNOSIS — D509 Iron deficiency anemia, unspecified: Secondary | ICD-10-CM | POA: Diagnosis not present

## 2022-07-16 DIAGNOSIS — N186 End stage renal disease: Secondary | ICD-10-CM | POA: Diagnosis not present

## 2022-07-16 DIAGNOSIS — D696 Thrombocytopenia, unspecified: Secondary | ICD-10-CM | POA: Diagnosis not present

## 2022-07-16 DIAGNOSIS — N2581 Secondary hyperparathyroidism of renal origin: Secondary | ICD-10-CM | POA: Diagnosis not present

## 2022-07-19 DIAGNOSIS — D631 Anemia in chronic kidney disease: Secondary | ICD-10-CM | POA: Diagnosis not present

## 2022-07-19 DIAGNOSIS — D689 Coagulation defect, unspecified: Secondary | ICD-10-CM | POA: Diagnosis not present

## 2022-07-19 DIAGNOSIS — N2581 Secondary hyperparathyroidism of renal origin: Secondary | ICD-10-CM | POA: Diagnosis not present

## 2022-07-19 DIAGNOSIS — N186 End stage renal disease: Secondary | ICD-10-CM | POA: Diagnosis not present

## 2022-07-19 DIAGNOSIS — D509 Iron deficiency anemia, unspecified: Secondary | ICD-10-CM | POA: Diagnosis not present

## 2022-07-19 DIAGNOSIS — D696 Thrombocytopenia, unspecified: Secondary | ICD-10-CM | POA: Diagnosis not present

## 2022-07-19 DIAGNOSIS — Z992 Dependence on renal dialysis: Secondary | ICD-10-CM | POA: Diagnosis not present

## 2022-07-20 ENCOUNTER — Encounter: Payer: Self-pay | Admitting: Podiatry

## 2022-07-20 ENCOUNTER — Ambulatory Visit: Payer: Medicare Other | Admitting: Podiatry

## 2022-07-20 VITALS — BP 215/94 | HR 86

## 2022-07-20 DIAGNOSIS — E119 Type 2 diabetes mellitus without complications: Secondary | ICD-10-CM

## 2022-07-20 DIAGNOSIS — M79674 Pain in right toe(s): Secondary | ICD-10-CM | POA: Diagnosis not present

## 2022-07-20 DIAGNOSIS — B351 Tinea unguium: Secondary | ICD-10-CM

## 2022-07-20 DIAGNOSIS — M79675 Pain in left toe(s): Secondary | ICD-10-CM | POA: Diagnosis not present

## 2022-07-20 NOTE — Progress Notes (Signed)
   Chief Complaint  Patient presents with   Nail Problem    "My toenail on my left foot came off.  I'm Diabetic." N - toenail  L - hallux lt D - 1 week O - gradually C - toenail came off A - socks, shoes T - ointment given by the medical center    SUBJECTIVE Patient with a history of diabetes mellitus presents to office today complaining of elongated, thickened nails that cause pain while ambulating in shoes.  Patient is unable to trim their own nails. Patient is here for further evaluation and treatment.  Past Medical History:  Diagnosis Date   Arthritis    CAD (coronary artery disease)    STEMI with LAD stent by C Granger in 2005   Dependence on renal dialysis ([place])    Depression    Diabetes mellitus without complication ([place])    History of kidney stones    Hypertension    Kidney infection    MI (mitral incompetence)    Myocardial infarction ([place])    Seasonal allergies     Allergies  Allergen Reactions   Shellfish Allergy Hives     OBJECTIVE General Patient is awake, alert, and oriented x 3 and in no acute distress. Derm Skin is dry and supple bilateral. Negative open lesions or macerations. Remaining integument unremarkable. Nails are tender, long, thickened and dystrophic with subungual debris, consistent with onychomycosis, 1-5 bilateral. No signs of infection noted. Vasc  DP and PT pedal pulses palpable bilaterally. Temperature gradient within normal limits.  Neuro Epicritic and protective threshold sensation diminished bilaterally.  Musculoskeletal Exam No symptomatic pedal deformities noted bilateral. Muscular strength within normal limits.  ASSESSMENT 1. Diabetes Mellitus w/ peripheral neuropathy 2.  Pain due to onychomycosis of toenails bilateral  PLAN OF CARE 1. Patient evaluated today.  Comprehensive diabetic foot exam performed today 2. Instructed to maintain good pedal hygiene and foot care. Stressed importance of controlling blood sugar.  3.  Mechanical debridement of nails 1-5 bilaterally performed using a nail nipper. Filed with dremel without incident.  4. Return to clinic in 3 mos.     Edrick Kins, DPM [facility]  Dr. Edrick Kins, DPM    [address], [place] [address]                Office [phone]  Fax [phone]

## 2022-07-21 DIAGNOSIS — N2581 Secondary hyperparathyroidism of renal origin: Secondary | ICD-10-CM | POA: Diagnosis not present

## 2022-07-21 DIAGNOSIS — D631 Anemia in chronic kidney disease: Secondary | ICD-10-CM | POA: Diagnosis not present

## 2022-07-21 DIAGNOSIS — D509 Iron deficiency anemia, unspecified: Secondary | ICD-10-CM | POA: Diagnosis not present

## 2022-07-21 DIAGNOSIS — D696 Thrombocytopenia, unspecified: Secondary | ICD-10-CM | POA: Diagnosis not present

## 2022-07-21 DIAGNOSIS — N186 End stage renal disease: Secondary | ICD-10-CM | POA: Diagnosis not present

## 2022-07-21 DIAGNOSIS — Z992 Dependence on renal dialysis: Secondary | ICD-10-CM | POA: Diagnosis not present

## 2022-07-21 DIAGNOSIS — D689 Coagulation defect, unspecified: Secondary | ICD-10-CM | POA: Diagnosis not present

## 2022-07-23 DIAGNOSIS — D509 Iron deficiency anemia, unspecified: Secondary | ICD-10-CM | POA: Diagnosis not present

## 2022-07-23 DIAGNOSIS — D631 Anemia in chronic kidney disease: Secondary | ICD-10-CM | POA: Diagnosis not present

## 2022-07-23 DIAGNOSIS — D689 Coagulation defect, unspecified: Secondary | ICD-10-CM | POA: Diagnosis not present

## 2022-07-23 DIAGNOSIS — Z992 Dependence on renal dialysis: Secondary | ICD-10-CM | POA: Diagnosis not present

## 2022-07-23 DIAGNOSIS — D696 Thrombocytopenia, unspecified: Secondary | ICD-10-CM | POA: Diagnosis not present

## 2022-07-23 DIAGNOSIS — N186 End stage renal disease: Secondary | ICD-10-CM | POA: Diagnosis not present

## 2022-07-23 DIAGNOSIS — N2581 Secondary hyperparathyroidism of renal origin: Secondary | ICD-10-CM | POA: Diagnosis not present

## 2022-07-26 DIAGNOSIS — D696 Thrombocytopenia, unspecified: Secondary | ICD-10-CM | POA: Diagnosis not present

## 2022-07-26 DIAGNOSIS — N186 End stage renal disease: Secondary | ICD-10-CM | POA: Diagnosis not present

## 2022-07-26 DIAGNOSIS — D509 Iron deficiency anemia, unspecified: Secondary | ICD-10-CM | POA: Diagnosis not present

## 2022-07-26 DIAGNOSIS — D689 Coagulation defect, unspecified: Secondary | ICD-10-CM | POA: Diagnosis not present

## 2022-07-26 DIAGNOSIS — D631 Anemia in chronic kidney disease: Secondary | ICD-10-CM | POA: Diagnosis not present

## 2022-07-26 DIAGNOSIS — Z992 Dependence on renal dialysis: Secondary | ICD-10-CM | POA: Diagnosis not present

## 2022-07-26 DIAGNOSIS — N2581 Secondary hyperparathyroidism of renal origin: Secondary | ICD-10-CM | POA: Diagnosis not present

## 2022-07-28 DIAGNOSIS — D631 Anemia in chronic kidney disease: Secondary | ICD-10-CM | POA: Diagnosis not present

## 2022-07-28 DIAGNOSIS — N2581 Secondary hyperparathyroidism of renal origin: Secondary | ICD-10-CM | POA: Diagnosis not present

## 2022-07-28 DIAGNOSIS — D696 Thrombocytopenia, unspecified: Secondary | ICD-10-CM | POA: Diagnosis not present

## 2022-07-28 DIAGNOSIS — N186 End stage renal disease: Secondary | ICD-10-CM | POA: Diagnosis not present

## 2022-07-28 DIAGNOSIS — D509 Iron deficiency anemia, unspecified: Secondary | ICD-10-CM | POA: Diagnosis not present

## 2022-07-28 DIAGNOSIS — Z992 Dependence on renal dialysis: Secondary | ICD-10-CM | POA: Diagnosis not present

## 2022-07-28 DIAGNOSIS — D689 Coagulation defect, unspecified: Secondary | ICD-10-CM | POA: Diagnosis not present

## 2022-07-30 DIAGNOSIS — D696 Thrombocytopenia, unspecified: Secondary | ICD-10-CM | POA: Diagnosis not present

## 2022-07-30 DIAGNOSIS — D631 Anemia in chronic kidney disease: Secondary | ICD-10-CM | POA: Diagnosis not present

## 2022-07-30 DIAGNOSIS — N186 End stage renal disease: Secondary | ICD-10-CM | POA: Diagnosis not present

## 2022-07-30 DIAGNOSIS — D689 Coagulation defect, unspecified: Secondary | ICD-10-CM | POA: Diagnosis not present

## 2022-07-30 DIAGNOSIS — D509 Iron deficiency anemia, unspecified: Secondary | ICD-10-CM | POA: Diagnosis not present

## 2022-07-30 DIAGNOSIS — Z992 Dependence on renal dialysis: Secondary | ICD-10-CM | POA: Diagnosis not present

## 2022-07-30 DIAGNOSIS — N2581 Secondary hyperparathyroidism of renal origin: Secondary | ICD-10-CM | POA: Diagnosis not present

## 2022-08-02 DIAGNOSIS — D696 Thrombocytopenia, unspecified: Secondary | ICD-10-CM | POA: Diagnosis not present

## 2022-08-02 DIAGNOSIS — D509 Iron deficiency anemia, unspecified: Secondary | ICD-10-CM | POA: Diagnosis not present

## 2022-08-02 DIAGNOSIS — Z992 Dependence on renal dialysis: Secondary | ICD-10-CM | POA: Diagnosis not present

## 2022-08-02 DIAGNOSIS — N2581 Secondary hyperparathyroidism of renal origin: Secondary | ICD-10-CM | POA: Diagnosis not present

## 2022-08-02 DIAGNOSIS — N186 End stage renal disease: Secondary | ICD-10-CM | POA: Diagnosis not present

## 2022-08-02 DIAGNOSIS — D631 Anemia in chronic kidney disease: Secondary | ICD-10-CM | POA: Diagnosis not present

## 2022-08-02 DIAGNOSIS — D689 Coagulation defect, unspecified: Secondary | ICD-10-CM | POA: Diagnosis not present

## 2022-08-04 DIAGNOSIS — D631 Anemia in chronic kidney disease: Secondary | ICD-10-CM | POA: Diagnosis not present

## 2022-08-04 DIAGNOSIS — Z992 Dependence on renal dialysis: Secondary | ICD-10-CM | POA: Diagnosis not present

## 2022-08-04 DIAGNOSIS — D689 Coagulation defect, unspecified: Secondary | ICD-10-CM | POA: Diagnosis not present

## 2022-08-04 DIAGNOSIS — N2581 Secondary hyperparathyroidism of renal origin: Secondary | ICD-10-CM | POA: Diagnosis not present

## 2022-08-04 DIAGNOSIS — N186 End stage renal disease: Secondary | ICD-10-CM | POA: Diagnosis not present

## 2022-08-04 DIAGNOSIS — D509 Iron deficiency anemia, unspecified: Secondary | ICD-10-CM | POA: Diagnosis not present

## 2022-08-04 DIAGNOSIS — D696 Thrombocytopenia, unspecified: Secondary | ICD-10-CM | POA: Diagnosis not present

## 2022-08-06 DIAGNOSIS — D696 Thrombocytopenia, unspecified: Secondary | ICD-10-CM | POA: Diagnosis not present

## 2022-08-06 DIAGNOSIS — N186 End stage renal disease: Secondary | ICD-10-CM | POA: Diagnosis not present

## 2022-08-06 DIAGNOSIS — Z992 Dependence on renal dialysis: Secondary | ICD-10-CM | POA: Diagnosis not present

## 2022-08-06 DIAGNOSIS — D689 Coagulation defect, unspecified: Secondary | ICD-10-CM | POA: Diagnosis not present

## 2022-08-06 DIAGNOSIS — N2581 Secondary hyperparathyroidism of renal origin: Secondary | ICD-10-CM | POA: Diagnosis not present

## 2022-08-06 DIAGNOSIS — D509 Iron deficiency anemia, unspecified: Secondary | ICD-10-CM | POA: Diagnosis not present

## 2022-08-06 DIAGNOSIS — D631 Anemia in chronic kidney disease: Secondary | ICD-10-CM | POA: Diagnosis not present

## 2022-08-09 DIAGNOSIS — N186 End stage renal disease: Secondary | ICD-10-CM | POA: Diagnosis not present

## 2022-08-09 DIAGNOSIS — Z992 Dependence on renal dialysis: Secondary | ICD-10-CM | POA: Diagnosis not present

## 2022-08-09 DIAGNOSIS — D631 Anemia in chronic kidney disease: Secondary | ICD-10-CM | POA: Diagnosis not present

## 2022-08-09 DIAGNOSIS — D696 Thrombocytopenia, unspecified: Secondary | ICD-10-CM | POA: Diagnosis not present

## 2022-08-09 DIAGNOSIS — D509 Iron deficiency anemia, unspecified: Secondary | ICD-10-CM | POA: Diagnosis not present

## 2022-08-09 DIAGNOSIS — N2581 Secondary hyperparathyroidism of renal origin: Secondary | ICD-10-CM | POA: Diagnosis not present

## 2022-08-09 DIAGNOSIS — D689 Coagulation defect, unspecified: Secondary | ICD-10-CM | POA: Diagnosis not present

## 2022-08-11 DIAGNOSIS — N2581 Secondary hyperparathyroidism of renal origin: Secondary | ICD-10-CM | POA: Diagnosis not present

## 2022-08-11 DIAGNOSIS — D509 Iron deficiency anemia, unspecified: Secondary | ICD-10-CM | POA: Diagnosis not present

## 2022-08-11 DIAGNOSIS — N186 End stage renal disease: Secondary | ICD-10-CM | POA: Diagnosis not present

## 2022-08-11 DIAGNOSIS — Z992 Dependence on renal dialysis: Secondary | ICD-10-CM | POA: Diagnosis not present

## 2022-08-11 DIAGNOSIS — D631 Anemia in chronic kidney disease: Secondary | ICD-10-CM | POA: Diagnosis not present

## 2022-08-11 DIAGNOSIS — D689 Coagulation defect, unspecified: Secondary | ICD-10-CM | POA: Diagnosis not present

## 2022-08-11 DIAGNOSIS — D696 Thrombocytopenia, unspecified: Secondary | ICD-10-CM | POA: Diagnosis not present

## 2022-08-12 DIAGNOSIS — Z992 Dependence on renal dialysis: Secondary | ICD-10-CM | POA: Diagnosis not present

## 2022-08-12 DIAGNOSIS — N186 End stage renal disease: Secondary | ICD-10-CM | POA: Diagnosis not present

## 2022-08-13 DIAGNOSIS — D509 Iron deficiency anemia, unspecified: Secondary | ICD-10-CM | POA: Diagnosis not present

## 2022-08-13 DIAGNOSIS — D689 Coagulation defect, unspecified: Secondary | ICD-10-CM | POA: Diagnosis not present

## 2022-08-13 DIAGNOSIS — N186 End stage renal disease: Secondary | ICD-10-CM | POA: Diagnosis not present

## 2022-08-13 DIAGNOSIS — N2581 Secondary hyperparathyroidism of renal origin: Secondary | ICD-10-CM | POA: Diagnosis not present

## 2022-08-13 DIAGNOSIS — D696 Thrombocytopenia, unspecified: Secondary | ICD-10-CM | POA: Diagnosis not present

## 2022-08-13 DIAGNOSIS — Z992 Dependence on renal dialysis: Secondary | ICD-10-CM | POA: Diagnosis not present

## 2022-08-16 DIAGNOSIS — D696 Thrombocytopenia, unspecified: Secondary | ICD-10-CM | POA: Diagnosis not present

## 2022-08-16 DIAGNOSIS — N2581 Secondary hyperparathyroidism of renal origin: Secondary | ICD-10-CM | POA: Diagnosis not present

## 2022-08-16 DIAGNOSIS — N186 End stage renal disease: Secondary | ICD-10-CM | POA: Diagnosis not present

## 2022-08-16 DIAGNOSIS — D689 Coagulation defect, unspecified: Secondary | ICD-10-CM | POA: Diagnosis not present

## 2022-08-16 DIAGNOSIS — D509 Iron deficiency anemia, unspecified: Secondary | ICD-10-CM | POA: Diagnosis not present

## 2022-08-16 DIAGNOSIS — Z992 Dependence on renal dialysis: Secondary | ICD-10-CM | POA: Diagnosis not present

## 2022-08-18 DIAGNOSIS — D509 Iron deficiency anemia, unspecified: Secondary | ICD-10-CM | POA: Diagnosis not present

## 2022-08-18 DIAGNOSIS — N2581 Secondary hyperparathyroidism of renal origin: Secondary | ICD-10-CM | POA: Diagnosis not present

## 2022-08-18 DIAGNOSIS — N186 End stage renal disease: Secondary | ICD-10-CM | POA: Diagnosis not present

## 2022-08-18 DIAGNOSIS — D689 Coagulation defect, unspecified: Secondary | ICD-10-CM | POA: Diagnosis not present

## 2022-08-18 DIAGNOSIS — D696 Thrombocytopenia, unspecified: Secondary | ICD-10-CM | POA: Diagnosis not present

## 2022-08-18 DIAGNOSIS — Z992 Dependence on renal dialysis: Secondary | ICD-10-CM | POA: Diagnosis not present

## 2022-08-20 DIAGNOSIS — D509 Iron deficiency anemia, unspecified: Secondary | ICD-10-CM | POA: Diagnosis not present

## 2022-08-20 DIAGNOSIS — Z992 Dependence on renal dialysis: Secondary | ICD-10-CM | POA: Diagnosis not present

## 2022-08-20 DIAGNOSIS — D689 Coagulation defect, unspecified: Secondary | ICD-10-CM | POA: Diagnosis not present

## 2022-08-20 DIAGNOSIS — N186 End stage renal disease: Secondary | ICD-10-CM | POA: Diagnosis not present

## 2022-08-20 DIAGNOSIS — D696 Thrombocytopenia, unspecified: Secondary | ICD-10-CM | POA: Diagnosis not present

## 2022-08-20 DIAGNOSIS — N2581 Secondary hyperparathyroidism of renal origin: Secondary | ICD-10-CM | POA: Diagnosis not present

## 2022-08-23 DIAGNOSIS — N2581 Secondary hyperparathyroidism of renal origin: Secondary | ICD-10-CM | POA: Diagnosis not present

## 2022-08-23 DIAGNOSIS — D509 Iron deficiency anemia, unspecified: Secondary | ICD-10-CM | POA: Diagnosis not present

## 2022-08-23 DIAGNOSIS — N186 End stage renal disease: Secondary | ICD-10-CM | POA: Diagnosis not present

## 2022-08-23 DIAGNOSIS — Z992 Dependence on renal dialysis: Secondary | ICD-10-CM | POA: Diagnosis not present

## 2022-08-23 DIAGNOSIS — D689 Coagulation defect, unspecified: Secondary | ICD-10-CM | POA: Diagnosis not present

## 2022-08-23 DIAGNOSIS — D696 Thrombocytopenia, unspecified: Secondary | ICD-10-CM | POA: Diagnosis not present

## 2022-08-25 DIAGNOSIS — D696 Thrombocytopenia, unspecified: Secondary | ICD-10-CM | POA: Diagnosis not present

## 2022-08-25 DIAGNOSIS — N2581 Secondary hyperparathyroidism of renal origin: Secondary | ICD-10-CM | POA: Diagnosis not present

## 2022-08-25 DIAGNOSIS — D689 Coagulation defect, unspecified: Secondary | ICD-10-CM | POA: Diagnosis not present

## 2022-08-25 DIAGNOSIS — N186 End stage renal disease: Secondary | ICD-10-CM | POA: Diagnosis not present

## 2022-08-25 DIAGNOSIS — Z992 Dependence on renal dialysis: Secondary | ICD-10-CM | POA: Diagnosis not present

## 2022-08-25 DIAGNOSIS — D509 Iron deficiency anemia, unspecified: Secondary | ICD-10-CM | POA: Diagnosis not present

## 2022-08-27 DIAGNOSIS — D696 Thrombocytopenia, unspecified: Secondary | ICD-10-CM | POA: Diagnosis not present

## 2022-08-27 DIAGNOSIS — Z992 Dependence on renal dialysis: Secondary | ICD-10-CM | POA: Diagnosis not present

## 2022-08-27 DIAGNOSIS — N186 End stage renal disease: Secondary | ICD-10-CM | POA: Diagnosis not present

## 2022-08-27 DIAGNOSIS — D689 Coagulation defect, unspecified: Secondary | ICD-10-CM | POA: Diagnosis not present

## 2022-08-27 DIAGNOSIS — N2581 Secondary hyperparathyroidism of renal origin: Secondary | ICD-10-CM | POA: Diagnosis not present

## 2022-08-27 DIAGNOSIS — D509 Iron deficiency anemia, unspecified: Secondary | ICD-10-CM | POA: Diagnosis not present

## 2022-08-30 DIAGNOSIS — D689 Coagulation defect, unspecified: Secondary | ICD-10-CM | POA: Diagnosis not present

## 2022-08-30 DIAGNOSIS — Z992 Dependence on renal dialysis: Secondary | ICD-10-CM | POA: Diagnosis not present

## 2022-08-30 DIAGNOSIS — N2581 Secondary hyperparathyroidism of renal origin: Secondary | ICD-10-CM | POA: Diagnosis not present

## 2022-08-30 DIAGNOSIS — D696 Thrombocytopenia, unspecified: Secondary | ICD-10-CM | POA: Diagnosis not present

## 2022-08-30 DIAGNOSIS — N186 End stage renal disease: Secondary | ICD-10-CM | POA: Diagnosis not present

## 2022-08-30 DIAGNOSIS — D509 Iron deficiency anemia, unspecified: Secondary | ICD-10-CM | POA: Diagnosis not present

## 2022-09-01 DIAGNOSIS — N186 End stage renal disease: Secondary | ICD-10-CM | POA: Diagnosis not present

## 2022-09-01 DIAGNOSIS — D689 Coagulation defect, unspecified: Secondary | ICD-10-CM | POA: Diagnosis not present

## 2022-09-01 DIAGNOSIS — D696 Thrombocytopenia, unspecified: Secondary | ICD-10-CM | POA: Diagnosis not present

## 2022-09-01 DIAGNOSIS — Z992 Dependence on renal dialysis: Secondary | ICD-10-CM | POA: Diagnosis not present

## 2022-09-01 DIAGNOSIS — N2581 Secondary hyperparathyroidism of renal origin: Secondary | ICD-10-CM | POA: Diagnosis not present

## 2022-09-01 DIAGNOSIS — D509 Iron deficiency anemia, unspecified: Secondary | ICD-10-CM | POA: Diagnosis not present

## 2022-09-03 DIAGNOSIS — N2581 Secondary hyperparathyroidism of renal origin: Secondary | ICD-10-CM | POA: Diagnosis not present

## 2022-09-03 DIAGNOSIS — D696 Thrombocytopenia, unspecified: Secondary | ICD-10-CM | POA: Diagnosis not present

## 2022-09-03 DIAGNOSIS — D509 Iron deficiency anemia, unspecified: Secondary | ICD-10-CM | POA: Diagnosis not present

## 2022-09-03 DIAGNOSIS — D689 Coagulation defect, unspecified: Secondary | ICD-10-CM | POA: Diagnosis not present

## 2022-09-03 DIAGNOSIS — N186 End stage renal disease: Secondary | ICD-10-CM | POA: Diagnosis not present

## 2022-09-03 DIAGNOSIS — Z992 Dependence on renal dialysis: Secondary | ICD-10-CM | POA: Diagnosis not present

## 2022-09-06 DIAGNOSIS — N186 End stage renal disease: Secondary | ICD-10-CM | POA: Diagnosis not present

## 2022-09-06 DIAGNOSIS — N2581 Secondary hyperparathyroidism of renal origin: Secondary | ICD-10-CM | POA: Diagnosis not present

## 2022-09-06 DIAGNOSIS — D696 Thrombocytopenia, unspecified: Secondary | ICD-10-CM | POA: Diagnosis not present

## 2022-09-06 DIAGNOSIS — D509 Iron deficiency anemia, unspecified: Secondary | ICD-10-CM | POA: Diagnosis not present

## 2022-09-06 DIAGNOSIS — D689 Coagulation defect, unspecified: Secondary | ICD-10-CM | POA: Diagnosis not present

## 2022-09-06 DIAGNOSIS — Z992 Dependence on renal dialysis: Secondary | ICD-10-CM | POA: Diagnosis not present

## 2022-09-08 DIAGNOSIS — D689 Coagulation defect, unspecified: Secondary | ICD-10-CM | POA: Diagnosis not present

## 2022-09-08 DIAGNOSIS — N186 End stage renal disease: Secondary | ICD-10-CM | POA: Diagnosis not present

## 2022-09-08 DIAGNOSIS — N2581 Secondary hyperparathyroidism of renal origin: Secondary | ICD-10-CM | POA: Diagnosis not present

## 2022-09-08 DIAGNOSIS — D509 Iron deficiency anemia, unspecified: Secondary | ICD-10-CM | POA: Diagnosis not present

## 2022-09-08 DIAGNOSIS — D696 Thrombocytopenia, unspecified: Secondary | ICD-10-CM | POA: Diagnosis not present

## 2022-09-08 DIAGNOSIS — Z992 Dependence on renal dialysis: Secondary | ICD-10-CM | POA: Diagnosis not present

## 2022-09-10 DIAGNOSIS — D509 Iron deficiency anemia, unspecified: Secondary | ICD-10-CM | POA: Diagnosis not present

## 2022-09-10 DIAGNOSIS — D696 Thrombocytopenia, unspecified: Secondary | ICD-10-CM | POA: Diagnosis not present

## 2022-09-10 DIAGNOSIS — N186 End stage renal disease: Secondary | ICD-10-CM | POA: Diagnosis not present

## 2022-09-10 DIAGNOSIS — D689 Coagulation defect, unspecified: Secondary | ICD-10-CM | POA: Diagnosis not present

## 2022-09-10 DIAGNOSIS — Z992 Dependence on renal dialysis: Secondary | ICD-10-CM | POA: Diagnosis not present

## 2022-09-10 DIAGNOSIS — N2581 Secondary hyperparathyroidism of renal origin: Secondary | ICD-10-CM | POA: Diagnosis not present

## 2022-09-12 DIAGNOSIS — Z992 Dependence on renal dialysis: Secondary | ICD-10-CM | POA: Diagnosis not present

## 2022-09-12 DIAGNOSIS — N186 End stage renal disease: Secondary | ICD-10-CM | POA: Diagnosis not present

## 2022-09-13 DIAGNOSIS — K750 Abscess of liver: Secondary | ICD-10-CM | POA: Diagnosis not present

## 2022-09-13 DIAGNOSIS — D689 Coagulation defect, unspecified: Secondary | ICD-10-CM | POA: Diagnosis not present

## 2022-09-13 DIAGNOSIS — D509 Iron deficiency anemia, unspecified: Secondary | ICD-10-CM | POA: Diagnosis not present

## 2022-09-13 DIAGNOSIS — D696 Thrombocytopenia, unspecified: Secondary | ICD-10-CM | POA: Diagnosis not present

## 2022-09-13 DIAGNOSIS — Z992 Dependence on renal dialysis: Secondary | ICD-10-CM | POA: Diagnosis not present

## 2022-09-13 DIAGNOSIS — N186 End stage renal disease: Secondary | ICD-10-CM | POA: Diagnosis not present

## 2022-09-13 DIAGNOSIS — N2581 Secondary hyperparathyroidism of renal origin: Secondary | ICD-10-CM | POA: Diagnosis not present

## 2022-09-13 DIAGNOSIS — E119 Type 2 diabetes mellitus without complications: Secondary | ICD-10-CM | POA: Diagnosis not present

## 2022-09-15 DIAGNOSIS — N2581 Secondary hyperparathyroidism of renal origin: Secondary | ICD-10-CM | POA: Diagnosis not present

## 2022-09-15 DIAGNOSIS — E119 Type 2 diabetes mellitus without complications: Secondary | ICD-10-CM | POA: Diagnosis not present

## 2022-09-15 DIAGNOSIS — D696 Thrombocytopenia, unspecified: Secondary | ICD-10-CM | POA: Diagnosis not present

## 2022-09-15 DIAGNOSIS — N186 End stage renal disease: Secondary | ICD-10-CM | POA: Diagnosis not present

## 2022-09-15 DIAGNOSIS — K750 Abscess of liver: Secondary | ICD-10-CM | POA: Diagnosis not present

## 2022-09-15 DIAGNOSIS — D689 Coagulation defect, unspecified: Secondary | ICD-10-CM | POA: Diagnosis not present

## 2022-09-15 DIAGNOSIS — D509 Iron deficiency anemia, unspecified: Secondary | ICD-10-CM | POA: Diagnosis not present

## 2022-09-15 DIAGNOSIS — Z992 Dependence on renal dialysis: Secondary | ICD-10-CM | POA: Diagnosis not present

## 2022-09-17 DIAGNOSIS — D509 Iron deficiency anemia, unspecified: Secondary | ICD-10-CM | POA: Diagnosis not present

## 2022-09-17 DIAGNOSIS — K750 Abscess of liver: Secondary | ICD-10-CM | POA: Diagnosis not present

## 2022-09-17 DIAGNOSIS — D696 Thrombocytopenia, unspecified: Secondary | ICD-10-CM | POA: Diagnosis not present

## 2022-09-17 DIAGNOSIS — Z992 Dependence on renal dialysis: Secondary | ICD-10-CM | POA: Diagnosis not present

## 2022-09-17 DIAGNOSIS — E119 Type 2 diabetes mellitus without complications: Secondary | ICD-10-CM | POA: Diagnosis not present

## 2022-09-17 DIAGNOSIS — N186 End stage renal disease: Secondary | ICD-10-CM | POA: Diagnosis not present

## 2022-09-17 DIAGNOSIS — D689 Coagulation defect, unspecified: Secondary | ICD-10-CM | POA: Diagnosis not present

## 2022-09-17 DIAGNOSIS — N2581 Secondary hyperparathyroidism of renal origin: Secondary | ICD-10-CM | POA: Diagnosis not present

## 2022-09-20 DIAGNOSIS — D509 Iron deficiency anemia, unspecified: Secondary | ICD-10-CM | POA: Diagnosis not present

## 2022-09-20 DIAGNOSIS — K750 Abscess of liver: Secondary | ICD-10-CM | POA: Diagnosis not present

## 2022-09-20 DIAGNOSIS — E119 Type 2 diabetes mellitus without complications: Secondary | ICD-10-CM | POA: Diagnosis not present

## 2022-09-20 DIAGNOSIS — D689 Coagulation defect, unspecified: Secondary | ICD-10-CM | POA: Diagnosis not present

## 2022-09-20 DIAGNOSIS — N186 End stage renal disease: Secondary | ICD-10-CM | POA: Diagnosis not present

## 2022-09-20 DIAGNOSIS — Z992 Dependence on renal dialysis: Secondary | ICD-10-CM | POA: Diagnosis not present

## 2022-09-20 DIAGNOSIS — D696 Thrombocytopenia, unspecified: Secondary | ICD-10-CM | POA: Diagnosis not present

## 2022-09-20 DIAGNOSIS — N2581 Secondary hyperparathyroidism of renal origin: Secondary | ICD-10-CM | POA: Diagnosis not present

## 2022-09-22 DIAGNOSIS — N2581 Secondary hyperparathyroidism of renal origin: Secondary | ICD-10-CM | POA: Diagnosis not present

## 2022-09-22 DIAGNOSIS — D689 Coagulation defect, unspecified: Secondary | ICD-10-CM | POA: Diagnosis not present

## 2022-09-22 DIAGNOSIS — K750 Abscess of liver: Secondary | ICD-10-CM | POA: Diagnosis not present

## 2022-09-22 DIAGNOSIS — Z992 Dependence on renal dialysis: Secondary | ICD-10-CM | POA: Diagnosis not present

## 2022-09-22 DIAGNOSIS — N186 End stage renal disease: Secondary | ICD-10-CM | POA: Diagnosis not present

## 2022-09-22 DIAGNOSIS — E119 Type 2 diabetes mellitus without complications: Secondary | ICD-10-CM | POA: Diagnosis not present

## 2022-09-22 DIAGNOSIS — D696 Thrombocytopenia, unspecified: Secondary | ICD-10-CM | POA: Diagnosis not present

## 2022-09-22 DIAGNOSIS — D509 Iron deficiency anemia, unspecified: Secondary | ICD-10-CM | POA: Diagnosis not present

## 2022-09-24 DIAGNOSIS — N2581 Secondary hyperparathyroidism of renal origin: Secondary | ICD-10-CM | POA: Diagnosis not present

## 2022-09-24 DIAGNOSIS — Z992 Dependence on renal dialysis: Secondary | ICD-10-CM | POA: Diagnosis not present

## 2022-09-24 DIAGNOSIS — K750 Abscess of liver: Secondary | ICD-10-CM | POA: Diagnosis not present

## 2022-09-24 DIAGNOSIS — E119 Type 2 diabetes mellitus without complications: Secondary | ICD-10-CM | POA: Diagnosis not present

## 2022-09-24 DIAGNOSIS — D689 Coagulation defect, unspecified: Secondary | ICD-10-CM | POA: Diagnosis not present

## 2022-09-24 DIAGNOSIS — D696 Thrombocytopenia, unspecified: Secondary | ICD-10-CM | POA: Diagnosis not present

## 2022-09-24 DIAGNOSIS — N186 End stage renal disease: Secondary | ICD-10-CM | POA: Diagnosis not present

## 2022-09-24 DIAGNOSIS — D509 Iron deficiency anemia, unspecified: Secondary | ICD-10-CM | POA: Diagnosis not present

## 2022-09-27 DIAGNOSIS — E119 Type 2 diabetes mellitus without complications: Secondary | ICD-10-CM | POA: Diagnosis not present

## 2022-09-27 DIAGNOSIS — N2581 Secondary hyperparathyroidism of renal origin: Secondary | ICD-10-CM | POA: Diagnosis not present

## 2022-09-27 DIAGNOSIS — D689 Coagulation defect, unspecified: Secondary | ICD-10-CM | POA: Diagnosis not present

## 2022-09-27 DIAGNOSIS — Z992 Dependence on renal dialysis: Secondary | ICD-10-CM | POA: Diagnosis not present

## 2022-09-27 DIAGNOSIS — K750 Abscess of liver: Secondary | ICD-10-CM | POA: Diagnosis not present

## 2022-09-27 DIAGNOSIS — N186 End stage renal disease: Secondary | ICD-10-CM | POA: Diagnosis not present

## 2022-09-27 DIAGNOSIS — D509 Iron deficiency anemia, unspecified: Secondary | ICD-10-CM | POA: Diagnosis not present

## 2022-09-27 DIAGNOSIS — D696 Thrombocytopenia, unspecified: Secondary | ICD-10-CM | POA: Diagnosis not present

## 2022-09-29 DIAGNOSIS — E119 Type 2 diabetes mellitus without complications: Secondary | ICD-10-CM | POA: Diagnosis not present

## 2022-09-29 DIAGNOSIS — D696 Thrombocytopenia, unspecified: Secondary | ICD-10-CM | POA: Diagnosis not present

## 2022-09-29 DIAGNOSIS — N186 End stage renal disease: Secondary | ICD-10-CM | POA: Diagnosis not present

## 2022-09-29 DIAGNOSIS — Z992 Dependence on renal dialysis: Secondary | ICD-10-CM | POA: Diagnosis not present

## 2022-09-29 DIAGNOSIS — D509 Iron deficiency anemia, unspecified: Secondary | ICD-10-CM | POA: Diagnosis not present

## 2022-09-29 DIAGNOSIS — N2581 Secondary hyperparathyroidism of renal origin: Secondary | ICD-10-CM | POA: Diagnosis not present

## 2022-09-29 DIAGNOSIS — D689 Coagulation defect, unspecified: Secondary | ICD-10-CM | POA: Diagnosis not present

## 2022-09-29 DIAGNOSIS — K750 Abscess of liver: Secondary | ICD-10-CM | POA: Diagnosis not present

## 2022-10-01 DIAGNOSIS — D509 Iron deficiency anemia, unspecified: Secondary | ICD-10-CM | POA: Diagnosis not present

## 2022-10-01 DIAGNOSIS — E119 Type 2 diabetes mellitus without complications: Secondary | ICD-10-CM | POA: Diagnosis not present

## 2022-10-01 DIAGNOSIS — N186 End stage renal disease: Secondary | ICD-10-CM | POA: Diagnosis not present

## 2022-10-01 DIAGNOSIS — D689 Coagulation defect, unspecified: Secondary | ICD-10-CM | POA: Diagnosis not present

## 2022-10-01 DIAGNOSIS — N2581 Secondary hyperparathyroidism of renal origin: Secondary | ICD-10-CM | POA: Diagnosis not present

## 2022-10-01 DIAGNOSIS — K750 Abscess of liver: Secondary | ICD-10-CM | POA: Diagnosis not present

## 2022-10-01 DIAGNOSIS — Z992 Dependence on renal dialysis: Secondary | ICD-10-CM | POA: Diagnosis not present

## 2022-10-01 DIAGNOSIS — D696 Thrombocytopenia, unspecified: Secondary | ICD-10-CM | POA: Diagnosis not present

## 2022-10-04 DIAGNOSIS — K750 Abscess of liver: Secondary | ICD-10-CM | POA: Diagnosis not present

## 2022-10-04 DIAGNOSIS — D509 Iron deficiency anemia, unspecified: Secondary | ICD-10-CM | POA: Diagnosis not present

## 2022-10-04 DIAGNOSIS — Z992 Dependence on renal dialysis: Secondary | ICD-10-CM | POA: Diagnosis not present

## 2022-10-04 DIAGNOSIS — N186 End stage renal disease: Secondary | ICD-10-CM | POA: Diagnosis not present

## 2022-10-04 DIAGNOSIS — E119 Type 2 diabetes mellitus without complications: Secondary | ICD-10-CM | POA: Diagnosis not present

## 2022-10-04 DIAGNOSIS — D689 Coagulation defect, unspecified: Secondary | ICD-10-CM | POA: Diagnosis not present

## 2022-10-04 DIAGNOSIS — N2581 Secondary hyperparathyroidism of renal origin: Secondary | ICD-10-CM | POA: Diagnosis not present

## 2022-10-04 DIAGNOSIS — D696 Thrombocytopenia, unspecified: Secondary | ICD-10-CM | POA: Diagnosis not present

## 2022-10-06 DIAGNOSIS — N2581 Secondary hyperparathyroidism of renal origin: Secondary | ICD-10-CM | POA: Diagnosis not present

## 2022-10-06 DIAGNOSIS — N186 End stage renal disease: Secondary | ICD-10-CM | POA: Diagnosis not present

## 2022-10-06 DIAGNOSIS — K750 Abscess of liver: Secondary | ICD-10-CM | POA: Diagnosis not present

## 2022-10-06 DIAGNOSIS — D509 Iron deficiency anemia, unspecified: Secondary | ICD-10-CM | POA: Diagnosis not present

## 2022-10-06 DIAGNOSIS — D689 Coagulation defect, unspecified: Secondary | ICD-10-CM | POA: Diagnosis not present

## 2022-10-06 DIAGNOSIS — E119 Type 2 diabetes mellitus without complications: Secondary | ICD-10-CM | POA: Diagnosis not present

## 2022-10-06 DIAGNOSIS — Z992 Dependence on renal dialysis: Secondary | ICD-10-CM | POA: Diagnosis not present

## 2022-10-06 DIAGNOSIS — D696 Thrombocytopenia, unspecified: Secondary | ICD-10-CM | POA: Diagnosis not present

## 2022-10-08 DIAGNOSIS — D696 Thrombocytopenia, unspecified: Secondary | ICD-10-CM | POA: Diagnosis not present

## 2022-10-08 DIAGNOSIS — N2581 Secondary hyperparathyroidism of renal origin: Secondary | ICD-10-CM | POA: Diagnosis not present

## 2022-10-08 DIAGNOSIS — D509 Iron deficiency anemia, unspecified: Secondary | ICD-10-CM | POA: Diagnosis not present

## 2022-10-08 DIAGNOSIS — E119 Type 2 diabetes mellitus without complications: Secondary | ICD-10-CM | POA: Diagnosis not present

## 2022-10-08 DIAGNOSIS — Z992 Dependence on renal dialysis: Secondary | ICD-10-CM | POA: Diagnosis not present

## 2022-10-08 DIAGNOSIS — D689 Coagulation defect, unspecified: Secondary | ICD-10-CM | POA: Diagnosis not present

## 2022-10-08 DIAGNOSIS — K750 Abscess of liver: Secondary | ICD-10-CM | POA: Diagnosis not present

## 2022-10-08 DIAGNOSIS — N186 End stage renal disease: Secondary | ICD-10-CM | POA: Diagnosis not present

## 2022-10-11 DIAGNOSIS — D689 Coagulation defect, unspecified: Secondary | ICD-10-CM | POA: Diagnosis not present

## 2022-10-11 DIAGNOSIS — Z992 Dependence on renal dialysis: Secondary | ICD-10-CM | POA: Diagnosis not present

## 2022-10-11 DIAGNOSIS — N2581 Secondary hyperparathyroidism of renal origin: Secondary | ICD-10-CM | POA: Diagnosis not present

## 2022-10-11 DIAGNOSIS — E119 Type 2 diabetes mellitus without complications: Secondary | ICD-10-CM | POA: Diagnosis not present

## 2022-10-11 DIAGNOSIS — K750 Abscess of liver: Secondary | ICD-10-CM | POA: Diagnosis not present

## 2022-10-11 DIAGNOSIS — D509 Iron deficiency anemia, unspecified: Secondary | ICD-10-CM | POA: Diagnosis not present

## 2022-10-11 DIAGNOSIS — D696 Thrombocytopenia, unspecified: Secondary | ICD-10-CM | POA: Diagnosis not present

## 2022-10-11 DIAGNOSIS — N186 End stage renal disease: Secondary | ICD-10-CM | POA: Diagnosis not present

## 2022-10-12 DIAGNOSIS — N186 End stage renal disease: Secondary | ICD-10-CM | POA: Diagnosis not present

## 2022-10-12 DIAGNOSIS — Z992 Dependence on renal dialysis: Secondary | ICD-10-CM | POA: Diagnosis not present

## 2022-10-13 DIAGNOSIS — D689 Coagulation defect, unspecified: Secondary | ICD-10-CM | POA: Diagnosis not present

## 2022-10-13 DIAGNOSIS — N2581 Secondary hyperparathyroidism of renal origin: Secondary | ICD-10-CM | POA: Diagnosis not present

## 2022-10-13 DIAGNOSIS — D509 Iron deficiency anemia, unspecified: Secondary | ICD-10-CM | POA: Diagnosis not present

## 2022-10-13 DIAGNOSIS — N186 End stage renal disease: Secondary | ICD-10-CM | POA: Diagnosis not present

## 2022-10-13 DIAGNOSIS — Z992 Dependence on renal dialysis: Secondary | ICD-10-CM | POA: Diagnosis not present

## 2022-10-13 DIAGNOSIS — D696 Thrombocytopenia, unspecified: Secondary | ICD-10-CM | POA: Diagnosis not present

## 2022-10-15 DIAGNOSIS — D509 Iron deficiency anemia, unspecified: Secondary | ICD-10-CM | POA: Diagnosis not present

## 2022-10-15 DIAGNOSIS — Z992 Dependence on renal dialysis: Secondary | ICD-10-CM | POA: Diagnosis not present

## 2022-10-15 DIAGNOSIS — N2581 Secondary hyperparathyroidism of renal origin: Secondary | ICD-10-CM | POA: Diagnosis not present

## 2022-10-15 DIAGNOSIS — N186 End stage renal disease: Secondary | ICD-10-CM | POA: Diagnosis not present

## 2022-10-15 DIAGNOSIS — D696 Thrombocytopenia, unspecified: Secondary | ICD-10-CM | POA: Diagnosis not present

## 2022-10-15 DIAGNOSIS — D689 Coagulation defect, unspecified: Secondary | ICD-10-CM | POA: Diagnosis not present

## 2022-10-18 DIAGNOSIS — N2581 Secondary hyperparathyroidism of renal origin: Secondary | ICD-10-CM | POA: Diagnosis not present

## 2022-10-18 DIAGNOSIS — D696 Thrombocytopenia, unspecified: Secondary | ICD-10-CM | POA: Diagnosis not present

## 2022-10-18 DIAGNOSIS — Z992 Dependence on renal dialysis: Secondary | ICD-10-CM | POA: Diagnosis not present

## 2022-10-18 DIAGNOSIS — N186 End stage renal disease: Secondary | ICD-10-CM | POA: Diagnosis not present

## 2022-10-18 DIAGNOSIS — D689 Coagulation defect, unspecified: Secondary | ICD-10-CM | POA: Diagnosis not present

## 2022-10-18 DIAGNOSIS — D509 Iron deficiency anemia, unspecified: Secondary | ICD-10-CM | POA: Diagnosis not present

## 2022-10-20 DIAGNOSIS — D509 Iron deficiency anemia, unspecified: Secondary | ICD-10-CM | POA: Diagnosis not present

## 2022-10-20 DIAGNOSIS — N2581 Secondary hyperparathyroidism of renal origin: Secondary | ICD-10-CM | POA: Diagnosis not present

## 2022-10-20 DIAGNOSIS — D696 Thrombocytopenia, unspecified: Secondary | ICD-10-CM | POA: Diagnosis not present

## 2022-10-20 DIAGNOSIS — N186 End stage renal disease: Secondary | ICD-10-CM | POA: Diagnosis not present

## 2022-10-20 DIAGNOSIS — Z992 Dependence on renal dialysis: Secondary | ICD-10-CM | POA: Diagnosis not present

## 2022-10-20 DIAGNOSIS — D689 Coagulation defect, unspecified: Secondary | ICD-10-CM | POA: Diagnosis not present

## 2022-10-21 ENCOUNTER — Telehealth: Payer: Self-pay | Admitting: Podiatry

## 2022-10-21 ENCOUNTER — Ambulatory Visit: Payer: Medicare Other | Admitting: Podiatry

## 2022-10-21 DIAGNOSIS — I150 Renovascular hypertension: Secondary | ICD-10-CM | POA: Diagnosis not present

## 2022-10-21 DIAGNOSIS — E782 Mixed hyperlipidemia: Secondary | ICD-10-CM | POA: Diagnosis not present

## 2022-10-21 DIAGNOSIS — N186 End stage renal disease: Secondary | ICD-10-CM | POA: Diagnosis not present

## 2022-10-21 DIAGNOSIS — I2510 Atherosclerotic heart disease of native coronary artery without angina pectoris: Secondary | ICD-10-CM | POA: Diagnosis not present

## 2022-10-21 DIAGNOSIS — Z951 Presence of aortocoronary bypass graft: Secondary | ICD-10-CM | POA: Diagnosis not present

## 2022-10-21 NOTE — Telephone Encounter (Signed)
Per voicemail from 803am today from [place] appt needs to be canceled as he has an emergency appt scheduled last night with the heart doctor. She will call to r/s  Appt has been cxled

## 2022-10-22 DIAGNOSIS — N186 End stage renal disease: Secondary | ICD-10-CM | POA: Diagnosis not present

## 2022-10-22 DIAGNOSIS — D509 Iron deficiency anemia, unspecified: Secondary | ICD-10-CM | POA: Diagnosis not present

## 2022-10-22 DIAGNOSIS — N2581 Secondary hyperparathyroidism of renal origin: Secondary | ICD-10-CM | POA: Diagnosis not present

## 2022-10-22 DIAGNOSIS — D689 Coagulation defect, unspecified: Secondary | ICD-10-CM | POA: Diagnosis not present

## 2022-10-22 DIAGNOSIS — Z992 Dependence on renal dialysis: Secondary | ICD-10-CM | POA: Diagnosis not present

## 2022-10-22 DIAGNOSIS — D696 Thrombocytopenia, unspecified: Secondary | ICD-10-CM | POA: Diagnosis not present

## 2022-10-25 DIAGNOSIS — N186 End stage renal disease: Secondary | ICD-10-CM | POA: Diagnosis not present

## 2022-10-25 DIAGNOSIS — N2581 Secondary hyperparathyroidism of renal origin: Secondary | ICD-10-CM | POA: Diagnosis not present

## 2022-10-25 DIAGNOSIS — Z992 Dependence on renal dialysis: Secondary | ICD-10-CM | POA: Diagnosis not present

## 2022-10-25 DIAGNOSIS — D509 Iron deficiency anemia, unspecified: Secondary | ICD-10-CM | POA: Diagnosis not present

## 2022-10-25 DIAGNOSIS — D696 Thrombocytopenia, unspecified: Secondary | ICD-10-CM | POA: Diagnosis not present

## 2022-10-25 DIAGNOSIS — D689 Coagulation defect, unspecified: Secondary | ICD-10-CM | POA: Diagnosis not present

## 2022-10-27 DIAGNOSIS — N2581 Secondary hyperparathyroidism of renal origin: Secondary | ICD-10-CM | POA: Diagnosis not present

## 2022-10-27 DIAGNOSIS — N186 End stage renal disease: Secondary | ICD-10-CM | POA: Diagnosis not present

## 2022-10-27 DIAGNOSIS — Z992 Dependence on renal dialysis: Secondary | ICD-10-CM | POA: Diagnosis not present

## 2022-10-27 DIAGNOSIS — D696 Thrombocytopenia, unspecified: Secondary | ICD-10-CM | POA: Diagnosis not present

## 2022-10-27 DIAGNOSIS — D509 Iron deficiency anemia, unspecified: Secondary | ICD-10-CM | POA: Diagnosis not present

## 2022-10-27 DIAGNOSIS — D689 Coagulation defect, unspecified: Secondary | ICD-10-CM | POA: Diagnosis not present

## 2022-10-29 DIAGNOSIS — D509 Iron deficiency anemia, unspecified: Secondary | ICD-10-CM | POA: Diagnosis not present

## 2022-10-29 DIAGNOSIS — N2581 Secondary hyperparathyroidism of renal origin: Secondary | ICD-10-CM | POA: Diagnosis not present

## 2022-10-29 DIAGNOSIS — D689 Coagulation defect, unspecified: Secondary | ICD-10-CM | POA: Diagnosis not present

## 2022-10-29 DIAGNOSIS — Z992 Dependence on renal dialysis: Secondary | ICD-10-CM | POA: Diagnosis not present

## 2022-10-29 DIAGNOSIS — N186 End stage renal disease: Secondary | ICD-10-CM | POA: Diagnosis not present

## 2022-10-29 DIAGNOSIS — D696 Thrombocytopenia, unspecified: Secondary | ICD-10-CM | POA: Diagnosis not present

## 2022-11-01 DIAGNOSIS — N186 End stage renal disease: Secondary | ICD-10-CM | POA: Diagnosis not present

## 2022-11-01 DIAGNOSIS — N2581 Secondary hyperparathyroidism of renal origin: Secondary | ICD-10-CM | POA: Diagnosis not present

## 2022-11-01 DIAGNOSIS — D689 Coagulation defect, unspecified: Secondary | ICD-10-CM | POA: Diagnosis not present

## 2022-11-01 DIAGNOSIS — Z992 Dependence on renal dialysis: Secondary | ICD-10-CM | POA: Diagnosis not present

## 2022-11-01 DIAGNOSIS — D696 Thrombocytopenia, unspecified: Secondary | ICD-10-CM | POA: Diagnosis not present

## 2022-11-01 DIAGNOSIS — D509 Iron deficiency anemia, unspecified: Secondary | ICD-10-CM | POA: Diagnosis not present

## 2022-11-03 DIAGNOSIS — Z992 Dependence on renal dialysis: Secondary | ICD-10-CM | POA: Diagnosis not present

## 2022-11-03 DIAGNOSIS — D509 Iron deficiency anemia, unspecified: Secondary | ICD-10-CM | POA: Diagnosis not present

## 2022-11-03 DIAGNOSIS — D689 Coagulation defect, unspecified: Secondary | ICD-10-CM | POA: Diagnosis not present

## 2022-11-03 DIAGNOSIS — N2581 Secondary hyperparathyroidism of renal origin: Secondary | ICD-10-CM | POA: Diagnosis not present

## 2022-11-03 DIAGNOSIS — N186 End stage renal disease: Secondary | ICD-10-CM | POA: Diagnosis not present

## 2022-11-03 DIAGNOSIS — D696 Thrombocytopenia, unspecified: Secondary | ICD-10-CM | POA: Diagnosis not present

## 2022-11-05 DIAGNOSIS — N186 End stage renal disease: Secondary | ICD-10-CM | POA: Diagnosis not present

## 2022-11-05 DIAGNOSIS — D696 Thrombocytopenia, unspecified: Secondary | ICD-10-CM | POA: Diagnosis not present

## 2022-11-05 DIAGNOSIS — N2581 Secondary hyperparathyroidism of renal origin: Secondary | ICD-10-CM | POA: Diagnosis not present

## 2022-11-05 DIAGNOSIS — D689 Coagulation defect, unspecified: Secondary | ICD-10-CM | POA: Diagnosis not present

## 2022-11-05 DIAGNOSIS — D509 Iron deficiency anemia, unspecified: Secondary | ICD-10-CM | POA: Diagnosis not present

## 2022-11-05 DIAGNOSIS — Z992 Dependence on renal dialysis: Secondary | ICD-10-CM | POA: Diagnosis not present

## 2022-11-08 DIAGNOSIS — Z992 Dependence on renal dialysis: Secondary | ICD-10-CM | POA: Diagnosis not present

## 2022-11-08 DIAGNOSIS — N186 End stage renal disease: Secondary | ICD-10-CM | POA: Diagnosis not present

## 2022-11-08 DIAGNOSIS — D509 Iron deficiency anemia, unspecified: Secondary | ICD-10-CM | POA: Diagnosis not present

## 2022-11-08 DIAGNOSIS — N2581 Secondary hyperparathyroidism of renal origin: Secondary | ICD-10-CM | POA: Diagnosis not present

## 2022-11-08 DIAGNOSIS — D689 Coagulation defect, unspecified: Secondary | ICD-10-CM | POA: Diagnosis not present

## 2022-11-08 DIAGNOSIS — D696 Thrombocytopenia, unspecified: Secondary | ICD-10-CM | POA: Diagnosis not present

## 2022-11-10 DIAGNOSIS — D689 Coagulation defect, unspecified: Secondary | ICD-10-CM | POA: Diagnosis not present

## 2022-11-10 DIAGNOSIS — N186 End stage renal disease: Secondary | ICD-10-CM | POA: Diagnosis not present

## 2022-11-10 DIAGNOSIS — D696 Thrombocytopenia, unspecified: Secondary | ICD-10-CM | POA: Diagnosis not present

## 2022-11-10 DIAGNOSIS — Z992 Dependence on renal dialysis: Secondary | ICD-10-CM | POA: Diagnosis not present

## 2022-11-10 DIAGNOSIS — D509 Iron deficiency anemia, unspecified: Secondary | ICD-10-CM | POA: Diagnosis not present

## 2022-11-10 DIAGNOSIS — N2581 Secondary hyperparathyroidism of renal origin: Secondary | ICD-10-CM | POA: Diagnosis not present

## 2022-11-12 DIAGNOSIS — D509 Iron deficiency anemia, unspecified: Secondary | ICD-10-CM | POA: Diagnosis not present

## 2022-11-12 DIAGNOSIS — D696 Thrombocytopenia, unspecified: Secondary | ICD-10-CM | POA: Diagnosis not present

## 2022-11-12 DIAGNOSIS — N2581 Secondary hyperparathyroidism of renal origin: Secondary | ICD-10-CM | POA: Diagnosis not present

## 2022-11-12 DIAGNOSIS — D689 Coagulation defect, unspecified: Secondary | ICD-10-CM | POA: Diagnosis not present

## 2022-11-12 DIAGNOSIS — Z992 Dependence on renal dialysis: Secondary | ICD-10-CM | POA: Diagnosis not present

## 2022-11-12 DIAGNOSIS — N186 End stage renal disease: Secondary | ICD-10-CM | POA: Diagnosis not present

## 2022-11-15 DIAGNOSIS — D696 Thrombocytopenia, unspecified: Secondary | ICD-10-CM | POA: Diagnosis not present

## 2022-11-15 DIAGNOSIS — Z992 Dependence on renal dialysis: Secondary | ICD-10-CM | POA: Diagnosis not present

## 2022-11-15 DIAGNOSIS — N2581 Secondary hyperparathyroidism of renal origin: Secondary | ICD-10-CM | POA: Diagnosis not present

## 2022-11-15 DIAGNOSIS — N186 End stage renal disease: Secondary | ICD-10-CM | POA: Diagnosis not present

## 2022-11-15 DIAGNOSIS — D509 Iron deficiency anemia, unspecified: Secondary | ICD-10-CM | POA: Diagnosis not present

## 2022-11-15 DIAGNOSIS — L299 Pruritus, unspecified: Secondary | ICD-10-CM | POA: Diagnosis not present

## 2022-11-15 DIAGNOSIS — D689 Coagulation defect, unspecified: Secondary | ICD-10-CM | POA: Diagnosis not present

## 2022-11-17 DIAGNOSIS — L299 Pruritus, unspecified: Secondary | ICD-10-CM | POA: Diagnosis not present

## 2022-11-17 DIAGNOSIS — D509 Iron deficiency anemia, unspecified: Secondary | ICD-10-CM | POA: Diagnosis not present

## 2022-11-17 DIAGNOSIS — D689 Coagulation defect, unspecified: Secondary | ICD-10-CM | POA: Diagnosis not present

## 2022-11-17 DIAGNOSIS — N186 End stage renal disease: Secondary | ICD-10-CM | POA: Diagnosis not present

## 2022-11-17 DIAGNOSIS — Z992 Dependence on renal dialysis: Secondary | ICD-10-CM | POA: Diagnosis not present

## 2022-11-17 DIAGNOSIS — N2581 Secondary hyperparathyroidism of renal origin: Secondary | ICD-10-CM | POA: Diagnosis not present

## 2022-11-17 DIAGNOSIS — D696 Thrombocytopenia, unspecified: Secondary | ICD-10-CM | POA: Diagnosis not present

## 2022-11-19 DIAGNOSIS — N2581 Secondary hyperparathyroidism of renal origin: Secondary | ICD-10-CM | POA: Diagnosis not present

## 2022-11-19 DIAGNOSIS — N186 End stage renal disease: Secondary | ICD-10-CM | POA: Diagnosis not present

## 2022-11-19 DIAGNOSIS — L299 Pruritus, unspecified: Secondary | ICD-10-CM | POA: Diagnosis not present

## 2022-11-19 DIAGNOSIS — Z992 Dependence on renal dialysis: Secondary | ICD-10-CM | POA: Diagnosis not present

## 2022-11-19 DIAGNOSIS — D509 Iron deficiency anemia, unspecified: Secondary | ICD-10-CM | POA: Diagnosis not present

## 2022-11-19 DIAGNOSIS — D696 Thrombocytopenia, unspecified: Secondary | ICD-10-CM | POA: Diagnosis not present

## 2022-11-19 DIAGNOSIS — D689 Coagulation defect, unspecified: Secondary | ICD-10-CM | POA: Diagnosis not present

## 2022-11-22 DIAGNOSIS — N2581 Secondary hyperparathyroidism of renal origin: Secondary | ICD-10-CM | POA: Diagnosis not present

## 2022-11-22 DIAGNOSIS — Z992 Dependence on renal dialysis: Secondary | ICD-10-CM | POA: Diagnosis not present

## 2022-11-22 DIAGNOSIS — N186 End stage renal disease: Secondary | ICD-10-CM | POA: Diagnosis not present

## 2022-11-22 DIAGNOSIS — D689 Coagulation defect, unspecified: Secondary | ICD-10-CM | POA: Diagnosis not present

## 2022-11-22 DIAGNOSIS — D509 Iron deficiency anemia, unspecified: Secondary | ICD-10-CM | POA: Diagnosis not present

## 2022-11-22 DIAGNOSIS — L299 Pruritus, unspecified: Secondary | ICD-10-CM | POA: Diagnosis not present

## 2022-11-22 DIAGNOSIS — D696 Thrombocytopenia, unspecified: Secondary | ICD-10-CM | POA: Diagnosis not present

## 2022-11-24 DIAGNOSIS — D696 Thrombocytopenia, unspecified: Secondary | ICD-10-CM | POA: Diagnosis not present

## 2022-11-24 DIAGNOSIS — D689 Coagulation defect, unspecified: Secondary | ICD-10-CM | POA: Diagnosis not present

## 2022-11-24 DIAGNOSIS — N2581 Secondary hyperparathyroidism of renal origin: Secondary | ICD-10-CM | POA: Diagnosis not present

## 2022-11-24 DIAGNOSIS — N186 End stage renal disease: Secondary | ICD-10-CM | POA: Diagnosis not present

## 2022-11-24 DIAGNOSIS — D509 Iron deficiency anemia, unspecified: Secondary | ICD-10-CM | POA: Diagnosis not present

## 2022-11-24 DIAGNOSIS — L299 Pruritus, unspecified: Secondary | ICD-10-CM | POA: Diagnosis not present

## 2022-11-24 DIAGNOSIS — Z992 Dependence on renal dialysis: Secondary | ICD-10-CM | POA: Diagnosis not present

## 2022-11-26 DIAGNOSIS — N186 End stage renal disease: Secondary | ICD-10-CM | POA: Diagnosis not present

## 2022-11-26 DIAGNOSIS — D509 Iron deficiency anemia, unspecified: Secondary | ICD-10-CM | POA: Diagnosis not present

## 2022-11-26 DIAGNOSIS — L299 Pruritus, unspecified: Secondary | ICD-10-CM | POA: Diagnosis not present

## 2022-11-26 DIAGNOSIS — N2581 Secondary hyperparathyroidism of renal origin: Secondary | ICD-10-CM | POA: Diagnosis not present

## 2022-11-26 DIAGNOSIS — D689 Coagulation defect, unspecified: Secondary | ICD-10-CM | POA: Diagnosis not present

## 2022-11-26 DIAGNOSIS — D696 Thrombocytopenia, unspecified: Secondary | ICD-10-CM | POA: Diagnosis not present

## 2022-11-26 DIAGNOSIS — Z992 Dependence on renal dialysis: Secondary | ICD-10-CM | POA: Diagnosis not present

## 2022-11-29 DIAGNOSIS — N186 End stage renal disease: Secondary | ICD-10-CM | POA: Diagnosis not present

## 2022-11-29 DIAGNOSIS — Z992 Dependence on renal dialysis: Secondary | ICD-10-CM | POA: Diagnosis not present

## 2022-11-29 DIAGNOSIS — D696 Thrombocytopenia, unspecified: Secondary | ICD-10-CM | POA: Diagnosis not present

## 2022-11-29 DIAGNOSIS — L299 Pruritus, unspecified: Secondary | ICD-10-CM | POA: Diagnosis not present

## 2022-11-29 DIAGNOSIS — D509 Iron deficiency anemia, unspecified: Secondary | ICD-10-CM | POA: Diagnosis not present

## 2022-11-29 DIAGNOSIS — N2581 Secondary hyperparathyroidism of renal origin: Secondary | ICD-10-CM | POA: Diagnosis not present

## 2022-11-29 DIAGNOSIS — D689 Coagulation defect, unspecified: Secondary | ICD-10-CM | POA: Diagnosis not present

## 2022-12-01 DIAGNOSIS — N186 End stage renal disease: Secondary | ICD-10-CM | POA: Diagnosis not present

## 2022-12-01 DIAGNOSIS — Z992 Dependence on renal dialysis: Secondary | ICD-10-CM | POA: Diagnosis not present

## 2022-12-01 DIAGNOSIS — D509 Iron deficiency anemia, unspecified: Secondary | ICD-10-CM | POA: Diagnosis not present

## 2022-12-01 DIAGNOSIS — D689 Coagulation defect, unspecified: Secondary | ICD-10-CM | POA: Diagnosis not present

## 2022-12-01 DIAGNOSIS — N2581 Secondary hyperparathyroidism of renal origin: Secondary | ICD-10-CM | POA: Diagnosis not present

## 2022-12-01 DIAGNOSIS — D696 Thrombocytopenia, unspecified: Secondary | ICD-10-CM | POA: Diagnosis not present

## 2022-12-01 DIAGNOSIS — L299 Pruritus, unspecified: Secondary | ICD-10-CM | POA: Diagnosis not present

## 2022-12-03 DIAGNOSIS — D696 Thrombocytopenia, unspecified: Secondary | ICD-10-CM | POA: Diagnosis not present

## 2022-12-03 DIAGNOSIS — L299 Pruritus, unspecified: Secondary | ICD-10-CM | POA: Diagnosis not present

## 2022-12-03 DIAGNOSIS — D509 Iron deficiency anemia, unspecified: Secondary | ICD-10-CM | POA: Diagnosis not present

## 2022-12-03 DIAGNOSIS — N186 End stage renal disease: Secondary | ICD-10-CM | POA: Diagnosis not present

## 2022-12-03 DIAGNOSIS — Z992 Dependence on renal dialysis: Secondary | ICD-10-CM | POA: Diagnosis not present

## 2022-12-03 DIAGNOSIS — N2581 Secondary hyperparathyroidism of renal origin: Secondary | ICD-10-CM | POA: Diagnosis not present

## 2022-12-03 DIAGNOSIS — D689 Coagulation defect, unspecified: Secondary | ICD-10-CM | POA: Diagnosis not present

## 2022-12-06 DIAGNOSIS — D509 Iron deficiency anemia, unspecified: Secondary | ICD-10-CM | POA: Diagnosis not present

## 2022-12-06 DIAGNOSIS — N2581 Secondary hyperparathyroidism of renal origin: Secondary | ICD-10-CM | POA: Diagnosis not present

## 2022-12-06 DIAGNOSIS — D689 Coagulation defect, unspecified: Secondary | ICD-10-CM | POA: Diagnosis not present

## 2022-12-06 DIAGNOSIS — N186 End stage renal disease: Secondary | ICD-10-CM | POA: Diagnosis not present

## 2022-12-06 DIAGNOSIS — Z992 Dependence on renal dialysis: Secondary | ICD-10-CM | POA: Diagnosis not present

## 2022-12-06 DIAGNOSIS — L299 Pruritus, unspecified: Secondary | ICD-10-CM | POA: Diagnosis not present

## 2022-12-06 DIAGNOSIS — D696 Thrombocytopenia, unspecified: Secondary | ICD-10-CM | POA: Diagnosis not present

## 2022-12-08 DIAGNOSIS — D689 Coagulation defect, unspecified: Secondary | ICD-10-CM | POA: Diagnosis not present

## 2022-12-08 DIAGNOSIS — D696 Thrombocytopenia, unspecified: Secondary | ICD-10-CM | POA: Diagnosis not present

## 2022-12-08 DIAGNOSIS — N186 End stage renal disease: Secondary | ICD-10-CM | POA: Diagnosis not present

## 2022-12-08 DIAGNOSIS — L299 Pruritus, unspecified: Secondary | ICD-10-CM | POA: Diagnosis not present

## 2022-12-08 DIAGNOSIS — N2581 Secondary hyperparathyroidism of renal origin: Secondary | ICD-10-CM | POA: Diagnosis not present

## 2022-12-08 DIAGNOSIS — D509 Iron deficiency anemia, unspecified: Secondary | ICD-10-CM | POA: Diagnosis not present

## 2022-12-08 DIAGNOSIS — Z992 Dependence on renal dialysis: Secondary | ICD-10-CM | POA: Diagnosis not present

## 2022-12-10 DIAGNOSIS — N186 End stage renal disease: Secondary | ICD-10-CM | POA: Diagnosis not present

## 2022-12-10 DIAGNOSIS — N2581 Secondary hyperparathyroidism of renal origin: Secondary | ICD-10-CM | POA: Diagnosis not present

## 2022-12-10 DIAGNOSIS — Z992 Dependence on renal dialysis: Secondary | ICD-10-CM | POA: Diagnosis not present

## 2022-12-10 DIAGNOSIS — D509 Iron deficiency anemia, unspecified: Secondary | ICD-10-CM | POA: Diagnosis not present

## 2022-12-10 DIAGNOSIS — L299 Pruritus, unspecified: Secondary | ICD-10-CM | POA: Diagnosis not present

## 2022-12-10 DIAGNOSIS — D689 Coagulation defect, unspecified: Secondary | ICD-10-CM | POA: Diagnosis not present

## 2022-12-10 DIAGNOSIS — D696 Thrombocytopenia, unspecified: Secondary | ICD-10-CM | POA: Diagnosis not present

## 2022-12-12 DIAGNOSIS — Z992 Dependence on renal dialysis: Secondary | ICD-10-CM | POA: Diagnosis not present

## 2022-12-12 DIAGNOSIS — N186 End stage renal disease: Secondary | ICD-10-CM | POA: Diagnosis not present

## 2022-12-13 DIAGNOSIS — L299 Pruritus, unspecified: Secondary | ICD-10-CM | POA: Diagnosis not present

## 2022-12-13 DIAGNOSIS — Z992 Dependence on renal dialysis: Secondary | ICD-10-CM | POA: Diagnosis not present

## 2022-12-13 DIAGNOSIS — N186 End stage renal disease: Secondary | ICD-10-CM | POA: Diagnosis not present

## 2022-12-13 DIAGNOSIS — D696 Thrombocytopenia, unspecified: Secondary | ICD-10-CM | POA: Diagnosis not present

## 2022-12-13 DIAGNOSIS — D509 Iron deficiency anemia, unspecified: Secondary | ICD-10-CM | POA: Diagnosis not present

## 2022-12-13 DIAGNOSIS — N2581 Secondary hyperparathyroidism of renal origin: Secondary | ICD-10-CM | POA: Diagnosis not present

## 2022-12-13 DIAGNOSIS — E875 Hyperkalemia: Secondary | ICD-10-CM | POA: Diagnosis not present

## 2022-12-13 DIAGNOSIS — E119 Type 2 diabetes mellitus without complications: Secondary | ICD-10-CM | POA: Diagnosis not present

## 2022-12-13 DIAGNOSIS — D689 Coagulation defect, unspecified: Secondary | ICD-10-CM | POA: Diagnosis not present

## 2022-12-15 DIAGNOSIS — D696 Thrombocytopenia, unspecified: Secondary | ICD-10-CM | POA: Diagnosis not present

## 2022-12-15 DIAGNOSIS — E119 Type 2 diabetes mellitus without complications: Secondary | ICD-10-CM | POA: Diagnosis not present

## 2022-12-15 DIAGNOSIS — D509 Iron deficiency anemia, unspecified: Secondary | ICD-10-CM | POA: Diagnosis not present

## 2022-12-15 DIAGNOSIS — N2581 Secondary hyperparathyroidism of renal origin: Secondary | ICD-10-CM | POA: Diagnosis not present

## 2022-12-15 DIAGNOSIS — L299 Pruritus, unspecified: Secondary | ICD-10-CM | POA: Diagnosis not present

## 2022-12-15 DIAGNOSIS — Z992 Dependence on renal dialysis: Secondary | ICD-10-CM | POA: Diagnosis not present

## 2022-12-15 DIAGNOSIS — D689 Coagulation defect, unspecified: Secondary | ICD-10-CM | POA: Diagnosis not present

## 2022-12-15 DIAGNOSIS — N186 End stage renal disease: Secondary | ICD-10-CM | POA: Diagnosis not present

## 2022-12-15 DIAGNOSIS — E875 Hyperkalemia: Secondary | ICD-10-CM | POA: Diagnosis not present

## 2022-12-17 DIAGNOSIS — L299 Pruritus, unspecified: Secondary | ICD-10-CM | POA: Diagnosis not present

## 2022-12-17 DIAGNOSIS — D696 Thrombocytopenia, unspecified: Secondary | ICD-10-CM | POA: Diagnosis not present

## 2022-12-17 DIAGNOSIS — N2581 Secondary hyperparathyroidism of renal origin: Secondary | ICD-10-CM | POA: Diagnosis not present

## 2022-12-17 DIAGNOSIS — N186 End stage renal disease: Secondary | ICD-10-CM | POA: Diagnosis not present

## 2022-12-17 DIAGNOSIS — E875 Hyperkalemia: Secondary | ICD-10-CM | POA: Diagnosis not present

## 2022-12-17 DIAGNOSIS — Z992 Dependence on renal dialysis: Secondary | ICD-10-CM | POA: Diagnosis not present

## 2022-12-17 DIAGNOSIS — E119 Type 2 diabetes mellitus without complications: Secondary | ICD-10-CM | POA: Diagnosis not present

## 2022-12-17 DIAGNOSIS — D689 Coagulation defect, unspecified: Secondary | ICD-10-CM | POA: Diagnosis not present

## 2022-12-17 DIAGNOSIS — D509 Iron deficiency anemia, unspecified: Secondary | ICD-10-CM | POA: Diagnosis not present

## 2022-12-20 DIAGNOSIS — D509 Iron deficiency anemia, unspecified: Secondary | ICD-10-CM | POA: Diagnosis not present

## 2022-12-20 DIAGNOSIS — D689 Coagulation defect, unspecified: Secondary | ICD-10-CM | POA: Diagnosis not present

## 2022-12-20 DIAGNOSIS — N2581 Secondary hyperparathyroidism of renal origin: Secondary | ICD-10-CM | POA: Diagnosis not present

## 2022-12-20 DIAGNOSIS — E119 Type 2 diabetes mellitus without complications: Secondary | ICD-10-CM | POA: Diagnosis not present

## 2022-12-20 DIAGNOSIS — D696 Thrombocytopenia, unspecified: Secondary | ICD-10-CM | POA: Diagnosis not present

## 2022-12-20 DIAGNOSIS — Z992 Dependence on renal dialysis: Secondary | ICD-10-CM | POA: Diagnosis not present

## 2022-12-20 DIAGNOSIS — N186 End stage renal disease: Secondary | ICD-10-CM | POA: Diagnosis not present

## 2022-12-20 DIAGNOSIS — E875 Hyperkalemia: Secondary | ICD-10-CM | POA: Diagnosis not present

## 2022-12-20 DIAGNOSIS — L299 Pruritus, unspecified: Secondary | ICD-10-CM | POA: Diagnosis not present

## 2022-12-22 DIAGNOSIS — E875 Hyperkalemia: Secondary | ICD-10-CM | POA: Diagnosis not present

## 2022-12-22 DIAGNOSIS — E119 Type 2 diabetes mellitus without complications: Secondary | ICD-10-CM | POA: Diagnosis not present

## 2022-12-22 DIAGNOSIS — D509 Iron deficiency anemia, unspecified: Secondary | ICD-10-CM | POA: Diagnosis not present

## 2022-12-22 DIAGNOSIS — N186 End stage renal disease: Secondary | ICD-10-CM | POA: Diagnosis not present

## 2022-12-22 DIAGNOSIS — D696 Thrombocytopenia, unspecified: Secondary | ICD-10-CM | POA: Diagnosis not present

## 2022-12-22 DIAGNOSIS — N2581 Secondary hyperparathyroidism of renal origin: Secondary | ICD-10-CM | POA: Diagnosis not present

## 2022-12-22 DIAGNOSIS — Z992 Dependence on renal dialysis: Secondary | ICD-10-CM | POA: Diagnosis not present

## 2022-12-22 DIAGNOSIS — L299 Pruritus, unspecified: Secondary | ICD-10-CM | POA: Diagnosis not present

## 2022-12-22 DIAGNOSIS — D689 Coagulation defect, unspecified: Secondary | ICD-10-CM | POA: Diagnosis not present

## 2022-12-24 DIAGNOSIS — D696 Thrombocytopenia, unspecified: Secondary | ICD-10-CM | POA: Diagnosis not present

## 2022-12-24 DIAGNOSIS — Z992 Dependence on renal dialysis: Secondary | ICD-10-CM | POA: Diagnosis not present

## 2022-12-24 DIAGNOSIS — D689 Coagulation defect, unspecified: Secondary | ICD-10-CM | POA: Diagnosis not present

## 2022-12-24 DIAGNOSIS — N2581 Secondary hyperparathyroidism of renal origin: Secondary | ICD-10-CM | POA: Diagnosis not present

## 2022-12-24 DIAGNOSIS — E875 Hyperkalemia: Secondary | ICD-10-CM | POA: Diagnosis not present

## 2022-12-24 DIAGNOSIS — E119 Type 2 diabetes mellitus without complications: Secondary | ICD-10-CM | POA: Diagnosis not present

## 2022-12-24 DIAGNOSIS — N186 End stage renal disease: Secondary | ICD-10-CM | POA: Diagnosis not present

## 2022-12-24 DIAGNOSIS — L299 Pruritus, unspecified: Secondary | ICD-10-CM | POA: Diagnosis not present

## 2022-12-24 DIAGNOSIS — D509 Iron deficiency anemia, unspecified: Secondary | ICD-10-CM | POA: Diagnosis not present

## 2022-12-27 DIAGNOSIS — N186 End stage renal disease: Secondary | ICD-10-CM | POA: Diagnosis not present

## 2022-12-27 DIAGNOSIS — D689 Coagulation defect, unspecified: Secondary | ICD-10-CM | POA: Diagnosis not present

## 2022-12-27 DIAGNOSIS — L299 Pruritus, unspecified: Secondary | ICD-10-CM | POA: Diagnosis not present

## 2022-12-27 DIAGNOSIS — D509 Iron deficiency anemia, unspecified: Secondary | ICD-10-CM | POA: Diagnosis not present

## 2022-12-27 DIAGNOSIS — E875 Hyperkalemia: Secondary | ICD-10-CM | POA: Diagnosis not present

## 2022-12-27 DIAGNOSIS — Z992 Dependence on renal dialysis: Secondary | ICD-10-CM | POA: Diagnosis not present

## 2022-12-27 DIAGNOSIS — N2581 Secondary hyperparathyroidism of renal origin: Secondary | ICD-10-CM | POA: Diagnosis not present

## 2022-12-27 DIAGNOSIS — D696 Thrombocytopenia, unspecified: Secondary | ICD-10-CM | POA: Diagnosis not present

## 2022-12-27 DIAGNOSIS — E119 Type 2 diabetes mellitus without complications: Secondary | ICD-10-CM | POA: Diagnosis not present

## 2022-12-28 DIAGNOSIS — E119 Type 2 diabetes mellitus without complications: Secondary | ICD-10-CM | POA: Diagnosis not present

## 2022-12-28 DIAGNOSIS — Z7689 Persons encountering health services in other specified circumstances: Secondary | ICD-10-CM | POA: Diagnosis not present

## 2022-12-28 DIAGNOSIS — E785 Hyperlipidemia, unspecified: Secondary | ICD-10-CM | POA: Diagnosis not present

## 2022-12-28 DIAGNOSIS — D638 Anemia in other chronic diseases classified elsewhere: Secondary | ICD-10-CM | POA: Diagnosis not present

## 2022-12-28 DIAGNOSIS — J301 Allergic rhinitis due to pollen: Secondary | ICD-10-CM | POA: Diagnosis not present

## 2022-12-28 DIAGNOSIS — I10 Essential (primary) hypertension: Secondary | ICD-10-CM | POA: Diagnosis not present

## 2022-12-28 DIAGNOSIS — N185 Chronic kidney disease, stage 5: Secondary | ICD-10-CM | POA: Diagnosis not present

## 2022-12-29 DIAGNOSIS — N186 End stage renal disease: Secondary | ICD-10-CM | POA: Diagnosis not present

## 2022-12-29 DIAGNOSIS — E875 Hyperkalemia: Secondary | ICD-10-CM | POA: Diagnosis not present

## 2022-12-29 DIAGNOSIS — D689 Coagulation defect, unspecified: Secondary | ICD-10-CM | POA: Diagnosis not present

## 2022-12-29 DIAGNOSIS — N2581 Secondary hyperparathyroidism of renal origin: Secondary | ICD-10-CM | POA: Diagnosis not present

## 2022-12-29 DIAGNOSIS — Z992 Dependence on renal dialysis: Secondary | ICD-10-CM | POA: Diagnosis not present

## 2022-12-29 DIAGNOSIS — D696 Thrombocytopenia, unspecified: Secondary | ICD-10-CM | POA: Diagnosis not present

## 2022-12-29 DIAGNOSIS — L299 Pruritus, unspecified: Secondary | ICD-10-CM | POA: Diagnosis not present

## 2022-12-29 DIAGNOSIS — E119 Type 2 diabetes mellitus without complications: Secondary | ICD-10-CM | POA: Diagnosis not present

## 2022-12-29 DIAGNOSIS — D509 Iron deficiency anemia, unspecified: Secondary | ICD-10-CM | POA: Diagnosis not present

## 2022-12-31 DIAGNOSIS — D509 Iron deficiency anemia, unspecified: Secondary | ICD-10-CM | POA: Diagnosis not present

## 2022-12-31 DIAGNOSIS — E875 Hyperkalemia: Secondary | ICD-10-CM | POA: Diagnosis not present

## 2022-12-31 DIAGNOSIS — E119 Type 2 diabetes mellitus without complications: Secondary | ICD-10-CM | POA: Diagnosis not present

## 2022-12-31 DIAGNOSIS — D689 Coagulation defect, unspecified: Secondary | ICD-10-CM | POA: Diagnosis not present

## 2022-12-31 DIAGNOSIS — N2581 Secondary hyperparathyroidism of renal origin: Secondary | ICD-10-CM | POA: Diagnosis not present

## 2022-12-31 DIAGNOSIS — D696 Thrombocytopenia, unspecified: Secondary | ICD-10-CM | POA: Diagnosis not present

## 2022-12-31 DIAGNOSIS — N186 End stage renal disease: Secondary | ICD-10-CM | POA: Diagnosis not present

## 2022-12-31 DIAGNOSIS — L299 Pruritus, unspecified: Secondary | ICD-10-CM | POA: Diagnosis not present

## 2022-12-31 DIAGNOSIS — Z992 Dependence on renal dialysis: Secondary | ICD-10-CM | POA: Diagnosis not present

## 2023-01-03 DIAGNOSIS — N186 End stage renal disease: Secondary | ICD-10-CM | POA: Diagnosis not present

## 2023-01-03 DIAGNOSIS — D509 Iron deficiency anemia, unspecified: Secondary | ICD-10-CM | POA: Diagnosis not present

## 2023-01-03 DIAGNOSIS — D696 Thrombocytopenia, unspecified: Secondary | ICD-10-CM | POA: Diagnosis not present

## 2023-01-03 DIAGNOSIS — D689 Coagulation defect, unspecified: Secondary | ICD-10-CM | POA: Diagnosis not present

## 2023-01-03 DIAGNOSIS — E875 Hyperkalemia: Secondary | ICD-10-CM | POA: Diagnosis not present

## 2023-01-03 DIAGNOSIS — L299 Pruritus, unspecified: Secondary | ICD-10-CM | POA: Diagnosis not present

## 2023-01-03 DIAGNOSIS — Z992 Dependence on renal dialysis: Secondary | ICD-10-CM | POA: Diagnosis not present

## 2023-01-03 DIAGNOSIS — N2581 Secondary hyperparathyroidism of renal origin: Secondary | ICD-10-CM | POA: Diagnosis not present

## 2023-01-03 DIAGNOSIS — E119 Type 2 diabetes mellitus without complications: Secondary | ICD-10-CM | POA: Diagnosis not present

## 2023-01-05 DIAGNOSIS — D509 Iron deficiency anemia, unspecified: Secondary | ICD-10-CM | POA: Diagnosis not present

## 2023-01-05 DIAGNOSIS — E119 Type 2 diabetes mellitus without complications: Secondary | ICD-10-CM | POA: Diagnosis not present

## 2023-01-05 DIAGNOSIS — L299 Pruritus, unspecified: Secondary | ICD-10-CM | POA: Diagnosis not present

## 2023-01-05 DIAGNOSIS — D689 Coagulation defect, unspecified: Secondary | ICD-10-CM | POA: Diagnosis not present

## 2023-01-05 DIAGNOSIS — Z992 Dependence on renal dialysis: Secondary | ICD-10-CM | POA: Diagnosis not present

## 2023-01-05 DIAGNOSIS — D696 Thrombocytopenia, unspecified: Secondary | ICD-10-CM | POA: Diagnosis not present

## 2023-01-05 DIAGNOSIS — N186 End stage renal disease: Secondary | ICD-10-CM | POA: Diagnosis not present

## 2023-01-05 DIAGNOSIS — N2581 Secondary hyperparathyroidism of renal origin: Secondary | ICD-10-CM | POA: Diagnosis not present

## 2023-01-05 DIAGNOSIS — E875 Hyperkalemia: Secondary | ICD-10-CM | POA: Diagnosis not present

## 2023-01-06 DIAGNOSIS — Z713 Dietary counseling and surveillance: Secondary | ICD-10-CM | POA: Diagnosis not present

## 2023-01-06 DIAGNOSIS — Z7182 Exercise counseling: Secondary | ICD-10-CM | POA: Diagnosis not present

## 2023-01-07 DIAGNOSIS — N186 End stage renal disease: Secondary | ICD-10-CM | POA: Diagnosis not present

## 2023-01-07 DIAGNOSIS — D509 Iron deficiency anemia, unspecified: Secondary | ICD-10-CM | POA: Diagnosis not present

## 2023-01-07 DIAGNOSIS — D696 Thrombocytopenia, unspecified: Secondary | ICD-10-CM | POA: Diagnosis not present

## 2023-01-07 DIAGNOSIS — E119 Type 2 diabetes mellitus without complications: Secondary | ICD-10-CM | POA: Diagnosis not present

## 2023-01-07 DIAGNOSIS — N2581 Secondary hyperparathyroidism of renal origin: Secondary | ICD-10-CM | POA: Diagnosis not present

## 2023-01-07 DIAGNOSIS — Z992 Dependence on renal dialysis: Secondary | ICD-10-CM | POA: Diagnosis not present

## 2023-01-07 DIAGNOSIS — E875 Hyperkalemia: Secondary | ICD-10-CM | POA: Diagnosis not present

## 2023-01-07 DIAGNOSIS — L299 Pruritus, unspecified: Secondary | ICD-10-CM | POA: Diagnosis not present

## 2023-01-07 DIAGNOSIS — D689 Coagulation defect, unspecified: Secondary | ICD-10-CM | POA: Diagnosis not present

## 2023-01-10 DIAGNOSIS — E875 Hyperkalemia: Secondary | ICD-10-CM | POA: Diagnosis not present

## 2023-01-10 DIAGNOSIS — Z992 Dependence on renal dialysis: Secondary | ICD-10-CM | POA: Diagnosis not present

## 2023-01-10 DIAGNOSIS — L299 Pruritus, unspecified: Secondary | ICD-10-CM | POA: Diagnosis not present

## 2023-01-10 DIAGNOSIS — N2581 Secondary hyperparathyroidism of renal origin: Secondary | ICD-10-CM | POA: Diagnosis not present

## 2023-01-10 DIAGNOSIS — D696 Thrombocytopenia, unspecified: Secondary | ICD-10-CM | POA: Diagnosis not present

## 2023-01-10 DIAGNOSIS — N186 End stage renal disease: Secondary | ICD-10-CM | POA: Diagnosis not present

## 2023-01-10 DIAGNOSIS — D689 Coagulation defect, unspecified: Secondary | ICD-10-CM | POA: Diagnosis not present

## 2023-01-10 DIAGNOSIS — D509 Iron deficiency anemia, unspecified: Secondary | ICD-10-CM | POA: Diagnosis not present

## 2023-01-10 DIAGNOSIS — E119 Type 2 diabetes mellitus without complications: Secondary | ICD-10-CM | POA: Diagnosis not present

## 2023-01-12 DIAGNOSIS — D689 Coagulation defect, unspecified: Secondary | ICD-10-CM | POA: Diagnosis not present

## 2023-01-12 DIAGNOSIS — Z992 Dependence on renal dialysis: Secondary | ICD-10-CM | POA: Diagnosis not present

## 2023-01-12 DIAGNOSIS — N186 End stage renal disease: Secondary | ICD-10-CM | POA: Diagnosis not present

## 2023-01-12 DIAGNOSIS — E875 Hyperkalemia: Secondary | ICD-10-CM | POA: Diagnosis not present

## 2023-01-12 DIAGNOSIS — E119 Type 2 diabetes mellitus without complications: Secondary | ICD-10-CM | POA: Diagnosis not present

## 2023-01-12 DIAGNOSIS — L299 Pruritus, unspecified: Secondary | ICD-10-CM | POA: Diagnosis not present

## 2023-01-12 DIAGNOSIS — D509 Iron deficiency anemia, unspecified: Secondary | ICD-10-CM | POA: Diagnosis not present

## 2023-01-12 DIAGNOSIS — N2581 Secondary hyperparathyroidism of renal origin: Secondary | ICD-10-CM | POA: Diagnosis not present

## 2023-01-12 DIAGNOSIS — D696 Thrombocytopenia, unspecified: Secondary | ICD-10-CM | POA: Diagnosis not present

## 2023-01-14 DIAGNOSIS — E875 Hyperkalemia: Secondary | ICD-10-CM | POA: Diagnosis not present

## 2023-01-14 DIAGNOSIS — Z992 Dependence on renal dialysis: Secondary | ICD-10-CM | POA: Diagnosis not present

## 2023-01-14 DIAGNOSIS — D509 Iron deficiency anemia, unspecified: Secondary | ICD-10-CM | POA: Diagnosis not present

## 2023-01-14 DIAGNOSIS — D689 Coagulation defect, unspecified: Secondary | ICD-10-CM | POA: Diagnosis not present

## 2023-01-14 DIAGNOSIS — N186 End stage renal disease: Secondary | ICD-10-CM | POA: Diagnosis not present

## 2023-01-14 DIAGNOSIS — N2581 Secondary hyperparathyroidism of renal origin: Secondary | ICD-10-CM | POA: Diagnosis not present

## 2023-01-14 DIAGNOSIS — D696 Thrombocytopenia, unspecified: Secondary | ICD-10-CM | POA: Diagnosis not present

## 2023-01-17 DIAGNOSIS — D509 Iron deficiency anemia, unspecified: Secondary | ICD-10-CM | POA: Diagnosis not present

## 2023-01-17 DIAGNOSIS — N2581 Secondary hyperparathyroidism of renal origin: Secondary | ICD-10-CM | POA: Diagnosis not present

## 2023-01-17 DIAGNOSIS — D696 Thrombocytopenia, unspecified: Secondary | ICD-10-CM | POA: Diagnosis not present

## 2023-01-17 DIAGNOSIS — Z992 Dependence on renal dialysis: Secondary | ICD-10-CM | POA: Diagnosis not present

## 2023-01-17 DIAGNOSIS — D689 Coagulation defect, unspecified: Secondary | ICD-10-CM | POA: Diagnosis not present

## 2023-01-17 DIAGNOSIS — N186 End stage renal disease: Secondary | ICD-10-CM | POA: Diagnosis not present

## 2023-01-17 DIAGNOSIS — E875 Hyperkalemia: Secondary | ICD-10-CM | POA: Diagnosis not present

## 2023-01-19 DIAGNOSIS — D689 Coagulation defect, unspecified: Secondary | ICD-10-CM | POA: Diagnosis not present

## 2023-01-19 DIAGNOSIS — N2581 Secondary hyperparathyroidism of renal origin: Secondary | ICD-10-CM | POA: Diagnosis not present

## 2023-01-19 DIAGNOSIS — E875 Hyperkalemia: Secondary | ICD-10-CM | POA: Diagnosis not present

## 2023-01-19 DIAGNOSIS — N186 End stage renal disease: Secondary | ICD-10-CM | POA: Diagnosis not present

## 2023-01-19 DIAGNOSIS — D696 Thrombocytopenia, unspecified: Secondary | ICD-10-CM | POA: Diagnosis not present

## 2023-01-19 DIAGNOSIS — D509 Iron deficiency anemia, unspecified: Secondary | ICD-10-CM | POA: Diagnosis not present

## 2023-01-19 DIAGNOSIS — Z992 Dependence on renal dialysis: Secondary | ICD-10-CM | POA: Diagnosis not present

## 2023-01-21 DIAGNOSIS — Z992 Dependence on renal dialysis: Secondary | ICD-10-CM | POA: Diagnosis not present

## 2023-01-21 DIAGNOSIS — D696 Thrombocytopenia, unspecified: Secondary | ICD-10-CM | POA: Diagnosis not present

## 2023-01-21 DIAGNOSIS — D509 Iron deficiency anemia, unspecified: Secondary | ICD-10-CM | POA: Diagnosis not present

## 2023-01-21 DIAGNOSIS — E875 Hyperkalemia: Secondary | ICD-10-CM | POA: Diagnosis not present

## 2023-01-21 DIAGNOSIS — N2581 Secondary hyperparathyroidism of renal origin: Secondary | ICD-10-CM | POA: Diagnosis not present

## 2023-01-21 DIAGNOSIS — D689 Coagulation defect, unspecified: Secondary | ICD-10-CM | POA: Diagnosis not present

## 2023-01-21 DIAGNOSIS — N186 End stage renal disease: Secondary | ICD-10-CM | POA: Diagnosis not present

## 2023-01-24 DIAGNOSIS — Z992 Dependence on renal dialysis: Secondary | ICD-10-CM | POA: Diagnosis not present

## 2023-01-24 DIAGNOSIS — D689 Coagulation defect, unspecified: Secondary | ICD-10-CM | POA: Diagnosis not present

## 2023-01-24 DIAGNOSIS — E875 Hyperkalemia: Secondary | ICD-10-CM | POA: Diagnosis not present

## 2023-01-24 DIAGNOSIS — D509 Iron deficiency anemia, unspecified: Secondary | ICD-10-CM | POA: Diagnosis not present

## 2023-01-24 DIAGNOSIS — N186 End stage renal disease: Secondary | ICD-10-CM | POA: Diagnosis not present

## 2023-01-24 DIAGNOSIS — D696 Thrombocytopenia, unspecified: Secondary | ICD-10-CM | POA: Diagnosis not present

## 2023-01-24 DIAGNOSIS — N2581 Secondary hyperparathyroidism of renal origin: Secondary | ICD-10-CM | POA: Diagnosis not present

## 2023-01-25 DIAGNOSIS — M25572 Pain in left ankle and joints of left foot: Secondary | ICD-10-CM | POA: Diagnosis not present

## 2023-01-25 DIAGNOSIS — L03116 Cellulitis of left lower limb: Secondary | ICD-10-CM | POA: Diagnosis not present

## 2023-01-26 DIAGNOSIS — Z992 Dependence on renal dialysis: Secondary | ICD-10-CM | POA: Diagnosis not present

## 2023-01-26 DIAGNOSIS — D696 Thrombocytopenia, unspecified: Secondary | ICD-10-CM | POA: Diagnosis not present

## 2023-01-26 DIAGNOSIS — E875 Hyperkalemia: Secondary | ICD-10-CM | POA: Diagnosis not present

## 2023-01-26 DIAGNOSIS — D689 Coagulation defect, unspecified: Secondary | ICD-10-CM | POA: Diagnosis not present

## 2023-01-26 DIAGNOSIS — D509 Iron deficiency anemia, unspecified: Secondary | ICD-10-CM | POA: Diagnosis not present

## 2023-01-26 DIAGNOSIS — N2581 Secondary hyperparathyroidism of renal origin: Secondary | ICD-10-CM | POA: Diagnosis not present

## 2023-01-26 DIAGNOSIS — N186 End stage renal disease: Secondary | ICD-10-CM | POA: Diagnosis not present

## 2023-01-28 DIAGNOSIS — Z992 Dependence on renal dialysis: Secondary | ICD-10-CM | POA: Diagnosis not present

## 2023-01-28 DIAGNOSIS — D509 Iron deficiency anemia, unspecified: Secondary | ICD-10-CM | POA: Diagnosis not present

## 2023-01-28 DIAGNOSIS — N186 End stage renal disease: Secondary | ICD-10-CM | POA: Diagnosis not present

## 2023-01-28 DIAGNOSIS — N2581 Secondary hyperparathyroidism of renal origin: Secondary | ICD-10-CM | POA: Diagnosis not present

## 2023-01-28 DIAGNOSIS — E875 Hyperkalemia: Secondary | ICD-10-CM | POA: Diagnosis not present

## 2023-01-28 DIAGNOSIS — D689 Coagulation defect, unspecified: Secondary | ICD-10-CM | POA: Diagnosis not present

## 2023-01-28 DIAGNOSIS — D696 Thrombocytopenia, unspecified: Secondary | ICD-10-CM | POA: Diagnosis not present

## 2023-01-31 DIAGNOSIS — D509 Iron deficiency anemia, unspecified: Secondary | ICD-10-CM | POA: Diagnosis not present

## 2023-01-31 DIAGNOSIS — D696 Thrombocytopenia, unspecified: Secondary | ICD-10-CM | POA: Diagnosis not present

## 2023-01-31 DIAGNOSIS — D689 Coagulation defect, unspecified: Secondary | ICD-10-CM | POA: Diagnosis not present

## 2023-01-31 DIAGNOSIS — N186 End stage renal disease: Secondary | ICD-10-CM | POA: Diagnosis not present

## 2023-01-31 DIAGNOSIS — E119 Type 2 diabetes mellitus without complications: Secondary | ICD-10-CM | POA: Diagnosis not present

## 2023-01-31 DIAGNOSIS — N2581 Secondary hyperparathyroidism of renal origin: Secondary | ICD-10-CM | POA: Diagnosis not present

## 2023-01-31 DIAGNOSIS — E875 Hyperkalemia: Secondary | ICD-10-CM | POA: Diagnosis not present

## 2023-01-31 DIAGNOSIS — Z992 Dependence on renal dialysis: Secondary | ICD-10-CM | POA: Diagnosis not present

## 2023-02-01 DIAGNOSIS — M79605 Pain in left leg: Secondary | ICD-10-CM | POA: Diagnosis not present

## 2023-02-02 DIAGNOSIS — N186 End stage renal disease: Secondary | ICD-10-CM | POA: Diagnosis not present

## 2023-02-02 DIAGNOSIS — D509 Iron deficiency anemia, unspecified: Secondary | ICD-10-CM | POA: Diagnosis not present

## 2023-02-02 DIAGNOSIS — E875 Hyperkalemia: Secondary | ICD-10-CM | POA: Diagnosis not present

## 2023-02-02 DIAGNOSIS — D689 Coagulation defect, unspecified: Secondary | ICD-10-CM | POA: Diagnosis not present

## 2023-02-02 DIAGNOSIS — D696 Thrombocytopenia, unspecified: Secondary | ICD-10-CM | POA: Diagnosis not present

## 2023-02-02 DIAGNOSIS — Z992 Dependence on renal dialysis: Secondary | ICD-10-CM | POA: Diagnosis not present

## 2023-02-02 DIAGNOSIS — N2581 Secondary hyperparathyroidism of renal origin: Secondary | ICD-10-CM | POA: Diagnosis not present

## 2023-02-04 DIAGNOSIS — N2581 Secondary hyperparathyroidism of renal origin: Secondary | ICD-10-CM | POA: Diagnosis not present

## 2023-02-04 DIAGNOSIS — D689 Coagulation defect, unspecified: Secondary | ICD-10-CM | POA: Diagnosis not present

## 2023-02-04 DIAGNOSIS — N186 End stage renal disease: Secondary | ICD-10-CM | POA: Diagnosis not present

## 2023-02-04 DIAGNOSIS — D696 Thrombocytopenia, unspecified: Secondary | ICD-10-CM | POA: Diagnosis not present

## 2023-02-04 DIAGNOSIS — D509 Iron deficiency anemia, unspecified: Secondary | ICD-10-CM | POA: Diagnosis not present

## 2023-02-04 DIAGNOSIS — E875 Hyperkalemia: Secondary | ICD-10-CM | POA: Diagnosis not present

## 2023-02-04 DIAGNOSIS — Z992 Dependence on renal dialysis: Secondary | ICD-10-CM | POA: Diagnosis not present

## 2023-02-07 DIAGNOSIS — D509 Iron deficiency anemia, unspecified: Secondary | ICD-10-CM | POA: Diagnosis not present

## 2023-02-07 DIAGNOSIS — D689 Coagulation defect, unspecified: Secondary | ICD-10-CM | POA: Diagnosis not present

## 2023-02-07 DIAGNOSIS — E875 Hyperkalemia: Secondary | ICD-10-CM | POA: Diagnosis not present

## 2023-02-07 DIAGNOSIS — N186 End stage renal disease: Secondary | ICD-10-CM | POA: Diagnosis not present

## 2023-02-07 DIAGNOSIS — D696 Thrombocytopenia, unspecified: Secondary | ICD-10-CM | POA: Diagnosis not present

## 2023-02-07 DIAGNOSIS — Z992 Dependence on renal dialysis: Secondary | ICD-10-CM | POA: Diagnosis not present

## 2023-02-07 DIAGNOSIS — N2581 Secondary hyperparathyroidism of renal origin: Secondary | ICD-10-CM | POA: Diagnosis not present

## 2023-02-09 DIAGNOSIS — N2581 Secondary hyperparathyroidism of renal origin: Secondary | ICD-10-CM | POA: Diagnosis not present

## 2023-02-09 DIAGNOSIS — D696 Thrombocytopenia, unspecified: Secondary | ICD-10-CM | POA: Diagnosis not present

## 2023-02-09 DIAGNOSIS — N186 End stage renal disease: Secondary | ICD-10-CM | POA: Diagnosis not present

## 2023-02-09 DIAGNOSIS — D509 Iron deficiency anemia, unspecified: Secondary | ICD-10-CM | POA: Diagnosis not present

## 2023-02-09 DIAGNOSIS — D689 Coagulation defect, unspecified: Secondary | ICD-10-CM | POA: Diagnosis not present

## 2023-02-09 DIAGNOSIS — Z992 Dependence on renal dialysis: Secondary | ICD-10-CM | POA: Diagnosis not present

## 2023-02-09 DIAGNOSIS — E875 Hyperkalemia: Secondary | ICD-10-CM | POA: Diagnosis not present

## 2023-02-11 DIAGNOSIS — Z992 Dependence on renal dialysis: Secondary | ICD-10-CM | POA: Diagnosis not present

## 2023-02-11 DIAGNOSIS — D696 Thrombocytopenia, unspecified: Secondary | ICD-10-CM | POA: Diagnosis not present

## 2023-02-11 DIAGNOSIS — D689 Coagulation defect, unspecified: Secondary | ICD-10-CM | POA: Diagnosis not present

## 2023-02-11 DIAGNOSIS — N186 End stage renal disease: Secondary | ICD-10-CM | POA: Diagnosis not present

## 2023-02-11 DIAGNOSIS — E875 Hyperkalemia: Secondary | ICD-10-CM | POA: Diagnosis not present

## 2023-02-11 DIAGNOSIS — D509 Iron deficiency anemia, unspecified: Secondary | ICD-10-CM | POA: Diagnosis not present

## 2023-02-11 DIAGNOSIS — N2581 Secondary hyperparathyroidism of renal origin: Secondary | ICD-10-CM | POA: Diagnosis not present

## 2023-02-12 DIAGNOSIS — Z992 Dependence on renal dialysis: Secondary | ICD-10-CM | POA: Diagnosis not present

## 2023-02-12 DIAGNOSIS — N186 End stage renal disease: Secondary | ICD-10-CM | POA: Diagnosis not present

## 2023-02-14 DIAGNOSIS — D689 Coagulation defect, unspecified: Secondary | ICD-10-CM | POA: Diagnosis not present

## 2023-02-14 DIAGNOSIS — N186 End stage renal disease: Secondary | ICD-10-CM | POA: Diagnosis not present

## 2023-02-14 DIAGNOSIS — Z992 Dependence on renal dialysis: Secondary | ICD-10-CM | POA: Diagnosis not present

## 2023-02-14 DIAGNOSIS — N2581 Secondary hyperparathyroidism of renal origin: Secondary | ICD-10-CM | POA: Diagnosis not present

## 2023-02-16 DIAGNOSIS — N2581 Secondary hyperparathyroidism of renal origin: Secondary | ICD-10-CM | POA: Diagnosis not present

## 2023-02-16 DIAGNOSIS — D689 Coagulation defect, unspecified: Secondary | ICD-10-CM | POA: Diagnosis not present

## 2023-02-16 DIAGNOSIS — Z992 Dependence on renal dialysis: Secondary | ICD-10-CM | POA: Diagnosis not present

## 2023-02-16 DIAGNOSIS — N186 End stage renal disease: Secondary | ICD-10-CM | POA: Diagnosis not present

## 2023-02-17 ENCOUNTER — Emergency Department (HOSPITAL_COMMUNITY): Payer: Medicare Other

## 2023-02-17 ENCOUNTER — Other Ambulatory Visit: Payer: Self-pay

## 2023-02-17 ENCOUNTER — Inpatient Hospital Stay (HOSPITAL_COMMUNITY)
Admission: EM | Admit: 2023-02-17 | Discharge: 2023-02-28 | DRG: 270 | Disposition: A | Discharge status: Skilled Nursing Facility | Payer: Medicare Other | Attending: Family Medicine | Admitting: Family Medicine

## 2023-02-17 ENCOUNTER — Encounter (HOSPITAL_COMMUNITY): Payer: Self-pay | Admitting: Emergency Medicine

## 2023-02-17 DIAGNOSIS — E11621 Type 2 diabetes mellitus with foot ulcer: Secondary | ICD-10-CM | POA: Diagnosis present

## 2023-02-17 DIAGNOSIS — I96 Gangrene, not elsewhere classified: Principal | ICD-10-CM | POA: Diagnosis present

## 2023-02-17 DIAGNOSIS — I440 Atrioventricular block, first degree: Secondary | ICD-10-CM | POA: Diagnosis present

## 2023-02-17 DIAGNOSIS — E1165 Type 2 diabetes mellitus with hyperglycemia: Secondary | ICD-10-CM | POA: Diagnosis present

## 2023-02-17 DIAGNOSIS — R0989 Other specified symptoms and signs involving the circulatory and respiratory systems: Secondary | ICD-10-CM | POA: Diagnosis present

## 2023-02-17 DIAGNOSIS — I953 Hypotension of hemodialysis: Secondary | ICD-10-CM | POA: Diagnosis not present

## 2023-02-17 DIAGNOSIS — E1152 Type 2 diabetes mellitus with diabetic peripheral angiopathy with gangrene: Principal | ICD-10-CM | POA: Diagnosis present

## 2023-02-17 DIAGNOSIS — N250 Renal osteodystrophy: Secondary | ICD-10-CM | POA: Diagnosis not present

## 2023-02-17 DIAGNOSIS — L03116 Cellulitis of left lower limb: Secondary | ICD-10-CM | POA: Diagnosis not present

## 2023-02-17 DIAGNOSIS — I2489 Other forms of acute ischemic heart disease: Secondary | ICD-10-CM | POA: Diagnosis not present

## 2023-02-17 DIAGNOSIS — Z86010 Personal history of colonic polyps: Secondary | ICD-10-CM

## 2023-02-17 DIAGNOSIS — L089 Local infection of the skin and subcutaneous tissue, unspecified: Secondary | ICD-10-CM | POA: Diagnosis not present

## 2023-02-17 DIAGNOSIS — I083 Combined rheumatic disorders of mitral, aortic and tricuspid valves: Secondary | ICD-10-CM | POA: Diagnosis present

## 2023-02-17 DIAGNOSIS — I700 Atherosclerosis of aorta: Secondary | ICD-10-CM | POA: Diagnosis not present

## 2023-02-17 DIAGNOSIS — I120 Hypertensive chronic kidney disease with stage 5 chronic kidney disease or end stage renal disease: Secondary | ICD-10-CM | POA: Diagnosis not present

## 2023-02-17 DIAGNOSIS — Z87891 Personal history of nicotine dependence: Secondary | ICD-10-CM

## 2023-02-17 DIAGNOSIS — I350 Nonrheumatic aortic (valve) stenosis: Secondary | ICD-10-CM | POA: Diagnosis not present

## 2023-02-17 DIAGNOSIS — Z951 Presence of aortocoronary bypass graft: Secondary | ICD-10-CM | POA: Diagnosis not present

## 2023-02-17 DIAGNOSIS — J189 Pneumonia, unspecified organism: Secondary | ICD-10-CM | POA: Diagnosis not present

## 2023-02-17 DIAGNOSIS — E875 Hyperkalemia: Secondary | ICD-10-CM | POA: Diagnosis not present

## 2023-02-17 DIAGNOSIS — Z87440 Personal history of urinary (tract) infections: Secondary | ICD-10-CM

## 2023-02-17 DIAGNOSIS — Z01810 Encounter for preprocedural cardiovascular examination: Secondary | ICD-10-CM

## 2023-02-17 DIAGNOSIS — Z794 Long term (current) use of insulin: Secondary | ICD-10-CM

## 2023-02-17 DIAGNOSIS — Z743 Need for continuous supervision: Secondary | ICD-10-CM | POA: Diagnosis not present

## 2023-02-17 DIAGNOSIS — E7800 Pure hypercholesterolemia, unspecified: Secondary | ICD-10-CM | POA: Diagnosis not present

## 2023-02-17 DIAGNOSIS — N186 End stage renal disease: Secondary | ICD-10-CM | POA: Diagnosis present

## 2023-02-17 DIAGNOSIS — Z833 Family history of diabetes mellitus: Secondary | ICD-10-CM

## 2023-02-17 DIAGNOSIS — Z7902 Long term (current) use of antithrombotics/antiplatelets: Secondary | ICD-10-CM

## 2023-02-17 DIAGNOSIS — N281 Cyst of kidney, acquired: Secondary | ICD-10-CM | POA: Diagnosis present

## 2023-02-17 DIAGNOSIS — R079 Chest pain, unspecified: Secondary | ICD-10-CM | POA: Diagnosis not present

## 2023-02-17 DIAGNOSIS — Z48812 Encounter for surgical aftercare following surgery on the circulatory system: Secondary | ICD-10-CM | POA: Diagnosis not present

## 2023-02-17 DIAGNOSIS — E876 Hypokalemia: Secondary | ICD-10-CM | POA: Diagnosis not present

## 2023-02-17 DIAGNOSIS — I70262 Atherosclerosis of native arteries of extremities with gangrene, left leg: Secondary | ICD-10-CM | POA: Diagnosis not present

## 2023-02-17 DIAGNOSIS — Z91013 Allergy to seafood: Secondary | ICD-10-CM

## 2023-02-17 DIAGNOSIS — R0602 Shortness of breath: Secondary | ICD-10-CM | POA: Diagnosis not present

## 2023-02-17 DIAGNOSIS — I10 Essential (primary) hypertension: Secondary | ICD-10-CM | POA: Diagnosis not present

## 2023-02-17 DIAGNOSIS — I2510 Atherosclerotic heart disease of native coronary artery without angina pectoris: Secondary | ICD-10-CM | POA: Diagnosis not present

## 2023-02-17 DIAGNOSIS — J90 Pleural effusion, not elsewhere classified: Secondary | ICD-10-CM | POA: Diagnosis not present

## 2023-02-17 DIAGNOSIS — Z720 Tobacco use: Secondary | ICD-10-CM

## 2023-02-17 DIAGNOSIS — I517 Cardiomegaly: Secondary | ICD-10-CM | POA: Diagnosis not present

## 2023-02-17 DIAGNOSIS — N2581 Secondary hyperparathyroidism of renal origin: Secondary | ICD-10-CM | POA: Diagnosis present

## 2023-02-17 DIAGNOSIS — I4510 Unspecified right bundle-branch block: Secondary | ICD-10-CM | POA: Diagnosis present

## 2023-02-17 DIAGNOSIS — M898X9 Other specified disorders of bone, unspecified site: Secondary | ICD-10-CM | POA: Diagnosis present

## 2023-02-17 DIAGNOSIS — M79675 Pain in left toe(s): Secondary | ICD-10-CM | POA: Diagnosis not present

## 2023-02-17 DIAGNOSIS — R7989 Other specified abnormal findings of blood chemistry: Secondary | ICD-10-CM | POA: Diagnosis not present

## 2023-02-17 DIAGNOSIS — I25119 Atherosclerotic heart disease of native coronary artery with unspecified angina pectoris: Secondary | ICD-10-CM | POA: Diagnosis present

## 2023-02-17 DIAGNOSIS — Z79899 Other long term (current) drug therapy: Secondary | ICD-10-CM

## 2023-02-17 DIAGNOSIS — I252 Old myocardial infarction: Secondary | ICD-10-CM

## 2023-02-17 DIAGNOSIS — Z992 Dependence on renal dialysis: Secondary | ICD-10-CM

## 2023-02-17 DIAGNOSIS — Z602 Problems related to living alone: Secondary | ICD-10-CM | POA: Diagnosis present

## 2023-02-17 DIAGNOSIS — I70245 Atherosclerosis of native arteries of left leg with ulceration of other part of foot: Secondary | ICD-10-CM | POA: Diagnosis not present

## 2023-02-17 DIAGNOSIS — E860 Dehydration: Secondary | ICD-10-CM | POA: Diagnosis not present

## 2023-02-17 DIAGNOSIS — Z8249 Family history of ischemic heart disease and other diseases of the circulatory system: Secondary | ICD-10-CM

## 2023-02-17 DIAGNOSIS — Z9889 Other specified postprocedural states: Secondary | ICD-10-CM

## 2023-02-17 DIAGNOSIS — F32A Depression, unspecified: Secondary | ICD-10-CM | POA: Diagnosis present

## 2023-02-17 DIAGNOSIS — D631 Anemia in chronic kidney disease: Secondary | ICD-10-CM | POA: Diagnosis not present

## 2023-02-17 DIAGNOSIS — Z7982 Long term (current) use of aspirin: Secondary | ICD-10-CM

## 2023-02-17 DIAGNOSIS — I6521 Occlusion and stenosis of right carotid artery: Secondary | ICD-10-CM | POA: Diagnosis present

## 2023-02-17 DIAGNOSIS — R918 Other nonspecific abnormal finding of lung field: Secondary | ICD-10-CM | POA: Diagnosis not present

## 2023-02-17 DIAGNOSIS — L97529 Non-pressure chronic ulcer of other part of left foot with unspecified severity: Secondary | ICD-10-CM | POA: Diagnosis present

## 2023-02-17 DIAGNOSIS — I132 Hypertensive heart and chronic kidney disease with heart failure and with stage 5 chronic kidney disease, or end stage renal disease: Secondary | ICD-10-CM | POA: Diagnosis not present

## 2023-02-17 DIAGNOSIS — R531 Weakness: Secondary | ICD-10-CM | POA: Diagnosis not present

## 2023-02-17 DIAGNOSIS — E1140 Type 2 diabetes mellitus with diabetic neuropathy, unspecified: Secondary | ICD-10-CM | POA: Diagnosis present

## 2023-02-17 DIAGNOSIS — K529 Noninfective gastroenteritis and colitis, unspecified: Secondary | ICD-10-CM | POA: Diagnosis not present

## 2023-02-17 DIAGNOSIS — R9389 Abnormal findings on diagnostic imaging of other specified body structures: Secondary | ICD-10-CM | POA: Diagnosis not present

## 2023-02-17 DIAGNOSIS — Z0189 Encounter for other specified special examinations: Secondary | ICD-10-CM | POA: Diagnosis not present

## 2023-02-17 DIAGNOSIS — Z955 Presence of coronary angioplasty implant and graft: Secondary | ICD-10-CM

## 2023-02-17 DIAGNOSIS — R0600 Dyspnea, unspecified: Secondary | ICD-10-CM | POA: Diagnosis not present

## 2023-02-17 DIAGNOSIS — L0889 Other specified local infections of the skin and subcutaneous tissue: Secondary | ICD-10-CM | POA: Diagnosis not present

## 2023-02-17 DIAGNOSIS — E1122 Type 2 diabetes mellitus with diabetic chronic kidney disease: Secondary | ICD-10-CM | POA: Diagnosis not present

## 2023-02-17 DIAGNOSIS — M6281 Muscle weakness (generalized): Secondary | ICD-10-CM | POA: Diagnosis not present

## 2023-02-17 DIAGNOSIS — Z87442 Personal history of urinary calculi: Secondary | ICD-10-CM

## 2023-02-17 DIAGNOSIS — E871 Hypo-osmolality and hyponatremia: Secondary | ICD-10-CM | POA: Diagnosis not present

## 2023-02-17 DIAGNOSIS — I5032 Chronic diastolic (congestive) heart failure: Secondary | ICD-10-CM | POA: Diagnosis not present

## 2023-02-17 DIAGNOSIS — J984 Other disorders of lung: Secondary | ICD-10-CM | POA: Diagnosis not present

## 2023-02-17 DIAGNOSIS — E11628 Type 2 diabetes mellitus with other skin complications: Secondary | ICD-10-CM | POA: Diagnosis not present

## 2023-02-17 DIAGNOSIS — Z95820 Peripheral vascular angioplasty status with implants and grafts: Secondary | ICD-10-CM | POA: Diagnosis not present

## 2023-02-17 DIAGNOSIS — Z7401 Bed confinement status: Secondary | ICD-10-CM | POA: Diagnosis not present

## 2023-02-17 DIAGNOSIS — M1990 Unspecified osteoarthritis, unspecified site: Secondary | ICD-10-CM | POA: Diagnosis present

## 2023-02-17 LAB — CBC WITH DIFFERENTIAL/PLATELET
Abs Immature Granulocytes: 0.07 10*3/uL (ref 0.00–0.07)
Basophils Absolute: 0 10*3/uL (ref 0.0–0.1)
Basophils Relative: 0 %
Eosinophils Absolute: 0.1 10*3/uL (ref 0.0–0.5)
Eosinophils Relative: 1 %
HCT: 42.4 % (ref 39.0–52.0)
Hemoglobin: 13 g/dL (ref 13.0–17.0)
Immature Granulocytes: 1 %
Lymphocytes Relative: 5 %
Lymphs Abs: 0.8 10*3/uL (ref 0.7–4.0)
MCH: 29.5 pg (ref 26.0–34.0)
MCHC: 30.7 g/dL (ref 30.0–36.0)
MCV: 96.4 fL (ref 80.0–100.0)
Monocytes Absolute: 1 10*3/uL (ref 0.1–1.0)
Monocytes Relative: 7 %
Neutro Abs: 13.2 10*3/uL — ABNORMAL HIGH (ref 1.7–7.7)
Neutrophils Relative %: 86 %
Platelets: 279 10*3/uL (ref 150–400)
RBC: 4.4 MIL/uL (ref 4.22–5.81)
RDW: 17.1 % — ABNORMAL HIGH (ref 11.5–15.5)
WBC: 15.2 10*3/uL — ABNORMAL HIGH (ref 4.0–10.5)
nRBC: 0 % (ref 0.0–0.2)

## 2023-02-17 LAB — BASIC METABOLIC PANEL
Anion gap: 19 — ABNORMAL HIGH (ref 5–15)
BUN: 52 mg/dL — ABNORMAL HIGH (ref 8–23)
CO2: 25 mmol/L (ref 22–32)
Calcium: 8.8 mg/dL — ABNORMAL LOW (ref 8.9–10.3)
Chloride: 86 mmol/L — ABNORMAL LOW (ref 98–111)
Creatinine, Ser: 6.8 mg/dL — ABNORMAL HIGH (ref 0.61–1.24)
GFR, Estimated: 8 mL/min — ABNORMAL LOW (ref >60)
Glucose, Bld: 112 mg/dL — ABNORMAL HIGH (ref 70–99)
Potassium: 4.4 mmol/L (ref 3.5–5.1)
Sodium: 130 mmol/L — ABNORMAL LOW (ref 135–145)

## 2023-02-17 LAB — HEMOGLOBIN A1C
Hgb A1c MFr Bld: 8.4 % — ABNORMAL HIGH (ref 4.8–5.6)
Mean Plasma Glucose: 194.38 mg/dL

## 2023-02-17 LAB — GLUCOSE, CAPILLARY
Glucose-Capillary: 67 mg/dL — ABNORMAL LOW (ref 70–99)
Glucose-Capillary: 206 mg/dL — ABNORMAL HIGH (ref 70–99)

## 2023-02-17 LAB — CBG MONITORING, ED: Glucose-Capillary: 101 mg/dL — ABNORMAL HIGH (ref 70–99)

## 2023-02-17 LAB — SEDIMENTATION RATE: Sed Rate: 72 mm/h — ABNORMAL HIGH (ref 0–16)

## 2023-02-17 LAB — C-REACTIVE PROTEIN: CRP: 22.7 mg/dL — ABNORMAL HIGH (ref <1.0)

## 2023-02-17 MED ORDER — ACETAMINOPHEN 325 MG PO TABS
650.0000 mg | ORAL_TABLET | Freq: Four times a day (QID) | ORAL | Status: DC | PRN
  Administered 2023-02-17 – 2023-02-18 (×2): 650 mg via ORAL
  Filled 2023-02-17 (×2): qty 2

## 2023-02-17 MED ORDER — ONDANSETRON HCL 4 MG/2ML IJ SOLN: 4.0000 mg | Freq: Four times a day (QID) | INTRAMUSCULAR | Status: DC | PRN

## 2023-02-17 MED ORDER — HYDROXYZINE HCL 25 MG PO TABS
25.0000 mg | ORAL_TABLET | Freq: Once | ORAL | Status: AC
  Administered 2023-02-17: 25 mg via ORAL
  Filled 2023-02-17: qty 1

## 2023-02-17 MED ORDER — ONDANSETRON HCL 4 MG/2ML IJ SOLN
4.0000 mg | Freq: Once | INTRAMUSCULAR | Status: AC
  Administered 2023-02-17: 4 mg via INTRAVENOUS
  Filled 2023-02-17: qty 2

## 2023-02-17 MED ORDER — MINOXIDIL 2.5 MG PO TABS
5.0000 mg | ORAL_TABLET | Freq: Every day | ORAL | Status: DC
  Administered 2023-02-17 – 2023-02-19 (×2): 5 mg via ORAL
  Filled 2023-02-17 (×3): qty 2

## 2023-02-17 MED ORDER — HYDRALAZINE HCL 50 MG PO TABS
50.0000 mg | ORAL_TABLET | Freq: Three times a day (TID) | ORAL | Status: DC
  Administered 2023-02-17 – 2023-02-19 (×6): 50 mg via ORAL
  Filled 2023-02-17 (×6): qty 1

## 2023-02-17 MED ORDER — VERAPAMIL HCL ER 120 MG PO TBCR
120.0000 mg | EXTENDED_RELEASE_TABLET | ORAL | Status: DC
  Administered 2023-02-19: 120 mg via ORAL
  Filled 2023-02-17 (×2): qty 1

## 2023-02-17 MED ORDER — INFLUENZA VAC A&B SURF ANT ADJ 0.5 ML IM SUSY
0.5000 mL | PREFILLED_SYRINGE | INTRAMUSCULAR | Status: DC
  Filled 2023-02-17: qty 0.5

## 2023-02-17 MED ORDER — LATANOPROST 0.005 % OP SOLN
2.0000 [drp] | Freq: Two times a day (BID) | OPHTHALMIC | Status: DC
  Administered 2023-02-17 – 2023-02-28 (×19): 2 [drp] via OPHTHALMIC
  Filled 2023-02-17 (×2): qty 2.5

## 2023-02-17 MED ORDER — SODIUM CHLORIDE 0.9 % IV SOLN
2.0000 g | INTRAVENOUS | Status: DC
  Administered 2023-02-18 – 2023-02-19 (×2): 2 g via INTRAVENOUS
  Filled 2023-02-17 (×2): qty 20

## 2023-02-17 MED ORDER — SODIUM CHLORIDE 0.9 % IV SOLN
2.0000 g | Freq: Once | INTRAVENOUS | Status: AC
  Administered 2023-02-17: 2 g via INTRAVENOUS
  Filled 2023-02-17: qty 20

## 2023-02-17 MED ORDER — HEPARIN SODIUM (PORCINE) 5000 UNIT/ML IJ SOLN
5000.0000 [IU] | Freq: Three times a day (TID) | INTRAMUSCULAR | Status: DC
  Administered 2023-02-17 – 2023-02-23 (×13): 5000 [IU] via SUBCUTANEOUS
  Filled 2023-02-17 (×14): qty 1

## 2023-02-17 MED ORDER — ATORVASTATIN CALCIUM 80 MG PO TABS
80.0000 mg | ORAL_TABLET | Freq: Every day | ORAL | Status: DC
  Administered 2023-02-18 – 2023-02-28 (×11): 80 mg via ORAL
  Filled 2023-02-17 (×11): qty 1

## 2023-02-17 MED ORDER — BRIMONIDINE TARTRATE 0.2 % OP SOLN
1.0000 [drp] | Freq: Every morning | OPHTHALMIC | Status: DC
  Administered 2023-02-18 – 2023-02-28 (×9): 1 [drp] via OPHTHALMIC
  Filled 2023-02-17 (×2): qty 5

## 2023-02-17 MED ORDER — VANCOMYCIN HCL IN DEXTROSE 1-5 GM/200ML-% IV SOLN: 1000.0000 mg | Freq: Once | INTRAVENOUS | Status: DC

## 2023-02-17 MED ORDER — VANCOMYCIN HCL IN DEXTROSE 1-5 GM/200ML-% IV SOLN
1000.0000 mg | INTRAVENOUS | Status: DC
  Filled 2023-02-17: qty 200

## 2023-02-17 MED ORDER — AMITRIPTYLINE HCL 25 MG PO TABS
25.0000 mg | ORAL_TABLET | Freq: Every day | ORAL | Status: DC
  Administered 2023-02-17 – 2023-02-27 (×10): 25 mg via ORAL
  Filled 2023-02-17 (×11): qty 1

## 2023-02-17 MED ORDER — VANCOMYCIN HCL 1750 MG/350ML IV SOLN
1750.0000 mg | Freq: Once | INTRAVENOUS | Status: AC
  Administered 2023-02-17: 1750 mg via INTRAVENOUS
  Filled 2023-02-17: qty 350

## 2023-02-17 MED ORDER — ONDANSETRON HCL 4 MG PO TABS: 4.0000 mg | ORAL_TABLET | Freq: Four times a day (QID) | ORAL | Status: DC | PRN

## 2023-02-17 MED ORDER — ASPIRIN 325 MG PO TBEC
325.0000 mg | DELAYED_RELEASE_TABLET | ORAL | Status: DC
  Administered 2023-02-17 – 2023-02-19 (×2): 325 mg via ORAL
  Filled 2023-02-17 (×3): qty 1

## 2023-02-17 MED ORDER — ACETAMINOPHEN 650 MG RE SUPP: 650.0000 mg | Freq: Four times a day (QID) | RECTAL | Status: DC | PRN

## 2023-02-17 NOTE — Hospital Course (Signed)
72 year old male with a history of diabetes mellitus type 2, hypertension, ESRD (MWF), coronary artery disease presenting with abnormal appearing left foot with some malodorous drainage.  The patient states that he noticed some pain in his left foot about 2 to 3 weeks ago when he went to urgent care for a wound on his left pretibial area.  He did not think much of the left foot at that point and he did not notice any drainage or redness.  He was prescribed clindamycin for a wound on his left pretibial area.  He took the clindamycin for about 2 weeks and the wound on the left pretibial area has improved. However he continued to have some intermittent pain in his left foot.  On 02/16/2023, the patient was drying off his foot when he came out of the shower and noticed a lot of sloughing off of his skin.  After his skin sloughed off he noted that there was some redness and black appearing toes on the left third fourth and fifth toes.  He soaked his left foot and some Clorox bleach for about 30 minutes on the morning of 02/17/2023.  He noticed that his toes were even more black and had some redness going up his left foot.  He went back to urgent care on the morning of 02/17/2023.  They directed him to the emergency department for further evaluation.  The patient adamantly states that he had not noticed any redness or black appearing areas on his left foot prior to yesterday. He denies any fevers, chills, headache, chest pain, short of breath, nausea, vomiting, diarrhea, abdominal pain.  He had dialysis on 02/16/2023.  He has been compliant with his dialysis sessions.  He denies any chest pain or shortness of breath.  He denies any coughing or hemoptysis.  There is no hematochezia or melena.  In the ED, the patient was afebrile hemodynamically stable with oxygen saturation 98% on room air.  WBC 15.2, hemoglobin 13.0, platelets 279,000.  Sodium 130, potassium 4.4, bicarbonate 25, serum creatinine 6.80.  The patient was given  a dose of vancomycin.  Patient will be admitted to [facility] as he will need vascular surgery evaluation and possible amputation.

## 2023-02-17 NOTE — ED Triage Notes (Addendum)
Pt reports toe pain x 1 week. Pt reports the skin "rubbed off my toes." Pts 3rd, 4th, and 5th toe are black. Pts skin sloughed off multiple toes.

## 2023-02-17 NOTE — H&P (Signed)
History and Physical    Patient: [name] ZYS:[id number] DOB: 1950-07-23 DOA: 02/17/2023 DOS: the patient was seen and examined on 02/17/2023 PCP: [facility]  Patient coming from: Home  Chief Complaint:  Chief Complaint  Patient presents with   Toe Problem   HPI: [name] is a 72 year old male with a history of diabetes mellitus type 2, hypertension, ESRD (MWF), coronary artery disease presenting with abnormal appearing left foot with some malodorous drainage.  The patient states that he noticed some pain in his left foot about 2 to 3 weeks ago when he went to urgent care for a wound on his left pretibial area.  He did not think much of the left foot at that point and he did not notice any drainage or redness.  He was prescribed clindamycin for a wound on his left pretibial area.  He took the clindamycin for about 2 weeks and the wound on the left pretibial area has improved. However he continued to have some intermittent pain in his left foot.  On 02/16/2023, the patient was drying off his foot when he came out of the shower and noticed a lot of sloughing off of his skin.  After his skin sloughed off he noted that there was some redness and black appearing toes on the left third fourth and fifth toes.  He soaked his left foot and some Clorox bleach for about 30 minutes on the morning of 02/17/2023.  He noticed that his toes were even more black and had some redness going up his left foot.  He went back to urgent care on the morning of 02/17/2023.  They directed him to the emergency department for further evaluation.  The patient adamantly states that he had not noticed any redness or black appearing areas on his left foot prior to yesterday. He denies any fevers, chills, headache, chest pain, short of breath, nausea, vomiting, diarrhea, abdominal pain.  He had dialysis on 02/16/2023.  He has been compliant with his dialysis sessions.  He denies any chest pain or  shortness of breath.  He denies any coughing or hemoptysis.  There is no hematochezia or melena.  In the ED, the patient was afebrile hemodynamically stable with oxygen saturation 98% on room air.  WBC 15.2, hemoglobin 13.0, platelets 279,000.  Sodium 130, potassium 4.4, bicarbonate 25, serum creatinine 6.80.  The patient was given a dose of vancomycin.  Patient will be admitted to [facility] as he will need vascular surgery evaluation and possible amputation.  Review of Systems: As mentioned in the history of present illness. All other systems reviewed and are negative. Past Medical History:  Diagnosis Date   Arthritis    CAD (coronary artery disease)    STEMI with LAD stent by [facility] in 2005   Dependence on renal dialysis (HCC)    Depression    Diabetes mellitus without complication (HCC)    History of kidney stones    Hypertension    Kidney infection    MI (mitral incompetence)    Myocardial infarction (HCC)    Seasonal allergies    Past Surgical History:  Procedure Laterality Date   AV FISTULA PLACEMENT Left 01/30/2014   Procedure: INSERTION OF ARTERIOVENOUS (AV) GORE-TEX GRAFT LEFT UPPER ARM;  Surgeon: Sherren Kerns, MD;  Location: [facility] OR;  Service: Vascular;  Laterality: Left;   BACK SURGERY     CARDIAC CATHETERIZATION N/A 04/01/2015   Procedure: Right/Left Heart Cath and Coronary Angiography;  Surgeon: Marykay Lex, MD;  Location: [facility] INVASIVE CV LAB;  Service: Cardiovascular;  Laterality: N/A;   COLONOSCOPY N/A 10/12/2016   Procedure: COLONOSCOPY;  Surgeon: Corbin Ade, MD;  Location: [facility] ENDO SUITE;  Service: Endoscopy;  Laterality: N/A;  2:00pm   COLONOSCOPY W/ POLYPECTOMY     CORONARY ANGIOPLASTY WITH STENT PLACEMENT  2008   CORONARY ARTERY BYPASS GRAFT N/A 04/03/2015   Procedure: CORONARY ARTERY BYPASS GRAFTING (CABG) x  4 (LIMA to LAD, SVG to DIAGONAL, SVG to OM1, and SVG to RCA) with EVH from right greater saphenous thigh and partial lower leg vein ;  Surgeon:  Kerin Perna, MD;  Location: [facility] OR;  Service: Open Heart Surgery;  Laterality: N/A;   EYE SURGERY Right    lens implant   INSERTION OF DIALYSIS CATHETER Right 01/30/2014   Procedure: INSERTION OF DIALYSIS CATHETER-RIGHT INTERNAL JUGULAR;  Surgeon: Sherren Kerns, MD;  Location: [facility] OR;  Service: Vascular;  Laterality: Right;   IR RADIOLOGIST EVAL & MGMT  12/30/2020   IR RADIOLOGIST EVAL & MGMT  01/08/2021   IR THORACENTESIS ASP PLEURAL SPACE W/IMG GUIDE  03/26/2022   POLYPECTOMY  10/12/2016   Procedure: POLYPECTOMY;  Surgeon: Corbin Ade, MD;  Location: [facility] ENDO SUITE;  Service: Endoscopy;;  colon   SPINE SURGERY     TEE WITHOUT CARDIOVERSION N/A 04/03/2015   Procedure: TRANSESOPHAGEAL ECHOCARDIOGRAM (TEE);  Surgeon: Kerin Perna, MD;  Location: [facility] OR;  Service: Open Heart Surgery;  Laterality: N/A;   Social History:  reports that he has never smoked. He quit smokeless tobacco use about 12 years ago.  His smokeless tobacco use included chew. He reports that he does not drink alcohol and does not use drugs.  Allergies  Allergen Reactions   Shellfish Allergy Hives    Family History  Problem Relation Age of Onset   Diabetes Mother    Hypertension Mother    Diabetes Father    Hypertension Father    Diabetes Other     Prior to Admission medications   Medication Sig Start Date End Date Taking? Authorizing Provider  aspirin EC 325 MG tablet Take 325mg s once daily on non dialysis days. Dialysis days are Mon, Wed, Fri   Yes [provider]  atorvastatin (LIPITOR) 80 MG tablet Take 1 tablet (80 mg total) by mouth daily at 6 PM. 04/10/15  Yes Doree Fudge M, PA-C  insulin glargine (LANTUS) 100 UNIT/ML injection Inject 10-15 Units into the skin at bedtime. Sliding scale over 200   Yes [provider]  minoxidil (LONITEN) 10 MG tablet Take 0.5 tablets (5 mg total) by mouth at bedtime. Patient does not take as prescribed. Sometimes takes a 1/2 tablet depending on BP  readings Patient taking differently: Take 5-10 mg by mouth at bedtime. Patient does not take as prescribed. Sometimes takes a 1/2 tablet depending on BP readings 02/01/21  Yes Emokpae, Courage, MD  acetaminophen (TYLENOL) 500 MG tablet Take 325 mg by mouth every 6 (six) hours as needed for mild pain or moderate pain.    [provider]  amitriptyline (ELAVIL) 25 MG tablet Take 25 mg by mouth at bedtime. 01/21/21   [provider]  carvedilol (COREG) 25 MG tablet Take 1 tablet (25 mg total) by mouth 2 (two) times daily. 02/01/21   Shon Hale, MD  cloNIDine (CATAPRES) 0.2 MG tablet Take 1 tablet (0.2 mg total) by mouth 2 (two) times daily. 02/01/21   Shon Hale, MD  diphenhydramine-acetaminophen (  TYLENOL PM) 25-500 MG TABS tablet Take 1 tablet by mouth at bedtime as needed (sleep).    [provider]  hydrALAZINE (APRESOLINE) 50 MG tablet Take 1 tablet (50 mg total) by mouth 3 (three) times daily. Patient taking differently: Take 50 mg by mouth daily. Per pt 02/01/21   Shon Hale, MD  latanoprost (XALATAN) 0.005 % ophthalmic solution Place 2 drops into both eyes 2 (two) times daily. 08/27/16   [provider]  Multiple Vitamin (DAILY VITAMIN PO) Take 1 tablet by mouth daily as needed (supplement).    [provider]  Omega-3 Fatty Acids (FISH OIL) 1000 MG CAPS Take 1,000 mg by mouth daily.    [provider]  OVER THE COUNTER MEDICATION Take 1 tablet by mouth daily. Beet Root    [provider]  sevelamer carbonate (RENVELA) 800 MG tablet 1 tablet with meals    [provider]    Physical Exam: Vitals:   02/17/23 1224  BP: (!) 148/82  Pulse: 85  Resp: 15  Temp: 98 F (36.7 C)  TempSrc: Oral  SpO2: 98%   GENERAL:  A&O x 3, NAD, well developed, cooperative, follows commands HEENT: [place]/AT, No thrush, No icterus, No oral ulcers Neck:  No neck mass, No meningismus, soft, supple CV: RRR, no S3, no S4, no rub, no  JVD Lungs:  CTA, no wheeze, no rhonchi, good air movement Abd: soft/NT +BS, nondistended Ext: + left foot edema, +dry gangrene of left second to 5 toes with decreased sensation.  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 Karin Lieu -continue vancomycin -add ceftriaxone -check ABI, but will likely need angiogram -concerned he will likely need operative intervention -if amputation is not indicated, he will need MR of left foot -ESR -CRP  ESRD -dialysis on MWF, last done 02/16/23 -nephrology notified for maintenance HD  Diabetes mellitus type 2 with hyperglycemia -Check hemoglobin A1c -NovoLog sliding scale -Reduced dose Semglee  Essential hypertension -Continue home dose verapamil, hydralazine -He takes minoxidil as needed  Coronary artery disease -No chest pain presently -Continue Lipitor and aspirin      Advance Care Planning: FULL  Consults: vascular surgery--Dr. [name]; renal--[place]  Family Communication: none  Severity of Illness: The appropriate patient status for this patient is INPATIENT. Inpatient status is judged to be reasonable and necessary in order to provide the required intensity of service to ensure the patient's safety. The patient's presenting symptoms, physical exam findings, and initial radiographic and laboratory data in the context of their chronic comorbidities is felt to place them at high risk for further clinical deterioration. Furthermore, it is not anticipated that the patient will be medically stable for discharge from the hospital within 2 midnights of admission.   * I certify that at  the point of admission it is my clinical judgment that the patient will require inpatient hospital care spanning beyond 2 midnights from the point of admission due to high intensity of service, high risk for further deterioration and high frequency of surveillance required.*  Author: Catarina Hartshorn, MD 02/17/2023 2:47 PM  For on call review [email or web address].

## 2023-02-17 NOTE — ED Provider Notes (Signed)
[place] EMERGENCY DEPARTMENT AT [facility] Provider Note   CSN: [id number] Arrival date & time: 02/17/23  1205     History  Chief Complaint  Patient presents with   Toe Problem    [name] is a 72 y.o. male.  HPI   72 year old male, he is a diabetic who has high cholesterol and hypertension, he presents to the hospital with a complaint of left abnormal-appearing foot.  He states that over the last few days he has had some increasing discomfort and when he was wiping his foot down yesterday the skin came off of his toes and they appear to be black and red.  He does not have any significant pain but states there has been a foul smell and there is now some discharge and drainage.  No fevers or chills, no nausea or vomiting, no prior amputation of any toes  Home Medications Prior to Admission medications   Medication Sig Start Date End Date Taking? Authorizing Provider  acetaminophen (TYLENOL) 500 MG tablet Take 325 mg by mouth every 6 (six) hours as needed for mild pain or moderate pain.    [provider]  amitriptyline (ELAVIL) 25 MG tablet Take 25 mg by mouth at bedtime. 01/21/21   [provider]  aspirin EC 325 MG tablet Take 325mg s once daily on non dialysis days. Dialysis days are Mon, Wed, Fri    [provider]  atorvastatin (LIPITOR) 80 MG tablet Take 1 tablet (80 mg total) by mouth daily at 6 PM. Patient not taking: Reported on 07/20/2022 04/10/15   Ardelle Balls, PA-C  carvedilol (COREG) 25 MG tablet Take 1 tablet (25 mg total) by mouth 2 (two) times daily. 02/01/21   Shon Hale, MD  cloNIDine (CATAPRES) 0.2 MG tablet Take 1 tablet (0.2 mg total) by mouth 2 (two) times daily. 02/01/21   Shon Hale, MD  diphenhydramine-acetaminophen (TYLENOL PM) 25-500 MG TABS tablet Take 1 tablet by mouth at bedtime as needed (sleep).    [provider]  hydrALAZINE (APRESOLINE) 50 MG tablet Take 1 tablet (50 mg total) by  mouth 3 (three) times daily. 02/01/21   Shon Hale, MD  insulin glargine (LANTUS) 100 UNIT/ML injection Inject 10-15 Units into the skin at bedtime. Sliding scale over 200    [provider]  latanoprost (XALATAN) 0.005 % ophthalmic solution Place 2 drops into both eyes 2 (two) times daily. 08/27/16   [provider]  minoxidil (LONITEN) 10 MG tablet Take 0.5 tablets (5 mg total) by mouth at bedtime. Patient does not take as prescribed. Sometimes takes a 1/2 tablet depending on BP readings 02/01/21   Shon Hale, MD  Multiple Vitamin (DAILY VITAMIN PO) Take 1 tablet by mouth daily as needed (supplement).    [provider]  Omega-3 Fatty Acids (FISH OIL) 1000 MG CAPS Take 1,000 mg by mouth daily.    [provider]  OVER THE COUNTER MEDICATION Take 1 tablet by mouth daily. Beet Root    [provider]  sevelamer carbonate (RENVELA) 800 MG tablet 1 tablet with meals    [provider]      Allergies    Shellfish allergy    Review of Systems   Review of Systems  All other systems reviewed and are negative.   Physical Exam Updated Vital Signs BP (!) 148/82 (BP Location: Right Arm)   Pulse 85   Temp 98 F (36.7 C) (Oral)   Resp 15   SpO2 98%  Physical Exam Vitals and nursing note reviewed.  Constitutional:      General: He is not in acute distress.    Appearance: He is well-developed.  HENT:     Head: Normocephalic and atraumatic.     Mouth/Throat:     Pharynx: No oropharyngeal exudate.  Eyes:     General: No scleral icterus.       Right eye: No discharge.        Left eye: No discharge.     Conjunctiva/sclera: Conjunctivae normal.     Pupils: Pupils are equal, round, and reactive to light.  Neck:     Thyroid: No thyromegaly.     Vascular: No JVD.  Cardiovascular:     Rate and Rhythm: Normal rate and regular rhythm.     Heart sounds: Normal heart sounds. No murmur heard.    No friction rub. No gallop.  Pulmonary:      Effort: Pulmonary effort is normal. No respiratory distress.     Breath sounds: Normal breath sounds. No wheezing or rales.  Abdominal:     General: Bowel sounds are normal. There is no distension.     Palpations: Abdomen is soft. There is no mass.     Tenderness: There is no abdominal tenderness.  Musculoskeletal:        General: No tenderness.     Cervical back: Normal range of motion and neck supple.     Right lower leg: No edema.     Left lower leg: No edema.     Comments: The patient has bilateral warm feet, pulses are minimal, capillary refill appears normal in the big toes however on the left foot there is gangrenous black toes #234 and 5.  There is some missing skin on these toes there is some foul drainage in between the toes and on the top.  There is some swelling of these toes as well.  The foot itself is warm, there is a slight redness to that foot.  Lymphadenopathy:     Cervical: No cervical adenopathy.  Skin:    General: Skin is warm and dry.     Findings: No erythema or rash.  Neurological:     Mental Status: He is alert.     Coordination: Coordination normal.  Psychiatric:        Behavior: Behavior normal.     ED Results / Procedures / Treatments   Labs (all labs ordered are listed, but only abnormal results are displayed) Labs Reviewed  CBC WITH DIFFERENTIAL/PLATELET  BASIC METABOLIC PANEL  CBG MONITORING, ED    EKG None  Radiology No results found.  Procedures Procedures    Medications Ordered in ED Medications  vancomycin (VANCOCIN) IVPB 1000 mg/200 mL premix (has no administration in time range)    ED Course/ Medical Decision Making/ A&P Clinical Course as of 02/20/23 1219  Thu Feb 17, 2023  1322 Antibiotics ordered lab work shows a leukocytosis and mild hyponatremia and a chronic renal failure.  The patient is on dialysis and has a fistula in the left upper extremity with a good thrill.  Vital signs otherwise have been unremarkable,  thankfully he is not tachycardic or febrile [BM]    Clinical Course User Index [BM] Eber Hong, MD                                 Medical Decision Making Amount and/or Complexity of Data Reviewed Labs: ordered.  Radiology: ordered.  Risk Prescription drug management. Decision regarding hospitalization.    This patient presents to the ED for concern of toe abnormalities, this involves an extensive number of treatment options, and is a complaint that carries with it a high risk of complications and morbidity.  The differential diagnosis includes gangrenous toes, osteomyelitis, vascular restriction, cellulitis or abscess Had been seen at urgent care prior to his visit here Recent completed clindamycin for a poorly healing wound infection of his mid left lower extremity of the tibia   Co morbidities that complicate the patient evaluation  Patient is not ill-appearing and has vital signs which are reassuring but has a foot that appears to be gangrenous and progressive infection Type I diabetic   Additional history obtained:  Additional history obtained from medical record External records from outside source obtained and reviewed including prior urgent care notes, office visit notes   Lab Tests:  I Ordered, and personally interpreted labs.  The pertinent results include: Lab work showed a leukocytosis of 15,000, chronic renal failure present, x-ray shows gangrene and no findings of osteomyelitis   Imaging Studies ordered:  I ordered imaging studies including x-ray of the foot I independently visualized and interpreted imaging which showed gangrene but no osteomyelitis I agree with the radiologist interpretation   Cardiac Monitoring: / EKG:  The patient was maintained on a cardiac monitor.  I personally viewed and interpreted the cardiac monitored which showed an underlying rhythm of: Normal sinus rhythm   Consultations Obtained:  I requested consultation with the  hospitalist,  and discussed lab and imaging findings as well as pertinent plan - they recommend: Antibiotics and admission to the hospital   Problem List / ED Course / Critical interventions / Medication management  Patient appears hemodynamically stable but signs of infection in the foot, will need to be admitted for antibiotics and likely need surgical consultation I have reviewed the patients home medicines and have made adjustments as needed   Social Determinants of Health:  None   Test / Admission - Considered:  Will admit         Final Clinical Impression(s) / ED Diagnoses Final diagnoses:  None    Rx / DC Orders ED Discharge Orders     None         Eber Hong, MD 02/20/23 1221

## 2023-02-17 NOTE — Progress Notes (Signed)
Pharmacy Antibiotic Note  [name] is a 72 y.o. male admitted on 02/17/2023 with  diabetic foot infection w/ gangrene .  Patient is ESRD w/ MWF dialysis schedule. Pharmacy has been consulted for vancomycin dosing.  Plan: Vancomycin 1750 mg IV x 1 dose. Vancomycin 1000 mg IV every MWF w/ HD Monitor labs, c/s, and vanco level as indicated.  Height: 6\' 5"  (195.6 cm) Weight: 86.2 kg (190 lb) IBW/kg (Calculated) : 89.1  Temp (24hrs), Avg:98 F (36.7 C), Min:98 F (36.7 C), Max:98 F (36.7 C)  Recent Labs  Lab 02/17/23 1253  WBC 15.2*  CREATININE 6.80*    Estimated Creatinine Clearance: 12.1 mL/min (A) (by C-G formula based on SCr of 6.8 mg/dL (H)).    Allergies  Allergen Reactions   Shellfish Allergy Hives    Antimicrobials this admission: Vanco 9/5 >> CTX 9/5 >>  Microbiology results: None pending  Thank you for allowing pharmacy to be a part of this patient's care.  Judeth Cornfield, PharmD Clinical Pharmacist 02/17/2023 3:34 PM

## 2023-02-18 ENCOUNTER — Inpatient Hospital Stay (HOSPITAL_COMMUNITY): Payer: Medicare Other

## 2023-02-18 DIAGNOSIS — E11628 Type 2 diabetes mellitus with other skin complications: Secondary | ICD-10-CM | POA: Diagnosis not present

## 2023-02-18 DIAGNOSIS — I96 Gangrene, not elsewhere classified: Secondary | ICD-10-CM | POA: Diagnosis not present

## 2023-02-18 DIAGNOSIS — E871 Hypo-osmolality and hyponatremia: Secondary | ICD-10-CM | POA: Diagnosis not present

## 2023-02-18 DIAGNOSIS — N186 End stage renal disease: Secondary | ICD-10-CM | POA: Diagnosis not present

## 2023-02-18 DIAGNOSIS — Z992 Dependence on renal dialysis: Secondary | ICD-10-CM

## 2023-02-18 DIAGNOSIS — I70262 Atherosclerosis of native arteries of extremities with gangrene, left leg: Secondary | ICD-10-CM | POA: Diagnosis not present

## 2023-02-18 LAB — CBC
HCT: 40.4 % (ref 39.0–52.0)
Hemoglobin: 12.7 g/dL — ABNORMAL LOW (ref 13.0–17.0)
MCH: 30.3 pg (ref 26.0–34.0)
MCHC: 31.4 g/dL (ref 30.0–36.0)
MCV: 96.4 fL (ref 80.0–100.0)
Platelets: 254 10*3/uL (ref 150–400)
RBC: 4.19 MIL/uL — ABNORMAL LOW (ref 4.22–5.81)
RDW: 17 % — ABNORMAL HIGH (ref 11.5–15.5)
WBC: 11.6 10*3/uL — ABNORMAL HIGH (ref 4.0–10.5)
nRBC: 0 % (ref 0.0–0.2)

## 2023-02-18 LAB — GLUCOSE, CAPILLARY
Glucose-Capillary: 143 mg/dL — ABNORMAL HIGH (ref 70–99)
Glucose-Capillary: 94 mg/dL (ref 70–99)
Glucose-Capillary: 236 mg/dL — ABNORMAL HIGH (ref 70–99)

## 2023-02-18 LAB — RENAL FUNCTION PANEL
Albumin: 2.3 g/dL — ABNORMAL LOW (ref 3.5–5.0)
Anion gap: 18 — ABNORMAL HIGH (ref 5–15)
BUN: 63 mg/dL — ABNORMAL HIGH (ref 8–23)
CO2: 25 mmol/L (ref 22–32)
Calcium: 8.8 mg/dL — ABNORMAL LOW (ref 8.9–10.3)
Chloride: 89 mmol/L — ABNORMAL LOW (ref 98–111)
Creatinine, Ser: 7.78 mg/dL — ABNORMAL HIGH (ref 0.61–1.24)
GFR, Estimated: 7 mL/min — ABNORMAL LOW (ref >60)
Glucose, Bld: 153 mg/dL — ABNORMAL HIGH (ref 70–99)
Phosphorus: 7.7 mg/dL — ABNORMAL HIGH (ref 2.5–4.6)
Potassium: 5.1 mmol/L (ref 3.5–5.1)
Sodium: 132 mmol/L — ABNORMAL LOW (ref 135–145)

## 2023-02-18 LAB — HEPATITIS B SURFACE ANTIGEN: Hepatitis B Surface Ag: NONREACTIVE

## 2023-02-18 MED ORDER — INSULIN ASPART 100 UNIT/ML IJ SOLN
0.0000 [IU] | Freq: Three times a day (TID) | INTRAMUSCULAR | Status: DC
  Administered 2023-02-18: 1 [IU] via SUBCUTANEOUS
  Administered 2023-02-19: 2 [IU] via SUBCUTANEOUS
  Administered 2023-02-20: 7 [IU] via SUBCUTANEOUS
  Administered 2023-02-20: 5 [IU] via SUBCUTANEOUS
  Administered 2023-02-20 – 2023-02-22 (×2): 2 [IU] via SUBCUTANEOUS
  Administered 2023-02-23: 3 [IU] via SUBCUTANEOUS
  Administered 2023-02-23: 5 [IU] via SUBCUTANEOUS
  Administered 2023-02-24: 1 [IU] via SUBCUTANEOUS
  Administered 2023-02-24 – 2023-02-25 (×2): 3 [IU] via SUBCUTANEOUS

## 2023-02-18 MED ORDER — HEPARIN SODIUM (PORCINE) 1000 UNIT/ML IJ SOLN
INTRAMUSCULAR | Status: AC
  Filled 2023-02-18: qty 2

## 2023-02-18 MED ORDER — CHLORHEXIDINE GLUCONATE CLOTH 2 % EX PADS
6.0000 | MEDICATED_PAD | Freq: Every day | CUTANEOUS | Status: DC
  Administered 2023-02-19 – 2023-02-22 (×4): 6 via TOPICAL

## 2023-02-18 MED ORDER — SEVELAMER CARBONATE 800 MG PO TABS
800.0000 mg | ORAL_TABLET | Freq: Three times a day (TID) | ORAL | Status: DC
  Administered 2023-02-18 – 2023-02-26 (×19): 800 mg via ORAL
  Filled 2023-02-18 (×19): qty 1

## 2023-02-18 NOTE — Plan of Care (Signed)
  Problem: Health Behavior/Discharge Planning: Goal: Ability to manage health-related needs will improve Outcome: Progressing   Problem: Education: Goal: Knowledge of General Education information will improve Description: Including pain rating scale, medication(s)/side effects and non-pharmacologic comfort measures Outcome: Progressing   Problem: Clinical Measurements: Goal: Will remain free from infection Outcome: Progressing   

## 2023-02-18 NOTE — Progress Notes (Addendum)
Report called to HD and BP medications held at this time. Pt is aware HD is pending. CBG and vitals updated in [facility].   Pt is still waiting for transport for HD. BP medication not given. NAD at this time.  Pt is back from HD and tolerated well. Pt c/o of constipation and request warm coffee. No orders for prn constipation.   Pt HR noted at 120's now HR is 93

## 2023-02-18 NOTE — Consult Note (Addendum)
Hospital Consult    Reason for Consult:  left foot gangrene Requesting Physician:  Maretta Bees MD MRN #:  [id number]  History of Present Illness: [name] is a 72 y.o. male with a past medical history of DMII, HTN, ESRD on hemodialysis MWF, and CAD s/p CABG who presented to the hospital yesterday with left foot gangrene.  He noticed some pain in his left foot a couple of weeks ago that has progressed with time.  He states he did not notice the appearance of his foot until 2 days ago when drying his foot after getting out of the shower.  He noticed a lot of skin sloughing when rubbing his foot with a towel.  Yesterday morning he soaked his left foot in Clorox bleach for about 30 minutes, then he went to urgent care and was transferred to the ED for further evaluation.  He has no prior vascular interventions on his lower extremities.  He denies any fever or chills at home.  He states he has a hard time seeing his foot due to diabetic eye changes.  Past Medical History:  Diagnosis Date   Arthritis    CAD (coronary artery disease)    STEMI with LAD stent by C Granger in 2005   Dependence on renal dialysis (HCC)    Depression    Diabetes mellitus without complication (HCC)    History of kidney stones    Hypertension    Kidney infection    MI (mitral incompetence)    Myocardial infarction (HCC)    Seasonal allergies     Past Surgical History:  Procedure Laterality Date   AV FISTULA PLACEMENT Left 01/30/2014   Procedure: INSERTION OF ARTERIOVENOUS (AV) GORE-TEX GRAFT LEFT UPPER ARM;  Surgeon: Sherren Kerns, MD;  Location: [facility] OR;  Service: Vascular;  Laterality: Left;   BACK SURGERY     CARDIAC CATHETERIZATION N/A 04/01/2015   Procedure: Right/Left Heart Cath and Coronary Angiography;  Surgeon: Marykay Lex, MD;  Location: [facility] INVASIVE CV LAB;  Service: Cardiovascular;  Laterality: N/A;   COLONOSCOPY N/A 10/12/2016   Procedure: COLONOSCOPY;  Surgeon: Corbin Ade, MD;   Location: [facility] SUITE;  Service: Endoscopy;  Laterality: N/A;  2:00pm   COLONOSCOPY W/ POLYPECTOMY     CORONARY ANGIOPLASTY WITH STENT PLACEMENT  2008   CORONARY ARTERY BYPASS GRAFT N/A 04/03/2015   Procedure: CORONARY ARTERY BYPASS GRAFTING (CABG) x  4 (LIMA to LAD, SVG to DIAGONAL, SVG to OM1, and SVG to RCA) with EVH from right greater saphenous thigh and partial lower leg vein ;  Surgeon: Kerin Perna, MD;  Location: [facility] OR;  Service: Open Heart Surgery;  Laterality: N/A;   EYE SURGERY Right    lens implant   INSERTION OF DIALYSIS CATHETER Right 01/30/2014   Procedure: INSERTION OF DIALYSIS CATHETER-RIGHT INTERNAL JUGULAR;  Surgeon: Sherren Kerns, MD;  Location: [facility] OR;  Service: Vascular;  Laterality: Right;   IR RADIOLOGIST EVAL & MGMT  12/30/2020   IR RADIOLOGIST EVAL & MGMT  01/08/2021   IR THORACENTESIS ASP PLEURAL SPACE W/IMG GUIDE  03/26/2022   POLYPECTOMY  10/12/2016   Procedure: POLYPECTOMY;  Surgeon: Corbin Ade, MD;  Location: [facility];  Service: Endoscopy;;  colon   SPINE SURGERY     TEE WITHOUT CARDIOVERSION N/A 04/03/2015   Procedure: TRANSESOPHAGEAL ECHOCARDIOGRAM (TEE);  Surgeon: Kerin Perna, MD;  Location: [facility] OR;  Service: Open Heart Surgery;  Laterality: N/A;    Allergies  Allergen Reactions   Shellfish Allergy Hives    Prior to Admission medications   Medication Sig Start Date End Date Taking? Authorizing Provider  acetaminophen (TYLENOL) 500 MG tablet Take 325 mg by mouth every 6 (six) hours as needed for mild pain or moderate pain.   Yes [provider]  amitriptyline (ELAVIL) 25 MG tablet Take 25 mg by mouth at bedtime. 01/21/21  Yes [provider]  aspirin EC 325 MG tablet Take 325mg s once daily on non dialysis days. Dialysis days are Mon, Wed, Fri   Yes [provider]  atorvastatin (LIPITOR) 80 MG tablet Take 1 tablet (80 mg total) by mouth daily at 6 PM. 04/10/15  Yes Doree Fudge M, PA-C  brimonidine  (ALPHAGAN) 0.2 % ophthalmic solution Place 1 drop into both eyes in the morning. 03/11/20  Yes [provider]  hydrALAZINE (APRESOLINE) 50 MG tablet Take 1 tablet (50 mg total) by mouth 3 (three) times daily. 02/01/21  Yes Emokpae, Courage, MD  insulin glargine (LANTUS) 100 UNIT/ML injection Inject 10-15 Units into the skin at bedtime. Sliding scale over 200   Yes [provider]  latanoprost (XALATAN) 0.005 % ophthalmic solution Place 2 drops into both eyes 2 (two) times daily. 08/27/16  Yes [provider]  minoxidil (LONITEN) 10 MG tablet Take 0.5 tablets (5 mg total) by mouth at bedtime. Patient does not take as prescribed. Sometimes takes a 1/2 tablet depending on BP readings Patient taking differently: Take 5-10 mg by mouth at bedtime. Patient does not take as prescribed. Sometimes takes a 1/2 tablet depending on BP readings 02/01/21  Yes Emokpae, Courage, MD  Multiple Vitamin (DAILY VITAMIN PO) Take 1 tablet by mouth daily as needed (supplement).   Yes [provider]  Omega-3 Fatty Acids (FISH OIL) 1000 MG CAPS Take 1,000 mg by mouth daily.   Yes [provider]  OVER THE COUNTER MEDICATION Take 1 tablet by mouth daily. Beet Root   Yes [provider]  verapamil (CALAN-SR) 120 MG CR tablet Take 120 mg by mouth See admin instructions. Take 1 tablet in the morning on the days that you do not have Dialysis. Do Not take any on the days you have Dialysis.   Yes [provider]    Social History   Socioeconomic History   Marital status: Widowed    Spouse name: Not on file   Number of children: Not on file   Years of education: Not on file   Highest education level: Not on file  Occupational History   Not on file  Tobacco Use   Smoking status: Never   Smokeless tobacco: Former    Types: Chew    Quit date: 06/14/2010  Vaping Use   Vaping status: Never Used  Substance and Sexual Activity   Alcohol use: No   Drug use: No   Sexual  activity: Not on file  Other Topics Concern   Not on file  Social History Narrative   Not on file   Social Determinants of Health   Financial Resource Strain: Not on file  Food Insecurity: No Food Insecurity (02/17/2023)   Hunger Vital Sign    Worried About Running Out of Food in the Last Year: Never true    Ran Out of Food in the Last Year: Never true  Transportation Needs: No Transportation Needs (02/17/2023)   PRAPARE - Administrator, Civil Service (Medical): No    Lack of Transportation (Non-Medical): No  Physical  Activity: Not on file  Stress: Not on file  Social Connections: Not on file  Intimate Partner Violence: Not At Risk (02/17/2023)   Humiliation, Afraid, Rape, and Kick questionnaire    Fear of Current or Ex-Partner: No    Emotionally Abused: No    Physically Abused: No    Sexually Abused: No     Family History  Problem Relation Age of Onset   Diabetes Mother    Hypertension Mother    Diabetes Father    Hypertension Father    Diabetes Other     ROS: Otherwise negative unless mentioned in HPI  Physical Examination  Vitals:   02/18/23 0000 02/18/23 0500  BP: 120/66 118/63  Pulse: 97 94  Resp: 20 20  Temp: 97.8 F (36.6 C) 97.8 F (36.6 C)  SpO2:     Body mass index is 22.53 kg/m.  General:  WDWN in NAD Gait: Not observed HENT: WNL, normocephalic Pulmonary: normal non-labored breathing, without Rales, rhonchi,  wheezing Cardiac: regular Abdomen:  soft, NT/ND, no masses Skin: without rashes Vascular Exam/Pulses: palpable femoral pulses bilaterally. Nonpalpable pedal pulses on the left Extremities: dry gangrene and sloughing of the left 2nd-5th toes Musculoskeletal: no muscle wasting or atrophy  Neurologic: A&O X 3;  No focal weakness or paresthesias are detected; speech is fluent/normal Psychiatric:  The pt has Normal affect. Lymph:  Unremarkable      CBC    Component Value Date/Time   WBC 11.6 (H) 02/18/2023 0534   RBC 4.19  (L) 02/18/2023 0534   HGB 12.7 (L) 02/18/2023 0534   HCT 40.4 02/18/2023 0534   PLT 254 02/18/2023 0534   MCV 96.4 02/18/2023 0534   MCH 30.3 02/18/2023 0534   MCHC 31.4 02/18/2023 0534   RDW 17.0 (H) 02/18/2023 0534   LYMPHSABS 0.8 02/17/2023 1253   MONOABS 1.0 02/17/2023 1253   EOSABS 0.1 02/17/2023 1253   BASOSABS 0.0 02/17/2023 1253    BMET    Component Value Date/Time   NA 132 (L) 02/18/2023 0534   K 5.1 02/18/2023 0534   CL 89 (L) 02/18/2023 0534   CO2 25 02/18/2023 0534   GLUCOSE 153 (H) 02/18/2023 0534   BUN 63 (H) 02/18/2023 0534   CREATININE 7.78 (H) 02/18/2023 0534   CALCIUM 8.8 (L) 02/18/2023 0534   CALCIUM 8.3 (L) 01/14/2014 1708   GFRNONAA 7 (L) 02/18/2023 0534   GFRAA 10 (L) 07/29/2018 0024    COAGS: Lab Results  Component Value Date   INR 1.5 (H) 12/12/2020   INR 1.4 (H) 12/11/2020   INR 0.98 06/14/2016     Non-Invasive Vascular Imaging:   ABIs pending  Xray L foot: Negative for osteomyelitis  Statin:  Yes.   Beta Blocker:  No. Aspirin:  Yes.   ACEI:  No. ARB:  No. CCB use:  Yes Other antiplatelets/anticoagulants:  No.    ASSESSMENT/PLAN: This is a 72 y.o. male with dry gangrene of the left 2nd-5th toes   -The patient has a several week history of left foot pain.  Upon evaluation in the ED yesterday it was noted he has left 2nd through 5th toes dry gangrene with slough. He did not notice this appearance of his foot until yesterday after getting out of the shower -He denies any fevers or chills at home.  He denies noticing any drainage from his toes. -On exam he has extensive dry gangrene of the left 2nd through 5th toes.  He has palpable femoral pulses.  His pedal pulses  in the left are nonpalpable -X-ray is negative for osteomyelitis in the left foot -He understands he will require angiogram with possible future revascularization in hopes to save his leg and possibly some of his foot.  He will likely require at least a TMA on the left -We  will plan for abdominal aortogram with left lower extremity runoff with possible intervention next week, likely on Tuesday. The patient is agreeable to this. In the meantime the patient will require orthopedic involvement. His left foot should be kept dry.   Loel Dubonnet PA-C [facility] [phone]   VASCULAR STAFF ADDENDUM: I have independently interviewed and examined the patient. I agree with the above.  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 Dry gangrene. 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  Will likely require TMA at a minimum however possible higher depending on angiogram findings.  Will book for Tuesday Will consult Orthopedic surgery pending findings.   Victorino Sparrow MD [facility] Phone Number: [phone] 02/18/2023 8:15 AM

## 2023-02-18 NOTE — H&P (View-Only) (Signed)
Hospital Consult    Reason for Consult:  left foot gangrene Requesting Physician:  Maretta Bees MD MRN #:  [id number]  History of Present Illness: [name] is a 72 y.o. male with a past medical history of DMII, HTN, ESRD on hemodialysis MWF, and CAD s/p CABG who presented to the hospital yesterday with left foot gangrene.  He noticed some pain in his left foot a couple of weeks ago that has progressed with time.  He states he did not notice the appearance of his foot until 2 days ago when drying his foot after getting out of the shower.  He noticed a lot of skin sloughing when rubbing his foot with a towel.  Yesterday morning he soaked his left foot in Clorox bleach for about 30 minutes, then he went to urgent care and was transferred to the ED for further evaluation.  He has no prior vascular interventions on his lower extremities.  He denies any fever or chills at home.  He states he has a hard time seeing his foot due to diabetic eye changes.  Past Medical History:  Diagnosis Date   Arthritis    CAD (coronary artery disease)    STEMI with LAD stent by C Granger in 2005   Dependence on renal dialysis (HCC)    Depression    Diabetes mellitus without complication (HCC)    History of kidney stones    Hypertension    Kidney infection    MI (mitral incompetence)    Myocardial infarction (HCC)    Seasonal allergies     Past Surgical History:  Procedure Laterality Date   AV FISTULA PLACEMENT Left 01/30/2014   Procedure: INSERTION OF ARTERIOVENOUS (AV) GORE-TEX GRAFT LEFT UPPER ARM;  Surgeon: Sherren Kerns, MD;  Location: [facility] OR;  Service: Vascular;  Laterality: Left;   BACK SURGERY     CARDIAC CATHETERIZATION N/A 04/01/2015   Procedure: Right/Left Heart Cath and Coronary Angiography;  Surgeon: Marykay Lex, MD;  Location: [facility] INVASIVE CV LAB;  Service: Cardiovascular;  Laterality: N/A;   COLONOSCOPY N/A 10/12/2016   Procedure: COLONOSCOPY;  Surgeon: Corbin Ade, MD;   Location: [facility];  Service: Endoscopy;  Laterality: N/A;  2:00pm   COLONOSCOPY W/ POLYPECTOMY     CORONARY ANGIOPLASTY WITH STENT PLACEMENT  2008   CORONARY ARTERY BYPASS GRAFT N/A 04/03/2015   Procedure: CORONARY ARTERY BYPASS GRAFTING (CABG) x  4 (LIMA to LAD, SVG to DIAGONAL, SVG to OM1, and SVG to RCA) with EVH from right greater saphenous thigh and partial lower leg vein ;  Surgeon: Kerin Perna, MD;  Location: [facility] OR;  Service: Open Heart Surgery;  Laterality: N/A;   EYE SURGERY Right    lens implant   INSERTION OF DIALYSIS CATHETER Right 01/30/2014   Procedure: INSERTION OF DIALYSIS CATHETER-RIGHT INTERNAL JUGULAR;  Surgeon: Sherren Kerns, MD;  Location: [facility] OR;  Service: Vascular;  Laterality: Right;   IR RADIOLOGIST EVAL & MGMT  12/30/2020   IR RADIOLOGIST EVAL & MGMT  01/08/2021   IR THORACENTESIS ASP PLEURAL SPACE W/IMG GUIDE  03/26/2022   POLYPECTOMY  10/12/2016   Procedure: POLYPECTOMY;  Surgeon: Corbin Ade, MD;  Location: [facility];  Service: Endoscopy;;  colon   SPINE SURGERY     TEE WITHOUT CARDIOVERSION N/A 04/03/2015   Procedure: TRANSESOPHAGEAL ECHOCARDIOGRAM (TEE);  Surgeon: Kerin Perna, MD;  Location: [facility] OR;  Service: Open Heart Surgery;  Laterality: N/A;    Allergies  Allergen Reactions   Shellfish Allergy Hives    Prior to Admission medications   Medication Sig Start Date End Date Taking? Authorizing Provider  acetaminophen (TYLENOL) 500 MG tablet Take 325 mg by mouth every 6 (six) hours as needed for mild pain or moderate pain.   Yes [provider]  amitriptyline (ELAVIL) 25 MG tablet Take 25 mg by mouth at bedtime. 01/21/21  Yes [provider]  aspirin EC 325 MG tablet Take 325mg s once daily on non dialysis days. Dialysis days are Mon, Wed, Fri   Yes [provider]  atorvastatin (LIPITOR) 80 MG tablet Take 1 tablet (80 mg total) by mouth daily at 6 PM. 04/10/15  Yes Doree Fudge M, PA-C  brimonidine  (ALPHAGAN) 0.2 % ophthalmic solution Place 1 drop into both eyes in the morning. 03/11/20  Yes [provider]  hydrALAZINE (APRESOLINE) 50 MG tablet Take 1 tablet (50 mg total) by mouth 3 (three) times daily. 02/01/21  Yes Emokpae, Courage, MD  insulin glargine (LANTUS) 100 UNIT/ML injection Inject 10-15 Units into the skin at bedtime. Sliding scale over 200   Yes [provider]  latanoprost (XALATAN) 0.005 % ophthalmic solution Place 2 drops into both eyes 2 (two) times daily. 08/27/16  Yes [provider]  minoxidil (LONITEN) 10 MG tablet Take 0.5 tablets (5 mg total) by mouth at bedtime. Patient does not take as prescribed. Sometimes takes a 1/2 tablet depending on BP readings Patient taking differently: Take 5-10 mg by mouth at bedtime. Patient does not take as prescribed. Sometimes takes a 1/2 tablet depending on BP readings 02/01/21  Yes Emokpae, Courage, MD  Multiple Vitamin (DAILY VITAMIN PO) Take 1 tablet by mouth daily as needed (supplement).   Yes [provider]  Omega-3 Fatty Acids (FISH OIL) 1000 MG CAPS Take 1,000 mg by mouth daily.   Yes [provider]  OVER THE COUNTER MEDICATION Take 1 tablet by mouth daily. Beet Root   Yes [provider]  verapamil (CALAN-SR) 120 MG CR tablet Take 120 mg by mouth See admin instructions. Take 1 tablet in the morning on the days that you do not have Dialysis. Do Not take any on the days you have Dialysis.   Yes [provider]    Social History   Socioeconomic History   Marital status: Widowed    Spouse name: Not on file   Number of children: Not on file   Years of education: Not on file   Highest education level: Not on file  Occupational History   Not on file  Tobacco Use   Smoking status: Never   Smokeless tobacco: Former    Types: Chew    Quit date: 06/14/2010  Vaping Use   Vaping status: Never Used  Substance and Sexual Activity   Alcohol use: No   Drug use: No   Sexual  activity: Not on file  Other Topics Concern   Not on file  Social History Narrative   Not on file   Social Determinants of Health   Financial Resource Strain: Not on file  Food Insecurity: No Food Insecurity (02/17/2023)   Hunger Vital Sign    Worried About Running Out of Food in the Last Year: Never true    Ran Out of Food in the Last Year: Never true  Transportation Needs: No Transportation Needs (02/17/2023)   PRAPARE - Administrator, Civil Service (Medical): No    Lack of Transportation (Non-Medical): No  Physical  Activity: Not on file  Stress: Not on file  Social Connections: Not on file  Intimate Partner Violence: Not At Risk (02/17/2023)   Humiliation, Afraid, Rape, and Kick questionnaire    Fear of Current or Ex-Partner: No    Emotionally Abused: No    Physically Abused: No    Sexually Abused: No     Family History  Problem Relation Age of Onset   Diabetes Mother    Hypertension Mother    Diabetes Father    Hypertension Father    Diabetes Other     ROS: Otherwise negative unless mentioned in HPI  Physical Examination  Vitals:   02/18/23 0000 02/18/23 0500  BP: 120/66 118/63  Pulse: 97 94  Resp: 20 20  Temp: 97.8 F (36.6 C) 97.8 F (36.6 C)  SpO2:     Body mass index is 22.53 kg/m.  General:  WDWN in NAD Gait: Not observed HENT: WNL, normocephalic Pulmonary: normal non-labored breathing, without Rales, rhonchi,  wheezing Cardiac: regular Abdomen:  soft, NT/ND, no masses Skin: without rashes Vascular Exam/Pulses: palpable femoral pulses bilaterally. Nonpalpable pedal pulses on the left Extremities: dry gangrene and sloughing of the left 2nd-5th toes Musculoskeletal: no muscle wasting or atrophy  Neurologic: A&O X 3;  No focal weakness or paresthesias are detected; speech is fluent/normal Psychiatric:  The pt has Normal affect. Lymph:  Unremarkable      CBC    Component Value Date/Time   WBC 11.6 (H) 02/18/2023 0534   RBC 4.19  (L) 02/18/2023 0534   HGB 12.7 (L) 02/18/2023 0534   HCT 40.4 02/18/2023 0534   PLT 254 02/18/2023 0534   MCV 96.4 02/18/2023 0534   MCH 30.3 02/18/2023 0534   MCHC 31.4 02/18/2023 0534   RDW 17.0 (H) 02/18/2023 0534   LYMPHSABS 0.8 02/17/2023 1253   MONOABS 1.0 02/17/2023 1253   EOSABS 0.1 02/17/2023 1253   BASOSABS 0.0 02/17/2023 1253    BMET    Component Value Date/Time   NA 132 (L) 02/18/2023 0534   K 5.1 02/18/2023 0534   CL 89 (L) 02/18/2023 0534   CO2 25 02/18/2023 0534   GLUCOSE 153 (H) 02/18/2023 0534   BUN 63 (H) 02/18/2023 0534   CREATININE 7.78 (H) 02/18/2023 0534   CALCIUM 8.8 (L) 02/18/2023 0534   CALCIUM 8.3 (L) 01/14/2014 1708   GFRNONAA 7 (L) 02/18/2023 0534   GFRAA 10 (L) 07/29/2018 0024    COAGS: Lab Results  Component Value Date   INR 1.5 (H) 12/12/2020   INR 1.4 (H) 12/11/2020   INR 0.98 06/14/2016     Non-Invasive Vascular Imaging:   ABIs pending  Xray L foot: Negative for osteomyelitis  Statin:  Yes.   Beta Blocker:  No. Aspirin:  Yes.   ACEI:  No. ARB:  No. CCB use:  Yes Other antiplatelets/anticoagulants:  No.    ASSESSMENT/PLAN: This is a 72 y.o. male with dry gangrene of the left 2nd-5th toes   -The patient has a several week history of left foot pain.  Upon evaluation in the ED yesterday it was noted he has left 2nd through 5th toes dry gangrene with slough. He did not notice this appearance of his foot until yesterday after getting out of the shower -He denies any fevers or chills at home.  He denies noticing any drainage from his toes. -On exam he has extensive dry gangrene of the left 2nd through 5th toes.  He has palpable femoral pulses.  His pedal pulses  in the left are nonpalpable -X-ray is negative for osteomyelitis in the left foot -He understands he will require angiogram with possible future revascularization in hopes to save his leg and possibly some of his foot.  He will likely require at least a TMA on the left -We  will plan for abdominal aortogram with left lower extremity runoff with possible intervention next week, likely on Tuesday. The patient is agreeable to this. In the meantime the patient will require orthopedic involvement. His left foot should be kept dry.   Loel Dubonnet PA-C [facility] [phone]   VASCULAR STAFF ADDENDUM: I have independently interviewed and examined the patient. I agree with the above.  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 Dry gangrene. 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  Will likely require TMA at a minimum however possible higher depending on angiogram findings.  Will book for Tuesday Will consult Orthopedic surgery pending findings.   Victorino Sparrow MD [facility] Phone Number: [phone] 02/18/2023 8:15 AM

## 2023-02-18 NOTE — Consult Note (Signed)
[facility] Renal Consultation Note    Indication for Consultation:  Management of ESRD/hemodialysis PCP: [facility]   HPI: [name] is a 72 y.o. male with a history of DM2 and ESRD on MWF Outpatient IHD who presented to the ED on 9/5 complaining of a wound on his left foot with malodorous discharge. We were consulted for management of his inpatient dialysis needs. He endorses pain at the site of his LLE wound, which was found to be gangrenous with severe arterial disease of the LLE. At this time he denies swelling, shortness of breath, or chest pain. He has no concerns about dialysis at this time.   Past Medical History:  Diagnosis Date   Arthritis    CAD (coronary artery disease)    STEMI with LAD stent by [facility]anger in 2005   Dependence on renal dialysis (HCC)    Depression    Diabetes mellitus without complication (HCC)    History of kidney stones    Hypertension    Kidney infection    MI (mitral incompetence)    Myocardial infarction (HCC)    Seasonal allergies    Past Surgical History:  Procedure Laterality Date   AV FISTULA PLACEMENT Left 01/30/2014   Procedure: INSERTION OF ARTERIOVENOUS (AV) GORE-TEX GRAFT LEFT UPPER ARM;  Surgeon: Sherren Kerns, MD;  Location: [facility] OR;  Service: Vascular;  Laterality: Left;   BACK SURGERY     CARDIAC CATHETERIZATION N/A 04/01/2015   Procedure: Right/Left Heart Cath and Coronary Angiography;  Surgeon: Marykay Lex, MD;  Location: [facility] INVASIVE CV LAB;  Service: Cardiovascular;  Laterality: N/A;   COLONOSCOPY N/A 10/12/2016   Procedure: COLONOSCOPY;  Surgeon: Corbin Ade, MD;  Location: [facility] ENDO SUITE;  Service: Endoscopy;  Laterality: N/A;  2:00pm   COLONOSCOPY W/ POLYPECTOMY     CORONARY ANGIOPLASTY WITH STENT PLACEMENT  2008   CORONARY ARTERY BYPASS GRAFT N/A 04/03/2015   Procedure: CORONARY ARTERY BYPASS GRAFTING (CABG) x  4 (LIMA to LAD, SVG to DIAGONAL, SVG to OM1, and SVG to RCA)  with EVH from right greater saphenous thigh and partial lower leg vein ;  Surgeon: Kerin Perna, MD;  Location: [facility] OR;  Service: Open Heart Surgery;  Laterality: N/A;   EYE SURGERY Right    lens implant   INSERTION OF DIALYSIS CATHETER Right 01/30/2014   Procedure: INSERTION OF DIALYSIS CATHETER-RIGHT INTERNAL JUGULAR;  Surgeon: Sherren Kerns, MD;  Location: [facility] OR;  Service: Vascular;  Laterality: Right;   IR RADIOLOGIST EVAL & MGMT  12/30/2020   IR RADIOLOGIST EVAL & MGMT  01/08/2021   IR THORACENTESIS ASP PLEURAL SPACE W/IMG GUIDE  03/26/2022   POLYPECTOMY  10/12/2016   Procedure: POLYPECTOMY;  Surgeon: Corbin Ade, MD;  Location: [facility] ENDO SUITE;  Service: Endoscopy;;  colon   SPINE SURGERY     TEE WITHOUT CARDIOVERSION N/A 04/03/2015   Procedure: TRANSESOPHAGEAL ECHOCARDIOGRAM (TEE);  Surgeon: Kerin Perna, MD;  Location: [facility] OR;  Service: Open Heart Surgery;  Laterality: N/A;   Family History  Problem Relation Age of Onset   Diabetes Mother    Hypertension Mother    Diabetes Father    Hypertension Father    Diabetes Other    Social History:  reports that he has never smoked. He quit smokeless tobacco use about 12 years ago.  His smokeless tobacco use included chew. He reports that he does not drink alcohol and does not use drugs.  ROS: As per HPI otherwise negative.   Physical Exam: Vitals:   02/17/23 2347 02/18/23 0000 02/18/23 0500 02/18/23 0743  BP: (!) 150/75 120/66 118/63 (!) 168/82  Pulse: 97 97 94 93  Resp: (!) 21 20 20 18   Temp: 97.6 F (36.4 C) 97.8 F (36.6 C) 97.8 F (36.6 C) 98.3 F (36.8 C)  TempSrc: Oral Oral Oral Oral  SpO2: 97%     Weight:      Height:         General: Well developed, thin appearing elderly male, in no acute distress. Head: Normocephalic, atraumatic, sclera non-icteric, mucus membranes are moist. Neck: Supple without lymphadenopathy/masses. JVD not elevated. Lungs: Clear bilaterally to auscultation without wheezes, rales, or  rhonchi. Breathing is unlabored. Heart: RRR with normal S1, S2. No murmurs, rubs, or gallops appreciated. Abdomen: Soft, non-tender, non-distended with normoactive bowel sounds. No rebound/guarding. No obvious abdominal masses. Musculoskeletal:  Strength and tone appear normal for age. Lower extremities: Diminished pedal pulses in the right and absent pedal pulses in the left. No edema in bilateral lower extremities.  Neuro: Alert and oriented X 3. Moves all extremities spontaneously. Psych:  Responds to questions appropriately with a normal affect. Dialysis Access: Left Upper Arm Graft - in good repair with +bruit/thrill and no edema/erythema/warmth to palpation.  Allergies  Allergen Reactions   Shellfish Allergy Hives   Prior to Admission medications   Medication Sig Start Date End Date Taking? Authorizing Provider  acetaminophen (TYLENOL) 500 MG tablet Take 325 mg by mouth every 6 (six) hours as needed for mild pain or moderate pain.   Yes [provider]  amitriptyline (ELAVIL) 25 MG tablet Take 25 mg by mouth at bedtime. 01/21/21  Yes [provider]  aspirin EC 325 MG tablet Take 325mg s once daily on non dialysis days. Dialysis days are Mon, Wed, Fri   Yes [provider]  atorvastatin (LIPITOR) 80 MG tablet Take 1 tablet (80 mg total) by mouth daily at 6 PM. 04/10/15  Yes Doree Fudge M, PA-C  brimonidine (ALPHAGAN) 0.2 % ophthalmic solution Place 1 drop into both eyes in the morning. 03/11/20  Yes [provider]  hydrALAZINE (APRESOLINE) 50 MG tablet Take 1 tablet (50 mg total) by mouth 3 (three) times daily. 02/01/21  Yes Emokpae, Courage, MD  insulin glargine (LANTUS) 100 UNIT/ML injection Inject 10-15 Units into the skin at bedtime. Sliding scale over 200   Yes [provider]  latanoprost (XALATAN) 0.005 % ophthalmic solution Place 2 drops into both eyes 2 (two) times daily. 08/27/16  Yes [provider]  minoxidil (LONITEN)  10 MG tablet Take 0.5 tablets (5 mg total) by mouth at bedtime. Patient does not take as prescribed. Sometimes takes a 1/2 tablet depending on BP readings Patient taking differently: Take 5-10 mg by mouth at bedtime. Patient does not take as prescribed. Sometimes takes a 1/2 tablet depending on BP readings 02/01/21  Yes Emokpae, Courage, MD  Multiple Vitamin (DAILY VITAMIN PO) Take 1 tablet by mouth daily as needed (supplement).   Yes [provider]  Omega-3 Fatty Acids (FISH OIL) 1000 MG CAPS Take 1,000 mg by mouth daily.   Yes [provider]  OVER THE COUNTER MEDICATION Take 1 tablet by mouth daily. Beet Root   Yes [provider]  verapamil (CALAN-SR) 120 MG CR tablet Take 120 mg by mouth See admin instructions. Take 1 tablet in the morning on the days that you do not have Dialysis. Do Not take  any on the days you have Dialysis.   Yes [provider]   Current Facility-Administered Medications  Medication Dose Route Frequency Provider Last Rate Last Admin   acetaminophen (TYLENOL) tablet 650 mg  650 mg Oral Q6H PRN Tat, Onalee Hua, MD   650 mg at 02/18/23 1109   Or   acetaminophen (TYLENOL) suppository 650 mg  650 mg Rectal Q6H PRN Tat, Onalee Hua, MD       amitriptyline (ELAVIL) tablet 25 mg  25 mg Oral Maura Crandall, MD   25 mg at 02/17/23 2158   aspirin EC tablet 325 mg  325 mg Oral Once per day on Sunday Tuesday Thursday Saturday Catarina Hartshorn, MD   325 mg at 02/17/23 2100   atorvastatin (LIPITOR) tablet 80 mg  80 mg Oral q1800 Tat, Onalee Hua, MD       brimonidine (ALPHAGAN) 0.2 % ophthalmic solution 1 drop  1 drop Both Eyes q AM Tat, David, MD   1 drop at 02/18/23 0846   cefTRIAXone (ROCEPHIN) 2 g in sodium chloride 0.9 % 100 mL IVPB  2 g Intravenous Q24H Catarina Hartshorn, MD       heparin injection 5,000 Units  5,000 Units Subcutaneous Andres Labrum, MD   5,000 Units at 02/18/23 1610   hydrALAZINE (APRESOLINE) tablet 50 mg  50 mg Oral TID Catarina Hartshorn, MD   50 mg at 02/18/23  0847   influenza vaccine adjuvanted (FLUAD) injection 0.5 mL  0.5 mL Intramuscular Tomorrow-1000 Tat, Onalee Hua, MD       insulin aspart (novoLOG) injection 0-9 Units  0-9 Units Subcutaneous TID WC Ghimire, Werner Lean, MD       latanoprost (XALATAN) 0.005 % ophthalmic solution 2 drop  2 drop Both Eyes BID Tat, Onalee Hua, MD   2 drop at 02/18/23 0847   minoxidil (LONITEN) tablet 5 mg  5 mg Oral Maura Crandall, MD   5 mg at 02/17/23 2159   ondansetron (ZOFRAN) tablet 4 mg  4 mg Oral Q6H PRN Tat, Onalee Hua, MD       Or   ondansetron [facility]) injection 4 mg  4 mg Intravenous Q6H PRN Tat, Onalee Hua, MD       vancomycin (VANCOCIN) IVPB 1000 mg/200 mL premix  1,000 mg Intravenous Q M,W,F-HD Catarina Hartshorn, MD       Melene Muller ON 02/19/2023] verapamil (CALAN-SR) CR tablet 120 mg  120 mg Oral Once per day on Sunday Tuesday Thursday Saturday Catarina Hartshorn, MD       Labs: Basic Metabolic Panel: Recent Labs  Lab 02/17/23 1253 02/18/23 0534  NA 130* 132*  K 4.4 5.1  CL 86* 89*  CO2 25 25  GLUCOSE 112* 153*  BUN 52* 63*  CREATININE 6.80* 7.78*  CALCIUM 8.8* 8.8*  PHOS  --  7.7*   Liver Function Tests: Recent Labs  Lab 02/18/23 0534  ALBUMIN 2.3*   No results for input(s): "LIPASE", "AMYLASE" in the last 168 hours. No results for input(s): "AMMONIA" in the last 168 hours. CBC: Recent Labs  Lab 02/17/23 1253 02/18/23 0534  WBC 15.2* 11.6*  NEUTROABS 13.2*  --   HGB 13.0 12.7*  HCT 42.4 40.4  MCV 96.4 96.4  PLT 279 254   Cardiac Enzymes: No results for input(s): "CKTOTAL", "CKMB", "CKMBINDEX", "TROPONINI" in the last 168 hours. CBG: Recent Labs  Lab 02/17/23 1326 02/17/23 1854 02/17/23 2126  GLUCAP 101* 67* 206*   Iron Studies: No results for input(s): "IRON", "TIBC", "TRANSFERRIN", "FERRITIN" in the last 72  hours. Studies/Results: VAS [place] ABI WITH/WO TBI  Result Date: 02/18/2023  LOWER EXTREMITY DOPPLER STUDY Patient Name:  [name]  Date of Exam:   02/18/2023 Medical Rec #: [id number]         Accession #:    [id number] Date of Birth: August 02, 1950        Patient Gender: M Patient Age:   20 years Exam Location:  [facility] Procedure:      VAS [place] ABI WITH/WO TBI Referring Phys: [facility] --------------------------------------------------------------------------------  Indications: Gangrene. High Risk Factors: Hypertension, hyperlipidemia, Diabetes, past history of                    smoking, prior MI, coronary artery disease. Other Factors: ESRD (HD), HX CABG.  Comparison Study: Previous exam (Pre-CABG) on 03/30/2015 bilateral mild resting                   ischemia Performing Technologist: Hill, Jody RVT, RDMS  Examination Guidelines: A complete evaluation includes at minimum, Doppler waveform signals and systolic blood pressure reading at the level of bilateral brachial, anterior tibial, and posterior tibial arteries, when vessel segments are accessible. Bilateral testing is considered an integral part of a complete examination. Photoelectric Plethysmograph (PPG) waveforms and toe systolic pressure readings are included as required and additional duplex testing as needed. Limited examinations for reoccurring indications may be performed as noted.  ABI Findings: +---------+------------------+-----+----------+--------+ Right    Rt Pressure (mmHg)IndexWaveform  Comment  +---------+------------------+-----+----------+--------+ Brachial 147                    triphasic          +---------+------------------+-----+----------+--------+ PTA      80                0.54 monophasic         +---------+------------------+-----+----------+--------+ DP       83                0.56 monophasic         +---------+------------------+-----+----------+--------+ Great Toe0                 0.00 Absent             +---------+------------------+-----+----------+--------+ +---------+------------------+-----+----------+-------+ Left     Lt Pressure (mmHg)IndexWaveform  Comment  +---------+------------------+-----+----------+-------+ Brachial                                  HD      +---------+------------------+-----+----------+-------+ PTA      75                0.51 monophasic        +---------+------------------+-----+----------+-------+ DP       0                 0.00 absent            +---------+------------------+-----+----------+-------+ Great Toe0                 0.00 Absent            +---------+------------------+-----+----------+-------+ +-------+-----------+-----------+------------+------------+ ABI/TBIToday's ABIToday's TBIPrevious ABIPrevious TBI +-------+-----------+-----------+------------+------------+ Right  0.56       Absent     0.82                     +-------+-----------+-----------+------------+------------+ Left   0.51       Absent  0.83                     +-------+-----------+-----------+------------+------------+  Summary: Right: Resting right ankle-brachial index indicates moderate right lower extremity arterial disease. The right toe-brachial index is absent. Left: Resting left ankle-brachial index indicates severe left lower extremity arterial disease. The left toe-brachial index is absent. *See table(s) above for measurements and observations.     Preliminary    DG Foot Complete Left  Result Date: 02/17/2023 CLINICAL DATA:  Gangrene, infection. Toe pain for 1 week. Patient's 3rd, 4th and 5th toes are reported to be black. EXAM: LEFT FOOT - COMPLETE 3+ VIEW COMPARISON:  None Available. FINDINGS: The bones are diffusely demineralized. There is an old fracture of the 5th metatarsal. Possible chronic osteolysis of the distal tufts of the 2nd, 4th and 5th phalanges without evidence of acute bone destruction. The soft tissues of the 3rd, 4th and 5th toes appear diffusely atretic, likely due to reported gangrene. No evidence of foreign body or soft tissue emphysema. Scattered vascular calcifications are noted.  IMPRESSION: 1. Diffuse soft tissue thinning of the 3rd, 4th and 5th toes, likely due to reported gangrene. Possible mild associated acro-osteolysis. 2. No evidence of acute bone destruction to suggest osteomyelitis. Electronically Signed   By: Carey Bullocks M.D.   On: 02/17/2023 14:38    Dialysis Orders:  [facility]) - MWF Time: 4h 15 min Qb: 500, Qd: 500 Bath: 2k/2Ca+ EDW: 82kg Heparin 2000u at start  Assessment/Plan: ESRD: Volume status is OK. BUN & Cr are elevated, but should improve with dialysis. I have placed Dialysis orders for upstairs HD today, not urgent. Per OP HD Nurse, he has a history of hypotension when nearing EDW, so may need to adjust to 82.5-83.0 depending on fluid status and symptomatic changes. Trend renal panel and CBCs during his stay. Continue renal diet, 1200 mL fluid restriction, and avoid venipuncture/BP measurement on access arm. Monitor for symptomatic changes with dialysis including hypotension, dizziness, cramping, or shortness of breath.  Hyperphosphatemia: Needs binders, P is 7.7. Placed orders for sevelamer for MD to review.  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 Vascular plans for aortogram with lower extremity runoff. He will likely need L TMA per Vascular. Negative for osteomyelitis at this time. On Abx therapy. We appreciate the Vascular team's help with this case.  Anemia of CKD: CBCs are OK at this time. Continue to trend and assess need for iron repletion/ESA as indicated. DM2: Continue to exercise optimal glycemic control, avoiding sweetened beverages and complex carbohydrates. Managed by hospital service.   Santiago Bumpers, PA-S2 02/18/2023, 12:02 PM  [facility]

## 2023-02-18 NOTE — Progress Notes (Signed)
PROGRESS NOTE        PATIENT DETAILS Name: [name] Age: 72 y.o. Sex: male Date of Birth: [birth year] Admit Date: 02/17/2023 Admitting Physician Catarina Hartshorn, MD PCP:[facility]  Brief Summary: Patient is a 72 y.o.  male with history of ESRD on HD MWF, DM-2, HTN, CAD s/p CABG-who presented [facility] with left diabetic foot with gangrenous appearing toes-patient was transferred to [facility] for Ortho/vascular surgery evaluation.  Significant events: 9/5>> admit to [facility]-transfer to [facility] from [facility]  Significant studies: 9/5>> x-ray left foot: There is soft tissue thickening of third, fourth, fifth toes-possible associated acro-osteolysis.  Significant microbiology data: None  Procedures: None  Consults: Vascular surgery Orthopedics  Subjective: Lying comfortably in bed-denies any chest pain or shortness of breath.  Foot appears unchanged.  Objective: Vitals: Blood pressure (!) 168/82, pulse 93, temperature 98.3 F (36.8 C), temperature source Oral, resp. rate 18, height 6\' 5"  (1.956 m), weight 86.2 kg, SpO2 97%.   Exam: Gen Exam:Alert awake-not in any distress HEENT:atraumatic, normocephalic Chest: B/L clear to auscultation anteriorly CVS:S1S2 regular Abdomen:soft non tender, non distended Extremities:no edema-several necrotic looking toes in the left foot. 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   Weight as of this encounter: 86.2 kg.   Code status:   Code Status: Full Code   DVT Prophylaxis: heparin injection 5,000 Units Start: 02/17/23 2200   Family Communication: None at bedside  Disposition Plan: Status is: Inpatient Remains inpatient appropriate because: Severity of illness   Planned Discharge Destination:Home health   Diet: Diet Order             Diet Carb Modified Fluid consistency: Thin; Room service appropriate? Yes  Diet effective now                     Antimicrobial agents: Anti-infectives (From admission, onward)    Start     Dose/Rate Route Frequency Ordered Stop   02/18/23 1600  vancomycin (VANCOCIN) IVPB 1000 mg/200 mL premix        1,000 mg 200 mL/hr over 60 Minutes Intravenous Every M-W-F (Hemodialysis) 02/17/23 1545  02/18/23 1500  cefTRIAXone (ROCEPHIN) 2 g in sodium chloride 0.9 % 100 mL IVPB        2 g 200 mL/hr over 30 Minutes Intravenous Every 24 hours 02/17/23 1943     02/17/23 1445  cefTRIAXone (ROCEPHIN) 2 g in sodium chloride 0.9 % 100 mL IVPB        2 g 200 mL/hr over 30 Minutes Intravenous  Once 02/17/23 1443 02/17/23 1540   02/17/23 1245  vancomycin (VANCOCIN) IVPB 1000 mg/200 mL premix  Status:  Discontinued        1,000 mg 200 mL/hr over 60 Minutes Intravenous  Once 02/17/23 1232 02/17/23 1239   02/17/23 1245  vancomycin (VANCOREADY) IVPB 1750 mg/350 mL         1,750 mg 175 mL/hr over 120 Minutes Intravenous  Once 02/17/23 1240 02/17/23 1526        MEDICATIONS: Scheduled Meds:  amitriptyline  25 mg Oral QHS   aspirin EC  325 mg Oral Once per day on Sunday Tuesday Thursday Saturday   atorvastatin  80 mg Oral q1800   brimonidine  1 drop Both Eyes q AM   heparin  5,000 Units Subcutaneous Q8H   hydrALAZINE  50 mg Oral TID   influenza vaccine adjuvanted  0.5 mL Intramuscular Tomorrow-1000   latanoprost  2 drop Both Eyes BID   minoxidil  5 mg Oral QHS   [START ON 02/19/2023] verapamil  120 mg Oral Once per day on Sunday Tuesday Thursday Saturday   Continuous Infusions:  cefTRIAXone (ROCEPHIN)  IV     vancomycin     PRN Meds:.acetaminophen **OR** acetaminophen, ondansetron **OR** ondansetron (ZOFRAN) IV   I have personally reviewed following labs and imaging studies  LABORATORY DATA: CBC: Recent Labs  Lab 02/17/23 1253 02/18/23 0534  WBC 15.2* 11.6*  NEUTROABS 13.2*  --   HGB 13.0 12.7*  HCT 42.4 40.4  MCV 96.4 96.4  PLT 279 254    Basic Metabolic Panel: Recent Labs  Lab 02/17/23 1253 02/18/23 0534  NA 130* 132*  K 4.4 5.1  CL 86* 89*  CO2 25 25  GLUCOSE 112* 153*  BUN 52* 63*  CREATININE 6.80* 7.78*  CALCIUM 8.8* 8.8*  PHOS  --  7.7*    GFR: Estimated Creatinine Clearance: 10.6 mL/min (A) (by C-G formula based on SCr of 7.78 mg/dL (H)).  Liver Function Tests: Recent Labs  Lab 02/18/23 0534  ALBUMIN 2.3*   No results for input(s): "LIPASE", "AMYLASE" in the last 168 hours. No results for input(s): "AMMONIA" in the last 168 hours.  Coagulation Profile: No results for input(s): "INR", "PROTIME" in the last 168 hours.  Cardiac Enzymes: No results for input(s): "CKTOTAL", "CKMB", "CKMBINDEX", "TROPONINI" in the last 168 hours.  BNP (last 3 results) No results for input(s): "PROBNP" in the last 8760 hours.  Lipid Profile: No results for input(s): "CHOL", "HDL", "LDLCALC", "TRIG", "CHOLHDL", "LDLDIRECT" in  the last 72 hours.  Thyroid Function Tests: No results for input(s): "TSH", "T4TOTAL", "FREET4", "T3FREE", "THYROIDAB" in the last 72 hours.  Anemia Panel: No results for input(s): "VITAMINB12", "FOLATE", "FERRITIN", "TIBC", "IRON", "RETICCTPCT" in the last 72 hours.  Urine analysis:    Component Value Date/Time   COLORURINE YELLOW 04/03/2015 0306   APPEARANCEUR TURBID (A) 04/03/2015 0306   LABSPEC 1.021 04/03/2015 0306   PHURINE 6.5 04/03/2015 0306   GLUCOSEU 250 (A) 04/03/2015 0306   HGBUR MODERATE (A) 04/03/2015 0306   BILIRUBINUR NEGATIVE 04/03/2015 0306   KETONESUR NEGATIVE 04/03/2015  0306   PROTEINUR >300 (A) 04/03/2015 0306   UROBILINOGEN 0.2 04/03/2015 0306   NITRITE NEGATIVE 04/03/2015 0306   LEUKOCYTESUR LARGE (A) 04/03/2015 0306    Sepsis Labs: Lactic Acid, Venous    Component Value Date/Time   LATICACIDVEN 1.6 12/10/2020 2045    MICROBIOLOGY: No results found for this or any previous visit (from the past 240 hour(s)).  RADIOLOGY STUDIES/RESULTS: VAS [place] ABI WITH/WO TBI  Result Date: 02/18/2023  LOWER EXTREMITY DOPPLER STUDY Patient Name:  [name]  Date of Exam:   02/18/2023 Medical Rec #: [id number]        Accession #:    [id number] Date of Birth: [birth year]        Patient Gender: M Patient Age:   66 years Exam Location:  [facility] Procedure:      VAS [place] ABI WITH/WO TBI Referring Phys: [facility] --------------------------------------------------------------------------------  Indications: Gangrene. High Risk Factors: Hypertension, hyperlipidemia, Diabetes, past history of                    smoking, prior MI, coronary artery disease. Other Factors: ESRD (HD), HX CABG.  Comparison Study: Previous exam (Pre-CABG) on 03/30/2015 bilateral mild resting                   ischemia Performing Technologist: Hill, Jody RVT, RDMS  Examination Guidelines: A complete evaluation includes at minimum, Doppler waveform signals and systolic blood pressure reading at  the level of bilateral brachial, anterior tibial, and posterior tibial arteries, when vessel segments are accessible. Bilateral testing is considered an integral part of a complete examination. Photoelectric Plethysmograph (PPG) waveforms and toe systolic pressure readings are included as required and additional duplex testing as needed. Limited examinations for reoccurring indications may be performed as noted.  ABI Findings: +---------+------------------+-----+----------+--------+ Right    Rt Pressure (mmHg)IndexWaveform  Comment  +---------+------------------+-----+----------+--------+ Brachial 147                    triphasic          +---------+------------------+-----+----------+--------+ PTA      80                0.54 monophasic         +---------+------------------+-----+----------+--------+ DP       83                0.56 monophasic         +---------+------------------+-----+----------+--------+ Great Toe0                 0.00 Absent             +---------+------------------+-----+----------+--------+ +---------+------------------+-----+----------+-------+ Left     Lt Pressure (mmHg)IndexWaveform  Comment +---------+------------------+-----+----------+-------+ Brachial                                  HD      +---------+------------------+-----+----------+-------+ PTA      75                0.51 monophasic        +---------+------------------+-----+----------+-------+ DP       0                 0.00 absent            +---------+------------------+-----+----------+-------+ Great Toe0                 0.00 Absent            +---------+------------------+-----+----------+-------+ +-------+-----------+-----------+------------+------------+  ABI/TBIToday's ABIToday's TBIPrevious ABIPrevious TBI +-------+-----------+-----------+------------+------------+ Right  0.56       Absent     0.82                      +-------+-----------+-----------+------------+------------+ Left   0.51       Absent     0.83                     +-------+-----------+-----------+------------+------------+  Summary: Right: Resting right ankle-brachial index indicates moderate right lower extremity arterial disease. The right toe-brachial index is absent. Left: Resting left ankle-brachial index indicates severe left lower extremity arterial disease. The left toe-brachial index is absent. *See table(s) above for measurements and observations.     Preliminary    DG Foot Complete Left  Result Date: 02/17/2023 CLINICAL DATA:  Gangrene, infection. Toe pain for 1 week. Patient's 3rd, 4th and 5th toes are reported to be black. EXAM: LEFT FOOT - COMPLETE 3+ VIEW COMPARISON:  None Available. FINDINGS: The bones are diffusely demineralized. There is an old fracture of the 5th metatarsal. Possible chronic osteolysis of the distal tufts of the 2nd, 4th and 5th phalanges without evidence of acute bone destruction. The soft tissues of the 3rd, 4th and 5th toes appear diffusely atretic, likely due to reported gangrene. No evidence of foreign body or soft tissue emphysema. Scattered vascular calcifications are noted. IMPRESSION: 1. Diffuse soft tissue thinning of the 3rd, 4th and 5th toes, likely due to reported gangrene. Possible mild associated acro-osteolysis. 2. No evidence of acute bone destruction to suggest osteomyelitis. Electronically Signed   By: Carey Bullocks M.D.   On: 02/17/2023 14:38     LOS: 1 day   Jeoffrey Massed, MD  [facility]    To contact the attending provider between 7A-7P or the covering provider during after hours 7P-7A, please log into the web site [email or web address] and access using universal [place] password for that web site. If you do not have the password, please call the hospital operator.  02/18/2023, 10:09 AM

## 2023-02-18 NOTE — Plan of Care (Signed)
°  Problem: Clinical Measurements: °Goal: Ability to maintain clinical measurements within normal limits will improve °Outcome: Progressing °  °Problem: Nutrition: °Goal: Adequate nutrition will be maintained °Outcome: Progressing °  °Problem: Coping: °Goal: Level of anxiety will decrease °Outcome: Progressing °  °Problem: Pain Managment: °Goal: General experience of comfort will improve °Outcome: Progressing °  °

## 2023-02-18 NOTE — Progress Notes (Signed)
ABI exam has been completed.   Results can be found under chart review under CV PROC. 02/18/2023 3:03 PM Alajiah Dutkiewicz RVT, RDMS

## 2023-02-19 DIAGNOSIS — I96 Gangrene, not elsewhere classified: Secondary | ICD-10-CM | POA: Diagnosis not present

## 2023-02-19 LAB — CBC WITH DIFFERENTIAL/PLATELET
Abs Immature Granulocytes: 0.08 10*3/uL — ABNORMAL HIGH (ref 0.00–0.07)
Basophils Absolute: 0 10*3/uL (ref 0.0–0.1)
Basophils Relative: 0 %
Eosinophils Absolute: 0.1 10*3/uL (ref 0.0–0.5)
Eosinophils Relative: 1 %
HCT: 43.1 % (ref 39.0–52.0)
Hemoglobin: 13.4 g/dL (ref 13.0–17.0)
Immature Granulocytes: 1 %
Lymphocytes Relative: 6 %
Lymphs Abs: 0.7 10*3/uL (ref 0.7–4.0)
MCH: 28.9 pg (ref 26.0–34.0)
MCHC: 31.1 g/dL (ref 30.0–36.0)
MCV: 93.1 fL (ref 80.0–100.0)
Monocytes Absolute: 0.8 10*3/uL (ref 0.1–1.0)
Monocytes Relative: 7 %
Neutro Abs: 10 10*3/uL — ABNORMAL HIGH (ref 1.7–7.7)
Neutrophils Relative %: 85 %
Platelets: 299 10*3/uL (ref 150–400)
RBC: 4.63 MIL/uL (ref 4.22–5.81)
RDW: 17 % — ABNORMAL HIGH (ref 11.5–15.5)
WBC: 11.7 10*3/uL — ABNORMAL HIGH (ref 4.0–10.5)
nRBC: 0 % (ref 0.0–0.2)

## 2023-02-19 LAB — RENAL FUNCTION PANEL
Albumin: 2.5 g/dL — ABNORMAL LOW (ref 3.5–5.0)
Anion gap: 16 — ABNORMAL HIGH (ref 5–15)
BUN: 36 mg/dL — ABNORMAL HIGH (ref 8–23)
CO2: 27 mmol/L (ref 22–32)
Calcium: 9.2 mg/dL (ref 8.9–10.3)
Chloride: 91 mmol/L — ABNORMAL LOW (ref 98–111)
Creatinine, Ser: 5.82 mg/dL — ABNORMAL HIGH (ref 0.61–1.24)
GFR, Estimated: 10 mL/min — ABNORMAL LOW (ref >60)
Glucose, Bld: 131 mg/dL — ABNORMAL HIGH (ref 70–99)
Phosphorus: 4.9 mg/dL — ABNORMAL HIGH (ref 2.5–4.6)
Potassium: 4.8 mmol/L (ref 3.5–5.1)
Sodium: 134 mmol/L — ABNORMAL LOW (ref 135–145)

## 2023-02-19 LAB — HEPATITIS B SURFACE ANTIBODY, QUANTITATIVE: Hep B S AB Quant (Post): 144 m[IU]/mL

## 2023-02-19 LAB — C-REACTIVE PROTEIN: CRP: 23.7 mg/dL — ABNORMAL HIGH (ref <1.0)

## 2023-02-19 LAB — BRAIN NATRIURETIC PEPTIDE: B Natriuretic Peptide: 2618.1 pg/mL — ABNORMAL HIGH (ref 0.0–100.0)

## 2023-02-19 LAB — PROCALCITONIN: Procalcitonin: 2.29 ng/mL

## 2023-02-19 LAB — GLUCOSE, CAPILLARY
Glucose-Capillary: 111 mg/dL — ABNORMAL HIGH (ref 70–99)
Glucose-Capillary: 114 mg/dL — ABNORMAL HIGH (ref 70–99)
Glucose-Capillary: 167 mg/dL — ABNORMAL HIGH (ref 70–99)
Glucose-Capillary: 188 mg/dL — ABNORMAL HIGH (ref 70–99)

## 2023-02-19 LAB — AMMONIA: Ammonia: 15 umol/L (ref 9–35)

## 2023-02-19 LAB — MAGNESIUM: Magnesium: 2.1 mg/dL (ref 1.7–2.4)

## 2023-02-19 MED ORDER — VANCOMYCIN HCL IN DEXTROSE 1-5 GM/200ML-% IV SOLN
1000.0000 mg | Freq: Once | INTRAVENOUS | Status: DC
  Administered 2023-02-19: 1000 mg via INTRAVENOUS
  Filled 2023-02-19: qty 200

## 2023-02-19 MED ORDER — MELATONIN 5 MG PO TABS
5.0000 mg | ORAL_TABLET | Freq: Every evening | ORAL | Status: DC | PRN
  Administered 2023-02-20 – 2023-02-27 (×8): 5 mg via ORAL
  Filled 2023-02-19 (×8): qty 1

## 2023-02-19 MED ORDER — SODIUM CHLORIDE 0.9 % IV BOLUS
250.0000 mL | Freq: Once | INTRAVENOUS | Status: AC
  Administered 2023-02-20: 250 mL via INTRAVENOUS

## 2023-02-19 NOTE — Progress Notes (Signed)
Received patient in bed to unit.  Alert and oriented X4 Informed consent signed and in chart.   TX duration:. HOURS  Patient tolerated well.  Transported back to the room  Alert, without acute distress.  Hand-off given to patient's nurse.   Access used: AVF Access issues: NONE  Total UF removed: 3000L Medication(s) given: 2000 UNITS HEPARIN BOLUS Post HD VS: SEE TABLE BELOW Post HD weight: 81.7KG    02/19/23 0412  Vitals  Temp 99.3 F (37.4 C)  Temp Source Oral  BP (!) 152/70  MAP (mmHg) 93  BP Location Right Arm  BP Method Automatic  Patient Position (if appropriate) Lying  Pulse Rate 93  Pulse Rate Source Monitor  ECG Heart Rate 92  Resp 20  Oxygen Therapy  SpO2 92 %  O2 Device Room Air  Patient Activity (if Appropriate) In bed  Pulse Oximetry Type Continuous  During Treatment Monitoring  Blood Flow Rate (mL/min) 0 mL/min  Arterial Pressure (mmHg) -0.4 mmHg  Venous Pressure (mmHg) -1.61 mmHg  TMP (mmHg) -52.72 mmHg  Ultrafiltration Rate (mL/min) 1132 mL/min  Dialysate Flow Rate (mL/min) 0 ml/min  Duration of HD Treatment -hour(s) 3.5 hour(s)  Cumulative Fluid Removed (mL) per Treatment  3000.22  HD Safety Checks Performed Yes  Intra-Hemodialysis Comments Tolerated well  Post Treatment  Dialyzer Clearance Lightly streaked  Hemodialysis Intake (mL) 0 mL  Liters Processed 84  Fluid Removed (mL) 3000 mL  Tolerated HD Treatment Yes  Post-Hemodialysis Comments GOAL MET  AVG/AVF Arterial Site Held (minutes) 5 minutes  AVG/AVF Venous Site Held (minutes) 4 minutes  Fistula / Graft Left Upper arm Arteriovenous vein graft  Placement Date/Time: 01/30/14 1232   Placed prior to admission: No  Orientation: Left  Access Location: Upper arm  Access Type: Arteriovenous vein graft  Expiration Date: 03/27/18  Site Condition No complications  Fistula / Graft Assessment Present;Bruit;Thrill;Aneurysm present  Status Deaccessed;Patent;Flushed      [facility]

## 2023-02-19 NOTE — Progress Notes (Signed)
PROGRESS NOTE        PATIENT DETAILS Name: [name] Age: 72 y.o. Sex: male Date of Birth: 03-14-1951 Admit Date: 02/17/2023 Admitting Physician Catarina Hartshorn, MD PCP:[facility]  Brief Summary: Patient is a 72 y.o.  male with history of ESRD on HD MWF, DM-2, HTN, CAD s/p CABG-who presented [facility] ED with left diabetic foot with gangrenous appearing toes-patient was transferred to [facility] for Ortho/vascular surgery evaluation.  Significant events: 9/5>> admit to [facility]-transfer [facility] from [facility]  Significant studies: 9/5>> x-ray left foot: There is soft tissue thickening of third, fourth, fifth toes-possible associated acro-osteolysis.  Significant microbiology data: None  Procedures: ABI -   Right: Resting right ankle-brachial index indicates moderate right lower extremity arterial disease. The right toe-brachial index is absent. Left: Resting left ankle-brachial index indicates severe left lower extremity arterial disease. The left toe-brachial index is absent.  Consults: Vascular surgery Orthopedics  Subjective:   Patient in bed, appears comfortable, denies any headache, no fever, no chest pain or pressure, no shortness of breath , no abdominal pain. No new focal weakness.   Objective: Vitals: Blood pressure 136/75, pulse 90, temperature 99.6 F (37.6 C), temperature source Oral, resp. rate 19, height 6\' 5"  (1.956 m), weight 81.7 kg, SpO2 100%.   Exam:  Awake Alert, No new F.N deficits, Normal affect [place].AT,PERRAL Supple Neck, No JVD,   Symmetrical Chest wall movement, Good air movement bilaterally, CTAB RRR,No Gallops, Rubs or new Murmurs,  +ve B.Sounds, Abd Soft, No tenderness,   Several necrotic looking toes in the left foot.   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 -POA Unchanged overnight Continue Vancomycin/Rocephin Vascular surgery planning angiogram 02/22/23, Vascular surgery will  subsequently consult orthopedics for possible transmetatarsal amputation  ESRD on HD MWF Nephrology following  History of CAD s/p CABG No anginal symptoms Aspirin/statin  HTN BP stable Continue verapamil/hydralazine/minoxidil  HLD Statin  DM-2 (A1c 8.4 on 9/5) - Started SSI-follow CBG trend-and start long-acting insulin if needed, DM education  CBG (last 3)  Recent Labs    02/18/23 1634 02/18/23 2101 02/19/23 0828  GLUCAP 94 236* 111*   Lab Results  Component Value Date   HGBA1C 8.4 (H) 02/17/2023     Code status:   Code Status: Full Code   DVT Prophylaxis: heparin injection 5,000 Units Start: 02/17/23 2200   Family Communication: None at bedside  Disposition Plan: Status is: Inpatient Remains inpatient appropriate because: Severity of illness   Planned Discharge Destination:Home health   Diet: Diet Order             Diet Carb Modified Fluid consistency: Thin; Room service appropriate? Yes  Diet effective now                    MEDICATIONS: Scheduled Meds:  amitriptyline  25 mg Oral QHS   aspirin EC  325 mg Oral Once per day on Sunday Tuesday Thursday Saturday   atorvastatin  80 mg Oral q1800   brimonidine  1 drop Both Eyes q AM   Chlorhexidine Gluconate Cloth  6 each Topical Q0600   heparin  5,000 Units Subcutaneous Q8H   hydrALAZINE  50 mg Oral TID   influenza vaccine adjuvanted  0.5 mL Intramuscular Tomorrow-1000   insulin aspart  0-9 Units Subcutaneous TID WC   latanoprost  2 drop Both Eyes BID   minoxidil  5 mg Oral QHS   sevelamer carbonate  800 mg Oral TID WC   verapamil  120 mg Oral Once per day on Sunday Tuesday Thursday Saturday   Continuous Infusions:  cefTRIAXone (ROCEPHIN)  IV Stopped (02/18/23 1505)   vancomycin     PRN Meds:.acetaminophen **OR** acetaminophen, ondansetron **OR** ondansetron (ZOFRAN) IV   I have personally reviewed following labs and imaging studies  LABORATORY DATA:  Recent Labs  Lab 02/17/23 1253  02/18/23 0534 02/19/23 0651  WBC 15.2* 11.6* 11.7*  HGB 13.0 12.7* 13.4  HCT 42.4 40.4 43.1  PLT 279 254 299  MCV 96.4 96.4 93.1  MCH 29.5 30.3 28.9  MCHC 30.7 31.4 31.1  RDW 17.1* 17.0* 17.0*  LYMPHSABS 0.8  --  0.7  MONOABS 1.0  --  0.8  EOSABS 0.1  --  0.1  BASOSABS 0.0  --  0.0    Recent Labs  Lab 02/17/23 1253 02/17/23 2034 02/18/23 0534 02/19/23 0651  NA 130*  --  132* 134*  K 4.4  --  5.1 4.8  CL 86*  --  89* 91*  CO2 25  --  25 27  ANIONGAP 19*  --  18* 16*  GLUCOSE 112*  --  153* 131*  BUN 52*  --  63* 36*  CREATININE 6.80*  --  7.78* 5.82*  ALBUMIN  --   --  2.3* 2.5*  CRP  --  22.7*  --  23.7*  PROCALCITON  --   --   --  2.29  HGBA1C  --  8.4*  --   --   AMMONIA  --   --   --  15  BNP  --   --   --  2,618.1*  MG  --   --   --  2.1  CALCIUM 8.8*  --  8.8* 9.2    Lab Results  Component Value Date   CHOL 210 (H) 04/01/2015   HDL 38 (L) 04/01/2015   LDLCALC 158 (H) 04/01/2015   TRIG 201.0 (H) 03/16/2022   CHOLHDL 5.5 04/01/2015      Recent Labs  Lab 02/17/23 1253 02/17/23 2034 02/18/23 0534 02/19/23 0651  CRP  --  22.7*  --  23.7*  PROCALCITON  --   --   --  2.29  HGBA1C  --  8.4*  --   --   AMMONIA  --   --   --  15  BNP  --   --   --  2,618.1*  MG  --   --   --  2.1  CALCIUM 8.8*  --  8.8* 9.2   RADIOLOGY STUDIES/RESULTS: VAS [place] ABI WITH/WO TBI  Result Date: 02/18/2023  LOWER EXTREMITY DOPPLER STUDY Patient Name:  ALIK HEINKE  Date of Exam:   02/18/2023 Medical Rec #: [id number]        Accession #:    [id number] Date of Birth: 02/06/1951        Patient Gender: M Patient Age:   25 years Exam Location:  [facility] Procedure:      VAS [place] ABI WITH/WO TBI Referring Phys: [facility] --------------------------------------------------------------------------------  Indications: Gangrene. High Risk Factors: Hypertension, hyperlipidemia, Diabetes, past history of                    smoking, prior MI, coronary artery disease. Other Factors:  ESRD (HD), HX CABG.  Comparison Study: Previous exam (Pre-CABG) on  03/30/2015 bilateral mild resting                   ischemia Performing Technologist: Ernestene Mention RVT, RDMS  Examination Guidelines: A complete evaluation includes at minimum, Doppler waveform signals and systolic blood pressure reading at the level of bilateral brachial, anterior tibial, and posterior tibial arteries, when vessel segments are accessible. Bilateral testing is considered an integral part of a complete examination. Photoelectric Plethysmograph (PPG) waveforms and toe systolic pressure readings are included as required and additional duplex testing as needed. Limited examinations for reoccurring indications may be performed as noted.  ABI Findings: +---------+------------------+-----+----------+--------+ Right    Rt Pressure (mmHg)IndexWaveform  Comment  +---------+------------------+-----+----------+--------+ Brachial 147                    triphasic          +---------+------------------+-----+----------+--------+ PTA      80                0.54 monophasic         +---------+------------------+-----+----------+--------+ DP       83                0.56 monophasic         +---------+------------------+-----+----------+--------+ Great Toe0                 0.00 Absent             +---------+------------------+-----+----------+--------+ +---------+------------------+-----+----------+-------+ Left     Lt Pressure (mmHg)IndexWaveform  Comment +---------+------------------+-----+----------+-------+ Brachial                                  HD      +---------+------------------+-----+----------+-------+ PTA      75                0.51 monophasic        +---------+------------------+-----+----------+-------+ DP       0                 0.00 absent            +---------+------------------+-----+----------+-------+ Great Toe0                 0.00 Absent             +---------+------------------+-----+----------+-------+ +-------+-----------+-----------+------------+------------+ ABI/TBIToday's ABIToday's TBIPrevious ABIPrevious TBI +-------+-----------+-----------+------------+------------+ Right  0.56       Absent     0.82                     +-------+-----------+-----------+------------+------------+ Left   0.51       Absent     0.83                     +-------+-----------+-----------+------------+------------+  Summary: Right: Resting right ankle-brachial index indicates moderate right lower extremity arterial disease. The right toe-brachial index is absent. Left: Resting left ankle-brachial index indicates severe left lower extremity arterial disease. The left toe-brachial index is absent. *See table(s) above for measurements and observations.     Preliminary    DG Foot Complete Left  Result Date: 02/17/2023 CLINICAL DATA:  Gangrene, infection. Toe pain for 1 week. Patient's 3rd, 4th and 5th toes are reported to be black. EXAM: LEFT FOOT - COMPLETE 3+ VIEW COMPARISON:  None Available. FINDINGS: The bones are diffusely demineralized. There is an old fracture of the 5th metatarsal. Possible chronic osteolysis of the distal  tufts of the 2nd, 4th and 5th phalanges without evidence of acute bone destruction. The soft tissues of the 3rd, 4th and 5th toes appear diffusely atretic, likely due to reported gangrene. No evidence of foreign body or soft tissue emphysema. Scattered vascular calcifications are noted. IMPRESSION: 1. Diffuse soft tissue thinning of the 3rd, 4th and 5th toes, likely due to reported gangrene. Possible mild associated acro-osteolysis. 2. No evidence of acute bone destruction to suggest osteomyelitis. Electronically Signed   By: Carey Bullocks M.D.   On: 02/17/2023 14:38     LOS: 2 days   Signature  -    Susa Raring M.D on 02/19/2023 at 11:08 AM   -  To page go to [email or web address]

## 2023-02-19 NOTE — Progress Notes (Signed)
[facility] Progress Note   Subjective:    Seen and examined patient at bedside. Tolerated HD overnight with net UF 3L. Denies SOB, CP, and N/V. Noted ABI (+) lower extremity arterial disease. Vascular and Ortho following.  Objective Vitals:   02/19/23 0412 02/19/23 0425 02/19/23 0800 02/19/23 1200  BP: (!) 152/70 136/75    Pulse: 93 98 90 95  Resp: 20 19    Temp: 99.3 F (37.4 C) 99 F (37.2 C) 99.6 F (37.6 C) 97.9 F (36.6 C)  TempSrc: Oral Oral Oral Oral  SpO2: 92%  100% 94%  Weight:  81.7 kg    Height:       Physical Exam General: Awake, alert, NAD, on RA Heart: S1 and S2; No MRGs Lungs: CTA throughout; No wheezing, rales, or rhonchi Abdomen: Soft and non-tender Extremities: LEs discolored; No LE edema Dialysis Access: L AVG (+) B/T   Filed Weights   02/17/23 1504 02/19/23 0037 02/19/23 0425  Weight: 86.2 kg 81.9 kg 81.7 kg    Intake/Output Summary (Last 24 hours) at 02/19/2023 1518 Last data filed at 02/19/2023 1400 Gross per 24 hour  Intake 620 ml  Output 3000 ml  Net -2380 ml    Additional Objective Labs: Basic Metabolic Panel: Recent Labs  Lab 02/17/23 1253 02/18/23 0534 02/19/23 0651  NA 130* 132* 134*  K 4.4 5.1 4.8  CL 86* 89* 91*  CO2 25 25 27   GLUCOSE 112* 153* 131*  BUN 52* 63* 36*  CREATININE 6.80* 7.78* 5.82*  CALCIUM 8.8* 8.8* 9.2  PHOS  --  7.7* 4.9*   Liver Function Tests: Recent Labs  Lab 02/18/23 0534 02/19/23 0651  ALBUMIN 2.3* 2.5*   No results for input(s): "LIPASE", "AMYLASE" in the last 168 hours. CBC: Recent Labs  Lab 02/17/23 1253 02/18/23 0534 02/19/23 0651  WBC 15.2* 11.6* 11.7*  NEUTROABS 13.2*  --  10.0*  HGB 13.0 12.7* 13.4  HCT 42.4 40.4 43.1  MCV 96.4 96.4 93.1  PLT 279 254 299   Blood Culture    Component Value Date/Time   SDES ABSCESS 12/11/2020 1534   SPECREQUEST DRAIN 12/11/2020 1534   CULT  12/11/2020 1534    FEW YERSINIA ENTEROCOLITICA GROUP NO ANAEROBES ISOLATED Performed  at [facility], [address], [place], [place] [address]    REPTSTATUS 12/16/2020 FINAL 12/11/2020 1534    Cardiac Enzymes: No results for input(s): "CKTOTAL", "CKMB", "CKMBINDEX", "TROPONINI" in the last 168 hours. CBG: Recent Labs  Lab 02/18/23 1237 02/18/23 1634 02/18/23 2101 02/19/23 0828 02/19/23 1212  GLUCAP 143* 94 236* 111* 114*   Iron Studies: No results for input(s): "IRON", "TIBC", "TRANSFERRIN", "FERRITIN" in the last 72 hours. Lab Results  Component Value Date   INR 1.5 (H) 12/12/2020   INR 1.4 (H) 12/11/2020   INR 0.98 06/14/2016   Studies/Results: VAS [place] ABI WITH/WO TBI  Result Date: 02/18/2023  LOWER EXTREMITY DOPPLER STUDY Patient Name:  [name]  Date of Exam:   02/18/2023 Medical Rec #: [id number]        Accession #:    [id number] Date of Birth: 05-13-1951        Patient Gender: M Patient Age:   72 years Exam Location:  [facility] Procedure:      VAS [place] ABI WITH/WO TBI Referring Phys: [facility] --------------------------------------------------------------------------------  Indications: Gangrene. High Risk Factors: Hypertension, hyperlipidemia, Diabetes, past history of  smoking, prior MI, coronary artery disease. Other Factors: ESRD (HD), HX CABG.  Comparison Study: Previous exam (Pre-CABG) on 03/30/2015 bilateral mild resting                   ischemia Performing Technologist: Hill, Jody RVT, RDMS  Examination Guidelines: A complete evaluation includes at minimum, Doppler waveform signals and systolic blood pressure reading at the level of bilateral brachial, anterior tibial, and posterior tibial arteries, when vessel segments are accessible. Bilateral testing is considered an integral part of a complete examination. Photoelectric Plethysmograph (PPG) waveforms and toe systolic pressure readings are included as required and additional duplex testing as needed. Limited examinations for reoccurring indications may be performed  as noted.  ABI Findings: +---------+------------------+-----+----------+--------+ Right    Rt Pressure (mmHg)IndexWaveform  Comment  +---------+------------------+-----+----------+--------+ Brachial 147                    triphasic          +---------+------------------+-----+----------+--------+ PTA      80                0.54 monophasic         +---------+------------------+-----+----------+--------+ DP       83                0.56 monophasic         +---------+------------------+-----+----------+--------+ Great Toe0                 0.00 Absent             +---------+------------------+-----+----------+--------+ +---------+------------------+-----+----------+-------+ Left     Lt Pressure (mmHg)IndexWaveform  Comment +---------+------------------+-----+----------+-------+ Brachial                                  HD      +---------+------------------+-----+----------+-------+ PTA      75                0.51 monophasic        +---------+------------------+-----+----------+-------+ DP       0                 0.00 absent            +---------+------------------+-----+----------+-------+ Great Toe0                 0.00 Absent            +---------+------------------+-----+----------+-------+ +-------+-----------+-----------+------------+------------+ ABI/TBIToday's ABIToday's TBIPrevious ABIPrevious TBI +-------+-----------+-----------+------------+------------+ Right  0.56       Absent     0.82                     +-------+-----------+-----------+------------+------------+ Left   0.51       Absent     0.83                     +-------+-----------+-----------+------------+------------+  Summary: Right: Resting right ankle-brachial index indicates moderate right lower extremity arterial disease. The right toe-brachial index is absent. Left: Resting left ankle-brachial index indicates severe left lower extremity arterial disease. The left  toe-brachial index is absent. *See table(s) above for measurements and observations.     Preliminary     Medications:  cefTRIAXone (ROCEPHIN)  IV 2 g (02/19/23 1454)   vancomycin      amitriptyline  25 mg Oral QHS   aspirin EC  325 mg Oral Once per day on Sunday Tuesday Thursday Saturday  atorvastatin  80 mg Oral q1800   brimonidine  1 drop Both Eyes q AM   Chlorhexidine Gluconate Cloth  6 each Topical Q0600   heparin  5,000 Units Subcutaneous Q8H   hydrALAZINE  50 mg Oral TID   influenza vaccine adjuvanted  0.5 mL Intramuscular Tomorrow-1000   insulin aspart  0-9 Units Subcutaneous TID WC   latanoprost  2 drop Both Eyes BID   minoxidil  5 mg Oral QHS   sevelamer carbonate  800 mg Oral TID WC   verapamil  120 mg Oral Once per day on Sunday Tuesday Thursday Saturday    Dialysis Orders: [facility] Lewayne Bunting) - MWF Time: 4h 15 min Qb: 500, Qd: 500 Bath: 2k/2Ca+ EDW: 82kg Heparin 2000u at start  Assessment/Plan: 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 On ABXs; plan for angiogram on 9/10 per VVS; Ortho consulted for possible TMA ESRD: On HD MWF, received HD overnight. Next HD 9/10.  HTN/Vol - Bps stable here currently. Per OP HD Nurse, he has a history of hypotension when nearing EDW, so may need to adjust to 82.5-83.0 depending on fluid status and symptomatic changes or adjust his anti-hypertensive medications here. Metabolic bone disease: Phos was 7.7 at admit so binders started. Phos now at goal. Ca is ok. Anemia of CKD: Hgb at goal. Fe/ESA is not indicated at this time. DM2: Continue to exercise optimal glycemic control, avoiding sweetened beverages and complex carbohydrates. Managed by hospital service.  History CAD s/p CABG - on ASA/statin, managed by primary  Salome Holmes, NP [facility] 02/19/2023,3:18 PM  LOS: 2 days

## 2023-02-19 NOTE — Plan of Care (Signed)

## 2023-02-19 NOTE — Evaluation (Signed)
Physical Therapy Evaluation Patient Details Name: [name] MRN: [id number] DOB: [birth year] Today's Date: 02/19/2023  History of Present Illness  72 yo male with onset of sudden development of gangrene to L foot was admitted 9/5, now awaiting vasc surgery procedure on 9/10 for angiogram and potential amputation.  PMHx:  ESRD HD, DM, CAD, CABG, HTN, HLD,  Clinical Impression  Pt was seen for mobility today noting his strength is an issue, his awareness of balance changes and his safety utilization for gait is reduced.  Family aware of safety and understand that he is having cognitive changes, and supportive of asking for <3 hours a day rehab with inpt care.  Pt is non committal with this suggestion, so will recommend and work toward a shorter rehab stay with PT acutely to address deficits below.  Follow for goals of PT as outlined.        If plan is discharge home, recommend the following: A little help with walking and/or transfers;A little help with bathing/dressing/bathroom;Assistance with cooking/housework;Help with stairs or ramp for entrance;Supervision due to cognitive status;Assist for transportation   Can travel by private vehicle   No    Equipment Recommendations None recommended by PT  Recommendations for Other Services       Functional Status Assessment Patient has had a recent decline in their functional status and demonstrates the ability to make significant improvements in function in a reasonable and predictable amount of time.     Precautions / Restrictions Precautions Precautions: Fall Precaution Comments: monitor O2 sats Restrictions Weight Bearing Restrictions: No      Mobility  Bed Mobility Overal bed mobility: Needs Assistance Bed Mobility: Sit to Supine       Sit to supine: Min assist   General bed mobility comments: assist for trunk and LE's    Transfers Overall transfer level: Needs assistance Equipment used: 1 person hand held  assist Transfers: Sit to/from Stand Sit to Stand: Min assist           General transfer comment: min assist to balance and control sitting    Ambulation/Gait Ambulation/Gait assistance: Min assist Gait Distance (Feet): 35 Feet Assistive device: Rolling walker (2 wheels) Gait Pattern/deviations: Step-through pattern, Decreased stride length, Decreased weight shift to left, Wide base of support Gait velocity: reduced Gait velocity interpretation: <1.31 ft/sec, indicative of household ambulator Pre-gait activities: standing balance ck General Gait Details: pt is a bit unaccustomed to using walker and tends to drop it before arriving to destination  Stairs            Wheelchair Mobility     Tilt Bed    Modified Rankin (Stroke Patients Only)       Balance Overall balance assessment: Needs assistance Sitting-balance support: Feet supported Sitting balance-Leahy Scale: Good     Standing balance support: Bilateral upper extremity supported, During functional activity Standing balance-Leahy Scale: Poor                               Pertinent Vitals/Pain Pain Assessment Pain Assessment: No/denies pain    Home Living Family/patient expects to be discharged to:: Skilled nursing facility Living Arrangements: Alone                      Prior Function Prior Level of Function : Needs assist       Physical Assist : Mobility (physical) Mobility (physical): Gait   Mobility Comments: no AD needed,  but family supports daily       Extremity/Trunk Assessment   Upper Extremity Assessment Upper Extremity Assessment: Overall WFL for tasks assessed    Lower Extremity Assessment Lower Extremity Assessment: Generalized weakness;LLE deficits/detail LLE Deficits / Details: stiffness on LLE LLE Coordination: decreased gross motor    Cervical / Trunk Assessment Cervical / Trunk Assessment: Kyphotic (mild)  Communication    Communication Communication: Difficulty communicating thoughts/reduced clarity of speech Cueing Techniques: Verbal cues  Cognition Arousal: Alert Behavior During Therapy: Flat affect, Impulsive Overall Cognitive Status: Difficult to assess                                 General Comments: pt is not aware of safety issues and family not comfortable to talk in front of him        General Comments General comments (skin integrity, edema, etc.): Pt requires help with UE support to stand and is not initially concerned for his instability.  Family is aware and tends to just walk over and supervise him to minimize his safety issues but without making an issue of his awareness    Exercises     Assessment/Plan    PT Assessment Patient needs continued PT services  PT Problem List Decreased strength;Decreased range of motion;Decreased activity tolerance;Decreased balance;Decreased mobility;Decreased coordination;Decreased cognition;Decreased knowledge of use of DME;Decreased safety awareness;Decreased skin integrity       PT Treatment Interventions DME instruction;Gait training;Functional mobility training;Therapeutic activities;Therapeutic exercise;Balance training;Neuromuscular re-education;Patient/family education    PT Goals (Current goals can be found in the Care Plan section)  Acute Rehab PT Goals Patient Stated Goal: none stated PT Goal Formulation: With family Time For Goal Achievement: 03/05/23 Potential to Achieve Goals: Fair    Frequency Min 1X/week     Co-evaluation               AM-PAC PT "6 Clicks" Mobility  Outcome Measure Help needed turning from your back to your side while in a flat bed without using bedrails?: A Little Help needed moving from lying on your back to sitting on the side of a flat bed without using bedrails?: A Little Help needed moving to and from a bed to a chair (including a wheelchair)?: A Little Help needed standing up from a  chair using your arms (e.g., wheelchair or bedside chair)?: A Little Help needed to walk in hospital room?: A Little Help needed climbing 3-5 steps with a railing? : Total 6 Click Score: 16    End of Session   Activity Tolerance: Patient limited by fatigue;Treatment limited secondary to medical complications (Comment) Patient left: in bed;with call bell/phone within reach;with bed alarm set;with family/visitor present Nurse Communication: Mobility status PT Visit Diagnosis: Unsteadiness on feet (R26.81);Muscle weakness (generalized) (M62.81);Difficulty in walking, not elsewhere classified (R26.2)    Time: 1215-1229 (+3244-0102) PT Time Calculation (min) (ACUTE ONLY): 14 min   Charges:   PT Evaluation $PT Eval Moderate Complexity: 1 Mod PT Treatments $Gait Training: 8-22 mins PT General Charges $$ ACUTE PT VISIT: 1 Visit        Ivar Drape 02/19/2023, 3:08 PM  Samul Dada, PT PhD Acute Rehab Dept. Number: [facility] [id number] and [facility] [phone]

## 2023-02-20 ENCOUNTER — Inpatient Hospital Stay (HOSPITAL_COMMUNITY): Payer: Medicare Other

## 2023-02-20 ENCOUNTER — Other Ambulatory Visit: Payer: Self-pay

## 2023-02-20 DIAGNOSIS — R7989 Other specified abnormal findings of blood chemistry: Secondary | ICD-10-CM

## 2023-02-20 DIAGNOSIS — Z951 Presence of aortocoronary bypass graft: Secondary | ICD-10-CM | POA: Diagnosis not present

## 2023-02-20 DIAGNOSIS — I96 Gangrene, not elsewhere classified: Secondary | ICD-10-CM | POA: Diagnosis not present

## 2023-02-20 LAB — C-REACTIVE PROTEIN: CRP: 21.5 mg/dL — ABNORMAL HIGH (ref <1.0)

## 2023-02-20 LAB — VAS US ABI WITH/WO TBI
Left ABI: 0.51
Right ABI: 0.56

## 2023-02-20 LAB — CBC WITH DIFFERENTIAL/PLATELET
Abs Immature Granulocytes: 0.27 10*3/uL — ABNORMAL HIGH (ref 0.00–0.07)
Basophils Absolute: 0 10*3/uL (ref 0.0–0.1)
Basophils Relative: 0 %
Eosinophils Absolute: 0 10*3/uL (ref 0.0–0.5)
Eosinophils Relative: 0 %
HCT: 38.9 % — ABNORMAL LOW (ref 39.0–52.0)
Hemoglobin: 11.7 g/dL — ABNORMAL LOW (ref 13.0–17.0)
Immature Granulocytes: 1 %
Lymphocytes Relative: 3 %
Lymphs Abs: 0.7 10*3/uL (ref 0.7–4.0)
MCH: 29.3 pg (ref 26.0–34.0)
MCHC: 30.1 g/dL (ref 30.0–36.0)
MCV: 97.5 fL (ref 80.0–100.0)
Monocytes Absolute: 1.2 10*3/uL — ABNORMAL HIGH (ref 0.1–1.0)
Monocytes Relative: 6 %
Neutro Abs: 17.3 10*3/uL — ABNORMAL HIGH (ref 1.7–7.7)
Neutrophils Relative %: 90 %
Platelets: 303 10*3/uL (ref 150–400)
RBC: 3.99 MIL/uL — ABNORMAL LOW (ref 4.22–5.81)
RDW: 17.2 % — ABNORMAL HIGH (ref 11.5–15.5)
WBC: 19.5 10*3/uL — ABNORMAL HIGH (ref 4.0–10.5)
nRBC: 0 % (ref 0.0–0.2)

## 2023-02-20 LAB — PROCALCITONIN: Procalcitonin: 2.24 ng/mL

## 2023-02-20 LAB — GLUCOSE, CAPILLARY
Glucose-Capillary: 197 mg/dL — ABNORMAL HIGH (ref 70–99)
Glucose-Capillary: 288 mg/dL — ABNORMAL HIGH (ref 70–99)
Glucose-Capillary: 346 mg/dL — ABNORMAL HIGH (ref 70–99)
Glucose-Capillary: 186 mg/dL — ABNORMAL HIGH (ref 70–99)
Glucose-Capillary: 217 mg/dL — ABNORMAL HIGH (ref 70–99)
Glucose-Capillary: 226 mg/dL — ABNORMAL HIGH (ref 70–99)

## 2023-02-20 LAB — LACTIC ACID, PLASMA: Lactic Acid, Venous: 5.2 mmol/L (ref 0.5–1.9)

## 2023-02-20 LAB — TROPONIN I (HIGH SENSITIVITY)
Troponin I (High Sensitivity): 122 ng/L (ref <18)
Troponin I (High Sensitivity): 245 ng/L (ref <18)
Troponin I (High Sensitivity): 1813 ng/L (ref <18)

## 2023-02-20 LAB — BRAIN NATRIURETIC PEPTIDE: B Natriuretic Peptide: 1153.5 pg/mL — ABNORMAL HIGH (ref 0.0–100.0)

## 2023-02-20 LAB — AMMONIA: Ammonia: 30 umol/L (ref 9–35)

## 2023-02-20 LAB — MAGNESIUM: Magnesium: 2.4 mg/dL (ref 1.7–2.4)

## 2023-02-20 MED ORDER — LOPERAMIDE HCL 2 MG PO CAPS
4.0000 mg | ORAL_CAPSULE | Freq: Once | ORAL | Status: DC
  Filled 2023-02-20: qty 2

## 2023-02-20 MED ORDER — PIPERACILLIN-TAZOBACTAM IN DEX 2-0.25 GM/50ML IV SOLN
2.2500 g | Freq: Three times a day (TID) | INTRAVENOUS | Status: DC
  Administered 2023-02-20 – 2023-02-25 (×11): 2.25 g via INTRAVENOUS
  Filled 2023-02-20 (×17): qty 50

## 2023-02-20 MED ORDER — SODIUM CHLORIDE 0.9 % IV SOLN
100.0000 mg | Freq: Two times a day (BID) | INTRAVENOUS | Status: DC
  Administered 2023-02-20: 100 mg via INTRAVENOUS
  Filled 2023-02-20: qty 100

## 2023-02-20 MED ORDER — SODIUM CHLORIDE 0.9 % IV BOLUS
250.0000 mL | Freq: Once | INTRAVENOUS | Status: AC
  Administered 2023-02-20: 250 mL via INTRAVENOUS

## 2023-02-20 MED ORDER — LACTATED RINGERS IV BOLUS: 250.0000 mL | Freq: Once | INTRAVENOUS | Status: DC

## 2023-02-20 MED ORDER — PROCHLORPERAZINE EDISYLATE 10 MG/2ML IJ SOLN
10.0000 mg | INTRAMUSCULAR | Status: DC | PRN
  Administered 2023-02-20 – 2023-02-22 (×2): 10 mg via INTRAVENOUS
  Filled 2023-02-20 (×2): qty 2

## 2023-02-20 MED ORDER — MIDODRINE HCL 5 MG PO TABS
5.0000 mg | ORAL_TABLET | Freq: Once | ORAL | Status: AC
  Administered 2023-02-20: 5 mg via ORAL
  Filled 2023-02-20: qty 1

## 2023-02-20 MED ORDER — PIPERACILLIN-TAZOBACTAM 3.375 G IVPB
3.3750 g | Freq: Two times a day (BID) | INTRAVENOUS | Status: DC
  Administered 2023-02-20: 3.375 g via INTRAVENOUS
  Filled 2023-02-20: qty 50

## 2023-02-20 MED ORDER — METOPROLOL TARTRATE 25 MG PO TABS
25.0000 mg | ORAL_TABLET | Freq: Two times a day (BID) | ORAL | Status: DC
  Administered 2023-02-22 – 2023-02-28 (×11): 25 mg via ORAL
  Filled 2023-02-20 (×16): qty 1

## 2023-02-20 MED ORDER — HYDRALAZINE HCL 25 MG PO TABS: 25.0000 mg | ORAL_TABLET | Freq: Three times a day (TID) | ORAL | Status: DC

## 2023-02-20 NOTE — Progress Notes (Signed)
This pt refused his night meds Aspirin EC 325 mg and Heparin 5000 unit [place] stating he dont want to take as he is having dialysis tomorrow.

## 2023-02-20 NOTE — Plan of Care (Signed)
Patient needs new iv line for abx otherwise patient states he is feeling better.

## 2023-02-20 NOTE — Progress Notes (Addendum)
This is a 72 year old male who developed borderline low blood pressure earlier tonight.  Patient with symptomatic dizziness.  BP 100/57 sitting, afebrile. Unable to do orthostatics as patient is too dizzy on standing.  Total 500 cc NS bolus given, patient remains hypotensive 96/50.  Blood cultures x 2, CXR ordered.  CXR right-sided pleural effusion.  CT chest shows pneumonia (my interpretation).  Midodrine 5 mg given, patient's blood pressure improved to 114/54.  Antibiotics adjusted to Zosyn and doxycycline added. Patient with mild gastroenteritis is nausea and diarrhea. Compazine added  Troponin elevated at 122, exact troponin level in 02/2022. EKG RBBB

## 2023-02-20 NOTE — Progress Notes (Signed)
Dear Doctor: [name] This patient has been identified as a candidate for PICC for the following reason (s): poor veins/poor circulatory system (CHF, COPD, emphysema, diabetes, steroid use, IV drug abuse, etc.) If you agree, please write an order for the indicated device. For any questions contact the Vascular Access Team at [phone] if no answer, please leave a message.  Thank you for supporting the early vascular access assessment program.

## 2023-02-20 NOTE — Progress Notes (Signed)
Received order for PICC placement. Pt currently on dialysis.  Salome Holmes, NP ok to proceed with PICC placement.

## 2023-02-20 NOTE — Plan of Care (Signed)

## 2023-02-20 NOTE — Evaluation (Signed)
Clinical/Bedside Swallow Evaluation Patient Details  Name: [name] MRN: [id number] Date of Birth: 06/14/1951  Today's Date: 02/20/2023 Time: SLP Start Time (ACUTE ONLY): 4696 SLP Stop Time (ACUTE ONLY): 0950 SLP Time Calculation (min) (ACUTE ONLY): 13 min  Past Medical History:  Past Medical History:  Diagnosis Date   Arthritis    CAD (coronary artery disease)    STEMI with LAD stent by [facility] in 2005   Dependence on renal dialysis (HCC)    Depression    Diabetes mellitus without complication (HCC)    History of kidney stones    Hypertension    Kidney infection    MI (mitral incompetence)    Myocardial infarction (HCC)    Seasonal allergies    Past Surgical History:  Past Surgical History:  Procedure Laterality Date   AV FISTULA PLACEMENT Left 01/30/2014   Procedure: INSERTION OF ARTERIOVENOUS (AV) GORE-TEX GRAFT LEFT UPPER ARM;  Surgeon: Sherren Kerns, MD;  Location: [facility] OR;  Service: Vascular;  Laterality: Left;   BACK SURGERY     CARDIAC CATHETERIZATION N/A 04/01/2015   Procedure: Right/Left Heart Cath and Coronary Angiography;  Surgeon: Marykay Lex, MD;  Location: [facility] INVASIVE CV LAB;  Service: Cardiovascular;  Laterality: N/A;   COLONOSCOPY N/A 10/12/2016   Procedure: COLONOSCOPY;  Surgeon: Corbin Ade, MD;  Location: [facility] ENDO SUITE;  Service: Endoscopy;  Laterality: N/A;  2:00pm   COLONOSCOPY W/ POLYPECTOMY     CORONARY ANGIOPLASTY WITH STENT PLACEMENT  2008   CORONARY ARTERY BYPASS GRAFT N/A 04/03/2015   Procedure: CORONARY ARTERY BYPASS GRAFTING (CABG) x  4 (LIMA to LAD, SVG to DIAGONAL, SVG to OM1, and SVG to RCA) with EVH from right greater saphenous thigh and partial lower leg vein ;  Surgeon: Kerin Perna, MD;  Location: [facility] OR;  Service: Open Heart Surgery;  Laterality: N/A;   EYE SURGERY Right    lens implant   INSERTION OF DIALYSIS CATHETER Right 01/30/2014   Procedure: INSERTION OF DIALYSIS CATHETER-RIGHT INTERNAL JUGULAR;  Surgeon: Sherren Kerns, MD;  Location: [facility] OR;  Service: Vascular;  Laterality: Right;   IR RADIOLOGIST EVAL & MGMT  12/30/2020   IR RADIOLOGIST EVAL & MGMT  01/08/2021   IR THORACENTESIS ASP PLEURAL SPACE W/IMG GUIDE  03/26/2022   POLYPECTOMY  10/12/2016   Procedure: POLYPECTOMY;  Surgeon: Corbin Ade, MD;  Location: [facility] ENDO SUITE;  Service: Endoscopy;;  colon   SPINE SURGERY     TEE WITHOUT CARDIOVERSION N/A 04/03/2015   Procedure: TRANSESOPHAGEAL ECHOCARDIOGRAM (TEE);  Surgeon: Kerin Perna, MD;  Location: [facility] OR;  Service: Open Heart Surgery;  Laterality: N/A;   HPI:  72 yo male with onset of sudden development of gangrene to L foot was admitted 9/5, now awaiting vasc surgery procedure on 9/10 for angiogram and potential amputation.   Chest CT potentially suggestive of new pneumonia. PMHx:  ESRD HD, DM, CAD, CABG, HTN, HLD,    Assessment / Plan / Recommendation  Clinical Impression  Patient presents with normal and function swallowing abilities. No overt indication of aspiration observed. No SLP f/u indicated. SLP Visit Diagnosis: Dysphagia, unspecified (R13.10)       Diet Recommendation Regular;Thin liquid    Liquid Administration via: Straw;Cup Medication Administration: Whole meds with liquid Supervision: Patient able to self feed Compensations: Small sips/bites;Slow rate Postural Changes: Seated upright at 90 degrees    Other  Recommendations Oral Care Recommendations: Oral care BID    Recommendations for follow up  therapy are one component of a multi-disciplinary discharge planning process, led by the attending physician.  Recommendations may be updated based on patient status, additional functional criteria and insurance authorization.  Follow up Recommendations No SLP follow up         Functional Status Assessment Patient has not had a recent decline in their functional status          Swallow Study   General HPI: 72 yo male with onset of sudden development of gangrene to L foot  was admitted 9/5, now awaiting vasc surgery procedure on 9/10 for angiogram and potential amputation.   Chest CT potentially suggestive of new pneumonia. PMHx:  ESRD HD, DM, CAD, CABG, HTN, HLD, Type of Study: Bedside Swallow Evaluation Previous Swallow Assessment: none Diet Prior to this Study: Regular;Thin liquids (Level 0) Temperature Spikes Noted: No Respiratory Status: Room air History of Recent Intubation: No Behavior/Cognition: Cooperative;Alert;Pleasant mood Oral Cavity Assessment: Within Functional Limits Oral Care Completed by SLP: No Oral Cavity - Dentition: Edentulous;Dentures, not available Vision: Functional for self-feeding Self-Feeding Abilities: Able to feed self Patient Positioning: Upright in bed Baseline Vocal Quality: Normal Volitional Cough: Strong Volitional Swallow: Able to elicit    Oral/Motor/Sensory Function Overall Oral Motor/Sensory Function: Within functional limits   Ice Chips Ice chips: Not tested   Thin Liquid Thin Liquid: Within functional limits Presentation: Self Fed;Straw    Nectar Thick Nectar Thick Liquid: Not tested   Honey Thick Honey Thick Liquid: Not tested   Puree Puree: Not tested   Solid     Solid: Within functional limits Presentation: Self Fed     Arshia Rondon MA, CCC-SLP  Emalene Welte Meryl 02/20/2023,9:52 AM

## 2023-02-20 NOTE — Progress Notes (Addendum)
PROGRESS NOTE        PATIENT DETAILS Name: [name] Age: 72 y.o. Sex: male Date of Birth: 12-22-1950 Admit Date: 02/17/2023 Admitting Physician Catarina Hartshorn, MD PCP:[facility]  Brief Summary: Patient is a 72 y.o.  male with history of ESRD on HD MWF, DM-2, HTN, CAD s/p CABG-who presented [facility] ED with left diabetic foot with gangrenous appearing toes-patient was transferred to [facility] for Ortho/vascular surgery evaluation.  Significant events: 9/5>> admit to [facility]-transfer to [facility] from [facility]  Significant studies: 9/5>> x-ray left foot: There is soft tissue thickening of third, fourth, fifth toes-possible associated acro-osteolysis.  Significant microbiology data: None  Procedures: ABI -   Right: Resting right ankle-brachial index indicates moderate right lower extremity arterial disease. The right toe-brachial index is absent. Left: Resting left ankle-brachial index indicates severe left lower extremity arterial disease. The left toe-brachial index is absent.  Consults: Vascular surgery Orthopedics  Subjective:   Patient in bed, appears comfortable, denies any headache, no fever, no chest pain or pressure, no shortness of breath , no abdominal pain. No focal weakness.   Objective: Vitals: Blood pressure 114/60, pulse 86, temperature 97.6 F (36.4 C), temperature source Oral, resp. rate 20, height 6\' 5"  (1.956 m), weight 81.7 kg, SpO2 95%.   Exam:  Awake Alert, No new F.N deficits, Normal affect [place].AT,PERRAL Supple Neck, No JVD,   Symmetrical Chest wall movement, Good air movement bilaterally, CTAB RRR,No Gallops, Rubs or new Murmurs,  +ve B.Sounds, Abd Soft, No tenderness,   Several necrotic looking toes in the left foot.   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  Blood cultures drawn and are pending, inflammatory markers are stable as before, ental IV fluid given, blood pressure medications adjusted.  He is stable and nontoxic.  Continue to monitor.  Antibiotics have been broadened from vancomycin to vancomycin plus Zosyn on 02/20/2023.  Follow cultures and monitor clinically.   ESRD on HD MWF Nephrology following, discussed with nephrology on 02/20/2023.  Nonspecific renal lesions noted on CT scan incidentally.  Outpatient urology follow-up, to be arranged by PCP postdischarge.    Mildly elevated troponin.  Monitor trend.  No left-sided chest pain, EKG unchanged, on aspirin and statin, low-dose beta-blocker if he can tolerate, has history of CAD in the past, echo ordered and pending.  Will monitor.  Chest pain-free.   History of CAD s/p CABG No anginal symptoms Aspirin/statin  HTN BP stable Continue verapamil/hydralazine/minoxidil  HLD Statin  DM-2 (A1c 8.4 on 9/5) - Started SSI-follow CBG trend-and start long-acting insulin if needed, DM education  CBG (last 3)  Recent Labs    02/19/23 2125 02/20/23 0145 02/20/23 0828  GLUCAP 188* 197* 288*   Lab Results  Component Value Date   HGBA1C 8.4 (H) 02/17/2023     Code status:   Code Status: Full Code   DVT Prophylaxis: heparin injection 5,000 Units Start: 02/17/23 2200   Family Communication: None at bedside  Disposition Plan: Status is: Inpatient Remains inpatient appropriate because: Severity of illness   Planned Discharge Destination:Home health   Diet:  Diet Order             Diet Carb Modified Fluid consistency: Thin; Room service appropriate? Yes  Diet effective now                    MEDICATIONS: Scheduled Meds:   amitriptyline  25 mg Oral QHS   aspirin EC  325 mg Oral Once per day on Sunday Tuesday Thursday Saturday   atorvastatin  80 mg Oral q1800   brimonidine  1 drop Both Eyes q AM   Chlorhexidine Gluconate Cloth  6 each Topical Q0600   heparin  5,000 Units Subcutaneous Q8H   influenza vaccine adjuvanted  0.5 mL Intramuscular Tomorrow-1000   insulin aspart  0-9 Units Subcutaneous TID WC   latanoprost  2 drop Both Eyes BID   metoprolol tartrate  25 mg Oral BID   sevelamer carbonate  800 mg Oral TID WC   Continuous Infusions:  lactated ringers     piperacillin-tazobactam (ZOSYN)  IV     vancomycin     PRN Meds:.acetaminophen **OR** acetaminophen, melatonin, ondansetron **OR** ondansetron (ZOFRAN) IV, prochlorperazine   I have personally reviewed following labs and imaging studies  LABORATORY DATA:  Recent Labs  Lab 02/17/23 1253 02/18/23 0534 02/19/23 0651 02/20/23 0632  WBC 15.2* 11.6* 11.7* 19.5*  HGB 13.0 12.7* 13.4 11.7*  HCT 42.4 40.4 43.1 38.9*  PLT 279 254 299 303  MCV 96.4 96.4 93.1 97.5  MCH 29.5 30.3 28.9 29.3  MCHC 30.7 31.4 31.1 30.1  RDW 17.1* 17.0* 17.0* 17.2*  LYMPHSABS 0.8  --  0.7 0.7  MONOABS 1.0  --  0.8 1.2*  EOSABS 0.1  --  0.1 0.0  BASOSABS 0.0  --  0.0 0.0    Recent Labs  Lab 02/17/23 1253 02/17/23 2034 02/18/23 0534 02/19/23 0651 02/20/23 0632 02/20/23 0646  NA 130*  --  132* 134*  --   --   K 4.4  --  5.1 4.8  --   --   CL 86*  --  89* 91*  --   --   CO2 25  --  25 27  --   --   ANIONGAP 19*  --  18* 16*  --   --   GLUCOSE 112*  --  153* 131*  --   --   BUN 52*  --  63* 36*  --   --   CREATININE 6.80*  --  7.78* 5.82*  --   --   ALBUMIN  --   --  2.3* 2.5*  --   --   CRP  --  22.7*  --  23.7* 21.5*  --   PROCALCITON  --   --   --  2.29 2.24  --   LATICACIDVEN  --   --   --   --  5.2*  --   HGBA1C  --  8.4*  --   --   --   --   AMMONIA  --   --   --  15 30  --   BNP  --   --   --  2,618.1*  --  1,153.5*  MG  --   --   --  2.1 2.4  --    CALCIUM 8.8*  --  8.8* 9.2  --   --     Lab Results  Component Value Date   CHOL 210 (H) 04/01/2015   HDL 38 (L) 04/01/2015   LDLCALC  158 (H) 04/01/2015   TRIG 201.0 (H) 03/16/2022   CHOLHDL 5.5 04/01/2015      Recent Labs  Lab 02/17/23 1253 02/17/23 2034 02/18/23 0534 02/19/23 0651 02/20/23 0632 02/20/23 0646  CRP  --  22.7*  --  23.7* 21.5*  --   PROCALCITON  --   --   --  2.29 2.24  --   LATICACIDVEN  --   --   --   --  5.2*  --   HGBA1C  --  8.4*  --   --   --   --   AMMONIA  --   --   --  15 30  --   BNP  --   --   --  2,618.1*  --  1,153.5*  MG  --   --   --  2.1 2.4  --   CALCIUM 8.8*  --  8.8* 9.2  --   --    RADIOLOGY STUDIES/RESULTS: CT CHEST WO CONTRAST  Result Date: 02/20/2023 CLINICAL DATA:  72 year old male with history of hypoxia and pleural effusion. Chest pain. Suspected malignancy. EXAM: CT CHEST WITHOUT CONTRAST TECHNIQUE: Multidetector CT imaging of the chest was performed following the standard protocol without IV contrast. RADIATION DOSE REDUCTION: This exam was performed according to the departmental dose-optimization program which includes automated exposure control, adjustment of the mA and/or kV according to patient size and/or use of iterative reconstruction technique. COMPARISON:  Chest CT 03/26/2012. FINDINGS: Cardiovascular: Heart size is enlarged. There is no significant pericardial fluid, thickening or pericardial calcification. There is aortic atherosclerosis, as well as atherosclerosis of the great vessels of the mediastinum and the coronary arteries, including calcified atherosclerotic plaque in the left main, left anterior descending, left circumflex and right coronary arteries. Status post median sternotomy for CABG including LIMA to the LAD. Severely calcified aortic valve. Mediastinum/Nodes: No pathologically enlarged mediastinal or hilar lymph nodes. Please note that accurate exclusion of hilar adenopathy is limited on noncontrast CT scans.  Esophagus is unremarkable in appearance. No axillary lymphadenopathy. Lungs/Pleura: Small right pleural effusion with what appears to be extensive pleural thickening (poorly evaluated on today's noncontrast CT examination), but similar to the prior study from 03/26/2022. Areas of architectural distortion in the right lung, most evident in the posterior aspect of the right lower lobe, likely to reflect areas of rounded atelectasis. Linear scarring in the left lower lobe. Left lung is otherwise clear. No left pleural effusion. No definite suspicious appearing pulmonary nodules or masses are otherwise noted. Upper Abdomen: Aortic atherosclerosis. Numerous bilateral renal lesions ranging from low to intermediate to high attenuation, most concerning of which is a partially imaged exophytic 1.4 cm high attenuation lesion in the posteromedial aspect of the interpolar region of the left kidney (axial image 188 of series 3), increased in size compared to prior examinations. Extensive atherosclerosis. Musculoskeletal: Median sternotomy wires. There are no aggressive appearing lytic or blastic lesions noted in the visualized portions of the skeleton. IMPRESSION: 1. Chronic right pleural effusion with probable rounded atelectasis in the right lower lobe, similar to the prior examination. 2. Numerous renal lesions bilaterally, indeterminate on today's study. Further evaluation with nonemergent abdominal MRI with and without IV gadolinium is recommended in the near future to provide definitive characterization and exclude neoplasm. 3. Aortic atherosclerosis, in addition to left main and three-vessel coronary artery disease. Status post median sternotomy for CABG including LIMA to the LAD. 4. There are severe calcifications of the aortic valve. Echocardiographic correlation for evaluation of  potential valvular dysfunction may be warranted if clinically indicated. Aortic Atherosclerosis (ICD10-I70.0). Electronically Signed   By:  Trudie Reed M.D.   On: 02/20/2023 09:51   DG CHEST PORT 1 VIEW  Result Date: 02/20/2023 CLINICAL DATA:  72 year old male with history of shortness of breath. EXAM: PORTABLE CHEST 1 VIEW COMPARISON:  Chest x-ray 05/18/2022. FINDINGS: Elevation of the right hemidiaphragm redemonstrated. Architectural distortion and irregular opacities in the right mid to lower lung, which may reflect areas of atelectasis and/or scarring, although underlying airspace consolidation is not excluded. Small right pleural effusion. Left lung is clear. No left pleural effusion. No pneumothorax. No evidence of pulmonary edema. Heart size is mildly enlarged. Upper mediastinal contours are distorted by patient's rotation to the right. Atherosclerotic calcifications are noted in the thoracic aorta. Status post median sternotomy for CABG. IMPRESSION: 1. Elevation of the right hemidiaphragm with probable atelectasis and/or scarring in the right lung base and small right pleural effusion. Underlying airspace disease in this region is not excluded, but not favored. 2. Aortic atherosclerosis. 3. Mild cardiomegaly. Electronically Signed   By: Trudie Reed M.D.   On: 02/20/2023 05:11     LOS: 3 days   Signature  -    Susa Raring M.D on 02/20/2023 at 9:58 AM   -  To page go to [email or web address]

## 2023-02-20 NOTE — Consult Note (Signed)
Cardiology Consultation   Patient ID: [name] MRN: [id number]; DOB: September 14, 1950  Admit date: 02/17/2023 Date of Consult: 02/20/2023  PCP:  [facility]   [facility] Providers Cardiologist:  Eden Emms '2016   Patient Profile:   [name] is a 72 y.o. male with a hx of CAD status post PCI to LAD '06, 4v CABG (LIMA to LAD, SVG to diagonal, SVG to OM1, SVG to RCA)'16, hypertension, hyperlipidemia, ESRD on HD who is being seen 02/20/2023 for the evaluation of elevated troponin at the request of [name].  History of Present Illness:   [name] is a 72 year old male with past medical history noted above.  Underwent PCI to LAD in 2006 at [facility] and was followed by [facility] until 2013.  Notes indicate he had a nuclear stress test which revealed ischemia with scar involving apical lateral, basal anterior lateral and mid anterolateral segments 03/2012.  In 03/2015 he underwent four-vessel CABG with LIMA to LAD, SVG to diagonal, SVG to OM1 and SVG to RCA.  Did well postoperatively.  Echo showed LVEF of 55 to 60%, no regional wall motion abnormality, grade 2 diastolic dysfunction.  He was seen back in the office 1 month later with Dillon Boozer, [name] and reported issues with his blood pressures dropping.  Apparently was taking Lopressor and Coreg.  He was instructed to stop his Lopressor.    Reports he has been following with cardiology at [facility], Dr. Jaymes Graff. Last seen in the office on 10/2022 and reported being in his usual state health.  Reported he was only intermittently taking aspirin, no statin, minoxidil 10 mg daily and was started on verapamil 120 mg daily on nondialysis days.   Presented to [name] the ED on 9/5 with complaints of left diabetic foot ulcer with gangrenous appearing toes.  He was transferred to [facility] for further evaluation with orthopedics/vascular surgery.  Left foot x-ray with soft tissues thickening of third,  fourth and fifth toes.  Underwent ABIs with right indicating moderate lower extremity arterial disease with right TBI being absent, left ABI indicating severe lower extremity arterial disease with TBI absent.  He was seen by vascular, [name] with plans for abdominal aortogram on Tuesday.  There is concern that he will require a TMA at minimal but possibly higher depending on his angiogram findings.   Cardiology has been consulted in regards to his elevated troponins 122>>245>> 1813.  EKG with baseline right bundle branch block.  Patient denies any chest pain or shortness of breath prior to admission.  Past Medical History:  Diagnosis Date   Arthritis    CAD (coronary artery disease)    STEMI with LAD stent by [facility] in 2005   Dependence on renal dialysis (HCC)    Depression    Diabetes mellitus without complication (HCC)    History of kidney stones    Hypertension    Kidney infection    MI (mitral incompetence)    Myocardial infarction (HCC)    Seasonal allergies     Past Surgical History:  Procedure Laterality Date   AV FISTULA PLACEMENT Left 01/30/2014   Procedure: INSERTION OF ARTERIOVENOUS (AV) GORE-TEX GRAFT LEFT UPPER ARM;  Surgeon: Sherren Kerns, MD;  Location: [facility] OR;  Service: Vascular;  Laterality: Left;   BACK SURGERY     CARDIAC CATHETERIZATION N/A 04/01/2015   Procedure: Right/Left Heart Cath and Coronary Angiography;  Surgeon: Marykay Lex, MD;  Location: [facility] INVASIVE  CV LAB;  Service: Cardiovascular;  Laterality: N/A;   COLONOSCOPY N/A 10/12/2016   Procedure: COLONOSCOPY;  Surgeon: Corbin Ade, MD;  Location: [facility] ENDO SUITE;  Service: Endoscopy;  Laterality: N/A;  2:00pm   COLONOSCOPY W/ POLYPECTOMY     CORONARY ANGIOPLASTY WITH STENT PLACEMENT  2008   CORONARY ARTERY BYPASS GRAFT N/A 04/03/2015   Procedure: CORONARY ARTERY BYPASS GRAFTING (CABG) x  4 (LIMA to LAD, SVG to DIAGONAL, SVG to OM1, and SVG to RCA) with EVH from right greater saphenous thigh and  partial lower leg vein ;  Surgeon: Kerin Perna, MD;  Location: [facility] OR;  Service: Open Heart Surgery;  Laterality: N/A;   EYE SURGERY Right    lens implant   INSERTION OF DIALYSIS CATHETER Right 01/30/2014   Procedure: INSERTION OF DIALYSIS CATHETER-RIGHT INTERNAL JUGULAR;  Surgeon: Sherren Kerns, MD;  Location: [facility] OR;  Service: Vascular;  Laterality: Right;   IR RADIOLOGIST EVAL & MGMT  12/30/2020   IR RADIOLOGIST EVAL & MGMT  01/08/2021   IR THORACENTESIS ASP PLEURAL SPACE W/IMG GUIDE  03/26/2022   POLYPECTOMY  10/12/2016   Procedure: POLYPECTOMY;  Surgeon: Corbin Ade, MD;  Location: [facility] ENDO SUITE;  Service: Endoscopy;;  colon   SPINE SURGERY     TEE WITHOUT CARDIOVERSION N/A 04/03/2015   Procedure: TRANSESOPHAGEAL ECHOCARDIOGRAM (TEE);  Surgeon: Kerin Perna, MD;  Location: [facility] OR;  Service: Open Heart Surgery;  Laterality: N/A;    Inpatient Medications: Scheduled Meds:  amitriptyline  25 mg Oral QHS   aspirin EC  325 mg Oral Once per day on Sunday Tuesday Thursday Saturday   atorvastatin  80 mg Oral q1800   brimonidine  1 drop Both Eyes q AM   Chlorhexidine Gluconate Cloth  6 each Topical Q0600   heparin  5,000 Units Subcutaneous Q8H   influenza vaccine adjuvanted  0.5 mL Intramuscular Tomorrow-1000   insulin aspart  0-9 Units Subcutaneous TID WC   latanoprost  2 drop Both Eyes BID   loperamide  4 mg Oral Once   metoprolol tartrate  25 mg Oral BID   sevelamer carbonate  800 mg Oral TID WC   Continuous Infusions:  lactated ringers     piperacillin-tazobactam (ZOSYN)  IV     vancomycin     vancomycin     PRN Meds: acetaminophen **OR** acetaminophen, melatonin, ondansetron **OR** ondansetron (ZOFRAN) IV, prochlorperazine  Allergies:    Allergies  Allergen Reactions   Shellfish Allergy Hives    Social History:   Social History   Socioeconomic History   Marital status: Widowed    Spouse name: Not on file   Number of children: Not on file   Years of education:  Not on file   Highest education level: Not on file  Occupational History   Not on file  Tobacco Use   Smoking status: Never   Smokeless tobacco: Former    Types: Chew    Quit date: 06/14/2010  Vaping Use   Vaping status: Never Used  Substance and Sexual Activity   Alcohol use: No   Drug use: No   Sexual activity: Not on file  Other Topics Concern   Not on file  Social History Narrative   Not on file   Social Determinants of Health   Financial Resource Strain: Not on file  Food Insecurity: No Food Insecurity (02/17/2023)   Hunger Vital Sign    Worried About Running Out of Food in the Last Year: Never true  Ran Out of Food in the Last Year: Never true  Transportation Needs: No Transportation Needs (02/17/2023)   PRAPARE - Administrator, Civil Service (Medical): No    Lack of Transportation (Non-Medical): No  Physical Activity: Not on file  Stress: Not on file  Social Connections: Not on file  Intimate Partner Violence: Not At Risk (02/17/2023)   Humiliation, Afraid, Rape, and Kick questionnaire    Fear of Current or Ex-Partner: No    Emotionally Abused: No    Physically Abused: No    Sexually Abused: No    Family History:    Family History  Problem Relation Age of Onset   Diabetes Mother    Hypertension Mother    Diabetes Father    Hypertension Father    Diabetes Other      ROS:  Please see the history of present illness.   All other ROS reviewed and negative.     Physical Exam/Data:   Vitals:   02/20/23 0325 02/20/23 0434 02/20/23 0512 02/20/23 0800  BP: (!) 96/50 (!) 92/50 114/60   Pulse: 79 79 86   Resp: 17 20 20 20   Temp:  97.6 F (36.4 C)    TempSrc:  Oral    SpO2: 98% 93% 95%   Weight:      Height:        Intake/Output Summary (Last 24 hours) at 02/20/2023 1152 Last data filed at 02/19/2023 1800 Gross per 24 hour  Intake 520 ml  Output 0 ml  Net 520 ml      02/19/2023    4:25 AM 02/19/2023   12:37 AM 02/17/2023    3:04 PM  Last 3  Weights  Weight (lbs) 180 lb 1.9 oz 180 lb 8.9 oz 190 lb  Weight (kg) 81.7 kg 81.9 kg 86.183 kg     Body mass index is 21.36 kg/m.  General:  Well nourished, well developed, in no acute distress HEENT: normal Neck: no JVD Vascular: No carotid bruits; Distal pulses 2+ bilaterally Cardiac:  normal S1, S2; RRR; + systolic murmur  Lungs:  clear to auscultation bilaterally, no wheezing, rhonchi or rales  Abd: soft, non-tender Ext: Necrotic toes on left foot Musculoskeletal:  No deformities, BUE and BLE strength normal and equal Skin: warm and dry  Neuro:  CNs 2-12 intact, no focal abnormalities noted Psych:  Normal affect   EKG:  The EKG was personally reviewed and demonstrates: Sinus rhythm, 81 bpm, right bundle branch block  Relevant CV Studies:  Echocardiogram: 03/2022  IMPRESSIONS     1. Left ventricular ejection fraction, by estimation, is 60 to 65%. The  left ventricle has normal function. The left ventricle has no regional  wall motion abnormalities. There is mild concentric left ventricular  hypertrophy. Left ventricular diastolic  parameters are consistent with Grade II diastolic dysfunction  (pseudonormalization).   2. Right ventricular systolic function is mildly reduced. The right  ventricular size is normal. There is moderately elevated pulmonary artery  systolic pressure. The estimated right ventricular systolic pressure is  58.1 mmHg.   3. Left atrial size was mildly dilated.   4. Right atrial size was mildly dilated.   5. The mitral valve is normal in structure. Trivial mitral valve  regurgitation. No evidence of mitral stenosis.   6. Tricuspid valve regurgitation is moderate.   7. The aortic valve is tricuspid. There is moderate calcification of the  aortic valve. Aortic valve regurgitation is not visualized. Mild aortic  valve stenosis.  8. The inferior vena cava is normal in size with greater than 50%  respiratory variability, suggesting right atrial  pressure of 3 mmHg.   FINDINGS   Left Ventricle: Left ventricular ejection fraction, by estimation, is 60  to 65%. The left ventricle has normal function. The left ventricle has no  regional wall motion abnormalities. The left ventricular internal cavity  size was normal in size. There is   mild concentric left ventricular hypertrophy. Left ventricular diastolic  parameters are consistent with Grade II diastolic dysfunction  (pseudonormalization).   Right Ventricle: Mildly D-shaped interventricular septum suggestive of RV  pressure/volume overload. The right ventricular size is normal. No  increase in right ventricular wall thickness. Right ventricular systolic  function is mildly reduced. There is  moderately elevated pulmonary artery systolic pressure. The tricuspid  regurgitant velocity is 3.71 m/s, and with an assumed right atrial  pressure of 3 mmHg, the estimated right ventricular systolic pressure is  58.1 mmHg.   Left Atrium: Left atrial size was mildly dilated.   Right Atrium: Right atrial size was mildly dilated.   Pericardium: There is no evidence of pericardial effusion.   Mitral Valve: The mitral valve is normal in structure. There is mild  calcification of the mitral valve leaflet(s). Mild to moderate mitral  annular calcification. Trivial mitral valve regurgitation. No evidence of  mitral valve stenosis.   Tricuspid Valve: The tricuspid valve is normal in structure. Tricuspid  valve regurgitation is moderate.   Aortic Valve: The aortic valve is tricuspid. There is moderate  calcification of the aortic valve. Aortic valve regurgitation is not  visualized. Mild aortic stenosis is present. Aortic valve mean gradient  measures 11.0 mmHg.   Pulmonic Valve: The pulmonic valve was normal in structure. Pulmonic valve  regurgitation is trivial.   Aorta: The aortic root is normal in size and structure.   Venous: The inferior vena cava is normal in size with greater  than 50%  respiratory variability, suggesting right atrial pressure of 3 mmHg.   IAS/Shunts: No atrial level shunt detected by color flow Doppler.    Laboratory Data:  High Sensitivity Troponin:   Recent Labs  Lab 02/20/23 0431 02/20/23 0632 02/20/23 0941  TROPONINIHS 122* 245* 1,813*     Chemistry Recent Labs  Lab 02/17/23 1253 02/18/23 0534 02/19/23 0651 02/20/23 0632  NA 130* 132* 134*  --   K 4.4 5.1 4.8  --   CL 86* 89* 91*  --   CO2 25 25 27   --   GLUCOSE 112* 153* 131*  --   BUN 52* 63* 36*  --   CREATININE 6.80* 7.78* 5.82*  --   CALCIUM 8.8* 8.8* 9.2  --   MG  --   --  2.1 2.4  GFRNONAA 8* 7* 10*  --   ANIONGAP 19* 18* 16*  --     Recent Labs  Lab 02/18/23 0534 02/19/23 0651  ALBUMIN 2.3* 2.5*   Lipids No results for input(s): "CHOL", "TRIG", "HDL", "LABVLDL", "LDLCALC", "CHOLHDL" in the last 168 hours.  Hematology Recent Labs  Lab 02/18/23 0534 02/19/23 0651 02/20/23 0632  WBC 11.6* 11.7* 19.5*  RBC 4.19* 4.63 3.99*  HGB 12.7* 13.4 11.7*  HCT 40.4 43.1 38.9*  MCV 96.4 93.1 97.5  MCH 30.3 28.9 29.3  MCHC 31.4 31.1 30.1  RDW 17.0* 17.0* 17.2*  PLT 254 299 303   Thyroid No results for input(s): "TSH", "FREET4" in the last 168 hours.  BNP Recent Labs  Lab  02/19/23 0651 02/20/23 0646  BNP 2,618.1* 1,153.5*    DDimer No results for input(s): "DDIMER" in the last 168 hours.   Radiology/Studies:  VAS [place] ABI WITH/WO TBI  Result Date: 02/20/2023  LOWER EXTREMITY DOPPLER STUDY Patient Name:  MARQUIN HAMMER  Date of Exam:   02/18/2023 Medical Rec #: [id number]        Accession #:    [id number] Date of Birth: [birth year]        Patient Gender: M Patient Age:   21 years Exam Location:  [facility] Procedure:      VAS [place] ABI WITH/WO TBI Referring Phys: [facility] --------------------------------------------------------------------------------  Indications: Gangrene. High Risk Factors: Hypertension, hyperlipidemia, Diabetes, past history of                     smoking, prior MI, coronary artery disease. Other Factors: ESRD (HD), HX CABG.  Comparison Study: Previous exam (Pre-CABG) on 03/30/2015 bilateral mild resting                   ischemia Performing Technologist: Hill, [name] RVT, RDMS  Examination Guidelines: A complete evaluation includes at minimum, Doppler waveform signals and systolic blood pressure reading at the level of bilateral brachial, anterior tibial, and posterior tibial arteries, when vessel segments are accessible. Bilateral testing is considered an integral part of a complete examination. Photoelectric Plethysmograph (PPG) waveforms and toe systolic pressure readings are included as required and additional duplex testing as needed. Limited examinations for reoccurring indications may be performed as noted.  ABI Findings: +---------+------------------+-----+----------+--------+ Right    Rt Pressure (mmHg)IndexWaveform  Comment  +---------+------------------+-----+----------+--------+ Brachial 147                    triphasic          +---------+------------------+-----+----------+--------+ PTA      80                0.54 monophasic         +---------+------------------+-----+----------+--------+ DP       83                0.56 monophasic         +---------+------------------+-----+----------+--------+ Great Toe0                 0.00 Absent             +---------+------------------+-----+----------+--------+ +---------+------------------+-----+----------+-------+ Left     Lt Pressure (mmHg)IndexWaveform  Comment +---------+------------------+-----+----------+-------+ Brachial                                  HD      +---------+------------------+-----+----------+-------+ PTA      75                0.51 monophasic        +---------+------------------+-----+----------+-------+ DP       0                 0.00 absent            +---------+------------------+-----+----------+-------+ Great  Toe0                 0.00 Absent            +---------+------------------+-----+----------+-------+ +-------+-----------+-----------+------------+------------+ ABI/TBIToday's ABIToday's TBIPrevious ABIPrevious TBI +-------+-----------+-----------+------------+------------+ Right  0.56       Absent     0.82                     +-------+-----------+-----------+------------+------------+  Left   0.51       Absent     0.83                     +-------+-----------+-----------+------------+------------+  Summary: Right: Resting right ankle-brachial index indicates moderate right lower extremity arterial disease. The right toe-brachial index is absent. Left: Resting left ankle-brachial index indicates severe left lower extremity arterial disease. The left toe-brachial index is absent. *See table(s) above for measurements and observations.  Electronically signed by Heath Lark on 02/20/2023 at 11:03:06 AM.    Final    CT CHEST WO CONTRAST  Result Date: 02/20/2023 CLINICAL DATA:  72 year old male with history of hypoxia and pleural effusion. Chest pain. Suspected malignancy. EXAM: CT CHEST WITHOUT CONTRAST TECHNIQUE: Multidetector CT imaging of the chest was performed following the standard protocol without IV contrast. RADIATION DOSE REDUCTION: This exam was performed according to the departmental dose-optimization program which includes automated exposure control, adjustment of the mA and/or kV according to patient size and/or use of iterative reconstruction technique. COMPARISON:  Chest CT 03/26/2012. FINDINGS: Cardiovascular: Heart size is enlarged. There is no significant pericardial fluid, thickening or pericardial calcification. There is aortic atherosclerosis, as well as atherosclerosis of the great vessels of the mediastinum and the coronary arteries, including calcified atherosclerotic plaque in the left main, left anterior descending, left circumflex and right coronary arteries. Status  post median sternotomy for CABG including LIMA to the LAD. Severely calcified aortic valve. Mediastinum/Nodes: No pathologically enlarged mediastinal or hilar lymph nodes. Please note that accurate exclusion of hilar adenopathy is limited on noncontrast CT scans. Esophagus is unremarkable in appearance. No axillary lymphadenopathy. Lungs/Pleura: Small right pleural effusion with what appears to be extensive pleural thickening (poorly evaluated on today's noncontrast CT examination), but similar to the prior study from 03/26/2022. Areas of architectural distortion in the right lung, most evident in the posterior aspect of the right lower lobe, likely to reflect areas of rounded atelectasis. Linear scarring in the left lower lobe. Left lung is otherwise clear. No left pleural effusion. No definite suspicious appearing pulmonary nodules or masses are otherwise noted. Upper Abdomen: Aortic atherosclerosis. Numerous bilateral renal lesions ranging from low to intermediate to high attenuation, most concerning of which is a partially imaged exophytic 1.4 cm high attenuation lesion in the posteromedial aspect of the interpolar region of the left kidney (axial image 188 of series 3), increased in size compared to prior examinations. Extensive atherosclerosis. Musculoskeletal: Median sternotomy wires. There are no aggressive appearing lytic or blastic lesions noted in the visualized portions of the skeleton. IMPRESSION: 1. Chronic right pleural effusion with probable rounded atelectasis in the right lower lobe, similar to the prior examination. 2. Numerous renal lesions bilaterally, indeterminate on today's study. Further evaluation with nonemergent abdominal MRI with and without IV gadolinium is recommended in the near future to provide definitive characterization and exclude neoplasm. 3. Aortic atherosclerosis, in addition to left main and three-vessel coronary artery disease. Status post median sternotomy for CABG  including LIMA to the LAD. 4. There are severe calcifications of the aortic valve. Echocardiographic correlation for evaluation of potential valvular dysfunction may be warranted if clinically indicated. Aortic Atherosclerosis (ICD10-I70.0). Electronically Signed   By: Trudie Reed M.D.   On: 02/20/2023 09:51   DG CHEST PORT 1 VIEW  Result Date: 02/20/2023 CLINICAL DATA:  72 year old male with history of shortness of breath. EXAM: PORTABLE CHEST 1 VIEW COMPARISON:  Chest x-ray 05/18/2022. FINDINGS: Elevation of the right hemidiaphragm redemonstrated. Architectural  distortion and irregular opacities in the right mid to lower lung, which may reflect areas of atelectasis and/or scarring, although underlying airspace consolidation is not excluded. Small right pleural effusion. Left lung is clear. No left pleural effusion. No pneumothorax. No evidence of pulmonary edema. Heart size is mildly enlarged. Upper mediastinal contours are distorted by patient's rotation to the right. Atherosclerotic calcifications are noted in the thoracic aorta. Status post median sternotomy for CABG. IMPRESSION: 1. Elevation of the right hemidiaphragm with probable atelectasis and/or scarring in the right lung base and small right pleural effusion. Underlying airspace disease in this region is not excluded, but not favored. 2. Aortic atherosclerosis. 3. Mild cardiomegaly. Electronically Signed   By: Trudie Reed M.D.   On: 02/20/2023 05:11   DG Foot Complete Left  Result Date: 02/17/2023 CLINICAL DATA:  Gangrene, infection. Toe pain for 1 week. Patient's 3rd, 4th and 5th toes are reported to be black. EXAM: LEFT FOOT - COMPLETE 3+ VIEW COMPARISON:  None Available. FINDINGS: The bones are diffusely demineralized. There is an old fracture of the 5th metatarsal. Possible chronic osteolysis of the distal tufts of the 2nd, 4th and 5th phalanges without evidence of acute bone destruction. The soft tissues of the 3rd, 4th and 5th toes  appear diffusely atretic, likely due to reported gangrene. No evidence of foreign body or soft tissue emphysema. Scattered vascular calcifications are noted. IMPRESSION: 1. Diffuse soft tissue thinning of the 3rd, 4th and 5th toes, likely due to reported gangrene. Possible mild associated acro-osteolysis. 2. No evidence of acute bone destruction to suggest osteomyelitis. Electronically Signed   By: Carey Bullocks M.D.   On: 02/17/2023 14:38     Assessment and Plan:   [name] is a 72 y.o. male with a hx of CAD status post PCI to LAD '06, 4v CABG (LIMA to LAD, SVG to diagonal, SVG to OM1, SVG to RCA)'16, hypertension, hyperlipidemia, ESRD on HD who is being seen 02/20/2023 for the evaluation of elevated troponin at the request of [name].  Elevated troponin CAD status post four-vessel CABG '16 -- Has been following with [facility] cardiology, at last office visit 10/2022 he reports he was not taking several of his medications.  Takes aspirin on a intermittent basis.  Denies any chest pain or shortness of breath prior to admission -- High-sensitivity troponin 122>>245>> 1813, elevated in the setting of acute illness and ESRD -- Echocardiogram pending -- He has not had an ischemic evaluation since his CABG in 2016, will review with MD. Suspect may need myoview given need for potential upcoming surgery  Left diabetic foot with gangrene -- Seen by vascular with plans for abdominal angiogram on Tuesday.  Will likely need TMA pending results -- Management per medicine  ESRD on HD -- On Monday Wednesday Friday schedule -- Per nephrology  Hypertension -- Reports blood pressure usually does well on on HD days, does have episodes of hypotension with dialysis -- Currently on verapamil, hydralazine and minoxidil  Murmur -- as above, check echo   For questions or updates, please contact [facility] Please consult [email or web address] for contact info under    Signed, Laverda Page, [name]   02/20/2023 11:52 AM

## 2023-02-20 NOTE — Progress Notes (Signed)
Pharmacy Antibiotic Note  [name] is a 72 y.o. male admitted on 02/17/2023 with  diabetic foot infection w/ gangrene .  Patient is ESRD w/ MWF dialysis schedule. Pharmacy has been consulted for vancomycin dosing.  Patient had last HD session 9/7 and received an additional dose of vancomycin. He is scheduled for next HD on 9/10. Of note, he did develop low BP overnight last night, so further workup was ordered. Chest CT potentially suggestive of new pneumonia and antibiotics were broadened.   Plan: Continue vancomycin 1000 mg IV every MWF w/ HD Continue to monitor HD schedule and obtain vancomycin levels as indicated  Monitor cultures and MRSA PCR  Follow clinical course and ability to narrow abx as able   Height: 6\' 5"  (195.6 cm) Weight: 81.7 kg (180 lb 1.9 oz) IBW/kg (Calculated) : 89.1  Temp (24hrs), Avg:98 F (36.7 C), Min:97.6 F (36.4 C), Max:98.3 F (36.8 C)  Recent Labs  Lab 02/17/23 1253 02/18/23 0534 02/19/23 0651 02/20/23 0632  WBC 15.2* 11.6* 11.7* 19.5*  CREATININE 6.80* 7.78* 5.82*  --   LATICACIDVEN  --   --   --  5.2*    Estimated Creatinine Clearance: 13.5 mL/min (A) (by C-G formula based on SCr of 5.82 mg/dL (H)).    Allergies  Allergen Reactions   Shellfish Allergy Hives    Antimicrobials this admission: Zosyn 9/8 >> Doxycycline 9/8 >> Vanco 9/5 >> CTX 9/5 >> 9/8  Microbiology results: 9/8 Bcx: pending  9/8 MRSA PCR: pending  Thank you for allowing pharmacy to be a part of this patient's care.  Lennie Muckle, PharmD PGY1 Pharmacy Resident 02/20/2023 8:13 AM

## 2023-02-20 NOTE — Progress Notes (Signed)
This pt's blood pressure is on lower side throughout the night. Pt complain of dizziness and has sweating with cold and clammy skin. Made MD aware. Give total 500 ml NS bolus, check CBG 197 mg/dl, offered 1 ounce orange juice . Pt has nausea with 1 episode of vomit, give Compazine and midodrine 5 mg for hypotension which then raised up to bp 110/60(76).

## 2023-02-20 NOTE — Progress Notes (Signed)
Pharmacy notified of iv infiltration in right forearm with abx vibramycin infusing last. No iv access for patient at this time.

## 2023-02-20 NOTE — Progress Notes (Signed)
Spoke with Dr, Arta Silence regarding PICC order. MD ordered for PICC to be cancelled at this time due to renal status.

## 2023-02-20 NOTE — Progress Notes (Addendum)
[facility] Progress Note   Subjective:    Seen and examined patient at bedside. Informed of patient becoming hypotensive overnight. He received LR bolus and 1x dose of Midodrine. Minoxidil and Hydralazine were stopped. Remains on Metoprolol. He reports feeling better and Bps are improving. Denies SOB, CP, and N/V. Next HD 9/9 and will watch his Bps closely.  Objective Vitals:   02/20/23 0325 02/20/23 0434 02/20/23 0512 02/20/23 0800  BP: (!) 96/50 (!) 92/50 114/60   Pulse: 79 79 86   Resp: 17 20 20 20   Temp:  97.6 F (36.4 C)    TempSrc:  Oral    SpO2: 98% 93% 95%   Weight:      Height:       Physical Exam General: Awake, alert, NAD, on RA Heart: S1 and S2; No MRGs Lungs: CTA throughout; No wheezing, rales, or rhonchi Abdomen: Soft and non-tender Extremities: LEs discolored; No LE edema Dialysis Access: L AVG (+) B/T   Filed Weights   02/17/23 1504 02/19/23 0037 02/19/23 0425  Weight: 86.2 kg 81.9 kg 81.7 kg    Intake/Output Summary (Last 24 hours) at 02/20/2023 1054 Last data filed at 02/19/2023 1800 Gross per 24 hour  Intake 520 ml  Output 0 ml  Net 520 ml    Additional Objective Labs: Basic Metabolic Panel: Recent Labs  Lab 02/17/23 1253 02/18/23 0534 02/19/23 0651  NA 130* 132* 134*  K 4.4 5.1 4.8  CL 86* 89* 91*  CO2 25 25 27   GLUCOSE 112* 153* 131*  BUN 52* 63* 36*  CREATININE 6.80* 7.78* 5.82*  CALCIUM 8.8* 8.8* 9.2  PHOS  --  7.7* 4.9*   Liver Function Tests: Recent Labs  Lab 02/18/23 0534 02/19/23 0651  ALBUMIN 2.3* 2.5*   No results for input(s): "LIPASE", "AMYLASE" in the last 168 hours. CBC: Recent Labs  Lab 02/17/23 1253 02/18/23 0534 02/19/23 0651 02/20/23 0632  WBC 15.2* 11.6* 11.7* 19.5*  NEUTROABS 13.2*  --  10.0* 17.3*  HGB 13.0 12.7* 13.4 11.7*  HCT 42.4 40.4 43.1 38.9*  MCV 96.4 96.4 93.1 97.5  PLT 279 254 299 303   Blood Culture    Component Value Date/Time   SDES BLOOD RIGHT ARM 02/20/2023  0632   SPECREQUEST  02/20/2023 0632    BOTTLES DRAWN AEROBIC ONLY Blood Culture adequate volume   CULT  02/20/2023 0632    NO GROWTH <12 HOURS Performed at [facility], [address], [place], [place] [address]    REPTSTATUS PENDING 02/20/2023 6045    Cardiac Enzymes: No results for input(s): "CKTOTAL", "CKMB", "CKMBINDEX", "TROPONINI" in the last 168 hours. CBG: Recent Labs  Lab 02/19/23 1212 02/19/23 1646 02/19/23 2125 02/20/23 0145 02/20/23 0828  GLUCAP 114* 167* 188* 197* 288*   Iron Studies: No results for input(s): "IRON", "TIBC", "TRANSFERRIN", "FERRITIN" in the last 72 hours. Lab Results  Component Value Date   INR 1.5 (H) 12/12/2020   INR 1.4 (H) 12/11/2020   INR 0.98 06/14/2016   Studies/Results: CT CHEST WO CONTRAST  Result Date: 02/20/2023 CLINICAL DATA:  72 year old male with history of hypoxia and pleural effusion. Chest pain. Suspected malignancy. EXAM: CT CHEST WITHOUT CONTRAST TECHNIQUE: Multidetector CT imaging of the chest was performed following the standard protocol without IV contrast. RADIATION DOSE REDUCTION: This exam was performed according to the departmental dose-optimization program which includes automated exposure control, adjustment of the mA and/or kV according to patient size and/or use of iterative reconstruction technique.  COMPARISON:  Chest CT 03/26/2012. FINDINGS: Cardiovascular: Heart size is enlarged. There is no significant pericardial fluid, thickening or pericardial calcification. There is aortic atherosclerosis, as well as atherosclerosis of the great vessels of the mediastinum and the coronary arteries, including calcified atherosclerotic plaque in the left main, left anterior descending, left circumflex and right coronary arteries. Status post median sternotomy for CABG including LIMA to the LAD. Severely calcified aortic valve. Mediastinum/Nodes: No pathologically enlarged mediastinal or hilar lymph nodes. Please note that accurate  exclusion of hilar adenopathy is limited on noncontrast CT scans. Esophagus is unremarkable in appearance. No axillary lymphadenopathy. Lungs/Pleura: Small right pleural effusion with what appears to be extensive pleural thickening (poorly evaluated on today's noncontrast CT examination), but similar to the prior study from 03/26/2022. Areas of architectural distortion in the right lung, most evident in the posterior aspect of the right lower lobe, likely to reflect areas of rounded atelectasis. Linear scarring in the left lower lobe. Left lung is otherwise clear. No left pleural effusion. No definite suspicious appearing pulmonary nodules or masses are otherwise noted. Upper Abdomen: Aortic atherosclerosis. Numerous bilateral renal lesions ranging from low to intermediate to high attenuation, most concerning of which is a partially imaged exophytic 1.4 cm high attenuation lesion in the posteromedial aspect of the interpolar region of the left kidney (axial image 188 of series 3), increased in size compared to prior examinations. Extensive atherosclerosis. Musculoskeletal: Median sternotomy wires. There are no aggressive appearing lytic or blastic lesions noted in the visualized portions of the skeleton. IMPRESSION: 1. Chronic right pleural effusion with probable rounded atelectasis in the right lower lobe, similar to the prior examination. 2. Numerous renal lesions bilaterally, indeterminate on today's study. Further evaluation with nonemergent abdominal MRI with and without IV gadolinium is recommended in the near future to provide definitive characterization and exclude neoplasm. 3. Aortic atherosclerosis, in addition to left main and three-vessel coronary artery disease. Status post median sternotomy for CABG including LIMA to the LAD. 4. There are severe calcifications of the aortic valve. Echocardiographic correlation for evaluation of potential valvular dysfunction may be warranted if clinically indicated.  Aortic Atherosclerosis (ICD10-I70.0). Electronically Signed   By: Trudie Reed M.D.   On: 02/20/2023 09:51   DG CHEST PORT 1 VIEW  Result Date: 02/20/2023 CLINICAL DATA:  72 year old male with history of shortness of breath. EXAM: PORTABLE CHEST 1 VIEW COMPARISON:  Chest x-ray 05/18/2022. FINDINGS: Elevation of the right hemidiaphragm redemonstrated. Architectural distortion and irregular opacities in the right mid to lower lung, which may reflect areas of atelectasis and/or scarring, although underlying airspace consolidation is not excluded. Small right pleural effusion. Left lung is clear. No left pleural effusion. No pneumothorax. No evidence of pulmonary edema. Heart size is mildly enlarged. Upper mediastinal contours are distorted by patient's rotation to the right. Atherosclerotic calcifications are noted in the thoracic aorta. Status post median sternotomy for CABG. IMPRESSION: 1. Elevation of the right hemidiaphragm with probable atelectasis and/or scarring in the right lung base and small right pleural effusion. Underlying airspace disease in this region is not excluded, but not favored. 2. Aortic atherosclerosis. 3. Mild cardiomegaly. Electronically Signed   By: Trudie Reed M.D.   On: 02/20/2023 05:11    Medications:  lactated ringers     piperacillin-tazobactam (ZOSYN)  IV     vancomycin      amitriptyline  25 mg Oral QHS   aspirin EC  325 mg Oral Once per day on Sunday Tuesday Thursday Saturday   atorvastatin  80 mg Oral q1800   brimonidine  1 drop Both Eyes q AM   Chlorhexidine Gluconate Cloth  6 each Topical Q0600   heparin  5,000 Units Subcutaneous Q8H   influenza vaccine adjuvanted  0.5 mL Intramuscular Tomorrow-1000   insulin aspart  0-9 Units Subcutaneous TID WC   latanoprost  2 drop Both Eyes BID   loperamide  4 mg Oral Once   metoprolol tartrate  25 mg Oral BID   sevelamer carbonate  800 mg Oral TID WC    Dialysis Orders: [facility]) -  MWF Time: 4h 15 min Qb: 500, Qd: 500 Bath: 2k/2Ca+ EDW: 82kg Heparin 2000u at start  Assessment/Plan: 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 On ABXs; plan for angiogram on 9/10 per [facility]; Ortho consulted for possible TMA Hypotension - Informed of hypotensive episode overnight. LR and 1x Midodrine dose were given. Hydralazine/Minoxidil were stopped. He remains on Metoprolol BID. Bps improved and patient feels better. May need to stop the Metoprolol and start Midodrine on HD days if hypotension doesn't improve. Will see how he does on HD tomorrow. Possible PNA - Per recent CT, blood cultures drawn today, on ABXs, managed per primary ESRD: On HD MWF. Next HD 9/9. See #2. CT chest showed numerous renal lesions BL. Per hospitalist, plan to schedule outpatient urology follow-up at discharge for further evaluation. HTN/Vol - Bps stable here currently. Per OP HD Nurse, he has a history of hypotension when nearing EDW. See #2. Metabolic bone disease: Phos was 7.7 at admit so binders started. Phos now at goal. Ca is ok. Anemia of CKD: Hgb at goal. Fe/ESA is not indicated at this time. DM2: Continue to exercise optimal glycemic control, avoiding sweetened beverages and complex carbohydrates. Managed by hospital service.  History CAD s/p CABG - on ASA/statin, troponins and BNP are trending up here, ECHO ordered and awaiting results, managed by primary  Salome Holmes, NP [facility] 02/20/2023,10:54 AM  LOS: 3 days

## 2023-02-21 ENCOUNTER — Inpatient Hospital Stay (HOSPITAL_COMMUNITY): Payer: Medicare Other

## 2023-02-21 DIAGNOSIS — R079 Chest pain, unspecified: Secondary | ICD-10-CM

## 2023-02-21 DIAGNOSIS — Z951 Presence of aortocoronary bypass graft: Secondary | ICD-10-CM | POA: Diagnosis not present

## 2023-02-21 DIAGNOSIS — I96 Gangrene, not elsewhere classified: Secondary | ICD-10-CM | POA: Diagnosis not present

## 2023-02-21 DIAGNOSIS — Z01810 Encounter for preprocedural cardiovascular examination: Secondary | ICD-10-CM | POA: Diagnosis not present

## 2023-02-21 DIAGNOSIS — R7989 Other specified abnormal findings of blood chemistry: Secondary | ICD-10-CM | POA: Diagnosis not present

## 2023-02-21 LAB — RENAL FUNCTION PANEL
Albumin: 2.3 g/dL — ABNORMAL LOW (ref 3.5–5.0)
Anion gap: 20 — ABNORMAL HIGH (ref 5–15)
BUN: 75 mg/dL — ABNORMAL HIGH (ref 8–23)
CO2: 21 mmol/L — ABNORMAL LOW (ref 22–32)
Calcium: 9.4 mg/dL (ref 8.9–10.3)
Chloride: 86 mmol/L — ABNORMAL LOW (ref 98–111)
Creatinine, Ser: 9.34 mg/dL — ABNORMAL HIGH (ref 0.61–1.24)
GFR, Estimated: 6 mL/min — ABNORMAL LOW (ref >60)
Glucose, Bld: 215 mg/dL — ABNORMAL HIGH (ref 70–99)
Phosphorus: 8.5 mg/dL — ABNORMAL HIGH (ref 2.5–4.6)
Potassium: 5.9 mmol/L — ABNORMAL HIGH (ref 3.5–5.1)
Sodium: 127 mmol/L — ABNORMAL LOW (ref 135–145)

## 2023-02-21 LAB — CBC WITH DIFFERENTIAL/PLATELET
Abs Immature Granulocytes: 0.13 10*3/uL — ABNORMAL HIGH (ref 0.00–0.07)
Basophils Absolute: 0 10*3/uL (ref 0.0–0.1)
Basophils Relative: 0 %
Eosinophils Absolute: 0 10*3/uL (ref 0.0–0.5)
Eosinophils Relative: 0 %
HCT: 36.4 % — ABNORMAL LOW (ref 39.0–52.0)
Hemoglobin: 11.7 g/dL — ABNORMAL LOW (ref 13.0–17.0)
Immature Granulocytes: 1 %
Lymphocytes Relative: 3 %
Lymphs Abs: 0.5 10*3/uL — ABNORMAL LOW (ref 0.7–4.0)
MCH: 29.9 pg (ref 26.0–34.0)
MCHC: 32.1 g/dL (ref 30.0–36.0)
MCV: 93.1 fL (ref 80.0–100.0)
Monocytes Absolute: 1.1 10*3/uL — ABNORMAL HIGH (ref 0.1–1.0)
Monocytes Relative: 6 %
Neutro Abs: 16.2 10*3/uL — ABNORMAL HIGH (ref 1.7–7.7)
Neutrophils Relative %: 90 %
Platelets: 288 10*3/uL (ref 150–400)
RBC: 3.91 MIL/uL — ABNORMAL LOW (ref 4.22–5.81)
RDW: 17.2 % — ABNORMAL HIGH (ref 11.5–15.5)
WBC: 18 10*3/uL — ABNORMAL HIGH (ref 4.0–10.5)
nRBC: 0 % (ref 0.0–0.2)

## 2023-02-21 LAB — PROCALCITONIN: Procalcitonin: 5.18 ng/mL

## 2023-02-21 LAB — GLUCOSE, CAPILLARY
Glucose-Capillary: 217 mg/dL — ABNORMAL HIGH (ref 70–99)
Glucose-Capillary: 223 mg/dL — ABNORMAL HIGH (ref 70–99)
Glucose-Capillary: 140 mg/dL — ABNORMAL HIGH (ref 70–99)

## 2023-02-21 LAB — ECHOCARDIOGRAM COMPLETE
AR max vel: 0.96 cm2
AV Area VTI: 1.27 cm2
AV Area mean vel: 1.02 cm2
AV Mean grad: 20.4 mmHg
AV Peak grad: 36.8 mmHg
Ao pk vel: 3.03 m/s
Area-P 1/2: 4.06 cm2
Height: 77 in
MV M vel: 0.92 m/s
MV Peak grad: 3.4 mmHg
S' Lateral: 3.2 cm
Weight: 2881.85 [oz_av]

## 2023-02-21 LAB — C-REACTIVE PROTEIN: CRP: 28.7 mg/dL — ABNORMAL HIGH (ref <1.0)

## 2023-02-21 LAB — AMMONIA: Ammonia: 24 umol/L (ref 9–35)

## 2023-02-21 LAB — MAGNESIUM: Magnesium: 2.4 mg/dL (ref 1.7–2.4)

## 2023-02-21 LAB — BRAIN NATRIURETIC PEPTIDE: B Natriuretic Peptide: 3350.2 pg/mL — ABNORMAL HIGH (ref 0.0–100.0)

## 2023-02-21 MED ORDER — LIDOCAINE-PRILOCAINE 2.5-2.5 % EX CREA: 1.0000 | TOPICAL_CREAM | CUTANEOUS | Status: DC | PRN

## 2023-02-21 MED ORDER — HYDROMORPHONE HCL 1 MG/ML IJ SOLN
0.5000 mg | Freq: Once | INTRAMUSCULAR | Status: AC
  Administered 2023-02-21: 0.5 mg via INTRAVENOUS
  Filled 2023-02-21: qty 0.5

## 2023-02-21 MED ORDER — ALTEPLASE 2 MG IJ SOLR: 2.0000 mg | Freq: Once | INTRAMUSCULAR | Status: DC | PRN

## 2023-02-21 MED ORDER — VANCOMYCIN HCL IN DEXTROSE 1-5 GM/200ML-% IV SOLN
1000.0000 mg | INTRAVENOUS | Status: DC
  Administered 2023-02-23: 1000 mg via INTRAVENOUS
  Filled 2023-02-21 (×2): qty 200

## 2023-02-21 MED ORDER — VANCOMYCIN HCL 1500 MG/300ML IV SOLN
1500.0000 mg | Freq: Once | INTRAVENOUS | Status: AC
  Administered 2023-02-21: 1500 mg via INTRAVENOUS
  Filled 2023-02-21: qty 300

## 2023-02-21 MED ORDER — HEPARIN SODIUM (PORCINE) 1000 UNIT/ML DIALYSIS: 1000.0000 [IU] | INTRAMUSCULAR | Status: DC | PRN

## 2023-02-21 MED ORDER — PENTAFLUOROPROP-TETRAFLUOROETH EX AERO: 1.0000 | INHALATION_SPRAY | CUTANEOUS | Status: DC | PRN

## 2023-02-21 MED ORDER — HYDRALAZINE HCL 20 MG/ML IJ SOLN: 10.0000 mg | Freq: Four times a day (QID) | INTRAMUSCULAR | Status: DC | PRN

## 2023-02-21 MED ORDER — LIDOCAINE HCL (PF) 1 % IJ SOLN: 5.0000 mL | INTRAMUSCULAR | Status: DC | PRN

## 2023-02-21 NOTE — Progress Notes (Signed)
PROGRESS NOTE        PATIENT DETAILS Name: [name] Age: 72 y.o. Sex: male Date of Birth: [birth year] Admit Date: 02/17/2023 Admitting Physician Catarina Hartshorn, MD PCP:[facility]  Brief Summary: Patient is a 72 y.o.  male with history of ESRD on HD MWF, DM-2, HTN, CAD s/p CABG-who presented [facility] ED with left diabetic foot with gangrenous appearing toes-patient was transferred to [facility] for Ortho/vascular surgery evaluation.  Significant events: 9/5>> admit to [facility]-transfer to [facility] from [facility]  Significant studies: 9/5>> x-ray left foot: There is soft tissue thickening of third, fourth, fifth toes-possible associated acro-osteolysis.  Significant microbiology data: None  Procedures: ABI -   Right: Resting right ankle-brachial index indicates moderate right lower extremity arterial disease. The right toe-brachial index is absent. Left: Resting left ankle-brachial index indicates severe left lower extremity arterial disease. The left toe-brachial index is absent.  Consults: Vascular surgery Orthopedics Nephrology.   Cardiology  Subjective:   Patient in bed, appears comfortable, denies any headache, no fever, no chest pain or pressure, no shortness of breath , no abdominal pain. No new focal weakness.    Objective: Vitals: Blood pressure (!) 140/60, pulse 86, temperature 98.6 F (37 C), temperature source Oral, resp. rate (!) 24, height 6\' 5"  (1.956 m), weight 81.7 kg, SpO2 92%.   Exam:  Awake Alert, No new F.N deficits, Normal affect [place].AT,PERRAL Supple Neck, No JVD,   Symmetrical Chest wall movement, Good air movement bilaterally, CTAB RRR,No Gallops, Rubs or new Murmurs,  +ve B.Sounds, Abd Soft, No tenderness,   Several necrotic looking toes in the left foot.   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 -POA - Unchanged overnight, Continue Vancomycin/Zosyn, Vascular surgery planning  angiogram 02/22/23, Vascular surgery will subsequently consult orthopedics for possible transmetatarsal amputation  Episode of hypotension night of 02/19/2023, likely due to gastroenteritis causing dehydration in the presence of multiple blood pressure medications including minoxidil.  Blood cultures drawn and are pending, inflammatory markers are stable as before, ental IV fluid given, blood pressure medications adjusted.  He is stable and nontoxic.  Continue to monitor.  Antibiotics have been broadened from vancomycin to vancomycin plus Zosyn on 02/20/2023.  Follow cultures and monitor clinically.  ESRD on HD MWF   Nephrology following, discussed with nephrology on 02/20/2023.  Mildly elevated troponin.  Monitor trend.  No left-sided chest pain, EKG unchanged, on aspirin and statin, low-dose beta-blocker if he can tolerate, has history of CAD in the past, echo ordered and pending.  Will monitor.  Chest pain-free.  Cardiology on board appreciate their input.  History of CAD s/p CABG - kindly see above.  HTN  BP had dropped on multiple medications currently only on beta-blocker and as needed hydralazine.  Blood pressure improving.  HLD  Statin  Nonspecific renal lesions noted on CT scan incidentally.  Outpatient urology follow-up, to be arranged by PCP postdischarge.    DM-2 (A1c 8.4 on 9/5) - Started SSI-follow CBG trend-and start long-acting insulin if needed, DM education  CBG (last 3)  Recent Labs    02/20/23 2123 02/20/23 2204 02/21/23 0814  GLUCAP 217* 226* 217*   Lab Results  Component Value Date   HGBA1C 8.4 (H) 02/17/2023     Code status:   Code Status: Full Code   DVT Prophylaxis: heparin injection 5,000 Units  Start: 02/17/23 2200   Family Communication: None at bedside  Disposition Plan: Status is: Inpatient Remains inpatient appropriate because: Severity of illness   Planned Discharge Destination:Home health   Diet: Diet Order             Diet NPO time  specified Except for: Ice Chips, Sips with Meds  Diet effective midnight           Diet Carb Modified Fluid consistency: Thin; Room service appropriate? Yes  Diet effective now                    MEDICATIONS: Scheduled Meds:  amitriptyline  25 mg Oral QHS   aspirin EC  325 mg Oral Once per day on Sunday Tuesday Thursday Saturday   atorvastatin  80 mg Oral q1800   brimonidine  1 drop Both Eyes q AM   Chlorhexidine Gluconate Cloth  6 each Topical Q0600   heparin  5,000 Units Subcutaneous Q8H   influenza vaccine adjuvanted  0.5 mL Intramuscular Tomorrow-1000   insulin aspart  0-9 Units Subcutaneous TID WC   latanoprost  2 drop Both Eyes BID   loperamide  4 mg Oral Once   metoprolol tartrate  25 mg Oral BID   sevelamer carbonate  800 mg Oral TID WC   Continuous Infusions:  lactated ringers     piperacillin-tazobactam (ZOSYN)  IV 2.25 g (02/21/23 0425)   vancomycin     vancomycin     PRN Meds:.acetaminophen **OR** acetaminophen, hydrALAZINE, melatonin, ondansetron **OR** ondansetron (ZOFRAN) IV, prochlorperazine   I have personally reviewed following labs and imaging studies  LABORATORY DATA:  Recent Labs  Lab 02/17/23 1253 02/18/23 0534 02/19/23 0651 02/20/23 0632 02/21/23 0433  WBC 15.2* 11.6* 11.7* 19.5* 18.0*  HGB 13.0 12.7* 13.4 11.7* 11.7*  HCT 42.4 40.4 43.1 38.9* 36.4*  PLT 279 254 299 303 288  MCV 96.4 96.4 93.1 97.5 93.1  MCH 29.5 30.3 28.9 29.3 29.9  MCHC 30.7 31.4 31.1 30.1 32.1  RDW 17.1* 17.0* 17.0* 17.2* 17.2*  LYMPHSABS 0.8  --  0.7 0.7 0.5*  MONOABS 1.0  --  0.8 1.2* 1.1*  EOSABS 0.1  --  0.1 0.0 0.0  BASOSABS 0.0  --  0.0 0.0 0.0    Recent Labs  Lab 02/17/23 1253 02/17/23 2034 02/18/23 0534 02/19/23 0651 02/20/23 0632 02/20/23 0646 02/21/23 0433 02/21/23 0438  NA 130*  --  132* 134*  --   --  127*  --   K 4.4  --  5.1 4.8  --   --  5.9*  --   CL 86*  --  89* 91*  --   --  86*  --   CO2 25  --  25 27  --   --  21*  --   ANIONGAP  19*  --  18* 16*  --   --  20*  --   GLUCOSE 112*  --  153* 131*  --   --  215*  --   BUN 52*  --  63* 36*  --   --  75*  --   CREATININE 6.80*  --  7.78* 5.82*  --   --  9.34*  --   ALBUMIN  --   --  2.3* 2.5*  --   --  2.3*  --   CRP  --  22.7*  --  23.7* 21.5*  --  28.7*  --   PROCALCITON  --   --   --  2.29 2.24  --  5.18  --   LATICACIDVEN  --   --   --   --  5.2*  --   --   --   HGBA1C  --  8.4*  --   --   --   --   --   --   AMMONIA  --   --   --  15 30  --   --  24  BNP  --   --   --  2,618.1*  --  1,153.5* 3,350.2*  --   MG  --   --   --  2.1 2.4  --  2.4  --   CALCIUM 8.8*  --  8.8* 9.2  --   --  9.4  --     Recent Labs  Lab 02/17/23 1253 02/17/23 2034 02/18/23 0534 02/19/23 0651 02/20/23 0632 02/20/23 0646 02/21/23 0433 02/21/23 0438  CRP  --  22.7*  --  23.7* 21.5*  --  28.7*  --   PROCALCITON  --   --   --  2.29 2.24  --  5.18  --   LATICACIDVEN  --   --   --   --  5.2*  --   --   --   HGBA1C  --  8.4*  --   --   --   --   --   --   AMMONIA  --   --   --  15 30  --   --  24  BNP  --   --   --  2,618.1*  --  1,153.5* 3,350.2*  --   MG  --   --   --  2.1 2.4  --  2.4  --   CALCIUM 8.8*  --  8.8* 9.2  --   --  9.4  --    RADIOLOGY STUDIES/RESULTS: [place] EKG SITE RITE  Result Date: 02/20/2023 If Site Rite image not attached, placement could not be confirmed due to current cardiac rhythm.  CT CHEST WO CONTRAST  Result Date: 02/20/2023 CLINICAL DATA:  72 year old male with history of hypoxia and pleural effusion. Chest pain. Suspected malignancy. EXAM: CT CHEST WITHOUT CONTRAST TECHNIQUE: Multidetector CT imaging of the chest was performed following the standard protocol without IV contrast. RADIATION DOSE REDUCTION: This exam was performed according to the departmental dose-optimization program which includes automated exposure control, adjustment of the mA and/or kV according to patient size and/or use of iterative reconstruction technique. COMPARISON:  Chest CT  03/26/2012. FINDINGS: Cardiovascular: Heart size is enlarged. There is no significant pericardial fluid, thickening or pericardial calcification. There is aortic atherosclerosis, as well as atherosclerosis of the great vessels of the mediastinum and the coronary arteries, including calcified atherosclerotic plaque in the left main, left anterior descending, left circumflex and right coronary arteries. Status post median sternotomy for CABG including LIMA to the LAD. Severely calcified aortic valve. Mediastinum/Nodes: No pathologically enlarged mediastinal or hilar lymph nodes. Please note that accurate exclusion of hilar adenopathy is limited on noncontrast CT scans. Esophagus is unremarkable in appearance. No axillary lymphadenopathy. Lungs/Pleura: Small right pleural effusion with what appears to be extensive pleural thickening (poorly evaluated on today's noncontrast CT examination), but similar to the prior study from 03/26/2022. Areas of architectural distortion in the right lung, most evident in the posterior aspect of the right lower lobe, likely to reflect areas of rounded atelectasis. Linear scarring in the left lower lobe. Left lung is otherwise clear.  No left pleural effusion. No definite suspicious appearing pulmonary nodules or masses are otherwise noted. Upper Abdomen: Aortic atherosclerosis. Numerous bilateral renal lesions ranging from low to intermediate to high attenuation, most concerning of which is a partially imaged exophytic 1.4 cm high attenuation lesion in the posteromedial aspect of the interpolar region of the left kidney (axial image 188 of series 3), increased in size compared to prior examinations. Extensive atherosclerosis. Musculoskeletal: Median sternotomy wires. There are no aggressive appearing lytic or blastic lesions noted in the visualized portions of the skeleton. IMPRESSION: 1. Chronic right pleural effusion with probable rounded atelectasis in the right lower lobe, similar to  the prior examination. 2. Numerous renal lesions bilaterally, indeterminate on today's study. Further evaluation with nonemergent abdominal MRI with and without IV gadolinium is recommended in the near future to provide definitive characterization and exclude neoplasm. 3. Aortic atherosclerosis, in addition to left main and three-vessel coronary artery disease. Status post median sternotomy for CABG including LIMA to the LAD. 4. There are severe calcifications of the aortic valve. Echocardiographic correlation for evaluation of potential valvular dysfunction may be warranted if clinically indicated. Aortic Atherosclerosis (ICD10-I70.0). Electronically Signed   By: Trudie Reed M.D.   On: 02/20/2023 09:51   DG CHEST PORT 1 VIEW  Result Date: 02/20/2023 CLINICAL DATA:  72 year old male with history of shortness of breath. EXAM: PORTABLE CHEST 1 VIEW COMPARISON:  Chest x-ray 05/18/2022. FINDINGS: Elevation of the right hemidiaphragm redemonstrated. Architectural distortion and irregular opacities in the right mid to lower lung, which may reflect areas of atelectasis and/or scarring, although underlying airspace consolidation is not excluded. Small right pleural effusion. Left lung is clear. No left pleural effusion. No pneumothorax. No evidence of pulmonary edema. Heart size is mildly enlarged. Upper mediastinal contours are distorted by patient's rotation to the right. Atherosclerotic calcifications are noted in the thoracic aorta. Status post median sternotomy for CABG. IMPRESSION: 1. Elevation of the right hemidiaphragm with probable atelectasis and/or scarring in the right lung base and small right pleural effusion. Underlying airspace disease in this region is not excluded, but not favored. 2. Aortic atherosclerosis. 3. Mild cardiomegaly. Electronically Signed   By: Trudie Reed M.D.   On: 02/20/2023 05:11     LOS: 4 days   Signature  -    Susa Raring M.D on 02/21/2023 at 8:30 AM   -  To page go to  [email or web address]

## 2023-02-21 NOTE — Progress Notes (Signed)
  Echocardiogram 2D Echocardiogram has been performed.  [name] 02/21/2023, 11:03 AM

## 2023-02-21 NOTE — Care Management Important Message (Signed)
Important Message  Patient Details  Name: [name] MRN: [id number] Date of Birth: [birth year]   Medicare Important Message Given:  Yes     Adan Beal Stefan [place] 02/21/2023, 3:06 PM

## 2023-02-21 NOTE — Progress Notes (Signed)
Subjective:  Denies SSCP, palpitations or Dyspnea  Objective:  Vitals:   02/20/23 2158 02/20/23 2330 02/21/23 0335 02/21/23 0428  BP: (!) 123/47 118/75 (!) 139/57 (!) 140/60  Pulse: 80 87 85 86  Resp:  18 (!) 28 (!) 24  Temp:  98.2 F (36.8 C) 98.6 F (37 C) 98.6 F (37 C)  TempSrc:  Axillary Oral Oral  SpO2:  94% 90% 92%  Weight:      Height:        Intake/Output from previous day:  Intake/Output Summary (Last 24 hours) at 02/21/2023 0824 Last data filed at 02/20/2023 1800 Gross per 24 hour  Intake 860 ml  Output --  Net 860 ml    Physical Exam:  Left carotid bruit No murmur Lungs clear  Decreased peripheral pulses with gangrene dry Left 205 toes Abdomen benign  No edema  Lab Results: Basic Metabolic Panel: Recent Labs    02/19/23 0651 02/20/23 0632 02/21/23 0433  NA 134*  --  127*  K 4.8  --  5.9*  CL 91*  --  86*  CO2 27  --  21*  GLUCOSE 131*  --  215*  BUN 36*  --  75*  CREATININE 5.82*  --  9.34*  CALCIUM 9.2  --  9.4  MG 2.1 2.4 2.4  PHOS 4.9*  --  8.5*   Liver Function Tests: Recent Labs    02/19/23 0651 02/21/23 0433  ALBUMIN 2.5* 2.3*   No results for input(s): "LIPASE", "AMYLASE" in the last 72 hours. CBC: Recent Labs    02/20/23 0632 02/21/23 0433  WBC 19.5* 18.0*  NEUTROABS 17.3* 16.2*  HGB 11.7* 11.7*  HCT 38.9* 36.4*  MCV 97.5 93.1  PLT 303 288    Imaging: [place] EKG SITE RITE  Result Date: 02/20/2023 If Site Rite image not attached, placement could not be confirmed due to current cardiac rhythm.  CT CHEST WO CONTRAST  Result Date: 02/20/2023 CLINICAL DATA:  72 year old male with history of hypoxia and pleural effusion. Chest pain. Suspected malignancy. EXAM: CT CHEST WITHOUT CONTRAST TECHNIQUE: Multidetector CT imaging of the chest was performed following the standard protocol without IV contrast. RADIATION DOSE REDUCTION: This exam was performed according to the departmental dose-optimization program which includes  automated exposure control, adjustment of the mA and/or kV according to patient size and/or use of iterative reconstruction technique. COMPARISON:  Chest CT 03/26/2012. FINDINGS: Cardiovascular: Heart size is enlarged. There is no significant pericardial fluid, thickening or pericardial calcification. There is aortic atherosclerosis, as well as atherosclerosis of the great vessels of the mediastinum and the coronary arteries, including calcified atherosclerotic plaque in the left main, left anterior descending, left circumflex and right coronary arteries. Status post median sternotomy for CABG including LIMA to the LAD. Severely calcified aortic valve. Mediastinum/Nodes: No pathologically enlarged mediastinal or hilar lymph nodes. Please note that accurate exclusion of hilar adenopathy is limited on noncontrast CT scans. Esophagus is unremarkable in appearance. No axillary lymphadenopathy. Lungs/Pleura: Small right pleural effusion with what appears to be extensive pleural thickening (poorly evaluated on today's noncontrast CT examination), but similar to the prior study from 03/26/2022. Areas of architectural distortion in the right lung, most evident in the posterior aspect of the right lower lobe, likely to reflect areas of rounded atelectasis. Linear scarring in the left lower lobe. Left lung is otherwise clear. No left pleural effusion. No definite suspicious appearing pulmonary nodules or masses are otherwise noted. Upper Abdomen: Aortic atherosclerosis. Numerous bilateral renal  lesions ranging from low to intermediate to high attenuation, most concerning of which is a partially imaged exophytic 1.4 cm high attenuation lesion in the posteromedial aspect of the interpolar region of the left kidney (axial image 188 of series 3), increased in size compared to prior examinations. Extensive atherosclerosis. Musculoskeletal: Median sternotomy wires. There are no aggressive appearing lytic or blastic lesions noted in  the visualized portions of the skeleton. IMPRESSION: 1. Chronic right pleural effusion with probable rounded atelectasis in the right lower lobe, similar to the prior examination. 2. Numerous renal lesions bilaterally, indeterminate on today's study. Further evaluation with nonemergent abdominal MRI with and without IV gadolinium is recommended in the near future to provide definitive characterization and exclude neoplasm. 3. Aortic atherosclerosis, in addition to left main and three-vessel coronary artery disease. Status post median sternotomy for CABG including LIMA to the LAD. 4. There are severe calcifications of the aortic valve. Echocardiographic correlation for evaluation of potential valvular dysfunction may be warranted if clinically indicated. Aortic Atherosclerosis (ICD10-I70.0). Electronically Signed   By: Trudie Reed M.D.   On: 02/20/2023 09:51   DG CHEST PORT 1 VIEW  Result Date: 02/20/2023 CLINICAL DATA:  72 year old male with history of shortness of breath. EXAM: PORTABLE CHEST 1 VIEW COMPARISON:  Chest x-ray 05/18/2022. FINDINGS: Elevation of the right hemidiaphragm redemonstrated. Architectural distortion and irregular opacities in the right mid to lower lung, which may reflect areas of atelectasis and/or scarring, although underlying airspace consolidation is not excluded. Small right pleural effusion. Left lung is clear. No left pleural effusion. No pneumothorax. No evidence of pulmonary edema. Heart size is mildly enlarged. Upper mediastinal contours are distorted by patient's rotation to the right. Atherosclerotic calcifications are noted in the thoracic aorta. Status post median sternotomy for CABG. IMPRESSION: 1. Elevation of the right hemidiaphragm with probable atelectasis and/or scarring in the right lung base and small right pleural effusion. Underlying airspace disease in this region is not excluded, but not favored. 2. Aortic atherosclerosis. 3. Mild cardiomegaly. Electronically  Signed   By: Trudie Reed M.D.   On: 02/20/2023 05:11    Cardiac Studies:  ECG: SR RBBB chronic no acute changes    Telemetry:  NSR  Echo: 04/08/22 EF 60-65%   Medications:    amitriptyline  25 mg Oral QHS   aspirin EC  325 mg Oral Once per day on Sunday Tuesday Thursday Saturday   atorvastatin  80 mg Oral q1800   brimonidine  1 drop Both Eyes q AM   Chlorhexidine Gluconate Cloth  6 each Topical Q0600   heparin  5,000 Units Subcutaneous Q8H   influenza vaccine adjuvanted  0.5 mL Intramuscular Tomorrow-1000   insulin aspart  0-9 Units Subcutaneous TID WC   latanoprost  2 drop Both Eyes BID   loperamide  4 mg Oral Once   metoprolol tartrate  25 mg Oral BID   sevelamer carbonate  800 mg Oral TID WC      lactated ringers     piperacillin-tazobactam (ZOSYN)  IV 2.25 g (02/21/23 0425)   vancomycin     vancomycin      Assessment/Plan:   Elevated Troponin:  need to repeat initial levels 122->245 then jumped to 1813 no chest pain or acute ECG changes. TTE pending for change in EF or new RWMA;s Continue ASA/statin and Wise heparin. Distant history of CABG 2016 given gangrene of LEls and lack of symptoms would proceed with LE angiogram in am. No benefit to delaying any revascularization or amputation  to avoid sepsis unless TTE shows marked change in EF.   Carotid Bruit:  left needs updated duplex ordered  Gangrene:  Continue Zosyn coverage PAD angiogram tomorrow with Dr Sherral Hammers  HLD:  continue statin   Charlton Haws 02/21/2023, 8:24 AM

## 2023-02-21 NOTE — Progress Notes (Signed)
Pharmacy Antibiotic Note  [name] is a 72 y.o. male admitted on 02/17/2023 with  diabetic foot infection w/ gangrene . Patient is ESRD w/ MWF dialysis schedule. Pharmacy has been consulted for vancomycin dosing.  Patient had last HD session 9/7 and did not receive a dose of vancomycin after this session. He is scheduled for next HD on 9/10. Patient is afebrile, WBC remain elevated at 18.  Plan: Re-load 1500mg  vancomycin after HD today Collect vancomycin level with morning labs 9/10 to verify therapeutic levels Continue vancomycin 1000 mg IV every MWF w/ HD Continue to monitor HD schedule and obtain vancomycin levels as indicated  Monitor cultures and follow clinical course and ability to narrow abx as able   Height: 6\' 5"  (195.6 cm) Weight: 86.7 kg (191 lb 2.2 oz) (bed) IBW/kg (Calculated) : 89.1  Temp (24hrs), Avg:98.3 F (36.8 C), Min:97.9 F (36.6 C), Max:98.6 F (37 C)  Recent Labs  Lab 02/17/23 1253 02/18/23 0534 02/19/23 0651 02/20/23 0632 02/21/23 0433  WBC 15.2* 11.6* 11.7* 19.5* 18.0*  CREATININE 6.80* 7.78* 5.82*  --  9.34*  LATICACIDVEN  --   --   --  5.2*  --     Estimated Creatinine Clearance: 8.9 mL/min (A) (by C-G formula based on SCr of 9.34 mg/dL (H)).    Allergies  Allergen Reactions   Shellfish Allergy Hives    Antimicrobials this admission: CTX 9/5 >> 9/8 Doxycycline 9/8 x1 Zosyn 9/8 >> Vanco 9/5; 9/9 >>  Microbiology results: 9/8 Bcx: ngtd  Thank you for allowing pharmacy to be a part of this patient's care.  Rennis Petty, PharmD 02/21/2023 2:43 PM

## 2023-02-21 NOTE — Progress Notes (Signed)
Pt receives out-pt HD at [facility] on MWF. Pt arrives around 7:20 am for 7:50 am chair time. Will assist as needed.   Olivia Canter Renal Navigator [phone]

## 2023-02-21 NOTE — TOC Initial Note (Signed)
Transition of Care [facility]) - Initial/Assessment Note    Patient Details  Name: [name] MRN: [id number] Date of Birth: [birth year]  [facility] CM/SW Contact:    Mearl Latin, LCSW Phone Number: 02/21/2023, 9:02 AM  Clinical Narrative:                 Patient admitted from home and receives outpatient dialysis. CSW following for SNF workup once medical workup is completed.  Expected Discharge Plan: Skilled Nursing Facility Barriers to Discharge: Continued Medical Work up, English as a second language teacher, SNF Pending bed offer   Patient Goals and CMS Choice            Expected Discharge Plan and Services In-house Referral: Clinical Social Work     Living arrangements for the past 2 months: Single Family Home                                      Prior Living Arrangements/Services Living arrangements for the past 2 months: Single Family Home   Patient language and need for interpreter reviewed:: Yes        Need for Family Participation in Patient Care: Yes (Comment) Care giver support system in place?: Yes (comment)   Criminal Activity/Legal Involvement Pertinent to Current Situation/Hospitalization: No - Comment as needed  Activities of Daily Living Home Assistive Devices/Equipment: Cane (specify quad or straight), Walker (specify type), Shower chair with back, Blood pressure cuff, CBG Meter, Dentures (specify type), Eyeglasses, Wheelchair ADL Screening (condition at time of admission) Patient's cognitive ability adequate to safely complete daily activities?: Yes Is the patient deaf or have difficulty hearing?: No Does the patient have difficulty seeing, even when wearing glasses/contacts?: Yes (blurry) Does the patient have difficulty concentrating, remembering, or making decisions?: No Patient able to express need for assistance with ADLs?: Yes Does the patient have difficulty dressing or bathing?: No Independently performs ADLs?: Yes (appropriate  for developmental age) Does the patient have difficulty walking or climbing stairs?: No Weakness of Legs: None Weakness of Arms/Hands: None  Permission Sought/Granted                  Emotional Assessment Appearance:: Appears stated age     Orientation: : Oriented to Self, Oriented to Place, Oriented to  Time Alcohol / Substance Use: Not Applicable Psych Involvement: No (comment)  Admission diagnosis:  Gangrene of left foot (HCC) [I96] Patient Active Problem List   Diagnosis Date Noted   Elevated troponin 02/20/2023   Gangrene of left foot (HCC) 02/17/2023   Diabetic foot infection (HCC) 02/17/2023   Pulmonary hypertension (HCC) 04/22/2022   Pleural effusion on right 04/21/2022   Stage 2 hypertension 02/01/2021   Liver lesions (Infection Vs Metastasis) 12/10/2020   DM (diabetes mellitus) (HCC) 12/10/2020   Hypo-osmolality and hyponatremia 11/28/2020   Unsteady gait    Ataxia 11/16/2020   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   NPDR (nonproliferative diabetic retinopathy) (HCC) 11/21/2016   Pseudophakia of both eyes 11/21/2016   History of adenomatous polyp of colon 09/14/2016   Coronary artery disease involving native coronary artery of native heart without angina pectoris 12/05/2015   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/2015   Secondary hypertension, unspecified    Acute diastolic heart failure (HCC)    Volume overload 03/31/2015   NSTEMI (non-ST elevated myocardial infarction) (HCC) 03/31/2015   Hypoxia 03/21/2015   Fluid overload, unspecified 08/21/2014   End stage renal disease (HCC) 03/07/2014  Encounter for screening for respiratory tuberculosis 02/08/2014   Shortness of breath 02/05/2014   ESRD (end stage renal disease) (HCC) 02/05/2014   Aftercare including intermittent dialysis (HCC) 02/05/2014   Coagulation defect, unspecified (HCC) 02/05/2014   Disorder of phosphorus metabolism, unspecified 02/05/2014   Hypoglycemia, unspecified 02/05/2014   Iron deficiency anemia,  unspecified 02/05/2014   Moderate protein-calorie malnutrition (HCC) 02/05/2014   Other disorders of plasma-protein metabolism, not elsewhere classified 02/05/2014   Pain, unspecified 02/05/2014   Pruritus, unspecified 02/05/2014   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   Thrombocytopenia, unspecified (HCC) 02/05/2014   Chronic kidney disease 01/29/2014   Acute on chronic renal failure (HCC) 01/14/2014   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   Hyponatremia 01/14/2014   Hyperkalemia 01/14/2014   Type 2 diabetes mellitus without complications (HCC) 09/02/2009   HLD (hyperlipidemia) 09/02/2009   Essential (primary) hypertension 09/02/2009   Coronary atherosclerosis 09/02/2009   PCP:  [facility] Pharmacy:   [facility] [address] [place] - [address] [place] [address] [place] [address] [place] [address] Phone: [phone] Fax: [phone]     Social Determinants of Health (SDOH) Social History: SDOH Screenings   Food Insecurity: No Food Insecurity (02/17/2023)  Housing: Low Risk  (02/17/2023)  Transportation Needs: No Transportation Needs (02/17/2023)  Utilities: Not At Risk (02/17/2023)  Tobacco Use: Medium Risk (02/17/2023)   SDOH Interventions:     Readmission Risk Interventions     No data to display

## 2023-02-21 NOTE — Progress Notes (Signed)
Received patient in bed to unit.  Alert and oriented.  Informed consent signed and in chart.   TX duration:3.5  Patient tolerated well.  Transported back to the room  Alert, without acute distress.  Hand-off given to patient's nurse.   Access used: left AVG Access issues: none  Total UF removed: 2L Medication(s) given: vancomycin   02/21/23 1658  Vitals  Temp 98 F (36.7 C)  Temp Source Oral  BP 96/78  MAP (mmHg) 83  BP Location Right Wrist  BP Method Automatic  Patient Position (if appropriate) Lying  Pulse Rate Source Monitor  ECG Heart Rate 90  Resp (!) 21  Oxygen Therapy  SpO2 99 %  O2 Device Room Air  During Treatment Monitoring  HD Safety Checks Performed Yes  Intra-Hemodialysis Comments Tx completed;Tolerated well  Dialysis Fluid Bolus Normal Saline  Bolus Amount (mL) 300 mL      [facility]

## 2023-02-21 NOTE — Progress Notes (Signed)
  Progress Note    02/21/2023 8:13 AM * No surgery date entered *  Subjective:  no complaints    Vitals:   02/21/23 0335 02/21/23 0428  BP: (!) 139/57 (!) 140/60  Pulse: 85 86  Resp: (!) 28 (!) 24  Temp: 98.6 F (37 C) 98.6 F (37 C)  SpO2: 90% 92%    Physical Exam: General:  resting comfortably Cardiac:  regular Lungs:  nonlabored Extremities:  left 2nd-5th toes dry gangrene  CBC    Component Value Date/Time   WBC 18.0 (H) 02/21/2023 0433   RBC 3.91 (L) 02/21/2023 0433   HGB 11.7 (L) 02/21/2023 0433   HCT 36.4 (L) 02/21/2023 0433   PLT 288 02/21/2023 0433   MCV 93.1 02/21/2023 0433   MCH 29.9 02/21/2023 0433   MCHC 32.1 02/21/2023 0433   RDW 17.2 (H) 02/21/2023 0433   LYMPHSABS 0.5 (L) 02/21/2023 0433   MONOABS 1.1 (H) 02/21/2023 0433   EOSABS 0.0 02/21/2023 0433   BASOSABS 0.0 02/21/2023 0433    BMET    Component Value Date/Time   NA 127 (L) 02/21/2023 0433   K 5.9 (H) 02/21/2023 0433   CL 86 (L) 02/21/2023 0433   CO2 21 (L) 02/21/2023 0433   GLUCOSE 215 (H) 02/21/2023 0433   BUN 75 (H) 02/21/2023 0433   CREATININE 9.34 (H) 02/21/2023 0433   CALCIUM 9.4 02/21/2023 0433   CALCIUM 8.3 (L) 01/14/2014 1708   GFRNONAA 6 (L) 02/21/2023 0433   GFRAA 10 (L) 07/29/2018 0024    INR    Component Value Date/Time   INR 1.5 (H) 12/12/2020 0805     Intake/Output Summary (Last 24 hours) at 02/21/2023 0813 Last data filed at 02/20/2023 1800 Gross per 24 hour  Intake 860 ml  Output --  Net 860 ml      Assessment/Plan:  72 y.o. male with left 2nd-5th toes gangrene   -Gangrenous changes to left 2nd-5th toes is unchanged. Toes are dry -Plans for LLE angio tomorrow in the cath lab. The patient is agreeable -Future amputation with ortho pending revascularization -Will make NPO at midnight and place consent orders -Hold SQ heparin tomorrow    Loel Dubonnet, PA-C [facility] [phone] 02/21/2023 8:13 AM

## 2023-02-21 NOTE — Progress Notes (Signed)
Mobility Specialist Progress Note:   02/21/23 1150  Mobility  Activity Ambulated with assistance in hallway  Level of Assistance Contact guard assist, steadying assist  Assistive Device Front wheel walker  Distance Ambulated (ft) 200 ft  Activity Response Tolerated well  Mobility Referral Yes  $Mobility charge 1 Mobility  Mobility Specialist Start Time (ACUTE ONLY) 1135  Mobility Specialist Stop Time (ACUTE ONLY) 1150  Mobility Specialist Time Calculation (min) (ACUTE ONLY) 15 min   Pt agreeable to mobility session. Required only minG assist throughout ambulation. No c/o throughout, pt left sitting EOB. Encouraged frequent OOB mobility.   Addison Lank Mobility Specialist Please contact via SecureChat or  Rehab office at [phone]

## 2023-02-21 NOTE — Progress Notes (Signed)
[facility] Progress Note   Subjective:   Pt reports his legs are burning today, but no other concerns. Denies SOB, CP, dizziness, abdominal pain, N/V/D. Due for HD today. Noted K+ 5.9.   Objective Vitals:   02/20/23 2330 02/21/23 0335 02/21/23 0428 02/21/23 0816  BP: 118/75 (!) 139/57 (!) 140/60   Pulse: 87 85 86   Resp: 18 (!) 28 (!) 24   Temp: 98.2 F (36.8 C) 98.6 F (37 C) 98.6 F (37 C)   TempSrc:  Oral Oral Oral  SpO2: 94% 90% 92%   Weight:      Height:       Physical Exam General: Alert male in NAD Heart: RRR, no murmurs, rubs or gallops Lungs: CTA bilaterally, respirations unlabored on RA Abdomen: Soft, non-distended, +BS Extremities: No edema b/l lower extremities, dusky discoloration Dialysis Access:  LUE AVG +t/b  Additional Objective Labs: Basic Metabolic Panel: Recent Labs  Lab 02/18/23 0534 02/19/23 0651 02/21/23 0433  NA 132* 134* 127*  K 5.1 4.8 5.9*  CL 89* 91* 86*  CO2 25 27 21*  GLUCOSE 153* 131* 215*  BUN 63* 36* 75*  CREATININE 7.78* 5.82* 9.34*  CALCIUM 8.8* 9.2 9.4  PHOS 7.7* 4.9* 8.5*   Liver Function Tests: Recent Labs  Lab 02/18/23 0534 02/19/23 0651 02/21/23 0433  ALBUMIN 2.3* 2.5* 2.3*   No results for input(s): "LIPASE", "AMYLASE" in the last 168 hours. CBC: Recent Labs  Lab 02/17/23 1253 02/18/23 0534 02/19/23 0651 02/20/23 0632 02/21/23 0433  WBC 15.2* 11.6* 11.7* 19.5* 18.0*  NEUTROABS 13.2*  --  10.0* 17.3* 16.2*  HGB 13.0 12.7* 13.4 11.7* 11.7*  HCT 42.4 40.4 43.1 38.9* 36.4*  MCV 96.4 96.4 93.1 97.5 93.1  PLT 279 254 299 303 288   Blood Culture    Component Value Date/Time   SDES BLOOD RIGHT HAND 02/20/2023 0746   SPECREQUEST  02/20/2023 0746    BOTTLES DRAWN AEROBIC ONLY Blood Culture adequate volume   CULT  02/20/2023 0746    NO GROWTH 1 DAY Performed at [facility], [address] [place], [place] [address]    REPTSTATUS PENDING 02/20/2023 [phone]    Cardiac Enzymes: No  results for input(s): "CKTOTAL", "CKMB", "CKMBINDEX", "TROPONINI" in the last 168 hours. CBG: Recent Labs  Lab 02/20/23 1200 02/20/23 1703 02/20/23 2123 02/20/23 2204 02/21/23 0814  GLUCAP 346* 186* 217* 226* 217*   Iron Studies: No results for input(s): "IRON", "TIBC", "TRANSFERRIN", "FERRITIN" in the last 72 hours. @lablastinr3 @ Studies/Results: [place] EKG SITE RITE  Result Date: 02/20/2023 If Site Rite image not attached, placement could not be confirmed due to current cardiac rhythm.  CT CHEST WO CONTRAST  Result Date: 02/20/2023 CLINICAL DATA:  72 year old male with history of hypoxia and pleural effusion. Chest pain. Suspected malignancy. EXAM: CT CHEST WITHOUT CONTRAST TECHNIQUE: Multidetector CT imaging of the chest was performed following the standard protocol without IV contrast. RADIATION DOSE REDUCTION: This exam was performed according to the departmental dose-optimization program which includes automated exposure control, adjustment of the mA and/or kV according to patient size and/or use of iterative reconstruction technique. COMPARISON:  Chest CT 03/26/2012. FINDINGS: Cardiovascular: Heart size is enlarged. There is no significant pericardial fluid, thickening or pericardial calcification. There is aortic atherosclerosis, as well as atherosclerosis of the great vessels of the mediastinum and the coronary arteries, including calcified atherosclerotic plaque in the left main, left anterior descending, left circumflex and right coronary arteries. Status post median sternotomy for CABG  including LIMA to the LAD. Severely calcified aortic valve. Mediastinum/Nodes: No pathologically enlarged mediastinal or hilar lymph nodes. Please note that accurate exclusion of hilar adenopathy is limited on noncontrast CT scans. Esophagus is unremarkable in appearance. No axillary lymphadenopathy. Lungs/Pleura: Small right pleural effusion with what appears to be extensive pleural thickening (poorly  evaluated on today's noncontrast CT examination), but similar to the prior study from 03/26/2022. Areas of architectural distortion in the right lung, most evident in the posterior aspect of the right lower lobe, likely to reflect areas of rounded atelectasis. Linear scarring in the left lower lobe. Left lung is otherwise clear. No left pleural effusion. No definite suspicious appearing pulmonary nodules or masses are otherwise noted. Upper Abdomen: Aortic atherosclerosis. Numerous bilateral renal lesions ranging from low to intermediate to high attenuation, most concerning of which is a partially imaged exophytic 1.4 cm high attenuation lesion in the posteromedial aspect of the interpolar region of the left kidney (axial image 188 of series 3), increased in size compared to prior examinations. Extensive atherosclerosis. Musculoskeletal: Median sternotomy wires. There are no aggressive appearing lytic or blastic lesions noted in the visualized portions of the skeleton. IMPRESSION: 1. Chronic right pleural effusion with probable rounded atelectasis in the right lower lobe, similar to the prior examination. 2. Numerous renal lesions bilaterally, indeterminate on today's study. Further evaluation with nonemergent abdominal MRI with and without IV gadolinium is recommended in the near future to provide definitive characterization and exclude neoplasm. 3. Aortic atherosclerosis, in addition to left main and three-vessel coronary artery disease. Status post median sternotomy for CABG including LIMA to the LAD. 4. There are severe calcifications of the aortic valve. Echocardiographic correlation for evaluation of potential valvular dysfunction may be warranted if clinically indicated. Aortic Atherosclerosis (ICD10-I70.0). Electronically Signed   By: Trudie Reed M.D.   On: 02/20/2023 09:51   DG CHEST PORT 1 VIEW  Result Date: 02/20/2023 CLINICAL DATA:  72 year old male with history of shortness of breath. EXAM:  PORTABLE CHEST 1 VIEW COMPARISON:  Chest x-ray 05/18/2022. FINDINGS: Elevation of the right hemidiaphragm redemonstrated. Architectural distortion and irregular opacities in the right mid to lower lung, which may reflect areas of atelectasis and/or scarring, although underlying airspace consolidation is not excluded. Small right pleural effusion. Left lung is clear. No left pleural effusion. No pneumothorax. No evidence of pulmonary edema. Heart size is mildly enlarged. Upper mediastinal contours are distorted by patient's rotation to the right. Atherosclerotic calcifications are noted in the thoracic aorta. Status post median sternotomy for CABG. IMPRESSION: 1. Elevation of the right hemidiaphragm with probable atelectasis and/or scarring in the right lung base and small right pleural effusion. Underlying airspace disease in this region is not excluded, but not favored. 2. Aortic atherosclerosis. 3. Mild cardiomegaly. Electronically Signed   By: Trudie Reed M.D.   On: 02/20/2023 05:11   Medications:  lactated ringers     piperacillin-tazobactam (ZOSYN)  IV 2.25 g (02/21/23 0425)   vancomycin     vancomycin      amitriptyline  25 mg Oral QHS   aspirin EC  325 mg Oral Once per day on Sunday Tuesday Thursday Saturday   atorvastatin  80 mg Oral q1800   brimonidine  1 drop Both Eyes q AM   Chlorhexidine Gluconate Cloth  6 each Topical Q0600   heparin  5,000 Units Subcutaneous Q8H   influenza vaccine adjuvanted  0.5 mL Intramuscular Tomorrow-1000   insulin aspart  0-9 Units Subcutaneous TID WC   latanoprost  2 drop Both Eyes BID   loperamide  4 mg Oral Once   metoprolol tartrate  25 mg Oral BID   sevelamer carbonate  800 mg Oral TID WC    Outpatient Dialysis Orders: [facility]) - MWF Time: 4h 15 min Qb: 500, Qd: 500 Bath: 2k/2Ca+ EDW: 82kg Heparin 2000u at start  Assessment/Plan: 1.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 On ABXs; plan for angiogram on 9/10 per [facility]; Ortho consulted for possible TMA 2.Hypotension - Informed of hypotensive episode overnight Saturday. LR and 1x Midodrine dose were given. Hydralazine/Minoxidil were stopped. He remains on Metoprolol BID. Bps improved and patient feels better.  3. Possible PNA - Per recent CT, blood cultures drawn today, on ABXs, managed per primary 4. ESRD: On HD MWF. Due for HD today. CT chest showed numerous renal lesions BL. Per hospitalist, plan to schedule outpatient urology follow-up at discharge for further evaluation. 5. Hypokalemia: Will prioritize patient for HD next shift 6. HTN/Vol - Bps stable here currently. Per OP HD Nurse, he has a history of hypotension when nearing EDW. See #2. 7. Metabolic bone disease: Phos was 7.7 at admit so binders started. Phos 8.5, CTM for now. Calcium slightly elevated, not on VDRA 8. Anemia of CKD: Hgb at goal. Fe/ESA is not indicated at this time. 9. DM2: Managed by hospital service.  10. History CAD s/p CABG - on ASA/statin, troponins and BNP are trending up here, ECHO ordered and awaiting results, managed by primary  Rogers Blocker, PA-C 02/21/2023, 10:00 AM  [facility] Pager: [phone]

## 2023-02-21 NOTE — Plan of Care (Signed)
  Problem: Education: Goal: Knowledge of General Education information will improve Description: Including pain rating scale, medication(s)/side effects and non-pharmacologic comfort measures Outcome: Progressing   Problem: Clinical Measurements: Goal: Ability to maintain clinical measurements within normal limits will improve Outcome: Progressing Goal: Will remain free from infection Outcome: Progressing   Problem: Activity: Goal: Risk for activity intolerance will decrease Outcome: Progressing   Problem: Safety: Goal: Ability to remain free from injury will improve Outcome: Progressing   Problem: Skin Integrity: Goal: Risk for impaired skin integrity will decrease Outcome: Progressing   Problem: Skin Integrity: Goal: Risk for impaired skin integrity will decrease Outcome: Progressing

## 2023-02-22 ENCOUNTER — Inpatient Hospital Stay (HOSPITAL_COMMUNITY): Payer: Medicare Other

## 2023-02-22 ENCOUNTER — Encounter (HOSPITAL_COMMUNITY)
Admission: EM | Disposition: A | Discharge status: Skilled Nursing Facility | Payer: Self-pay | Source: Home / Self Care | Attending: Family Medicine

## 2023-02-22 DIAGNOSIS — Z01810 Encounter for preprocedural cardiovascular examination: Secondary | ICD-10-CM

## 2023-02-22 DIAGNOSIS — R7989 Other specified abnormal findings of blood chemistry: Secondary | ICD-10-CM

## 2023-02-22 DIAGNOSIS — Z951 Presence of aortocoronary bypass graft: Secondary | ICD-10-CM | POA: Diagnosis not present

## 2023-02-22 DIAGNOSIS — I70262 Atherosclerosis of native arteries of extremities with gangrene, left leg: Secondary | ICD-10-CM

## 2023-02-22 DIAGNOSIS — R0989 Other specified symptoms and signs involving the circulatory and respiratory systems: Secondary | ICD-10-CM | POA: Diagnosis not present

## 2023-02-22 DIAGNOSIS — I96 Gangrene, not elsewhere classified: Secondary | ICD-10-CM | POA: Diagnosis not present

## 2023-02-22 HISTORY — PX: ABDOMINAL AORTOGRAM W/LOWER EXTREMITY: CATH118223

## 2023-02-22 HISTORY — PX: PERIPHERAL VASCULAR ATHERECTOMY: CATH118256

## 2023-02-22 LAB — AMMONIA: Ammonia: 24 umol/L (ref 9–35)

## 2023-02-22 LAB — VANCOMYCIN, RANDOM: Vancomycin Rm: 31 ug/mL

## 2023-02-22 LAB — GLUCOSE, CAPILLARY
Glucose-Capillary: 161 mg/dL — ABNORMAL HIGH (ref 70–99)
Glucose-Capillary: 190 mg/dL — ABNORMAL HIGH (ref 70–99)
Glucose-Capillary: 250 mg/dL — ABNORMAL HIGH (ref 70–99)

## 2023-02-22 LAB — CBC WITH DIFFERENTIAL/PLATELET
Abs Immature Granulocytes: 0.12 10*3/uL — ABNORMAL HIGH (ref 0.00–0.07)
Basophils Absolute: 0 10*3/uL (ref 0.0–0.1)
Basophils Relative: 0 %
Eosinophils Absolute: 0.1 10*3/uL (ref 0.0–0.5)
Eosinophils Relative: 1 %
HCT: 34.1 % — ABNORMAL LOW (ref 39.0–52.0)
Hemoglobin: 10.7 g/dL — ABNORMAL LOW (ref 13.0–17.0)
Immature Granulocytes: 1 %
Lymphocytes Relative: 5 %
Lymphs Abs: 0.7 10*3/uL (ref 0.7–4.0)
MCH: 28.6 pg (ref 26.0–34.0)
MCHC: 31.4 g/dL (ref 30.0–36.0)
MCV: 91.2 fL (ref 80.0–100.0)
Monocytes Absolute: 0.9 10*3/uL (ref 0.1–1.0)
Monocytes Relative: 6 %
Neutro Abs: 13.3 10*3/uL — ABNORMAL HIGH (ref 1.7–7.7)
Neutrophils Relative %: 87 %
Platelets: 272 10*3/uL (ref 150–400)
RBC: 3.74 MIL/uL — ABNORMAL LOW (ref 4.22–5.81)
RDW: 17.1 % — ABNORMAL HIGH (ref 11.5–15.5)
WBC: 15.2 10*3/uL — ABNORMAL HIGH (ref 4.0–10.5)
nRBC: 0 % (ref 0.0–0.2)

## 2023-02-22 LAB — C-REACTIVE PROTEIN: CRP: 25.7 mg/dL — ABNORMAL HIGH (ref <1.0)

## 2023-02-22 LAB — BASIC METABOLIC PANEL
Anion gap: 15 (ref 5–15)
BUN: 45 mg/dL — ABNORMAL HIGH (ref 8–23)
CO2: 24 mmol/L (ref 22–32)
Calcium: 8.6 mg/dL — ABNORMAL LOW (ref 8.9–10.3)
Chloride: 90 mmol/L — ABNORMAL LOW (ref 98–111)
Creatinine, Ser: 6.72 mg/dL — ABNORMAL HIGH (ref 0.61–1.24)
GFR, Estimated: 8 mL/min — ABNORMAL LOW (ref >60)
Glucose, Bld: 173 mg/dL — ABNORMAL HIGH (ref 70–99)
Potassium: 4.6 mmol/L (ref 3.5–5.1)
Sodium: 129 mmol/L — ABNORMAL LOW (ref 135–145)

## 2023-02-22 LAB — BRAIN NATRIURETIC PEPTIDE: B Natriuretic Peptide: 2554.9 pg/mL — ABNORMAL HIGH (ref 0.0–100.0)

## 2023-02-22 LAB — POCT ACTIVATED CLOTTING TIME
Activated Clotting Time: 257 s
Activated Clotting Time: 281 s
Activated Clotting Time: 220 s

## 2023-02-22 LAB — MAGNESIUM: Magnesium: 2.2 mg/dL (ref 1.7–2.4)

## 2023-02-22 LAB — PROCALCITONIN: Procalcitonin: 5.71 ng/mL

## 2023-02-22 SURGERY — ABDOMINAL AORTOGRAM W/LOWER EXTREMITY
Anesthesia: LOCAL | Laterality: Left

## 2023-02-22 MED ORDER — ROSUVASTATIN CALCIUM 10 MG PO TABS: 10.0000 mg | ORAL_TABLET | Freq: Every day | ORAL | Status: DC

## 2023-02-22 MED ORDER — LIDOCAINE HCL (PF) 1 % IJ SOLN
INTRAMUSCULAR | Status: DC | PRN
  Administered 2023-02-22: 15 mL

## 2023-02-22 MED ORDER — FENTANYL CITRATE (PF) 100 MCG/2ML IJ SOLN
INTRAMUSCULAR | Status: AC
  Filled 2023-02-22: qty 2

## 2023-02-22 MED ORDER — HYDRALAZINE HCL 20 MG/ML IJ SOLN: 5.0000 mg | INTRAMUSCULAR | Status: DC | PRN

## 2023-02-22 MED ORDER — LABETALOL HCL 5 MG/ML IV SOLN: 10.0000 mg | INTRAVENOUS | Status: DC | PRN

## 2023-02-22 MED ORDER — ONDANSETRON HCL 4 MG/2ML IJ SOLN: 4.0000 mg | Freq: Four times a day (QID) | INTRAMUSCULAR | Status: DC | PRN

## 2023-02-22 MED ORDER — HEPARIN SODIUM (PORCINE) 1000 UNIT/ML IJ SOLN
INTRAMUSCULAR | Status: AC
  Filled 2023-02-22: qty 10

## 2023-02-22 MED ORDER — MIDAZOLAM HCL 2 MG/2ML IJ SOLN
INTRAMUSCULAR | Status: AC
  Filled 2023-02-22: qty 2

## 2023-02-22 MED ORDER — CLOPIDOGREL BISULFATE 75 MG PO TABS
75.0000 mg | ORAL_TABLET | Freq: Every day | ORAL | Status: DC
  Administered 2023-02-23 – 2023-02-28 (×6): 75 mg via ORAL
  Filled 2023-02-22 (×6): qty 1

## 2023-02-22 MED ORDER — SODIUM CHLORIDE 0.9 % IV SOLN: 250.0000 mL | INTRAVENOUS | Status: DC | PRN

## 2023-02-22 MED ORDER — OXYCODONE HCL 5 MG PO TABS
5.0000 mg | ORAL_TABLET | ORAL | Status: DC | PRN
  Administered 2023-02-22 – 2023-02-24 (×4): 10 mg via ORAL
  Administered 2023-02-26 – 2023-02-27 (×2): 5 mg via ORAL
  Filled 2023-02-22 (×3): qty 2
  Filled 2023-02-22 (×2): qty 1
  Filled 2023-02-22: qty 2

## 2023-02-22 MED ORDER — ASPIRIN 81 MG PO CHEW
CHEWABLE_TABLET | ORAL | Status: AC
  Filled 2023-02-22: qty 1

## 2023-02-22 MED ORDER — SODIUM CHLORIDE 0.9% FLUSH
3.0000 mL | Freq: Two times a day (BID) | INTRAVENOUS | Status: DC
  Administered 2023-02-22 – 2023-02-23 (×2): 3 mL via INTRAVENOUS

## 2023-02-22 MED ORDER — FENTANYL CITRATE (PF) 100 MCG/2ML IJ SOLN
INTRAMUSCULAR | Status: DC | PRN
  Administered 2023-02-22 (×3): 25 ug via INTRAVENOUS
  Administered 2023-02-22: 50 ug via INTRAVENOUS

## 2023-02-22 MED ORDER — ACETAMINOPHEN 325 MG PO TABS
650.0000 mg | ORAL_TABLET | ORAL | Status: DC | PRN
  Administered 2023-02-22 – 2023-02-23 (×2): 650 mg via ORAL
  Filled 2023-02-22 (×2): qty 2

## 2023-02-22 MED ORDER — CHLORHEXIDINE GLUCONATE CLOTH 2 % EX PADS: 6.0000 | MEDICATED_PAD | Freq: Every day | CUTANEOUS | Status: DC

## 2023-02-22 MED ORDER — IODIXANOL 320 MG/ML IV SOLN
INTRAVENOUS | Status: DC | PRN
  Administered 2023-02-22: 100 mL

## 2023-02-22 MED ORDER — ASPIRIN 81 MG PO TBEC
81.0000 mg | DELAYED_RELEASE_TABLET | Freq: Every day | ORAL | Status: DC
  Administered 2023-02-22 – 2023-02-28 (×7): 81 mg via ORAL
  Filled 2023-02-22 (×6): qty 1

## 2023-02-22 MED ORDER — LIDOCAINE HCL (PF) 1 % IJ SOLN
INTRAMUSCULAR | Status: AC
  Filled 2023-02-22: qty 30

## 2023-02-22 MED ORDER — SODIUM CHLORIDE 0.9% FLUSH: 3.0000 mL | INTRAVENOUS | Status: DC | PRN

## 2023-02-22 MED ORDER — SODIUM CHLORIDE 0.9 % IV SOLN
INTRAVENOUS | Status: AC | PRN
  Administered 2023-02-22: 250 mL via INTRAVENOUS

## 2023-02-22 MED ORDER — HEPARIN SODIUM (PORCINE) 1000 UNIT/ML IJ SOLN
INTRAMUSCULAR | Status: DC | PRN
  Administered 2023-02-22: 2000 [IU] via INTRAVENOUS
  Administered 2023-02-22: 9000 [IU] via INTRAVENOUS

## 2023-02-22 MED ORDER — MORPHINE SULFATE (PF) 2 MG/ML IV SOLN: 2.0000 mg | INTRAVENOUS | Status: DC | PRN

## 2023-02-22 MED ORDER — HEPARIN (PORCINE) IN NACL 1000-0.9 UT/500ML-% IV SOLN
INTRAVENOUS | Status: DC | PRN
  Administered 2023-02-22 (×2): 500 mL

## 2023-02-22 MED ORDER — MIDAZOLAM HCL 2 MG/2ML IJ SOLN
INTRAMUSCULAR | Status: DC | PRN
  Administered 2023-02-22 (×3): 1 mg via INTRAVENOUS
  Administered 2023-02-22: 2 mg via INTRAVENOUS

## 2023-02-22 SURGICAL SUPPLY — 38 items
BALLN COYOTE OTW 2X60X150 (BALLOONS) ×2
BALLN COYOTE OTW 4X80X150 (BALLOONS) ×2
BALLN MUSTANG 3X100X135 (BALLOONS) ×4
BALLN MUSTANG 4X60X135 (BALLOONS) ×2
BALLOON COYOTE OTW 2X60X150 (BALLOONS) IMPLANT
BALLOON COYOTE OTW 4X80X150 (BALLOONS) IMPLANT
BALLOON MUSTANG 3X100X135 (BALLOONS) IMPLANT
BALLOON MUSTANG 4X60X135 (BALLOONS) IMPLANT
CATH AURYON ATHERECTOMY 1.5 (CATHETERS) ×2 IMPLANT
CATH CXI 2.6F 65 ANG (CATHETERS) ×2
CATH CXI 2.6F 90 ANG (CATHETERS) ×2
CATH MUSTANG 3X60X135 (BALLOONS) ×2 IMPLANT
CATH OMNI FLUSH 5F 65CM (CATHETERS) ×2 IMPLANT
CATH QUICKCROSS SUPP .035X90CM (MICROCATHETER) ×4 IMPLANT
CATH SPRT ANG 65X2.3FR PLATN (CATHETERS) ×2 IMPLANT
CATH SPRT ANG 90X2.3FR ACPT (CATHETERS) ×2 IMPLANT
CLOSURE MYNX CONTROL 6F/7F (Vascular Products) ×2 IMPLANT
DEVICE ONE SNARE 10MM (MISCELLANEOUS) ×2 IMPLANT
DEVICE TORQUE SEADRAGON GRN (MISCELLANEOUS) ×2 IMPLANT
GUIDEWIRE ANGLED .035X150CM (WIRE) ×2 IMPLANT
KIT ENCORE 26 ADVANTAGE (KITS) ×2 IMPLANT
KIT MICROPUNCTURE NIT STIFF (SHEATH) ×2 IMPLANT
KIT SYRINGE INJ CVI SPIKEX1 (MISCELLANEOUS) ×2 IMPLANT
PAD HEMO NEPTUNE PLUS 2X2 (PAD) ×2 IMPLANT
SET ATX-X65L (MISCELLANEOUS) ×2 IMPLANT
SHEATH CATAPULT 7F 45 MP (SHEATH) ×2 IMPLANT
SHEATH GLIDE SLENDER 4/5FR (SHEATH) ×2 IMPLANT
SHEATH MICROPUNCTURE PEDAL 5FR (SHEATH) ×2 IMPLANT
SHEATH PINNACLE 5F 10CM (SHEATH) ×2 IMPLANT
SHEATH PINNACLE 7F 10CM (SHEATH) ×2 IMPLANT
SHEATH PROBE COVER 6X72 (BAG) ×2 IMPLANT
TRAY PV CATH (CUSTOM PROCEDURE TRAY) ×3 IMPLANT
WIRE ASAHI CONFIANZPRO12 300CM (WIRE) ×2 IMPLANT
WIRE BENTSON .035X145CM (WIRE) ×2 IMPLANT
WIRE G V18X300CM (WIRE) ×4 IMPLANT
WIRE HI TORQ VERSACORE 300 (WIRE) ×2 IMPLANT
WIRE MAILMAN 300CM (WIRE) ×2 IMPLANT
WIRE SPARTACORE .014X300CM (WIRE) ×2 IMPLANT

## 2023-02-22 NOTE — Progress Notes (Signed)
Carotid artery duplex has been completed.   Results can be found under chart review under CV PROC. 02/22/2023 7:26 PM Leyla Soliz RVT, RDMS

## 2023-02-22 NOTE — Progress Notes (Signed)
  Progress Note    02/22/2023 7:24 AM Hospital Day 5  Subjective:  resting, no complaints  afebrile  Vitals:   02/22/23 0025 02/22/23 0349  BP: (!) 110/54 (!) 153/82  Pulse: 84 82  Resp: 19 15  Temp: 97.7 F (36.5 C) 97.6 F (36.4 C)  SpO2: 98% 97%    Physical Exam: General:  no distress Lungs:  non labored Extremities:  gangrenous changes left foot  CBC    Component Value Date/Time   WBC 15.2 (H) 02/22/2023 0320   RBC 3.74 (L) 02/22/2023 0320   HGB 10.7 (L) 02/22/2023 0320   HCT 34.1 (L) 02/22/2023 0320   PLT 272 02/22/2023 0320   MCV 91.2 02/22/2023 0320   MCH 28.6 02/22/2023 0320   MCHC 31.4 02/22/2023 0320   RDW 17.1 (H) 02/22/2023 0320   LYMPHSABS 0.7 02/22/2023 0320   MONOABS 0.9 02/22/2023 0320   EOSABS 0.1 02/22/2023 0320   BASOSABS 0.0 02/22/2023 0320    BMET    Component Value Date/Time   NA 129 (L) 02/22/2023 0320   K 4.6 02/22/2023 0320   CL 90 (L) 02/22/2023 0320   CO2 24 02/22/2023 0320   GLUCOSE 173 (H) 02/22/2023 0320   BUN 45 (H) 02/22/2023 0320   CREATININE 6.72 (H) 02/22/2023 0320   CALCIUM 8.6 (L) 02/22/2023 0320   CALCIUM 8.3 (L) 01/14/2014 1708   GFRNONAA 8 (L) 02/22/2023 0320   GFRAA 10 (L) 07/29/2018 0024    INR    Component Value Date/Time   INR 1.5 (H) 12/12/2020 0805     Intake/Output Summary (Last 24 hours) at 02/22/2023 0724 Last data filed at 02/21/2023 1701 Gross per 24 hour  Intake 120 ml  Output 2000 ml  Net -1880 ml     Assessment/Plan:  71 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  Continue npo. -he has no questions.   Doreatha Massed, PA-C [facility] [phone] 02/22/2023 7:24 AM

## 2023-02-22 NOTE — Progress Notes (Addendum)
PROGRESS NOTE        PATIENT DETAILS Name: [name] Age: 72 y.o. Sex: male Date of Birth: 08-29-1950 Admit Date: 02/17/2023 Admitting Physician Catarina Hartshorn, MD PCP:[facility]  Brief Summary: Patient is a 72 y.o.  male with history of ESRD on HD MWF, DM-2, HTN, CAD s/p CABG-who presented [facility] with left diabetic foot with gangrenous appearing toes-patient was transferred to [facility] for Ortho/vascular surgery evaluation.  Significant events: 9/5>> admit to [facility]-transfer to [facility] from [facility]  Significant studies: 9/5>> x-ray left foot: There is soft tissue thickening of third, fourth, fifth toes-possible associated acro-osteolysis.  Significant microbiology data: None  Procedures:  TTE - 1. Left ventricular ejection fraction, by estimation, is 60 to 65%. The left ventricle has normal function. The left ventricle has no regional wall motion abnormalities. There is mild left ventricular hypertrophy. Left ventricular diastolic parameters are consistent with Grade II diastolic dysfunction (pseudonormalization). There is the interventricular septum is flattened in diastole ('D' shaped left ventricle), consistent with right ventricular volume overload.  2. Right ventricular systolic function is severely reduced. The right ventricular size is severely enlarged. There is moderately elevated pulmonary artery systolic pressure. The estimated right ventricular systolic pressure is 53.2 mmHg.  3. No evidence of mitral valve regurgitation.  4. Tricuspid valve regurgitation is moderate.  5. There is moderate calcification of the aortic valve. Aortic valve regurgitation is not visualized. Mild to moderate aortic valve stenosis.  6. The inferior vena cava is dilated in size with <50% respiratory variability, suggesting right atrial pressure of 15 mmHg.   ABI -   Right: Resting right ankle-brachial index indicates moderate right lower extremity arterial disease.  The right toe-brachial index is absent. Left: Resting left ankle-brachial index indicates severe left lower extremity arterial disease. The left toe-brachial index is absent.  Consults: Vascular surgery Orthopedics Nephrology.   Cardiology  Subjective:   Patient in bed, appears comfortable, denies any headache, no fever, no chest pain or pressure, no shortness of breath , no abdominal pain. No new focal weakness.  Objective: Vitals: Blood pressure (!) 153/82, pulse 82, temperature 97.6 F (36.4 C), temperature source Oral, resp. rate 15, height 6\' 5"  (1.956 m), weight 84.8 kg, SpO2 95%.   Exam:  Awake Alert, No new F.N deficits, Normal affect [place].AT,PERRAL Supple Neck, No JVD,   Symmetrical Chest wall movement, Good air movement bilaterally, CTAB RRR,No Gallops, Rubs or new Murmurs,  +ve B.Sounds, Abd Soft, No tenderness,   Several necrotic looking toes in the left foot.   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  Continue antibiotics for now. DW Dr Tomi Likens 02/22/23  Episode of hypotension night of 02/19/2023, likely due to gastroenteritis causing dehydration in the presence of multiple blood pressure medications including minoxidil.  Blood cultures drawn and are pending, inflammatory markers are stable as before, ental IV fluid given, blood pressure medications adjusted.  He is stable and nontoxic.  Continue to monitor.  Antibiotics have been broadened from vancomycin to vancomycin plus Zosyn on 02/20/2023.  Follow cultures and monitor clinically.  Blood cultures negative till 02/22/2023.  ESRD on HD MWF   Nephrology following, discussed with nephrology on 02/20/2023.  Mildly elevated  troponin.  Monitor trend.  No left-sided chest pain, EKG  unchanged, on aspirin and statin, low-dose beta-blocker if he can tolerate, has history of CAD in the past, echocardiogram this admission shows preserved EF with stable wall motion, appreciate cardiology input.  Reduced RV systolic function with elevated pulmonary artery pressure.  Postcatheterization VQ scan to rule out chronic PEs, will likely be done 02/23/2023 after his angiogram.  History of CAD s/p CABG - kindly see above.  HTN  BP had dropped on multiple medications currently only on beta-blocker and as needed hydralazine.  Blood pressure improving.  Carotid Bruit - Check [place].  HLD  Statin  Nonspecific renal lesions noted on CT scan incidentally.  Outpatient urology follow-up, to be arranged by PCP postdischarge.    DM-2 (A1c 8.4 on 9/5) - Started SSI-follow CBG trend-and start long-acting insulin if needed, DM education  CBG (last 3)  Recent Labs    02/21/23 0814 02/21/23 1251 02/21/23 2018  GLUCAP 217* 223* 140*   Lab Results  Component Value Date   HGBA1C 8.4 (H) 02/17/2023     Code status:   Code Status: Full Code   DVT Prophylaxis: heparin injection 5,000 Units Start: 02/17/23 2200   Family Communication: None at bedside  Disposition Plan: Status is: Inpatient Remains inpatient appropriate because: Severity of illness   Planned Discharge Destination:Home health   Diet: Diet Order             Diet NPO time specified Except for: Ice Chips, Sips with Meds  Diet effective midnight                    MEDICATIONS: Scheduled Meds:  [MAR Hold] amitriptyline  25 mg Oral QHS   [MAR Hold] aspirin EC  325 mg Oral Once per day on Sunday Tuesday Thursday Saturday   [facility] Hold] atorvastatin  80 mg Oral q1800   [MAR Hold] brimonidine  1 drop Both Eyes q AM   [MAR Hold] Chlorhexidine Gluconate Cloth  6 each Topical Q0600   [MAR Hold] heparin  5,000 Units Subcutaneous Q8H   [MAR Hold] influenza vaccine adjuvanted  0.5 mL Intramuscular Tomorrow-1000   [MAR  Hold] insulin aspart  0-9 Units Subcutaneous TID WC   [MAR Hold] latanoprost  2 drop Both Eyes BID   [MAR Hold] loperamide  4 mg Oral Once   [MAR Hold] metoprolol tartrate  25 mg Oral BID   [MAR Hold] sevelamer carbonate  800 mg Oral TID WC   Continuous Infusions:  sodium chloride 10 mL/hr at 02/22/23 0900   [MAR Hold] lactated ringers     [MAR Hold] piperacillin-tazobactam (ZOSYN)  IV 2.25 g (02/22/23 0604)   [MAR Hold] vancomycin     PRN Meds:.sodium chloride, [MAR Hold] acetaminophen **OR** [MAR Hold] acetaminophen, fentaNYL, Heparin (Porcine) in NaCl, heparin sodium (porcine), [MAR Hold] hydrALAZINE, lidocaine (PF), [MAR Hold] melatonin, midazolam, [MAR Hold] ondansetron **OR** [MAR Hold] ondansetron (ZOFRAN) IV, [MAR Hold] prochlorperazine   I have personally reviewed following labs and imaging studies  LABORATORY DATA:  Recent Labs  Lab 02/17/23 1253 02/18/23 0534 02/19/23 0651 02/20/23 0632 02/21/23 0433 02/22/23 0320  WBC 15.2* 11.6* 11.7* 19.5* 18.0* 15.2*  HGB 13.0 12.7* 13.4 11.7* 11.7* 10.7*  HCT 42.4 40.4 43.1 38.9* 36.4* 34.1*  PLT 279 254 299 303 288 272  MCV 96.4 96.4 93.1 97.5 93.1 91.2  MCH 29.5 30.3 28.9 29.3 29.9 28.6  MCHC 30.7 31.4 31.1 30.1 32.1 31.4  RDW 17.1* 17.0* 17.0* 17.2*  17.2* 17.1*  LYMPHSABS 0.8  --  0.7 0.7 0.5* 0.7  MONOABS 1.0  --  0.8 1.2* 1.1* 0.9  EOSABS 0.1  --  0.1 0.0 0.0 0.1  BASOSABS 0.0  --  0.0 0.0 0.0 0.0    Recent Labs  Lab 02/17/23 1253 02/17/23 2034 02/18/23 0534 02/19/23 0651 02/20/23 1191 02/20/23 0646 02/21/23 0433 02/21/23 0438 02/22/23 0320  NA 130*  --  132* 134*  --   --  127*  --  129*  K 4.4  --  5.1 4.8  --   --  5.9*  --  4.6  CL 86*  --  89* 91*  --   --  86*  --  90*  CO2 25  --  25 27  --   --  21*  --  24  ANIONGAP 19*  --  18* 16*  --   --  20*  --  15  GLUCOSE 112*  --  153* 131*  --   --  215*  --  173*  BUN 52*  --  63* 36*  --   --  75*  --  45*  CREATININE 6.80*  --  7.78* 5.82*  --   --   9.34*  --  6.72*  ALBUMIN  --   --  2.3* 2.5*  --   --  2.3*  --   --   CRP  --  22.7*  --  23.7* 21.5*  --  28.7*  --  25.7*  PROCALCITON  --   --   --  2.29 2.24  --  5.18  --  5.71  LATICACIDVEN  --   --   --   --  5.2*  --   --   --   --   HGBA1C  --  8.4*  --   --   --   --   --   --   --   AMMONIA  --   --   --  15 30  --   --  24 24  BNP  --   --   --  2,618.1*  --  1,153.5* 3,350.2*  --  2,554.9*  MG  --   --   --  2.1 2.4  --  2.4  --  2.2  CALCIUM 8.8*  --  8.8* 9.2  --   --  9.4  --  8.6*    Recent Labs  Lab 02/17/23 1253 02/17/23 2034 02/18/23 0534 02/19/23 0651 02/20/23 0632 02/20/23 0646 02/21/23 0433 02/21/23 0438 02/22/23 0320  CRP  --  22.7*  --  23.7* 21.5*  --  28.7*  --  25.7*  PROCALCITON  --   --   --  2.29 2.24  --  5.18  --  5.71  LATICACIDVEN  --   --   --   --  5.2*  --   --   --   --   HGBA1C  --  8.4*  --   --   --   --   --   --   --   AMMONIA  --   --   --  15 30  --   --  24 24  BNP  --   --   --  2,618.1*  --  1,153.5* 3,350.2*  --  2,554.9*  MG  --   --   --  2.1 2.4  --  2.4  --  2.2  CALCIUM 8.8*  --  8.8* 9.2  --   --  9.4  --  8.6*   RADIOLOGY STUDIES/RESULTS: DG Chest Port 1 View  Result Date: 02/22/2023 CLINICAL DATA:  Shortness of breath EXAM: PORTABLE CHEST 1 VIEW COMPARISON:  CXR 02/20/23 FINDINGS: Status post median sternotomy and CABG. Cardiomegaly. Moderate right-sided pleural effusion, unchanged. No pneumothorax. Hazy opacity in the right base could represent atelectasis. No radiographically apparent displaced rib fractures. Visualized upper abdomen is unremarkable. IMPRESSION: 1. Moderate right-sided pleural effusion, unchanged. 2. Hazy opacity in the right base most likely represents atelectasis. Electronically Signed   By: Lorenza Cambridge M.D.   On: 02/22/2023 08:11   ECHOCARDIOGRAM COMPLETE  Result Date: 02/21/2023    ECHOCARDIOGRAM REPORT   Patient Name:   VEERAJ NAEEM Date of Exam: 02/21/2023 Medical Rec #:  [id number]        Height:       77.0 in Accession #:    [id number]      Weight:       180.1 lb Date of Birth:  Jun 20, 1950       BSA:          2.139 m Patient Age:    72 years        BP:           102/68 mmHg Patient Gender: M               HR:           80 bpm. Exam Location:  Inpatient Procedure: 2D Echo, Cardiac Doppler and Color Doppler Indications:    Chest Pain R07.9  History:        Patient has prior history of Echocardiogram examinations, most                 recent 04/08/2022. Previous Myocardial Infarction, Prior CABG,                 Mitral Valve Disease, Signs/Symptoms:Chest Pain; Risk                 Factors:Hypertension, Diabetes, Dyslipidemia and Non-Smoker.  Sonographer:    Aron Baba Referring Phys: Heide Scales [facility]  Sonographer Comments: Suboptimal apical window. Image acquisition challenging due to respiratory motion. IMPRESSIONS  1. Left ventricular ejection fraction, by estimation, is 60 to 65%. The left ventricle has normal function. The left ventricle has no regional wall motion abnormalities. There is mild left ventricular hypertrophy. Left ventricular diastolic parameters are consistent with Grade II diastolic dysfunction (pseudonormalization). There is the interventricular septum is flattened in diastole ('D' shaped left ventricle), consistent with right ventricular volume overload.  2. Right ventricular systolic function is severely reduced. The right ventricular size is severely enlarged. There is moderately elevated pulmonary artery systolic pressure. The estimated right ventricular systolic pressure is 53.2 mmHg.  3. No evidence of mitral valve regurgitation.  4. Tricuspid valve regurgitation is moderate.  5. There is moderate calcification of the aortic valve. Aortic valve regurgitation is not visualized. Mild to moderate aortic valve stenosis.  6. The inferior vena cava is dilated in size with <50% respiratory variability, suggesting right atrial pressure of 15 mmHg.  Conclusion(s)/Recommendation(s): Compared to prior study, there's increased pulmonary htn with RV dysfunction can consider CT PE. FINDINGS  Left Ventricle: Left ventricular ejection fraction, by estimation, is 60 to 65%. The left ventricle has normal function. The left ventricle has no regional wall motion abnormalities. The left ventricular internal cavity size was normal in  size. There is  mild left ventricular hypertrophy. The interventricular septum is flattened in diastole ('D' shaped left ventricle), consistent with right ventricular volume overload. Left ventricular diastolic parameters are consistent with Grade II diastolic dysfunction (pseudonormalization). Right Ventricle: The right ventricular size is severely enlarged. Right ventricular systolic function is severely reduced. There is moderately elevated pulmonary artery systolic pressure. The tricuspid regurgitant velocity is 3.09 m/s, and with an assumed right atrial pressure of 15 mmHg, the estimated right ventricular systolic pressure is 53.2 mmHg. Left Atrium: Left atrial size was normal in size. Right Atrium: Right atrial size was normal in size. Pericardium: There is no evidence of pericardial effusion. Mitral Valve: No evidence of mitral valve regurgitation. Tricuspid Valve: The tricuspid valve is normal in structure. Tricuspid valve regurgitation is moderate. Aortic Valve: There is moderate calcification of the aortic valve. Aortic valve regurgitation is not visualized. Mild to moderate aortic stenosis is present. Aortic valve mean gradient measures 20.4 mmHg. Aortic valve peak gradient measures 36.8 mmHg. Aortic valve area, by VTI measures 1.27 cm. Pulmonic Valve: The pulmonic valve was normal in structure. Pulmonic valve regurgitation is not visualized. Aorta: The aortic root and ascending aorta are structurally normal, with no evidence of dilitation. Venous: The inferior vena cava is dilated in size with less than 50% respiratory variability,  suggesting right atrial pressure of 15 mmHg. IAS/Shunts: No atrial level shunt detected by color flow Doppler.  LEFT VENTRICLE PLAX 2D LVIDd:         4.40 cm   Diastology LVIDs:         3.20 cm   LV e' medial:    5.44 cm/s LV PW:         0.90 cm   LV E/e' medial:  22.4 LV IVS:        1.00 cm   LV e' lateral:   5.98 cm/s LVOT diam:     2.00 cm   LV E/e' lateral: 20.4 LV SV:         57 LV SV Index:   27 LVOT Area:     3.14 cm  RIGHT VENTRICLE RV S prime:     3.59 cm/s TAPSE (M-mode): 0.9 cm LEFT ATRIUM             Index        RIGHT ATRIUM           Index LA diam:        4.20 cm 1.96 cm/m   RA Area:     21.60 cm LA Vol (A2C):   50.5 ml 23.60 ml/m  RA Volume:   59.70 ml  27.90 ml/m LA Vol (A4C):   44.9 ml 20.99 ml/m LA Biplane Vol: 48.7 ml 22.76 ml/m  AORTIC VALVE                     PULMONIC VALVE AV Area (Vmax):    0.96 cm      PR End Diast Vel: 4.24 msec AV Area (Vmean):   1.02 cm AV Area (VTI):     1.27 cm AV Vmax:           303.40 cm/s AV Vmean:          206.800 cm/s AV VTI:            0.450 m AV Peak Grad:      36.8 mmHg AV Mean Grad:      20.4 mmHg LVOT Vmax:  92.50 cm/s LVOT Vmean:        67.100 cm/s LVOT VTI:          0.182 m LVOT/AV VTI ratio: 0.40  AORTA Ao Root diam: 3.70 cm Ao Asc diam:  3.60 cm MITRAL VALVE                TRICUSPID VALVE MV Area (PHT): 4.06 cm     TR Peak grad:   38.2 mmHg MV Decel Time: 187 msec     TR Vmax:        309.00 cm/s MR Peak grad: 3.4 mmHg MR Vmax:      92.00 cm/s    SHUNTS MV E velocity: 122.00 cm/s  Systemic VTI:  0.18 m MV A velocity: 108.00 cm/s  Systemic Diam: 2.00 cm MV E/A ratio:  1.13 [place] signed by Carolan Clines Signature Date/Time: 02/21/2023/11:29:44 AM    Final    [place] EKG SITE RITE  Result Date: 02/20/2023 If Site Rite image not attached, placement could not be confirmed due to current cardiac rhythm.    LOS: 5 days   Signature  -    Susa Raring M.D on 02/22/2023 at 11:14 AM   -  To page go to [email or web address]

## 2023-02-22 NOTE — Progress Notes (Signed)
[facility] Progress Note   Subjective:   S/p angiogram today. Tolerated HD well yesterday with net UF 2L. Reports he is cold, otherwise no concerns. Denies SOB, CP, dizziness, nausea.   Objective Vitals:   02/22/23 1315 02/22/23 1345 02/22/23 1400 02/22/23 1415  BP: 132/70 128/70 (!) 144/70 (!) 158/86  Pulse: 70 71 71 67  Resp: 18 16 13 13   Temp: 98 F (36.7 C)     TempSrc: Temporal     SpO2: 94% 97% 96% 97%  Weight:      Height:       Physical Exam General: Alert male in NAD Heart: RRR, no murmurs, rubs or gallops Lungs: CTA bilaterally, respirations unlabored on RA Abdomen: Soft, non-distended, +BS Extremities: No edema b/l lower extremities Dialysis Access:  LUE AVG +t/b  Additional Objective Labs: Basic Metabolic Panel: Recent Labs  Lab 02/18/23 0534 02/19/23 0651 02/21/23 0433 02/22/23 0320  NA 132* 134* 127* 129*  K 5.1 4.8 5.9* 4.6  CL 89* 91* 86* 90*  CO2 25 27 21* 24  GLUCOSE 153* 131* 215* 173*  BUN 63* 36* 75* 45*  CREATININE 7.78* 5.82* 9.34* 6.72*  CALCIUM 8.8* 9.2 9.4 8.6*  PHOS 7.7* 4.9* 8.5*  --    Liver Function Tests: Recent Labs  Lab 02/18/23 0534 02/19/23 0651 02/21/23 0433  ALBUMIN 2.3* 2.5* 2.3*   No results for input(s): "LIPASE", "AMYLASE" in the last 168 hours. CBC: Recent Labs  Lab 02/18/23 0534 02/19/23 0651 02/20/23 0632 02/21/23 0433 02/22/23 0320  WBC 11.6* 11.7* 19.5* 18.0* 15.2*  NEUTROABS  --  10.0* 17.3* 16.2* 13.3*  HGB 12.7* 13.4 11.7* 11.7* 10.7*  HCT 40.4 43.1 38.9* 36.4* 34.1*  MCV 96.4 93.1 97.5 93.1 91.2  PLT 254 299 303 288 272   Blood Culture    Component Value Date/Time   SDES BLOOD RIGHT HAND 02/20/2023 0746   SPECREQUEST  02/20/2023 0746    BOTTLES DRAWN AEROBIC ONLY Blood Culture adequate volume   CULT  02/20/2023 0746    NO GROWTH 2 DAYS Performed at [facility], [address], [place], [place] [address]    REPTSTATUS PENDING 02/20/2023 [phone]    Cardiac  Enzymes: No results for input(s): "CKTOTAL", "CKMB", "CKMBINDEX", "TROPONINI" in the last 168 hours. CBG: Recent Labs  Lab 02/20/23 2204 02/21/23 0814 02/21/23 1251 02/21/23 2018 02/22/23 1248  GLUCAP 226* 217* 223* 140* 161*   Iron Studies: No results for input(s): "IRON", "TIBC", "TRANSFERRIN", "FERRITIN" in the last 72 hours. @lablastinr3 @ Studies/Results: DG Chest Port 1 View  Result Date: 02/22/2023 CLINICAL DATA:  Shortness of breath EXAM: PORTABLE CHEST 1 VIEW COMPARISON:  CXR 02/20/23 FINDINGS: Status post median sternotomy and CABG. Cardiomegaly. Moderate right-sided pleural effusion, unchanged. No pneumothorax. Hazy opacity in the right base could represent atelectasis. No radiographically apparent displaced rib fractures. Visualized upper abdomen is unremarkable. IMPRESSION: 1. Moderate right-sided pleural effusion, unchanged. 2. Hazy opacity in the right base most likely represents atelectasis. Electronically Signed   By: Lorenza Cambridge M.D.   On: 02/22/2023 08:11   ECHOCARDIOGRAM COMPLETE  Result Date: 02/21/2023    ECHOCARDIOGRAM REPORT   Patient Name:   [name] Date of Exam: 02/21/2023 Medical Rec #:  [id number]       Height:       77.0 in Accession #:    [id number]      Weight:       180.1 lb Date of Birth:  1950-10-10  BSA:          2.139 m Patient Age:    72 years        BP:           102/68 mmHg Patient Gender: M               HR:           80 bpm. Exam Location:  Inpatient Procedure: 2D Echo, Cardiac Doppler and Color Doppler Indications:    Chest Pain R07.9  History:        Patient has prior history of Echocardiogram examinations, most                 recent 04/08/2022. Previous Myocardial Infarction, Prior CABG,                 Mitral Valve Disease, Signs/Symptoms:Chest Pain; Risk                 Factors:Hypertension, Diabetes, Dyslipidemia and Non-Smoker.  Sonographer:    Aron Baba Referring Phys: Heide Scales [facility]  Sonographer Comments: Suboptimal apical  window. Image acquisition challenging due to respiratory motion. IMPRESSIONS  1. Left ventricular ejection fraction, by estimation, is 60 to 65%. The left ventricle has normal function. The left ventricle has no regional wall motion abnormalities. There is mild left ventricular hypertrophy. Left ventricular diastolic parameters are consistent with Grade II diastolic dysfunction (pseudonormalization). There is the interventricular septum is flattened in diastole ('D' shaped left ventricle), consistent with right ventricular volume overload.  2. Right ventricular systolic function is severely reduced. The right ventricular size is severely enlarged. There is moderately elevated pulmonary artery systolic pressure. The estimated right ventricular systolic pressure is 53.2 mmHg.  3. No evidence of mitral valve regurgitation.  4. Tricuspid valve regurgitation is moderate.  5. There is moderate calcification of the aortic valve. Aortic valve regurgitation is not visualized. Mild to moderate aortic valve stenosis.  6. The inferior vena cava is dilated in size with <50% respiratory variability, suggesting right atrial pressure of 15 mmHg. Conclusion(s)/Recommendation(s): Compared to prior study, there's increased pulmonary htn with RV dysfunction can consider CT PE. FINDINGS  Left Ventricle: Left ventricular ejection fraction, by estimation, is 60 to 65%. The left ventricle has normal function. The left ventricle has no regional wall motion abnormalities. The left ventricular internal cavity size was normal in size. There is  mild left ventricular hypertrophy. The interventricular septum is flattened in diastole ('D' shaped left ventricle), consistent with right ventricular volume overload. Left ventricular diastolic parameters are consistent with Grade II diastolic dysfunction (pseudonormalization). Right Ventricle: The right ventricular size is severely enlarged. Right ventricular systolic function is severely reduced.  There is moderately elevated pulmonary artery systolic pressure. The tricuspid regurgitant velocity is 3.09 m/s, and with an assumed right atrial pressure of 15 mmHg, the estimated right ventricular systolic pressure is 53.2 mmHg. Left Atrium: Left atrial size was normal in size. Right Atrium: Right atrial size was normal in size. Pericardium: There is no evidence of pericardial effusion. Mitral Valve: No evidence of mitral valve regurgitation. Tricuspid Valve: The tricuspid valve is normal in structure. Tricuspid valve regurgitation is moderate. Aortic Valve: There is moderate calcification of the aortic valve. Aortic valve regurgitation is not visualized. Mild to moderate aortic stenosis is present. Aortic valve mean gradient measures 20.4 mmHg. Aortic valve peak gradient measures 36.8 mmHg. Aortic valve area, by VTI measures 1.27 cm. Pulmonic Valve: The pulmonic valve was normal in structure. Pulmonic valve  regurgitation is not visualized. Aorta: The aortic root and ascending aorta are structurally normal, with no evidence of dilitation. Venous: The inferior vena cava is dilated in size with less than 50% respiratory variability, suggesting right atrial pressure of 15 mmHg. IAS/Shunts: No atrial level shunt detected by color flow Doppler.  LEFT VENTRICLE PLAX 2D LVIDd:         4.40 cm   Diastology LVIDs:         3.20 cm   LV e' medial:    5.44 cm/s LV PW:         0.90 cm   LV E/e' medial:  22.4 LV IVS:        1.00 cm   LV e' lateral:   5.98 cm/s LVOT diam:     2.00 cm   LV E/e' lateral: 20.4 LV SV:         57 LV SV Index:   27 LVOT Area:     3.14 cm  RIGHT VENTRICLE RV S prime:     3.59 cm/s TAPSE (M-mode): 0.9 cm LEFT ATRIUM             Index        RIGHT ATRIUM           Index LA diam:        4.20 cm 1.96 cm/m   RA Area:     21.60 cm LA Vol (A2C):   50.5 ml 23.60 ml/m  RA Volume:   59.70 ml  27.90 ml/m LA Vol (A4C):   44.9 ml 20.99 ml/m LA Biplane Vol: 48.7 ml 22.76 ml/m  AORTIC VALVE                      PULMONIC VALVE AV Area (Vmax):    0.96 cm      PR End Diast Vel: 4.24 msec AV Area (Vmean):   1.02 cm AV Area (VTI):     1.27 cm AV Vmax:           303.40 cm/s AV Vmean:          206.800 cm/s AV VTI:            0.450 m AV Peak Grad:      36.8 mmHg AV Mean Grad:      20.4 mmHg LVOT Vmax:         92.50 cm/s LVOT Vmean:        67.100 cm/s LVOT VTI:          0.182 m LVOT/AV VTI ratio: 0.40  AORTA Ao Root diam: 3.70 cm Ao Asc diam:  3.60 cm MITRAL VALVE                TRICUSPID VALVE MV Area (PHT): 4.06 cm     TR Peak grad:   38.2 mmHg MV Decel Time: 187 msec     TR Vmax:        309.00 cm/s MR Peak grad: 3.4 mmHg MR Vmax:      92.00 cm/s    SHUNTS MV E velocity: 122.00 cm/s  Systemic VTI:  0.18 m MV A velocity: 108.00 cm/s  Systemic Diam: 2.00 cm MV E/A ratio:  1.13 Photographer signed by Carolan Clines Signature Date/Time: 02/21/2023/11:29:44 AM    Final    [place] EKG SITE RITE  Result Date: 02/20/2023 If Site Rite image not attached, placement could not be confirmed due to current cardiac rhythm.  Medications:  sodium chloride  10 mL/hr at 02/22/23 0900   sodium chloride     [MAR Hold] lactated ringers     [MAR Hold] piperacillin-tazobactam (ZOSYN)  IV 2.25 g (02/22/23 0604)   [MAR Hold] vancomycin      [MAR Hold] amitriptyline  25 mg Oral QHS   aspirin       [MAR Hold] aspirin EC  325 mg Oral Once per day on Sunday Tuesday Thursday Saturday   aspirin EC  81 mg Oral Daily   [MAR Hold] atorvastatin  80 mg Oral q1800   [MAR Hold] brimonidine  1 drop Both Eyes q AM   [MAR Hold] Chlorhexidine Gluconate Cloth  6 each Topical Q0600   [START ON 02/23/2023] clopidogrel  75 mg Oral Q breakfast   [MAR Hold] heparin  5,000 Units Subcutaneous Q8H   [MAR Hold] influenza vaccine adjuvanted  0.5 mL Intramuscular Tomorrow-1000   [MAR Hold] insulin aspart  0-9 Units Subcutaneous TID WC   [MAR Hold] latanoprost  2 drop Both Eyes BID   [MAR Hold] loperamide  4 mg Oral Once   [MAR Hold] metoprolol tartrate   25 mg Oral BID   rosuvastatin  10 mg Oral Daily   [MAR Hold] sevelamer carbonate  800 mg Oral TID WC   sodium chloride flush  3 mL Intravenous Q12H    Outpatient Dialysis Orders: [facility]) - MWF Time: 4h 15 min Qb: 500, Qd: 500 Bath: 2k/2Ca+ EDW: 82kg Heparin 2000u at start  Assessment/Plan: .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 On ABXs; s/p angiogram on 9/10 per [facility] 2.Hypotension - Informed of hypotensive episode overnight Saturday. LR and 1x Midodrine dose were given. Hydralazine/Minoxidil were stopped. He remains on Metoprolol BID. Bps improved and patient feels better.  3. Possible PNA - Per recent CT, blood cultures drawn today, on ABXs, managed per primary 4. ESRD: On HD MWF. Due for HD today. CT chest showed numerous renal lesions BL. Per hospitalist, plan to schedule outpatient urology follow-up at discharge for further evaluation. 5. Hyperkalemia: Resolved with HD.  6. HTN/Vol - Bps stable here currently. Na low but improving. UF 2-3L tomorrow as tolerated. Per OP HD Nurse, he has a history of hypotension when nearing EDW. See #2. 7. Metabolic bone disease: Phos was 7.7 at admit so binders started. Phos 8.5, CTM for now. Calcium slightly elevated, not on VDRA 8. Anemia of CKD: Hgb at goal. Fe/ESA is not indicated at this time. 9. DM2: Managed by hospital service.  10. History CAD s/p CABG - on ASA/statin, troponins and BNP are trending up here, ECHO ordered and awaiting results, managed by primary  Rogers Blocker, PA-C 02/22/2023, 2:46 PM  [facility] Pager: [phone]

## 2023-02-22 NOTE — Progress Notes (Signed)
Subjective:   For LE angiogram today no cardiac symptoms   Objective:  Vitals:   02/21/23 1716 02/21/23 1948 02/22/23 0025 02/22/23 0349  BP:  (!) 145/63 (!) 110/54 (!) 153/82  Pulse:  90 84 82  Resp:  20 19 15   Temp:  97.7 F (36.5 C) 97.7 F (36.5 C) 97.6 F (36.4 C)  TempSrc:  Axillary Oral Oral  SpO2:  91% 98% 97%  Weight: 84.8 kg     Height:        Intake/Output from previous day:  Intake/Output Summary (Last 24 hours) at 02/22/2023 0711 Last data filed at 02/21/2023 1701 Gross per 24 hour  Intake 120 ml  Output 2000 ml  Net -1880 ml    Physical Exam:  Left carotid bruit SEM  murmur Lungs clear  Decreased peripheral pulses with gangrene dry Left 205 toes Abdomen benign  No edema  Lab Results: Basic Metabolic Panel: Recent Labs    02/21/23 0433 02/22/23 0320  NA 127* 129*  K 5.9* 4.6  CL 86* 90*  CO2 21* 24  GLUCOSE 215* 173*  BUN 75* 45*  CREATININE 9.34* 6.72*  CALCIUM 9.4 8.6*  MG 2.4 2.2  PHOS 8.5*  --    Liver Function Tests: Recent Labs    02/21/23 0433  ALBUMIN 2.3*   No results for input(s): "LIPASE", "AMYLASE" in the last 72 hours. CBC: Recent Labs    02/21/23 0433 02/22/23 0320  WBC 18.0* 15.2*  NEUTROABS 16.2* 13.3*  HGB 11.7* 10.7*  HCT 36.4* 34.1*  MCV 93.1 91.2  PLT 288 272    Imaging: ECHOCARDIOGRAM COMPLETE  Result Date: 02/21/2023    ECHOCARDIOGRAM REPORT   Patient Name:   [name] Date of Exam: 02/21/2023 Medical Rec #:  [id number]       Height:       77.0 in Accession #:    [id number]      Weight:       180.1 lb Date of Birth:  1950-09-03       BSA:          2.139 m Patient Age:    72 years        BP:           102/68 mmHg Patient Gender: M               HR:           80 bpm. Exam Location:  Inpatient Procedure: 2D Echo, Cardiac Doppler and Color Doppler Indications:    Chest Pain R07.9  History:        Patient has prior history of Echocardiogram examinations, most                 recent 04/08/2022. Previous  Myocardial Infarction, Prior CABG,                 Mitral Valve Disease, Signs/Symptoms:Chest Pain; Risk                 Factors:Hypertension, Diabetes, Dyslipidemia and Non-Smoker.  Sonographer:    Aron Baba Referring Phys: Heide Scales [facility]  Sonographer Comments: Suboptimal apical window. Image acquisition challenging due to respiratory motion. IMPRESSIONS  1. Left ventricular ejection fraction, by estimation, is 60 to 65%. The left ventricle has normal function. The left ventricle has no regional wall motion abnormalities. There is mild left ventricular hypertrophy. Left ventricular diastolic parameters are consistent with Grade II diastolic dysfunction (pseudonormalization). There is the interventricular  septum is flattened in diastole ('D' shaped left ventricle), consistent with right ventricular volume overload.  2. Right ventricular systolic function is severely reduced. The right ventricular size is severely enlarged. There is moderately elevated pulmonary artery systolic pressure. The estimated right ventricular systolic pressure is 53.2 mmHg.  3. No evidence of mitral valve regurgitation.  4. Tricuspid valve regurgitation is moderate.  5. There is moderate calcification of the aortic valve. Aortic valve regurgitation is not visualized. Mild to moderate aortic valve stenosis.  6. The inferior vena cava is dilated in size with <50% respiratory variability, suggesting right atrial pressure of 15 mmHg. Conclusion(s)/Recommendation(s): Compared to prior study, there's increased pulmonary htn with RV dysfunction can consider CT PE. FINDINGS  Left Ventricle: Left ventricular ejection fraction, by estimation, is 60 to 65%. The left ventricle has normal function. The left ventricle has no regional wall motion abnormalities. The left ventricular internal cavity size was normal in size. There is  mild left ventricular hypertrophy. The interventricular septum is flattened in diastole ('D' shaped left  ventricle), consistent with right ventricular volume overload. Left ventricular diastolic parameters are consistent with Grade II diastolic dysfunction (pseudonormalization). Right Ventricle: The right ventricular size is severely enlarged. Right ventricular systolic function is severely reduced. There is moderately elevated pulmonary artery systolic pressure. The tricuspid regurgitant velocity is 3.09 m/s, and with an assumed right atrial pressure of 15 mmHg, the estimated right ventricular systolic pressure is 53.2 mmHg. Left Atrium: Left atrial size was normal in size. Right Atrium: Right atrial size was normal in size. Pericardium: There is no evidence of pericardial effusion. Mitral Valve: No evidence of mitral valve regurgitation. Tricuspid Valve: The tricuspid valve is normal in structure. Tricuspid valve regurgitation is moderate. Aortic Valve: There is moderate calcification of the aortic valve. Aortic valve regurgitation is not visualized. Mild to moderate aortic stenosis is present. Aortic valve mean gradient measures 20.4 mmHg. Aortic valve peak gradient measures 36.8 mmHg. Aortic valve area, by VTI measures 1.27 cm. Pulmonic Valve: The pulmonic valve was normal in structure. Pulmonic valve regurgitation is not visualized. Aorta: The aortic root and ascending aorta are structurally normal, with no evidence of dilitation. Venous: The inferior vena cava is dilated in size with less than 50% respiratory variability, suggesting right atrial pressure of 15 mmHg. IAS/Shunts: No atrial level shunt detected by color flow Doppler.  LEFT VENTRICLE PLAX 2D LVIDd:         4.40 cm   Diastology LVIDs:         3.20 cm   LV e' medial:    5.44 cm/s LV PW:         0.90 cm   LV E/e' medial:  22.4 LV IVS:        1.00 cm   LV e' lateral:   5.98 cm/s LVOT diam:     2.00 cm   LV E/e' lateral: 20.4 LV SV:         57 LV SV Index:   27 LVOT Area:     3.14 cm  RIGHT VENTRICLE RV S prime:     3.59 cm/s TAPSE (M-mode): 0.9 cm LEFT  ATRIUM             Index        RIGHT ATRIUM           Index LA diam:        4.20 cm 1.96 cm/m   RA Area:     21.60 cm LA Vol (A2C):  50.5 ml 23.60 ml/m  RA Volume:   59.70 ml  27.90 ml/m LA Vol (A4C):   44.9 ml 20.99 ml/m LA Biplane Vol: 48.7 ml 22.76 ml/m  AORTIC VALVE                     PULMONIC VALVE AV Area (Vmax):    0.96 cm      PR End Diast Vel: 4.24 msec AV Area (Vmean):   1.02 cm AV Area (VTI):     1.27 cm AV Vmax:           303.40 cm/s AV Vmean:          206.800 cm/s AV VTI:            0.450 m AV Peak Grad:      36.8 mmHg AV Mean Grad:      20.4 mmHg LVOT Vmax:         92.50 cm/s LVOT Vmean:        67.100 cm/s LVOT VTI:          0.182 m LVOT/AV VTI ratio: 0.40  AORTA Ao Root diam: 3.70 cm Ao Asc diam:  3.60 cm MITRAL VALVE                TRICUSPID VALVE MV Area (PHT): 4.06 cm     TR Peak grad:   38.2 mmHg MV Decel Time: 187 msec     TR Vmax:        309.00 cm/s MR Peak grad: 3.4 mmHg MR Vmax:      92.00 cm/s    SHUNTS MV E velocity: 122.00 cm/s  Systemic VTI:  0.18 m MV A velocity: 108.00 cm/s  Systemic Diam: 2.00 cm MV E/A ratio:  1.13 [place] signed by Carolan Clines Signature Date/Time: 02/21/2023/11:29:44 AM    Final    [place] EKG SITE RITE  Result Date: 02/20/2023 If Site Rite image not attached, placement could not be confirmed due to current cardiac rhythm.   Cardiac Studies:  ECG: SR RBBB chronic no acute changes    Telemetry:  NSR  Echo: 04/08/22 EF 60-65% 02/21/23 EF 60-65% RVE mild/mod AS   Medications:    amitriptyline  25 mg Oral QHS   aspirin EC  325 mg Oral Once per day on Sunday Tuesday Thursday Saturday   atorvastatin  80 mg Oral q1800   brimonidine  1 drop Both Eyes q AM   Chlorhexidine Gluconate Cloth  6 each Topical Q0600   heparin  5,000 Units Subcutaneous Q8H   influenza vaccine adjuvanted  0.5 mL Intramuscular Tomorrow-1000   insulin aspart  0-9 Units Subcutaneous TID WC   latanoprost  2 drop Both Eyes BID   loperamide  4 mg Oral Once    metoprolol tartrate  25 mg Oral BID   sevelamer carbonate  800 mg Oral TID WC      lactated ringers     piperacillin-tazobactam (ZOSYN)  IV 2.25 g (02/22/23 0604)   [START ON 02/23/2023] vancomycin      Assessment/Plan:   Elevated Troponin:  need to repeat initial levels 122->245 then jumped to 1813 no chest pain or acute ECG changes. TTE with no RWMA and normal EF  Continue ASA/statin and Valle heparin. Distant history of CABG 2016 given gangrene of LEls and lack of symptoms would proceed with LE angiogram in am. No benefit to delaying any revascularization or amputation to avoid sepsis unless TTE shows marked change in EF.  Would check D dimer and do CTA to r/o pulmonary embolus as alternative cause of elevated troponin given RVE and elevated PA pressures on TTE Carotid Bruit:  left needs updated duplex ordered  Gangrene:  Continue Zosyn coverage PAD angiogram tomorrow with Dr Sherral Hammers  HLD:  continue statin   Per primary service order CTA r/o PE and carotids US still pending   Charlton Haws 02/22/2023, 7:11 AM

## 2023-02-22 NOTE — Plan of Care (Signed)
  Problem: Education: Goal: Knowledge of General Education information will improve Description: Including pain rating scale, medication(s)/side effects and non-pharmacologic comfort measures Outcome: Progressing   Problem: Clinical Measurements: Goal: Ability to maintain clinical measurements within normal limits will improve Outcome: Progressing Goal: Will remain free from infection Outcome: Progressing   Problem: Activity: Goal: Risk for activity intolerance will decrease Outcome: Progressing   Problem: Pain Managment: Goal: General experience of comfort will improve Outcome: Progressing   Problem: Safety: Goal: Ability to remain free from injury will improve Outcome: Progressing   Problem: Skin Integrity: Goal: Risk for impaired skin integrity will decrease Outcome: Progressing   

## 2023-02-22 NOTE — Interval H&P Note (Signed)
History and Physical Interval Note:  02/22/2023 7:35 AM  [name]  has presented today for surgery, with the diagnosis of ischemia w/ grangren.  The various methods of treatment have been discussed with the patient and family. After consideration of risks, benefits and other options for treatment, the patient has consented to  Procedure(s): ABDOMINAL AORTOGRAM W/LOWER EXTREMITY (N/A) as a surgical intervention.  The patient's history has been reviewed, patient examined, no change in status, stable for surgery.  I have reviewed the patient's chart and labs.  Questions were answered to the patient's satisfaction.     [name]

## 2023-02-22 NOTE — Plan of Care (Signed)

## 2023-02-22 NOTE — Op Note (Signed)
Patient name: [name] MRN: [id number] DOB: [birth year] Sex: male  02/17/2023 - 02/22/2023 Pre-operative Diagnosis: Left foot ulcer Post-operative diagnosis:  Same Surgeon:  [name] Procedure Performed:  1.  Ultrasound-guided access, right femoral artery  2.  Ultrasound-guided access, left posterior tibial artery  3.  Abdominal aortogram  4.  Left leg angiogram  5.  Selective images with the catheter in the left superficial femoral artery from the right femoral artery  6.  Selective angiogram with catheter in the posterior tibial artery  7.  Laser atherectomy and angioplasty left superficial femoral and popliteal artery  8.  Angioplasty, left posterior tibial artery and tibioperoneal trunk  9.  Closure device, Mynx  10. Conscious sedation, 219 munutes   Indications: This is a 72 year old gentleman with end-stage renal disease who has a nonhealing left foot wound and severe arterial compromise.  He comes in today for further evaluation of possible intervention  Procedure:  The patient was identified in the holding area and taken to room 8.  The patient was then placed supine on the table and prepped and draped in the usual sterile fashion.  A time out was called.  Conscious sedation was administered with the use of IV fentanyl and Versed under continuous physician and nurse monitoring.  Heart rate, blood pressure, and oxygen saturation were continuously monitored.  Total sedation time was .  Ultrasound was used to evaluate the right common femoral artery.  It was patent .  A digital ultrasound image was acquired.  A micropuncture needle was used to access the right common femoral artery under ultrasound guidance.  An 018 wire was advanced without resistance and a micropuncture sheath was placed.  The 018 wire was removed and a benson wire was placed.  The micropuncture sheath was exchanged for a 5 french sheath.  An omniflush catheter was advanced over the wire to the level  of L-1.  An abdominal angiogram was obtained.  Next, using the omniflush catheter and a benson wire, the aortic bifurcation was crossed and the catheter was placed into theleft external iliac artery and left runoff was obtained.  Findings:   Aortogram: No significant renal artery stenosis was visualized.  The infrarenal abdominal aorta is calcified but patent without significant stenosis.  Bilateral common and external iliac arteries are widely patent.  Bilateral common femoral arteries are calcified but patent without significant stenosis.  Right Lower Extremity: Not evaluated.  Left Lower Extremity: The left common femoral and profundofemoral artery are widely patent.  The superficial femoral artery is of relatively minimal disease down to just above the adductor canal where it becomes heavily calcified with multiple areas of greater than 90% stenosis that persist through the popliteal artery above the knee and across the joint space.  The below-knee popliteal artery has luminal irregularities.  It is fairly small in caliber.  The tibioperoneal trunk is patent with moderate calcific disease.  The posterior tibial is the dominant runoff to the peroneal and anterior tibial artery are occluded.  Intervention: After the above images were acquired the decision was made to proceed with intervention.  A 7 French 45 cm sheath was advanced into the left superficial femoral artery.  The patient was fully heparinized.  Additional injections were performed with the catheter in the superficial femoral artery to better define the anatomy.  I tried to use a 035 Glidewire and quick cross to cross the stenosis.  Because of the heavily calcified lesion, I was unable to advance the catheter  past the bone edge.  I did not think this was going to be feasible and so I elected to get pedal access.  The left posterior tibial artery was evaluated with ultrasound.  He had mild calcific changes.  It was cannulated under ultrasound  guidance with a micropuncture needle.  A 018 wire was inserted followed placement of a pedal access sheath.  Next using a V-18 wire and had a CSI catheter, with significant difficulty I was able to get access into the superficial femoral artery.  I then swapped out for a [place] core 014 wire and snared it with a 10 mm snare and brought it out through the sheath in the right groin establishing through and through access.  I then tried to perform laser atherectomy.  I was unable to advance the laser catheter past the first calcific island.  I ended up having to change out 014 wires multiple times, having to snare it each time.  I did not feel that because of the calcific disease that I was going to be able to get the laser all the way across and so I ended up predilated this with a 2 mm balloon.  I was unable to advance the 015 laser to treat the above-knee popliteal and superficial femoral artery.  I then switched out to a 035 wire, continuing with through and through access.  I performed balloon angioplasty of the above-knee popliteal, superficial femoral, and popliteal artery with a 4 mm balloon.  I then inserted a 3 mm balloon to treat the tibioperoneal trunk and posterior tibial artery.  Upon attempting to remove the balloon, the balloon snapped off from the elbow.  I was unable to save wire access and safely remove the balloon and so I gave up wire access.  At this point I performed completion imaging.  I had inline flow through the superficial femoral-popliteal artery with minimal residual stenosis, less than 20%.  The tibioperoneal trunk was widely patent as well as the posterior tibial artery.  I felt that this was sufficient for now, given the complexity and difficulty treating the lesions.  I closed the right groin with a Mynx and manual compression was held behind the ankle for the posterior tibial closure  Impression:  #1  Heavily calcified left superficial femoral and popliteal artery with multiple  areas of high-grade stenosis.  I had significant difficulty crossing these lesions even with through and through access via the posterior tibial artery.  Ultimately I was able to treat the superficial femoral and above-knee popliteal artery with a 1.5 laser catheter.  I have ballooned the superficial femoral and popliteal artery with a 4 mm balloon and the tibioperoneal trunk and posterior tibial with a 3 mm balloon.  The patient now has inline flow via the posterior tibial artery down to the foot  #2  Diffuse disease out onto the foot.  #3  Patient has been maximally revascularized for now.  He may require additional interventions in the future for recurrent stenosis, given that he was unable to be stented today   V. [name], M.D., [facility] [place] Office: [phone] Pager:  [phone]

## 2023-02-22 NOTE — Progress Notes (Signed)
PT Cancellation Note  Patient Details Name: [name] MRN: [id number] DOB: [birth year]   Cancelled Treatment:    Reason Eval/Treat Not Completed: (P) Patient at procedure or test/unavailable (pt at cath lab for L abdominal aortogram.) Will continue efforts per PT plan of care as schedule permits.   Dorathy Kinsman Deontrae Drinkard 02/22/2023, 10:32 AM

## 2023-02-23 ENCOUNTER — Inpatient Hospital Stay (HOSPITAL_COMMUNITY): Payer: Medicare Other

## 2023-02-23 ENCOUNTER — Encounter (HOSPITAL_COMMUNITY): Payer: Self-pay | Admitting: Surgery

## 2023-02-23 DIAGNOSIS — E871 Hypo-osmolality and hyponatremia: Secondary | ICD-10-CM | POA: Diagnosis not present

## 2023-02-23 DIAGNOSIS — E11628 Type 2 diabetes mellitus with other skin complications: Secondary | ICD-10-CM | POA: Diagnosis not present

## 2023-02-23 DIAGNOSIS — I96 Gangrene, not elsewhere classified: Secondary | ICD-10-CM | POA: Diagnosis not present

## 2023-02-23 DIAGNOSIS — R7989 Other specified abnormal findings of blood chemistry: Secondary | ICD-10-CM | POA: Diagnosis not present

## 2023-02-23 LAB — LIPID PANEL
Cholesterol: 96 mg/dL (ref 0–200)
HDL: 37 mg/dL — ABNORMAL LOW (ref >40)
LDL Cholesterol: 38 mg/dL (ref 0–99)
Total CHOL/HDL Ratio: 2.6 ratio
Triglycerides: 104 mg/dL (ref <150)
VLDL: 21 mg/dL (ref 0–40)

## 2023-02-23 LAB — CBC WITH DIFFERENTIAL/PLATELET
Abs Immature Granulocytes: 0.14 10*3/uL — ABNORMAL HIGH (ref 0.00–0.07)
Basophils Absolute: 0.1 10*3/uL (ref 0.0–0.1)
Basophils Relative: 0 %
Eosinophils Absolute: 0.1 10*3/uL (ref 0.0–0.5)
Eosinophils Relative: 1 %
HCT: 37.6 % — ABNORMAL LOW (ref 39.0–52.0)
Hemoglobin: 11.7 g/dL — ABNORMAL LOW (ref 13.0–17.0)
Immature Granulocytes: 1 %
Lymphocytes Relative: 3 %
Lymphs Abs: 0.6 10*3/uL — ABNORMAL LOW (ref 0.7–4.0)
MCH: 29.8 pg (ref 26.0–34.0)
MCHC: 31.1 g/dL (ref 30.0–36.0)
MCV: 95.9 fL (ref 80.0–100.0)
Monocytes Absolute: 1.2 10*3/uL — ABNORMAL HIGH (ref 0.1–1.0)
Monocytes Relative: 7 %
Neutro Abs: 15.9 10*3/uL — ABNORMAL HIGH (ref 1.7–7.7)
Neutrophils Relative %: 88 %
Platelets: 299 10*3/uL (ref 150–400)
RBC: 3.92 MIL/uL — ABNORMAL LOW (ref 4.22–5.81)
RDW: 17.2 % — ABNORMAL HIGH (ref 11.5–15.5)
WBC: 18 10*3/uL — ABNORMAL HIGH (ref 4.0–10.5)
nRBC: 0 % (ref 0.0–0.2)

## 2023-02-23 LAB — AMMONIA: Ammonia: 26 umol/L (ref 9–35)

## 2023-02-23 LAB — TROPONIN I (HIGH SENSITIVITY): Troponin I (High Sensitivity): 6707 ng/L (ref <18)

## 2023-02-23 LAB — RENAL FUNCTION PANEL
Albumin: 2 g/dL — ABNORMAL LOW (ref 3.5–5.0)
Anion gap: 17 — ABNORMAL HIGH (ref 5–15)
BUN: 68 mg/dL — ABNORMAL HIGH (ref 8–23)
CO2: 21 mmol/L — ABNORMAL LOW (ref 22–32)
Calcium: 8.3 mg/dL — ABNORMAL LOW (ref 8.9–10.3)
Chloride: 90 mmol/L — ABNORMAL LOW (ref 98–111)
Creatinine, Ser: 9.13 mg/dL — ABNORMAL HIGH (ref 0.61–1.24)
GFR, Estimated: 6 mL/min — ABNORMAL LOW (ref >60)
Glucose, Bld: 71 mg/dL (ref 70–99)
Phosphorus: 8.3 mg/dL — ABNORMAL HIGH (ref 2.5–4.6)
Potassium: 4.7 mmol/L (ref 3.5–5.1)
Sodium: 128 mmol/L — ABNORMAL LOW (ref 135–145)

## 2023-02-23 LAB — GLUCOSE, CAPILLARY
Glucose-Capillary: 261 mg/dL — ABNORMAL HIGH (ref 70–99)
Glucose-Capillary: 211 mg/dL — ABNORMAL HIGH (ref 70–99)
Glucose-Capillary: 73 mg/dL (ref 70–99)
Glucose-Capillary: 133 mg/dL — ABNORMAL HIGH (ref 70–99)

## 2023-02-23 LAB — PROCALCITONIN: Procalcitonin: 5.1 ng/mL

## 2023-02-23 MED ORDER — PENTAFLUOROPROP-TETRAFLUOROETH EX AERO: 1.0000 | INHALATION_SPRAY | CUTANEOUS | Status: DC | PRN

## 2023-02-23 MED ORDER — TECHNETIUM TO 99M ALBUMIN AGGREGATED
4.4000 | Freq: Once | INTRAVENOUS | Status: AC | PRN
  Administered 2023-02-23: 4.4 via INTRAVENOUS

## 2023-02-23 MED ORDER — HEPARIN (PORCINE) 25000 UT/250ML-% IV SOLN
1550.0000 [IU]/h | INTRAVENOUS | Status: DC
  Administered 2023-02-23: 1300 [IU]/h via INTRAVENOUS
  Administered 2023-02-24: 1400 [IU]/h via INTRAVENOUS
  Administered 2023-02-25: 1550 [IU]/h via INTRAVENOUS
  Filled 2023-02-23 (×3): qty 250

## 2023-02-23 MED ORDER — HEPARIN BOLUS VIA INFUSION
2000.0000 [IU] | Freq: Once | INTRAVENOUS | Status: AC
  Administered 2023-02-23: 2000 [IU] via INTRAVENOUS
  Filled 2023-02-23: qty 2000

## 2023-02-23 MED FILL — Lidocaine HCl Local Preservative Free (PF) Inj 1%: INTRAMUSCULAR | Qty: 30 | Status: AC

## 2023-02-23 NOTE — Progress Notes (Addendum)
RE: [name]  Date of Birth: [birth year]  Date 02/23/2023  To Whom It May Concern:  Please be advised that the above-named patient will require a short-term nursing home stay - anticipated 30 days or less for rehabilitation and strengthening. The plan is for return home.

## 2023-02-23 NOTE — Progress Notes (Signed)
[facility]  PROGRESS NOTE  [name] BJY:[id number] DOB: 1951-02-14 DOA: 02/17/2023 PCP: [facility]   Brief HPI:    72 y.o.  male with history of ESRD on HD MWF, DM-2, HTN, CAD s/p CABG-who presented [facility] with left diabetic foot with gangrenous appearing toes-patient was transferred to [facility] for vascular surgery evaluation.      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 Logan Bores as outpatient -X-ray of the foot did not show osteomyelitis -Will discuss with vascular surgery regarding antibiotics on discharge  Troponin elevation -Was elevated to 1800, cardiology was consulted -No chest pain, EKG nonischemic -Echocardiogram showed preserved EF with stable wall motion abnormality -Follow cardiology recommendations  Episode of hypotension -Patient had episode of hypotension on 02/19/2023 -Started on antibiotics for possible sepsis -Blood cultures x 2 obtained on 02/20/2023 are negative to date  History of CAD status post CABG  ESRD -Patient on hemodialysis Monday Wednesday Friday -Nephrology following  Hypertension -Blood pressure dropped on multiple medications -Currently on beta-blocker and as needed hydralazine  Carotid bruit -Right ICA consistent with 40 to 59% stenosis -Left ICA consistent with 1 to 39% stenosis -No acute intervention required, follow-up vascular surgery as outpatient  Diabetes mellitus type 2 -Hemoglobin A1c 8.4 on 9/5 -Continue sliding scale insulin with NovoLog  Reduced RV systolic function with elevated pulmonary artery pressure -Post catheterization VQ scan obtained, which was negative for pulmonary  embolism  Nonspecific renal lesions noted on CT scan incidentally.  Outpatient urology follow-up, to be arranged by PCP postdischarge.     Medications     amitriptyline  25 mg Oral QHS   aspirin EC  81 mg Oral Daily   atorvastatin  80 mg Oral q1800   brimonidine  1 drop Both Eyes q AM   clopidogrel  75 mg Oral Q breakfast   heparin  2,000 Units Intravenous Once   influenza vaccine adjuvanted  0.5 mL Intramuscular Tomorrow-1000   insulin aspart  0-9 Units Subcutaneous TID WC   latanoprost  2 drop Both Eyes BID   loperamide  4 mg Oral Once   metoprolol tartrate  25 mg Oral BID   sevelamer carbonate  800 mg Oral TID WC   sodium chloride flush  3 mL Intravenous Q12H     Data Reviewed:   CBG:  Recent Labs  Lab 02/22/23 1248 02/22/23 1643 02/22/23 2047 02/23/23 0742 02/23/23 1143  GLUCAP 161* 190* 250* 261* 211*    SpO2: 94 % O2 Flow Rate (L/min): 2 L/min    Vitals:   02/23/23 1800 02/23/23 1819 02/23/23 1829 02/23/23 1921  BP: (!) 147/73 (!) 145/72 (!) 129/58   Pulse: 78 81 78 95  Resp: 14 19 16 18   Temp:   99.5 F (37.5 C) 98.8 F (37.1 C)  TempSrc:    Oral  SpO2: 97% 92% 97% 94%  Weight:      Height:          Data Reviewed:  Basic Metabolic Panel: Recent Labs  Lab 02/18/23 0534 02/19/23 0651 02/20/23 0632 02/21/23 0433 02/22/23 0320 02/23/23 1445  NA 132* 134*  --  127* 129* 128*  K 5.1 4.8  --  5.9* 4.6 4.7  CL 89* 91*  --  86* 90* 90*  CO2 25 27  --  21* 24 21*  GLUCOSE 153* 131*  --  215* 173* 71  BUN 63* 36*  --  75* 45* 68*  CREATININE 7.78* 5.82*  --  9.34* 6.72* 9.13*  CALCIUM 8.8* 9.2  --  9.4 8.6* 8.3*  MG  --  2.1 2.4 2.4 2.2  --   PHOS 7.7* 4.9*  --  8.5*  --  8.3*    CBC: Recent Labs  Lab 02/19/23 0651 02/20/23 0632 02/21/23 0433 02/22/23 0320 02/23/23 0340  WBC 11.7* 19.5* 18.0* 15.2* 18.0*  NEUTROABS 10.0* 17.3* 16.2* 13.3* 15.9*  HGB 13.4 11.7* 11.7* 10.7* 11.7*  HCT 43.1 38.9* 36.4* 34.1* 37.6*  MCV 93.1 97.5  93.1 91.2 95.9  PLT 299 303 288 272 299    LFT Recent Labs  Lab 02/18/23 0534 02/19/23 0651 02/21/23 0433 02/23/23 1445  ALBUMIN 2.3* 2.5* 2.3* 2.0*     Antibiotics: Anti-infectives (From admission, onward)    Start     Dose/Rate Route Frequency Ordered Stop   02/23/23 1600  vancomycin (VANCOCIN) IVPB 1000 mg/200 mL premix        1,000 mg 200 mL/hr over 60 Minutes Intravenous Every M-W-F (Hemodialysis) 02/21/23 1439     02/21/23 1445  vancomycin (VANCOREADY) IVPB 1500 mg/300 mL        1,500 mg 150 mL/hr over 120 Minutes Intravenous  Once 02/21/23 1439 02/21/23 1715   02/20/23 1300  piperacillin-tazobactam (ZOSYN) IVPB 2.25 g       Note to Pharmacy: Zosyn 3.375 g IV q12h for CrCl < 20 mL/min   2.25 g 100 mL/hr over 30 Minutes Intravenous Every 8 hours 02/20/23 0840     02/20/23 0600  piperacillin-tazobactam (ZOSYN) IVPB 3.375 g  Status:  Discontinued       Note to Pharmacy: Zosyn 3.375 g IV q12h for CrCl < 20 mL/min   3.375 g 12.5 mL/hr over 240 Minutes Intravenous Every 12 hours 02/20/23 0448 02/20/23 0840   02/20/23 0600  doxycycline (VIBRAMYCIN) 100 mg in sodium chloride 0.9 % 250 mL IVPB  Status:  Discontinued        100 mg 125 mL/hr over 120 Minutes Intravenous 2 times daily 02/20/23 0518 02/20/23 0953   02/19/23 1630  vancomycin (VANCOCIN) IVPB 1000 mg/200 mL premix  Status:  Discontinued        1,000 mg 200 mL/hr over 60 Minutes Intravenous  Once 02/19/23 1538 02/21/23 1439   02/18/23 1600  vancomycin (VANCOCIN) IVPB 1000 mg/200 mL premix  Status:  Discontinued        1,000 mg 200 mL/hr over 60 Minutes Intravenous Every M-W-F (Hemodialysis) 02/17/23 1545 02/21/23 1439   02/18/23 1500  cefTRIAXone (ROCEPHIN) 2 g in sodium chloride 0.9 % 100 mL IVPB  Status:  Discontinued        2 g 200 mL/hr over 30 Minutes Intravenous Every 24 hours 02/17/23 1943 02/20/23 0446   02/17/23 1445  cefTRIAXone (ROCEPHIN) 2 g in sodium chloride 0.9 % 100 mL IVPB        2 g 200 mL/hr over  30 Minutes Intravenous  Once 02/17/23 1443 02/17/23 1540   02/17/23 1245  vancomycin (VANCOCIN) IVPB 1000 mg/200 mL premix  Status:  Discontinued        1,000 mg 200 mL/hr over 60 Minutes Intravenous  Once 02/17/23 1232 02/17/23 1239   02/17/23 1245  vancomycin (VANCOREADY) IVPB 1750 mg/350 mL        1,750  mg 175 mL/hr over 120 Minutes Intravenous  Once 02/17/23 1240 02/17/23 1526        DVT prophylaxis: Heparin  Code Status: Full code  Family Communication: No family at bedside   CONSULTS nephrology, cardiology, vascular surgery   Subjective   Patient seen, denies any complaints.   Objective    Physical Examination:   General-appears in no acute distress Heart-S1-S2, regular, no murmur auscultated Lungs-clear to auscultation bilaterally, no wheezing or crackles auscultated Abdomen-soft, nontender, no organomegaly Extremities-dry gangrene from 2nd-5th toes Neuro-alert, oriented x3, no focal deficit noted  Status is: Inpatient:             Meredeth Ide   [facility] If 7PM-7AM, please contact night-coverage at [email or web address], Office  [phone]   02/23/2023, 7:23 PM  LOS: 6 days

## 2023-02-23 NOTE — Progress Notes (Signed)
Orthopedic Tech Progress Note Patient Details:  [name] 10-Aug-1950 [id number]  Ortho Devices Type of Ortho Device: Postop shoe/boot Ortho Device/Splint Interventions: Ordered   Post Interventions Patient Tolerated: Well Instructions Provided: Adjustment of device, Care of device  Tonye Pearson 02/23/2023, 2:03 PM

## 2023-02-23 NOTE — TOC Initial Note (Addendum)
Transition of Care [facility]) - Initial/Assessment Note    Patient Details  Name: [name] MRN: [id number] Date of Birth: 1950-07-11  Transition of [facility]) CM/SW Contact:    Delilah Shan, LCSWA Phone Number: 02/23/2023, 11:57 AM  Clinical Narrative:                  CSW received consult for possible SNF placement at time of discharge. CSW spoke with patient at bedside regarding PT recommendation of SNF placement at time of discharge. Patient expressed understanding of PT recommendation and is agreeable to SNF placement at time of discharge. Patient gave CSW permission to fax out initial referral for possible SNF placement.CSW discussed insurance authorization process. Patient reports he receives HD at [facility] on MWF. pt arrives at 7:20 for 7:50 am chair time. Patients passr is pending. CSW will submit clinicals to [place] must for review. CSW started insurance authorization for patient. Auth ID# [id number]. CSW will add patients facility choice to auth when decided by patient.No further questions reported at this time. CSW to continue to follow and assist with discharge planning needs.   Expected Discharge Plan: Skilled Nursing Facility Barriers to Discharge: Continued Medical Work up   Patient Goals and CMS Choice Patient states their goals for this hospitalization and ongoing recovery are:: SNF   Choice offered to / list presented to : Patient      Expected Discharge Plan and Services In-house Referral: Clinical Social Work     Living arrangements for the past 2 months: Single Family Home                                      Prior Living Arrangements/Services Living arrangements for the past 2 months: Single Family Home Lives with:: Self Patient language and need for interpreter reviewed:: Yes Do you feel safe going back to the place where you live?: No   SNF  Need for Family Participation in Patient Care: Yes (Comment) Care giver support system in place?:  Yes (comment)   Criminal Activity/Legal Involvement Pertinent to Current Situation/Hospitalization: No - Comment as needed  Activities of Daily Living Home Assistive Devices/Equipment: Cane (specify quad or straight), Walker (specify type), Shower chair with back, Blood pressure cuff, CBG Meter, Dentures (specify type), Eyeglasses, Wheelchair ADL Screening (condition at time of admission) Patient's cognitive ability adequate to safely complete daily activities?: Yes Is the patient deaf or have difficulty hearing?: No Does the patient have difficulty seeing, even when wearing glasses/contacts?: Yes (blurry) Does the patient have difficulty concentrating, remembering, or making decisions?: No Patient able to express need for assistance with ADLs?: Yes Does the patient have difficulty dressing or bathing?: No Independently performs ADLs?: Yes (appropriate for developmental age) Does the patient have difficulty walking or climbing stairs?: No Weakness of Legs: None Weakness of Arms/Hands: None  Permission Sought/Granted Permission sought to share information with : Case Manager, Family Supports, Oceanographer granted to share information with : Yes, Verbal Permission Granted  Share Information with NAME: Dejan  Permission granted to share info w AGENCY: SNF  Permission granted to share info w Relationship: son  Permission granted to share info w Contact Information: Mariana Kaufman  Emotional Assessment Appearance:: Appears stated age Attitude/Demeanor/Rapport: Gracious Affect (typically observed): Calm Orientation: : Oriented to Self, Oriented to Place, Oriented to  Time, Oriented to Situation Alcohol / Substance Use: Not Applicable Psych Involvement: No (comment)  Admission  diagnosis:  Gangrene of left foot [facility]) [I96] Patient Active Problem List   Diagnosis Date Noted   Elevated troponin 02/20/2023   Gangrene of left foot (HCC) 02/17/2023   Diabetic foot  infection (HCC) 02/17/2023   Pulmonary hypertension (HCC) 04/22/2022   Pleural effusion on right 04/21/2022   Stage 2 hypertension 02/01/2021   Liver lesions (Infection Vs Metastasis) 12/10/2020   DM (diabetes mellitus) (HCC) 12/10/2020   Hypo-osmolality and hyponatremia 11/28/2020   Unsteady gait    Ataxia 11/16/2020   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   NPDR (nonproliferative diabetic retinopathy) (HCC) 11/21/2016   Pseudophakia of both eyes 11/21/2016   History of adenomatous polyp of colon 09/14/2016   Coronary artery disease involving native coronary artery of native heart without angina pectoris 12/05/2015   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/2015   Secondary hypertension, unspecified    Acute diastolic heart failure (HCC)    Volume overload 03/31/2015   NSTEMI (non-ST elevated myocardial infarction) (HCC) 03/31/2015   Hypoxia 03/21/2015   Fluid overload, unspecified 08/21/2014   End stage renal disease (HCC) 03/07/2014   Preoperative cardiovascular examination 02/08/2014   Shortness of breath 02/05/2014   ESRD (end stage renal disease) (HCC) 02/05/2014   Aftercare including intermittent dialysis (HCC) 02/05/2014   Coagulation defect, unspecified (HCC) 02/05/2014   Disorder of phosphorus metabolism, unspecified 02/05/2014   Hypoglycemia, unspecified 02/05/2014   Iron deficiency anemia, unspecified 02/05/2014   Moderate protein-calorie malnutrition (HCC) 02/05/2014   Other disorders of plasma-protein metabolism, not elsewhere classified 02/05/2014   Pain, unspecified 02/05/2014   Pruritus, unspecified 02/05/2014   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   Thrombocytopenia, unspecified (HCC) 02/05/2014   Chronic kidney disease 01/29/2014   Acute on chronic renal failure (HCC) 01/14/2014   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   Hyponatremia 01/14/2014   Hyperkalemia 01/14/2014   Type 2 diabetes mellitus without complications (HCC) 09/02/2009   HLD  (hyperlipidemia) 09/02/2009   Essential (primary) hypertension 09/02/2009   Coronary atherosclerosis 09/02/2009   PCP:  [facility] Pharmacy:   [facility] [address], [place] - [address] [place] [address] [place] [address] [place] [address] Phone: [phone] Fax: [phone]     Social Determinants of Health (SDOH) Social History: SDOH Screenings   Food Insecurity: No Food Insecurity (02/17/2023)  Housing: Low Risk  (02/17/2023)  Transportation Needs: No Transportation Needs (02/17/2023)  Utilities: Not At Risk (02/17/2023)  Tobacco Use: Medium Risk (02/17/2023)   SDOH Interventions:     Readmission Risk Interventions     No data to display

## 2023-02-23 NOTE — Progress Notes (Addendum)
Notified by Dr. Sharl Ma that hsTroponin came back 6,707, ordered by Dr. Eden Emms today. Patient seen in HD, hemodynamically stable. He is not having any chest pain or SOB. He denies any acute complaints. EKG has been ordered to trend. This will likely be challenging to interpret given his underlying conduction disease, will need to follow for symptoms. Discussed with Dr. Excell Seltzer who recommends that in absence of chest pain, do not need to acutely intervene but he recommends to start heparin per pharmacy, trend troponin with AM labs, and await Dr. Fabio Bering review in AM to discuss strategy for whether cath would be necessary this admission. Patient is already on ASA + Plavix. He is aware to notify for any new chest pain. HD nurse also updated of plan.  Addendum: EKG reviewed, NSR, 85bpm, first degree AVB with known RBBB, no acute change from prior. Continue plan as outlined above.

## 2023-02-23 NOTE — Progress Notes (Signed)
PT Cancellation Note  Patient Details Name: [name] MRN: [id number] DOB: 1951-03-28   Cancelled Treatment:    Reason Eval/Treat Not Completed: Patient at procedure or test/unavailable (pt at HD). Will follow up as able.   Hilton Cork, PT, DPT Secure Chat Preferred  Rehab Office [phone]   Arturo Morton Brion Aliment 02/23/2023, 3:34 PM

## 2023-02-23 NOTE — Progress Notes (Addendum)
  Progress Note    02/23/2023 6:39 AM 1 Day Post-Op  Subjective:  no complaints  afebrile  Vitals:   02/23/23 0400 02/23/23 0436  BP: 112/63 115/62  Pulse:  69  Resp:  18  Temp:  98.9 F (37.2 C)  SpO2: 97% 96%    Physical Exam: General:  resting  Cardiac:  regular Lungs:  non labored Incisions:  right groin is soft without hematoma Extremities:  + left PT doppler signal; gangrenous toes unchanged from yesterday   CBC    Component Value Date/Time   WBC 18.0 (H) 02/23/2023 0340   RBC 3.92 (L) 02/23/2023 0340   HGB 11.7 (L) 02/23/2023 0340   HCT 37.6 (L) 02/23/2023 0340   PLT 299 02/23/2023 0340   MCV 95.9 02/23/2023 0340   MCH 29.8 02/23/2023 0340   MCHC 31.1 02/23/2023 0340   RDW 17.2 (H) 02/23/2023 0340   LYMPHSABS 0.6 (L) 02/23/2023 0340   MONOABS 1.2 (H) 02/23/2023 0340   EOSABS 0.1 02/23/2023 0340   BASOSABS 0.1 02/23/2023 0340    BMET    Component Value Date/Time   NA 129 (L) 02/22/2023 0320   K 4.6 02/22/2023 0320   CL 90 (L) 02/22/2023 0320   CO2 24 02/22/2023 0320   GLUCOSE 173 (H) 02/22/2023 0320   BUN 45 (H) 02/22/2023 0320   CREATININE 6.72 (H) 02/22/2023 0320   CALCIUM 8.6 (L) 02/22/2023 0320   CALCIUM 8.3 (L) 01/14/2014 1708   GFRNONAA 8 (L) 02/22/2023 0320   GFRAA 10 (L) 07/29/2018 0024    INR    Component Value Date/Time   INR 1.5 (H) 12/12/2020 0805     Intake/Output Summary (Last 24 hours) at 02/23/2023 0639 Last data filed at 02/23/2023 7829 Gross per 24 hour  Intake 220 ml  Output 0 ml  Net 220 ml      Assessment/Plan:  72 y.o. male is s/p:  Angiogram LLE via right CFA left PTA with laser atherectomy and angioplasty left SFA and popliteal artery and angioplasty left PTA and TPT 02/22/2023 by Dr. Myra Gianotti  1 Day Post-Op   -pt with left PT doppler signal -toe amputations per orthopedics -will need f/u with [facility] in 4-6 weeks with LLE arterial duplex and ABI.  Our office will arrange appt.      Doreatha Massed,  PA-C [facility] [phone] 02/23/2023 6:39 AM   I agree with the above.  I have seen and evaluated the patient.  He is postoperative day 1 from left leg revascularization.  He has a brisk monophasic posterior tibial Doppler signal.  From my perspective he has been maximally revascularized.  He will need aspirin, statin and Plavix at discharge.  I think it is reasonable to let his foot continue to demarcate.  He has seen Dr. Logan Bores with podiatry as recently as February and so he should have a outpatient appointment scheduled with them.  [name]

## 2023-02-23 NOTE — Progress Notes (Signed)
ANTICOAGULATION CONSULT NOTE - Initial Consult  Pharmacy Consult for Heparin Indication: chest pain/ACS  Allergies  Allergen Reactions   Shellfish Allergy Hives    Patient Measurements: Height: 6\' 5"  (195.6 cm) Weight: 90.3 kg (199 lb 1.2 oz) IBW/kg (Calculated) : 89.1 Heparin Dosing Weight: 86kg   Vital Signs: Temp: 99.5 F (37.5 C) (09/11 1829) Temp Source: Oral (09/11 1144) BP: 129/58 (09/11 1829) Pulse Rate: 78 (09/11 1829)  Labs: Recent Labs    02/21/23 0433 02/22/23 0320 02/23/23 0340 02/23/23 1445  HGB 11.7* 10.7* 11.7*  --   HCT 36.4* 34.1* 37.6*  --   PLT 288 272 299  --   CREATININE 9.34* 6.72*  --  9.13*  TROPONINIHS  --   --   --  6,707*    Estimated Creatinine Clearance: 9.4 mL/min (A) (by C-G formula based on SCr of 9.13 mg/dL (H)).   Medical History: Past Medical History:  Diagnosis Date   Arthritis    CAD (coronary artery disease)    STEMI with LAD stent by [facility] in 2005   Dependence on renal dialysis (HCC)    Depression    Diabetes mellitus without complication (HCC)    History of kidney stones    Hypertension    Kidney infection    MI (mitral incompetence)    Myocardial infarction (HCC)    Seasonal allergies     Medications:  Medications Prior to Admission  Medication Sig Dispense Refill Last Dose   acetaminophen (TYLENOL) 500 MG tablet Take 325 mg by mouth every 6 (six) hours as needed for mild pain or moderate pain.   02/17/2023   amitriptyline (ELAVIL) 25 MG tablet Take 25 mg by mouth at bedtime.   02/16/2023   aspirin EC 325 MG tablet Take 325mg s once daily on non dialysis days. Dialysis days are Mon, Wed, Fri   Past Week   atorvastatin (LIPITOR) 80 MG tablet Take 1 tablet (80 mg total) by mouth daily at 6 PM. 30 tablet 0 Past Week   brimonidine (ALPHAGAN) 0.2 % ophthalmic solution Place 1 drop into both eyes in the morning.   02/16/2023   hydrALAZINE (APRESOLINE) 50 MG tablet Take 1 tablet (50 mg total) by mouth 3 (three) times  daily. 90 tablet 5 Past Month   insulin glargine (LANTUS) 100 UNIT/ML injection Inject 10-15 Units into the skin at bedtime. Sliding scale over 200   02/16/2023   latanoprost (XALATAN) 0.005 % ophthalmic solution Place 2 drops into both eyes 2 (two) times daily.   02/16/2023   minoxidil (LONITEN) 10 MG tablet Take 0.5 tablets (5 mg total) by mouth at bedtime. Patient does not take as prescribed. Sometimes takes a 1/2 tablet depending on BP readings (Patient taking differently: Take 5-10 mg by mouth at bedtime. Patient does not take as prescribed. Sometimes takes a 1/2 tablet depending on BP readings) 30 tablet 5 Past Month   Multiple Vitamin (DAILY VITAMIN PO) Take 1 tablet by mouth daily as needed (supplement).   02/16/2023   Omega-3 Fatty Acids (FISH OIL) 1000 MG CAPS Take 1,000 mg by mouth daily.   02/16/2023   OVER THE COUNTER MEDICATION Take 1 tablet by mouth daily. Beet Root   02/16/2023   verapamil (CALAN-SR) 120 MG CR tablet Take 120 mg by mouth See admin instructions. Take 1 tablet in the morning on the days that you do not have Dialysis. Do Not take any on the days you have Dialysis.   02/17/2023   Scheduled:  amitriptyline  25 mg Oral QHS   aspirin EC  81 mg Oral Daily   atorvastatin  80 mg Oral q1800   brimonidine  1 drop Both Eyes q AM   clopidogrel  75 mg Oral Q breakfast   influenza vaccine adjuvanted  0.5 mL Intramuscular Tomorrow-1000   insulin aspart  0-9 Units Subcutaneous TID WC   latanoprost  2 drop Both Eyes BID   loperamide  4 mg Oral Once   metoprolol tartrate  25 mg Oral BID   sevelamer carbonate  800 mg Oral TID WC   sodium chloride flush  3 mL Intravenous Q12H   Infusions:   sodium chloride     lactated ringers     piperacillin-tazobactam (ZOSYN)  IV 2.25 g (02/23/23 0551)   vancomycin 1,000 mg (02/23/23 1708)    Assessment: Pt had increase in troponin this PM but not CP. Heparin has been ordered to trend troponin. He is an ESRD pt.   Hgb 11s, plt wnl  Goal of  Therapy:  Heparin level 0.3-0.7 units/ml Monitor platelets by anticoagulation protocol: Yes   Plan:  Dc SQ heparin Heparin 2000 units bolus x1 Heparin infusion 1300 units/hr Check 8 hr HL then daily   Ulyses Southward, PharmD, [place], AAHIVP, CPP Infectious Disease Pharmacist 02/23/2023 7:18 PM

## 2023-02-23 NOTE — [place] FL2 (Signed)
[place] MEDICAID FL2 LEVEL OF CARE FORM     IDENTIFICATION  Patient Name: Belden Castillo Birthdate: 08-28-1950 Sex: male Admission Date (Current Location): 02/17/2023  [facility] and [place] Number:  Producer, television/film/video and Address:  [facility] [place], [address], [place], [place] [address]      Provider Number: [id number]  Attending Physician Name and Address:  Meredeth Ide, MD  Relative Name and Phone Number:  [place] [phone]) [phone]    Current Level of Care: Hospital Recommended Level of Care: Skilled Nursing Facility Prior Approval Number:    Date Approved/Denied:   PASRR Number: PASRR under review  Discharge Plan: SNF    Current Diagnoses: Patient Active Problem List   Diagnosis Date Noted   Elevated troponin 02/20/2023   Gangrene of left foot (HCC) 02/17/2023   Diabetic foot infection (HCC) 02/17/2023   Pulmonary hypertension (HCC) 04/22/2022   Pleural effusion on right 04/21/2022   Stage 2 hypertension 02/01/2021   Liver lesions (Infection Vs Metastasis) 12/10/2020   DM (diabetes mellitus) (HCC) 12/10/2020   Hypo-osmolality and hyponatremia 11/28/2020   Unsteady gait    Ataxia 11/16/2020   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   NPDR (nonproliferative diabetic retinopathy) (HCC) 11/21/2016   Pseudophakia of both eyes 11/21/2016   History of adenomatous polyp of colon 09/14/2016   Coronary artery disease involving native coronary artery of native heart without angina pectoris 12/05/2015   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/2015   Secondary hypertension, unspecified    Acute diastolic heart failure (HCC)    Volume overload 03/31/2015   NSTEMI (non-ST elevated myocardial infarction) (HCC) 03/31/2015   Hypoxia 03/21/2015   Fluid overload, unspecified 08/21/2014   End stage renal disease (HCC) 03/07/2014   Preoperative cardiovascular examination 02/08/2014   Shortness of breath 02/05/2014   ESRD (end stage renal disease) (HCC) 02/05/2014   Aftercare  including intermittent dialysis (HCC) 02/05/2014   Coagulation defect, unspecified (HCC) 02/05/2014   Disorder of phosphorus metabolism, unspecified 02/05/2014   Hypoglycemia, unspecified 02/05/2014   Iron deficiency anemia, unspecified 02/05/2014   Moderate protein-calorie malnutrition (HCC) 02/05/2014   Other disorders of plasma-protein metabolism, not elsewhere classified 02/05/2014   Pain, unspecified 02/05/2014   Pruritus, unspecified 02/05/2014   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   Thrombocytopenia, unspecified (HCC) 02/05/2014   Chronic kidney disease 01/29/2014   Acute on chronic renal failure (HCC) 01/14/2014   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   Hyponatremia 01/14/2014   Hyperkalemia 01/14/2014   Type 2 diabetes mellitus without complications (HCC) 09/02/2009   HLD (hyperlipidemia) 09/02/2009   Essential (primary) hypertension 09/02/2009   Coronary atherosclerosis 09/02/2009    Orientation RESPIRATION BLADDER Height & Weight     Self, Time, Situation, Place  Normal Continent Weight: 186 lb 15.2 oz (84.8 kg) (bed) Height:  6\' 5"  (195.6 cm)  BEHAVIORAL SYMPTOMS/MOOD NEUROLOGICAL BOWEL NUTRITION STATUS      Incontinent Diet (Please see discharge summary)  AMBULATORY STATUS COMMUNICATION OF NEEDS Skin   Limited Assist Verbally Other (Comment) (Dry,Flaky,Wound/Incision LDAs)                       Personal Care Assistance Level of Assistance  Bathing, Feeding, Dressing Bathing Assistance: Limited assistance Feeding assistance: Independent Dressing Assistance: Limited assistance     Functional Limitations Info  Sight, Hearing, Speech Sight Info: Impaired (Eyeglasses) Hearing Info: Adequate Speech Info: Adequate    SPECIAL CARE FACTORS FREQUENCY  PT (By licensed PT), OT (By licensed  OT)     PT Frequency: 5x min weekly OT Frequency: 5x min weekly            Contractures Contractures Info: Not present     Additional Factors Info  Code Status, Allergies, Insulin Sliding Scale Code Status Info: FULL Allergies Info: Shellfish Allergy   Insulin Sliding Scale Info: insulin aspart (novoLOG) injection 0-9 Units 3 times daily with meals       Current Medications (02/23/2023):  This is the current hospital active medication list Current Facility-Administered Medications  Medication Dose Route Frequency Provider Last Rate Last Admin   0.9 %  sodium chloride infusion  250 mL Intravenous PRN Nada Libman, MD       acetaminophen (TYLENOL) tablet 650 mg  650 mg Oral Q4H PRN Nada Libman, MD   650 mg at 02/22/23 1640   amitriptyline (ELAVIL) tablet 25 mg  25 mg Oral Maura Crandall, MD   25 mg at 02/22/23 2156   aspirin EC tablet 81 mg  81 mg Oral Daily Nada Libman, MD   81 mg at 02/23/23 0807   atorvastatin (LIPITOR) tablet 80 mg  80 mg Oral q1800 Tat, Onalee Hua, MD   80 mg at 02/22/23 1747   brimonidine (ALPHAGAN) 0.2 % ophthalmic solution 1 drop  1 drop Both Eyes q AM Tat, David, MD   1 drop at 02/22/23 9528   clopidogrel (PLAVIX) tablet 75 mg  75 mg Oral Q breakfast Nada Libman, MD   75 mg at 02/23/23 4132   heparin injection 5,000 Units  5,000 Units Subcutaneous Andres Labrum, MD   5,000 Units at 02/23/23 0550   hydrALAZINE (APRESOLINE) injection 10 mg  10 mg Intravenous Q6H PRN Leroy Sea, MD       hydrALAZINE (APRESOLINE) injection 5 mg  5 mg Intravenous Q20 Min PRN Nada Libman, MD       influenza vaccine adjuvanted (FLUAD) injection 0.5 mL  0.5 mL Intramuscular Tomorrow-1000 Tat, David, MD       insulin aspart (novoLOG) injection 0-9 Units  0-9 Units Subcutaneous TID WC Maretta Bees, MD   3 Units at 02/23/23 1154   labetalol (NORMODYNE) injection 10 mg  10 mg Intravenous Q10 min PRN Nada Libman, MD       lactated ringers bolus 250 mL  250 mL Intravenous Once Leroy Sea, MD       latanoprost (XALATAN) 0.005 % ophthalmic solution 2 drop  2 drop Both  Eyes BID Tat, Onalee Hua, MD   2 drop at 02/23/23 0809   loperamide (IMODIUM) capsule 4 mg  4 mg Oral Once Leroy Sea, MD       melatonin tablet 5 mg  5 mg Oral QHS PRN Joneen Roach, Debby, MD   5 mg at 02/22/23 2200   metoprolol tartrate (LOPRESSOR) tablet 25 mg  25 mg Oral BID Leroy Sea, MD   25 mg at 02/23/23 0807   morphine (PF) 2 MG/ML injection 2 mg  2 mg Intravenous Q1H PRN Nada Libman, MD       ondansetron [facility]) injection 4 mg  4 mg Intravenous Q6H PRN Nada Libman, MD       ondansetron [facility]) tablet 4 mg  4 mg Oral Q6H PRN Tat, David, MD       oxyCODONE (Oxy IR/ROXICODONE) immediate release tablet 5-10 mg  5-10 mg Oral Q4H PRN Nada Libman, MD   10 mg at 02/22/23 2338  pentafluoroprop-tetrafluoroeth (GEBAUERS) aerosol 1 Application  1 Application Topical PRN Delano Metz, MD       piperacillin-tazobactam (ZOSYN) IVPB 2.25 g  2.25 g Intravenous Q8H Leroy Sea, MD 100 mL/hr at 02/23/23 0551 2.25 g at 02/23/23 0551   prochlorperazine (COMPAZINE) injection 10 mg  10 mg Intravenous Q4H PRN Crosley, Debby, MD   10 mg at 02/22/23 2338   sevelamer carbonate (RENVELA) tablet 800 mg  800 mg Oral TID WC Delano Metz, MD   800 mg at 02/23/23 1154   sodium chloride flush (NS) 0.9 % injection 3 mL  3 mL Intravenous Q12H Nada Libman, MD   3 mL at 02/23/23 0809   sodium chloride flush (NS) 0.9 % injection 3 mL  3 mL Intravenous PRN Nada Libman, MD       vancomycin (VANCOCIN) IVPB 1000 mg/200 mL premix  1,000 mg Intravenous Q M,W,F-HD Leroy Sea, MD         Discharge Medications: Please see discharge summary for a list of discharge medications.  Relevant Imaging Results:  Relevant Lab Results:   Additional Information [id number],[facility] on MWF. pt arrives at 7:20 for 7:50 am chair time.  Delilah Shan, LCSWA

## 2023-02-23 NOTE — Inpatient Diabetes Management (Addendum)
Inpatient Diabetes Program Recommendations  AACE/ADA: New Consensus Statement on Inpatient Glycemic Control (2015)  Target Ranges:  Prepandial:   less than 140 mg/dL      Peak postprandial:   less than 180 mg/dL (1-2 hours)      Critically ill patients:  140 - 180 mg/dL   Lab Results  Component Value Date   GLUCAP 261 (H) 02/23/2023   HGBA1C 8.4 (H) 02/17/2023    Review of Glycemic Control  Latest Reference Range & Units 02/21/23 20:18 02/22/23 12:48 02/22/23 16:43 02/22/23 20:47 02/23/23 07:42  Glucose-Capillary 70 - 99 mg/dL 409 (H) 811 (H) 914 (H) 250 (H) 261 (H)   Diabetes history: DM 2 Outpatient Diabetes medications:  Lantus 10-15 units q HS  Current orders for Inpatient glycemic control:  Novolog 0-9 units tid with meals  Inpatient Diabetes Program Recommendations:   Please consider adding Semglee 10 units daily.   Thanks,  Beryl Meager, RN, BC-ADM Inpatient Diabetes Coordinator Pager [phone]  (8a-5p)

## 2023-02-23 NOTE — Progress Notes (Signed)
Subjective:   No cardiac complaints   Objective:  Vitals:   02/23/23 0300 02/23/23 0400 02/23/23 0436 02/23/23 0743  BP: 107/60 112/63 115/62 (!) 141/75  Pulse:   69 82  Resp:   18 20  Temp:   98.9 F (37.2 C) 98.1 F (36.7 C)  TempSrc:   Oral Oral  SpO2:  97% 96% 97%  Weight:      Height:        Intake/Output from previous day:  Intake/Output Summary (Last 24 hours) at 02/23/2023 1001 Last data filed at 02/23/2023 1610 Gross per 24 hour  Intake 580 ml  Output 0 ml  Net 580 ml    Physical Exam:  Left carotid bruit SEM  murmur Lungs clear  Decreased peripheral pulses with gangrene dry Left 205 toes Abdomen benign  No edema  Lab Results: Basic Metabolic Panel: Recent Labs    02/21/23 0433 02/22/23 0320  NA 127* 129*  K 5.9* 4.6  CL 86* 90*  CO2 21* 24  GLUCOSE 215* 173*  BUN 75* 45*  CREATININE 9.34* 6.72*  CALCIUM 9.4 8.6*  MG 2.4 2.2  PHOS 8.5*  --    Liver Function Tests: Recent Labs    02/21/23 0433  ALBUMIN 2.3*   No results for input(s): "LIPASE", "AMYLASE" in the last 72 hours. CBC: Recent Labs    02/22/23 0320 02/23/23 0340  WBC 15.2* 18.0*  NEUTROABS 13.3* 15.9*  HGB 10.7* 11.7*  HCT 34.1* 37.6*  MCV 91.2 95.9  PLT 272 299    Imaging: DG Chest Port 1V same Day  Result Date: 02/23/2023 CLINICAL DATA:  Dyspnea EXAM: PORTABLE CHEST 1 VIEW COMPARISON:  Yesterday FINDINGS: Low volume chest with streaky density at the bases, asymmetric to the right where there is volume loss and small pleural effusion. No change detected since recent CT. Cardiomegaly. CABG. IMPRESSION: 1. Unchanged atelectasis/scarring with small right pleural effusion. 2. Cardiomegaly without edema. Electronically Signed   By: Tiburcio Pea M.D.   On: 02/23/2023 09:21   VAS US CAROTID  Result Date: 02/22/2023 Carotid Arterial Duplex Study Patient Name:  [name]  Date of Exam:   02/22/2023 Medical Rec #: [id number]        Accession #:    [id number] Date  of Birth: [birth year]        Patient Gender: M Patient Age:   72 years Exam Location:  [facility] Procedure:      VAS US CAROTID Referring Phys: Charlton Haws --------------------------------------------------------------------------------  Indications:      Bilateral bruits. Risk Factors:     Hypertension, hyperlipidemia, prior MI, coronary artery                   disease. Other Factors:    ESRD (HD), HX CABG. Comparison Study: Previous exam 04/02/2015 - bilateral 1-39% Performing Technologist: Jody Hill RVT, RDMS  Examination Guidelines: A complete evaluation includes B-mode imaging, spectral Doppler, color Doppler, and power Doppler as needed of all accessible portions of each vessel. Bilateral testing is considered an integral part of a complete examination. Limited examinations for reoccurring indications may be performed as noted.  Right Carotid Findings: +----------+--------+--------+--------+------------------+------------------+           PSV cm/sEDV cm/sStenosisPlaque DescriptionComments           +----------+--------+--------+--------+------------------+------------------+ CCA Prox  83      8  intimal thickening +----------+--------+--------+--------+------------------+------------------+ CCA Distal76      6                                 intimal thickening +----------+--------+--------+--------+------------------+------------------+ ICA Prox  130     22      40-59%  calcific                             +----------+--------+--------+--------+------------------+------------------+ ICA Mid   53      12                                                   +----------+--------+--------+--------+------------------+------------------+ ICA Distal60      15                                                   +----------+--------+--------+--------+------------------+------------------+ ECA       120     0               calcific                              +----------+--------+--------+--------+------------------+------------------+ +----------+--------+-------+----------------+-------------------+           PSV cm/sEDV cmsDescribe        Arm Pressure (mmHG) +----------+--------+-------+----------------+-------------------+ [id number]             Multiphasic, WNL                    +----------+--------+-------+----------------+-------------------+ +---------+--------+--+--------+--+---------+ VertebralPSV cm/s46EDV cm/s10Antegrade +---------+--------+--+--------+--+---------+  Left Carotid Findings: +----------+-------+--------+--------+-----------------------+-----------------+           PSV    EDV cm/sStenosisPlaque Description     Comments                    cm/s                                                            +----------+-------+--------+--------+-----------------------+-----------------+ CCA Prox  95     8               heterogenous           intimal                                                                   thickening        +----------+-------+--------+--------+-----------------------+-----------------+ CCA Distal87     13              heterogenous and       intimal  calcific               thickening        +----------+-------+--------+--------+-----------------------+-----------------+ ICA Prox  104    21      1-39%   heterogenous                             +----------+-------+--------+--------+-----------------------+-----------------+ ICA Mid   114    32                                                       +----------+-------+--------+--------+-----------------------+-----------------+ ICA Distal95     24                                                       +----------+-------+--------+--------+-----------------------+-----------------+ ECA       99     0               calcific                                  +----------+-------+--------+--------+-----------------------+-----------------+ +----------+--------+--------+--------+-------------------+           PSV cm/sEDV cm/sDescribeArm Pressure (mmHG) +----------+--------+--------+--------+-------------------+ Subclavian307     60      Stenotic                    +----------+--------+--------+--------+-------------------+ +---------+--------+--+--------+-+---------+ VertebralPSV cm/s36EDV cm/s6Antegrade +---------+--------+--+--------+-+---------+   Summary: Right Carotid: Velocities in the right ICA are consistent with a 40-59% (lower                end of range) stenosis. Left Carotid: Velocities in the left ICA are consistent with a 1-39% stenosis. Vertebrals:  Bilateral vertebral arteries demonstrate antegrade flow. Subclavians: Left subclavian artery was stenotic. Normal flow hemodynamics were              seen in the right subclavian artery. *See table(s) above for measurements and observations.     Preliminary    PERIPHERAL VASCULAR CATHETERIZATION  Result Date: 02/22/2023 Images from the original result were not included. Patient name: [name] MRN: [id number] DOB: 24-Oct-1950 Sex: male 02/17/2023 - 02/22/2023 Pre-operative Diagnosis: Left foot ulcer Post-operative diagnosis:  Same Surgeon:  [name] Procedure Performed:  1.  Ultrasound-guided access, right femoral artery  2.  Ultrasound-guided access, left posterior tibial artery  3.  Abdominal aortogram  4.  Left leg angiogram  5.  Selective images with the catheter in the left superficial femoral artery from the right femoral artery  6.  Selective angiogram with catheter in the posterior tibial artery  7.  Laser atherectomy and angioplasty left superficial femoral and popliteal artery  8.  Angioplasty, left posterior tibial artery and tibioperoneal trunk  9.  Closure device, Mynx  10. Conscious sedation, 219 munutes Indications: This is a 72 year old  gentleman with end-stage renal disease who has a nonhealing left foot wound and severe arterial compromise.  He comes in today for further evaluation of possible intervention Procedure:  The patient was identified in the holding area and taken to room 8.  The  patient was then placed supine on the table and prepped and draped in the usual sterile fashion.  A time out was called.  Conscious sedation was administered with the use of IV fentanyl and Versed under continuous physician and nurse monitoring.  Heart rate, blood pressure, and oxygen saturation were continuously monitored.  Total sedation time was .  Ultrasound was used to evaluate the right common femoral artery.  It was patent .  A digital ultrasound image was acquired.  A micropuncture needle was used to access the right common femoral artery under ultrasound guidance.  An 018 wire was advanced without resistance and a micropuncture sheath was placed.  The 018 wire was removed and a benson wire was placed.  The micropuncture sheath was exchanged for a 5 french sheath.  An omniflush catheter was advanced over the wire to the level of L-1.  An abdominal angiogram was obtained.  Next, using the omniflush catheter and a benson wire, the aortic bifurcation was crossed and the catheter was placed into theleft external iliac artery and left runoff was obtained. Findings:  Aortogram: No significant renal artery stenosis was visualized.  The infrarenal abdominal aorta is calcified but patent without significant stenosis.  Bilateral common and external iliac arteries are widely patent.  Bilateral common femoral arteries are calcified but patent without significant stenosis.  Right Lower Extremity: Not evaluated.  Left Lower Extremity: The left common femoral and profundofemoral artery are widely patent.  The superficial femoral artery is of relatively minimal disease down to just above the adductor canal where it becomes heavily calcified with multiple  areas of greater than 90% stenosis that persist through the popliteal artery above the knee and across the joint space.  The below-knee popliteal artery has luminal irregularities.  It is fairly small in caliber.  The tibioperoneal trunk is patent with moderate calcific disease.  The posterior tibial is the dominant runoff to the peroneal and anterior tibial artery are occluded. Intervention: After the above images were acquired the decision was made to proceed with intervention.  A 7 French 45 cm sheath was advanced into the left superficial femoral artery.  The patient was fully heparinized.  Additional injections were performed with the catheter in the superficial femoral artery to better define the anatomy.  I tried to use a 035 Glidewire and quick cross to cross the stenosis.  Because of the heavily calcified lesion, I was unable to advance the catheter past the bone edge.  I did not think this was going to be feasible and so I elected to get pedal access. The left posterior tibial artery was evaluated with ultrasound.  He had mild calcific changes.  It was cannulated under ultrasound guidance with a micropuncture needle.  A 018 wire was inserted followed placement of a pedal access sheath.  Next using a V-18 wire and had a CSI catheter, with significant difficulty I was able to get access into the superficial femoral artery.  I then swapped out for a [place] core 014 wire and snared it with a 10 mm snare and brought it out through the sheath in the right groin establishing through and through access.  I then tried to perform laser atherectomy.  I was unable to advance the laser catheter past the first calcific island.  I ended up having to change out 014 wires multiple times, having to snare it each time.  I did not feel that because of the calcific disease that I was going to be able to  get the laser all the way across and so I ended up predilated this with a 2 mm balloon.  I was unable to advance the 015  laser to treat the above-knee popliteal and superficial femoral artery.  I then switched out to a 035 wire, continuing with through and through access.  I performed balloon angioplasty of the above-knee popliteal, superficial femoral, and popliteal artery with a 4 mm balloon.  I then inserted a 3 mm balloon to treat the tibioperoneal trunk and posterior tibial artery.  Upon attempting to remove the balloon, the balloon snapped off from the elbow.  I was unable to save wire access and safely remove the balloon and so I gave up wire access.  At this point I performed completion imaging.  I had inline flow through the superficial femoral-popliteal artery with minimal residual stenosis, less than 20%.  The tibioperoneal trunk was widely patent as well as the posterior tibial artery.  I felt that this was sufficient for now, given the complexity and difficulty treating the lesions.  I closed the right groin with a Mynx and manual compression was held behind the ankle for the posterior tibial closure Impression:  #1  Heavily calcified left superficial femoral and popliteal artery with multiple areas of high-grade stenosis.  I had significant difficulty crossing these lesions even with through and through access via the posterior tibial artery.  Ultimately I was able to treat the superficial femoral and above-knee popliteal artery with a 1.5 laser catheter.  I have ballooned the superficial femoral and popliteal artery with a 4 mm balloon and the tibioperoneal trunk and posterior tibial with a 3 mm balloon.  The patient now has inline flow via the posterior tibial artery down to the foot  #2  Diffuse disease out onto the foot.  #3  Patient has been maximally revascularized for now.  He may require additional interventions in the future for recurrent stenosis, given that he was unable to be stented today V. [name], M.D., FACS [facility] Office: [phone] Pager:  [phone]   DG  Chest Port 1 View  Result Date: 02/22/2023 CLINICAL DATA:  Shortness of breath EXAM: PORTABLE CHEST 1 VIEW COMPARISON:  CXR 02/20/23 FINDINGS: Status post median sternotomy and CABG. Cardiomegaly. Moderate right-sided pleural effusion, unchanged. No pneumothorax. Hazy opacity in the right base could represent atelectasis. No radiographically apparent displaced rib fractures. Visualized upper abdomen is unremarkable. IMPRESSION: 1. Moderate right-sided pleural effusion, unchanged. 2. Hazy opacity in the right base most likely represents atelectasis. Electronically Signed   By: Lorenza Cambridge M.D.   On: 02/22/2023 08:11   ECHOCARDIOGRAM COMPLETE  Result Date: 02/21/2023    ECHOCARDIOGRAM REPORT   Patient Name:   [name] Date of Exam: 02/21/2023 Medical Rec #:  [id number]       Height:       77.0 in Accession #:    [id number]      Weight:       180.1 lb Date of Birth:  [birth year]       BSA:          2.139 m Patient Age:    72 years        BP:           102/68 mmHg Patient Gender: M               HR:           80 bpm. Exam Location:  Inpatient Procedure:  2D Echo, Cardiac Doppler and Color Doppler Indications:    Chest Pain R07.9  History:        Patient has prior history of Echocardiogram examinations, most                 recent 04/08/2022. Previous Myocardial Infarction, Prior CABG,                 Mitral Valve Disease, Signs/Symptoms:Chest Pain; Risk                 Factors:Hypertension, Diabetes, Dyslipidemia and Non-Smoker.  Sonographer:    Aron Baba Referring Phys: Heide Scales [facility]  Sonographer Comments: Suboptimal apical window. Image acquisition challenging due to respiratory motion. IMPRESSIONS  1. Left ventricular ejection fraction, by estimation, is 60 to 65%. The left ventricle has normal function. The left ventricle has no regional wall motion abnormalities. There is mild left ventricular hypertrophy. Left ventricular diastolic parameters are consistent with Grade II diastolic dysfunction  (pseudonormalization). There is the interventricular septum is flattened in diastole ('D' shaped left ventricle), consistent with right ventricular volume overload.  2. Right ventricular systolic function is severely reduced. The right ventricular size is severely enlarged. There is moderately elevated pulmonary artery systolic pressure. The estimated right ventricular systolic pressure is 53.2 mmHg.  3. No evidence of mitral valve regurgitation.  4. Tricuspid valve regurgitation is moderate.  5. There is moderate calcification of the aortic valve. Aortic valve regurgitation is not visualized. Mild to moderate aortic valve stenosis.  6. The inferior vena cava is dilated in size with <50% respiratory variability, suggesting right atrial pressure of 15 mmHg. Conclusion(s)/Recommendation(s): Compared to prior study, there's increased pulmonary htn with RV dysfunction can consider CT PE. FINDINGS  Left Ventricle: Left ventricular ejection fraction, by estimation, is 60 to 65%. The left ventricle has normal function. The left ventricle has no regional wall motion abnormalities. The left ventricular internal cavity size was normal in size. There is  mild left ventricular hypertrophy. The interventricular septum is flattened in diastole ('D' shaped left ventricle), consistent with right ventricular volume overload. Left ventricular diastolic parameters are consistent with Grade II diastolic dysfunction (pseudonormalization). Right Ventricle: The right ventricular size is severely enlarged. Right ventricular systolic function is severely reduced. There is moderately elevated pulmonary artery systolic pressure. The tricuspid regurgitant velocity is 3.09 m/s, and with an assumed right atrial pressure of 15 mmHg, the estimated right ventricular systolic pressure is 53.2 mmHg. Left Atrium: Left atrial size was normal in size. Right Atrium: Right atrial size was normal in size. Pericardium: There is no evidence of pericardial  effusion. Mitral Valve: No evidence of mitral valve regurgitation. Tricuspid Valve: The tricuspid valve is normal in structure. Tricuspid valve regurgitation is moderate. Aortic Valve: There is moderate calcification of the aortic valve. Aortic valve regurgitation is not visualized. Mild to moderate aortic stenosis is present. Aortic valve mean gradient measures 20.4 mmHg. Aortic valve peak gradient measures 36.8 mmHg. Aortic valve area, by VTI measures 1.27 cm. Pulmonic Valve: The pulmonic valve was normal in structure. Pulmonic valve regurgitation is not visualized. Aorta: The aortic root and ascending aorta are structurally normal, with no evidence of dilitation. Venous: The inferior vena cava is dilated in size with less than 50% respiratory variability, suggesting right atrial pressure of 15 mmHg. IAS/Shunts: No atrial level shunt detected by color flow Doppler.  LEFT VENTRICLE PLAX 2D LVIDd:         4.40 cm   Diastology LVIDs:  3.20 cm   LV e' medial:    5.44 cm/s LV PW:         0.90 cm   LV E/e' medial:  22.4 LV IVS:        1.00 cm   LV e' lateral:   5.98 cm/s LVOT diam:     2.00 cm   LV E/e' lateral: 20.4 LV SV:         57 LV SV Index:   27 LVOT Area:     3.14 cm  RIGHT VENTRICLE RV S prime:     3.59 cm/s TAPSE (M-mode): 0.9 cm LEFT ATRIUM             Index        RIGHT ATRIUM           Index LA diam:        4.20 cm 1.96 cm/m   RA Area:     21.60 cm LA Vol (A2C):   50.5 ml 23.60 ml/m  RA Volume:   59.70 ml  27.90 ml/m LA Vol (A4C):   44.9 ml 20.99 ml/m LA Biplane Vol: 48.7 ml 22.76 ml/m  AORTIC VALVE                     PULMONIC VALVE AV Area (Vmax):    0.96 cm      PR End Diast Vel: 4.24 msec AV Area (Vmean):   1.02 cm AV Area (VTI):     1.27 cm AV Vmax:           303.40 cm/s AV Vmean:          206.800 cm/s AV VTI:            0.450 m AV Peak Grad:      36.8 mmHg AV Mean Grad:      20.4 mmHg LVOT Vmax:         92.50 cm/s LVOT Vmean:        67.100 cm/s LVOT VTI:          0.182 m LVOT/AV VTI  ratio: 0.40  AORTA Ao Root diam: 3.70 cm Ao Asc diam:  3.60 cm MITRAL VALVE                TRICUSPID VALVE MV Area (PHT): 4.06 cm     TR Peak grad:   38.2 mmHg MV Decel Time: 187 msec     TR Vmax:        309.00 cm/s MR Peak grad: 3.4 mmHg MR Vmax:      92.00 cm/s    SHUNTS MV E velocity: 122.00 cm/s  Systemic VTI:  0.18 m MV A velocity: 108.00 cm/s  Systemic Diam: 2.00 cm MV E/A ratio:  1.13 Photographer signed by Carolan Clines Signature Date/Time: 02/21/2023/11:29:44 AM    Final     Cardiac Studies:  ECG: SR RBBB chronic no acute changes    Telemetry:  NSR  Echo: 04/08/22 EF 60-65% 02/21/23 EF 60-65% RVE mild/mod AS   Medications:    amitriptyline  25 mg Oral QHS   aspirin EC  81 mg Oral Daily   atorvastatin  80 mg Oral q1800   brimonidine  1 drop Both Eyes q AM   clopidogrel  75 mg Oral Q breakfast   heparin  5,000 Units Subcutaneous Q8H   influenza vaccine adjuvanted  0.5 mL Intramuscular Tomorrow-1000   insulin aspart  0-9 Units Subcutaneous TID WC   latanoprost  2 drop Both Eyes BID   loperamide  4 mg Oral Once   metoprolol tartrate  25 mg Oral BID   sevelamer carbonate  800 mg Oral TID WC   sodium chloride flush  3 mL Intravenous Q12H      sodium chloride     lactated ringers     piperacillin-tazobactam (ZOSYN)  IV 2.25 g (02/23/23 0551)   vancomycin      Assessment/Plan:   Elevated Troponin:  need to repeat initial levels 122->245 then jumped to 1813 no chest pain or acute ECG changes. TTE with no RWMA and normal EF 60-65%   Continue Plavix/ASA/statin and [place] heparin. Distant history of CABG 2016 given gangrene of LEls and lack of symptoms would proceed with LE angiogram in am. No benefit to delaying any revascularization or amputation to avoid sepsis . Would check D dimer and do CTA V/Q  to r/o pulmonary embolus as alternative cause of elevated troponin given RVE and elevated PA pressures on TTE Carotid Bruit: 40-59% stenosis in Right ICA  Gangrene:  Continue Zosyn  coverage Post difficult laser /PCI of SFA and popliteal by Dr Myra Gianotti 02/22/23 Revascularization as good as it can be now Not clear if he will need amputation at this point   Per primary service order CTA V/Q  r/o PE    Charlton Haws 02/23/2023, 10:01 AM

## 2023-02-23 NOTE — Procedures (Signed)
HD Note:  Some information was entered later than the data was gathered due to patient care needs. The stated time with the data is accurate.  Received patient in bed to unit.   Alert and oriented to person and situation  Informed consent signed and in chart.   Access used: Upper left arm fistula Access issues: None Patient had critical lab that was called to Dr. Marisue Humble when received. Patient tolerated treatment well. At the end of treatment, the patient complained of pain in his left foot. Post treatment vitals included a temperature of 99.5.  Meds given for both, see MAR.  TX duration: 3.5 hours  Alert, without acute distress.  Total UF removed: 2500 ml  Hand-off given to patient's nurse.   Transported back to the room   Darby Fleeman L. Dareen Piano, RN [facility].

## 2023-02-23 NOTE — Progress Notes (Signed)
[facility] Progress Note   Subjective:   Pt reports feeling well today, denies SOB, CP, dizziness, nausea, foot pain. Going to HD shortly.   Objective Vitals:   02/23/23 0300 02/23/23 0400 02/23/23 0436 02/23/23 0743  BP: 107/60 112/63 115/62 (!) 141/75  Pulse:   69 82  Resp:   18 20  Temp:   98.9 F (37.2 C) 98.1 F (36.7 C)  TempSrc:   Oral Oral  SpO2:  97% 96% 97%  Weight:      Height:       Physical Exam General: Alert male in NAD Heart: RRR, no murmurs, rubs or gallops Lungs: CTA bilaterally, respirations unlabored on RA Abdomen: Soft, non-distended, +BS Extremities: No edema b/l lower extremities, discoloration to R toes Dialysis Access:  LUE AVG +t/b  Additional Objective Labs: Basic Metabolic Panel: Recent Labs  Lab 02/18/23 0534 02/19/23 0651 02/21/23 0433 02/22/23 0320  NA 132* 134* 127* 129*  K 5.1 4.8 5.9* 4.6  CL 89* 91* 86* 90*  CO2 25 27 21* 24  GLUCOSE 153* 131* 215* 173*  BUN 63* 36* 75* 45*  CREATININE 7.78* 5.82* 9.34* 6.72*  CALCIUM 8.8* 9.2 9.4 8.6*  PHOS 7.7* 4.9* 8.5*  --    Liver Function Tests: Recent Labs  Lab 02/18/23 0534 02/19/23 0651 02/21/23 0433  ALBUMIN 2.3* 2.5* 2.3*   No results for input(s): "LIPASE", "AMYLASE" in the last 168 hours. CBC: Recent Labs  Lab 02/19/23 0651 02/20/23 0632 02/21/23 0433 02/22/23 0320 02/23/23 0340  WBC 11.7* 19.5* 18.0* 15.2* 18.0*  NEUTROABS 10.0* 17.3* 16.2* 13.3* 15.9*  HGB 13.4 11.7* 11.7* 10.7* 11.7*  HCT 43.1 38.9* 36.4* 34.1* 37.6*  MCV 93.1 97.5 93.1 91.2 95.9  PLT 299 303 288 272 299   Blood Culture    Component Value Date/Time   SDES BLOOD RIGHT HAND 02/20/2023 0746   SPECREQUEST  02/20/2023 0746    BOTTLES DRAWN AEROBIC ONLY Blood Culture adequate volume   CULT  02/20/2023 0746    NO GROWTH 2 DAYS Performed at [facility], [address], [place], [place] [address]    REPTSTATUS PENDING 02/20/2023 [phone]    Cardiac Enzymes: No results for  input(s): "CKTOTAL", "CKMB", "CKMBINDEX", "TROPONINI" in the last 168 hours. CBG: Recent Labs  Lab 02/21/23 2018 02/22/23 1248 02/22/23 1643 02/22/23 2047 02/23/23 0742  GLUCAP 140* 161* 190* 250* 261*   Iron Studies: No results for input(s): "IRON", "TIBC", "TRANSFERRIN", "FERRITIN" in the last 72 hours. @lablastinr3 @ Studies/Results: VAS US CAROTID  Result Date: 02/22/2023 Carotid Arterial Duplex Study Patient Name:  VINCENT BRIDWELL  Date of Exam:   02/22/2023 Medical Rec #: [id number]        Accession #:    [id number] Date of Birth: Jun 03, 1951        Patient Gender: M Patient Age:   72 years Exam Location:  [facility] Procedure:      VAS US CAROTID Referring Phys: Charlton Haws --------------------------------------------------------------------------------  Indications:      Bilateral bruits. Risk Factors:     Hypertension, hyperlipidemia, prior MI, coronary artery                   disease. Other Factors:    ESRD (HD), HX CABG. Comparison Study: Previous exam 04/02/2015 - bilateral 1-39% Performing Technologist: Jody Hill RVT, RDMS  Examination Guidelines: A complete evaluation includes B-mode imaging, spectral Doppler, color Doppler, and power Doppler as needed of all accessible portions of each vessel. Bilateral  testing is considered an integral part of a complete examination. Limited examinations for reoccurring indications may be performed as noted.  Right Carotid Findings: +----------+--------+--------+--------+------------------+------------------+           PSV cm/sEDV cm/sStenosisPlaque DescriptionComments           +----------+--------+--------+--------+------------------+------------------+ CCA Prox  83      8                                 intimal thickening +----------+--------+--------+--------+------------------+------------------+ CCA Distal76      6                                 intimal thickening  +----------+--------+--------+--------+------------------+------------------+ ICA Prox  130     22      40-59%  calcific                             +----------+--------+--------+--------+------------------+------------------+ ICA Mid   53      12                                                   +----------+--------+--------+--------+------------------+------------------+ ICA Distal60      15                                                   +----------+--------+--------+--------+------------------+------------------+ ECA       120     0               calcific                             +----------+--------+--------+--------+------------------+------------------+ +----------+--------+-------+----------------+-------------------+           PSV cm/sEDV cmsDescribe        Arm Pressure (mmHG) +----------+--------+-------+----------------+-------------------+ [id number]             Multiphasic, WNL                    +----------+--------+-------+----------------+-------------------+ +---------+--------+--+--------+--+---------+ VertebralPSV cm/s46EDV cm/s10Antegrade +---------+--------+--+--------+--+---------+  Left Carotid Findings: +----------+-------+--------+--------+-----------------------+-----------------+           PSV    EDV cm/sStenosisPlaque Description     Comments                    cm/s                                                            +----------+-------+--------+--------+-----------------------+-----------------+ CCA Prox  95     8               heterogenous           intimal  thickening        +----------+-------+--------+--------+-----------------------+-----------------+ CCA Distal87     13              heterogenous and       intimal                                            calcific               thickening         +----------+-------+--------+--------+-----------------------+-----------------+ ICA Prox  104    21      1-39%   heterogenous                             +----------+-------+--------+--------+-----------------------+-----------------+ ICA Mid   114    32                                                       +----------+-------+--------+--------+-----------------------+-----------------+ ICA Distal95     24                                                       +----------+-------+--------+--------+-----------------------+-----------------+ ECA       99     0               calcific                                 +----------+-------+--------+--------+-----------------------+-----------------+ +----------+--------+--------+--------+-------------------+           PSV cm/sEDV cm/sDescribeArm Pressure (mmHG) +----------+--------+--------+--------+-------------------+ Subclavian307     60      Stenotic                    +----------+--------+--------+--------+-------------------+ +---------+--------+--+--------+-+---------+ VertebralPSV cm/s36EDV cm/s6Antegrade +---------+--------+--+--------+-+---------+   Summary: Right Carotid: Velocities in the right ICA are consistent with a 40-59% (lower                end of range) stenosis. Left Carotid: Velocities in the left ICA are consistent with a 1-39% stenosis. Vertebrals:  Bilateral vertebral arteries demonstrate antegrade flow. Subclavians: Left subclavian artery was stenotic. Normal flow hemodynamics were              seen in the right subclavian artery. *See table(s) above for measurements and observations.     Preliminary    PERIPHERAL VASCULAR CATHETERIZATION  Result Date: 02/22/2023 Images from the original result were not included. Patient name: Stavros Barnhardt MRN: [id number] DOB: [birth year] Sex: male 02/17/2023 - 02/22/2023 Pre-operative Diagnosis: Left foot ulcer Post-operative diagnosis:  Same Surgeon:  [name]  Procedure Performed:  1.  Ultrasound-guided access, right femoral artery  2.  Ultrasound-guided access, left posterior tibial artery  3.  Abdominal aortogram  4.  Left leg angiogram  5.  Selective images with the catheter in the left superficial femoral artery from the right femoral artery  6.  Selective angiogram with catheter in the posterior tibial artery  7.  Laser  atherectomy and angioplasty left superficial femoral and popliteal artery  8.  Angioplasty, left posterior tibial artery and tibioperoneal trunk  9.  Closure device, Mynx  10. Conscious sedation, 219 munutes Indications: This is a 72 year old gentleman with end-stage renal disease who has a nonhealing left foot wound and severe arterial compromise.  He comes in today for further evaluation of possible intervention Procedure:  The patient was identified in the holding area and taken to room 8.  The patient was then placed supine on the table and prepped and draped in the usual sterile fashion.  A time out was called.  Conscious sedation was administered with the use of IV fentanyl and Versed under continuous physician and nurse monitoring.  Heart rate, blood pressure, and oxygen saturation were continuously monitored.  Total sedation time was .  Ultrasound was used to evaluate the right common femoral artery.  It was patent .  A digital ultrasound image was acquired.  A micropuncture needle was used to access the right common femoral artery under ultrasound guidance.  An 018 wire was advanced without resistance and a micropuncture sheath was placed.  The 018 wire was removed and a benson wire was placed.  The micropuncture sheath was exchanged for a 5 french sheath.  An omniflush catheter was advanced over the wire to the level of L-1.  An abdominal angiogram was obtained.  Next, using the omniflush catheter and a benson wire, the aortic bifurcation was crossed and the catheter was placed into theleft external iliac artery and left runoff was  obtained. Findings:  Aortogram: No significant renal artery stenosis was visualized.  The infrarenal abdominal aorta is calcified but patent without significant stenosis.  Bilateral common and external iliac arteries are widely patent.  Bilateral common femoral arteries are calcified but patent without significant stenosis.  Right Lower Extremity: Not evaluated.  Left Lower Extremity: The left common femoral and profundofemoral artery are widely patent.  The superficial femoral artery is of relatively minimal disease down to just above the adductor canal where it becomes heavily calcified with multiple areas of greater than 90% stenosis that persist through the popliteal artery above the knee and across the joint space.  The below-knee popliteal artery has luminal irregularities.  It is fairly small in caliber.  The tibioperoneal trunk is patent with moderate calcific disease.  The posterior tibial is the dominant runoff to the peroneal and anterior tibial artery are occluded. Intervention: After the above images were acquired the decision was made to proceed with intervention.  A 7 French 45 cm sheath was advanced into the left superficial femoral artery.  The patient was fully heparinized.  Additional injections were performed with the catheter in the superficial femoral artery to better define the anatomy.  I tried to use a 035 Glidewire and quick cross to cross the stenosis.  Because of the heavily calcified lesion, I was unable to advance the catheter past the bone edge.  I did not think this was going to be feasible and so I elected to get pedal access. The left posterior tibial artery was evaluated with ultrasound.  He had mild calcific changes.  It was cannulated under ultrasound guidance with a micropuncture needle.  A 018 wire was inserted followed placement of a pedal access sheath.  Next using a V-18 wire and had a CSI catheter, with significant difficulty I was able to get access into the superficial  femoral artery.  I then swapped out for a [place] core 014 wire and  snared it with a 10 mm snare and brought it out through the sheath in the right groin establishing through and through access.  I then tried to perform laser atherectomy.  I was unable to advance the laser catheter past the first calcific island.  I ended up having to change out 014 wires multiple times, having to snare it each time.  I did not feel that because of the calcific disease that I was going to be able to get the laser all the way across and so I ended up predilated this with a 2 mm balloon.  I was unable to advance the 015 laser to treat the above-knee popliteal and superficial femoral artery.  I then switched out to a 035 wire, continuing with through and through access.  I performed balloon angioplasty of the above-knee popliteal, superficial femoral, and popliteal artery with a 4 mm balloon.  I then inserted a 3 mm balloon to treat the tibioperoneal trunk and posterior tibial artery.  Upon attempting to remove the balloon, the balloon snapped off from the elbow.  I was unable to save wire access and safely remove the balloon and so I gave up wire access.  At this point I performed completion imaging.  I had inline flow through the superficial femoral-popliteal artery with minimal residual stenosis, less than 20%.  The tibioperoneal trunk was widely patent as well as the posterior tibial artery.  I felt that this was sufficient for now, given the complexity and difficulty treating the lesions.  I closed the right groin with a Mynx and manual compression was held behind the ankle for the posterior tibial closure Impression:  #1  Heavily calcified left superficial femoral and popliteal artery with multiple areas of high-grade stenosis.  I had significant difficulty crossing these lesions even with through and through access via the posterior tibial artery.  Ultimately I was able to treat the superficial femoral and above-knee popliteal  artery with a 1.5 laser catheter.  I have ballooned the superficial femoral and popliteal artery with a 4 mm balloon and the tibioperoneal trunk and posterior tibial with a 3 mm balloon.  The patient now has inline flow via the posterior tibial artery down to the foot  #2  Diffuse disease out onto the foot.  #3  Patient has been maximally revascularized for now.  He may require additional interventions in the future for recurrent stenosis, given that he was unable to be stented today V. [name], M.D., FACS [facility] Office: [phone] Pager:  [phone]   DG Chest Port 1 View  Result Date: 02/22/2023 CLINICAL DATA:  Shortness of breath EXAM: PORTABLE CHEST 1 VIEW COMPARISON:  CXR 02/20/23 FINDINGS: Status post median sternotomy and CABG. Cardiomegaly. Moderate right-sided pleural effusion, unchanged. No pneumothorax. Hazy opacity in the right base could represent atelectasis. No radiographically apparent displaced rib fractures. Visualized upper abdomen is unremarkable. IMPRESSION: 1. Moderate right-sided pleural effusion, unchanged. 2. Hazy opacity in the right base most likely represents atelectasis. Electronically Signed   By: Lorenza Cambridge M.D.   On: 02/22/2023 08:11   ECHOCARDIOGRAM COMPLETE  Result Date: 02/21/2023    ECHOCARDIOGRAM REPORT   Patient Name:   JAMSE SEROKA Date of Exam: 02/21/2023 Medical Rec #:  [id number]       Height:       77.0 in Accession #:    [id number]      Weight:       180.1 lb Date of Birth:  10-31-1950       BSA:          2.139 m Patient Age:    72 years        BP:           102/68 mmHg Patient Gender: M               HR:           80 bpm. Exam Location:  Inpatient Procedure: 2D Echo, Cardiac Doppler and Color Doppler Indications:    Chest Pain R07.9  History:        Patient has prior history of Echocardiogram examinations, most                 recent 04/08/2022. Previous Myocardial Infarction, Prior CABG,                 Mitral Valve  Disease, Signs/Symptoms:Chest Pain; Risk                 Factors:Hypertension, Diabetes, Dyslipidemia and Non-Smoker.  Sonographer:    Aron Baba Referring Phys: Heide Scales [facility]  Sonographer Comments: Suboptimal apical window. Image acquisition challenging due to respiratory motion. IMPRESSIONS  1. Left ventricular ejection fraction, by estimation, is 60 to 65%. The left ventricle has normal function. The left ventricle has no regional wall motion abnormalities. There is mild left ventricular hypertrophy. Left ventricular diastolic parameters are consistent with Grade II diastolic dysfunction (pseudonormalization). There is the interventricular septum is flattened in diastole ('D' shaped left ventricle), consistent with right ventricular volume overload.  2. Right ventricular systolic function is severely reduced. The right ventricular size is severely enlarged. There is moderately elevated pulmonary artery systolic pressure. The estimated right ventricular systolic pressure is 53.2 mmHg.  3. No evidence of mitral valve regurgitation.  4. Tricuspid valve regurgitation is moderate.  5. There is moderate calcification of the aortic valve. Aortic valve regurgitation is not visualized. Mild to moderate aortic valve stenosis.  6. The inferior vena cava is dilated in size with <50% respiratory variability, suggesting right atrial pressure of 15 mmHg. Conclusion(s)/Recommendation(s): Compared to prior study, there's increased pulmonary htn with RV dysfunction can consider CT PE. FINDINGS  Left Ventricle: Left ventricular ejection fraction, by estimation, is 60 to 65%. The left ventricle has normal function. The left ventricle has no regional wall motion abnormalities. The left ventricular internal cavity size was normal in size. There is  mild left ventricular hypertrophy. The interventricular septum is flattened in diastole ('D' shaped left ventricle), consistent with right ventricular volume overload. Left  ventricular diastolic parameters are consistent with Grade II diastolic dysfunction (pseudonormalization). Right Ventricle: The right ventricular size is severely enlarged. Right ventricular systolic function is severely reduced. There is moderately elevated pulmonary artery systolic pressure. The tricuspid regurgitant velocity is 3.09 m/s, and with an assumed right atrial pressure of 15 mmHg, the estimated right ventricular systolic pressure is 53.2 mmHg. Left Atrium: Left atrial size was normal in size. Right Atrium: Right atrial size was normal in size. Pericardium: There is no evidence of pericardial effusion. Mitral Valve: No evidence of mitral valve regurgitation. Tricuspid Valve: The tricuspid valve is normal in structure. Tricuspid valve regurgitation is moderate. Aortic Valve: There is moderate calcification of the aortic valve. Aortic valve regurgitation is not visualized. Mild to moderate aortic stenosis is present. Aortic valve mean gradient measures 20.4 mmHg. Aortic valve peak gradient measures 36.8 mmHg. Aortic valve area, by VTI measures 1.27 cm. Pulmonic Valve: The pulmonic  valve was normal in structure. Pulmonic valve regurgitation is not visualized. Aorta: The aortic root and ascending aorta are structurally normal, with no evidence of dilitation. Venous: The inferior vena cava is dilated in size with less than 50% respiratory variability, suggesting right atrial pressure of 15 mmHg. IAS/Shunts: No atrial level shunt detected by color flow Doppler.  LEFT VENTRICLE PLAX 2D LVIDd:         4.40 cm   Diastology LVIDs:         3.20 cm   LV e' medial:    5.44 cm/s LV PW:         0.90 cm   LV E/e' medial:  22.4 LV IVS:        1.00 cm   LV e' lateral:   5.98 cm/s LVOT diam:     2.00 cm   LV E/e' lateral: 20.4 LV SV:         57 LV SV Index:   27 LVOT Area:     3.14 cm  RIGHT VENTRICLE RV S prime:     3.59 cm/s TAPSE (M-mode): 0.9 cm LEFT ATRIUM             Index        RIGHT ATRIUM           Index LA  diam:        4.20 cm 1.96 cm/m   RA Area:     21.60 cm LA Vol (A2C):   50.5 ml 23.60 ml/m  RA Volume:   59.70 ml  27.90 ml/m LA Vol (A4C):   44.9 ml 20.99 ml/m LA Biplane Vol: 48.7 ml 22.76 ml/m  AORTIC VALVE                     PULMONIC VALVE AV Area (Vmax):    0.96 cm      PR End Diast Vel: 4.24 msec AV Area (Vmean):   1.02 cm AV Area (VTI):     1.27 cm AV Vmax:           303.40 cm/s AV Vmean:          206.800 cm/s AV VTI:            0.450 m AV Peak Grad:      36.8 mmHg AV Mean Grad:      20.4 mmHg LVOT Vmax:         92.50 cm/s LVOT Vmean:        67.100 cm/s LVOT VTI:          0.182 m LVOT/AV VTI ratio: 0.40  AORTA Ao Root diam: 3.70 cm Ao Asc diam:  3.60 cm MITRAL VALVE                TRICUSPID VALVE MV Area (PHT): 4.06 cm     TR Peak grad:   38.2 mmHg MV Decel Time: 187 msec     TR Vmax:        309.00 cm/s MR Peak grad: 3.4 mmHg MR Vmax:      92.00 cm/s    SHUNTS MV E velocity: 122.00 cm/s  Systemic VTI:  0.18 m MV A velocity: 108.00 cm/s  Systemic Diam: 2.00 cm MV E/A ratio:  1.13 Photographer signed by Carolan Clines Signature Date/Time: 02/21/2023/11:29:44 AM    Final    Medications:  sodium chloride     lactated ringers     piperacillin-tazobactam (ZOSYN)  IV 2.25 g (02/23/23 0551)  vancomycin      amitriptyline  25 mg Oral QHS   aspirin EC  81 mg Oral Daily   atorvastatin  80 mg Oral q1800   brimonidine  1 drop Both Eyes q AM   clopidogrel  75 mg Oral Q breakfast   heparin  5,000 Units Subcutaneous Q8H   influenza vaccine adjuvanted  0.5 mL Intramuscular Tomorrow-1000   insulin aspart  0-9 Units Subcutaneous TID WC   latanoprost  2 drop Both Eyes BID   loperamide  4 mg Oral Once   metoprolol tartrate  25 mg Oral BID   sevelamer carbonate  800 mg Oral TID WC   sodium chloride flush  3 mL Intravenous Q12H    Outpatient Dialysis Orders: [facility]) - MWF Time: 4h 15 min Qb: 500, Qd: 500 Bath: 2k/2Ca+ EDW: 82kg Heparin 2000u at start     Assessment/Plan: 1. 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 On ABXs; s/p angiogram on 9/10 per [facility] 2.Hypotension - Informed of hypotensive episode overnight Saturday. LR and 1x Midodrine dose were given. Hydralazine/Minoxidil were stopped. He remains on Metoprolol BID. Bps improved and patient feels better.  3. Possible PNA - Per recent CT, blood cultures drawn today, on ABXs, managed per primary 4. ESRD: On HD MWF. Due for HD today. CT chest showed numerous renal lesions BL. Per hospitalist, plan to schedule outpatient urology follow-up at discharge for further evaluation. 5. Hyperkalemia: Resolved with HD.  6. HTN/Vol - Bps stable here currently. Na low but improving. UF 2-3L tomorrow as tolerated. Per OP HD Nurse, he has a history of hypotension when nearing EDW. See #2. 7. Metabolic bone disease: Phos was 7.7 at admit so binders started. Phos 8.5, CTM for now. Calcium slightly elevated, not on VDRA 8. Anemia of CKD: Hgb at goal. Fe/ESA is not indicated at this time. 9. DM2: Managed by hospital service.  10. History CAD s/p CABG - on ASA/statin, troponins and BNP are trending up here, ECHO ordered and awaiting results, managed by primary    Rogers Blocker, PA-C 02/23/2023, 8:42 AM  [facility] Pager: [phone]

## 2023-02-24 DIAGNOSIS — Z951 Presence of aortocoronary bypass graft: Secondary | ICD-10-CM | POA: Diagnosis not present

## 2023-02-24 DIAGNOSIS — R7989 Other specified abnormal findings of blood chemistry: Secondary | ICD-10-CM | POA: Diagnosis not present

## 2023-02-24 DIAGNOSIS — E871 Hypo-osmolality and hyponatremia: Secondary | ICD-10-CM | POA: Diagnosis not present

## 2023-02-24 DIAGNOSIS — I96 Gangrene, not elsewhere classified: Secondary | ICD-10-CM | POA: Diagnosis not present

## 2023-02-24 DIAGNOSIS — E11628 Type 2 diabetes mellitus with other skin complications: Secondary | ICD-10-CM | POA: Diagnosis not present

## 2023-02-24 LAB — GLUCOSE, CAPILLARY
Glucose-Capillary: 120 mg/dL — ABNORMAL HIGH (ref 70–99)
Glucose-Capillary: 146 mg/dL — ABNORMAL HIGH (ref 70–99)
Glucose-Capillary: 202 mg/dL — ABNORMAL HIGH (ref 70–99)
Glucose-Capillary: 257 mg/dL — ABNORMAL HIGH (ref 70–99)

## 2023-02-24 LAB — BASIC METABOLIC PANEL
Anion gap: 15 (ref 5–15)
BUN: 37 mg/dL — ABNORMAL HIGH (ref 8–23)
CO2: 24 mmol/L (ref 22–32)
Calcium: 8.7 mg/dL — ABNORMAL LOW (ref 8.9–10.3)
Chloride: 91 mmol/L — ABNORMAL LOW (ref 98–111)
Creatinine, Ser: 6.39 mg/dL — ABNORMAL HIGH (ref 0.61–1.24)
GFR, Estimated: 9 mL/min — ABNORMAL LOW (ref >60)
Glucose, Bld: 110 mg/dL — ABNORMAL HIGH (ref 70–99)
Potassium: 4.4 mmol/L (ref 3.5–5.1)
Sodium: 130 mmol/L — ABNORMAL LOW (ref 135–145)

## 2023-02-24 LAB — HEPARIN LEVEL (UNFRACTIONATED)
Heparin Unfractionated: 0.2 [IU]/mL — ABNORMAL LOW (ref 0.30–0.70)
Heparin Unfractionated: <0.1 [IU]/mL — ABNORMAL LOW (ref 0.30–0.70)

## 2023-02-24 LAB — MAGNESIUM: Magnesium: 1.9 mg/dL (ref 1.7–2.4)

## 2023-02-24 LAB — CBC WITH DIFFERENTIAL/PLATELET
Abs Immature Granulocytes: 0.16 10*3/uL — ABNORMAL HIGH (ref 0.00–0.07)
Basophils Absolute: 0.1 10*3/uL (ref 0.0–0.1)
Basophils Relative: 1 %
Eosinophils Absolute: 0.2 10*3/uL (ref 0.0–0.5)
Eosinophils Relative: 1 %
HCT: 34.3 % — ABNORMAL LOW (ref 39.0–52.0)
Hemoglobin: 10.9 g/dL — ABNORMAL LOW (ref 13.0–17.0)
Immature Granulocytes: 1 %
Lymphocytes Relative: 3 %
Lymphs Abs: 0.6 10*3/uL — ABNORMAL LOW (ref 0.7–4.0)
MCH: 30.2 pg (ref 26.0–34.0)
MCHC: 31.8 g/dL (ref 30.0–36.0)
MCV: 95 fL (ref 80.0–100.0)
Monocytes Absolute: 0.9 10*3/uL (ref 0.1–1.0)
Monocytes Relative: 5 %
Neutro Abs: 15.3 10*3/uL — ABNORMAL HIGH (ref 1.7–7.7)
Neutrophils Relative %: 89 %
Platelets: 298 10*3/uL (ref 150–400)
RBC: 3.61 MIL/uL — ABNORMAL LOW (ref 4.22–5.81)
RDW: 17.1 % — ABNORMAL HIGH (ref 11.5–15.5)
WBC: 17.1 10*3/uL — ABNORMAL HIGH (ref 4.0–10.5)
nRBC: 0 % (ref 0.0–0.2)

## 2023-02-24 LAB — TROPONIN I (HIGH SENSITIVITY): Troponin I (High Sensitivity): 6905 ng/L (ref <18)

## 2023-02-24 MED ORDER — CHLORHEXIDINE GLUCONATE CLOTH 2 % EX PADS: 6.0000 | MEDICATED_PAD | Freq: Every day | CUTANEOUS | Status: DC

## 2023-02-24 NOTE — Progress Notes (Addendum)
ANTICOAGULATION CONSULT NOTE - Follow Up  Pharmacy Consult for Heparin Indication: chest pain/ACS  Allergies  Allergen Reactions   Shellfish Allergy Hives    Patient Measurements: Height: 6\' 5"  (195.6 cm) Weight: 90.3 kg (199 lb 1.2 oz) IBW/kg (Calculated) : 89.1 Heparin Dosing Weight: 86kg   Vital Signs: Temp: 100.8 F (38.2 C) (09/12 0352) Temp Source: Oral (09/12 0352) BP: 104/61 (09/12 0541) Pulse Rate: 81 (09/12 0352)  Labs: Recent Labs    02/22/23 0320 02/23/23 0340 02/23/23 1445 02/24/23 0539  HGB 10.7* 11.7*  --   --   HCT 34.1* 37.6*  --   --   PLT 272 299  --   --   HEPARINUNFRC  --   --   --  0.20*  CREATININE 6.72*  --  9.13*  --   TROPONINIHS  --   --  6,707*  --     Estimated Creatinine Clearance: 9.4 mL/min (A) (by C-G formula based on SCr of 9.13 mg/dL (H)).   Medical History: Past Medical History:  Diagnosis Date   Arthritis    CAD (coronary artery disease)    STEMI with LAD stent by [facility] in 2005   Dependence on renal dialysis (HCC)    Depression    Diabetes mellitus without complication (HCC)    History of kidney stones    Hypertension    Kidney infection    MI (mitral incompetence)    Myocardial infarction (HCC)    Seasonal allergies     Medications:  Medications Prior to Admission  Medication Sig Dispense Refill Last Dose   acetaminophen (TYLENOL) 500 MG tablet Take 325 mg by mouth every 6 (six) hours as needed for mild pain or moderate pain.   02/17/2023   amitriptyline (ELAVIL) 25 MG tablet Take 25 mg by mouth at bedtime.   02/16/2023   aspirin EC 325 MG tablet Take 325mg s once daily on non dialysis days. Dialysis days are Mon, Wed, Fri   Past Week   atorvastatin (LIPITOR) 80 MG tablet Take 1 tablet (80 mg total) by mouth daily at 6 PM. 30 tablet 0 Past Week   brimonidine (ALPHAGAN) 0.2 % ophthalmic solution Place 1 drop into both eyes in the morning.   02/16/2023   hydrALAZINE (APRESOLINE) 50 MG tablet Take 1 tablet (50 mg total)  by mouth 3 (three) times daily. 90 tablet 5 Past Month   insulin glargine (LANTUS) 100 UNIT/ML injection Inject 10-15 Units into the skin at bedtime. Sliding scale over 200   02/16/2023   latanoprost (XALATAN) 0.005 % ophthalmic solution Place 2 drops into both eyes 2 (two) times daily.   02/16/2023   minoxidil (LONITEN) 10 MG tablet Take 0.5 tablets (5 mg total) by mouth at bedtime. Patient does not take as prescribed. Sometimes takes a 1/2 tablet depending on BP readings (Patient taking differently: Take 5-10 mg by mouth at bedtime. Patient does not take as prescribed. Sometimes takes a 1/2 tablet depending on BP readings) 30 tablet 5 Past Month   Multiple Vitamin (DAILY VITAMIN PO) Take 1 tablet by mouth daily as needed (supplement).   02/16/2023   Omega-3 Fatty Acids (FISH OIL) 1000 MG CAPS Take 1,000 mg by mouth daily.   02/16/2023   OVER THE COUNTER MEDICATION Take 1 tablet by mouth daily. Beet Root   02/16/2023   verapamil (CALAN-SR) 120 MG CR tablet Take 120 mg by mouth See admin instructions. Take 1 tablet in the morning on the days that you do  not have Dialysis. Do Not take any on the days you have Dialysis.   02/17/2023   Scheduled:   amitriptyline  25 mg Oral QHS   aspirin EC  81 mg Oral Daily   atorvastatin  80 mg Oral q1800   brimonidine  1 drop Both Eyes q AM   clopidogrel  75 mg Oral Q breakfast   influenza vaccine adjuvanted  0.5 mL Intramuscular Tomorrow-1000   insulin aspart  0-9 Units Subcutaneous TID WC   latanoprost  2 drop Both Eyes BID   loperamide  4 mg Oral Once   metoprolol tartrate  25 mg Oral BID   sevelamer carbonate  800 mg Oral TID WC   sodium chloride flush  3 mL Intravenous Q12H   Infusions:   sodium chloride     heparin Stopped (02/24/23 0605)   lactated ringers     piperacillin-tazobactam (ZOSYN)  IV 2.25 g (02/24/23 0524)   vancomycin 1,000 mg (02/23/23 1708)    Assessment: Pt had increase in troponin this PM but not CP. Heparin has been ordered to trend  troponin. He is an ESRD pt.   Hgb 11s, plt wnl  9/13 AM: RN called to inform patient had bleeding from fistula on left arm when heparin level was drawn and was bleeding through dressing. Held heparin per provider until level returned at 0.2 on 1300 units/hr (subtherapeutic). Patient lost about ~5cc of blood per provider and RN and okay to continue with heparin infusion at goal of 0.3-0.7. Last CBC stable- not drawn yet.  Goal of Therapy:  Heparin level 0.3-0.7 units/ml Monitor platelets by anticoagulation protocol: Yes   Plan:  Increase heparin infusion 1400 units/hr Check 8 hr heparin level then daily Monitor for continued bleeding and need for reducing goal of heparin  Arabella Merles, PharmD. Clinical Pharmacist 02/24/2023 6:52 AM

## 2023-02-24 NOTE — TOC Progression Note (Addendum)
Transition of Care [facility]) - Progression Note    Patient Details  Name: Brigg Mackler MRN: [id number] Date of Birth: 1951-01-16  [facility] CM/SW Contact  Delilah Shan, LCSWA Phone Number: 02/24/2023, 12:04 PM  Clinical Narrative:     CSW spoke with patient at bedside and provided SNF bed offers. Patient accepted SNF bed offer with [facility].CSW informed [place] renal navigator. All questions answered. CSW will add facility choice to patients insurance authorization.CSW submitted additional clinicals requested by patients insurance. Patients insurance authorization currently pending.Jill Side with [facility] confirmed SNF bed for patient. Jill Side confirmed facility can accomodate patients HD.CSW will continue to follow and assist with patients dc planning needs.  Patients passr was approved [id number] .  Expected Discharge Plan: Skilled Nursing Facility Barriers to Discharge: Continued Medical Work up  Expected Discharge Plan and Services In-house Referral: Clinical Social Work     Living arrangements for the past 2 months: Single Family Home                                       Social Determinants of Health (SDOH) Interventions SDOH Screenings   Food Insecurity: No Food Insecurity (02/17/2023)  Housing: Low Risk  (02/17/2023)  Transportation Needs: No Transportation Needs (02/17/2023)  Utilities: Not At Risk (02/17/2023)  Tobacco Use: Medium Risk (02/17/2023)    Readmission Risk Interventions     No data to display

## 2023-02-24 NOTE — Progress Notes (Signed)
[facility] Progress Note   Subjective:   Pt reports feeling well. Denies SOB, dizziness, abdominal pain and nausea. Reports he has L lateral rib pain with deep breaths only, has been present since admission per pt. No other CP reported.   Objective Vitals:   02/24/23 0352 02/24/23 0541 02/24/23 0700 02/24/23 1003  BP: 130/65 104/61 (!) 111/58 96/78  Pulse: 81  77 79  Resp: 18 20 18    Temp: (!) 100.8 F (38.2 C)  98.8 F (37.1 C)   TempSrc: Oral  Oral   SpO2: 96%  92%   Weight:      Height:       Physical Exam  General: Alert male in NAD Heart: RRR, no murmurs, rubs or gallops Lungs: CTA bilaterally, respirations unlabored on RA Abdomen: Soft, non-distended, +BS Extremities: No edema b/l lower extremities, discoloration to R toes Dialysis Access:  LUE AVG +t/b  Additional Objective Labs: Basic Metabolic Panel: Recent Labs  Lab 02/19/23 0651 02/21/23 0433 02/22/23 0320 02/23/23 1445 02/24/23 0539  NA 134* 127* 129* 128* 130*  K 4.8 5.9* 4.6 4.7 4.4  CL 91* 86* 90* 90* 91*  CO2 27 21* 24 21* 24  GLUCOSE 131* 215* 173* 71 110*  BUN 36* 75* 45* 68* 37*  CREATININE 5.82* 9.34* 6.72* 9.13* 6.39*  CALCIUM 9.2 9.4 8.6* 8.3* 8.7*  PHOS 4.9* 8.5*  --  8.3*  --    Liver Function Tests: Recent Labs  Lab 02/19/23 0651 02/21/23 0433 02/23/23 1445  ALBUMIN 2.5* 2.3* 2.0*   No results for input(s): "LIPASE", "AMYLASE" in the last 168 hours. CBC: Recent Labs  Lab 02/20/23 0632 02/21/23 0433 02/22/23 0320 02/23/23 0340 02/24/23 0539  WBC 19.5* 18.0* 15.2* 18.0* 17.1*  NEUTROABS 17.3* 16.2* 13.3* 15.9* 15.3*  HGB 11.7* 11.7* 10.7* 11.7* 10.9*  HCT 38.9* 36.4* 34.1* 37.6* 34.3*  MCV 97.5 93.1 91.2 95.9 95.0  PLT 303 288 272 299 298   Blood Culture    Component Value Date/Time   SDES BLOOD RIGHT HAND 02/20/2023 0746   SPECREQUEST  02/20/2023 0746    BOTTLES DRAWN AEROBIC ONLY Blood Culture adequate volume   CULT  02/20/2023 0746    NO GROWTH  4 DAYS Performed at [facility], [address], [place], [place] [address]    REPTSTATUS PENDING 02/20/2023 [phone]    Cardiac Enzymes: No results for input(s): "CKTOTAL", "CKMB", "CKMBINDEX", "TROPONINI" in the last 168 hours. CBG: Recent Labs  Lab 02/23/23 0742 02/23/23 1143 02/23/23 1923 02/23/23 2126 02/24/23 0802  GLUCAP 261* 211* 73 133* 120*   Iron Studies: No results for input(s): "IRON", "TIBC", "TRANSFERRIN", "FERRITIN" in the last 72 hours. @lablastinr3 @ Studies/Results: NM Pulmonary Perfusion  Result Date: 02/23/2023 CLINICAL DATA:  Concern for point medicine. EXAM: NUCLEAR MEDICINE PERFUSION LUNG SCAN TECHNIQUE: Perfusion images were obtained in multiple projections after intravenous injection of radiopharmaceutical. Ventilation scans intentionally deferred if perfusion scan and chest x-ray adequate for interpretation during COVID 19 epidemic. RADIOPHARMACEUTICALS:  4.4 mCi Tc-73m MAA IV COMPARISON:  Chest radiograph dated 02/23/2023. FINDINGS: Uniform perfusion of the lungs. No segmental or wedge shaped perfusion defect. IMPRESSION: No scintigraphic evidence of pulmonary embolism. Electronically Signed   By: Elgie Collard M.D.   On: 02/23/2023 16:53   DG Chest Port 1V same Day  Result Date: 02/23/2023 CLINICAL DATA:  Dyspnea EXAM: PORTABLE CHEST 1 VIEW COMPARISON:  Yesterday FINDINGS: Low volume chest with streaky density at the bases, asymmetric to the right where there  is volume loss and small pleural effusion. No change detected since recent CT. Cardiomegaly. CABG. IMPRESSION: 1. Unchanged atelectasis/scarring with small right pleural effusion. 2. Cardiomegaly without edema. Electronically Signed   By: Tiburcio Pea M.D.   On: 02/23/2023 09:21   VAS US CAROTID  Result Date: 02/22/2023 Carotid Arterial Duplex Study Patient Name:  [name]  Date of Exam:   02/22/2023 Medical Rec #: [id number]        Accession #:    [id number] Date of Birth: [birth year]         Patient Gender: M Patient Age:   72 years Exam Location:  [facility] Procedure:      VAS US CAROTID Referring Phys: Charlton Haws --------------------------------------------------------------------------------  Indications:      Bilateral bruits. Risk Factors:     Hypertension, hyperlipidemia, prior MI, coronary artery                   disease. Other Factors:    ESRD (HD), HX CABG. Comparison Study: Previous exam 04/02/2015 - bilateral 1-39% Performing Technologist: Jody Hill RVT, RDMS  Examination Guidelines: A complete evaluation includes B-mode imaging, spectral Doppler, color Doppler, and power Doppler as needed of all accessible portions of each vessel. Bilateral testing is considered an integral part of a complete examination. Limited examinations for reoccurring indications may be performed as noted.  Right Carotid Findings: +----------+--------+--------+--------+------------------+------------------+           PSV cm/sEDV cm/sStenosisPlaque DescriptionComments           +----------+--------+--------+--------+------------------+------------------+ CCA Prox  83      8                                 intimal thickening +----------+--------+--------+--------+------------------+------------------+ CCA Distal76      6                                 intimal thickening +----------+--------+--------+--------+------------------+------------------+ ICA Prox  130     22      40-59%  calcific                             +----------+--------+--------+--------+------------------+------------------+ ICA Mid   53      12                                                   +----------+--------+--------+--------+------------------+------------------+ ICA Distal60      15                                                   +----------+--------+--------+--------+------------------+------------------+ ECA       120     0               calcific                              +----------+--------+--------+--------+------------------+------------------+ +----------+--------+-------+----------------+-------------------+           PSV cm/sEDV cmsDescribe        Arm Pressure (  mmHG) +----------+--------+-------+----------------+-------------------+ [id number]             Multiphasic, WNL                    +----------+--------+-------+----------------+-------------------+ +---------+--------+--+--------+--+---------+ VertebralPSV cm/s46EDV cm/s10Antegrade +---------+--------+--+--------+--+---------+  Left Carotid Findings: +----------+-------+--------+--------+-----------------------+-----------------+           PSV    EDV cm/sStenosisPlaque Description     Comments                    cm/s                                                            +----------+-------+--------+--------+-----------------------+-----------------+ CCA Prox  95     8               heterogenous           intimal                                                                   thickening        +----------+-------+--------+--------+-----------------------+-----------------+ CCA Distal87     13              heterogenous and       intimal                                            calcific               thickening        +----------+-------+--------+--------+-----------------------+-----------------+ ICA Prox  104    21      1-39%   heterogenous                             +----------+-------+--------+--------+-----------------------+-----------------+ ICA Mid   114    32                                                       +----------+-------+--------+--------+-----------------------+-----------------+ ICA Distal95     24                                                       +----------+-------+--------+--------+-----------------------+-----------------+ ECA       99     0               calcific                                  +----------+-------+--------+--------+-----------------------+-----------------+ +----------+--------+--------+--------+-------------------+  PSV cm/sEDV cm/sDescribeArm Pressure (mmHG) +----------+--------+--------+--------+-------------------+ Subclavian307     60      Stenotic                    +----------+--------+--------+--------+-------------------+ +---------+--------+--+--------+-+---------+ VertebralPSV cm/s36EDV cm/s6Antegrade +---------+--------+--+--------+-+---------+   Summary: Right Carotid: Velocities in the right ICA are consistent with a 40-59% (lower                end of range) stenosis. Left Carotid: Velocities in the left ICA are consistent with a 1-39% stenosis. Vertebrals:  Bilateral vertebral arteries demonstrate antegrade flow. Subclavians: Left subclavian artery was stenotic. Normal flow hemodynamics were              seen in the right subclavian artery. *See table(s) above for measurements and observations.     Preliminary    Medications:  sodium chloride     heparin 1,400 Units/hr (02/24/23 0654)   lactated ringers     piperacillin-tazobactam (ZOSYN)  IV 2.25 g (02/24/23 0524)   vancomycin 1,000 mg (02/23/23 1708)    amitriptyline  25 mg Oral QHS   aspirin EC  81 mg Oral Daily   atorvastatin  80 mg Oral q1800   brimonidine  1 drop Both Eyes q AM   clopidogrel  75 mg Oral Q breakfast   influenza vaccine adjuvanted  0.5 mL Intramuscular Tomorrow-1000   insulin aspart  0-9 Units Subcutaneous TID WC   latanoprost  2 drop Both Eyes BID   loperamide  4 mg Oral Once   metoprolol tartrate  25 mg Oral BID   sevelamer carbonate  800 mg Oral TID WC   sodium chloride flush  3 mL Intravenous Q12H    Outpatient Dialysis Orders: [facility]) - MWF Time: 4h 15 min Qb: 500, Qd: 500 Bath: 2k/2Ca+ EDW: 82kg Heparin 2000u at start  Assessment/Plan: 1. 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 On ABXs; s/p angiogram on 9/10 per [facility] 2.Hypotension - Informed of hypotensive episode overnight Saturday. LR and 1x Midodrine dose were given. Hydralazine/Minoxidil were stopped. He remains on Metoprolol BID. Bps improved and patient feels better.  3. Possible PNA - Per recent CT, blood cultures drawn today, on ABXs, managed per primary 4. ESRD: On HD MWF. Has mild bleeding from AVF post HD, is on heparin. No current bleeding. Next HD tomorrow.  5. Renal lesions: CT chest showed numerous renal lesions BL. Per hospitalist, plan to schedule outpatient urology follow-up at discharge for further evaluation. 6. HTN/Vol - Bps stable here currently. Na low but improving. UF 2-3L tomorrow as tolerated. Per OP HD Nurse, he has a history of hypotension when nearing EDW. See #2. 7. Metabolic bone disease: Phos was 7.7 at admit so binders started. Phos 8.3, CTM for now. Calcium slightly elevated, not on VDRA 8. Anemia of CKD: Hgb at goal. Fe/ESA is not indicated at this time. 9. DM2: Managed by hospital service.  10. History CAD s/p CABG - on ASA/statin, troponins and BNP are trending up here, ECHO ordered and awaiting results, managed by primary. Troponin elevated but no CP, cardiology is following.   Rogers Blocker, PA-C 02/24/2023, 10:47 AM  [facility] Pager: [phone]

## 2023-02-24 NOTE — Progress Notes (Signed)
Pharmacy Antibiotic Note  [name] is a 72 y.o. male admitted on 02/17/2023 with  diabetic foot infection w/ gangrene . Patient is ESRD w/ MWF dialysis schedule. Pharmacy has been consulted for vancomycin dosing.  Afebrile, WBC 17.1 Vancomycin random level 9/10 AM = 31 high therapeutic after re-load on 9/9 Received vancomycin dose after HD session yesterday  Plan: Continue vancomycin 1000 mg IV every MWF w/ HD Zosyn 2.25 gm IV Q8h per MD Continue to monitor HD schedule and obtain vancomycin levels as indicated  Monitor cultures and follow clinical course and ability to narrow abx as able   Height: 6\' 5"  (195.6 cm) Weight: 90.3 kg (199 lb 1.2 oz) IBW/kg (Calculated) : 89.1  Temp (24hrs), Avg:99.1 F (37.3 C), Min:97.8 F (36.6 C), Max:100.8 F (38.2 C)  Recent Labs  Lab 02/19/23 0651 02/20/23 0632 02/21/23 0433 02/22/23 0320 02/23/23 0340 02/23/23 1445 02/24/23 0539  WBC 11.7* 19.5* 18.0* 15.2* 18.0*  --  17.1*  CREATININE 5.82*  --  9.34* 6.72*  --  9.13* 6.39*  LATICACIDVEN  --  5.2*  --   --   --   --   --   VANCORANDOM  --   --   --  31  --   --   --     Estimated Creatinine Clearance: 13.4 mL/min (A) (by C-G formula based on SCr of 6.39 mg/dL (H)).    Allergies  Allergen Reactions   Shellfish Allergy Hives    Antimicrobials this admission: CTX 9/5 >> 9/8 Doxycycline 9/8 x1 Zosyn 9/8 >> Vanco 9/5; 9/9 >>  Microbiology results: 9/8 Bcx: ngtd x4d  Thank you for allowing pharmacy to be a part of this patient's care.  Trixie Rude, PharmD Clinical Pharmacist 02/24/2023  12:01 PM

## 2023-02-24 NOTE — Plan of Care (Signed)
  Problem: Clinical Measurements: Goal: Ability to maintain clinical measurements within normal limits will improve Outcome: Progressing Goal: Respiratory complications will improve Outcome: Progressing Goal: Cardiovascular complication will be avoided Outcome: Progressing   Problem: Nutrition: Goal: Adequate nutrition will be maintained Outcome: Progressing   Problem: Pain Managment: Goal: General experience of comfort will improve Outcome: Progressing   Problem: Safety: Goal: Ability to remain free from injury will improve Outcome: Progressing   Problem: Skin Integrity: Goal: Risk for impaired skin integrity will decrease Outcome: Progressing   Problem: Skin Integrity: Goal: Risk for impaired skin integrity will decrease Outcome: Progressing   Problem: Tissue Perfusion: Goal: Adequacy of tissue perfusion will improve Outcome: Progressing   Problem: Cardiovascular: Goal: Ability to achieve and maintain adequate cardiovascular perfusion will improve Outcome: Progressing Goal: Vascular access site(s) Level 0-1 will be maintained Outcome: Progressing

## 2023-02-24 NOTE — Progress Notes (Signed)
Noted mild bleeding from left arm AV fistula. Patient states his fistula is usually dry and doesn't bleed. Dressing was reinforced. Informed pharmacist and Dr Julian Reil. Dr. Julian Reil ordered to hold heparin IV while waiting for heparin level result. No other signs of bleeding noted.

## 2023-02-24 NOTE — Plan of Care (Signed)

## 2023-02-24 NOTE — Progress Notes (Signed)
ANTICOAGULATION CONSULT NOTE - Follow Up  Pharmacy Consult for Heparin Indication: chest pain/ACS  Allergies  Allergen Reactions   Shellfish Allergy Hives    Patient Measurements: Height: 6\' 5"  (195.6 cm) Weight: 90.3 kg (199 lb 1.2 oz) IBW/kg (Calculated) : 89.1 Heparin Dosing Weight: 86kg   Vital Signs: Temp: 98.2 F (36.8 C) (09/12 2049) Temp Source: Oral (09/12 2049) BP: 161/76 (09/12 2049) Pulse Rate: 80 (09/12 2049)  Labs: Recent Labs    02/22/23 0320 02/23/23 0340 02/23/23 1445 02/24/23 0539 02/24/23 1950  HGB 10.7* 11.7*  --  10.9*  --   HCT 34.1* 37.6*  --  34.3*  --   PLT 272 299  --  298  --   HEPARINUNFRC  --   --   --  0.20* <0.10*  CREATININE 6.72*  --  9.13* 6.39*  --   TROPONINIHS  --   --  1,610* 6,905*  --     Estimated Creatinine Clearance: 13.4 mL/min (A) (by C-G formula based on SCr of 6.39 mg/dL (H)).   Medical History: Past Medical History:  Diagnosis Date   Arthritis    CAD (coronary artery disease)    STEMI with LAD stent by [facility] in 2005   Dependence on renal dialysis (HCC)    Depression    Diabetes mellitus without complication (HCC)    History of kidney stones    Hypertension    Kidney infection    MI (mitral incompetence)    Myocardial infarction (HCC)    Seasonal allergies     Medications:  Medications Prior to Admission  Medication Sig Dispense Refill Last Dose   acetaminophen (TYLENOL) 500 MG tablet Take 325 mg by mouth every 6 (six) hours as needed for mild pain or moderate pain.   02/17/2023   amitriptyline (ELAVIL) 25 MG tablet Take 25 mg by mouth at bedtime.   02/16/2023   aspirin EC 325 MG tablet Take 325mg s once daily on non dialysis days. Dialysis days are Mon, Wed, Fri   Past Week   atorvastatin (LIPITOR) 80 MG tablet Take 1 tablet (80 mg total) by mouth daily at 6 PM. 30 tablet 0 Past Week   brimonidine (ALPHAGAN) 0.2 % ophthalmic solution Place 1 drop into both eyes in the morning.   02/16/2023   hydrALAZINE  (APRESOLINE) 50 MG tablet Take 1 tablet (50 mg total) by mouth 3 (three) times daily. 90 tablet 5 Past Month   insulin glargine (LANTUS) 100 UNIT/ML injection Inject 10-15 Units into the skin at bedtime. Sliding scale over 200   02/16/2023   latanoprost (XALATAN) 0.005 % ophthalmic solution Place 2 drops into both eyes 2 (two) times daily.   02/16/2023   minoxidil (LONITEN) 10 MG tablet Take 0.5 tablets (5 mg total) by mouth at bedtime. Patient does not take as prescribed. Sometimes takes a 1/2 tablet depending on BP readings (Patient taking differently: Take 5-10 mg by mouth at bedtime. Patient does not take as prescribed. Sometimes takes a 1/2 tablet depending on BP readings) 30 tablet 5 Past Month   Multiple Vitamin (DAILY VITAMIN PO) Take 1 tablet by mouth daily as needed (supplement).   02/16/2023   Omega-3 Fatty Acids (FISH OIL) 1000 MG CAPS Take 1,000 mg by mouth daily.   02/16/2023   OVER THE COUNTER MEDICATION Take 1 tablet by mouth daily. Beet Root   02/16/2023   verapamil (CALAN-SR) 120 MG CR tablet Take 120 mg by mouth See admin instructions. Take 1 tablet in  the morning on the days that you do not have Dialysis. Do Not take any on the days you have Dialysis.   02/17/2023   Scheduled:   amitriptyline  25 mg Oral QHS   aspirin EC  81 mg Oral Daily   atorvastatin  80 mg Oral q1800   brimonidine  1 drop Both Eyes q AM   Chlorhexidine Gluconate Cloth  6 each Topical Q0600   clopidogrel  75 mg Oral Q breakfast   influenza vaccine adjuvanted  0.5 mL Intramuscular Tomorrow-1000   insulin aspart  0-9 Units Subcutaneous TID WC   latanoprost  2 drop Both Eyes BID   loperamide  4 mg Oral Once   metoprolol tartrate  25 mg Oral BID   sevelamer carbonate  800 mg Oral TID WC   sodium chloride flush  3 mL Intravenous Q12H   Infusions:   sodium chloride     heparin 1,400 Units/hr (02/24/23 1501)   lactated ringers     piperacillin-tazobactam (ZOSYN)  IV 2.25 g (02/24/23 1456)   vancomycin 1,000 mg  (02/23/23 1708)    Assessment: Pt had increase in troponin this PM but not CP. Heparin has been ordered to trend troponin. He is an ESRD pt.   Hgb 11s, plt wnl  Heparin level this evening required multiple sticks to get blood. Heparin level undetectable.  Per RN no known issues with IV, but pt is having some blood-tinged urine.  Goal of Therapy:  Heparin level 0.3-0.7 units/ml Monitor platelets by anticoagulation protocol: Yes   Plan:  Increase heparin infusion 1550 units/hr. Check 8 hr heparin level then daily Monitor for continued bleeding and need for reducing goal of heparin  Reece Leader, Colon Flattery, [facility] Clinical Pharmacist  02/24/2023 9:23 PM   [facility] pharmacy phone numbers are listed on [email or web address]

## 2023-02-24 NOTE — Progress Notes (Addendum)
[facility]  PROGRESS NOTE  [name] WUJ:[id number] DOB: [birth year] DOA: 02/17/2023 PCP: [facility]   Brief HPI:    72 y.o.  male with history of ESRD on HD MWF, DM-2, HTN, CAD s/p CABG-who presented [facility] with left diabetic foot with gangrenous appearing toes-patient was transferred to [facility] for vascular surgery evaluation.      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 Logan Bores as outpatient -X-ray of the foot did not show osteomyelitis -Will discharge on Augmentin for 7 days  Troponin elevation -Troponin went up to 6000 yesterday in hemodialysis -No chest pain, EKG nonischemic -Echocardiogram showed preserved EF with stable wall motion abnormality -Cardiology saw patient yesterday, did not recommend aggressive intervention -He was started on IV heparin  Episode of hypotension -Patient had episode of hypotension on 02/19/2023 -Started on antibiotics for possible sepsis -Blood cultures x 2 obtained on 02/20/2023 are negative to date  History of CAD status post CABG  ESRD -Patient on hemodialysis Monday Wednesday Friday -Nephrology following  Hypertension -Blood pressure dropped on multiple medications -Currently on beta-blocker and as needed hydralazine  Carotid bruit -Right ICA consistent with 40 to 59% stenosis -Left ICA consistent with 1 to 39% stenosis -No acute intervention required, follow-up vascular surgery as outpatient  Diabetes mellitus type 2 -Hemoglobin A1c 8.4 on 9/5 -Continue sliding scale insulin with NovoLog  Reduced RV systolic function with elevated pulmonary artery pressure -Post catheterization VQ scan  obtained, which was negative for pulmonary embolism  Nonspecific renal lesions noted on CT scan incidentally.  Outpatient urology follow-up, to be arranged by PCP postdischarge.     Medications     amitriptyline  25 mg Oral QHS   aspirin EC  81 mg Oral Daily   atorvastatin  80 mg Oral q1800   brimonidine  1 drop Both Eyes q AM   clopidogrel  75 mg Oral Q breakfast   influenza vaccine adjuvanted  0.5 mL Intramuscular Tomorrow-1000   insulin aspart  0-9 Units Subcutaneous TID WC   latanoprost  2 drop Both Eyes BID   loperamide  4 mg Oral Once   metoprolol tartrate  25 mg Oral BID   sevelamer carbonate  800 mg Oral TID WC   sodium chloride flush  3 mL Intravenous Q12H     Data Reviewed:   CBG:  Recent Labs  Lab 02/23/23 0742 02/23/23 1143 02/23/23 1923 02/23/23 2126 02/24/23 0802  GLUCAP 261* 211* 73 133* 120*    SpO2: 92 % O2 Flow Rate (L/min): 2 L/min    Vitals:   02/24/23 0352 02/24/23 0541 02/24/23 0700 02/24/23 1003  BP: 130/65 104/61 (!) 111/58 96/78  Pulse: 81  77 79  Resp: 18 20 18    Temp: (!) 100.8 F (38.2 C)  98.8 F (37.1 C)   TempSrc: Oral  Oral   SpO2: 96%  92%   Weight:      Height:          Data Reviewed:  Basic Metabolic Panel: Recent Labs  Lab 02/18/23 0534 02/19/23 0651 02/20/23 0632 02/21/23 0433 02/22/23 0320 02/23/23 1445 02/24/23 0539  NA 132* 134*  --  127* 129* 128* 130*  K 5.1 4.8  --  5.9* 4.6 4.7  4.4  CL 89* 91*  --  86* 90* 90* 91*  CO2 25 27  --  21* 24 21* 24  GLUCOSE 153* 131*  --  215* 173* 71 110*  BUN 63* 36*  --  75* 45* 68* 37*  CREATININE 7.78* 5.82*  --  9.34* 6.72* 9.13* 6.39*  CALCIUM 8.8* 9.2  --  9.4 8.6* 8.3* 8.7*  MG  --  2.1 2.4 2.4 2.2  --  1.9  PHOS 7.7* 4.9*  --  8.5*  --  8.3*  --     CBC: Recent Labs  Lab 02/20/23 0632 02/21/23 0433 02/22/23 0320 02/23/23 0340 02/24/23 0539  WBC 19.5* 18.0* 15.2* 18.0* 17.1*  NEUTROABS 17.3* 16.2* 13.3* 15.9* 15.3*  HGB 11.7* 11.7* 10.7* 11.7*  10.9*  HCT 38.9* 36.4* 34.1* 37.6* 34.3*  MCV 97.5 93.1 91.2 95.9 95.0  PLT 303 288 272 299 298    LFT Recent Labs  Lab 02/18/23 0534 02/19/23 0651 02/21/23 0433 02/23/23 1445  ALBUMIN 2.3* 2.5* 2.3* 2.0*     Antibiotics: Anti-infectives (From admission, onward)    Start     Dose/Rate Route Frequency Ordered Stop   02/23/23 1600  vancomycin (VANCOCIN) IVPB 1000 mg/200 mL premix        1,000 mg 200 mL/hr over 60 Minutes Intravenous Every M-W-F (Hemodialysis) 02/21/23 1439     02/21/23 1445  vancomycin (VANCOREADY) IVPB 1500 mg/300 mL        1,500 mg 150 mL/hr over 120 Minutes Intravenous  Once 02/21/23 1439 02/21/23 1715   02/20/23 1300  piperacillin-tazobactam (ZOSYN) IVPB 2.25 g       Note to Pharmacy: Zosyn 3.375 g IV q12h for CrCl < 20 mL/min   2.25 g 100 mL/hr over 30 Minutes Intravenous Every 8 hours 02/20/23 0840     02/20/23 0600  piperacillin-tazobactam (ZOSYN) IVPB 3.375 g  Status:  Discontinued       Note to Pharmacy: Zosyn 3.375 g IV q12h for CrCl < 20 mL/min   3.375 g 12.5 mL/hr over 240 Minutes Intravenous Every 12 hours 02/20/23 0448 02/20/23 0840   02/20/23 0600  doxycycline (VIBRAMYCIN) 100 mg in sodium chloride 0.9 % 250 mL IVPB  Status:  Discontinued        100 mg 125 mL/hr over 120 Minutes Intravenous 2 times daily 02/20/23 0518 02/20/23 0953   02/19/23 1630  vancomycin (VANCOCIN) IVPB 1000 mg/200 mL premix  Status:  Discontinued        1,000 mg 200 mL/hr over 60 Minutes Intravenous  Once 02/19/23 1538 02/21/23 1439   02/18/23 1600  vancomycin (VANCOCIN) IVPB 1000 mg/200 mL premix  Status:  Discontinued        1,000 mg 200 mL/hr over 60 Minutes Intravenous Every M-W-F (Hemodialysis) 02/17/23 1545 02/21/23 1439   02/18/23 1500  cefTRIAXone (ROCEPHIN) 2 g in sodium chloride 0.9 % 100 mL IVPB  Status:  Discontinued        2 g 200 mL/hr over 30 Minutes Intravenous Every 24 hours 02/17/23 1943 02/20/23 0446   02/17/23 1445  cefTRIAXone (ROCEPHIN) 2 g in  sodium chloride 0.9 % 100 mL IVPB        2 g 200 mL/hr over 30 Minutes Intravenous  Once 02/17/23 1443 02/17/23 1540   02/17/23 1245  vancomycin (VANCOCIN) IVPB 1000 mg/200 mL premix  Status:  Discontinued        1,000 mg 200 mL/hr over 60 Minutes Intravenous  Once 02/17/23 1232 02/17/23 1239  02/17/23 1245  vancomycin (VANCOREADY) IVPB 1750 mg/350 mL        1,750 mg 175 mL/hr over 120 Minutes Intravenous  Once 02/17/23 1240 02/17/23 1526        DVT prophylaxis: Heparin  Code Status: Full code  Family Communication: No family at bedside   CONSULTS nephrology, cardiology, vascular surgery   Subjective    Denies chest pain, troponin went up to 6000 yesterday.  Cardiology saw the patient, did not recommend cardiac cath.  He had no chest pain.  Was started on IV heparin.  Objective    Physical Examination:   General-appears in no acute distress Heart-S1-S2, regular, no murmur auscultated Lungs-clear to auscultation bilaterally, no wheezing or crackles auscultated Abdomen-soft, nontender, no organomegaly Extremities-no edema in the lower extremities Neuro-alert, oriented x3, no focal deficit noted  Status is: Inpatient:             Meredeth Ide   [facility] If 7PM-7AM, please contact night-coverage at [email or web address], Office  [phone]   02/24/2023, 10:43 AM  LOS: 7 days

## 2023-02-24 NOTE — Progress Notes (Signed)
Physical Therapy Treatment Patient Details Name: [name] MRN: [id number] DOB: [birth year] Today's Date: 02/24/2023   History of Present Illness 72 yo male with onset of sudden development of gangrene to L foot was admitted 9/5. Angiogram LLE via right CFA left PTA with laser atherectomy and angioplasty left SFA and popliteal artery and angioplasty left PTA and TPT 9/10. Potential amputation in future.  PMHx:  ESRD HD, DM, CAD, CABG, HTN, HLD,    PT Comments  Pt in bed upon arrival and agreeable to PT session. Worked on gait training with L post-op shoe to offload foot due to L digit gangrene. Pt with difficulty navigating RW around obstacles with MinA for safety and unsteadiness. Pt fatigued after ambulating 50 ft and required seated rest break and recliner push back to room. VSS on room air. Pt is progressing well towards goals. Acute PT to follow.    If plan is discharge home, recommend the following: A little help with walking and/or transfers;A little help with bathing/dressing/bathroom;Assistance with cooking/housework;Help with stairs or ramp for entrance;Supervision due to cognitive status;Assist for transportation   Can travel by private vehicle        Equipment Recommendations  None recommended by PT    Recommendations for Other Services       Precautions / Restrictions Precautions Precautions: Fall Required Braces or Orthoses: Other Brace Other Brace: L post-op shoe Restrictions Weight Bearing Restrictions: No     Mobility  Bed Mobility Overal bed mobility: Needs Assistance Bed Mobility: Supine to Sit     Supine to sit: Supervision, HOB elevated          Transfers Overall transfer level: Needs assistance Equipment used: Rolling walker (2 wheels) Transfers: Sit to/from Stand (x2) Sit to Stand: Min assist           General transfer comment: MinA for initial rise and steadying. Pt also has difficulty controlling descension     Ambulation/Gait Ambulation/Gait assistance: Min assist Gait Distance (Feet): 50 Feet Assistive device: Rolling walker (2 wheels) Gait Pattern/deviations: Step-to pattern, Decreased stride length, Trunk flexed, Wide base of support Gait velocity: reduced     General Gait Details: difficulty navigating obstacles with RW, picks up RW to turn and avoid obstacles. Pt has difficulty ambulating with L post-op shoe and takes short steps, cues for heel strike. Fatigued after 50 ft requiring seated rest break and recliner push back to room          Balance Overall balance assessment: Needs assistance Sitting-balance support: Feet supported Sitting balance-Leahy Scale: Good     Standing balance support: Bilateral upper extremity supported, During functional activity, Reliant on assistive device for balance Standing balance-Leahy Scale: Poor Standing balance comment: reliant on RW for balance                  Cognition Arousal: Alert Behavior During Therapy: Flat affect, Impulsive Overall Cognitive Status: Difficult to assess                       General Comments: pt is not aware of safety issues and family not comfortable to talk in front of him at eval           General Comments General comments (skin integrity, edema, etc.): VSS on RA      Pertinent Vitals/Pain Pain Assessment Pain Assessment: No/denies pain     PT Goals (current goals can now be found in the care plan section) Progress towards PT goals: Progressing toward  goals    Frequency    Min 1X/week       AM-PAC PT "6 Clicks" Mobility   Outcome Measure  Help needed turning from your back to your side while in a flat bed without using bedrails?: A Little Help needed moving from lying on your back to sitting on the side of a flat bed without using bedrails?: A Little Help needed moving to and from a bed to a chair (including a wheelchair)?: A Little Help needed standing up from a chair  using your arms (e.g., wheelchair or bedside chair)?: A Little Help needed to walk in hospital room?: A Little Help needed climbing 3-5 steps with a railing? : Total 6 Click Score: 16    End of Session Equipment Utilized During Treatment: Gait belt Activity Tolerance: Patient limited by fatigue Patient left: in chair;with call bell/phone within reach;with family/visitor present Nurse Communication: Mobility status (aware of no chair alarm under pt, pt agreed to call nursing when he wants to get back to bed) PT Visit Diagnosis: Unsteadiness on feet (R26.81);Muscle weakness (generalized) (M62.81);Difficulty in walking, not elsewhere classified (R26.2)     Time: 4098-1191 PT Time Calculation (min) (ACUTE ONLY): 37 min  Charges:    $Gait Training: 8-22 mins $Therapeutic Activity: 8-22 mins PT General Charges $$ ACUTE PT VISIT: 1 Visit                     Hilton Cork, PT, DPT Secure Chat Preferred  Rehab Office [phone]    Arturo Morton Brion Aliment 02/24/2023, 9:41 AM

## 2023-02-24 NOTE — Progress Notes (Addendum)
Rounding Note    Patient Name: [name] Date of Encounter: 02/24/2023   [facility] Cardiologist: Charlton Haws, MD   Subjective   He continues to deny chest pain, no palpitations or heart racing, no pain in left foot/toes  Inpatient Medications    Scheduled Meds:  amitriptyline  25 mg Oral QHS   aspirin EC  81 mg Oral Daily   atorvastatin  80 mg Oral q1800   brimonidine  1 drop Both Eyes q AM   clopidogrel  75 mg Oral Q breakfast   influenza vaccine adjuvanted  0.5 mL Intramuscular Tomorrow-1000   insulin aspart  0-9 Units Subcutaneous TID WC   latanoprost  2 drop Both Eyes BID   loperamide  4 mg Oral Once   metoprolol tartrate  25 mg Oral BID   sevelamer carbonate  800 mg Oral TID WC   sodium chloride flush  3 mL Intravenous Q12H   Continuous Infusions:  sodium chloride     heparin 1,400 Units/hr (02/24/23 0654)   lactated ringers     piperacillin-tazobactam (ZOSYN)  IV 2.25 g (02/24/23 0524)   vancomycin 1,000 mg (02/23/23 1708)   PRN Meds: sodium chloride, acetaminophen, hydrALAZINE, hydrALAZINE, labetalol, melatonin, morphine injection, ondansetron (ZOFRAN) IV, ondansetron **OR** [DISCONTINUED] ondansetron (ZOFRAN) IV, oxyCODONE, prochlorperazine, sodium chloride flush   Vital Signs    Vitals:   02/23/23 2132 02/24/23 0352 02/24/23 0541 02/24/23 0700  BP: (!) 106/55 130/65 104/61 (!) 111/58  Pulse: 84 81  77  Resp:  18 20 18   Temp:  (!) 100.8 F (38.2 C)  98.8 F (37.1 C)  TempSrc:  Oral  Oral  SpO2:  96%  92%  Weight:      Height:        Intake/Output Summary (Last 24 hours) at 02/24/2023 0852 Last data filed at 02/24/2023 0500 Gross per 24 hour  Intake 934.64 ml  Output 2500 ml  Net -1565.36 ml      02/23/2023    2:35 PM 02/21/2023    5:16 PM 02/21/2023    1:15 PM  Last 3 Weights  Weight (lbs) 199 lb 1.2 oz 186 lb 15.2 oz 191 lb 2.2 oz  Weight (kg) 90.3 kg 84.8 kg 86.7 kg      Telemetry    Sinus rhythm with HR 70s, prolonged  PR interval, 20 beat run of wide complex tachycardia in the setting of known RBBB - could be a p wave buried in the T wave - Personally Reviewed  ECG    No new tracings - Personally Reviewed  Physical Exam   GEN: No acute distress.   Neck: No JVD Cardiac: RRR, 3/6 systolic murmur Respiratory: Clear to auscultation bilaterally. GI: Soft, nontender, non-distended  MS: gangrenous left toes, warm Neuro:  Nonfocal  Psych: Normal affect   Labs    High Sensitivity Troponin:   Recent Labs  Lab 02/20/23 0431 02/20/23 0632 02/20/23 0941 02/23/23 1445 02/24/23 0539  TROPONINIHS 122* 245* 1,813* 6,707* 6,905*     Chemistry Recent Labs  Lab 02/19/23 0651 02/20/23 0632 02/21/23 0433 02/22/23 0320 02/23/23 1445  NA 134*  --  127* 129* 128*  K 4.8  --  5.9* 4.6 4.7  CL 91*  --  86* 90* 90*  CO2 27  --  21* 24 21*  GLUCOSE 131*  --  215* 173* 71  BUN 36*  --  75* 45* 68*  CREATININE 5.82*  --  9.34* 6.72* 9.13*  CALCIUM 9.2  --  9.4 8.6* 8.3*  MG 2.1 2.4 2.4 2.2  --   ALBUMIN 2.5*  --  2.3*  --  2.0*  GFRNONAA 10*  --  6* 8* 6*  ANIONGAP 16*  --  20* 15 17*    Lipids  Recent Labs  Lab 02/23/23 0340  CHOL 96  TRIG 104  HDL 37*  LDLCALC 38  CHOLHDL 2.6    Hematology Recent Labs  Lab 02/22/23 0320 02/23/23 0340 02/24/23 0539  WBC 15.2* 18.0* 17.1*  RBC 3.74* 3.92* 3.61*  HGB 10.7* 11.7* 10.9*  HCT 34.1* 37.6* 34.3*  MCV 91.2 95.9 95.0  MCH 28.6 29.8 30.2  MCHC 31.4 31.1 31.8  RDW 17.1* 17.2* 17.1*  PLT 272 299 298   Thyroid No results for input(s): "TSH", "FREET4" in the last 168 hours.  BNP Recent Labs  Lab 02/20/23 0646 02/21/23 0433 02/22/23 0320  BNP 1,153.5* 3,350.2* 2,554.9*    DDimer No results for input(s): "DDIMER" in the last 168 hours.   Radiology    NM Pulmonary Perfusion  Result Date: 02/23/2023 CLINICAL DATA:  Concern for point medicine. EXAM: NUCLEAR MEDICINE PERFUSION LUNG SCAN TECHNIQUE: Perfusion images were obtained in multiple  projections after intravenous injection of radiopharmaceutical. Ventilation scans intentionally deferred if perfusion scan and chest x-ray adequate for interpretation during COVID 19 epidemic. RADIOPHARMACEUTICALS:  4.4 mCi Tc-56m MAA IV COMPARISON:  Chest radiograph dated 02/23/2023. FINDINGS: Uniform perfusion of the lungs. No segmental or wedge shaped perfusion defect. IMPRESSION: No scintigraphic evidence of pulmonary embolism. Electronically Signed   By: Elgie Collard M.D.   On: 02/23/2023 16:53   DG Chest Port 1V same Day  Result Date: 02/23/2023 CLINICAL DATA:  Dyspnea EXAM: PORTABLE CHEST 1 VIEW COMPARISON:  Yesterday FINDINGS: Low volume chest with streaky density at the bases, asymmetric to the right where there is volume loss and small pleural effusion. No change detected since recent CT. Cardiomegaly. CABG. IMPRESSION: 1. Unchanged atelectasis/scarring with small right pleural effusion. 2. Cardiomegaly without edema. Electronically Signed   By: Tiburcio Pea M.D.   On: 02/23/2023 09:21   VAS US CAROTID  Result Date: 02/22/2023 Carotid Arterial Duplex Study Patient Name:  ROLLO UPPAL  Date of Exam:   02/22/2023 Medical Rec #: [id number]        Accession #:    [id number] Date of Birth: 05/12/1951        Patient Gender: M Patient Age:   72 years Exam Location:  [facility] Procedure:      VAS US CAROTID Referring Phys: Charlton Haws --------------------------------------------------------------------------------  Indications:      Bilateral bruits. Risk Factors:     Hypertension, hyperlipidemia, prior MI, coronary artery                   disease. Other Factors:    ESRD (HD), HX CABG. Comparison Study: Previous exam 04/02/2015 - bilateral 1-39% Performing Technologist: Jody Hill RVT, RDMS  Examination Guidelines: A complete evaluation includes B-mode imaging, spectral Doppler, color Doppler, and power Doppler as needed of all accessible portions of each vessel. Bilateral testing is  considered an integral part of a complete examination. Limited examinations for reoccurring indications may be performed as noted.  Right Carotid Findings: +----------+--------+--------+--------+------------------+------------------+           PSV cm/sEDV cm/sStenosisPlaque DescriptionComments           +----------+--------+--------+--------+------------------+------------------+ CCA Prox  83      8  intimal thickening +----------+--------+--------+--------+------------------+------------------+ CCA Distal76      6                                 intimal thickening +----------+--------+--------+--------+------------------+------------------+ ICA Prox  130     22      40-59%  calcific                             +----------+--------+--------+--------+------------------+------------------+ ICA Mid   53      12                                                   +----------+--------+--------+--------+------------------+------------------+ ICA Distal60      15                                                   +----------+--------+--------+--------+------------------+------------------+ ECA       120     0               calcific                             +----------+--------+--------+--------+------------------+------------------+ +----------+--------+-------+----------------+-------------------+           PSV cm/sEDV cmsDescribe        Arm Pressure (mmHG) +----------+--------+-------+----------------+-------------------+ [id number]             Multiphasic, WNL                    +----------+--------+-------+----------------+-------------------+ +---------+--------+--+--------+--+---------+ VertebralPSV cm/s46EDV cm/s10Antegrade +---------+--------+--+--------+--+---------+  Left Carotid Findings: +----------+-------+--------+--------+-----------------------+-----------------+           PSV    EDV cm/sStenosisPlaque  Description     Comments                    cm/s                                                            +----------+-------+--------+--------+-----------------------+-----------------+ CCA Prox  95     8               heterogenous           intimal                                                                   thickening        +----------+-------+--------+--------+-----------------------+-----------------+ CCA Distal87     13              heterogenous and       intimal  calcific               thickening        +----------+-------+--------+--------+-----------------------+-----------------+ ICA Prox  104    21      1-39%   heterogenous                             +----------+-------+--------+--------+-----------------------+-----------------+ ICA Mid   114    32                                                       +----------+-------+--------+--------+-----------------------+-----------------+ ICA Distal95     24                                                       +----------+-------+--------+--------+-----------------------+-----------------+ ECA       99     0               calcific                                 +----------+-------+--------+--------+-----------------------+-----------------+ +----------+--------+--------+--------+-------------------+           PSV cm/sEDV cm/sDescribeArm Pressure (mmHG) +----------+--------+--------+--------+-------------------+ Subclavian307     60      Stenotic                    +----------+--------+--------+--------+-------------------+ +---------+--------+--+--------+-+---------+ VertebralPSV cm/s36EDV cm/s6Antegrade +---------+--------+--+--------+-+---------+   Summary: Right Carotid: Velocities in the right ICA are consistent with a 40-59% (lower                end of range) stenosis. Left Carotid: Velocities in the left ICA are  consistent with a 1-39% stenosis. Vertebrals:  Bilateral vertebral arteries demonstrate antegrade flow. Subclavians: Left subclavian artery was stenotic. Normal flow hemodynamics were              seen in the right subclavian artery. *See table(s) above for measurements and observations.     Preliminary     Cardiac Studies   Carotid duplex 02/22/23: Summary:  Right Carotid: Velocities in the right ICA are consistent with a 40-59%  (lower end of range) stenosis.   Left Carotid: Velocities in the left ICA are consistent with a 1-39%  stenosis.   Vertebrals: Bilateral vertebral arteries demonstrate antegrade flow.  Subclavians: Left subclavian artery was stenotic. Normal flow hemodynamics were seen in the right subclavian artery.    Echo 02/21/23: 1. Left ventricular ejection fraction, by estimation, is 60 to 65%. The  left ventricle has normal function. The left ventricle has no regional  wall motion abnormalities. There is mild left ventricular hypertrophy.  Left ventricular diastolic parameters  are consistent with Grade II diastolic dysfunction (pseudonormalization).  There is the interventricular septum is flattened in diastole ('D' shaped  left ventricle), consistent with right ventricular volume overload.   2. Right ventricular systolic function is severely reduced. The right  ventricular size is severely enlarged. There is moderately elevated  pulmonary artery systolic pressure. The estimated right ventricular  systolic pressure is 53.2 mmHg.   3. No evidence of mitral  valve regurgitation.   4. Tricuspid valve regurgitation is moderate.   5. There is moderate calcification of the aortic valve. Aortic valve  regurgitation is not visualized. Mild to moderate aortic valve stenosis.   6. The inferior vena cava is dilated in size with <50% respiratory  variability, suggesting right atrial pressure of 15 mmHg.   Patient Profile     72 y.o. male  with a hx of CAD status post PCI to  LAD '06, 4v CABG (LIMA to LAD, SVG to diagonal, SVG to OM1, SVG to RCA)'16, hypertension, hyperlipidemia, ESRD on HD who is being seen for the evaluation of elevated troponin   Assessment & Plan    Elevated troponin - HS troponin 122 --> 245 --> 1813 --> 6707 --> 6905 - EKG remains nonischemic, known right bundle - reassuring echocardiogram this admission - no chest pain - on heparin gtt and DAPT with ASA and plavix - question demand ischemia related to lower extremity occlusion - may consider OP ischemic evaluation given current need for ABX    Wide complex tachycardia - 20 beat run in the setting of RBBB and prolonged PR interval - could be a p wave in the T wave complex - will add on Mg level and BMP today   CAD s/p CABG 2016 - continue antiplatelet therapy following PAD intervention - continue BB, statin   Left foot ulcer, gangrene, PAD - underwent left SFA intervention with balloon - no pain in left foot/toes   Chronic diastolic heart failure - volume managed by HD   Mild to moderate AS - loud murmur on exam   ESRD on HD - per nephrology   I will arrange cardiology follow up.     For questions or updates, please contact [facility] Please consult [email or web address] for contact info under        Signed, Marcelino Duster, PA  02/24/2023, 8:52 AM

## 2023-02-24 NOTE — Progress Notes (Signed)
Mobility Specialist Progress Note:   02/24/23 1500  Mobility  Activity Ambulated with assistance in hallway  Level of Assistance +2 (takes two people)  Press photographer wheel walker  Distance Ambulated (ft) 100 ft  Activity Response Tolerated well  Mobility Referral Yes  $Mobility charge 1 Mobility  Mobility Specialist Start Time (ACUTE ONLY) 1449  Mobility Specialist Stop Time (ACUTE ONLY) 1514  Mobility Specialist Time Calculation (min) (ACUTE ONLY) 25 min    Pre Mobility: 85 HR During Mobility: 88 HR Post Mobility:  83 HR  Pt received in BR, agreeable to mobility. C/o some fatigue, taking x1 standing rest break. Student-RN assisted in chair follow. Pt able to ambulate back to room w/o fault. Left in chair with call bell and all needs met.  [name] Mobility Specialist Please contact via Special educational needs teacher or Rehab office at [phone]

## 2023-02-24 NOTE — Inpatient Diabetes Management (Signed)
Inpatient Diabetes Program Recommendations  AACE/ADA: New Consensus Statement on Inpatient Glycemic Control (2015)  Target Ranges:  Prepandial:   less than 140 mg/dL      Peak postprandial:   less than 180 mg/dL (1-2 hours)      Critically ill patients:  140 - 180 mg/dL   Lab Results  Component Value Date   GLUCAP 146 (H) 02/24/2023   HGBA1C 8.4 (H) 02/17/2023    Review of Glycemic Control  Diabetes history: DM2 Outpatient Diabetes medications: Lnatus 15-20 units daily(depending on blood sugars) Current orders for Inpatient glycemic control: Novolog 0-9 units correction scale TID  Inpatient Diabetes Program Recommendations:   Spoke with patient at the bedside. Patient was diagnosed with diabetes in his teenage years. Was started on oral agents at that time, but has been on insulin for many years. States that he takes Lantus 15-20 units (depending on his blood sugar) and checks blood sugars at least 2-3 times per day. Patient's A1C is 8.4% and discussed significance of his blood sugars. Patient would like to go home instead of going to a SNF, but tried to encourage him that he probably needs to get stronger before going home.  Discussed the plate method for eating. States that he lives alone, but does have family to check on him everyday. Will continue to monitor blood sugars while in the hospital.  Smith Mince RN BSN CDE Diabetes Coordinator Pager: [phone]  8am-5pm

## 2023-02-25 ENCOUNTER — Telehealth: Payer: Self-pay

## 2023-02-25 DIAGNOSIS — E11628 Type 2 diabetes mellitus with other skin complications: Secondary | ICD-10-CM | POA: Diagnosis not present

## 2023-02-25 DIAGNOSIS — I96 Gangrene, not elsewhere classified: Secondary | ICD-10-CM | POA: Diagnosis not present

## 2023-02-25 DIAGNOSIS — I350 Nonrheumatic aortic (valve) stenosis: Secondary | ICD-10-CM | POA: Diagnosis not present

## 2023-02-25 DIAGNOSIS — R072 Precordial pain: Secondary | ICD-10-CM

## 2023-02-25 DIAGNOSIS — Z951 Presence of aortocoronary bypass graft: Secondary | ICD-10-CM | POA: Diagnosis not present

## 2023-02-25 DIAGNOSIS — Z01818 Encounter for other preprocedural examination: Secondary | ICD-10-CM

## 2023-02-25 DIAGNOSIS — R7989 Other specified abnormal findings of blood chemistry: Secondary | ICD-10-CM

## 2023-02-25 DIAGNOSIS — E871 Hypo-osmolality and hyponatremia: Secondary | ICD-10-CM | POA: Diagnosis not present

## 2023-02-25 LAB — HEPARIN LEVEL (UNFRACTIONATED): Heparin Unfractionated: 0.18 [IU]/mL — ABNORMAL LOW (ref 0.30–0.70)

## 2023-02-25 LAB — RENAL FUNCTION PANEL
Albumin: 1.9 g/dL — ABNORMAL LOW (ref 3.5–5.0)
Anion gap: 17 — ABNORMAL HIGH (ref 5–15)
BUN: 55 mg/dL — ABNORMAL HIGH (ref 8–23)
CO2: 23 mmol/L (ref 22–32)
Calcium: 8.7 mg/dL — ABNORMAL LOW (ref 8.9–10.3)
Chloride: 86 mmol/L — ABNORMAL LOW (ref 98–111)
Creatinine, Ser: 8.48 mg/dL — ABNORMAL HIGH (ref 0.61–1.24)
GFR, Estimated: 6 mL/min — ABNORMAL LOW (ref >60)
Glucose, Bld: 230 mg/dL — ABNORMAL HIGH (ref 70–99)
Phosphorus: 8.2 mg/dL — ABNORMAL HIGH (ref 2.5–4.6)
Potassium: 4.9 mmol/L (ref 3.5–5.1)
Sodium: 126 mmol/L — ABNORMAL LOW (ref 135–145)

## 2023-02-25 LAB — CBC
HCT: 32.4 % — ABNORMAL LOW (ref 39.0–52.0)
Hemoglobin: 10.1 g/dL — ABNORMAL LOW (ref 13.0–17.0)
MCH: 29.7 pg (ref 26.0–34.0)
MCHC: 31.2 g/dL (ref 30.0–36.0)
MCV: 95.3 fL (ref 80.0–100.0)
Platelets: 293 10*3/uL (ref 150–400)
RBC: 3.4 MIL/uL — ABNORMAL LOW (ref 4.22–5.81)
RDW: 17.2 % — ABNORMAL HIGH (ref 11.5–15.5)
WBC: 19.7 10*3/uL — ABNORMAL HIGH (ref 4.0–10.5)
nRBC: 0 % (ref 0.0–0.2)

## 2023-02-25 LAB — CULTURE, BLOOD (ROUTINE X 2)
Culture: NO GROWTH
Culture: NO GROWTH
Special Requests: ADEQUATE
Special Requests: ADEQUATE

## 2023-02-25 LAB — GLUCOSE, CAPILLARY
Glucose-Capillary: 240 mg/dL — ABNORMAL HIGH (ref 70–99)
Glucose-Capillary: 107 mg/dL — ABNORMAL HIGH (ref 70–99)
Glucose-Capillary: 199 mg/dL — ABNORMAL HIGH (ref 70–99)
Glucose-Capillary: 178 mg/dL — ABNORMAL HIGH (ref 70–99)

## 2023-02-25 MED ORDER — LIDOCAINE HCL (PF) 1 % IJ SOLN: 5.0000 mL | INTRAMUSCULAR | Status: DC | PRN

## 2023-02-25 MED ORDER — HEPARIN SODIUM (PORCINE) 1000 UNIT/ML DIALYSIS: 1000.0000 [IU] | INTRAMUSCULAR | Status: DC | PRN

## 2023-02-25 MED ORDER — DOXYCYCLINE HYCLATE 100 MG PO TABS
100.0000 mg | ORAL_TABLET | Freq: Two times a day (BID) | ORAL | Status: DC
  Filled 2023-02-25: qty 1

## 2023-02-25 MED ORDER — INSULIN ASPART 100 UNIT/ML IJ SOLN
0.0000 [IU] | Freq: Three times a day (TID) | INTRAMUSCULAR | Status: DC
  Administered 2023-02-25: 3 [IU] via SUBCUTANEOUS
  Administered 2023-02-26: 5 [IU] via SUBCUTANEOUS
  Administered 2023-02-26 – 2023-02-27 (×2): 3 [IU] via SUBCUTANEOUS
  Administered 2023-02-27: 2 [IU] via SUBCUTANEOUS
  Administered 2023-02-27: 3 [IU] via SUBCUTANEOUS
  Administered 2023-02-28: 5 [IU] via SUBCUTANEOUS
  Administered 2023-02-28: 2 [IU] via SUBCUTANEOUS

## 2023-02-25 MED ORDER — AMOXICILLIN-POT CLAVULANATE 500-125 MG PO TABS
1.0000 | ORAL_TABLET | ORAL | Status: DC
  Administered 2023-02-25 – 2023-02-28 (×4): 1 via ORAL
  Filled 2023-02-25 (×4): qty 1

## 2023-02-25 MED ORDER — ALTEPLASE 2 MG IJ SOLR: 2.0000 mg | Freq: Once | INTRAMUSCULAR | Status: DC | PRN

## 2023-02-25 MED ORDER — ANTICOAGULANT SODIUM CITRATE 4% (200MG/5ML) IV SOLN: 5.0000 mL | Status: DC | PRN

## 2023-02-25 MED ORDER — DOXYCYCLINE HYCLATE 100 MG PO TABS
100.0000 mg | ORAL_TABLET | Freq: Two times a day (BID) | ORAL | Status: DC
  Administered 2023-02-25 – 2023-02-28 (×6): 100 mg via ORAL
  Filled 2023-02-25 (×7): qty 1

## 2023-02-25 MED ORDER — PENTAFLUOROPROP-TETRAFLUOROETH EX AERO: 1.0000 | INHALATION_SPRAY | CUTANEOUS | Status: DC | PRN

## 2023-02-25 MED ORDER — LIDOCAINE-PRILOCAINE 2.5-2.5 % EX CREA: 1.0000 | TOPICAL_CREAM | CUTANEOUS | Status: DC | PRN

## 2023-02-25 NOTE — Procedures (Signed)
I was present at this dialysis session. I have reviewed the session itself and made appropriate changes.   Now off heparin with elevated troponins.  To follow-up closely with cardiology.  K of 4.9 on 3K bath -- use 2K.  He has chronic mild hyponatremia, sodium 126, addressed with dialysis.  Hemoglobin stable at 10.1.  Using LUE AVF.  No inpatient renal issues.  Ok to transition to outpt HD.     Filed Weights   02/21/23 1716 02/23/23 1435 02/25/23 0755  Weight: 84.8 kg 90.3 kg 87.2 kg    Recent Labs  Lab 02/25/23 0840  NA 126*  K 4.9  CL 86*  CO2 23  GLUCOSE 230*  BUN 55*  CREATININE 8.48*  CALCIUM 8.7*  PHOS 8.2*    Recent Labs  Lab 02/22/23 0320 02/23/23 0340 02/24/23 0539 02/25/23 0652  WBC 15.2* 18.0* 17.1* 19.7*  NEUTROABS 13.3* 15.9* 15.3*  --   HGB 10.7* 11.7* 10.9* 10.1*  HCT 34.1* 37.6* 34.3* 32.4*  MCV 91.2 95.9 95.0 95.3  PLT 272 299 298 293    Scheduled Meds:  amitriptyline  25 mg Oral QHS   aspirin EC  81 mg Oral Daily   atorvastatin  80 mg Oral q1800   brimonidine  1 drop Both Eyes q AM   clopidogrel  75 mg Oral Q breakfast   influenza vaccine adjuvanted  0.5 mL Intramuscular Tomorrow-1000   insulin aspart  0-9 Units Subcutaneous TID WC   latanoprost  2 drop Both Eyes BID   loperamide  4 mg Oral Once   metoprolol tartrate  25 mg Oral BID   sevelamer carbonate  800 mg Oral TID WC   sodium chloride flush  3 mL Intravenous Q12H   Continuous Infusions:  sodium chloride     anticoagulant sodium citrate     heparin 1,550 Units/hr (02/25/23 0736)   lactated ringers     piperacillin-tazobactam (ZOSYN)  IV 2.25 g (02/25/23 0518)   vancomycin 1,000 mg (02/23/23 1708)   PRN Meds:.sodium chloride, acetaminophen, alteplase, anticoagulant sodium citrate, heparin, hydrALAZINE, hydrALAZINE, labetalol, lidocaine (PF), lidocaine-prilocaine, melatonin, morphine injection, ondansetron (ZOFRAN) IV, ondansetron **OR** [DISCONTINUED] ondansetron (ZOFRAN) IV,  oxyCODONE, pentafluoroprop-tetrafluoroeth, prochlorperazine, sodium chloride flush   Sabra Heck  MD 02/25/2023, 9:56 AM

## 2023-02-25 NOTE — Plan of Care (Signed)
  Problem: Health Behavior/Discharge Planning: Goal: Ability to manage health-related needs will improve Outcome: Progressing   Problem: Clinical Measurements: Goal: Ability to maintain clinical measurements within normal limits will improve Outcome: Progressing Goal: Respiratory complications will improve Outcome: Progressing Goal: Cardiovascular complication will be avoided Outcome: Progressing   Problem: Activity: Goal: Risk for activity intolerance will decrease Outcome: Progressing   Problem: Nutrition: Goal: Adequate nutrition will be maintained Outcome: Progressing   Problem: Pain Managment: Goal: General experience of comfort will improve Outcome: Progressing   Problem: Safety: Goal: Ability to remain free from injury will improve Outcome: Progressing   Problem: Skin Integrity: Goal: Risk for impaired skin integrity will decrease Outcome: Progressing   Problem: Metabolic: Goal: Ability to maintain appropriate glucose levels will improve Outcome: Progressing   Problem: Nutritional: Goal: Maintenance of adequate nutrition will improve Outcome: Progressing Goal: Progress toward achieving an optimal weight will improve Outcome: Progressing   Problem: Cardiovascular: Goal: Ability to achieve and maintain adequate cardiovascular perfusion will improve Outcome: Progressing Goal: Vascular access site(s) Level 0-1 will be maintained Outcome: Progressing

## 2023-02-25 NOTE — Progress Notes (Addendum)
Left a message at [facility] for clinic manager requesting a return call to provide update on pt's d/c plan.  Olivia Canter Renal Navigator [phone]  Addendum at 4:01 pm: Update provided to clinic manager at pt's out-pt clinic. Staff advised that pt will d/c to snf once insurance auth for snf received. Clinic staff advised that should insurance auth be received on the weekend pt may/may not be able to d/c to snf. Clinic advised that it is unknown at this time if pt will be at treatment on Monday or not.

## 2023-02-25 NOTE — Progress Notes (Signed)
Received patient in bed.Awake,alert and oriented x 4.Consent verified.  Access use: Left upper arm AVF that worked well.  Duration of treatment : 3.5 hours.  Fluid removed : 2.5 liters.  Hemo comment:Tolerared treatment.  Hand off to the patient's nurse.

## 2023-02-25 NOTE — Progress Notes (Signed)
[facility]  PROGRESS NOTE  [name] HYQ:[id number] DOB: Dec 03, 1950 DOA: 02/17/2023 PCP: [facility]   Brief HPI:    72 y.o.  male with history of ESRD on HD MWF, DM-2, HTN, CAD s/p CABG-who presented [facility] ED with left diabetic foot with gangrenous appearing toes-patient was transferred to [facility] for vascular surgery evaluation.      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 Logan Bores as outpatient -X-ray of the foot did not show osteomyelitis -Will discharge on Augmentin and doxycycline for 10 days  Troponin elevation -Troponin went up to 6000 yesterday in hemodialysis -No chest pain, EKG nonischemic -Echocardiogram showed preserved EF with stable wall motion abnormality -Cardiology saw patient yesterday, did not recommend aggressive intervention -He was started on IV heparin; IV heparin has been discontinued -This is likely in setting of demand ischemia, plan for outpatient Lexiscan Myoview next week.  Episode of hypotension -Patient had episode of hypotension on 02/19/2023 -Started on antibiotics for possible sepsis -Blood cultures x 2 obtained on 02/20/2023 are negative to date  History of CAD status post CABG  ESRD -Patient on hemodialysis Monday Wednesday Friday -Nephrology following  Hypertension -Blood pressure dropped on multiple medications -Currently on beta-blocker and as needed hydralazine  Carotid bruit -Right ICA consistent with 40 to 59% stenosis -Left ICA consistent with 1 to 39% stenosis -No acute intervention required, follow-up vascular surgery as outpatient  Diabetes mellitus type 2 -Hemoglobin A1c 8.4 on  9/5 -Continue sliding scale insulin with NovoLog  Reduced RV systolic function with elevated pulmonary artery pressure -Post catheterization VQ scan obtained, which was negative for pulmonary embolism  Nonspecific renal lesions noted on CT scan incidentally.   Outpatient urology follow-up, to be arranged by PCP postdischarge.     Medications     amitriptyline  25 mg Oral QHS   amoxicillin-clavulanate  1 tablet Oral Q24H   aspirin EC  81 mg Oral Daily   atorvastatin  80 mg Oral q1800   brimonidine  1 drop Both Eyes q AM   clopidogrel  75 mg Oral Q breakfast   doxycycline  100 mg Oral Q12H   influenza vaccine adjuvanted  0.5 mL Intramuscular Tomorrow-1000   insulin aspart  0-15 Units Subcutaneous TID WC   latanoprost  2 drop Both Eyes BID   loperamide  4 mg Oral Once   metoprolol tartrate  25 mg Oral BID   sevelamer carbonate  800 mg Oral TID WC     Data Reviewed:   CBG:  Recent Labs  Lab 02/24/23 1217 02/24/23 1622 02/24/23 2138 02/25/23 0659 02/25/23 1242  GLUCAP 146* 202* 257* 240* 107*    SpO2: 97 % O2 Flow Rate (L/min): 2 L/min    Vitals:   02/25/23 1100 02/25/23 1146 02/25/23 1200 02/25/23 1426  BP: 117/63 127/64 (!) 117/58   Pulse: 74 76 79   Resp: 11 13 17 16   Temp:   98.1 F (36.7 C) 98.1 F (36.7 C)  TempSrc:    Oral  SpO2: 98% 98% 97%   Weight:   84.9 kg   Height:          Data Reviewed:  Basic Metabolic Panel: Recent Labs  Lab 02/19/23 0651 02/20/23 9528 02/21/23 4132  02/22/23 0320 02/23/23 1445 02/24/23 0539 02/25/23 0840  NA 134*  --  127* 129* 128* 130* 126*  K 4.8  --  5.9* 4.6 4.7 4.4 4.9  CL 91*  --  86* 90* 90* 91* 86*  CO2 27  --  21* 24 21* 24 23  GLUCOSE 131*  --  215* 173* 71 110* 230*  BUN 36*  --  75* 45* 68* 37* 55*  CREATININE 5.82*  --  9.34* 6.72* 9.13* 6.39* 8.48*  CALCIUM 9.2  --  9.4 8.6* 8.3* 8.7* 8.7*  MG 2.1 2.4 2.4 2.2  --  1.9  --   PHOS 4.9*  --  8.5*  --  8.3*  --  8.2*    CBC: Recent Labs   Lab 02/20/23 0632 02/21/23 0433 02/22/23 0320 02/23/23 0340 02/24/23 0539 02/25/23 0652  WBC 19.5* 18.0* 15.2* 18.0* 17.1* 19.7*  NEUTROABS 17.3* 16.2* 13.3* 15.9* 15.3*  --   HGB 11.7* 11.7* 10.7* 11.7* 10.9* 10.1*  HCT 38.9* 36.4* 34.1* 37.6* 34.3* 32.4*  MCV 97.5 93.1 91.2 95.9 95.0 95.3  PLT 303 288 272 299 298 293    LFT Recent Labs  Lab 02/19/23 0651 02/21/23 0433 02/23/23 1445 02/25/23 0840  ALBUMIN 2.5* 2.3* 2.0* 1.9*     Antibiotics: Anti-infectives (From admission, onward)    Start     Dose/Rate Route Frequency Ordered Stop   02/25/23 2200  doxycycline (VIBRA-TABS) tablet 100 mg        100 mg Oral Every 12 hours 02/25/23 1142 03/07/23 2159   02/25/23 1800  amoxicillin-clavulanate (AUGMENTIN) 500-125 MG per tablet 1 tablet        1 tablet Oral Every 24 hours 02/25/23 1126 03/07/23 1759   02/25/23 1130  doxycycline (VIBRA-TABS) tablet 100 mg  Status:  Discontinued        100 mg Oral Every 12 hours 02/25/23 1126 02/25/23 1142   02/23/23 1600  vancomycin (VANCOCIN) IVPB 1000 mg/200 mL premix  Status:  Discontinued        1,000 mg 200 mL/hr over 60 Minutes Intravenous Every M-W-F (Hemodialysis) 02/21/23 1439 02/25/23 1135   02/21/23 1445  vancomycin (VANCOREADY) IVPB 1500 mg/300 mL        1,500 mg 150 mL/hr over 120 Minutes Intravenous  Once 02/21/23 1439 02/21/23 1715   02/20/23 1300  piperacillin-tazobactam (ZOSYN) IVPB 2.25 g  Status:  Discontinued       Note to Pharmacy: Zosyn 3.375 g IV q12h for CrCl < 20 mL/min   2.25 g 100 mL/hr over 30 Minutes Intravenous Every 8 hours 02/20/23 0840 02/25/23 1126   02/20/23 0600  piperacillin-tazobactam (ZOSYN) IVPB 3.375 g  Status:  Discontinued       Note to Pharmacy: Zosyn 3.375 g IV q12h for CrCl < 20 mL/min   3.375 g 12.5 mL/hr over 240 Minutes Intravenous Every 12 hours 02/20/23 0448 02/20/23 0840   02/20/23 0600  doxycycline (VIBRAMYCIN) 100 mg in sodium chloride 0.9 % 250 mL IVPB  Status:  Discontinued         100 mg 125 mL/hr over 120 Minutes Intravenous 2 times daily 02/20/23 0518 02/20/23 0953   02/19/23 1630  vancomycin (VANCOCIN) IVPB 1000 mg/200 mL premix  Status:  Discontinued        1,000 mg 200 mL/hr over 60 Minutes Intravenous  Once 02/19/23 1538 02/21/23 1439   02/18/23 1600  vancomycin (VANCOCIN) IVPB 1000 mg/200 mL premix  Status:  Discontinued  1,000 mg 200 mL/hr over 60 Minutes Intravenous Every M-W-F (Hemodialysis) 02/17/23 1545 02/21/23 1439   02/18/23 1500  cefTRIAXone (ROCEPHIN) 2 g in sodium chloride 0.9 % 100 mL IVPB  Status:  Discontinued        2 g 200 mL/hr over 30 Minutes Intravenous Every 24 hours 02/17/23 1943 02/20/23 0446   02/17/23 1445  cefTRIAXone (ROCEPHIN) 2 g in sodium chloride 0.9 % 100 mL IVPB        2 g 200 mL/hr over 30 Minutes Intravenous  Once 02/17/23 1443 02/17/23 1540   02/17/23 1245  vancomycin (VANCOCIN) IVPB 1000 mg/200 mL premix  Status:  Discontinued        1,000 mg 200 mL/hr over 60 Minutes Intravenous  Once 02/17/23 1232 02/17/23 1239   02/17/23 1245  vancomycin (VANCOREADY) IVPB 1750 mg/350 mL        1,750 mg 175 mL/hr over 120 Minutes Intravenous  Once 02/17/23 1240 02/17/23 1526        DVT prophylaxis: Heparin  Code Status: Full code  Family Communication: No family at bedside   CONSULTS nephrology, cardiology, vascular surgery   Subjective   Denies chest pain or shortness of breath.  Cardiology has signed off.  No further intervention recommended.   Objective    Physical Examination:  Appears in no acute distress S1-S2, regular, no murmur auscultated Lungs clear to auscultation bilaterally Abdomen is soft, nontender, no organomegaly Extremities-left foot fifth gangrenous toes   Status is: Inpatient:             Meredeth Ide   [facility] If 7PM-7AM, please contact night-coverage at [email or web address], Office  [phone]   02/25/2023, 2:57 PM  LOS: 8 days

## 2023-02-25 NOTE — Progress Notes (Signed)
PT Cancellation Note  Patient Details Name: Kito Lacross MRN: [id number] DOB: [birth year]   Cancelled Treatment:    Reason Eval/Treat Not Completed: Fatigue/lethargy limiting ability to participate. Pt reports being fatigued from having HD this morning and has not been able to eat. Acute PT to follow-up as able.   Hilton Cork, PT, DPT Secure Chat Preferred  Rehab Office [phone]   Arturo Morton Brion Aliment 02/25/2023, 1:21 PM

## 2023-02-25 NOTE — Telephone Encounter (Addendum)
-----   Message from Charlton Haws sent at 02/24/2023  9:46 AM EDT ----- This is patient that needs lexiscan myovue next week and f/u PA    Placed order for myoview. Made patient an appointment with PA for follow-up on 03/10/23. Will send [employer] of instruction of myoview.

## 2023-02-25 NOTE — Progress Notes (Signed)
Talked with Pt's daughter in law over the phone. She would like to request for the Case Manager to call the son, Deronte Rindlisbacher, via phone number listed on the contacts in the morning with regards to Pt's transition of care.

## 2023-02-25 NOTE — TOC Progression Note (Signed)
Transition of Care [facility]) - Progression Note    Patient Details  Name: [name] MRN: [id number] Date of Birth: [birth year]  [facility] of [facility] CM/SW Contact  Baldemar Lenis, [place] Phone Number: 02/25/2023, 2:57 PM  Clinical Narrative:   Patient insurance authorization remains pending, asking for clarification on patient's prior level of function as well as IV antibiotic needs. CSW provided update to insurance, waiting on response.     Expected Discharge Plan: Skilled Nursing Facility Barriers to Discharge: Continued Medical Work up  Expected Discharge Plan and Services In-house Referral: Clinical Social Work     Living arrangements for the past 2 months: Single Family Home                                       Social Determinants of Health (SDOH) Interventions SDOH Screenings   Food Insecurity: No Food Insecurity (02/17/2023)  Housing: Low Risk  (02/17/2023)  Transportation Needs: No Transportation Needs (02/17/2023)  Utilities: Not At Risk (02/17/2023)  Tobacco Use: Medium Risk (02/17/2023)    Readmission Risk Interventions     No data to display

## 2023-02-25 NOTE — Progress Notes (Signed)
Rounding Note    Patient Name: [name] Date of Encounter: 02/25/2023  [facility] Cardiologist: Charlton Haws, MD   Subjective   No cardiac symptoms Seen at dialysis   Inpatient Medications    Scheduled Meds:  amitriptyline  25 mg Oral QHS   aspirin EC  81 mg Oral Daily   atorvastatin  80 mg Oral q1800   brimonidine  1 drop Both Eyes q AM   clopidogrel  75 mg Oral Q breakfast   influenza vaccine adjuvanted  0.5 mL Intramuscular Tomorrow-1000   insulin aspart  0-9 Units Subcutaneous TID WC   latanoprost  2 drop Both Eyes BID   loperamide  4 mg Oral Once   metoprolol tartrate  25 mg Oral BID   sevelamer carbonate  800 mg Oral TID WC   sodium chloride flush  3 mL Intravenous Q12H   Continuous Infusions:  sodium chloride     anticoagulant sodium citrate     heparin 1,550 Units/hr (02/25/23 0736)   lactated ringers     piperacillin-tazobactam (ZOSYN)  IV 2.25 g (02/25/23 0518)   vancomycin 1,000 mg (02/23/23 1708)   PRN Meds: sodium chloride, acetaminophen, alteplase, anticoagulant sodium citrate, heparin, hydrALAZINE, hydrALAZINE, labetalol, lidocaine (PF), lidocaine-prilocaine, melatonin, morphine injection, ondansetron (ZOFRAN) IV, ondansetron **OR** [DISCONTINUED] ondansetron (ZOFRAN) IV, oxyCODONE, pentafluoroprop-tetrafluoroeth, prochlorperazine, sodium chloride flush   Vital Signs    Vitals:   02/25/23 0755 02/25/23 0811 02/25/23 0830 02/25/23 0900  BP: (!) 112/55 (!) 108/57 112/75 (!) 144/60  Pulse: 71 70 73 75  Resp: (!) 9 13 15 11   Temp: 97.8 F (36.6 C)     TempSrc: Oral     SpO2: 97% 93% 97% 92%  Weight: 87.2 kg     Height:        Intake/Output Summary (Last 24 hours) at 02/25/2023 0943 Last data filed at 02/25/2023 0600 Gross per 24 hour  Intake 1308.65 ml  Output --  Net 1308.65 ml      02/25/2023    7:55 AM 02/23/2023    2:35 PM 02/21/2023    5:16 PM  Last 3 Weights  Weight (lbs) 192 lb 3.9 oz 199 lb 1.2 oz 186 lb 15.2 oz   Weight (kg) 87.2 kg 90.3 kg 84.8 kg      Telemetry    Sinus rhythm with HR 70s, prolonged PR interval, 20 beat run of wide complex tachycardia in the setting of known RBBB - could be a p wave buried in the T wave - Personally Reviewed  ECG    No new tracings - Personally Reviewed  Physical Exam   Chronically ill black male Post sternotomy AS murmur  Fistula  Left carotid burit Decreased peripheral pulses with gangrene left 2-5 toes  Labs    High Sensitivity Troponin:   Recent Labs  Lab 02/20/23 0431 02/20/23 0632 02/20/23 0941 02/23/23 1445 02/24/23 0539  TROPONINIHS 122* 245* 1,813* 6,707* 6,905*     Chemistry Recent Labs  Lab 02/21/23 0433 02/22/23 0320 02/23/23 1445 02/24/23 0539 02/25/23 0840  NA 127* 129* 128* 130* 126*  K 5.9* 4.6 4.7 4.4 4.9  CL 86* 90* 90* 91* 86*  CO2 21* 24 21* 24 23  GLUCOSE 215* 173* 71 110* 230*  BUN 75* 45* 68* 37* 55*  CREATININE 9.34* 6.72* 9.13* 6.39* 8.48*  CALCIUM 9.4 8.6* 8.3* 8.7* 8.7*  MG 2.4 2.2  --  1.9  --   ALBUMIN 2.3*  --  2.0*  --  1.9*  GFRNONAA 6* 8* 6* 9* 6*  ANIONGAP 20* 15 17* 15 17*    Lipids  Recent Labs  Lab 02/23/23 0340  CHOL 96  TRIG 104  HDL 37*  LDLCALC 38  CHOLHDL 2.6    Hematology Recent Labs  Lab 02/23/23 0340 02/24/23 0539 02/25/23 0652  WBC 18.0* 17.1* 19.7*  RBC 3.92* 3.61* 3.40*  HGB 11.7* 10.9* 10.1*  HCT 37.6* 34.3* 32.4*  MCV 95.9 95.0 95.3  MCH 29.8 30.2 29.7  MCHC 31.1 31.8 31.2  RDW 17.2* 17.1* 17.2*  PLT 299 298 293   Thyroid No results for input(s): "TSH", "FREET4" in the last 168 hours.  BNP Recent Labs  Lab 02/20/23 0646 02/21/23 0433 02/22/23 0320  BNP 1,153.5* 3,350.2* 2,554.9*    DDimer No results for input(s): "DDIMER" in the last 168 hours.   Radiology    NM Pulmonary Perfusion  Result Date: 02/23/2023 CLINICAL DATA:  Concern for point medicine. EXAM: NUCLEAR MEDICINE PERFUSION LUNG SCAN TECHNIQUE: Perfusion images were obtained in multiple  projections after intravenous injection of radiopharmaceutical. Ventilation scans intentionally deferred if perfusion scan and chest x-ray adequate for interpretation during COVID 19 epidemic. RADIOPHARMACEUTICALS:  4.4 mCi Tc-47m MAA IV COMPARISON:  Chest radiograph dated 02/23/2023. FINDINGS: Uniform perfusion of the lungs. No segmental or wedge shaped perfusion defect. IMPRESSION: No scintigraphic evidence of pulmonary embolism. Electronically Signed   By: Elgie Collard M.D.   On: 02/23/2023 16:53    Cardiac Studies   Carotid duplex 02/22/23: Summary:  Right Carotid: Velocities in the right ICA are consistent with a 40-59%  (lower end of range) stenosis.   Left Carotid: Velocities in the left ICA are consistent with a 1-39%  stenosis.   Vertebrals: Bilateral vertebral arteries demonstrate antegrade flow.  Subclavians: Left subclavian artery was stenotic. Normal flow hemodynamics were seen in the right subclavian artery.    Echo 02/21/23: 1. Left ventricular ejection fraction, by estimation, is 60 to 65%. The  left ventricle has normal function. The left ventricle has no regional  wall motion abnormalities. There is mild left ventricular hypertrophy.  Left ventricular diastolic parameters  are consistent with Grade II diastolic dysfunction (pseudonormalization).  There is the interventricular septum is flattened in diastole ('D' shaped  left ventricle), consistent with right ventricular volume overload.   2. Right ventricular systolic function is severely reduced. The right  ventricular size is severely enlarged. There is moderately elevated  pulmonary artery systolic pressure. The estimated right ventricular  systolic pressure is 53.2 mmHg.   3. No evidence of mitral valve regurgitation.   4. Tricuspid valve regurgitation is moderate.   5. There is moderate calcification of the aortic valve. Aortic valve  regurgitation is not visualized. Mild to moderate aortic valve stenosis.   6.  The inferior vena cava is dilated in size with <50% respiratory  variability, suggesting right atrial pressure of 15 mmHg.   Patient Profile     72 y.o. male  with a hx of CAD status post PCI to LAD '06, 4v CABG (LIMA to LAD, SVG to diagonal, SVG to OM1, SVG to RCA)'16, hypertension, hyperlipidemia, ESRD on HD who is being seen for the evaluation of elevated troponin   Assessment & Plan    Elevated troponin - HS troponin 122 --> 245 --> 1813 --> 6707 --> 6905 - EKG remains nonischemic, known right bundle - reassuring echocardiogram this admission No RWMA;s  - no chest pain -  DAPT with ASA and plavix  D/C heparin  -  question demand ischemia related to lower extremity occlusion - Plan for outpatient lexiscan myovue next week May need risk stratification For amputation in 4-6 weeks    CAD s/p CABG 2016 - continue antiplatelet therapy following PAD intervention - continue BB, statin   Left foot ulcer, gangrene, PAD - underwent left SFA intervention with balloon - no pain in left foot/toes -  F/U VVS [facility] Able to get good PT flow to foot  - ? What antibiotic oral patient will be d/c on    Chronic diastolic heart failure - volume managed by HD   Mild to moderate AS - loud murmur on exam - mean gradient 20.4 peak 36.8 mmHg    ESRD on HD - per nephrology   I will arrange cardiology follow up.     For questions or updates, please contact [facility] Please consult [email or web address] for contact info under        Signed, Charlton Haws, MD  02/25/2023, 9:43 AM

## 2023-02-25 NOTE — Progress Notes (Signed)
Noted IV occlusion and IV team conmsulted. Paused heparin infusion and notified pharmacist.

## 2023-02-26 DIAGNOSIS — E871 Hypo-osmolality and hyponatremia: Secondary | ICD-10-CM | POA: Diagnosis not present

## 2023-02-26 DIAGNOSIS — E11628 Type 2 diabetes mellitus with other skin complications: Secondary | ICD-10-CM | POA: Diagnosis not present

## 2023-02-26 DIAGNOSIS — R7989 Other specified abnormal findings of blood chemistry: Secondary | ICD-10-CM | POA: Diagnosis not present

## 2023-02-26 DIAGNOSIS — I96 Gangrene, not elsewhere classified: Secondary | ICD-10-CM | POA: Diagnosis not present

## 2023-02-26 LAB — GLUCOSE, CAPILLARY
Glucose-Capillary: 201 mg/dL — ABNORMAL HIGH (ref 70–99)
Glucose-Capillary: 162 mg/dL — ABNORMAL HIGH (ref 70–99)
Glucose-Capillary: 107 mg/dL — ABNORMAL HIGH (ref 70–99)
Glucose-Capillary: 174 mg/dL — ABNORMAL HIGH (ref 70–99)

## 2023-02-26 MED ORDER — SEVELAMER CARBONATE 800 MG PO TABS
2400.0000 mg | ORAL_TABLET | Freq: Three times a day (TID) | ORAL | Status: DC
  Administered 2023-02-26 – 2023-02-28 (×7): 2400 mg via ORAL
  Filled 2023-02-26 (×7): qty 3

## 2023-02-26 MED ORDER — ORAL CARE MOUTH RINSE: 15.0000 mL | OROMUCOSAL | Status: DC | PRN

## 2023-02-26 NOTE — Progress Notes (Signed)
[facility]  PROGRESS NOTE  [name] AVW:[id number] DOB: 12/02/1950 DOA: 02/17/2023 PCP: [facility]   Brief HPI:    72 y.o.  male with history of ESRD on HD MWF, DM-2, HTN, CAD s/p CABG-who presented [facility] with left diabetic foot with gangrenous appearing toes-patient was transferred to [facility] for vascular surgery evaluation.      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 Logan Bores as outpatient -X-ray of the foot did not show osteomyelitis -Will discharge on Augmentin and doxycycline for 10 days  Troponin elevation -Troponin went up to 6000 yesterday in hemodialysis -No chest pain, EKG nonischemic -Echocardiogram showed preserved EF with stable wall motion abnormality -Cardiology saw patient yesterday, did not recommend aggressive intervention -He was started on IV heparin; IV heparin has been discontinued -This is likely in setting of demand ischemia, plan for outpatient Lexiscan Myoview next week.  Episode of hypotension -Patient had episode of hypotension on 02/19/2023 -Started on antibiotics for possible sepsis -Blood cultures x 2 obtained on 02/20/2023 are negative to date  History of CAD status post CABG  ESRD -Patient on hemodialysis Monday Wednesday Friday -Nephrology following  Hypertension -Blood pressure dropped on multiple medications -Currently on beta-blocker and as needed hydralazine  Carotid bruit -Right ICA consistent with 40 to 59% stenosis -Left ICA consistent with 1 to 39% stenosis -No acute intervention required, follow-up vascular surgery as outpatient  Diabetes mellitus type 2 -Hemoglobin A1c 8.4 on  9/5 -Continue sliding scale insulin with NovoLog  Reduced RV systolic function with elevated pulmonary artery pressure -Post catheterization VQ scan obtained, which was negative for pulmonary embolism  Nonspecific renal lesions noted on CT scan incidentally.   Outpatient urology follow-up, to be arranged by PCP postdischarge.     Medications     amitriptyline  25 mg Oral QHS   amoxicillin-clavulanate  1 tablet Oral Q24H   aspirin EC  81 mg Oral Daily   atorvastatin  80 mg Oral q1800   brimonidine  1 drop Both Eyes q AM   clopidogrel  75 mg Oral Q breakfast   doxycycline  100 mg Oral Q12H   influenza vaccine adjuvanted  0.5 mL Intramuscular Tomorrow-1000   insulin aspart  0-15 Units Subcutaneous TID WC   latanoprost  2 drop Both Eyes BID   loperamide  4 mg Oral Once   metoprolol tartrate  25 mg Oral BID   sevelamer carbonate  2,400 mg Oral TID WC     Data Reviewed:   CBG:  Recent Labs  Lab 02/25/23 0659 02/25/23 1242 02/25/23 1644 02/25/23 2124 02/26/23 0826  GLUCAP 240* 107* 199* 178* 201*    SpO2: 90 % O2 Flow Rate (L/min): 2 L/min    Vitals:   02/25/23 1426 02/25/23 1952 02/26/23 0451 02/26/23 0808  BP: (!) 101/57 102/88 131/65 113/60  Pulse: 75 85 73 74  Resp: 16 18 15    Temp: 98.1 F (36.7 C) 98.9 F (37.2 C) 98.1 F (36.7 C)   TempSrc: Oral Oral Oral   SpO2: 100% 94% 90%   Weight:      Height:          Data Reviewed:  Basic Metabolic Panel: Recent Labs  Lab 02/20/23 [phone] 02/21/23 0433  02/22/23 0320 02/23/23 1445 02/24/23 0539 02/25/23 0840  NA  --  127* 129* 128* 130* 126*  K  --  5.9* 4.6 4.7 4.4 4.9  CL  --  86* 90* 90* 91* 86*  CO2  --  21* 24 21* 24 23  GLUCOSE  --  215* 173* 71 110* 230*  BUN  --  75* 45* 68* 37* 55*  CREATININE  --  9.34* 6.72* 9.13* 6.39* 8.48*  CALCIUM  --  9.4 8.6* 8.3* 8.7* 8.7*  MG 2.4 2.4 2.2  --  1.9  --   PHOS  --  8.5*  --  8.3*  --  8.2*    CBC: Recent Labs  Lab 02/20/23 0632 02/21/23 0433  02/22/23 0320 02/23/23 0340 02/24/23 0539 02/25/23 0652  WBC 19.5* 18.0* 15.2* 18.0* 17.1* 19.7*  NEUTROABS 17.3* 16.2* 13.3* 15.9* 15.3*  --   HGB 11.7* 11.7* 10.7* 11.7* 10.9* 10.1*  HCT 38.9* 36.4* 34.1* 37.6* 34.3* 32.4*  MCV 97.5 93.1 91.2 95.9 95.0 95.3  PLT 303 288 272 299 298 293    LFT Recent Labs  Lab 02/21/23 0433 02/23/23 1445 02/25/23 0840  ALBUMIN 2.3* 2.0* 1.9*     Antibiotics: Anti-infectives (From admission, onward)    Start     Dose/Rate Route Frequency Ordered Stop   02/25/23 2200  doxycycline (VIBRA-TABS) tablet 100 mg        100 mg Oral Every 12 hours 02/25/23 1142 03/07/23 2159   02/25/23 1800  amoxicillin-clavulanate (AUGMENTIN) 500-125 MG per tablet 1 tablet        1 tablet Oral Every 24 hours 02/25/23 1126 03/07/23 1759   02/25/23 1130  doxycycline (VIBRA-TABS) tablet 100 mg  Status:  Discontinued        100 mg Oral Every 12 hours 02/25/23 1126 02/25/23 1142   02/23/23 1600  vancomycin (VANCOCIN) IVPB 1000 mg/200 mL premix  Status:  Discontinued        1,000 mg 200 mL/hr over 60 Minutes Intravenous Every M-W-F (Hemodialysis) 02/21/23 1439 02/25/23 1135   02/21/23 1445  vancomycin (VANCOREADY) IVPB 1500 mg/300 mL        1,500 mg 150 mL/hr over 120 Minutes Intravenous  Once 02/21/23 1439 02/21/23 1715   02/20/23 1300  piperacillin-tazobactam (ZOSYN) IVPB 2.25 g  Status:  Discontinued       Note to Pharmacy: Zosyn 3.375 g IV q12h for CrCl < 20 mL/min   2.25 g 100 mL/hr over 30 Minutes Intravenous Every 8 hours 02/20/23 0840 02/25/23 1126   02/20/23 0600  piperacillin-tazobactam (ZOSYN) IVPB 3.375 g  Status:  Discontinued       Note to Pharmacy: Zosyn 3.375 g IV q12h for CrCl < 20 mL/min   3.375 g 12.5 mL/hr over 240 Minutes Intravenous Every 12 hours 02/20/23 0448 02/20/23 0840   02/20/23 0600  doxycycline (VIBRAMYCIN) 100 mg in sodium chloride 0.9 % 250 mL IVPB  Status:  Discontinued        100 mg 125 mL/hr over 120 Minutes Intravenous 2 times  daily 02/20/23 0518 02/20/23 0953   02/19/23 1630  vancomycin (VANCOCIN) IVPB 1000 mg/200 mL premix  Status:  Discontinued        1,000 mg 200 mL/hr over 60 Minutes Intravenous  Once 02/19/23 1538 02/21/23 1439   02/18/23 1600  vancomycin (VANCOCIN) IVPB 1000 mg/200 mL premix  Status:  Discontinued        1,000 mg 200 mL/hr over 60 Minutes Intravenous Every M-W-F (Hemodialysis) 02/17/23  1545 02/21/23 1439   02/18/23 1500  cefTRIAXone (ROCEPHIN) 2 g in sodium chloride 0.9 % 100 mL IVPB  Status:  Discontinued        2 g 200 mL/hr over 30 Minutes Intravenous Every 24 hours 02/17/23 1943 02/20/23 0446   02/17/23 1445  cefTRIAXone (ROCEPHIN) 2 g in sodium chloride 0.9 % 100 mL IVPB        2 g 200 mL/hr over 30 Minutes Intravenous  Once 02/17/23 1443 02/17/23 1540   02/17/23 1245  vancomycin (VANCOCIN) IVPB 1000 mg/200 mL premix  Status:  Discontinued        1,000 mg 200 mL/hr over 60 Minutes Intravenous  Once 02/17/23 1232 02/17/23 1239   02/17/23 1245  vancomycin (VANCOREADY) IVPB 1750 mg/350 mL        1,750 mg 175 mL/hr over 120 Minutes Intravenous  Once 02/17/23 1240 02/17/23 1526        DVT prophylaxis: Heparin  Code Status: Full code  Family Communication: No family at bedside   CONSULTS nephrology, cardiology, vascular surgery   Subjective   Denies any complaints.   Objective    Physical Examination:  General-appears in no acute distress Heart-S1-S2, regular, no murmur auscultated Lungs-clear to auscultation bilaterally, no wheezing or crackles auscultated Abdomen-soft, nontender, no organomegaly Extremities-no edema in the lower extremities Neuro-alert, oriented x3, no focal deficit noted  Status is: Inpatient:             Meredeth Ide   [facility] If 7PM-7AM, please contact night-coverage at [email or web address], Office  [phone]   02/26/2023, 10:29 AM  LOS: 9 days

## 2023-02-26 NOTE — Progress Notes (Signed)
Mobility Specialist Progress Note:   02/26/23 1550  Mobility  Activity Ambulated with assistance in hallway  Level of Assistance Contact guard assist, steadying assist  Assistive Device Front wheel walker  Distance Ambulated (ft) 150 ft  Activity Response Tolerated well  Mobility Referral Yes  $Mobility charge 1 Mobility  Mobility Specialist Start Time (ACUTE ONLY) 1540  Mobility Specialist Stop Time (ACUTE ONLY) 1556  Mobility Specialist Time Calculation (min) (ACUTE ONLY) 16 min   Pt agreeable to mobility session. Required only minG assist throughout ambulation. Pt expressed dislike of DARCO shoe as it makes one leg longer than the other (even with a shoe on). Left in recliner with all needs met.   Addison Lank Mobility Specialist Please contact via SecureChat or  Rehab office at [phone]

## 2023-02-26 NOTE — Progress Notes (Signed)
[facility] Progress Note   Subjective:  Seen in room. Dialyzed yesterday - 2.5L off, he says went fine. No CP/dyspnea.  Objective Vitals:   02/25/23 1426 02/25/23 1952 02/26/23 0451 02/26/23 0808  BP: (!) 101/57 102/88 131/65 113/60  Pulse: 75 85 73 74  Resp: 16 18 15    Temp: 98.1 F (36.7 C) 98.9 F (37.2 C) 98.1 F (36.7 C)   TempSrc: Oral Oral Oral   SpO2: 100% 94% 90%   Weight:      Height:       Physical Exam General: Well appearing man, NAD. Room air. Heart: RRR; 4/6 murmur Lungs: CTA anteriorly Abdomen: soft Extremities: no LE edema Dialysis Access: LUE AVG + bruit  Additional Objective Labs: Basic Metabolic Panel: Recent Labs  Lab 02/21/23 0433 02/22/23 0320 02/23/23 1445 02/24/23 0539 02/25/23 0840  NA 127*   < > 128* 130* 126*  K 5.9*   < > 4.7 4.4 4.9  CL 86*   < > 90* 91* 86*  CO2 21*   < > 21* 24 23  GLUCOSE 215*   < > 71 110* 230*  BUN 75*   < > 68* 37* 55*  CREATININE 9.34*   < > 9.13* 6.39* 8.48*  CALCIUM 9.4   < > 8.3* 8.7* 8.7*  PHOS 8.5*  --  8.3*  --  8.2*   < > = values in this interval not displayed.   Liver Function Tests: Recent Labs  Lab 02/21/23 0433 02/23/23 1445 02/25/23 0840  ALBUMIN 2.3* 2.0* 1.9*   CBC: Recent Labs  Lab 02/21/23 0433 02/22/23 0320 02/23/23 0340 02/24/23 0539 02/25/23 0652  WBC 18.0* 15.2* 18.0* 17.1* 19.7*  NEUTROABS 16.2* 13.3* 15.9* 15.3*  --   HGB 11.7* 10.7* 11.7* 10.9* 10.1*  HCT 36.4* 34.1* 37.6* 34.3* 32.4*  MCV 93.1 91.2 95.9 95.0 95.3  PLT 288 272 299 298 293   Medications:    amitriptyline  25 mg Oral QHS   amoxicillin-clavulanate  1 tablet Oral Q24H   aspirin EC  81 mg Oral Daily   atorvastatin  80 mg Oral q1800   brimonidine  1 drop Both Eyes q AM   clopidogrel  75 mg Oral Q breakfast   doxycycline  100 mg Oral Q12H   influenza vaccine adjuvanted  0.5 mL Intramuscular Tomorrow-1000   insulin aspart  0-15 Units Subcutaneous TID WC   latanoprost  2 drop Both  Eyes BID   loperamide  4 mg Oral Once   metoprolol tartrate  25 mg Oral BID   sevelamer carbonate  800 mg Oral TID WC    Dialysis Orders: MWF at [facility]) 4:15hr, 500/500, EDW 82kg, 2K/2Ca, AVG, heparin 2000 unit bolus   Assessment/Plan: 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 S/p LE angiogram 9/10 with laser atherectomy and angioplasty L superficial femoral and popliteal artery, angioplasty to L PTA. Plan is to let demarcate and then follow up with ortho for likely amputation. Hypotension 02/19/23: home meds held except BB, LR bolus + midodrine given -> better. Elevated troponin: EKG without ST elevations, echo without WMA - demand ischemia per cardiology. Possible PNA: Per CT findings, asymptomatic. On abx anyways. ESRD: Continue HD on usual MWF schedule - next 9/16. B renal cysts: CT chest showed numerous B cysts - has been seen on prior imaging - will pursue kidney MRI as outpatient. Volume: UP per weights with low Na, ^ UFG  with next HD as tolerated. Metabolic bone disease: CorrCa and Phos both high - no VDRA for now, ^ binder dose to 3/meals. Anemia of CKD: Hgb 10.1, trending down - will give ESA if drops further. T2DM: On insulin, per primary. Nutrition: Alb very low - adding supplements. CAD s/p CABG Dispo: Ok from renal standpoint -> will be going to SNF per notes.    Ozzie Hoyle, PA-C 02/26/2023, 9:05 AM  [employer]

## 2023-02-26 NOTE — TOC Progression Note (Signed)
Transition of Care [facility]) - Progression Note    Patient Details  Name: Chancelor Lichtenstein MRN: [id number] Date of Birth: 02/10/1951  [facility] CM/SW Contact  [address], Cash[place] Halterman [place], [place] Phone Number: 02/26/2023, 10:18 AM  Clinical Narrative:    [employer] contacted for status of skilled nursing facility authorization. Authorization remains pending -currently in Medical Director Review.  TOC to continue to follow  Zamauri Nez, LCSW Transition of Care    Expected Discharge Plan: Skilled Nursing Facility Barriers to Discharge: Continued Medical Work up  Expected Discharge Plan and Services In-house Referral: Clinical Social Work     Living arrangements for the past 2 months: Single Family Home                                       Social Determinants of Health (SDOH) Interventions SDOH Screenings   Food Insecurity: No Food Insecurity (02/17/2023)  Housing: Low Risk  (02/17/2023)  Transportation Needs: No Transportation Needs (02/17/2023)  Utilities: Not At Risk (02/17/2023)  Tobacco Use: Medium Risk (02/17/2023)    Readmission Risk Interventions     No data to display

## 2023-02-26 NOTE — Plan of Care (Signed)
Problem: Clinical Measurements: Goal: Ability to maintain clinical measurements within normal limits will improve Outcome: Progressing Goal: Will remain free from infection Outcome: Progressing Goal: Respiratory complications will improve Outcome: Progressing Goal: Cardiovascular complication will be avoided Outcome: Progressing   Problem: Nutrition: Goal: Adequate nutrition will be maintained Outcome: Progressing   Problem: Pain Managment: Goal: General experience of comfort will improve Outcome: Progressing   Problem: Safety: Goal: Ability to remain free from injury will improve Outcome: Progressing

## 2023-02-27 DIAGNOSIS — E871 Hypo-osmolality and hyponatremia: Secondary | ICD-10-CM | POA: Diagnosis not present

## 2023-02-27 DIAGNOSIS — I96 Gangrene, not elsewhere classified: Secondary | ICD-10-CM | POA: Diagnosis not present

## 2023-02-27 DIAGNOSIS — E11628 Type 2 diabetes mellitus with other skin complications: Secondary | ICD-10-CM | POA: Diagnosis not present

## 2023-02-27 DIAGNOSIS — R7989 Other specified abnormal findings of blood chemistry: Secondary | ICD-10-CM | POA: Diagnosis not present

## 2023-02-27 LAB — GLUCOSE, CAPILLARY
Glucose-Capillary: 166 mg/dL — ABNORMAL HIGH (ref 70–99)
Glucose-Capillary: 171 mg/dL — ABNORMAL HIGH (ref 70–99)
Glucose-Capillary: 175 mg/dL — ABNORMAL HIGH (ref 70–99)
Glucose-Capillary: 247 mg/dL — ABNORMAL HIGH (ref 70–99)

## 2023-02-27 MED ORDER — PROSOURCE PLUS PO LIQD
30.0000 mL | Freq: Two times a day (BID) | ORAL | Status: DC
  Administered 2023-02-27 – 2023-02-28 (×3): 30 mL via ORAL
  Filled 2023-02-27 (×3): qty 30

## 2023-02-27 MED ORDER — ZOLPIDEM TARTRATE 5 MG PO TABS
5.0000 mg | ORAL_TABLET | Freq: Once | ORAL | Status: AC
  Administered 2023-02-27: 5 mg via ORAL
  Filled 2023-02-27: qty 1

## 2023-02-27 MED ORDER — POLYETHYLENE GLYCOL 3350 17 G PO PACK
17.0000 g | PACK | Freq: Every day | ORAL | Status: DC
  Administered 2023-02-27 – 2023-02-28 (×3): 17 g via ORAL
  Filled 2023-02-27 (×3): qty 1

## 2023-02-27 NOTE — Progress Notes (Signed)
[facility] Progress Note   Subjective:   Seen in room - no new concerns. Next HD tomorrow. SNF authorization pending.  Objective Vitals:   02/26/23 0808 02/26/23 1129 02/26/23 1925 02/27/23 0415  BP: 113/60 123/61 (!) 158/65 128/67  Pulse: 74 71    Resp:  18 18 18   Temp:  98 F (36.7 C) 97.8 F (36.6 C) 98.1 F (36.7 C)  TempSrc:  Oral Oral Oral  SpO2:  95% 97% 93%  Weight:      Height:       Physical Exam General: Well appearing man, NAD. Room air. Heart: RRR; 4/6 murmur Lungs: CTA anteriorly Abdomen: soft Extremities: LLE 1+ edema, none in RLL Dialysis Access: LUE AVG + bruit  Additional Objective Labs: Basic Metabolic Panel: Recent Labs  Lab 02/21/23 0433 02/22/23 0320 02/23/23 1445 02/24/23 0539 02/25/23 0840  NA 127*   < > 128* 130* 126*  K 5.9*   < > 4.7 4.4 4.9  CL 86*   < > 90* 91* 86*  CO2 21*   < > 21* 24 23  GLUCOSE 215*   < > 71 110* 230*  BUN 75*   < > 68* 37* 55*  CREATININE 9.34*   < > 9.13* 6.39* 8.48*  CALCIUM 9.4   < > 8.3* 8.7* 8.7*  PHOS 8.5*  --  8.3*  --  8.2*   < > = values in this interval not displayed.   Liver Function Tests: Recent Labs  Lab 02/21/23 0433 02/23/23 1445 02/25/23 0840  ALBUMIN 2.3* 2.0* 1.9*   CBC: Recent Labs  Lab 02/21/23 0433 02/22/23 0320 02/23/23 0340 02/24/23 0539 02/25/23 0652  WBC 18.0* 15.2* 18.0* 17.1* 19.7*  NEUTROABS 16.2* 13.3* 15.9* 15.3*  --   HGB 11.7* 10.7* 11.7* 10.9* 10.1*  HCT 36.4* 34.1* 37.6* 34.3* 32.4*  MCV 93.1 91.2 95.9 95.0 95.3  PLT 288 272 299 298 293   Medications:   amitriptyline  25 mg Oral QHS   amoxicillin-clavulanate  1 tablet Oral Q24H   aspirin EC  81 mg Oral Daily   atorvastatin  80 mg Oral q1800   brimonidine  1 drop Both Eyes q AM   clopidogrel  75 mg Oral Q breakfast   doxycycline  100 mg Oral Q12H   influenza vaccine adjuvanted  0.5 mL Intramuscular Tomorrow-1000   insulin aspart  0-15 Units Subcutaneous TID WC   latanoprost  2 drop  Both Eyes BID   loperamide  4 mg Oral Once   metoprolol tartrate  25 mg Oral BID   polyethylene glycol  17 g Oral Daily   sevelamer carbonate  2,400 mg Oral TID WC    Dialysis Orders: MWF at [facility]) 4:15hr, 500/500, EDW 82kg, 2K/2Ca, AVG, heparin 2000 unit bolus   Assessment/Plan: 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 S/p LE angiogram 9/10 with laser atherectomy and angioplasty L superficial femoral and popliteal artery, angioplasty to L PTA. Plan is to let demarcate and then follow up with ortho for likely amputation. Elevated troponin: EKG without ST elevations, echo without WMA - demand ischemia per cardiology. Possible PNA: Per CT findings, asymptomatic. On abx anyways. ESRD: Continue HD on usual MWF schedule - next 9/16. B renal cysts: CT chest showed numerous B cysts - has been seen on prior imaging - will pursue kidney MRI as outpatient. BP/Volume: HyPOtensive episode 9/7, home meds held and given mido + IVF bolus.  Now UP per weights with low Na, ^ UFG with next HD as tolerated. Metabolic bone disease: CorrCa and Phos both high - no VDRA for now, ^ binder dose to 3/meals. Anemia of CKD: Hgb 10.1, trending down - will give ESA if drops further. T2DM: On insulin, per primary. Nutrition: Alb very low - adding supplements. CAD s/p CABG Dispo: Ok from renal standpoint -> will be going to SNF per notes.    Ozzie Hoyle, PA-C 02/27/2023, 8:53 AM  [employer]

## 2023-02-27 NOTE — Progress Notes (Signed)
[facility]  PROGRESS NOTE  [name] WUX:[id number] DOB: September 12, 1950 DOA: 02/17/2023 PCP: [facility]   Brief HPI:    72 y.o.  male with history of ESRD on HD MWF, DM-2, HTN, CAD s/p CABG-who presented [facility] ED with left diabetic foot with gangrenous appearing toes-patient was transferred to [facility] for vascular surgery evaluation.      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 Logan Bores as outpatient -Dr. Logan Bores has been contacted, he will arrange for outpatient follow-up -X-ray of the foot did not show osteomyelitis -Will discharge on Augmentin and doxycycline for 10 days  Troponin elevation -Troponin went up to 6000 yesterday in hemodialysis -No chest pain, EKG nonischemic -Echocardiogram showed preserved EF with stable wall motion abnormality -Cardiology saw patient yesterday, did not recommend aggressive intervention -He was started on IV heparin; IV heparin has been discontinued -This is likely in setting of demand ischemia, plan for outpatient Lexiscan Myoview next week.  Episode of hypotension -Patient had episode of hypotension on 02/19/2023 -Started on antibiotics for possible sepsis -Blood cultures x 2 obtained on 02/20/2023 are negative to date  History of CAD status post CABG  ESRD -Patient on hemodialysis Monday Wednesday Friday -Nephrology following  Hypertension -Blood pressure dropped on multiple medications -Currently on beta-blocker and as needed hydralazine  Carotid bruit -Right ICA consistent with 40 to 59% stenosis -Left ICA consistent with 1 to 39% stenosis -No acute intervention required, follow-up vascular surgery as  outpatient  Diabetes mellitus type 2 -Hemoglobin A1c 8.4 on 9/5 -Continue sliding scale insulin with NovoLog  Reduced RV systolic function with elevated pulmonary artery pressure -Post catheterization VQ scan obtained, which was negative for pulmonary embolism  Nonspecific renal lesions noted on CT scan incidentally.   Outpatient urology follow-up, to be arranged by PCP postdischarge.     Medications     (feeding supplement) PROSource Plus  30 mL Oral BID BM   amitriptyline  25 mg Oral QHS   amoxicillin-clavulanate  1 tablet Oral Q24H   aspirin EC  81 mg Oral Daily   atorvastatin  80 mg Oral q1800   brimonidine  1 drop Both Eyes q AM   clopidogrel  75 mg Oral Q breakfast   doxycycline  100 mg Oral Q12H   influenza vaccine adjuvanted  0.5 mL Intramuscular Tomorrow-1000   insulin aspart  0-15 Units Subcutaneous TID WC   latanoprost  2 drop Both Eyes BID   loperamide  4 mg Oral Once   metoprolol tartrate  25 mg Oral BID   polyethylene glycol  17 g Oral Daily   sevelamer carbonate  2,400 mg Oral TID WC     Data Reviewed:   CBG:  Recent Labs  Lab 02/26/23 0826 02/26/23 1125 02/26/23 1616 02/26/23 2210 02/27/23 0744  GLUCAP 201* 162* 107* 174* 166*    SpO2: 93 % O2 Flow Rate (L/min): 2 L/min    Vitals:   02/26/23 0808 02/26/23 1129 02/26/23 1925 02/27/23 0415  BP: 113/60 123/61 (!) 158/65 128/67  Pulse: 74 71    Resp:  18 18 18   Temp:  98 F (36.7 C) 97.8 F (36.6 C) 98.1 F (36.7 C)  TempSrc:  Oral Oral Oral  SpO2:  95% 97%  93%  Weight:      Height:          Data Reviewed:  Basic Metabolic Panel: Recent Labs  Lab 02/21/23 0433 02/22/23 0320 02/23/23 1445 02/24/23 0539 02/25/23 0840  NA 127* 129* 128* 130* 126*  K 5.9* 4.6 4.7 4.4 4.9  CL 86* 90* 90* 91* 86*  CO2 21* 24 21* 24 23  GLUCOSE 215* 173* 71 110* 230*  BUN 75* 45* 68* 37* 55*  CREATININE 9.34* 6.72* 9.13* 6.39* 8.48*  CALCIUM 9.4 8.6* 8.3* 8.7* 8.7*  MG 2.4 2.2  --  1.9  --    PHOS 8.5*  --  8.3*  --  8.2*    CBC: Recent Labs  Lab 02/21/23 0433 02/22/23 0320 02/23/23 0340 02/24/23 0539 02/25/23 0652  WBC 18.0* 15.2* 18.0* 17.1* 19.7*  NEUTROABS 16.2* 13.3* 15.9* 15.3*  --   HGB 11.7* 10.7* 11.7* 10.9* 10.1*  HCT 36.4* 34.1* 37.6* 34.3* 32.4*  MCV 93.1 91.2 95.9 95.0 95.3  PLT 288 272 299 298 293    LFT Recent Labs  Lab 02/21/23 0433 02/23/23 1445 02/25/23 0840  ALBUMIN 2.3* 2.0* 1.9*     Antibiotics: Anti-infectives (From admission, onward)    Start     Dose/Rate Route Frequency Ordered Stop   02/25/23 2200  doxycycline (VIBRA-TABS) tablet 100 mg        100 mg Oral Every 12 hours 02/25/23 1142 03/07/23 2159   02/25/23 1800  amoxicillin-clavulanate (AUGMENTIN) 500-125 MG per tablet 1 tablet        1 tablet Oral Every 24 hours 02/25/23 1126 03/07/23 1759   02/25/23 1130  doxycycline (VIBRA-TABS) tablet 100 mg  Status:  Discontinued        100 mg Oral Every 12 hours 02/25/23 1126 02/25/23 1142   02/23/23 1600  vancomycin (VANCOCIN) IVPB 1000 mg/200 mL premix  Status:  Discontinued        1,000 mg 200 mL/hr over 60 Minutes Intravenous Every M-W-F (Hemodialysis) 02/21/23 1439 02/25/23 1135   02/21/23 1445  vancomycin (VANCOREADY) IVPB 1500 mg/300 mL        1,500 mg 150 mL/hr over 120 Minutes Intravenous  Once 02/21/23 1439 02/21/23 1715   02/20/23 1300  piperacillin-tazobactam (ZOSYN) IVPB 2.25 g  Status:  Discontinued       Note to Pharmacy: Zosyn 3.375 g IV q12h for CrCl < 20 mL/min   2.25 g 100 mL/hr over 30 Minutes Intravenous Every 8 hours 02/20/23 0840 02/25/23 1126   02/20/23 0600  piperacillin-tazobactam (ZOSYN) IVPB 3.375 g  Status:  Discontinued       Note to Pharmacy: Zosyn 3.375 g IV q12h for CrCl < 20 mL/min   3.375 g 12.5 mL/hr over 240 Minutes Intravenous Every 12 hours 02/20/23 0448 02/20/23 0840   02/20/23 0600  doxycycline (VIBRAMYCIN) 100 mg in sodium chloride 0.9 % 250 mL IVPB  Status:  Discontinued        100 mg 125  mL/hr over 120 Minutes Intravenous 2 times daily 02/20/23 0518 02/20/23 0953   02/19/23 1630  vancomycin (VANCOCIN) IVPB 1000 mg/200 mL premix  Status:  Discontinued        1,000 mg 200 mL/hr over 60 Minutes Intravenous  Once 02/19/23 1538 02/21/23 1439   02/18/23 1600  vancomycin (VANCOCIN) IVPB 1000 mg/200 mL premix  Status:  Discontinued        1,000 mg 200 mL/hr over 60 Minutes Intravenous Every M-W-F (Hemodialysis) 02/17/23 1545 02/21/23 1439   02/18/23  1500  cefTRIAXone (ROCEPHIN) 2 g in sodium chloride 0.9 % 100 mL IVPB  Status:  Discontinued        2 g 200 mL/hr over 30 Minutes Intravenous Every 24 hours 02/17/23 1943 02/20/23 0446   02/17/23 1445  cefTRIAXone (ROCEPHIN) 2 g in sodium chloride 0.9 % 100 mL IVPB        2 g 200 mL/hr over 30 Minutes Intravenous  Once 02/17/23 1443 02/17/23 1540   02/17/23 1245  vancomycin (VANCOCIN) IVPB 1000 mg/200 mL premix  Status:  Discontinued        1,000 mg 200 mL/hr over 60 Minutes Intravenous  Once 02/17/23 1232 02/17/23 1239   02/17/23 1245  vancomycin (VANCOREADY) IVPB 1750 mg/350 mL        1,750 mg 175 mL/hr over 120 Minutes Intravenous  Once 02/17/23 1240 02/17/23 1526        DVT prophylaxis: Heparin  Code Status: Full code  Family Communication: No family at bedside   CONSULTS nephrology, cardiology, vascular surgery   Subjective   Denies any complaints.  Awaiting bed at skilled nursing facility   Objective    Physical Examination:  General-appears in no acute distress Heart-S1-S2, regular, no murmur auscultated Lungs-clear to auscultation bilaterally, no wheezing or crackles auscultated Abdomen-soft, nontender, no organomegaly Extremities-no edema in the lower extremities Neuro-alert, oriented x3, no focal deficit noted  Status is: Inpatient:             Meredeth Ide   [facility] If 7PM-7AM, please contact night-coverage at [email or web address], Office  [phone]   02/27/2023, 9:14 AM  LOS:  10 days

## 2023-02-28 DIAGNOSIS — Z743 Need for continuous supervision: Secondary | ICD-10-CM | POA: Diagnosis not present

## 2023-02-28 DIAGNOSIS — G8321 Monoplegia of upper limb affecting right dominant side: Secondary | ICD-10-CM | POA: Diagnosis not present

## 2023-02-28 DIAGNOSIS — Z7401 Bed confinement status: Secondary | ICD-10-CM | POA: Diagnosis not present

## 2023-02-28 DIAGNOSIS — I96 Gangrene, not elsewhere classified: Secondary | ICD-10-CM | POA: Diagnosis not present

## 2023-02-28 DIAGNOSIS — M6281 Muscle weakness (generalized): Secondary | ICD-10-CM | POA: Diagnosis not present

## 2023-02-28 DIAGNOSIS — I350 Nonrheumatic aortic (valve) stenosis: Secondary | ICD-10-CM | POA: Diagnosis not present

## 2023-02-28 DIAGNOSIS — G8324 Monoplegia of upper limb affecting left nondominant side: Secondary | ICD-10-CM | POA: Diagnosis not present

## 2023-02-28 DIAGNOSIS — E1152 Type 2 diabetes mellitus with diabetic peripheral angiopathy with gangrene: Secondary | ICD-10-CM | POA: Diagnosis not present

## 2023-02-28 DIAGNOSIS — Z992 Dependence on renal dialysis: Secondary | ICD-10-CM | POA: Diagnosis not present

## 2023-02-28 DIAGNOSIS — I252 Old myocardial infarction: Secondary | ICD-10-CM | POA: Diagnosis not present

## 2023-02-28 DIAGNOSIS — D72829 Elevated white blood cell count, unspecified: Secondary | ICD-10-CM | POA: Diagnosis not present

## 2023-02-28 DIAGNOSIS — N2581 Secondary hyperparathyroidism of renal origin: Secondary | ICD-10-CM | POA: Diagnosis not present

## 2023-02-28 DIAGNOSIS — Z8249 Family history of ischemic heart disease and other diseases of the circulatory system: Secondary | ICD-10-CM | POA: Diagnosis not present

## 2023-02-28 DIAGNOSIS — G459 Transient cerebral ischemic attack, unspecified: Secondary | ICD-10-CM | POA: Diagnosis not present

## 2023-02-28 DIAGNOSIS — E1165 Type 2 diabetes mellitus with hyperglycemia: Secondary | ICD-10-CM | POA: Diagnosis not present

## 2023-02-28 DIAGNOSIS — I70245 Atherosclerosis of native arteries of left leg with ulceration of other part of foot: Secondary | ICD-10-CM | POA: Diagnosis not present

## 2023-02-28 DIAGNOSIS — L089 Local infection of the skin and subcutaneous tissue, unspecified: Secondary | ICD-10-CM | POA: Diagnosis not present

## 2023-02-28 DIAGNOSIS — M79672 Pain in left foot: Secondary | ICD-10-CM | POA: Diagnosis not present

## 2023-02-28 DIAGNOSIS — D696 Thrombocytopenia, unspecified: Secondary | ICD-10-CM | POA: Diagnosis not present

## 2023-02-28 DIAGNOSIS — Z48812 Encounter for surgical aftercare following surgery on the circulatory system: Secondary | ICD-10-CM | POA: Diagnosis not present

## 2023-02-28 DIAGNOSIS — E785 Hyperlipidemia, unspecified: Secondary | ICD-10-CM | POA: Diagnosis not present

## 2023-02-28 DIAGNOSIS — E871 Hypo-osmolality and hyponatremia: Secondary | ICD-10-CM | POA: Diagnosis not present

## 2023-02-28 DIAGNOSIS — I340 Nonrheumatic mitral (valve) insufficiency: Secondary | ICD-10-CM | POA: Diagnosis not present

## 2023-02-28 DIAGNOSIS — R531 Weakness: Secondary | ICD-10-CM | POA: Diagnosis not present

## 2023-02-28 DIAGNOSIS — Z951 Presence of aortocoronary bypass graft: Secondary | ICD-10-CM | POA: Diagnosis not present

## 2023-02-28 DIAGNOSIS — E1169 Type 2 diabetes mellitus with other specified complication: Secondary | ICD-10-CM | POA: Diagnosis not present

## 2023-02-28 DIAGNOSIS — E11621 Type 2 diabetes mellitus with foot ulcer: Secondary | ICD-10-CM | POA: Diagnosis not present

## 2023-02-28 DIAGNOSIS — I10 Essential (primary) hypertension: Secondary | ICD-10-CM | POA: Diagnosis not present

## 2023-02-28 DIAGNOSIS — N186 End stage renal disease: Secondary | ICD-10-CM | POA: Diagnosis not present

## 2023-02-28 DIAGNOSIS — I2510 Atherosclerotic heart disease of native coronary artery without angina pectoris: Secondary | ICD-10-CM | POA: Diagnosis not present

## 2023-02-28 DIAGNOSIS — Z955 Presence of coronary angioplasty implant and graft: Secondary | ICD-10-CM | POA: Diagnosis not present

## 2023-02-28 DIAGNOSIS — D631 Anemia in chronic kidney disease: Secondary | ICD-10-CM | POA: Diagnosis not present

## 2023-02-28 DIAGNOSIS — Z66 Do not resuscitate: Secondary | ICD-10-CM | POA: Diagnosis not present

## 2023-02-28 DIAGNOSIS — L0889 Other specified local infections of the skin and subcutaneous tissue: Secondary | ICD-10-CM | POA: Diagnosis not present

## 2023-02-28 DIAGNOSIS — Z95820 Peripheral vascular angioplasty status with implants and grafts: Secondary | ICD-10-CM | POA: Diagnosis not present

## 2023-02-28 DIAGNOSIS — F32A Depression, unspecified: Secondary | ICD-10-CM | POA: Diagnosis present

## 2023-02-28 DIAGNOSIS — D689 Coagulation defect, unspecified: Secondary | ICD-10-CM | POA: Diagnosis not present

## 2023-02-28 DIAGNOSIS — E1122 Type 2 diabetes mellitus with diabetic chronic kidney disease: Secondary | ICD-10-CM | POA: Diagnosis not present

## 2023-02-28 DIAGNOSIS — R7989 Other specified abnormal findings of blood chemistry: Secondary | ICD-10-CM | POA: Diagnosis not present

## 2023-02-28 DIAGNOSIS — I120 Hypertensive chronic kidney disease with stage 5 chronic kidney disease or end stage renal disease: Secondary | ICD-10-CM | POA: Diagnosis not present

## 2023-02-28 DIAGNOSIS — E11649 Type 2 diabetes mellitus with hypoglycemia without coma: Secondary | ICD-10-CM | POA: Diagnosis not present

## 2023-02-28 DIAGNOSIS — E11628 Type 2 diabetes mellitus with other skin complications: Secondary | ICD-10-CM | POA: Diagnosis not present

## 2023-02-28 DIAGNOSIS — R072 Precordial pain: Secondary | ICD-10-CM | POA: Diagnosis not present

## 2023-02-28 DIAGNOSIS — D509 Iron deficiency anemia, unspecified: Secondary | ICD-10-CM | POA: Diagnosis not present

## 2023-02-28 DIAGNOSIS — Z794 Long term (current) use of insulin: Secondary | ICD-10-CM | POA: Diagnosis not present

## 2023-02-28 LAB — RENAL FUNCTION PANEL
Albumin: 1.7 g/dL — ABNORMAL LOW (ref 3.5–5.0)
Anion gap: 16 — ABNORMAL HIGH (ref 5–15)
BUN: 80 mg/dL — ABNORMAL HIGH (ref 8–23)
CO2: 22 mmol/L (ref 22–32)
Calcium: 8.5 mg/dL — ABNORMAL LOW (ref 8.9–10.3)
Chloride: 86 mmol/L — ABNORMAL LOW (ref 98–111)
Creatinine, Ser: 10.08 mg/dL — ABNORMAL HIGH (ref 0.61–1.24)
GFR, Estimated: 5 mL/min — ABNORMAL LOW (ref >60)
Glucose, Bld: 272 mg/dL — ABNORMAL HIGH (ref 70–99)
Phosphorus: 7.1 mg/dL — ABNORMAL HIGH (ref 2.5–4.6)
Potassium: 5.1 mmol/L (ref 3.5–5.1)
Sodium: 124 mmol/L — ABNORMAL LOW (ref 135–145)

## 2023-02-28 LAB — CBC
HCT: 30.6 % — ABNORMAL LOW (ref 39.0–52.0)
Hemoglobin: 9.6 g/dL — ABNORMAL LOW (ref 13.0–17.0)
MCH: 29 pg (ref 26.0–34.0)
MCHC: 31.4 g/dL (ref 30.0–36.0)
MCV: 92.4 fL (ref 80.0–100.0)
Platelets: 310 10*3/uL (ref 150–400)
RBC: 3.31 MIL/uL — ABNORMAL LOW (ref 4.22–5.81)
RDW: 17.1 % — ABNORMAL HIGH (ref 11.5–15.5)
WBC: 13.8 10*3/uL — ABNORMAL HIGH (ref 4.0–10.5)
nRBC: 0 % (ref 0.0–0.2)

## 2023-02-28 LAB — GLUCOSE, CAPILLARY
Glucose-Capillary: 127 mg/dL — ABNORMAL HIGH (ref 70–99)
Glucose-Capillary: 211 mg/dL — ABNORMAL HIGH (ref 70–99)

## 2023-02-28 MED ORDER — PENTAFLUOROPROP-TETRAFLUOROETH EX AERO: 1.0000 | INHALATION_SPRAY | CUTANEOUS | Status: DC | PRN

## 2023-02-28 MED ORDER — ONDANSETRON HCL 4 MG PO TABS: 4.0000 mg | ORAL_TABLET | Freq: Four times a day (QID) | ORAL | 0 refills | Status: DC | PRN

## 2023-02-28 MED ORDER — DOXYCYCLINE HYCLATE 100 MG PO TABS: 100.0000 mg | ORAL_TABLET | Freq: Two times a day (BID) | ORAL | 0 refills | Status: AC

## 2023-02-28 MED ORDER — POLYETHYLENE GLYCOL 3350 17 G PO PACK: 17.0000 g | PACK | Freq: Every day | ORAL | 0 refills | Status: DC

## 2023-02-28 MED ORDER — SEVELAMER CARBONATE 800 MG PO TABS: 2400.0000 mg | ORAL_TABLET | Freq: Three times a day (TID) | ORAL | Status: DC

## 2023-02-28 MED ORDER — INSULIN GLARGINE 100 UNIT/ML ~~LOC~~ SOLN: 10.0000 [IU] | Freq: Every day | SUBCUTANEOUS | Status: DC

## 2023-02-28 MED ORDER — CLOPIDOGREL BISULFATE 75 MG PO TABS: 75.0000 mg | ORAL_TABLET | Freq: Every day | ORAL | Status: DC

## 2023-02-28 MED ORDER — INSULIN ASPART 100 UNIT/ML IJ SOLN: 0.0000 [IU] | Freq: Three times a day (TID) | INTRAMUSCULAR | Status: DC

## 2023-02-28 MED ORDER — OXYCODONE HCL 5 MG PO TABS: 5.0000 mg | ORAL_TABLET | ORAL | 0 refills | Status: DC | PRN

## 2023-02-28 MED ORDER — ASPIRIN 81 MG PO TBEC: 81.0000 mg | DELAYED_RELEASE_TABLET | Freq: Every day | ORAL | Status: DC

## 2023-02-28 MED ORDER — HEPARIN SODIUM (PORCINE) 1000 UNIT/ML DIALYSIS
2000.0000 [IU] | Freq: Once | INTRAMUSCULAR | Status: AC
  Administered 2023-02-28: 2000 [IU] via INTRAVENOUS_CENTRAL
  Filled 2023-02-28: qty 2

## 2023-02-28 MED ORDER — AMOXICILLIN-POT CLAVULANATE 500-125 MG PO TABS: 1.0000 | ORAL_TABLET | ORAL | 0 refills | Status: AC

## 2023-02-28 MED ORDER — METOPROLOL TARTRATE 25 MG PO TABS: 25.0000 mg | ORAL_TABLET | Freq: Two times a day (BID) | ORAL | Status: DC

## 2023-02-28 NOTE — Progress Notes (Signed)
Advised that pt will d/c to snf today. Contacted [facility] to make clinic staff aware of pt's d/c today and that pt should resume on Wednesday. Clinic provided snf name as well. D/C summary and recent renal notes faxed to clinic for continuation of care.   Olivia Canter Renal Navigator [phone]

## 2023-02-28 NOTE — Plan of Care (Signed)
  Problem: Education: Goal: Knowledge of General Education information will improve Description Including pain rating scale, medication(s)/side effects and non-pharmacologic comfort measures Outcome: Progressing   

## 2023-02-28 NOTE — Progress Notes (Addendum)
[facility]  PROGRESS NOTE  Hendrik Kor WJX:[id number] DOB: 1950-09-05 DOA: 02/17/2023 PCP: [facility]   Brief HPI:    72 y.o.  male with history of ESRD on HD MWF, DM-2, HTN, CAD s/p CABG-who presented [facility] ED with left diabetic foot with gangrenous appearing toes-patient was transferred to [facility] for vascular surgery evaluation.      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 Logan Bores as outpatient -Dr. Logan Bores has been contacted, he will arrange for outpatient follow-up -X-ray of the foot did not show osteomyelitis -Will discharge on Augmentin and doxycycline for 10 days  Troponin elevation -Troponin went up to 6000 yesterday in hemodialysis -No chest pain, EKG nonischemic -Echocardiogram showed preserved EF with stable wall motion abnormality -Cardiology saw patient yesterday, did not recommend aggressive intervention -He was started on IV heparin; IV heparin has been discontinued -This is likely in setting of demand ischemia, plan for outpatient Lexiscan Myoview next week.  Episode of hypotension -Patient had episode of hypotension on 02/19/2023 -Started on antibiotics for possible sepsis -Blood cultures x 2 obtained on 02/20/2023 are negative to date  History of CAD status post CABG  ESRD -Patient on hemodialysis Monday Wednesday Friday -Nephrology following  Hypertension -Blood pressure dropped on multiple medications -Currently on beta-blocker and as needed hydralazine  Carotid bruit -Right ICA consistent with 40 to 59% stenosis -Left ICA consistent with 1 to 39% stenosis -No acute intervention required, follow-up vascular surgery as  outpatient  Diabetes mellitus type 2 -Hemoglobin A1c 8.4 on 9/5 -Continue sliding scale insulin with NovoLog  Reduced RV systolic function with elevated pulmonary artery pressure -Post catheterization VQ scan obtained, which was negative for pulmonary embolism  Nonspecific renal lesions noted on CT scan incidentally.   Outpatient urology follow-up, to be arranged by PCP postdischarge.   Awaiting bed at skilled nursing facility  Medications     (feeding supplement) PROSource Plus  30 mL Oral BID BM   amitriptyline  25 mg Oral QHS   amoxicillin-clavulanate  1 tablet Oral Q24H   aspirin EC  81 mg Oral Daily   atorvastatin  80 mg Oral q1800   brimonidine  1 drop Both Eyes q AM   clopidogrel  75 mg Oral Q breakfast   doxycycline  100 mg Oral Q12H   influenza vaccine adjuvanted  0.5 mL Intramuscular Tomorrow-1000   insulin aspart  0-15 Units Subcutaneous TID WC   latanoprost  2 drop Both Eyes BID   loperamide  4 mg Oral Once   metoprolol tartrate  25 mg Oral BID   polyethylene glycol  17 g Oral Daily   sevelamer carbonate  2,400 mg Oral TID WC     Data Reviewed:   CBG:  Recent Labs  Lab 02/26/23 2210 02/27/23 0744 02/27/23 1224 02/27/23 1616 02/27/23 2154  GLUCAP 174* 166* 171* 175* 247*    SpO2: 100 % O2 Flow Rate (L/min): 3 L/min    Vitals:   02/28/23 1130 02/28/23 1200 02/28/23 1230 02/28/23 1242  BP: 118/61 119/64 (!) 131/98 (!) 124/57  Pulse: 78 82 79 80  Resp: 18 16  18   Temp:    98.4 F (36.9 C)  TempSrc:      SpO2: 100% 99% 100%  100%  Weight:      Height:          Data Reviewed:  Basic Metabolic Panel: Recent Labs  Lab 02/22/23 0320 02/23/23 1445 02/24/23 0539 02/25/23 0840 02/28/23 0800  NA 129* 128* 130* 126* 124*  K 4.6 4.7 4.4 4.9 5.1  CL 90* 90* 91* 86* 86*  CO2 24 21* 24 23 22   GLUCOSE 173* 71 110* 230* 272*  BUN 45* 68* 37* 55* 80*  CREATININE 6.72* 9.13* 6.39* 8.48* 10.08*  CALCIUM 8.6* 8.3* 8.7* 8.7* 8.5*  MG 2.2  --  1.9   --   --   PHOS  --  8.3*  --  8.2* 7.1*    CBC: Recent Labs  Lab 02/22/23 0320 02/23/23 0340 02/24/23 0539 02/25/23 0652 02/28/23 0800  WBC 15.2* 18.0* 17.1* 19.7* 13.8*  NEUTROABS 13.3* 15.9* 15.3*  --   --   HGB 10.7* 11.7* 10.9* 10.1* 9.6*  HCT 34.1* 37.6* 34.3* 32.4* 30.6*  MCV 91.2 95.9 95.0 95.3 92.4  PLT 272 299 298 293 310    LFT Recent Labs  Lab 02/23/23 1445 02/25/23 0840 02/28/23 0800  ALBUMIN 2.0* 1.9* 1.7*     Antibiotics: Anti-infectives (From admission, onward)    Start     Dose/Rate Route Frequency Ordered Stop   02/25/23 2200  doxycycline (VIBRA-TABS) tablet 100 mg        100 mg Oral Every 12 hours 02/25/23 1142 03/07/23 2159   02/25/23 1800  amoxicillin-clavulanate (AUGMENTIN) 500-125 MG per tablet 1 tablet        1 tablet Oral Every 24 hours 02/25/23 1126 03/07/23 1759   02/25/23 1130  doxycycline (VIBRA-TABS) tablet 100 mg  Status:  Discontinued        100 mg Oral Every 12 hours 02/25/23 1126 02/25/23 1142   02/23/23 1600  vancomycin (VANCOCIN) IVPB 1000 mg/200 mL premix  Status:  Discontinued        1,000 mg 200 mL/hr over 60 Minutes Intravenous Every M-W-F (Hemodialysis) 02/21/23 1439 02/25/23 1135   02/21/23 1445  vancomycin (VANCOREADY) IVPB 1500 mg/300 mL        1,500 mg 150 mL/hr over 120 Minutes Intravenous  Once 02/21/23 1439 02/21/23 1715   02/20/23 1300  piperacillin-tazobactam (ZOSYN) IVPB 2.25 g  Status:  Discontinued       Note to Pharmacy: Zosyn 3.375 g IV q12h for CrCl < 20 mL/min   2.25 g 100 mL/hr over 30 Minutes Intravenous Every 8 hours 02/20/23 0840 02/25/23 1126   02/20/23 0600  piperacillin-tazobactam (ZOSYN) IVPB 3.375 g  Status:  Discontinued       Note to Pharmacy: Zosyn 3.375 g IV q12h for CrCl < 20 mL/min   3.375 g 12.5 mL/hr over 240 Minutes Intravenous Every 12 hours 02/20/23 0448 02/20/23 0840   02/20/23 0600  doxycycline (VIBRAMYCIN) 100 mg in sodium chloride 0.9 % 250 mL IVPB  Status:  Discontinued        100  mg 125 mL/hr over 120 Minutes Intravenous 2 times daily 02/20/23 0518 02/20/23 0953   02/19/23 1630  vancomycin (VANCOCIN) IVPB 1000 mg/200 mL premix  Status:  Discontinued        1,000 mg 200 mL/hr over 60 Minutes Intravenous  Once 02/19/23 1538 02/21/23 1439   02/18/23 1600  vancomycin (VANCOCIN) IVPB 1000 mg/200 mL premix  Status:  Discontinued        1,000 mg 200 mL/hr over 60 Minutes Intravenous Every M-W-F (Hemodialysis) 02/17/23 1545 02/21/23  1439   02/18/23 1500  cefTRIAXone (ROCEPHIN) 2 g in sodium chloride 0.9 % 100 mL IVPB  Status:  Discontinued        2 g 200 mL/hr over 30 Minutes Intravenous Every 24 hours 02/17/23 1943 02/20/23 0446   02/17/23 1445  cefTRIAXone (ROCEPHIN) 2 g in sodium chloride 0.9 % 100 mL IVPB        2 g 200 mL/hr over 30 Minutes Intravenous  Once 02/17/23 1443 02/17/23 1540   02/17/23 1245  vancomycin (VANCOCIN) IVPB 1000 mg/200 mL premix  Status:  Discontinued        1,000 mg 200 mL/hr over 60 Minutes Intravenous  Once 02/17/23 1232 02/17/23 1239   02/17/23 1245  vancomycin (VANCOREADY) IVPB 1750 mg/350 mL        1,750 mg 175 mL/hr over 120 Minutes Intravenous  Once 02/17/23 1240 02/17/23 1526        DVT prophylaxis: Heparin  Code Status: Full code  Family Communication: No family at bedside   CONSULTS nephrology, cardiology, vascular surgery   Subjective   Seen during hemodialysis.  Denies any complaints.   Objective    Physical Examination:  General-appears in no acute distress Heart-S1-S2, regular, no murmur auscultated Lungs-clear to auscultation bilaterally, no wheezing or crackles auscultated Abdomen-soft, nontender, no organomegaly Extremities-no edema in the lower extremities Neuro-alert, oriented x3, no focal deficit noted  Status is: Inpatient:             Meredeth Ide   [facility] If 7PM-7AM, please contact night-coverage at [email or web address], Office  [phone]   02/28/2023, 12:59 PM  LOS: 11 days

## 2023-02-28 NOTE — TOC Transition Note (Signed)
Transition of Care [facility]) - CM/SW Discharge Note   Patient Details  Name: [name] MRN: [id number] Date of Birth: 06-May-1951  [facility] CM/SW Contact:  Delilah Shan, LCSWA Phone Number: 02/28/2023, 4:01 PM   Clinical Narrative:     Patient will DC to: [facility]   Anticipated DC date: 02/28/2023  Family notified: Mariana Kaufman  Transport by: Sharin Mons  ?  Per MD patient ready for DC to [facility] . RN, patient, patient's family, [place] renal navigator,and facility notified of DC. Discharge Summary sent to facility. RN given number for report tele# [phone] RM# [id number]. DC packet on chart. Ambulance transport requested for patient.  CSW signing off.   Final next level of care: Skilled Nursing Facility Barriers to Discharge: No Barriers Identified   Patient Goals and CMS Choice CMS Medicare.gov Compare Post Acute Care list provided to:: Patient Choice offered to / list presented to : Patient  Discharge Placement                Patient chooses bed at: [facility] Patient to be transferred to facility by: [facility] Name of family member notified: Mariana Kaufman Patient and family notified of of transfer: 02/28/23  Discharge Plan and Services Additional resources added to the After Visit Summary for   In-house Referral: Clinical Social Work                                   Social Determinants of Health (SDOH) Interventions SDOH Screenings   Food Insecurity: No Food Insecurity (02/17/2023)  Housing: Low Risk  (02/17/2023)  Transportation Needs: No Transportation Needs (02/17/2023)  Utilities: Not At Risk (02/17/2023)  Tobacco Use: Medium Risk (02/17/2023)     Readmission Risk Interventions     No data to display

## 2023-02-28 NOTE — Progress Notes (Signed)
Patient transported to HD via transport at this time.  No s/s of distress or discomfort noted.

## 2023-02-28 NOTE — Procedures (Signed)
HD Note:  Some information was entered later than the data was gathered due to patient care needs. The stated time with the data is accurate.  Received patient in bed to unit.   Alert and oriented.   Informed consent signed and in chart.   Access used: Left upper arm fistula Access issues: None  Patient tolerated treatment well.   TX duration: 3.5  Alert, without acute distress.  Total UF removed: 3000 ml  Hand-off given to patient's nurse.   Transported back to the room   Emmilynn Marut L. Dareen Piano, RN Kidney Dialysis Unit.

## 2023-02-28 NOTE — Progress Notes (Signed)
Mobility Specialist Progress Note:   02/28/23 1400  Mobility  Activity Ambulated with assistance in hallway  Level of Assistance Contact guard assist, steadying assist  Assistive Device Front wheel walker  Distance Ambulated (ft) 150 ft  Activity Response Tolerated well  Mobility Referral Yes  $Mobility charge 1 Mobility  Mobility Specialist Start Time (ACUTE ONLY) 1431  Mobility Specialist Stop Time (ACUTE ONLY) 1445  Mobility Specialist Time Calculation (min) (ACUTE ONLY) 14 min    Pre Mobility: 87 HR During Mobility: 97 HR Post Mobility:  87 HR  Pt received in bed, agreeable to mobility. C/o fatigue, taking x1 standing rest break. Otherwise asymptomatic throughout and VSS. Pt left in bed with call bell and all needs met.  D'Vante Earlene Plater Mobility Specialist Please contact via Special educational needs teacher or Rehab office at [phone]

## 2023-02-28 NOTE — Progress Notes (Signed)
PT Cancellation Note  Patient Details Name: [name] MRN: [id number] DOB: Jul 07, 1950   Cancelled Treatment:    Reason Eval/Treat Not Completed: Other (comment) (Pt just walked with mobility. Will return tomorrow.)   Bevelyn Buckles 02/28/2023, 2:58 PM Xoie Kreuser M,PT Acute Rehab Services [phone]

## 2023-02-28 NOTE — Discharge Summary (Signed)
71 days.   Fish Oil 1000 MG Caps Take 1,000 mg by mouth daily.   insulin aspart 100 UNIT/ML injection Commonly known as: novoLOG Inject 0-9 Units into the skin 3 (three) times daily with meals. Sliding scale insulin Less than 70 initiate hypoglycemia protocol 70-120  0 units 120-150 1 unit 151-200 2 units 201-250 3 units 251-300 5 units 301-350 7 units 351-400 9 units Greater than 400 call MD   insulin glargine 100 UNIT/ML injection Commonly known as: LANTUS Inject 0.1 mLs (10 Units total) into the skin at bedtime. What changed:  how much to take additional instructions   latanoprost 0.005 % ophthalmic solution Commonly known as: XALATAN Place 2 drops into both eyes 2 (two) times daily.   metoprolol tartrate 25 MG tablet Commonly known as: LOPRESSOR Take 1 tablet (25 mg total) by mouth 2 (two) times daily.   ondansetron 4 MG tablet Commonly known as: ZOFRAN Take 1  tablet (4 mg total) by mouth every 6 (six) hours as needed for nausea.   OVER THE COUNTER MEDICATION Take 1 tablet by mouth daily. Beet Root   oxyCODONE 5 MG immediate release tablet Commonly known as: Oxy IR/ROXICODONE Take 1 tablet (5 mg total) by mouth every 4 (four) hours as needed for moderate pain.   polyethylene glycol 17 g packet Commonly known as: MIRALAX / GLYCOLAX Take 17 g by mouth daily. Start taking on: March 01, 2023   sevelamer carbonate 800 MG tablet Commonly known as: RENVELA Take 3 tablets (2,400 mg total) by mouth 3 (three) times daily with meals.        Contact information for follow-up providers     [facility] Follow up in 6 week(s).   Specialty: Vascular Surgery Why: Office will call you to arrange your appt (sent). Contact information: [address] [place] [address] [phone]        Felecia Shelling, DPM. Schedule an appointment as soon as possible for a visit in 2 week(s).   Specialty: Podiatry Contact information: [address] [place] [address] [phone]         Wendall Stade, MD Follow up in 1 week(s).   Specialty: Cardiology Contact information: [address] [place] [address] [phone]              Contact information for after-discharge care     Destination     [facility] .   Service: Skilled Nursing Contact information: [address] [place] [address] [phone]                    Discharge Exam: Ceasar Mons Weights   02/25/23 0755 02/25/23 1200 02/28/23 0803  Weight: 87.2 kg 84.9 kg 89.1 kg   General-appears in no acute distress Heart-S1-S2, regular, no murmur auscultated Lungs-clear to auscultation bilaterally, no wheezing or crackles auscultated Abdomen-soft, nontender, no organomegaly Extremities-no edema in the lower  extremities Neuro-alert, oriented x3, no focal deficit noted  Condition at discharge: good  The results of significant diagnostics from this hospitalization (including imaging, microbiology, ancillary and laboratory) are listed below for reference.   Imaging Studies: VAS US CAROTID  Result Date: 02/24/2023 Carotid Arterial Duplex Study Patient Name:  [name]  Date of Exam:   02/22/2023 Medical Rec #: [id number]        Accession #:    [id number] Date of Birth: January 31, 1951  71 days.   Fish Oil 1000 MG Caps Take 1,000 mg by mouth daily.   insulin aspart 100 UNIT/ML injection Commonly known as: novoLOG Inject 0-9 Units into the skin 3 (three) times daily with meals. Sliding scale insulin Less than 70 initiate hypoglycemia protocol 70-120  0 units 120-150 1 unit 151-200 2 units 201-250 3 units 251-300 5 units 301-350 7 units 351-400 9 units Greater than 400 call MD   insulin glargine 100 UNIT/ML injection Commonly known as: LANTUS Inject 0.1 mLs (10 Units total) into the skin at bedtime. What changed:  how much to take additional instructions   latanoprost 0.005 % ophthalmic solution Commonly known as: XALATAN Place 2 drops into both eyes 2 (two) times daily.   metoprolol tartrate 25 MG tablet Commonly known as: LOPRESSOR Take 1 tablet (25 mg total) by mouth 2 (two) times daily.   ondansetron 4 MG tablet Commonly known as: ZOFRAN Take 1  tablet (4 mg total) by mouth every 6 (six) hours as needed for nausea.   OVER THE COUNTER MEDICATION Take 1 tablet by mouth daily. Beet Root   oxyCODONE 5 MG immediate release tablet Commonly known as: Oxy IR/ROXICODONE Take 1 tablet (5 mg total) by mouth every 4 (four) hours as needed for moderate pain.   polyethylene glycol 17 g packet Commonly known as: MIRALAX / GLYCOLAX Take 17 g by mouth daily. Start taking on: March 01, 2023   sevelamer carbonate 800 MG tablet Commonly known as: RENVELA Take 3 tablets (2,400 mg total) by mouth 3 (three) times daily with meals.        Contact information for follow-up providers     [facility] at [facility] Follow up in 6 week(s).   Specialty: Vascular Surgery Why: Office will call you to arrange your appt (sent). Contact information: [address] [place] [address] [phone]        Felecia Shelling, DPM. Schedule an appointment as soon as possible for a visit in 2 week(s).   Specialty: Podiatry Contact information: [address] [place] [address] [phone]         Wendall Stade, MD Follow up in 1 week(s).   Specialty: Cardiology Contact information: [address] [place] [address] [phone]              Contact information for after-discharge care     Destination     [facility] .   Service: Skilled Nursing Contact information: [address] [place] [address] [phone]                    Discharge Exam: Ceasar Mons Weights   02/25/23 0755 02/25/23 1200 02/28/23 0803  Weight: 87.2 kg 84.9 kg 89.1 kg   General-appears in no acute distress Heart-S1-S2, regular, no murmur auscultated Lungs-clear to auscultation bilaterally, no wheezing or crackles auscultated Abdomen-soft, nontender, no organomegaly Extremities-no edema in the lower  extremities Neuro-alert, oriented x3, no focal deficit noted  Condition at discharge: good  The results of significant diagnostics from this hospitalization (including imaging, microbiology, ancillary and laboratory) are listed below for reference.   Imaging Studies: VAS US CAROTID  Result Date: 02/24/2023 Carotid Arterial Duplex Study Patient Name:  [name]  Date of Exam:   02/22/2023 Medical Rec #: [id number]        Accession #:    [id number] Date of Birth: January 31, 1951  71 days.   Fish Oil 1000 MG Caps Take 1,000 mg by mouth daily.   insulin aspart 100 UNIT/ML injection Commonly known as: novoLOG Inject 0-9 Units into the skin 3 (three) times daily with meals. Sliding scale insulin Less than 70 initiate hypoglycemia protocol 70-120  0 units 120-150 1 unit 151-200 2 units 201-250 3 units 251-300 5 units 301-350 7 units 351-400 9 units Greater than 400 call MD   insulin glargine 100 UNIT/ML injection Commonly known as: LANTUS Inject 0.1 mLs (10 Units total) into the skin at bedtime. What changed:  how much to take additional instructions   latanoprost 0.005 % ophthalmic solution Commonly known as: XALATAN Place 2 drops into both eyes 2 (two) times daily.   metoprolol tartrate 25 MG tablet Commonly known as: LOPRESSOR Take 1 tablet (25 mg total) by mouth 2 (two) times daily.   ondansetron 4 MG tablet Commonly known as: ZOFRAN Take 1  tablet (4 mg total) by mouth every 6 (six) hours as needed for nausea.   OVER THE COUNTER MEDICATION Take 1 tablet by mouth daily. Beet Root   oxyCODONE 5 MG immediate release tablet Commonly known as: Oxy IR/ROXICODONE Take 1 tablet (5 mg total) by mouth every 4 (four) hours as needed for moderate pain.   polyethylene glycol 17 g packet Commonly known as: MIRALAX / GLYCOLAX Take 17 g by mouth daily. Start taking on: March 01, 2023   sevelamer carbonate 800 MG tablet Commonly known as: RENVELA Take 3 tablets (2,400 mg total) by mouth 3 (three) times daily with meals.        Contact information for follow-up providers     [facility] Follow up in 6 week(s).   Specialty: Vascular Surgery Why: Office will call you to arrange your appt (sent). Contact information: [address] [place] [address] [phone]        Felecia Shelling, DPM. Schedule an appointment as soon as possible for a visit in 2 week(s).   Specialty: Podiatry Contact information: [address] [place] [address] [phone]         Wendall Stade, MD Follow up in 1 week(s).   Specialty: Cardiology Contact information: [address] [place] [address] [phone]              Contact information for after-discharge care     Destination     [facility] .   Service: Skilled Nursing Contact information: [address] [place] [address] [phone]                    Discharge Exam: Ceasar Mons Weights   02/25/23 0755 02/25/23 1200 02/28/23 0803  Weight: 87.2 kg 84.9 kg 89.1 kg   General-appears in no acute distress Heart-S1-S2, regular, no murmur auscultated Lungs-clear to auscultation bilaterally, no wheezing or crackles auscultated Abdomen-soft, nontender, no organomegaly Extremities-no edema in the lower  extremities Neuro-alert, oriented x3, no focal deficit noted  Condition at discharge: good  The results of significant diagnostics from this hospitalization (including imaging, microbiology, ancillary and laboratory) are listed below for reference.   Imaging Studies: VAS US CAROTID  Result Date: 02/24/2023 Carotid Arterial Duplex Study Patient Name:  [name]  Date of Exam:   02/22/2023 Medical Rec #: [id number]        Accession #:    [id number] Date of Birth: January 31, 1951  71 days.   Fish Oil 1000 MG Caps Take 1,000 mg by mouth daily.   insulin aspart 100 UNIT/ML injection Commonly known as: novoLOG Inject 0-9 Units into the skin 3 (three) times daily with meals. Sliding scale insulin Less than 70 initiate hypoglycemia protocol 70-120  0 units 120-150 1 unit 151-200 2 units 201-250 3 units 251-300 5 units 301-350 7 units 351-400 9 units Greater than 400 call MD   insulin glargine 100 UNIT/ML injection Commonly known as: LANTUS Inject 0.1 mLs (10 Units total) into the skin at bedtime. What changed:  how much to take additional instructions   latanoprost 0.005 % ophthalmic solution Commonly known as: XALATAN Place 2 drops into both eyes 2 (two) times daily.   metoprolol tartrate 25 MG tablet Commonly known as: LOPRESSOR Take 1 tablet (25 mg total) by mouth 2 (two) times daily.   ondansetron 4 MG tablet Commonly known as: ZOFRAN Take 1  tablet (4 mg total) by mouth every 6 (six) hours as needed for nausea.   OVER THE COUNTER MEDICATION Take 1 tablet by mouth daily. Beet Root   oxyCODONE 5 MG immediate release tablet Commonly known as: Oxy IR/ROXICODONE Take 1 tablet (5 mg total) by mouth every 4 (four) hours as needed for moderate pain.   polyethylene glycol 17 g packet Commonly known as: MIRALAX / GLYCOLAX Take 17 g by mouth daily. Start taking on: March 01, 2023   sevelamer carbonate 800 MG tablet Commonly known as: RENVELA Take 3 tablets (2,400 mg total) by mouth 3 (three) times daily with meals.        Contact information for follow-up providers     [facility] Follow up in 6 week(s).   Specialty: Vascular Surgery Why: Office will call you to arrange your appt (sent). Contact information: [address] [place] [address] [phone]        Felecia Shelling, DPM. Schedule an appointment as soon as possible for a visit in 2 week(s).   Specialty: Podiatry Contact information: [address] [place] [address] [phone]         Wendall Stade, MD Follow up in 1 week(s).   Specialty: Cardiology Contact information: [address] [place] [address] [phone]              Contact information for after-discharge care     Destination     [facility] .   Service: Skilled Nursing Contact information: [address] [place] [address] [phone]                    Discharge Exam: Ceasar Mons Weights   02/25/23 0755 02/25/23 1200 02/28/23 0803  Weight: 87.2 kg 84.9 kg 89.1 kg   General-appears in no acute distress Heart-S1-S2, regular, no murmur auscultated Lungs-clear to auscultation bilaterally, no wheezing or crackles auscultated Abdomen-soft, nontender, no organomegaly Extremities-no edema in the lower  extremities Neuro-alert, oriented x3, no focal deficit noted  Condition at discharge: good  The results of significant diagnostics from this hospitalization (including imaging, microbiology, ancillary and laboratory) are listed below for reference.   Imaging Studies: VAS US CAROTID  Result Date: 02/24/2023 Carotid Arterial Duplex Study Patient Name:  [name]  Date of Exam:   02/22/2023 Medical Rec #: [id number]        Accession #:    [id number] Date of Birth: January 31, 1951  PULMONIC VALVE AV Area (Vmax):    0.96 cm      PR End Diast Vel: 4.24 msec AV Area (Vmean):   1.02 cm AV Area (VTI):     1.27 cm AV Vmax:           303.40 cm/s AV Vmean:          206.800 cm/s AV VTI:            0.450 m AV Peak Grad:      36.8 mmHg AV Mean Grad:      20.4 mmHg LVOT Vmax:         92.50 cm/s LVOT Vmean:        67.100 cm/s LVOT VTI:          0.182 m LVOT/AV VTI ratio: 0.40  AORTA Ao Root diam: 3.70 cm Ao Asc diam:  3.60 cm MITRAL VALVE                TRICUSPID VALVE MV Area (PHT): 4.06 cm     TR Peak grad:   38.2 mmHg MV Decel Time: 187 msec     TR  Vmax:        309.00 cm/s MR Peak grad: 3.4 mmHg MR Vmax:      92.00 cm/s    SHUNTS MV E velocity: 122.00 cm/s  Systemic VTI:  0.18 m MV A velocity: 108.00 cm/s  Systemic Diam: 2.00 cm MV E/A ratio:  1.13 [place] signed by Carolan Clines Signature Date/Time: 02/21/2023/11:29:44 AM    Final    [place] EKG SITE RITE  Result Date: 02/20/2023 If Site Rite image not attached, placement could not be confirmed due to current cardiac rhythm.  VAS [place] ABI WITH/WO TBI  Result Date: 02/20/2023  LOWER EXTREMITY DOPPLER STUDY Patient Name:  [name]  Date of Exam:   02/18/2023 Medical Rec #: [id number]        Accession #:    [id number] Date of Birth: Nov 17, 1950        Patient Gender: M Patient Age:   72 years Exam Location:  [facility] Procedure:      VAS [place] ABI WITH/WO TBI Referring Phys: [facility] --------------------------------------------------------------------------------  Indications: Gangrene. High Risk Factors: Hypertension, hyperlipidemia, Diabetes, past history of                    smoking, prior MI, coronary artery disease. Other Factors: ESRD (HD), HX CABG.  Comparison Study: Previous exam (Pre-CABG) on 03/30/2015 bilateral mild resting                   ischemia Performing Technologist: Hill, Jody RVT, RDMS  Examination Guidelines: A complete evaluation includes at minimum, Doppler waveform signals and systolic blood pressure reading at the level of bilateral brachial, anterior tibial, and posterior tibial arteries, when vessel segments are accessible. Bilateral testing is considered an integral part of a complete examination. Photoelectric Plethysmograph (PPG) waveforms and toe systolic pressure readings are included as required and additional duplex testing as needed. Limited examinations for reoccurring indications may be performed as noted.  ABI Findings: +---------+------------------+-----+----------+--------+ Right    Rt Pressure (mmHg)IndexWaveform  Comment   +---------+------------------+-----+----------+--------+ Brachial 147                    triphasic          +---------+------------------+-----+----------+--------+ PTA      80  71 days.   Fish Oil 1000 MG Caps Take 1,000 mg by mouth daily.   insulin aspart 100 UNIT/ML injection Commonly known as: novoLOG Inject 0-9 Units into the skin 3 (three) times daily with meals. Sliding scale insulin Less than 70 initiate hypoglycemia protocol 70-120  0 units 120-150 1 unit 151-200 2 units 201-250 3 units 251-300 5 units 301-350 7 units 351-400 9 units Greater than 400 call MD   insulin glargine 100 UNIT/ML injection Commonly known as: LANTUS Inject 0.1 mLs (10 Units total) into the skin at bedtime. What changed:  how much to take additional instructions   latanoprost 0.005 % ophthalmic solution Commonly known as: XALATAN Place 2 drops into both eyes 2 (two) times daily.   metoprolol tartrate 25 MG tablet Commonly known as: LOPRESSOR Take 1 tablet (25 mg total) by mouth 2 (two) times daily.   ondansetron 4 MG tablet Commonly known as: ZOFRAN Take 1  tablet (4 mg total) by mouth every 6 (six) hours as needed for nausea.   OVER THE COUNTER MEDICATION Take 1 tablet by mouth daily. Beet Root   oxyCODONE 5 MG immediate release tablet Commonly known as: Oxy IR/ROXICODONE Take 1 tablet (5 mg total) by mouth every 4 (four) hours as needed for moderate pain.   polyethylene glycol 17 g packet Commonly known as: MIRALAX / GLYCOLAX Take 17 g by mouth daily. Start taking on: March 01, 2023   sevelamer carbonate 800 MG tablet Commonly known as: RENVELA Take 3 tablets (2,400 mg total) by mouth 3 (three) times daily with meals.        Contact information for follow-up providers     [facility] Follow up in 6 week(s).   Specialty: Vascular Surgery Why: Office will call you to arrange your appt (sent). Contact information: [address] [place] [address] [phone]        Felecia Shelling, DPM. Schedule an appointment as soon as possible for a visit in 2 week(s).   Specialty: Podiatry Contact information: [address] [place] [address] [phone]         Wendall Stade, MD Follow up in 1 week(s).   Specialty: Cardiology Contact information: [address] [place] [address] [phone]              Contact information for after-discharge care     Destination     [facility] .   Service: Skilled Nursing Contact information: [address] [place] [address] [phone]                    Discharge Exam: Ceasar Mons Weights   02/25/23 0755 02/25/23 1200 02/28/23 0803  Weight: 87.2 kg 84.9 kg 89.1 kg   General-appears in no acute distress Heart-S1-S2, regular, no murmur auscultated Lungs-clear to auscultation bilaterally, no wheezing or crackles auscultated Abdomen-soft, nontender, no organomegaly Extremities-no edema in the lower  extremities Neuro-alert, oriented x3, no focal deficit noted  Condition at discharge: good  The results of significant diagnostics from this hospitalization (including imaging, microbiology, ancillary and laboratory) are listed below for reference.   Imaging Studies: VAS US CAROTID  Result Date: 02/24/2023 Carotid Arterial Duplex Study Patient Name:  [name]  Date of Exam:   02/22/2023 Medical Rec #: [id number]        Accession #:    [id number] Date of Birth: January 31, 1951  PULMONIC VALVE AV Area (Vmax):    0.96 cm      PR End Diast Vel: 4.24 msec AV Area (Vmean):   1.02 cm AV Area (VTI):     1.27 cm AV Vmax:           303.40 cm/s AV Vmean:          206.800 cm/s AV VTI:            0.450 m AV Peak Grad:      36.8 mmHg AV Mean Grad:      20.4 mmHg LVOT Vmax:         92.50 cm/s LVOT Vmean:        67.100 cm/s LVOT VTI:          0.182 m LVOT/AV VTI ratio: 0.40  AORTA Ao Root diam: 3.70 cm Ao Asc diam:  3.60 cm MITRAL VALVE                TRICUSPID VALVE MV Area (PHT): 4.06 cm     TR Peak grad:   38.2 mmHg MV Decel Time: 187 msec     TR  Vmax:        309.00 cm/s MR Peak grad: 3.4 mmHg MR Vmax:      92.00 cm/s    SHUNTS MV E velocity: 122.00 cm/s  Systemic VTI:  0.18 m MV A velocity: 108.00 cm/s  Systemic Diam: 2.00 cm MV E/A ratio:  1.13 [place] signed by Carolan Clines Signature Date/Time: 02/21/2023/11:29:44 AM    Final    [place] EKG SITE RITE  Result Date: 02/20/2023 If Site Rite image not attached, placement could not be confirmed due to current cardiac rhythm.  VAS [place] ABI WITH/WO TBI  Result Date: 02/20/2023  LOWER EXTREMITY DOPPLER STUDY Patient Name:  [name]  Date of Exam:   02/18/2023 Medical Rec #: [id number]        Accession #:    [id number] Date of Birth: Nov 17, 1950        Patient Gender: M Patient Age:   72 years Exam Location:  [facility] Procedure:      VAS [place] ABI WITH/WO TBI Referring Phys: [facility] --------------------------------------------------------------------------------  Indications: Gangrene. High Risk Factors: Hypertension, hyperlipidemia, Diabetes, past history of                    smoking, prior MI, coronary artery disease. Other Factors: ESRD (HD), HX CABG.  Comparison Study: Previous exam (Pre-CABG) on 03/30/2015 bilateral mild resting                   ischemia Performing Technologist: Hill, Jody RVT, RDMS  Examination Guidelines: A complete evaluation includes at minimum, Doppler waveform signals and systolic blood pressure reading at the level of bilateral brachial, anterior tibial, and posterior tibial arteries, when vessel segments are accessible. Bilateral testing is considered an integral part of a complete examination. Photoelectric Plethysmograph (PPG) waveforms and toe systolic pressure readings are included as required and additional duplex testing as needed. Limited examinations for reoccurring indications may be performed as noted.  ABI Findings: +---------+------------------+-----+----------+--------+ Right    Rt Pressure (mmHg)IndexWaveform  Comment   +---------+------------------+-----+----------+--------+ Brachial 147                    triphasic          +---------+------------------+-----+----------+--------+ PTA      80  71 days.   Fish Oil 1000 MG Caps Take 1,000 mg by mouth daily.   insulin aspart 100 UNIT/ML injection Commonly known as: novoLOG Inject 0-9 Units into the skin 3 (three) times daily with meals. Sliding scale insulin Less than 70 initiate hypoglycemia protocol 70-120  0 units 120-150 1 unit 151-200 2 units 201-250 3 units 251-300 5 units 301-350 7 units 351-400 9 units Greater than 400 call MD   insulin glargine 100 UNIT/ML injection Commonly known as: LANTUS Inject 0.1 mLs (10 Units total) into the skin at bedtime. What changed:  how much to take additional instructions   latanoprost 0.005 % ophthalmic solution Commonly known as: XALATAN Place 2 drops into both eyes 2 (two) times daily.   metoprolol tartrate 25 MG tablet Commonly known as: LOPRESSOR Take 1 tablet (25 mg total) by mouth 2 (two) times daily.   ondansetron 4 MG tablet Commonly known as: ZOFRAN Take 1  tablet (4 mg total) by mouth every 6 (six) hours as needed for nausea.   OVER THE COUNTER MEDICATION Take 1 tablet by mouth daily. Beet Root   oxyCODONE 5 MG immediate release tablet Commonly known as: Oxy IR/ROXICODONE Take 1 tablet (5 mg total) by mouth every 4 (four) hours as needed for moderate pain.   polyethylene glycol 17 g packet Commonly known as: MIRALAX / GLYCOLAX Take 17 g by mouth daily. Start taking on: March 01, 2023   sevelamer carbonate 800 MG tablet Commonly known as: RENVELA Take 3 tablets (2,400 mg total) by mouth 3 (three) times daily with meals.        Contact information for follow-up providers     [facility] Follow up in 6 week(s).   Specialty: Vascular Surgery Why: Office will call you to arrange your appt (sent). Contact information: [address] [place] [address] [phone]        Felecia Shelling, DPM. Schedule an appointment as soon as possible for a visit in 2 week(s).   Specialty: Podiatry Contact information: [address] [place] [address] [phone]         Wendall Stade, MD Follow up in 1 week(s).   Specialty: Cardiology Contact information: [address] [place] [address] [phone]              Contact information for after-discharge care     Destination     [facility] .   Service: Skilled Nursing Contact information: [address] [place] [address] [phone]                    Discharge Exam: Ceasar Mons Weights   02/25/23 0755 02/25/23 1200 02/28/23 0803  Weight: 87.2 kg 84.9 kg 89.1 kg   General-appears in no acute distress Heart-S1-S2, regular, no murmur auscultated Lungs-clear to auscultation bilaterally, no wheezing or crackles auscultated Abdomen-soft, nontender, no organomegaly Extremities-no edema in the lower  extremities Neuro-alert, oriented x3, no focal deficit noted  Condition at discharge: good  The results of significant diagnostics from this hospitalization (including imaging, microbiology, ancillary and laboratory) are listed below for reference.   Imaging Studies: VAS US CAROTID  Result Date: 02/24/2023 Carotid Arterial Duplex Study Patient Name:  [name]  Date of Exam:   02/22/2023 Medical Rec #: [id number]        Accession #:    [id number] Date of Birth: January 31, 1951  PULMONIC VALVE AV Area (Vmax):    0.96 cm      PR End Diast Vel: 4.24 msec AV Area (Vmean):   1.02 cm AV Area (VTI):     1.27 cm AV Vmax:           303.40 cm/s AV Vmean:          206.800 cm/s AV VTI:            0.450 m AV Peak Grad:      36.8 mmHg AV Mean Grad:      20.4 mmHg LVOT Vmax:         92.50 cm/s LVOT Vmean:        67.100 cm/s LVOT VTI:          0.182 m LVOT/AV VTI ratio: 0.40  AORTA Ao Root diam: 3.70 cm Ao Asc diam:  3.60 cm MITRAL VALVE                TRICUSPID VALVE MV Area (PHT): 4.06 cm     TR Peak grad:   38.2 mmHg MV Decel Time: 187 msec     TR  Vmax:        309.00 cm/s MR Peak grad: 3.4 mmHg MR Vmax:      92.00 cm/s    SHUNTS MV E velocity: 122.00 cm/s  Systemic VTI:  0.18 m MV A velocity: 108.00 cm/s  Systemic Diam: 2.00 cm MV E/A ratio:  1.13 [place] signed by Carolan Clines Signature Date/Time: 02/21/2023/11:29:44 AM    Final    [place] EKG SITE RITE  Result Date: 02/20/2023 If Site Rite image not attached, placement could not be confirmed due to current cardiac rhythm.  VAS [place] ABI WITH/WO TBI  Result Date: 02/20/2023  LOWER EXTREMITY DOPPLER STUDY Patient Name:  [name]  Date of Exam:   02/18/2023 Medical Rec #: [id number]        Accession #:    [id number] Date of Birth: Nov 17, 1950        Patient Gender: M Patient Age:   72 years Exam Location:  [facility] Procedure:      VAS [place] ABI WITH/WO TBI Referring Phys: [facility] --------------------------------------------------------------------------------  Indications: Gangrene. High Risk Factors: Hypertension, hyperlipidemia, Diabetes, past history of                    smoking, prior MI, coronary artery disease. Other Factors: ESRD (HD), HX CABG.  Comparison Study: Previous exam (Pre-CABG) on 03/30/2015 bilateral mild resting                   ischemia Performing Technologist: Hill, Jody RVT, RDMS  Examination Guidelines: A complete evaluation includes at minimum, Doppler waveform signals and systolic blood pressure reading at the level of bilateral brachial, anterior tibial, and posterior tibial arteries, when vessel segments are accessible. Bilateral testing is considered an integral part of a complete examination. Photoelectric Plethysmograph (PPG) waveforms and toe systolic pressure readings are included as required and additional duplex testing as needed. Limited examinations for reoccurring indications may be performed as noted.  ABI Findings: +---------+------------------+-----+----------+--------+ Right    Rt Pressure (mmHg)IndexWaveform  Comment   +---------+------------------+-----+----------+--------+ Brachial 147                    triphasic          +---------+------------------+-----+----------+--------+ PTA      80  PULMONIC VALVE AV Area (Vmax):    0.96 cm      PR End Diast Vel: 4.24 msec AV Area (Vmean):   1.02 cm AV Area (VTI):     1.27 cm AV Vmax:           303.40 cm/s AV Vmean:          206.800 cm/s AV VTI:            0.450 m AV Peak Grad:      36.8 mmHg AV Mean Grad:      20.4 mmHg LVOT Vmax:         92.50 cm/s LVOT Vmean:        67.100 cm/s LVOT VTI:          0.182 m LVOT/AV VTI ratio: 0.40  AORTA Ao Root diam: 3.70 cm Ao Asc diam:  3.60 cm MITRAL VALVE                TRICUSPID VALVE MV Area (PHT): 4.06 cm     TR Peak grad:   38.2 mmHg MV Decel Time: 187 msec     TR  Vmax:        309.00 cm/s MR Peak grad: 3.4 mmHg MR Vmax:      92.00 cm/s    SHUNTS MV E velocity: 122.00 cm/s  Systemic VTI:  0.18 m MV A velocity: 108.00 cm/s  Systemic Diam: 2.00 cm MV E/A ratio:  1.13 [place] signed by Carolan Clines Signature Date/Time: 02/21/2023/11:29:44 AM    Final    [place] EKG SITE RITE  Result Date: 02/20/2023 If Site Rite image not attached, placement could not be confirmed due to current cardiac rhythm.  VAS [place] ABI WITH/WO TBI  Result Date: 02/20/2023  LOWER EXTREMITY DOPPLER STUDY Patient Name:  [name]  Date of Exam:   02/18/2023 Medical Rec #: [id number]        Accession #:    [id number] Date of Birth: Nov 17, 1950        Patient Gender: M Patient Age:   72 years Exam Location:  [facility] Procedure:      VAS [place] ABI WITH/WO TBI Referring Phys: [facility] --------------------------------------------------------------------------------  Indications: Gangrene. High Risk Factors: Hypertension, hyperlipidemia, Diabetes, past history of                    smoking, prior MI, coronary artery disease. Other Factors: ESRD (HD), HX CABG.  Comparison Study: Previous exam (Pre-CABG) on 03/30/2015 bilateral mild resting                   ischemia Performing Technologist: Hill, Jody RVT, RDMS  Examination Guidelines: A complete evaluation includes at minimum, Doppler waveform signals and systolic blood pressure reading at the level of bilateral brachial, anterior tibial, and posterior tibial arteries, when vessel segments are accessible. Bilateral testing is considered an integral part of a complete examination. Photoelectric Plethysmograph (PPG) waveforms and toe systolic pressure readings are included as required and additional duplex testing as needed. Limited examinations for reoccurring indications may be performed as noted.  ABI Findings: +---------+------------------+-----+----------+--------+ Right    Rt Pressure (mmHg)IndexWaveform  Comment   +---------+------------------+-----+----------+--------+ Brachial 147                    triphasic          +---------+------------------+-----+----------+--------+ PTA      80  71 days.   Fish Oil 1000 MG Caps Take 1,000 mg by mouth daily.   insulin aspart 100 UNIT/ML injection Commonly known as: novoLOG Inject 0-9 Units into the skin 3 (three) times daily with meals. Sliding scale insulin Less than 70 initiate hypoglycemia protocol 70-120  0 units 120-150 1 unit 151-200 2 units 201-250 3 units 251-300 5 units 301-350 7 units 351-400 9 units Greater than 400 call MD   insulin glargine 100 UNIT/ML injection Commonly known as: LANTUS Inject 0.1 mLs (10 Units total) into the skin at bedtime. What changed:  how much to take additional instructions   latanoprost 0.005 % ophthalmic solution Commonly known as: XALATAN Place 2 drops into both eyes 2 (two) times daily.   metoprolol tartrate 25 MG tablet Commonly known as: LOPRESSOR Take 1 tablet (25 mg total) by mouth 2 (two) times daily.   ondansetron 4 MG tablet Commonly known as: ZOFRAN Take 1  tablet (4 mg total) by mouth every 6 (six) hours as needed for nausea.   OVER THE COUNTER MEDICATION Take 1 tablet by mouth daily. Beet Root   oxyCODONE 5 MG immediate release tablet Commonly known as: Oxy IR/ROXICODONE Take 1 tablet (5 mg total) by mouth every 4 (four) hours as needed for moderate pain.   polyethylene glycol 17 g packet Commonly known as: MIRALAX / GLYCOLAX Take 17 g by mouth daily. Start taking on: March 01, 2023   sevelamer carbonate 800 MG tablet Commonly known as: RENVELA Take 3 tablets (2,400 mg total) by mouth 3 (three) times daily with meals.        Contact information for follow-up providers     [facility] Follow up in 6 week(s).   Specialty: Vascular Surgery Why: Office will call you to arrange your appt (sent). Contact information: [address] [place] [address] [phone]        Felecia Shelling, DPM. Schedule an appointment as soon as possible for a visit in 2 week(s).   Specialty: Podiatry Contact information: [address] [place] [place] [address] [phone]         Wendall Stade, MD Follow up in 1 week(s).   Specialty: Cardiology Contact information: [address] [place] [address] [phone]              Contact information for after-discharge care     Destination     [facility] .   Service: Skilled Nursing Contact information: [address] [place] [address] [phone]                    Discharge Exam: Ceasar Mons Weights   02/25/23 0755 02/25/23 1200 02/28/23 0803  Weight: 87.2 kg 84.9 kg 89.1 kg   General-appears in no acute distress Heart-S1-S2, regular, no murmur auscultated Lungs-clear to auscultation bilaterally, no wheezing or crackles auscultated Abdomen-soft, nontender, no organomegaly Extremities-no edema in the lower  extremities Neuro-alert, oriented x3, no focal deficit noted  Condition at discharge: good  The results of significant diagnostics from this hospitalization (including imaging, microbiology, ancillary and laboratory) are listed below for reference.   Imaging Studies: VAS US CAROTID  Result Date: 02/24/2023 Carotid Arterial Duplex Study Patient Name:  [name]  Date of Exam:   02/22/2023 Medical Rec #: [id number]        Accession #:    [id number] Date of Birth: January 31, 1951  lesions even with through and through access via the posterior tibial artery.  Ultimately I was able to treat the superficial femoral and above-knee popliteal artery with a 1.5 laser catheter.  I have ballooned the superficial femoral and popliteal artery with a 4 mm balloon and the tibioperoneal trunk and posterior tibial with a 3 mm balloon.  The patient now has inline flow via the posterior tibial artery down to the foot  #2  Diffuse disease out onto the foot.  #3  Patient has been maximally revascularized for now.  He may require additional interventions in the future for recurrent stenosis, given that he was unable to be stented today V. [name], M.D., FACS [facility] Office: [phone] Pager:  [phone]   DG Chest Port 1 View  Result Date: 02/22/2023 CLINICAL DATA:  Shortness of breath EXAM: PORTABLE CHEST 1 VIEW COMPARISON:  CXR 02/20/23 FINDINGS: Status post median sternotomy and CABG. Cardiomegaly.  Moderate right-sided pleural effusion, unchanged. No pneumothorax. Hazy opacity in the right base could represent atelectasis. No radiographically apparent displaced rib fractures. Visualized upper abdomen is unremarkable. IMPRESSION: 1. Moderate right-sided pleural effusion, unchanged. 2. Hazy opacity in the right base most likely represents atelectasis. Electronically Signed   By: Lorenza Cambridge M.D.   On: 02/22/2023 08:11   ECHOCARDIOGRAM COMPLETE  Result Date: 02/21/2023    ECHOCARDIOGRAM REPORT   Patient Name:   [name] Date of Exam: 02/21/2023 Medical Rec #:  [id number]       Height:       77.0 in Accession #:    [id number]      Weight:       180.1 lb Date of Birth:  1951/02/13       BSA:          2.139 m Patient Age:    72 years        BP:           102/68 mmHg Patient Gender: M               HR:           80 bpm. Exam Location:  Inpatient Procedure: 2D Echo, Cardiac Doppler and Color Doppler Indications:    Chest Pain R07.9  History:        Patient has prior history of Echocardiogram examinations, most                 recent 04/08/2022. Previous Myocardial Infarction, Prior CABG,                 Mitral Valve Disease, Signs/Symptoms:Chest Pain; Risk                 Factors:Hypertension, Diabetes, Dyslipidemia and Non-Smoker.  Sonographer:    Aron Baba Referring Phys: Heide Scales [facility]  Sonographer Comments: Suboptimal apical window. Image acquisition challenging due to respiratory motion. IMPRESSIONS  1. Left ventricular ejection fraction, by estimation, is 60 to 65%. The left ventricle has normal function. The left ventricle has no regional wall motion abnormalities. There is mild left ventricular hypertrophy. Left ventricular diastolic parameters are consistent with Grade II diastolic dysfunction (pseudonormalization). There is the interventricular septum is flattened in diastole ('D' shaped left ventricle), consistent with right ventricular volume overload.  2. Right ventricular systolic  function is severely reduced. The right ventricular size is severely enlarged. There is moderately elevated pulmonary artery systolic pressure. The estimated right ventricular systolic pressure is 53.2 mmHg.  3. No evidence  PULMONIC VALVE AV Area (Vmax):    0.96 cm      PR End Diast Vel: 4.24 msec AV Area (Vmean):   1.02 cm AV Area (VTI):     1.27 cm AV Vmax:           303.40 cm/s AV Vmean:          206.800 cm/s AV VTI:            0.450 m AV Peak Grad:      36.8 mmHg AV Mean Grad:      20.4 mmHg LVOT Vmax:         92.50 cm/s LVOT Vmean:        67.100 cm/s LVOT VTI:          0.182 m LVOT/AV VTI ratio: 0.40  AORTA Ao Root diam: 3.70 cm Ao Asc diam:  3.60 cm MITRAL VALVE                TRICUSPID VALVE MV Area (PHT): 4.06 cm     TR Peak grad:   38.2 mmHg MV Decel Time: 187 msec     TR  Vmax:        309.00 cm/s MR Peak grad: 3.4 mmHg MR Vmax:      92.00 cm/s    SHUNTS MV E velocity: 122.00 cm/s  Systemic VTI:  0.18 m MV A velocity: 108.00 cm/s  Systemic Diam: 2.00 cm MV E/A ratio:  1.13 [place] signed by Carolan Clines Signature Date/Time: 02/21/2023/11:29:44 AM    Final    [place] EKG SITE RITE  Result Date: 02/20/2023 If Site Rite image not attached, placement could not be confirmed due to current cardiac rhythm.  VAS [place] ABI WITH/WO TBI  Result Date: 02/20/2023  LOWER EXTREMITY DOPPLER STUDY Patient Name:  [name]  Date of Exam:   02/18/2023 Medical Rec #: [id number]        Accession #:    [id number] Date of Birth: Nov 17, 1950        Patient Gender: M Patient Age:   72 years Exam Location:  [facility] Procedure:      VAS [place] ABI WITH/WO TBI Referring Phys: [facility] --------------------------------------------------------------------------------  Indications: Gangrene. High Risk Factors: Hypertension, hyperlipidemia, Diabetes, past history of                    smoking, prior MI, coronary artery disease. Other Factors: ESRD (HD), HX CABG.  Comparison Study: Previous exam (Pre-CABG) on 03/30/2015 bilateral mild resting                   ischemia Performing Technologist: Hill, Jody RVT, RDMS  Examination Guidelines: A complete evaluation includes at minimum, Doppler waveform signals and systolic blood pressure reading at the level of bilateral brachial, anterior tibial, and posterior tibial arteries, when vessel segments are accessible. Bilateral testing is considered an integral part of a complete examination. Photoelectric Plethysmograph (PPG) waveforms and toe systolic pressure readings are included as required and additional duplex testing as needed. Limited examinations for reoccurring indications may be performed as noted.  ABI Findings: +---------+------------------+-----+----------+--------+ Right    Rt Pressure (mmHg)IndexWaveform  Comment   +---------+------------------+-----+----------+--------+ Brachial 147                    triphasic          +---------+------------------+-----+----------+--------+ PTA      80

## 2023-02-28 NOTE — Plan of Care (Signed)
Problem: Education: Goal: Knowledge of General Education information will improve Description: Including pain rating scale, medication(s)/side effects and non-pharmacologic comfort measures Outcome: Adequate for Discharge   Problem: Health Behavior/Discharge Planning: Goal: Ability to manage health-related needs will improve Outcome: Adequate for Discharge   Problem: Clinical Measurements: Goal: Ability to maintain clinical measurements within normal limits will improve Outcome: Adequate for Discharge Goal: Will remain free from infection Outcome: Adequate for Discharge Goal: Diagnostic test results will improve Outcome: Adequate for Discharge Goal: Respiratory complications will improve Outcome: Adequate for Discharge Goal: Cardiovascular complication will be avoided Outcome: Adequate for Discharge   Problem: Activity: Goal: Risk for activity intolerance will decrease Outcome: Adequate for Discharge   Problem: Nutrition: Goal: Adequate nutrition will be maintained Outcome: Adequate for Discharge   Problem: Coping: Goal: Level of anxiety will decrease Outcome: Adequate for Discharge   Problem: Elimination: Goal: Will not experience complications related to bowel motility Outcome: Adequate for Discharge Goal: Will not experience complications related to urinary retention Outcome: Adequate for Discharge   Problem: Pain Managment: Goal: General experience of comfort will improve Outcome: Adequate for Discharge   Problem: Safety: Goal: Ability to remain free from injury will improve Outcome: Adequate for Discharge   Problem: Skin Integrity: Goal: Risk for impaired skin integrity will decrease Outcome: Adequate for Discharge   Problem: Education: Goal: Ability to describe self-care measures that may prevent or decrease complications (Diabetes Survival Skills Education) will improve Outcome: Adequate for Discharge Goal: Individualized Educational Video(s) Outcome:  Adequate for Discharge   Problem: Coping: Goal: Ability to adjust to condition or change in health will improve Outcome: Adequate for Discharge   Problem: Fluid Volume: Goal: Ability to maintain a balanced intake and output will improve Outcome: Adequate for Discharge   Problem: Health Behavior/Discharge Planning: Goal: Ability to identify and utilize available resources and services will improve Outcome: Adequate for Discharge Goal: Ability to manage health-related needs will improve Outcome: Adequate for Discharge   Problem: Metabolic: Goal: Ability to maintain appropriate glucose levels will improve Outcome: Adequate for Discharge   Problem: Nutritional: Goal: Maintenance of adequate nutrition will improve Outcome: Adequate for Discharge Goal: Progress toward achieving an optimal weight will improve Outcome: Adequate for Discharge   Problem: Skin Integrity: Goal: Risk for impaired skin integrity will decrease Outcome: Adequate for Discharge   Problem: Tissue Perfusion: Goal: Adequacy of tissue perfusion will improve Outcome: Adequate for Discharge   Problem: Education: Goal: Understanding of CV disease, CV risk reduction, and recovery process will improve Outcome: Adequate for Discharge Goal: Individualized Educational Video(s) Outcome: Adequate for Discharge   Problem: Activity: Goal: Ability to return to baseline activity level will improve Outcome: Adequate for Discharge   Problem: Cardiovascular: Goal: Ability to achieve and maintain adequate cardiovascular perfusion will improve Outcome: Adequate for Discharge Goal: Vascular access site(s) Level 0-1 will be maintained Outcome: Adequate for Discharge   Problem: Health Behavior/Discharge Planning: Goal: Ability to safely manage health-related needs after discharge will improve Outcome: Adequate for Discharge

## 2023-02-28 NOTE — Procedures (Signed)
I was present at this dialysis session. I have reviewed the session itself and made appropriate changes.   AM labs pending. Starting on 2K bath. UF goal 3.5L.  No c/or or needs.    Filed Weights   02/25/23 0755 02/25/23 1200 02/28/23 0803  Weight: 87.2 kg 84.9 kg 89.1 kg    Recent Labs  Lab 02/25/23 0840  NA 126*  K 4.9  CL 86*  CO2 23  GLUCOSE 230*  BUN 55*  CREATININE 8.48*  CALCIUM 8.7*  PHOS 8.2*    Recent Labs  Lab 02/22/23 0320 02/23/23 0340 02/24/23 0539 02/25/23 0652  WBC 15.2* 18.0* 17.1* 19.7*  NEUTROABS 13.3* 15.9* 15.3*  --   HGB 10.7* 11.7* 10.9* 10.1*  HCT 34.1* 37.6* 34.3* 32.4*  MCV 91.2 95.9 95.0 95.3  PLT 272 299 298 293    Scheduled Meds:  (feeding supplement) PROSource Plus  30 mL Oral BID BM   amitriptyline  25 mg Oral QHS   amoxicillin-clavulanate  1 tablet Oral Q24H   aspirin EC  81 mg Oral Daily   atorvastatin  80 mg Oral q1800   brimonidine  1 drop Both Eyes q AM   clopidogrel  75 mg Oral Q breakfast   doxycycline  100 mg Oral Q12H   heparin  2,000 Units Dialysis Once in dialysis   influenza vaccine adjuvanted  0.5 mL Intramuscular Tomorrow-1000   insulin aspart  0-15 Units Subcutaneous TID WC   latanoprost  2 drop Both Eyes BID   loperamide  4 mg Oral Once   metoprolol tartrate  25 mg Oral BID   polyethylene glycol  17 g Oral Daily   sevelamer carbonate  2,400 mg Oral TID WC   Continuous Infusions: PRN Meds:.acetaminophen, hydrALAZINE, melatonin, ondansetron **OR** [DISCONTINUED] ondansetron (ZOFRAN) IV, mouth rinse, oxyCODONE, pentafluoroprop-tetrafluoroeth, prochlorperazine   Sabra Heck  MD 02/28/2023, 8:27 AM

## 2023-02-28 NOTE — TOC Progression Note (Addendum)
Transition of Care [facility]) - Progression Note    Patient Details  Name: [name] MRN: [id number] Date of Birth: 12-21-1950  [facility]) CM/SW Contact  Delilah Shan, LCSWA Phone Number: 02/28/2023, 1:54 PM  Clinical Narrative:     Update- MD informed CSW that Peer to Peer was complete and was approved. CSW called patients insurance to obtain approval details. CSW spoke with patients insurance. Insurance confirmed peer to peer was approved 9/12-9/16. Reference number # [id number]. Plan Auth ID# [id number]. Insurance informed CSW patients insurance authorization is good through 9/17 as long as patient admits to facility by 11:59pm. CSW informed MD.  CSW called to check on patients insurance authorization. Patients insurance informed CSW that patients insurance went to a peer to peer and deadline was by 12:00pm [place] standard time today. CSW informed MD. Insurance informed CSW to inform MD he can still call and complete peer to peer. Telephone number [phone] option 5.CSW informed MD. CSW awaiting peer to peer determination for SNF. CSW will continue to follow.   Expected Discharge Plan: Skilled Nursing Facility Barriers to Discharge: Continued Medical Work up  Expected Discharge Plan and Services In-house Referral: Clinical Social Work     Living arrangements for the past 2 months: Single Family Home                                       Social Determinants of Health (SDOH) Interventions SDOH Screenings   Food Insecurity: No Food Insecurity (02/17/2023)  Housing: Low Risk  (02/17/2023)  Transportation Needs: No Transportation Needs (02/17/2023)  Utilities: Not At Risk (02/17/2023)  Tobacco Use: Medium Risk (02/17/2023)    Readmission Risk Interventions     No data to display

## 2023-02-28 NOTE — Progress Notes (Signed)
PT Cancellation Note  Patient Details Name: Bric Rardin MRN: [id number] DOB: 03/23/1951   Cancelled Treatment:    Reason Eval/Treat Not Completed: Patient at procedure or test/unavailable (Pt in HD.  Will return as able.)   Bevelyn Buckles 02/28/2023, 8:51 AM Kinzi Frediani M,PT Acute Rehab Services [phone]

## 2023-03-01 ENCOUNTER — Telehealth (HOSPITAL_COMMUNITY): Payer: Self-pay | Admitting: *Deleted

## 2023-03-01 NOTE — Telephone Encounter (Signed)
Spoke with Pt's daughter in law, Christel Denmark per DPR and gave instructions for MPI.

## 2023-03-02 ENCOUNTER — Telehealth: Payer: Self-pay

## 2023-03-02 DIAGNOSIS — D509 Iron deficiency anemia, unspecified: Secondary | ICD-10-CM | POA: Diagnosis not present

## 2023-03-02 DIAGNOSIS — D689 Coagulation defect, unspecified: Secondary | ICD-10-CM | POA: Diagnosis not present

## 2023-03-02 DIAGNOSIS — N186 End stage renal disease: Secondary | ICD-10-CM | POA: Diagnosis not present

## 2023-03-02 DIAGNOSIS — D696 Thrombocytopenia, unspecified: Secondary | ICD-10-CM | POA: Diagnosis not present

## 2023-03-02 DIAGNOSIS — N2581 Secondary hyperparathyroidism of renal origin: Secondary | ICD-10-CM | POA: Diagnosis not present

## 2023-03-02 DIAGNOSIS — Z992 Dependence on renal dialysis: Secondary | ICD-10-CM | POA: Diagnosis not present

## 2023-03-02 NOTE — Telephone Encounter (Signed)
Received two messages from physical therapy - They are asking if there are any ambulatory restrictions for this patient - should his PT be WB or NWB?  Second call came from [place] [phone]

## 2023-03-03 ENCOUNTER — Ambulatory Visit (HOSPITAL_BASED_OUTPATIENT_CLINIC_OR_DEPARTMENT_OTHER): Payer: Medicare Other

## 2023-03-03 DIAGNOSIS — R072 Precordial pain: Secondary | ICD-10-CM | POA: Diagnosis not present

## 2023-03-03 LAB — MYOCARDIAL PERFUSION IMAGING
LV dias vol: 113 mL (ref 62–150)
LV sys vol: 48 mL
Nuc Stress EF: 58 %
Peak HR: 95 {beats}/min
Rest HR: 85 {beats}/min
Rest Nuclear Isotope Dose: 9 mCi
SDS: 3
SRS: 2
SSS: 5
ST Depression (mm): 0 mm
Stress Nuclear Isotope Dose: 31.2 mCi
TID: 1.05

## 2023-03-03 MED ORDER — TECHNETIUM TC 99M TETROFOSMIN IV KIT
9.0000 | PACK | Freq: Once | INTRAVENOUS | Status: AC | PRN
  Administered 2023-03-03: 9 via INTRAVENOUS

## 2023-03-03 MED ORDER — TECHNETIUM TC 99M TETROFOSMIN IV KIT
31.2000 | PACK | Freq: Once | INTRAVENOUS | Status: AC | PRN
  Administered 2023-03-03: 31.2 via INTRAVENOUS

## 2023-03-03 MED ORDER — REGADENOSON 0.4 MG/5ML IV SOLN
0.4000 mg | Freq: Once | INTRAVENOUS | Status: AC
  Administered 2023-03-03: 0.4 mg via INTRAVENOUS

## 2023-03-04 DIAGNOSIS — N186 End stage renal disease: Secondary | ICD-10-CM | POA: Diagnosis not present

## 2023-03-04 DIAGNOSIS — D689 Coagulation defect, unspecified: Secondary | ICD-10-CM | POA: Diagnosis not present

## 2023-03-04 DIAGNOSIS — N2581 Secondary hyperparathyroidism of renal origin: Secondary | ICD-10-CM | POA: Diagnosis not present

## 2023-03-04 DIAGNOSIS — Z992 Dependence on renal dialysis: Secondary | ICD-10-CM | POA: Diagnosis not present

## 2023-03-04 DIAGNOSIS — D509 Iron deficiency anemia, unspecified: Secondary | ICD-10-CM | POA: Diagnosis not present

## 2023-03-04 DIAGNOSIS — D696 Thrombocytopenia, unspecified: Secondary | ICD-10-CM | POA: Diagnosis not present

## 2023-03-04 NOTE — Telephone Encounter (Signed)
Spoke with Tammy .  This patient is currently not being followed with [place].  Haven't seen this patient since February 2024 and that was only for routine footcare.  Informed her of this.  Thanks, Dr. Logan Bores

## 2023-03-07 DIAGNOSIS — D689 Coagulation defect, unspecified: Secondary | ICD-10-CM | POA: Diagnosis not present

## 2023-03-07 DIAGNOSIS — D509 Iron deficiency anemia, unspecified: Secondary | ICD-10-CM | POA: Diagnosis not present

## 2023-03-07 DIAGNOSIS — D696 Thrombocytopenia, unspecified: Secondary | ICD-10-CM | POA: Diagnosis not present

## 2023-03-07 DIAGNOSIS — Z992 Dependence on renal dialysis: Secondary | ICD-10-CM | POA: Diagnosis not present

## 2023-03-07 DIAGNOSIS — N2581 Secondary hyperparathyroidism of renal origin: Secondary | ICD-10-CM | POA: Diagnosis not present

## 2023-03-07 DIAGNOSIS — N186 End stage renal disease: Secondary | ICD-10-CM | POA: Diagnosis not present

## 2023-03-09 DIAGNOSIS — D509 Iron deficiency anemia, unspecified: Secondary | ICD-10-CM | POA: Diagnosis not present

## 2023-03-09 DIAGNOSIS — N186 End stage renal disease: Secondary | ICD-10-CM | POA: Diagnosis not present

## 2023-03-09 DIAGNOSIS — D689 Coagulation defect, unspecified: Secondary | ICD-10-CM | POA: Diagnosis not present

## 2023-03-09 DIAGNOSIS — N2581 Secondary hyperparathyroidism of renal origin: Secondary | ICD-10-CM | POA: Diagnosis not present

## 2023-03-09 DIAGNOSIS — Z992 Dependence on renal dialysis: Secondary | ICD-10-CM | POA: Diagnosis not present

## 2023-03-09 DIAGNOSIS — D696 Thrombocytopenia, unspecified: Secondary | ICD-10-CM | POA: Diagnosis not present

## 2023-03-09 NOTE — Progress Notes (Unsigned)
Cardiology Office Note:  .   Date:  03/10/2023  ID:  Charlton Haws, DOB Jan 16, 1951, MRN [id number] PCP: [facility]  [facility] Providers Cardiologist:  Charlton Haws, MD {    History of Present Illness: [name]   Trajon Mathes is a 72 y.o. male with a past medical history of ESRD on HD, CAD status post CABG x 4 (LIMA to LAD, SVG to diagonal, SVG to OM1, and SVG to RCA) 03/2015 at [facility] in setting of NSTEMI/CHF, MI in 2003 status post PCI, HTN, DM2, HLD who is here for follow-up visit.  He was last seen by [facility]  10/21/2022 and at that time he was taken off most of his prior antihypertensive due to dizziness lightheadedness.  Blood pressure was around 150s to 160s systolic.  Hypotensive during regular dialysis sessions down to 80s to 90 systolic associated with presyncopal symptoms.  Is taking aspirin intermittently and regularly missing doses.  Had not taken a statin several months.  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  He sees foot and ankle next week and they will discuss timing of surgery.  No chest pain or shortness of breath today.  Recently underwent a stress test on 9/19 which was low risk for ischemia.  No palpitations.  He does have some swelling in his left leg but that is the affected side with the gangrene.  He has lost some weight and his blood pressure has been low normal recently.  The only blood pressure pill he is on is Lopressor twice a day.  We have decreased this to 12-1/2 twice daily and put holding parameters on it for hypotension.  He does physical therapy every day.  Does not think he can walk 1-2 blocks.    Once the official clearance request has been sent we can weigh in on cardiovascular risk for his upcoming surgery.  ROS: Pertinent ROS in HPI  Studies Reviewed: [name]       Eugenie Birks Myoview 03/03/2023  Findings are consistent with no ischemia. The study is low risk.   No ST  deviation was noted.   LV perfusion is abnormal. There is no evidence of ischemia. Defect 1: There is a small defect with moderate reduction in uptake present in the mid to basal inferior location(s) that is fixed. There is normal wall motion in the defect area. Consistent with artifact caused by diaphragmatic attenuation.   Left ventricular function is normal. Nuclear stress EF: 58%. The left ventricular ejection fraction is normal (55-65%). End diastolic cavity size is normal. End systolic cavity size is normal.   Prior study not available for comparison.     Physical Exam:   VS:  BP (!) 108/56   Pulse 93   Ht 6\' 4"  (1.93 m)   Wt 184 lb (83.5 kg)   SpO2 94%   BMI 22.40 kg/m    Wt Readings from Last 3 Encounters:  03/10/23 184 lb (83.5 kg)  03/03/23 196 lb (88.9 kg)  02/28/23 196 lb 6.9 oz (89.1 kg)    GEN: Well nourished, well developed in no acute distress NECK: No JVD; No carotid bruits CARDIAC: RRR, + systolic murmurs, rubs, gallops RESPIRATORY:  Clear to auscultation without rales, wheezing or rhonchi  ABDOMEN: Soft, non-tender, non-distended EXTREMITIES:  No edema; No deformity   ASSESSMENT AND PLAN: .   1.  CAD status post CABG x 4 -No chest pain,  shortness of breath -Continue current medication regimen which includes aspirin 81 mg daily, Lipitor 80 daily, Plavix 75 g daily, Lopressor 12.5 mg twice a day (recently reduced), fish oil 1000 mg daily  2. ESRD on HD -Blood pressure has been getting low with dialysis and he was told close metoprolol needs days  3. HLD -LDL 38 -Would continue current medication regimen with Lipitor 80 mg daily  4. HTN -Blood pressure low normal today, 108/56 -Patient has had very little lightheadedness/dizziness -We have decreased his Lopressor down to half a pill twice a day and asked him to hold medication on dialysis days  5. Aortic stenosis -Most recent echocardiogram reviewed which showed mild to moderate aortic stenosis with heavy  calcification around the annulus -Currently no symptoms of syncope or near syncope -I will continue to monitor with annual echocardiograms    Dispo: He can follow-up in 3-4 months.  Signed, Sharlene Dory, PA-C

## 2023-03-10 ENCOUNTER — Ambulatory Visit: Payer: Medicare Other | Attending: Physician Assistant | Admitting: Physician Assistant

## 2023-03-10 ENCOUNTER — Encounter: Payer: Self-pay | Admitting: Physician Assistant

## 2023-03-10 VITALS — BP 108/56 | HR 93 | Ht 76.0 in | Wt 184.0 lb

## 2023-03-10 DIAGNOSIS — N186 End stage renal disease: Secondary | ICD-10-CM | POA: Diagnosis not present

## 2023-03-10 DIAGNOSIS — Z951 Presence of aortocoronary bypass graft: Secondary | ICD-10-CM

## 2023-03-10 DIAGNOSIS — I10 Essential (primary) hypertension: Secondary | ICD-10-CM

## 2023-03-10 DIAGNOSIS — I2510 Atherosclerotic heart disease of native coronary artery without angina pectoris: Secondary | ICD-10-CM

## 2023-03-10 DIAGNOSIS — I350 Nonrheumatic aortic (valve) stenosis: Secondary | ICD-10-CM | POA: Diagnosis not present

## 2023-03-10 DIAGNOSIS — E785 Hyperlipidemia, unspecified: Secondary | ICD-10-CM | POA: Diagnosis not present

## 2023-03-10 MED ORDER — METOPROLOL TARTRATE 25 MG PO TABS: 12.5000 mg | ORAL_TABLET | Freq: Two times a day (BID) | ORAL | Status: DC

## 2023-03-10 NOTE — Patient Instructions (Signed)
Medication Instructions:  Decrease metoprolol tartrate to 12.5 mg twice daily, hold of SBP is less than 110 *If you need a refill on your cardiac medications before your next appointment, please call your pharmacy*  Lab Work: None ordered If you have labs (blood work) drawn today and your tests are completely normal, you will receive your results only by: MyChart Message (if you have MyChart) OR A paper copy in the mail If you have any lab test that is abnormal or we need to change your treatment, we will call you to review the results.  Follow-Up: At [facility], you and your health needs are our priority.  As part of our continuing mission to provide you with exceptional heart care, we have created designated Provider Care Teams.  These Care Teams include your primary Cardiologist (physician) and Advanced Practice Providers (APPs -  Physician Assistants and Nurse Practitioners) who all work together to provide you with the care you need, when you need it.  Your next appointment:   3-4 month(s)  Provider:   Charlton Haws, MD     Other Instructions Check your blood pressure daily, 1 hr after morning medications for 2 weeks, keep a log and send [place] the readings through mychart at the end of the 2 weeks.   Low-Sodium Eating Plan Salt (sodium) helps you keep a healthy balance of fluids in your body. Too much sodium can raise your blood pressure. It can also cause fluid and waste to be held in your body. Your health care provider or dietitian may recommend a low-sodium eating plan if you have high blood pressure (hypertension), kidney disease, liver disease, or heart failure. Eating less sodium can help lower your blood pressure and reduce swelling. It can also protect your heart, liver, and kidneys. What are tips for following this plan? Reading food labels  Check food labels for the amount of sodium per serving. If you eat more than one serving, you must multiply the listed amount  by the number of servings. Choose foods with less than 140 milligrams (mg) of sodium per serving. Avoid foods with 300 mg of sodium or more per serving. Always check how much sodium is in a product, even if the label says "unsalted" or "no salt added." Shopping  Buy products labeled as "low-sodium" or "no salt added." Buy fresh foods. Avoid canned foods and pre-made or frozen meals. Avoid canned, cured, or processed meats. Buy breads that have less than 80 mg of sodium per slice. Cooking  Eat more home-cooked food. Try to eat less restaurant, buffet, and fast food. Try not to add salt when you cook. Use salt-free seasonings or herbs instead of table salt or sea salt. Check with your provider or pharmacist before using salt substitutes. Cook with plant-based oils, such as canola, sunflower, or olive oil. Meal planning When eating at a restaurant, ask if your food can be made with less salt or no salt. Avoid dishes labeled as brined, pickled, cured, or smoked. Avoid dishes made with soy sauce, miso, or teriyaki sauce. Avoid foods that have monosodium glutamate (MSG) in them. MSG may be added to some restaurant food, sauces, soups, bouillon, and canned foods. Make meals that can be grilled, baked, poached, roasted, or steamed. These are often made with less sodium. General information Try to limit your sodium intake to 1,500-2,300 mg each day, or the amount told by your provider. What foods should I eat? Fruits Fresh, frozen, or canned fruit. Fruit juice. Vegetables Fresh  or frozen vegetables. "No salt added" canned vegetables. "No salt added" tomato sauce and paste. Low-sodium or reduced-sodium tomato and vegetable juice. Grains Low-sodium cereals, such as oats, puffed wheat and rice, and shredded wheat. Low-sodium crackers. Unsalted rice. Unsalted pasta. Low-sodium bread. Whole grain breads and whole grain pasta. Meats and other proteins Fresh or frozen meat, poultry, seafood, and fish.  These should have no added salt. Low-sodium canned tuna and salmon. Unsalted nuts. Dried peas, beans, and lentils without added salt. Unsalted canned beans. Eggs. Unsalted nut butters. Dairy Milk. Soy milk. Cheese that is naturally low in sodium, such as ricotta cheese, fresh mozzarella, or Swiss cheese. Low-sodium or reduced-sodium cheese. Cream cheese. Yogurt. Seasonings and condiments Fresh and dried herbs and spices. Salt-free seasonings. Low-sodium mustard and ketchup. Sodium-free salad dressing. Sodium-free light mayonnaise. Fresh or refrigerated horseradish. Lemon juice. Vinegar. Other foods Homemade, reduced-sodium, or low-sodium soups. Unsalted popcorn and pretzels. Low-salt or salt-free chips. The items listed above may not be all the foods and drinks you can have. Talk to a dietitian to learn more. What foods should I avoid? Vegetables Sauerkraut, pickled vegetables, and relishes. Olives. [place] fries. Onion rings. Regular canned vegetables, except low-sodium or reduced-sodium items. Regular canned tomato sauce and paste. Regular tomato and vegetable juice. Frozen vegetables in sauces. Grains Instant hot cereals. Bread stuffing, pancake, and biscuit mixes. Croutons. Seasoned rice or pasta mixes. Noodle soup cups. Boxed or frozen macaroni and cheese. Regular salted crackers. Self-rising flour. Meats and other proteins Meat or fish that is salted, canned, smoked, spiced, or pickled. Precooked or cured meat, such as sausages or meat loaves. Tomasa Blase. Ham. Pepperoni. Hot dogs. Corned beef. Chipped beef. Salt pork. Jerky. Pickled herring, anchovies, and sardines. Regular canned tuna. Salted nuts. Dairy Processed cheese and cheese spreads. Hard cheeses. Cheese curds. Blue cheese. Feta cheese. String cheese. Regular cottage cheese. Buttermilk. Canned milk. Fats and oils Salted butter. Regular margarine. Ghee. Bacon fat. Seasonings and condiments Onion salt, garlic salt, seasoned salt, table  salt, and sea salt. Canned and packaged gravies. Worcestershire sauce. Tartar sauce. Barbecue sauce. Teriyaki sauce. Soy sauce, including reduced-sodium soy sauce. Steak sauce. Fish sauce. Oyster sauce. Cocktail sauce. Horseradish that you find on the shelf. Regular ketchup and mustard. Meat flavorings and tenderizers. Bouillon cubes. Hot sauce. Pre-made or packaged marinades. Pre-made or packaged taco seasonings. Relishes. Regular salad dressings. Salsa. Other foods Salted popcorn and pretzels. Corn chips and puffs. Potato and tortilla chips. Canned or dried soups. Pizza. Frozen entrees and pot pies. The items listed above may not be all the foods and drinks you should avoid. Talk to a dietitian to learn more. This information is not intended to replace advice given to you by your health care provider. Make sure you discuss any questions you have with your health care provider. Document Revised: 06/17/2022 Document Reviewed: 06/17/2022 Elsevier Patient Education  2024 [employer]. Heart-Healthy Eating Plan Many factors influence your heart health, including eating and exercise habits. Heart health is also called coronary health. Coronary risk increases with abnormal blood fat (lipid) levels. A heart-healthy eating plan includes limiting unhealthy fats, increasing healthy fats, limiting salt (sodium) intake, and making other diet and lifestyle changes. What is my plan? Your health care provider may recommend that: You limit your fat intake to _________% or less of your total calories each day. You limit your saturated fat intake to _________% or less of your total calories each day. You limit the amount of cholesterol in your diet to less than _________  mg per day. You limit the amount of sodium in your diet to less than _________ mg per day. What are tips for following this plan? Cooking Cook foods using methods other than frying. Baking, boiling, grilling, and broiling are all good options.  Other ways to reduce fat include: Removing the skin from poultry. Removing all visible fats from meats. Steaming vegetables in water or broth. Meal planning  At meals, imagine dividing your plate into fourths: Fill one-half of your plate with vegetables and green salads. Fill one-fourth of your plate with whole grains. Fill one-fourth of your plate with lean protein foods. Eat 2-4 cups of vegetables per day. One cup of vegetables equals 1 cup (91 g) broccoli or cauliflower florets, 2 medium carrots, 1 large bell pepper, 1 large sweet potato, 1 large tomato, 1 medium white potato, 2 cups (150 g) raw leafy greens. Eat 1-2 cups of fruit per day. One cup of fruit equals 1 small apple, 1 large banana, 1 cup (237 g) mixed fruit, 1 large orange,  cup (82 g) dried fruit, 1 cup (240 mL) 100% fruit juice. Eat more foods that contain soluble fiber. Examples include apples, broccoli, carrots, beans, peas, and barley. Aim to get 25-30 g of fiber per day. Increase your consumption of legumes, nuts, and seeds to 4-5 servings per week. One serving of dried beans or legumes equals  cup (90 g) cooked, 1 serving of nuts is  oz (12 almonds, 24 pistachios, or 7 walnut halves), and 1 serving of seeds equals  oz (8 g). Fats Choose healthy fats more often. Choose monounsaturated and polyunsaturated fats, such as olive and canola oils, avocado oil, flaxseeds, walnuts, almonds, and seeds. Eat more omega-3 fats. Choose salmon, mackerel, sardines, tuna, flaxseed oil, and ground flaxseeds. Aim to eat fish at least 2 times each week. Check food labels carefully to identify foods with trans fats or high amounts of saturated fat. Limit saturated fats. These are found in animal products, such as meats, butter, and cream. Plant sources of saturated fats include palm oil, palm kernel oil, and coconut oil. Avoid foods with partially hydrogenated oils in them. These contain trans fats. Examples are stick margarine, some tub  margarines, cookies, crackers, and other baked goods. Avoid fried foods. General information Eat more home-cooked food and less restaurant, buffet, and fast food. Limit or avoid alcohol. Limit foods that are high in added sugar and simple starches such as foods made using white refined flour (white breads, pastries, sweets). Lose weight if you are overweight. Losing just 5-10% of your body weight can help your overall health and prevent diseases such as diabetes and heart disease. Monitor your sodium intake, especially if you have high blood pressure. Talk with your health care provider about your sodium intake. Try to incorporate more vegetarian meals weekly. What foods should I eat? Fruits All fresh, canned (in natural juice), or frozen fruits. Vegetables Fresh or frozen vegetables (raw, steamed, roasted, or grilled). Green salads. Grains Most grains. Choose whole wheat and whole grains most of the time. Rice and pasta, including brown rice and pastas made with whole wheat. Meats and other proteins Lean, well-trimmed beef, veal, pork, and lamb. Chicken and [place] without skin. All fish and shellfish. Wild duck, rabbit, pheasant, and venison. Egg whites or low-cholesterol egg substitutes. Dried beans, peas, lentils, and tofu. Seeds and most nuts. Dairy Low-fat or nonfat cheeses, including ricotta and mozzarella. Skim or 1% milk (liquid, powdered, or evaporated). Buttermilk made with low-fat milk. Nonfat  or low-fat yogurt. Fats and oils Non-hydrogenated (trans-free) margarines. Vegetable oils, including soybean, sesame, sunflower, olive, avocado, peanut, safflower, corn, canola, and cottonseed. Salad dressings or mayonnaise made with a vegetable oil. Beverages Water (mineral or sparkling). Coffee and tea. Unsweetened ice tea. Diet beverages. Sweets and desserts Sherbet, gelatin, and fruit ice. Small amounts of dark chocolate. Limit all sweets and desserts. Seasonings and condiments All  seasonings and condiments. The items listed above may not be a complete list of foods and beverages you can eat. Contact a dietitian for more options. What foods should I avoid? Fruits Canned fruit in heavy syrup. Fruit in cream or butter sauce. Fried fruit. Limit coconut. Vegetables Vegetables cooked in cheese, cream, or butter sauce. Fried vegetables. Grains Breads made with saturated or trans fats, oils, or whole milk. Croissants. Sweet rolls. Donuts. High-fat crackers, such as cheese crackers and chips. Meats and other proteins Fatty meats, such as hot dogs, ribs, sausage, bacon, rib-eye roast or steak. High-fat deli meats, such as salami and bologna. Caviar. Domestic duck and goose. Organ meats, such as liver. Dairy Cream, sour cream, cream cheese, and creamed cottage cheese. Whole-milk cheeses. Whole or 2% milk (liquid, evaporated, or condensed). Whole buttermilk. Cream sauce or high-fat cheese sauce. Whole-milk yogurt. Fats and oils Meat fat, or shortening. Cocoa butter, hydrogenated oils, palm oil, coconut oil, palm kernel oil. Solid fats and shortenings, including bacon fat, salt pork, lard, and butter. Nondairy cream substitutes. Salad dressings with cheese or sour cream. Beverages Regular sodas and any drinks with added sugar. Sweets and desserts Frosting. Pudding. Cookies. Cakes. Pies. Milk chocolate or white chocolate. Buttered syrups. Full-fat ice cream or ice cream drinks. The items listed above may not be a complete list of foods and beverages to avoid. Contact a dietitian for more information. Summary Heart-healthy meal planning includes limiting unhealthy fats, increasing healthy fats, limiting salt (sodium) intake and making other diet and lifestyle changes. Lose weight if you are overweight. Losing just 5-10% of your body weight can help your overall health and prevent diseases such as diabetes and heart disease. Focus on eating a balance of foods, including fruits and  vegetables, low-fat or nonfat dairy, lean protein, nuts and legumes, whole grains, and heart-healthy oils and fats. This information is not intended to replace advice given to you by your health care provider. Make sure you discuss any questions you have with your health care provider. Document Revised: 07/06/2021 Document Reviewed: 07/06/2021 Elsevier Patient Education  2024 [employer].

## 2023-03-11 DIAGNOSIS — N2581 Secondary hyperparathyroidism of renal origin: Secondary | ICD-10-CM | POA: Diagnosis not present

## 2023-03-11 DIAGNOSIS — D696 Thrombocytopenia, unspecified: Secondary | ICD-10-CM | POA: Diagnosis not present

## 2023-03-11 DIAGNOSIS — D509 Iron deficiency anemia, unspecified: Secondary | ICD-10-CM | POA: Diagnosis not present

## 2023-03-11 DIAGNOSIS — Z992 Dependence on renal dialysis: Secondary | ICD-10-CM | POA: Diagnosis not present

## 2023-03-11 DIAGNOSIS — D689 Coagulation defect, unspecified: Secondary | ICD-10-CM | POA: Diagnosis not present

## 2023-03-11 DIAGNOSIS — N186 End stage renal disease: Secondary | ICD-10-CM | POA: Diagnosis not present

## 2023-03-14 ENCOUNTER — Encounter (HOSPITAL_COMMUNITY): Payer: Self-pay | Admitting: Emergency Medicine

## 2023-03-14 ENCOUNTER — Ambulatory Visit: Payer: Medicare Other | Admitting: Podiatry

## 2023-03-14 ENCOUNTER — Inpatient Hospital Stay (HOSPITAL_COMMUNITY)
Admission: EM | Admit: 2023-03-14 | Discharge: 2023-04-15 | DRG: 239 | Disposition: E | Payer: Medicare Other | Attending: Internal Medicine | Admitting: Internal Medicine

## 2023-03-14 ENCOUNTER — Emergency Department (HOSPITAL_COMMUNITY): Payer: Medicare Other

## 2023-03-14 ENCOUNTER — Other Ambulatory Visit: Payer: Self-pay

## 2023-03-14 DIAGNOSIS — Z951 Presence of aortocoronary bypass graft: Secondary | ICD-10-CM | POA: Diagnosis not present

## 2023-03-14 DIAGNOSIS — I10 Essential (primary) hypertension: Secondary | ICD-10-CM | POA: Diagnosis not present

## 2023-03-14 DIAGNOSIS — D689 Coagulation defect, unspecified: Secondary | ICD-10-CM | POA: Diagnosis not present

## 2023-03-14 DIAGNOSIS — Z66 Do not resuscitate: Secondary | ICD-10-CM | POA: Diagnosis present

## 2023-03-14 DIAGNOSIS — Z833 Family history of diabetes mellitus: Secondary | ICD-10-CM

## 2023-03-14 DIAGNOSIS — Z5982 Transportation insecurity: Secondary | ICD-10-CM

## 2023-03-14 DIAGNOSIS — I252 Old myocardial infarction: Secondary | ICD-10-CM | POA: Diagnosis not present

## 2023-03-14 DIAGNOSIS — Z87891 Personal history of nicotine dependence: Secondary | ICD-10-CM

## 2023-03-14 DIAGNOSIS — Z8249 Family history of ischemic heart disease and other diseases of the circulatory system: Secondary | ICD-10-CM

## 2023-03-14 DIAGNOSIS — N186 End stage renal disease: Secondary | ICD-10-CM | POA: Diagnosis not present

## 2023-03-14 DIAGNOSIS — D631 Anemia in chronic kidney disease: Secondary | ICD-10-CM | POA: Diagnosis not present

## 2023-03-14 DIAGNOSIS — E11649 Type 2 diabetes mellitus with hypoglycemia without coma: Secondary | ICD-10-CM | POA: Diagnosis not present

## 2023-03-14 DIAGNOSIS — G8321 Monoplegia of upper limb affecting right dominant side: Secondary | ICD-10-CM | POA: Diagnosis not present

## 2023-03-14 DIAGNOSIS — N2581 Secondary hyperparathyroidism of renal origin: Secondary | ICD-10-CM | POA: Diagnosis not present

## 2023-03-14 DIAGNOSIS — E1169 Type 2 diabetes mellitus with other specified complication: Secondary | ICD-10-CM | POA: Diagnosis not present

## 2023-03-14 DIAGNOSIS — Z556 Problems related to health literacy: Secondary | ICD-10-CM

## 2023-03-14 DIAGNOSIS — Z794 Long term (current) use of insulin: Secondary | ICD-10-CM | POA: Diagnosis not present

## 2023-03-14 DIAGNOSIS — E1165 Type 2 diabetes mellitus with hyperglycemia: Secondary | ICD-10-CM | POA: Diagnosis not present

## 2023-03-14 DIAGNOSIS — Z992 Dependence on renal dialysis: Secondary | ICD-10-CM

## 2023-03-14 DIAGNOSIS — G459 Transient cerebral ischemic attack, unspecified: Secondary | ICD-10-CM | POA: Diagnosis not present

## 2023-03-14 DIAGNOSIS — R001 Bradycardia, unspecified: Secondary | ICD-10-CM | POA: Diagnosis not present

## 2023-03-14 DIAGNOSIS — R112 Nausea with vomiting, unspecified: Secondary | ICD-10-CM | POA: Diagnosis not present

## 2023-03-14 DIAGNOSIS — D696 Thrombocytopenia, unspecified: Secondary | ICD-10-CM | POA: Diagnosis not present

## 2023-03-14 DIAGNOSIS — E1122 Type 2 diabetes mellitus with diabetic chronic kidney disease: Secondary | ICD-10-CM | POA: Diagnosis not present

## 2023-03-14 DIAGNOSIS — E871 Hypo-osmolality and hyponatremia: Secondary | ICD-10-CM | POA: Diagnosis not present

## 2023-03-14 DIAGNOSIS — D509 Iron deficiency anemia, unspecified: Secondary | ICD-10-CM | POA: Diagnosis not present

## 2023-03-14 DIAGNOSIS — I340 Nonrheumatic mitral (valve) insufficiency: Secondary | ICD-10-CM | POA: Diagnosis not present

## 2023-03-14 DIAGNOSIS — R5381 Other malaise: Secondary | ICD-10-CM | POA: Diagnosis present

## 2023-03-14 DIAGNOSIS — Z87442 Personal history of urinary calculi: Secondary | ICD-10-CM

## 2023-03-14 DIAGNOSIS — D72829 Elevated white blood cell count, unspecified: Secondary | ICD-10-CM | POA: Diagnosis not present

## 2023-03-14 DIAGNOSIS — I6389 Other cerebral infarction: Secondary | ICD-10-CM | POA: Diagnosis not present

## 2023-03-14 DIAGNOSIS — Z7902 Long term (current) use of antithrombotics/antiplatelets: Secondary | ICD-10-CM

## 2023-03-14 DIAGNOSIS — I878 Other specified disorders of veins: Secondary | ICD-10-CM | POA: Diagnosis present

## 2023-03-14 DIAGNOSIS — R0681 Apnea, not elsewhere classified: Secondary | ICD-10-CM | POA: Diagnosis not present

## 2023-03-14 DIAGNOSIS — I96 Gangrene, not elsewhere classified: Principal | ICD-10-CM | POA: Diagnosis present

## 2023-03-14 DIAGNOSIS — Z955 Presence of coronary angioplasty implant and graft: Secondary | ICD-10-CM | POA: Diagnosis not present

## 2023-03-14 DIAGNOSIS — R0989 Other specified symptoms and signs involving the circulatory and respiratory systems: Secondary | ICD-10-CM | POA: Diagnosis not present

## 2023-03-14 DIAGNOSIS — G8324 Monoplegia of upper limb affecting left nondominant side: Secondary | ICD-10-CM | POA: Diagnosis not present

## 2023-03-14 DIAGNOSIS — Z91013 Allergy to seafood: Secondary | ICD-10-CM

## 2023-03-14 DIAGNOSIS — F32A Depression, unspecified: Secondary | ICD-10-CM | POA: Diagnosis present

## 2023-03-14 DIAGNOSIS — E1152 Type 2 diabetes mellitus with diabetic peripheral angiopathy with gangrene: Principal | ICD-10-CM | POA: Diagnosis present

## 2023-03-14 DIAGNOSIS — Z7982 Long term (current) use of aspirin: Secondary | ICD-10-CM

## 2023-03-14 DIAGNOSIS — I2510 Atherosclerotic heart disease of native coronary artery without angina pectoris: Secondary | ICD-10-CM | POA: Diagnosis not present

## 2023-03-14 DIAGNOSIS — E785 Hyperlipidemia, unspecified: Secondary | ICD-10-CM | POA: Diagnosis not present

## 2023-03-14 DIAGNOSIS — R471 Dysarthria and anarthria: Secondary | ICD-10-CM | POA: Diagnosis not present

## 2023-03-14 DIAGNOSIS — E8770 Fluid overload, unspecified: Secondary | ICD-10-CM | POA: Diagnosis not present

## 2023-03-14 DIAGNOSIS — I120 Hypertensive chronic kidney disease with stage 5 chronic kidney disease or end stage renal disease: Secondary | ICD-10-CM | POA: Diagnosis not present

## 2023-03-14 DIAGNOSIS — Z79899 Other long term (current) drug therapy: Secondary | ICD-10-CM

## 2023-03-14 DIAGNOSIS — R4781 Slurred speech: Secondary | ICD-10-CM | POA: Diagnosis not present

## 2023-03-14 DIAGNOSIS — R29703 NIHSS score 3: Secondary | ICD-10-CM | POA: Diagnosis not present

## 2023-03-14 DIAGNOSIS — E119 Type 2 diabetes mellitus without complications: Secondary | ICD-10-CM

## 2023-03-14 DIAGNOSIS — M79672 Pain in left foot: Secondary | ICD-10-CM | POA: Diagnosis not present

## 2023-03-14 DIAGNOSIS — I959 Hypotension, unspecified: Secondary | ICD-10-CM | POA: Diagnosis not present

## 2023-03-14 DIAGNOSIS — Z9862 Peripheral vascular angioplasty status: Secondary | ICD-10-CM

## 2023-03-14 LAB — CBC WITH DIFFERENTIAL/PLATELET
Abs Immature Granulocytes: 0.06 10*3/uL (ref 0.00–0.07)
Basophils Absolute: 0.1 10*3/uL (ref 0.0–0.1)
Basophils Relative: 1 %
Eosinophils Absolute: 0 10*3/uL (ref 0.0–0.5)
Eosinophils Relative: 0 %
HCT: 27.3 % — ABNORMAL LOW (ref 39.0–52.0)
Hemoglobin: 8.1 g/dL — ABNORMAL LOW (ref 13.0–17.0)
Immature Granulocytes: 0 %
Lymphocytes Relative: 4 %
Lymphs Abs: 0.5 10*3/uL — ABNORMAL LOW (ref 0.7–4.0)
MCH: 28.7 pg (ref 26.0–34.0)
MCHC: 29.7 g/dL — ABNORMAL LOW (ref 30.0–36.0)
MCV: 96.8 fL (ref 80.0–100.0)
Monocytes Absolute: 0.7 10*3/uL (ref 0.1–1.0)
Monocytes Relative: 5 %
Neutro Abs: 12.1 10*3/uL — ABNORMAL HIGH (ref 1.7–7.7)
Neutrophils Relative %: 90 %
Platelets: 427 10*3/uL — ABNORMAL HIGH (ref 150–400)
RBC: 2.82 MIL/uL — ABNORMAL LOW (ref 4.22–5.81)
RDW: 17.4 % — ABNORMAL HIGH (ref 11.5–15.5)
WBC: 13.5 10*3/uL — ABNORMAL HIGH (ref 4.0–10.5)
nRBC: 0 % (ref 0.0–0.2)

## 2023-03-14 LAB — BASIC METABOLIC PANEL
Anion gap: 16 — ABNORMAL HIGH (ref 5–15)
BUN: 26 mg/dL — ABNORMAL HIGH (ref 8–23)
CO2: 31 mmol/L (ref 22–32)
Calcium: 8.4 mg/dL — ABNORMAL LOW (ref 8.9–10.3)
Chloride: 85 mmol/L — ABNORMAL LOW (ref 98–111)
Creatinine, Ser: 4.48 mg/dL — ABNORMAL HIGH (ref 0.61–1.24)
GFR, Estimated: 13 mL/min — ABNORMAL LOW (ref >60)
Glucose, Bld: 214 mg/dL — ABNORMAL HIGH (ref 70–99)
Potassium: 4.1 mmol/L (ref 3.5–5.1)
Sodium: 132 mmol/L — ABNORMAL LOW (ref 135–145)

## 2023-03-14 NOTE — ED Triage Notes (Signed)
Pt brought in by family due to his whole left foot has turned black and is draining puss. Pt recently admitted for gangrene. Pt has appt tomorrow in [place] to get surgery scheduled to remove the dead tissue.

## 2023-03-15 ENCOUNTER — Telehealth: Payer: Self-pay | Admitting: Podiatry

## 2023-03-15 ENCOUNTER — Ambulatory Visit: Payer: Medicare Other | Admitting: Podiatry

## 2023-03-15 DIAGNOSIS — N186 End stage renal disease: Secondary | ICD-10-CM | POA: Diagnosis present

## 2023-03-15 DIAGNOSIS — G8324 Monoplegia of upper limb affecting left nondominant side: Secondary | ICD-10-CM | POA: Diagnosis not present

## 2023-03-15 DIAGNOSIS — E871 Hypo-osmolality and hyponatremia: Secondary | ICD-10-CM | POA: Diagnosis not present

## 2023-03-15 DIAGNOSIS — D631 Anemia in chronic kidney disease: Secondary | ICD-10-CM | POA: Diagnosis not present

## 2023-03-15 DIAGNOSIS — I96 Gangrene, not elsewhere classified: Secondary | ICD-10-CM | POA: Diagnosis not present

## 2023-03-15 DIAGNOSIS — N250 Renal osteodystrophy: Secondary | ICD-10-CM | POA: Diagnosis not present

## 2023-03-15 DIAGNOSIS — D72829 Elevated white blood cell count, unspecified: Secondary | ICD-10-CM | POA: Diagnosis not present

## 2023-03-15 DIAGNOSIS — Z8249 Family history of ischemic heart disease and other diseases of the circulatory system: Secondary | ICD-10-CM | POA: Diagnosis not present

## 2023-03-15 DIAGNOSIS — Z951 Presence of aortocoronary bypass graft: Secondary | ICD-10-CM | POA: Diagnosis not present

## 2023-03-15 DIAGNOSIS — G459 Transient cerebral ischemic attack, unspecified: Secondary | ICD-10-CM | POA: Diagnosis not present

## 2023-03-15 DIAGNOSIS — E1122 Type 2 diabetes mellitus with diabetic chronic kidney disease: Secondary | ICD-10-CM | POA: Diagnosis not present

## 2023-03-15 DIAGNOSIS — L089 Local infection of the skin and subcutaneous tissue, unspecified: Secondary | ICD-10-CM | POA: Diagnosis not present

## 2023-03-15 DIAGNOSIS — E11628 Type 2 diabetes mellitus with other skin complications: Secondary | ICD-10-CM | POA: Diagnosis not present

## 2023-03-15 DIAGNOSIS — Z794 Long term (current) use of insulin: Secondary | ICD-10-CM | POA: Diagnosis not present

## 2023-03-15 DIAGNOSIS — R471 Dysarthria and anarthria: Secondary | ICD-10-CM | POA: Diagnosis not present

## 2023-03-15 DIAGNOSIS — I6523 Occlusion and stenosis of bilateral carotid arteries: Secondary | ICD-10-CM | POA: Diagnosis not present

## 2023-03-15 DIAGNOSIS — I10 Essential (primary) hypertension: Secondary | ICD-10-CM | POA: Diagnosis not present

## 2023-03-15 DIAGNOSIS — E785 Hyperlipidemia, unspecified: Secondary | ICD-10-CM

## 2023-03-15 DIAGNOSIS — E1152 Type 2 diabetes mellitus with diabetic peripheral angiopathy with gangrene: Secondary | ICD-10-CM | POA: Diagnosis not present

## 2023-03-15 DIAGNOSIS — I340 Nonrheumatic mitral (valve) insufficiency: Secondary | ICD-10-CM | POA: Diagnosis not present

## 2023-03-15 DIAGNOSIS — Z955 Presence of coronary angioplasty implant and graft: Secondary | ICD-10-CM | POA: Diagnosis not present

## 2023-03-15 DIAGNOSIS — G8321 Monoplegia of upper limb affecting right dominant side: Secondary | ICD-10-CM | POA: Diagnosis not present

## 2023-03-15 DIAGNOSIS — Z87891 Personal history of nicotine dependence: Secondary | ICD-10-CM | POA: Diagnosis not present

## 2023-03-15 DIAGNOSIS — Z992 Dependence on renal dialysis: Secondary | ICD-10-CM | POA: Diagnosis not present

## 2023-03-15 DIAGNOSIS — M19072 Primary osteoarthritis, left ankle and foot: Secondary | ICD-10-CM | POA: Diagnosis not present

## 2023-03-15 DIAGNOSIS — F32A Depression, unspecified: Secondary | ICD-10-CM | POA: Diagnosis present

## 2023-03-15 DIAGNOSIS — E1169 Type 2 diabetes mellitus with other specified complication: Secondary | ICD-10-CM

## 2023-03-15 DIAGNOSIS — Z89422 Acquired absence of other left toe(s): Secondary | ICD-10-CM | POA: Diagnosis not present

## 2023-03-15 DIAGNOSIS — I2510 Atherosclerotic heart disease of native coronary artery without angina pectoris: Secondary | ICD-10-CM

## 2023-03-15 DIAGNOSIS — E1165 Type 2 diabetes mellitus with hyperglycemia: Secondary | ICD-10-CM | POA: Diagnosis not present

## 2023-03-15 DIAGNOSIS — I120 Hypertensive chronic kidney disease with stage 5 chronic kidney disease or end stage renal disease: Secondary | ICD-10-CM | POA: Diagnosis not present

## 2023-03-15 DIAGNOSIS — M869 Osteomyelitis, unspecified: Secondary | ICD-10-CM | POA: Diagnosis not present

## 2023-03-15 DIAGNOSIS — E11649 Type 2 diabetes mellitus with hypoglycemia without coma: Secondary | ICD-10-CM | POA: Diagnosis not present

## 2023-03-15 DIAGNOSIS — Z66 Do not resuscitate: Secondary | ICD-10-CM | POA: Diagnosis not present

## 2023-03-15 DIAGNOSIS — I252 Old myocardial infarction: Secondary | ICD-10-CM | POA: Diagnosis not present

## 2023-03-15 LAB — GLUCOSE, CAPILLARY
Glucose-Capillary: 178 mg/dL — ABNORMAL HIGH (ref 70–99)
Glucose-Capillary: 127 mg/dL — ABNORMAL HIGH (ref 70–99)
Glucose-Capillary: 124 mg/dL — ABNORMAL HIGH (ref 70–99)
Glucose-Capillary: 229 mg/dL — ABNORMAL HIGH (ref 70–99)

## 2023-03-15 LAB — HEPATITIS B SURFACE ANTIGEN: Hepatitis B Surface Ag: NONREACTIVE

## 2023-03-15 LAB — ALBUMIN: Albumin: 1.9 g/dL — ABNORMAL LOW (ref 3.5–5.0)

## 2023-03-15 LAB — PHOSPHORUS: Phosphorus: 4.1 mg/dL (ref 2.5–4.6)

## 2023-03-15 MED ORDER — FENTANYL CITRATE PF 50 MCG/ML IJ SOSY
50.0000 ug | PREFILLED_SYRINGE | Freq: Once | INTRAMUSCULAR | Status: AC
  Administered 2023-03-15: 50 ug via INTRAVENOUS
  Filled 2023-03-15: qty 1

## 2023-03-15 MED ORDER — ACETAMINOPHEN 650 MG RE SUPP: 650.0000 mg | Freq: Four times a day (QID) | RECTAL | Status: DC | PRN

## 2023-03-15 MED ORDER — DOXERCALCIFEROL 4 MCG/2ML IV SOLN
4.0000 ug | INTRAVENOUS | Status: DC
  Administered 2023-03-18: 4 ug via INTRAVENOUS
  Filled 2023-03-15 (×3): qty 2

## 2023-03-15 MED ORDER — LATANOPROST 0.005 % OP SOLN
2.0000 [drp] | Freq: Two times a day (BID) | OPHTHALMIC | Status: DC
  Administered 2023-03-15 – 2023-03-19 (×8): 2 [drp] via OPHTHALMIC
  Filled 2023-03-15 (×2): qty 2.5

## 2023-03-15 MED ORDER — MELATONIN 5 MG PO TABS
5.0000 mg | ORAL_TABLET | Freq: Once | ORAL | Status: AC
  Administered 2023-03-16: 5 mg via ORAL
  Filled 2023-03-15: qty 1

## 2023-03-15 MED ORDER — INSULIN ASPART 100 UNIT/ML IJ SOLN
0.0000 [IU] | Freq: Every day | INTRAMUSCULAR | Status: DC
  Administered 2023-03-15: 2 [IU] via SUBCUTANEOUS

## 2023-03-15 MED ORDER — OXYCODONE HCL 5 MG PO TABS
5.0000 mg | ORAL_TABLET | ORAL | Status: DC | PRN
  Administered 2023-03-15 – 2023-03-19 (×9): 5 mg via ORAL
  Filled 2023-03-15 (×9): qty 1

## 2023-03-15 MED ORDER — ONDANSETRON HCL 4 MG PO TABS: 4.0000 mg | ORAL_TABLET | Freq: Four times a day (QID) | ORAL | Status: DC | PRN

## 2023-03-15 MED ORDER — METOPROLOL TARTRATE 12.5 MG HALF TABLET
12.5000 mg | ORAL_TABLET | Freq: Two times a day (BID) | ORAL | Status: DC
  Administered 2023-03-15 – 2023-03-19 (×6): 12.5 mg via ORAL
  Filled 2023-03-15 (×8): qty 1

## 2023-03-15 MED ORDER — VANCOMYCIN HCL IN DEXTROSE 1-5 GM/200ML-% IV SOLN
1000.0000 mg | Freq: Once | INTRAVENOUS | Status: AC
  Administered 2023-03-15: 1000 mg via INTRAVENOUS
  Filled 2023-03-15: qty 200

## 2023-03-15 MED ORDER — CINACALCET HCL 30 MG PO TABS
180.0000 mg | ORAL_TABLET | ORAL | Status: DC
  Administered 2023-03-18: 180 mg via ORAL
  Filled 2023-03-15 (×2): qty 6

## 2023-03-15 MED ORDER — SODIUM CHLORIDE 0.9 % IV SOLN
2.0000 g | Freq: Once | INTRAVENOUS | Status: AC
  Administered 2023-03-15: 2 g via INTRAVENOUS
  Filled 2023-03-15: qty 12.5

## 2023-03-15 MED ORDER — CHLORHEXIDINE GLUCONATE CLOTH 2 % EX PADS
6.0000 | MEDICATED_PAD | Freq: Every day | CUTANEOUS | Status: DC
  Administered 2023-03-16 – 2023-03-19 (×4): 6 via TOPICAL

## 2023-03-15 MED ORDER — MORPHINE SULFATE (PF) 2 MG/ML IV SOLN: 2.0000 mg | INTRAVENOUS | Status: DC | PRN

## 2023-03-15 MED ORDER — VANCOMYCIN HCL IN DEXTROSE 1-5 GM/200ML-% IV SOLN
1000.0000 mg | INTRAVENOUS | Status: DC
  Filled 2023-03-15: qty 200

## 2023-03-15 MED ORDER — SEVELAMER CARBONATE 800 MG PO TABS
2400.0000 mg | ORAL_TABLET | Freq: Three times a day (TID) | ORAL | Status: DC
  Administered 2023-03-15 – 2023-03-19 (×12): 2400 mg via ORAL
  Filled 2023-03-15 (×12): qty 3

## 2023-03-15 MED ORDER — SODIUM CHLORIDE 0.9 % IV SOLN
1.0000 g | INTRAVENOUS | Status: DC
  Administered 2023-03-15 – 2023-03-17 (×3): 1 g via INTRAVENOUS
  Filled 2023-03-15 (×3): qty 10

## 2023-03-15 MED ORDER — ONDANSETRON HCL 4 MG/2ML IJ SOLN
4.0000 mg | Freq: Four times a day (QID) | INTRAMUSCULAR | Status: DC | PRN
  Administered 2023-03-18 – 2023-03-19 (×2): 4 mg via INTRAVENOUS
  Filled 2023-03-15 (×2): qty 2

## 2023-03-15 MED ORDER — ACETAMINOPHEN 325 MG PO TABS
650.0000 mg | ORAL_TABLET | Freq: Four times a day (QID) | ORAL | Status: DC | PRN
  Administered 2023-03-17 – 2023-03-18 (×2): 650 mg via ORAL
  Filled 2023-03-15 (×2): qty 2

## 2023-03-15 MED ORDER — DARBEPOETIN ALFA 150 MCG/0.3ML IJ SOSY
150.0000 ug | PREFILLED_SYRINGE | INTRAMUSCULAR | Status: DC
  Administered 2023-03-15: 150 ug via SUBCUTANEOUS
  Filled 2023-03-15: qty 0.3

## 2023-03-15 MED ORDER — INSULIN ASPART 100 UNIT/ML IJ SOLN
0.0000 [IU] | Freq: Three times a day (TID) | INTRAMUSCULAR | Status: DC
  Administered 2023-03-15: 2 [IU] via SUBCUTANEOUS
  Administered 2023-03-15: 3 [IU] via SUBCUTANEOUS
  Administered 2023-03-16: 2 [IU] via SUBCUTANEOUS
  Administered 2023-03-17: 3 [IU] via SUBCUTANEOUS

## 2023-03-15 MED ORDER — BRIMONIDINE TARTRATE 0.2 % OP SOLN
1.0000 [drp] | Freq: Every day | OPHTHALMIC | Status: DC
  Administered 2023-03-15 – 2023-03-19 (×5): 1 [drp] via OPHTHALMIC
  Filled 2023-03-15: qty 5

## 2023-03-15 MED ORDER — ATORVASTATIN CALCIUM 80 MG PO TABS
80.0000 mg | ORAL_TABLET | Freq: Every day | ORAL | Status: DC
  Administered 2023-03-15 – 2023-03-19 (×4): 80 mg via ORAL
  Filled 2023-03-15 (×4): qty 1

## 2023-03-15 NOTE — Assessment & Plan Note (Signed)
-   Holding aspirin and Plavix in the setting of possible upcoming amputation - Continue statin, continue beta-blocker - Continue to monitor

## 2023-03-15 NOTE — ED Notes (Signed)
Pt's left foot is missing majority of skin, there is also rolled dried skin around foot. Toes 2-5 are black with a foul odor. Pt unable to bear weight on foot at this time.

## 2023-03-15 NOTE — Consult Note (Signed)
Hospital Consult    Reason for Consult:  worsening left foot wound Requesting Physician:  Pamella Pert, MD MRN #:  [id number]  History of Present Illness: This is a 72 y.o. male with a history of PAD, CAD, ESRD and diabetes who presents for worsening left foot wound.  He underwent angiogram with left SFA/pop laser atherectomy and PTA balloon angioplasty approximately 3 weeks ago.  At that time he was thought to be maximally revascularized with a plan to get his wound demarcate.  He reports that he presented because the wound is not improved and began having some increased drainage.  He denies increased pain and denies fevers or chills.  He is ambulatory with a walker at home  Past Medical History:  Diagnosis Date   Arthritis    CAD (coronary artery disease)    STEMI with LAD stent by C Granger in 2005   Dependence on renal dialysis [facility])    Depression    Diabetes mellitus without complication (HCC)    History of kidney stones    Hypertension    Kidney infection    MI (mitral incompetence)    Myocardial infarction (HCC)    Seasonal allergies     Past Surgical History:  Procedure Laterality Date   ABDOMINAL AORTOGRAM W/LOWER EXTREMITY Left 02/22/2023   Procedure: ABDOMINAL AORTOGRAM W/LOWER EXTREMITY;  Surgeon: Nada Libman, MD;  Location: [facility] INVASIVE CV LAB;  Service: Cardiovascular;  Laterality: Left;   AV FISTULA PLACEMENT Left 01/30/2014   Procedure: INSERTION OF ARTERIOVENOUS (AV) GORE-TEX GRAFT LEFT UPPER ARM;  Surgeon: Sherren Kerns, MD;  Location: [facility] OR;  Service: Vascular;  Laterality: Left;   BACK SURGERY     CARDIAC CATHETERIZATION N/A 04/01/2015   Procedure: Right/Left Heart Cath and Coronary Angiography;  Surgeon: Marykay Lex, MD;  Location: [facility] INVASIVE CV LAB;  Service: Cardiovascular;  Laterality: N/A;   COLONOSCOPY N/A 10/12/2016   Procedure: COLONOSCOPY;  Surgeon: Corbin Ade, MD;  Location: [facility] ENDO SUITE;  Service: Endoscopy;  Laterality: N/A;  2:00pm    COLONOSCOPY W/ POLYPECTOMY     CORONARY ANGIOPLASTY WITH STENT PLACEMENT  2008   CORONARY ARTERY BYPASS GRAFT N/A 04/03/2015   Procedure: CORONARY ARTERY BYPASS GRAFTING (CABG) x  4 (LIMA to LAD, SVG to DIAGONAL, SVG to OM1, and SVG to RCA) with EVH from right greater saphenous thigh and partial lower leg vein ;  Surgeon: Kerin Perna, MD;  Location: [facility] OR;  Service: Open Heart Surgery;  Laterality: N/A;   EYE SURGERY Right    lens implant   INSERTION OF DIALYSIS CATHETER Right 01/30/2014   Procedure: INSERTION OF DIALYSIS CATHETER-RIGHT INTERNAL JUGULAR;  Surgeon: Sherren Kerns, MD;  Location: [facility] OR;  Service: Vascular;  Laterality: Right;   IR RADIOLOGIST EVAL & MGMT  12/30/2020   IR RADIOLOGIST EVAL & MGMT  01/08/2021   IR THORACENTESIS ASP PLEURAL SPACE W/IMG GUIDE  03/26/2022   PERIPHERAL VASCULAR ATHERECTOMY  02/22/2023   Procedure: PERIPHERAL VASCULAR ATHERECTOMY;  Surgeon: Nada Libman, MD;  Location: [facility] INVASIVE CV LAB;  Service: Cardiovascular;;  Laser SFA   POLYPECTOMY  10/12/2016   Procedure: POLYPECTOMY;  Surgeon: Corbin Ade, MD;  Location: [facility] END[facility] SUITE;  Service: Endoscopy;;  colon   SPINE SURGERY     TEE WITHOUT CARDIOVERSION N/A 04/03/2015   Procedure: TRANSESOPHAGEAL ECHOCARDIOGRAM (TEE);  Surgeon: Kerin Perna, MD;  Location: [facility] OR;  Service: Open Heart Surgery;  Laterality: N/A;    Allergies  Allergen Reactions   Shellfish Allergy Hives    Prior to Admission medications   Medication Sig Start Date End Date Taking? Authorizing Provider  acetaminophen (TYLENOL) 500 MG tablet Take 325 mg by mouth every 6 (six) hours as needed for mild pain or moderate pain.   Yes [provider]  amitriptyline (ELAVIL) 25 MG tablet Take 25 mg by mouth at bedtime. 01/21/21  Yes [provider]  aspirin EC 81 MG tablet Take 1 tablet (81 mg total) by mouth daily. Swallow whole. 03/01/23  Yes Meredeth Ide, MD  atorvastatin (LIPITOR) 80 MG tablet Take 1 tablet  (80 mg total) by mouth daily at 6 PM. 04/10/15  Yes Doree Fudge M, PA-C  brimonidine (ALPHAGAN) 0.2 % ophthalmic solution Place 1 drop into both eyes in the morning. 03/11/20  Yes [provider]  clopidogrel (PLAVIX) 75 MG tablet Take 1 tablet (75 mg total) by mouth daily with breakfast. 03/01/23  Yes Sharl Ma, Sarina Ill, MD  insulin aspart (NOVOLOG) 100 UNIT/ML injection Inject 0-9 Units into the skin 3 (three) times daily with meals. Sliding scale insulin Less than 70 initiate hypoglycemia protocol 70-120  0 units 120-150 1 unit 151-200 2 units 201-250 3 units 251-300 5 units 301-350 7 units 351-400 9 units Greater than 400 call MD 02/28/23  Yes Sharl Ma, Sarina Ill, MD  insulin glargine (LANTUS) 100 UNIT/ML injection Inject 0.1 mLs (10 Units total) into the skin at bedtime. 02/28/23  Yes Lama, Sarina Ill, MD  latanoprost (XALATAN) 0.005 % ophthalmic solution Place 2 drops into both eyes 2 (two) times daily. 08/27/16  Yes [provider]  metoprolol tartrate (LOPRESSOR) 25 MG tablet Take 0.5 tablets (12.5 mg total) by mouth 2 (two) times daily. Hold if SBP less than 110 03/10/23  Yes Conte, Tessa N, PA-C  Multiple Vitamin (DAILY VITAMIN PO) Take 1 tablet by mouth daily as needed (supplement).   Yes [provider]  ondansetron (ZOFRAN) 4 MG tablet Take 1 tablet (4 mg total) by mouth every 6 (six) hours as needed for nausea. 02/28/23  Yes Meredeth Ide, MD  oxyCODONE (OXY IR/ROXICODONE) 5 MG immediate release tablet Take 1 tablet (5 mg total) by mouth every 4 (four) hours as needed for moderate pain. 02/28/23  Yes Sharl Ma, Sarina Ill, MD  polyethylene glycol (MIRALAX / GLYCOLAX) 17 g packet Take 17 g by mouth daily. 03/01/23  Yes Meredeth Ide, MD  sevelamer carbonate (RENVELA) 800 MG tablet Take 3 tablets (2,400 mg total) by mouth 3 (three) times daily with meals. 02/28/23  Yes Meredeth Ide, MD  Misc Natural Products (BEET ROOT) 500 MG CAPS daily at 6 (six) AM. Patient not taking: Reported on  03/15/2023    [provider]  Omega-3 Fatty Acids (FISH OIL) 1000 MG CAPS Take 1,000 mg by mouth daily. Patient not taking: Reported on 03/15/2023    [provider]    Social History   Socioeconomic History   Marital status: Widowed    Spouse name: Not on file   Number of children: Not on file   Years of education: Not on file   Highest education level: Not on file  Occupational History   Not on file  Tobacco Use   Smoking status: Never   Smokeless tobacco: Former    Types: Chew    Quit date: 06/14/2010  Vaping Use   Vaping status: Never Used  Substance and Sexual Activity   Alcohol use: No   Drug use: No  Sexual activity: Not on file  Other Topics Concern   Not on file  Social History Narrative   Not on file   Social Determinants of Health   Financial Resource Strain: Not on file  Food Insecurity: No Food Insecurity (02/17/2023)   Hunger Vital Sign    Worried About Running Out of Food in the Last Year: Never true    Ran Out of Food in the Last Year: Never true  Transportation Needs: No Transportation Needs (02/17/2023)   PRAPARE - Administrator, Civil Service (Medical): No    Lack of Transportation (Non-Medical): No  Physical Activity: Not on file  Stress: Not on file  Social Connections: Not on file  Intimate Partner Violence: Not At Risk (02/17/2023)   Humiliation, Afraid, Rape, and Kick questionnaire    Fear of Current or Ex-Partner: No    Emotionally Abused: No    Physically Abused: No    Sexually Abused: No    Family History  Problem Relation Age of Onset   Diabetes Mother    Hypertension Mother    Diabetes Father    Hypertension Father    Diabetes Other     ROS: Otherwise negative unless mentioned in HPI  Physical Examination  Vitals:   03/15/23 0551 03/15/23 0743  BP: 129/73 132/73  Pulse: (!) 110 98  Resp:  17  Temp: 98.2 F (36.8 C) 98.1 F (36.7 C)  SpO2: 97% 97%   Body mass index is 22.4 kg/m.  Physical  Exam Constitutional:      General: He is not in acute distress. Cardiovascular:     Rate and Rhythm: Tachycardia present.  Pulmonary:     Effort: Pulmonary effort is normal. No respiratory distress.  Abdominal:     General: There is no distension.     Palpations: Abdomen is soft.  Neurological:     General: No focal deficit present.     Mental Status: He is alert.   Vascular: Palpable femoral pulses bilaterally.    Left, biphasic PT  Right, monophasic PT Extremities: Right foot warm dry without cyanosis or wounds.  Left foot wound see below      CBC    Component Value Date/Time   WBC 13.5 (H) 03/12/2023 2213   RBC 2.82 (L) 02/16/2023 2213   HGB 8.1 (L) 02/14/2023 2213   HCT 27.3 (L) 03/04/2023 2213   PLT 427 (H) 03/09/2023 2213   MCV 96.8 02/16/2023 2213   MCH 28.7 03/03/2023 2213   MCHC 29.7 (L) 02/22/2023 2213   RDW 17.4 (H) 02/14/2023 2213   LYMPHSABS 0.5 (L) 02/28/2023 2213   MONOABS 0.7 03/03/2023 2213   EOSABS 0.0 02/25/2023 2213   BASOSABS 0.1 02/22/2023 2213    BMET    Component Value Date/Time   NA 132 (L) 02/25/2023 2213   K 4.1 03/09/2023 2213   CL 85 (L) 02/24/2023 2213   CO2 31 02/24/2023 2213   GLUCOSE 214 (H) 02/14/2023 2213   BUN 26 (H) 02/20/2023 2213   CREATININE 4.48 (H) 03/10/2023 2213   CALCIUM 8.4 (L) 03/05/2023 2213   CALCIUM 8.3 (L) 01/14/2014 1708   GFRNONAA 13 (L) 02/28/2023 2213   GFRAA 10 (L) 07/29/2018 0024    COAGS: Lab Results  Component Value Date   INR 1.5 (H) 12/12/2020   INR 1.4 (H) 12/11/2020   INR 0.98 06/14/2016     ASSESSMENT/PLAN: This is a 72 y.o. male PAD, CAD, ESRD and diabetes who presents with worsening of  his left foot wound.  He is ambulatory with a walker at home and underwent revascularization with Dr. Myra Gianotti on 9/10 and is now maximally revascularized.  We had a lengthy discussion about his likelihood of major amputation.  He was previously told that he would need toe amputations and despite his very  unlikely chance at healing he would like to further pursue this with podiatry before proceeding to major amputation    Daria Pastures MD MS [facility] [phone] 03/15/2023  9:06 AM

## 2023-03-15 NOTE — Consult Note (Signed)
PODIATRY CONSULTATION  NAME [name] MRN [id number] DOB 1950-09-06 DOA 03/07/2023   Reason for consult: Gangrene LT foot Chief Complaint  Patient presents with   Foot Pain    Consulting physician:  Lilyan Gilford, DO    History of present illness: 72 y.o. male PMHx CAD, T2DM, PAD readmitted for worsening pain left foot.  Recent angiogram with LT SFA/pop arthrectomy and balloon angioplasty revascularization 02/22/2023 Dr. [name], vascular surgery.  From a vascular standpoint the patient was optimized and discharged 02/28/2023 with instructions to follow-up with podiatry for outpatient midfoot amputation.  Readmitted 03/15/2023 for worsening pain.  Podiatry consulted  Past Medical History:  Diagnosis Date   Arthritis    CAD (coronary artery disease)    STEMI with LAD stent by C Granger in 2005   Dependence on renal dialysis (HCC)    Depression    Diabetes mellitus without complication (HCC)    History of kidney stones    Hypertension    Kidney infection    MI (mitral incompetence)    Myocardial infarction (HCC)    Seasonal allergies        Latest Ref Rng & Units 03/11/2023   10:13 PM 02/28/2023    8:00 AM 02/25/2023    6:52 AM  CBC  WBC 4.0 - 10.5 K/uL 13.5  13.8  19.7   Hemoglobin 13.0 - 17.0 g/dL 8.1  9.6  40.9   Hematocrit 39.0 - 52.0 % 27.3  30.6  32.4   Platelets 150 - 400 K/uL 427  310  293        Latest Ref Rng & Units 02/15/2023   10:13 PM 02/28/2023    8:00 AM 02/25/2023    8:40 AM  BMP  Glucose 70 - 99 mg/dL 811  914  782   BUN 8 - 23 mg/dL 26  80  55   Creatinine 0.61 - 1.24 mg/dL 9.56  21.30  8.65   Sodium 135 - 145 mmol/L 132  124  126   Potassium 3.5 - 5.1 mmol/L 4.1  5.1  4.9   Chloride 98 - 111 mmol/L 85  86  86   CO2 22 - 32 mmol/L 31  22  23    Calcium 8.9 - 10.3 mg/dL 8.4  8.5  8.7     LT foot 03/15/2023   Physical Exam: General: The patient is alert and oriented x3 in no acute distress.   Dermatology: Please see above  noted photo.  Ischemic gangrene forefoot  Vascular: Surgery 02/22/2023. Dr. [name], VVS Impression: #1  Heavily calcified left superficial femoral and popliteal artery with multiple areas of high-grade stenosis.  I had significant difficulty crossing these lesions even with through and through access via the posterior tibial artery.  Ultimately I was able to treat the superficial femoral and above-knee popliteal artery with a 1.5 laser catheter.  I have ballooned the superficial femoral and popliteal artery with a 4 mm balloon and the tibioperoneal trunk and posterior tibial with a 3 mm balloon.  The patient now has inline flow via the posterior tibial artery down to the foot #2  Diffuse disease out onto the foot. #3  Patient has been maximally revascularized for now.  He may require additional interventions in the future for recurrent stenosis, given that he was unable to be stented today  Neurological: Diminished via light touch  Musculoskeletal Exam: Patient ambulatory.  No prior amputations.  ASSESSMENT/PLAN OF CARE 1.  Ischemic gangrene LT foot 2.  Maximally revascularized from a vascular standpoint  -Patient seen at bedside -Despite the high risk of failure due to the poor perfusion of the foot the patient would like to pursue midfoot amputation versus more proximal amputation.  This was discussed in detail with the patient.  Patient understands that if the midfoot fusion fails to heal he will likely require more proximal amputation.  All patient questions were answered.  No guarantees expressed or implied -Preoperative orders placed for surgery tentatively planned for tomorrow, 03/31/23.  Surgery will consist of left midfoot amputation with tendo Achilles lengthening. -Full breakfast.  NPO after 9am.  Tentatively scheduled for after 5 PM 2023/03/31 -Will follow   Thank you for the consult.  Please contact me directly via secure chat with any questions or concerns.     Felecia Shelling, DPM [facility]  Dr. Felecia Shelling, DPM    [address] [place], [place] [address]                Office [phone]  Fax [phone]

## 2023-03-15 NOTE — Assessment & Plan Note (Deleted)
Continue metoprolol. 

## 2023-03-15 NOTE — Progress Notes (Signed)
Pt receives out-pt HD at [facility] on MWF. Will assist as needed.   Olivia Canter Renal Navigator [phone]

## 2023-03-15 NOTE — H&P (Signed)
History and Physical    Patient: [name] VOZ:[id number] DOB: 04/21/1951 DOA: Apr 11, 2023 DOS: the patient was seen and examined on 03/15/2023 PCP: [facility]  Patient coming from: Home  Chief Complaint:  Chief Complaint  Patient presents with   Foot Pain   HPI: [name] is a 72 y.o. male with medical history significant of arthritis, coronary artery disease, depression, diabetes mellitus type 2, hypertension, and more presents the ED with a chief complaint of worsening pain in his left foot.  It is worth noting the patient was recently discharged on September 16 after hospitalization for left diabetic foot with gangrene involving 2nd through 5th digits.  At that time he had atherectomy and angioplasty of left superficial femoral-popliteal artery.  He was discharged on aspirin and Plavix.  He was discharged with instructions to follow-up with podiatry and to take Augmentin and doxycycline.  Patient reports he has been at the [facility] and he has been compliant with these medications.  He reports his infection started 6 weeks ago.  He thought it was getting better when he got discharged from the last hospitalization.  The pain started getting worse a couple of days ago.  Then started having purulent drainage that was malodorous.  His toes also turned darker.  Patient reports that he is not sure when the toe started to turn darker.  He describes the pain as sharp and achy.  He has associate decrease in appetite.  He reports nausea and vomiting x 3 on the day of presentation.  No hematemesis.  He reports no fevers.  He has no other complaints on review of systems.  Patient does not smoke and does not drink.  Patient reports that he would want to be DNR. Review of Systems: As mentioned in the history of present illness. All other systems reviewed and are negative. Past Medical History:  Diagnosis Date   Arthritis    CAD (coronary artery disease)    STEMI  with LAD stent by C Granger in 2005   Dependence on renal dialysis (HCC)    Depression    Diabetes mellitus without complication (HCC)    History of kidney stones    Hypertension    Kidney infection    MI (mitral incompetence)    Myocardial infarction (HCC)    Seasonal allergies    Past Surgical History:  Procedure Laterality Date   ABDOMINAL AORTOGRAM W/LOWER EXTREMITY Left 02/22/2023   Procedure: ABDOMINAL AORTOGRAM W/LOWER EXTREMITY;  Surgeon: Nada Libman, MD;  Location: [facility] INVASIVE CV LAB;  Service: Cardiovascular;  Laterality: Left;   AV FISTULA PLACEMENT Left 01/30/2014   Procedure: INSERTION OF ARTERIOVENOUS (AV) GORE-TEX GRAFT LEFT UPPER ARM;  Surgeon: Sherren Kerns, MD;  Location: [facility] OR;  Service: Vascular;  Laterality: Left;   BACK SURGERY     CARDIAC CATHETERIZATION N/A 04/01/2015   Procedure: Right/Left Heart Cath and Coronary Angiography;  Surgeon: Marykay Lex, MD;  Location: [facility] INVASIVE CV LAB;  Service: Cardiovascular;  Laterality: N/A;   COLONOSCOPY N/A 10/12/2016   Procedure: COLONOSCOPY;  Surgeon: Corbin Ade, MD;  Location: [facility] ENDO SUITE;  Service: Endoscopy;  Laterality: N/A;  2:00pm   COLONOSCOPY W/ POLYPECTOMY     CORONARY ANGIOPLASTY WITH STENT PLACEMENT  2008   CORONARY ARTERY BYPASS GRAFT N/A 04/03/2015   Procedure: CORONARY ARTERY BYPASS GRAFTING (CABG) x  4 (LIMA to LAD, SVG to DIAGONAL, SVG to OM1, and SVG to RCA) with EVH from right greater  saphenous thigh and partial lower leg vein ;  Surgeon: Kerin Perna, MD;  Location: [facility] OR;  Service: Open Heart Surgery;  Laterality: N/A;   EYE SURGERY Right    lens implant   INSERTION OF DIALYSIS CATHETER Right 01/30/2014   Procedure: INSERTION OF DIALYSIS CATHETER-RIGHT INTERNAL JUGULAR;  Surgeon: Sherren Kerns, MD;  Location: [facility] OR;  Service: Vascular;  Laterality: Right;   IR RADIOLOGIST EVAL & MGMT  12/30/2020   IR RADIOLOGIST EVAL & MGMT  01/08/2021   IR THORACENTESIS ASP PLEURAL SPACE W/IMG GUIDE   03/26/2022   PERIPHERAL VASCULAR ATHERECTOMY  02/22/2023   Procedure: PERIPHERAL VASCULAR ATHERECTOMY;  Surgeon: Nada Libman, MD;  Location: [facility] INVASIVE CV LAB;  Service: Cardiovascular;;  Laser SFA   POLYPECTOMY  10/12/2016   Procedure: POLYPECTOMY;  Surgeon: Corbin Ade, MD;  Location: [facility] ENDO SUITE;  Service: Endoscopy;;  colon   SPINE SURGERY     TEE WITHOUT CARDIOVERSION N/A 04/03/2015   Procedure: TRANSESOPHAGEAL ECHOCARDIOGRAM (TEE);  Surgeon: Kerin Perna, MD;  Location: [facility] OR;  Service: Open Heart Surgery;  Laterality: N/A;   Social History:  reports that he has never smoked. He quit smokeless tobacco use about 12 years ago.  His smokeless tobacco use included chew. He reports that he does not drink alcohol and does not use drugs.  Allergies  Allergen Reactions   Shellfish Allergy Hives    Family History  Problem Relation Age of Onset   Diabetes Mother    Hypertension Mother    Diabetes Father    Hypertension Father    Diabetes Other     Prior to Admission medications   Medication Sig Start Date End Date Taking? Authorizing Provider  acetaminophen (TYLENOL) 500 MG tablet Take 325 mg by mouth every 6 (six) hours as needed for mild pain or moderate pain.    [provider]  amitriptyline (ELAVIL) 25 MG tablet Take 25 mg by mouth at bedtime. 01/21/21   [provider]  aspirin EC 81 MG tablet Take 1 tablet (81 mg total) by mouth daily. Swallow whole. 03/01/23   Meredeth Ide, MD  atorvastatin (LIPITOR) 80 MG tablet Take 1 tablet (80 mg total) by mouth daily at 6 PM. 04/10/15   Ardelle Balls, PA-C  brimonidine (ALPHAGAN) 0.2 % ophthalmic solution Place 1 drop into both eyes in the morning. 03/11/20   [provider]  clopidogrel (PLAVIX) 75 MG tablet Take 1 tablet (75 mg total) by mouth daily with breakfast. 03/01/23   Meredeth Ide, MD  insulin aspart (NOVOLOG) 100 UNIT/ML injection Inject 0-9 Units into the skin 3 (three) times daily with  meals. Sliding scale insulin Less than 70 initiate hypoglycemia protocol 70-120  0 units 120-150 1 unit 151-200 2 units 201-250 3 units 251-300 5 units 301-350 7 units 351-400 9 units Greater than 400 call MD 02/28/23   Meredeth Ide, MD  insulin glargine (LANTUS) 100 UNIT/ML injection Inject 0.1 mLs (10 Units total) into the skin at bedtime. 02/28/23   Meredeth Ide, MD  latanoprost (XALATAN) 0.005 % ophthalmic solution Place 2 drops into both eyes 2 (two) times daily. 08/27/16   [provider]  metoprolol tartrate (LOPRESSOR) 25 MG tablet Take 0.5 tablets (12.5 mg total) by mouth 2 (two) times daily. Hold if SBP less than 110 03/10/23   Conte, Tessa N, PA-C  Misc Natural Products (BEET ROOT) 500 MG CAPS daily at 6 (six) AM.    [provider]  Multiple Vitamin (DAILY VITAMIN PO) Take 1 tablet by mouth daily as needed (supplement).    [provider]  Omega-3 Fatty Acids (FISH OIL) 1000 MG CAPS Take 1,000 mg by mouth daily.    [provider]  ondansetron (ZOFRAN) 4 MG tablet Take 1 tablet (4 mg total) by mouth every 6 (six) hours as needed for nausea. 02/28/23   Meredeth Ide, MD  OVER THE COUNTER MEDICATION Take 1 tablet by mouth daily. Beet Root    [provider]  oxyCODONE (OXY IR/ROXICODONE) 5 MG immediate release tablet Take 1 tablet (5 mg total) by mouth every 4 (four) hours as needed for moderate pain. 02/28/23   Meredeth Ide, MD  polyethylene glycol (MIRALAX / GLYCOLAX) 17 g packet Take 17 g by mouth daily. 03/01/23   Meredeth Ide, MD  sevelamer carbonate (RENVELA) 800 MG tablet Take 3 tablets (2,400 mg total) by mouth 3 (three) times daily with meals. 02/28/23   Meredeth Ide, MD    Physical Exam: Vitals:   03/15/23 0245 03/15/23 0400 03/15/23 0406 03/15/23 0411  BP:  104/65    Pulse: 97 93 (!) 109 98  Resp:  17  16  Temp:      TempSrc:      SpO2:  99% 100% 97%  Weight:      Height:       1.  General: Patient lying supine in bed,  no  acute distress   2. Psychiatric: Alert and oriented x 3, mood and behavior normal for situation, pleasant and cooperative with exam   3. Neurologic: Speech and language are normal, face is symmetric, moves all 4 extremities voluntarily, at baseline without acute deficits on limited exam   4. HEENMT:  Head is atraumatic, normocephalic, pupils reactive to light, neck is supple, trachea is midline, mucous membranes are moist   5. Respiratory : Lungs are clear to auscultation bilaterally without wheezing, rhonchi, rales, no cyanosis, no increase in work of breathing or accessory muscle use   6. Cardiovascular : Heart rate normal, rhythm is regular, murmur present, rubs or gallops, no peripheral edema, peripheral pulses palpated   7. Gastrointestinal:  Abdomen is soft, nondistended, nontender to palpation bowel sounds active, no masses or organomegaly palpated   8. Skin:  Left foot is black in color with sloughing the skin, toenails still in place.  Now with clean dry and intact bandage  9.Musculoskeletal:  No acute deformities or trauma, no asymmetry in tone, no peripheral edema, peripheral pulses palpated, no tenderness to palpation in the extremities  Data Reviewed: In the ED he Temp 97.6/98.3, heart rate 97-117, respiratory rate 20, blood pressure 131/74-147/76, satting at 94% Leukocytosis at 13.5, hemoglobin 8.1, platelets 427 Chemistry reveals a elevated creatinine in this ESRD patient at 4.48 GFR is 13 which is up from 5 X-ray left foot shows soft tissue thinning and equal osteolysis demonstrated in the second third fourth fifth toes similar to previous study Patient was started on cefepime and vancomycin Admission requested for worsening gangrene with purulent and malodorous drainage  Assessment and Plan: * Gangrene of left foot (HCC) - Holding aspirin and Plavix in case of surgery - Recently admitted for same, reports compliance with antibiotic at the [facility] -  Previously on Vanco and Zosyn, discharged on Augmentin and doxycycline - Vancomycin and cefepime started in the ED, continue per pharmacy consult - Leukocytosis at 13.5 - Status post neurectomy and angioplasty of left superficial femoral-popliteal  artery from last admission - Transfer to [facility] where he can be evaluated by vascular - Patient had follow-up with podiatry today, it may be possible that podiatry could follow-up with him in the hospital?  We do not have podiatry here at [facility] - Continue to monitor  DM (diabetes mellitus) (HCC) - 10 units of basal insulin at baseline - Hold basal insulin while in the hospital - Sliding scale coverage - Currently n.p.o. in case of surgery - Monitor CBG  Coronary artery disease involving native coronary artery of native heart without angina pectoris - Holding aspirin and Plavix in the setting of possible upcoming amputation - Continue statin, continue beta-blocker - Continue to monitor  ESRD (end stage renal disease) (HCC) - Continue Renvela - Consult nephrology - Defer fluid and electrolyte management to nephrology  Essential (primary) hypertension - Continue metoprolol  HLD (hyperlipidemia) - Continue statin      Advance Care Planning:   Code Status: Limited: Do not attempt resuscitation (DNR) -DNR-LIMITED -Do Not Intubate/DNI   Consults: Podiatry and nephrology  Family Communication: No family at bedside  Severity of Illness: The appropriate patient status for this patient is INPATIENT. Inpatient status is judged to be reasonable and necessary in order to provide the required intensity of service to ensure the patient's safety. The patient's presenting symptoms, physical exam findings, and initial radiographic and laboratory data in the context of their chronic comorbidities is felt to place them at high risk for further clinical deterioration. Furthermore, it is not anticipated that the patient will be medically stable  for discharge from the hospital within 2 midnights of admission.   * I certify that at the point of admission it is my clinical judgment that the patient will require inpatient hospital care spanning beyond 2 midnights from the point of admission due to high intensity of service, high risk for further deterioration and high frequency of surveillance required.*  Author: Lilyan Gilford, DO 03/15/2023 5:28 AM  For on call review [email or web address].

## 2023-03-15 NOTE — H&P (View-Only) (Signed)
PODIATRY CONSULTATION  NAME [name] MRN [id number] DOB 1950-09-06 DOA 03/07/2023   Reason for consult: Gangrene LT foot Chief Complaint  Patient presents with   Foot Pain    Consulting physician:  Lilyan Gilford, DO    History of present illness: 72 y.o. male PMHx CAD, T2DM, PAD readmitted for worsening pain left foot.  Recent angiogram with LT SFA/pop arthrectomy and balloon angioplasty revascularization 02/22/2023 Dr. [name], vascular surgery.  From a vascular standpoint the patient was optimized and discharged 02/28/2023 with instructions to follow-up with podiatry for outpatient midfoot amputation.  Readmitted 03/15/2023 for worsening pain.  Podiatry consulted  Past Medical History:  Diagnosis Date   Arthritis    CAD (coronary artery disease)    STEMI with LAD stent by C Granger in 2005   Dependence on renal dialysis (HCC)    Depression    Diabetes mellitus without complication (HCC)    History of kidney stones    Hypertension    Kidney infection    MI (mitral incompetence)    Myocardial infarction (HCC)    Seasonal allergies        Latest Ref Rng & Units 03/11/2023   10:13 PM 02/28/2023    8:00 AM 02/25/2023    6:52 AM  CBC  WBC 4.0 - 10.5 K/uL 13.5  13.8  19.7   Hemoglobin 13.0 - 17.0 g/dL 8.1  9.6  40.9   Hematocrit 39.0 - 52.0 % 27.3  30.6  32.4   Platelets 150 - 400 K/uL 427  310  293        Latest Ref Rng & Units 02/15/2023   10:13 PM 02/28/2023    8:00 AM 02/25/2023    8:40 AM  BMP  Glucose 70 - 99 mg/dL 811  914  782   BUN 8 - 23 mg/dL 26  80  55   Creatinine 0.61 - 1.24 mg/dL 9.56  21.30  8.65   Sodium 135 - 145 mmol/L 132  124  126   Potassium 3.5 - 5.1 mmol/L 4.1  5.1  4.9   Chloride 98 - 111 mmol/L 85  86  86   CO2 22 - 32 mmol/L 31  22  23    Calcium 8.9 - 10.3 mg/dL 8.4  8.5  8.7     LT foot 03/15/2023   Physical Exam: General: The patient is alert and oriented x3 in no acute distress.   Dermatology: Please see above  noted photo.  Ischemic gangrene forefoot  Vascular: Surgery 02/22/2023. Dr. [name], VVS Impression: #1  Heavily calcified left superficial femoral and popliteal artery with multiple areas of high-grade stenosis.  I had significant difficulty crossing these lesions even with through and through access via the posterior tibial artery.  Ultimately I was able to treat the superficial femoral and above-knee popliteal artery with a 1.5 laser catheter.  I have ballooned the superficial femoral and popliteal artery with a 4 mm balloon and the tibioperoneal trunk and posterior tibial with a 3 mm balloon.  The patient now has inline flow via the posterior tibial artery down to the foot #2  Diffuse disease out onto the foot. #3  Patient has been maximally revascularized for now.  He may require additional interventions in the future for recurrent stenosis, given that he was unable to be stented today  Neurological: Diminished via light touch  Musculoskeletal Exam: Patient ambulatory.  No prior amputations.  ASSESSMENT/PLAN OF CARE 1.  Ischemic gangrene LT foot 2.  Maximally revascularized from a vascular standpoint  -Patient seen at bedside -Despite the high risk of failure due to the poor perfusion of the foot the patient would like to pursue midfoot amputation versus more proximal amputation.  This was discussed in detail with the patient.  Patient understands that if the midfoot fusion fails to heal he will likely require more proximal amputation.  All patient questions were answered.  No guarantees expressed or implied -Preoperative orders placed for surgery tentatively planned for tomorrow, 03/31/23.  Surgery will consist of left midfoot amputation with tendo Achilles lengthening. -Full breakfast.  NPO after 9am.  Tentatively scheduled for after 5 PM 2023/03/31 -Will follow   Thank you for the consult.  Please contact me directly via secure chat with any questions or concerns.     Felecia Shelling, DPM [facility]  Dr. Felecia Shelling, DPM    [address] [place], [place] [address]                Office [phone]  Fax [phone]

## 2023-03-15 NOTE — Consult Note (Signed)
Renal Service Consult Note [facility]  [name] 03/15/2023 Maree Krabbe, MD Requesting Physician: Dr. Elvera Lennox  Reason for Consult: ESRD pt w/  HPI: The patient is a 72 y.o. year-old w/ PMH as below who presented to hospital yesterday w/ c/o pain and drainage from L foot, and purulent drainage. Recently here in September when [facility] did perc revasc of that leg. Pt was admitted and started on IV abx. We are asked to see for dialysis.   Pt seen in room.  Goes to [facility] in [place]. No c/o's at this time.   ROS - denies CP, no joint pain, no HA, no blurry vision, no rash, no diarrhea, no nausea/ vomiting, no dysuria, no difficulty voiding   Past Medical History  Past Medical History:  Diagnosis Date   Arthritis    CAD (coronary artery disease)    STEMI with LAD stent by C Granger in 2005   Dependence on renal dialysis (HCC)    Depression    Diabetes mellitus without complication (HCC)    History of kidney stones    Hypertension    Kidney infection    MI (mitral incompetence)    Myocardial infarction (HCC)    Seasonal allergies    Past Surgical History  Past Surgical History:  Procedure Laterality Date   ABDOMINAL AORTOGRAM W/LOWER EXTREMITY Left 02/22/2023   Procedure: ABDOMINAL AORTOGRAM W/LOWER EXTREMITY;  Surgeon: Nada Libman, MD;  Location: [facility] INVASIVE CV LAB;  Service: Cardiovascular;  Laterality: Left;   AV FISTULA PLACEMENT Left 01/30/2014   Procedure: INSERTION OF ARTERIOVENOUS (AV) GORE-TEX GRAFT LEFT UPPER ARM;  Surgeon: Sherren Kerns, MD;  Location: [facility] OR;  Service: Vascular;  Laterality: Left;   BACK SURGERY     CARDIAC CATHETERIZATION N/A 04/01/2015   Procedure: Right/Left Heart Cath and Coronary Angiography;  Surgeon: Marykay Lex, MD;  Location: [facility] INVASIVE CV LAB;  Service: Cardiovascular;  Laterality: N/A;   COLONOSCOPY N/A 10/12/2016   Procedure: COLONOSCOPY;  Surgeon: Corbin Ade, MD;  Location: [facility] ENDO SUITE;   Service: Endoscopy;  Laterality: N/A;  2:00pm   COLONOSCOPY W/ POLYPECTOMY     CORONARY ANGIOPLASTY WITH STENT PLACEMENT  2008   CORONARY ARTERY BYPASS GRAFT N/A 04/03/2015   Procedure: CORONARY ARTERY BYPASS GRAFTING (CABG) x  4 (LIMA to LAD, SVG to DIAGONAL, SVG to OM1, and SVG to RCA) with EVH from right greater saphenous thigh and partial lower leg vein ;  Surgeon: Kerin Perna, MD;  Location: [facility] OR;  Service: Open Heart Surgery;  Laterality: N/A;   EYE SURGERY Right    lens implant   INSERTION OF DIALYSIS CATHETER Right 01/30/2014   Procedure: INSERTION OF DIALYSIS CATHETER-RIGHT INTERNAL JUGULAR;  Surgeon: Sherren Kerns, MD;  Location: [facility] OR;  Service: Vascular;  Laterality: Right;   IR RADIOLOGIST EVAL & MGMT  12/30/2020   IR RADIOLOGIST EVAL & MGMT  01/08/2021   IR THORACENTESIS ASP PLEURAL SPACE W/IMG GUIDE  03/26/2022   PERIPHERAL VASCULAR ATHERECTOMY  02/22/2023   Procedure: PERIPHERAL VASCULAR ATHERECTOMY;  Surgeon: Nada Libman, MD;  Location: [facility] INVASIVE CV LAB;  Service: Cardiovascular;;  Laser SFA   POLYPECTOMY  10/12/2016   Procedure: POLYPECTOMY;  Surgeon: Corbin Ade, MD;  Location: [facility] ENDO [facility]ITE;  Service: Endoscopy;;  colon   SPINE SURGERY     TEE WITHOUT CARDIOVERSION N/A 04/03/2015   Procedure: TRANSESOPHAGEAL ECHOCARDIOGRAM (TEE);  Surgeon: Kerin Perna, MD;  Location: [facility] OR;  Service:  Open Heart Surgery;  Laterality: N/A;   Family History  Family History  Problem Relation Age of Onset   Diabetes Mother    Hypertension Mother    Diabetes Father    Hypertension Father    Diabetes Other    Social History  reports that he has never smoked. He quit smokeless tobacco use about 12 years ago.  His smokeless tobacco use included chew. He reports that he does not drink alcohol and does not use drugs. Allergies  Allergies  Allergen Reactions   Shellfish Allergy Hives   Home medications Prior to Admission medications   Medication Sig Start Date End Date  Taking? Authorizing Provider  acetaminophen (TYLENOL) 500 MG tablet Take 325 mg by mouth every 6 (six) hours as needed for mild pain or moderate pain.   Yes [provider]  amitriptyline (ELAVIL) 25 MG tablet Take 25 mg by mouth at bedtime. 01/21/21  Yes [provider]  aspirin EC 81 MG tablet Take 1 tablet (81 mg total) by mouth daily. Swallow whole. 03/01/23  Yes Meredeth Ide, MD  atorvastatin (LIPITOR) 80 MG tablet Take 1 tablet (80 mg total) by mouth daily at 6 PM. 04/10/15  Yes Doree Fudge M, PA-C  brimonidine (ALPHAGAN) 0.2 % ophthalmic solution Place 1 drop into both eyes in the morning. 03/11/20  Yes [provider]  clopidogrel (PLAVIX) 75 MG tablet Take 1 tablet (75 mg total) by mouth daily with breakfast. 03/01/23  Yes Sharl Ma, Sarina Ill, MD  insulin aspart (NOVOLOG) 100 UNIT/ML injection Inject 0-9 Units into the skin 3 (three) times daily with meals. Sliding scale insulin Less than 70 initiate hypoglycemia protocol 70-120  0 units 120-150 1 unit 151-200 2 units 201-250 3 units 251-300 5 units 301-350 7 units 351-400 9 units Greater than 400 call MD 02/28/23  Yes Sharl Ma, Sarina Ill, MD  insulin glargine (LANTUS) 100 UNIT/ML injection Inject 0.1 mLs (10 Units total) into the skin at bedtime. 02/28/23  Yes Lama, Sarina Ill, MD  latanoprost (XALATAN) 0.005 % ophthalmic solution Place 2 drops into both eyes 2 (two) times daily. 08/27/16  Yes [provider]  metoprolol tartrate (LOPRESSOR) 25 MG tablet Take 0.5 tablets (12.5 mg total) by mouth 2 (two) times daily. Hold if SBP less than 110 03/10/23  Yes Conte, Tessa N, PA-C  Multiple Vitamin (DAILY VITAMIN PO) Take 1 tablet by mouth daily as needed (supplement).   Yes [provider]  ondansetron (ZOFRAN) 4 MG tablet Take 1 tablet (4 mg total) by mouth every 6 (six) hours as needed for nausea. 02/28/23  Yes Meredeth Ide, MD  oxyCODONE (OXY IR/ROXICODONE) 5 MG immediate release tablet Take 1 tablet (5 mg total)  by mouth every 4 (four) hours as needed for moderate pain. 02/28/23  Yes Sharl Ma, Sarina Ill, MD  polyethylene glycol (MIRALAX / GLYCOLAX) 17 g packet Take 17 g by mouth daily. 03/01/23  Yes Meredeth Ide, MD  sevelamer carbonate (RENVELA) 800 MG tablet Take 3 tablets (2,400 mg total) by mouth 3 (three) times daily with meals. 02/28/23  Yes Meredeth Ide, MD  Misc Natural Products (BEET ROOT) 500 MG CAPS daily at 6 (six) AM. Patient not taking: Reported on 03/15/2023    [provider]  Omega-3 Fatty Acids (FISH OIL) 1000 MG CAPS Take 1,000 mg by mouth daily. Patient not taking: Reported on 03/15/2023    [provider]     Vitals:   03/15/23 0411 03/15/23 1610 03/15/23 9604  03/15/23 0743  BP:  129/73 129/73 132/73  Pulse: 98 97 (!) 110 98  Resp: 16 20  17   Temp:  98.2 F (36.8 C) 98.2 F (36.8 C) 98.1 F (36.7 C)  TempSrc:  Oral Oral Oral  SpO2: 97% 95% 97% 97%  Weight:      Height:       Exam Gen alert, no distress No rash, cyanosis or gangrene Sclera anicteric, throat clear  No jvd or bruits Chest clear bilat to bases, no rales/ wheezing RRR no MRG Abd soft ntnd no mass or ascites +bs GU defer MS no joint effusions or deformity Ext no LE or UE edema, L foot is wrapped Neuro is alert, Ox 3 , nf    LUE AVG+bruit     Renal-related home meds: - metoprolol 12.5 bid - renvela 3 ac tid    OP HD: MWF at [facility]ayne Bunting) 4:15h    500/500     82kg    2K/2Ca    AVG   Heparin 2000 - sensipar 180 three times per week - hectorol 4 mcg IV - mircera 150 mcg q 2 wks, new order   Assessment/ Plan: Left foot gangrene - IV abx started. Per pmd.  ESRD - on HD MWF. Next HD tomorrow.  HTN - BP's wnl here, cont home HTN medication.  Volume - no sig vol excess on exam. Up 2 kg, UF goal the same.  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 Is supposed to started new esa, will start here with darbe 150 mcg sq weekly while here.  MBD ckd - Ca in range, add alb/ phos. Resume IV  vdra and po sensipar w/ HD. Cont binder w/ meals.  DM2 - per pmd      Vinson Moselle  MD [facility] 03/15/2023, 2:46 PM  Recent Labs  Lab 03/02/2023 2213  HGB 8.1*  CALCIUM 8.4*  CREATININE 4.48*  K 4.1   Inpatient medications:  atorvastatin  80 mg Oral q1800   brimonidine  1 drop Both Eyes Daily   insulin aspart  0-15 Units Subcutaneous TID WC   insulin aspart  0-5 Units Subcutaneous QHS   latanoprost  2 drop Both Eyes BID   metoprolol tartrate  12.5 mg Oral BID   sevelamer carbonate  2,400 mg Oral TID WC    [START ON 03-22-23] ceFEPime (MAXIPIME) IV     [START ON 22-Mar-2023] vancomycin     acetaminophen **OR** acetaminophen, morphine injection, ondansetron **OR** ondansetron (ZOFRAN) IV, oxyCODONE

## 2023-03-15 NOTE — Progress Notes (Signed)
Pharmacy Antibiotic Note  [name] is a 72 y.o. male admitted on 2023/04/11 with worsening gangrene.  Pharmacy has been consulted for vancomycin and cefepime dosing.  Plan: Rec'd vancomycin 1000mg  in ED; will give an additional vanc 1g for total load of 2g then continue with 1g IV every HD.  Goal pre-HD level 15-25 mcg/mL. Rec'd cefepime 2g in ED; continue with 1g IV Q24H.  Height: 6\' 4"  (193 cm) Weight: 83.5 kg (184 lb) IBW/kg (Calculated) : 86.8  Temp (24hrs), Avg:98 F (36.7 C), Min:97.6 F (36.4 C), Max:98.3 F (36.8 C)  Recent Labs  Lab 04-11-23 2213  WBC 13.5*  CREATININE 4.48*    Estimated Creatinine Clearance: 17.9 mL/min (A) (by C-G formula based on SCr of 4.48 mg/dL (H)).    Allergies  Allergen Reactions   Shellfish Allergy Hives    Thank you for allowing pharmacy to be a part of this patient's care.  Vernard Gambles, PharmD, BCPS  03/15/2023 5:47 AM

## 2023-03-15 NOTE — Assessment & Plan Note (Signed)
Continue statin. 

## 2023-03-15 NOTE — Assessment & Plan Note (Signed)
-   Continue Renvela - Consult nephrology - Defer fluid and electrolyte management to nephrology

## 2023-03-15 NOTE — Assessment & Plan Note (Signed)
-   10 units of basal insulin at baseline - Hold basal insulin while in the hospital - Sliding scale coverage - Currently n.p.o. in case of surgery - Monitor CBG

## 2023-03-15 NOTE — Telephone Encounter (Signed)
Rounding this evening.-Dr. Logan Bores

## 2023-03-15 NOTE — Telephone Encounter (Signed)
Received call from a male stating that pt is in the hospital. They asked about you seeing pt because he is in there for his foot.  I explained to her that the provider seeing pt in the hospital would need to consult Dr Logan Bores.But I would let Dr Logan Bores know.

## 2023-03-15 NOTE — Assessment & Plan Note (Signed)
-   Holding aspirin and Plavix in case of surgery - Recently admitted for same, reports compliance with antibiotic at the [facility] - Previously on Vanco and Zosyn, discharged on Augmentin and doxycycline - Vancomycin and cefepime started in the ED, continue per pharmacy consult - Leukocytosis at 13.5 - Status post neurectomy and angioplasty of left superficial femoral-popliteal artery from last admission - Transfer to [facility] where he can be evaluated by vascular - Patient had follow-up with podiatry today, it may be possible that podiatry could follow-up with him in the hospital?  We do not have podiatry here at [facility] - Continue to monitor

## 2023-03-15 NOTE — Assessment & Plan Note (Addendum)
- 

## 2023-03-15 NOTE — ED Provider Notes (Signed)
[place] EMERGENCY DEPARTMENT AT [facility] Provider Note   CSN: [id number] Arrival date & time: 2023-04-03  2129     History  Chief Complaint  Patient presents with   Foot Pain    [name] is a 72 y.o. male.  The history is provided by the patient.  Patient with extensive history including CAD, hypertension, ESRD presents with left foot pain and concern for infection.  Patient reports he was recently admitted for this issue, but was discharged home.  He is supposed to follow-up with surgeon on October 1 but due to worsening symptoms he came to the ER No fevers are reported.  No recent trauma.    Past Medical History:  Diagnosis Date   Arthritis    CAD (coronary artery disease)    STEMI with LAD stent by C Granger in 2005   Dependence on renal dialysis (HCC)    Depression    Diabetes mellitus without complication (HCC)    History of kidney stones    Hypertension    Kidney infection    MI (mitral incompetence)    Myocardial infarction (HCC)    Seasonal allergies     Home Medications Prior to Admission medications   Medication Sig Start Date End Date Taking? Authorizing Provider  acetaminophen (TYLENOL) 500 MG tablet Take 325 mg by mouth every 6 (six) hours as needed for mild pain or moderate pain.    [provider]  amitriptyline (ELAVIL) 25 MG tablet Take 25 mg by mouth at bedtime. 01/21/21   [provider]  aspirin EC 81 MG tablet Take 1 tablet (81 mg total) by mouth daily. Swallow whole. 03/01/23   Meredeth Ide, MD  atorvastatin (LIPITOR) 80 MG tablet Take 1 tablet (80 mg total) by mouth daily at 6 PM. 04/10/15   Ardelle Balls, PA-C  brimonidine (ALPHAGAN) 0.2 % ophthalmic solution Place 1 drop into both eyes in the morning. 03/11/20   [provider]  clopidogrel (PLAVIX) 75 MG tablet Take 1 tablet (75 mg total) by mouth daily with breakfast. 03/01/23   Meredeth Ide, MD  insulin aspart (NOVOLOG) 100 UNIT/ML  injection Inject 0-9 Units into the skin 3 (three) times daily with meals. Sliding scale insulin Less than 70 initiate hypoglycemia protocol 70-120  0 units 120-150 1 unit 151-200 2 units 201-250 3 units 251-300 5 units 301-350 7 units 351-400 9 units Greater than 400 call MD 02/28/23   Meredeth Ide, MD  insulin glargine (LANTUS) 100 UNIT/ML injection Inject 0.1 mLs (10 Units total) into the skin at bedtime. 02/28/23   Meredeth Ide, MD  latanoprost (XALATAN) 0.005 % ophthalmic solution Place 2 drops into both eyes 2 (two) times daily. 08/27/16   [provider]  metoprolol tartrate (LOPRESSOR) 25 MG tablet Take 0.5 tablets (12.5 mg total) by mouth 2 (two) times daily. Hold if SBP less than 110 03/10/23   Conte, Tessa N, PA-C  Misc Natural Products (BEET ROOT) 500 MG CAPS daily at 6 (six) AM.    [provider]  Multiple Vitamin (DAILY VITAMIN PO) Take 1 tablet by mouth daily as needed (supplement).    [provider]  Omega-3 Fatty Acids (FISH OIL) 1000 MG CAPS Take 1,000 mg by mouth daily.    [provider]  ondansetron (ZOFRAN) 4 MG tablet Take 1 tablet (4 mg total) by mouth every 6 (six) hours as needed for nausea. 02/28/23   Meredeth Ide, MD  OVER THE  COUNTER MEDICATION Take 1 tablet by mouth daily. Beet Root    [provider]  oxyCODONE (OXY IR/ROXICODONE) 5 MG immediate release tablet Take 1 tablet (5 mg total) by mouth every 4 (four) hours as needed for moderate pain. 02/28/23   Meredeth Ide, MD  polyethylene glycol (MIRALAX / GLYCOLAX) 17 g packet Take 17 g by mouth daily. 03/01/23   Meredeth Ide, MD  sevelamer carbonate (RENVELA) 800 MG tablet Take 3 tablets (2,400 mg total) by mouth 3 (three) times daily with meals. 02/28/23   Meredeth Ide, MD      Allergies    Shellfish allergy    Review of Systems   Review of Systems  Constitutional:  Negative for fever.  Skin:  Positive for wound.    Physical Exam Updated Vital Signs BP (!) 147/74    Pulse 97   Temp 97.6 F (36.4 C) (Oral)   Resp 20   Ht 1.93 m (6\' 4" )   Wt 83.5 kg   SpO2 94%   BMI 22.40 kg/m  Physical Exam CONSTITUTIONAL: Elderly, no acute distress HEAD: Normocephalic/atraumatic ENMT: Mucous membranes moist NECK: supple no meningeal signs SPINE/BACK:entire spine nontender CV: S1/S2 noted, murmur noted LUNGS: Lungs are clear to auscultation bilaterally, no apparent distress ABDOMEN: soft, nontender NEURO: Pt is awake/alert/appropriate, moves all extremitiesx4.  No facial droop.   EXTREMITIES: Dialysis access left arm with thrill noted Gangrenous changes noted to his left foot, foul smell is noted.   The left foot is warm to touch. See photo SKIN: See photo PSYCH: no abnormalities of mood noted, alert and oriented to situation     ED Results / Procedures / Treatments   Labs (all labs ordered are listed, but only abnormal results are displayed) Labs Reviewed  CBC WITH DIFFERENTIAL/PLATELET - Abnormal; Notable for the following components:      Result Value   WBC 13.5 (*)    RBC 2.82 (*)    Hemoglobin 8.1 (*)    HCT 27.3 (*)    MCHC 29.7 (*)    RDW 17.4 (*)    Platelets 427 (*)    Neutro Abs 12.1 (*)    Lymphs Abs 0.5 (*)    All other components within normal limits  BASIC METABOLIC PANEL - Abnormal; Notable for the following components:   Sodium 132 (*)    Chloride 85 (*)    Glucose, Bld 214 (*)    BUN 26 (*)    Creatinine, Ser 4.48 (*)    Calcium 8.4 (*)    GFR, Estimated 13 (*)    Anion gap 16 (*)    All other components within normal limits    EKG None  Radiology DG Foot Complete Left  Result Date: 03/15/2023 CLINICAL DATA:  Pain with possible infection. Necrosis in the second through the fifth digits with open wounds. EXAM: LEFT FOOT - COMPLETE 3+ VIEW COMPARISON:  02/17/2023 FINDINGS: Severe soft tissue thinning and tuft erosions again demonstrated in the second, third, fourth, and fifth toes. Similar appearance to previous study.  Old healed fracture deformity of the fifth metatarsal bone. No acute fracture or dislocation. No radiopaque soft tissue foreign bodies or soft tissue gas. Vascular calcifications. IMPRESSION: Soft tissue thinning and acral osteolysis demonstrated in the second, third, fourth, and fifth toes. Similar appearance to previous study. Electronically Signed   By: Burman Nieves M.D.   On: 03/15/2023 00:20    Procedures Procedures    Medications Ordered in ED Medications  vancomycin (VANCOCIN) IVPB 1000 mg/200 mL premix (has no administration in time range)  ceFEPIme (MAXIPIME) 2 g in sodium chloride 0.9 % 100 mL IVPB (0 g Intravenous Stopped 03/15/23 0343)  fentaNYL (SUBLIMAZE) injection 50 mcg (50 mcg Intravenous Given 03/15/23 0301)    ED Course/ Medical Decision Making/ A&P Clinical Course as of 03/15/23 0351  Tue Mar 15, 2023  0214 WBC(!): 13.5 Leukocytosis [DW]  0215 Glucose(!): 214 Hyperglycemia [DW]  0215 Creatinine(!): 4.48 Renal failure [DW]  0315 Patient with obvious gangrenous left foot.  It is worse when compared to recent imaging.  Patient was recently admitted for this and underwent vascular procedure and laser atherectomy.  It appears the wounds are worsening.  Will start antibiotics and admit Patient reports last dialysis was on September 30 [DW]  0350 Discussed with Dr. Dorthula Perfect with [facility].  Patient will be admitted.  He will likely be transferred to [facility] where can be seen by specialist including podiatry and vascular. Given the progression of his illness and difficulty with transportation, I feel that admission and definitive management is warranted [DW]    Clinical Course User Index [DW] Zadie Rhine, MD                                 Medical Decision Making Amount and/or Complexity of Data Reviewed Labs: ordered. Decision-making details documented in ED Course. Radiology: ordered.  Risk Prescription drug management. Decision regarding  hospitalization.   This patient presents to the ED for concern of foot pain, this involves an extensive number of treatment options, and is a complaint that carries with it a high risk of complications and morbidity.  The differential diagnosis includes but is not limited to cellulitis, abscess, gangrene, necrotizing fasciitis  Comorbidities that complicate the patient evaluation: Patient's presentation is complicated by their history of end-stage renal disease, peripheral vascular disease  Social Determinants of Health: Patient's  poor health literacy   increases the complexity of managing their presentation  Additional history obtained: Records reviewed previous admission documents  Lab Tests: I Ordered, and personally interpreted labs.  The pertinent results include: Leukocytosis, end-stage renal disease  Imaging Studies ordered: I ordered imaging studies including X-ray foot   I independently visualized and interpreted imaging which showed chronic findings I agree with the radiologist interpretation   Medicines ordered and prescription drug management: I ordered medication including cefepime and vancomycin for gangrenous left foot Reevaluation of the patient after these medicines showed that the patient    stayed the same  Critical Interventions:   IV antibiotics and admission  Consultations Obtained: I requested consultation with the admitting physician [facility] , and discussed  findings as well as pertinent plan - they recommend: Admit  Reevaluation: After the interventions noted above, I reevaluated the patient and found that they have :stayed the same  Complexity of problems addressed: Patient's presentation is most consistent with  acute presentation with potential threat to life or bodily function  Disposition: After consideration of the diagnostic results and the patient's response to treatment,  I feel that the patent would benefit from admission   .            Final Clinical Impression(s) / ED Diagnoses Final diagnoses:  Gangrene (HCC)  ESRD (end stage renal disease) (HCC)    Rx / DC Orders ED Discharge Orders     None         Zadie Rhine, MD 03/15/23  3086

## 2023-03-15 NOTE — Progress Notes (Signed)
No charge note  Patient seen and examined this morning, admitted earlier today.  This is 72 year old male with history of PAD, CAD, ESRD, DM 2, HTN who comes into the hospital with worsening pain in his left foot and worsening drainage.  He was recently hospitalized early September with diabetic foot gangrene on the left side.  Vascular surgery was consulted and he had atherectomy and angioplasty of the femoral, popliteal and tibial arteries by Dr. Myra Gianotti on 9/10.  He was sent home on antibiotics.  He then reports started having purulent drainage, foul-smelling and toes turned darker.  Principal problem Left foot gangrene-vascular surgery consulted, appreciate input.  Hold aspirin and Plavix until he is seen.  He was started on antibiotics for white count of 13.5, however he is nontoxic-appearing  Active problems CAD-continue statin, beta-blocker.  No chest pain  ESRD-nephrology consulted  Essential hypertension-continue metoprolol  Hyperlipidemia-continue statin  Type 2 diabetes mellitus-placed on sliding scale  CBG (last 3)  Recent Labs    03/15/23 0741  GLUCAP 178*   Scheduled Meds:  atorvastatin  80 mg Oral q1800   brimonidine  1 drop Both Eyes Daily   insulin aspart  0-15 Units Subcutaneous TID WC   insulin aspart  0-5 Units Subcutaneous QHS   latanoprost  2 drop Both Eyes BID   metoprolol tartrate  12.5 mg Oral BID   sevelamer carbonate  2,400 mg Oral TID WC   Continuous Infusions:  [START ON 04/12/2023] ceFEPime (MAXIPIME) IV     [START ON 04/08/2023] vancomycin     PRN Meds:.acetaminophen **OR** acetaminophen, morphine injection, ondansetron **OR** ondansetron (ZOFRAN) IV, oxyCODONE   [name] M. Elvera Lennox, MD, PhD [facility]  Between 7 am - 7 pm you can contact me via Amion (for emergencies) or Securechat (non urgent matters).  I am not available 7 pm - 7 am, please contact night coverage MD/APP via Amion

## 2023-03-15 DEATH — deceased

## 2023-03-16 ENCOUNTER — Encounter (HOSPITAL_COMMUNITY): Payer: Self-pay | Admitting: Family Medicine

## 2023-03-16 ENCOUNTER — Encounter (HOSPITAL_COMMUNITY): Admission: EM | Disposition: E | Payer: Self-pay | Source: Home / Self Care | Attending: Internal Medicine

## 2023-03-16 ENCOUNTER — Inpatient Hospital Stay (HOSPITAL_COMMUNITY): Payer: Medicare Other

## 2023-03-16 ENCOUNTER — Inpatient Hospital Stay (HOSPITAL_COMMUNITY): Payer: Medicare Other | Admitting: Anesthesiology

## 2023-03-16 ENCOUNTER — Other Ambulatory Visit: Payer: Self-pay

## 2023-03-16 DIAGNOSIS — I96 Gangrene, not elsewhere classified: Secondary | ICD-10-CM

## 2023-03-16 DIAGNOSIS — M869 Osteomyelitis, unspecified: Secondary | ICD-10-CM

## 2023-03-16 DIAGNOSIS — L089 Local infection of the skin and subcutaneous tissue, unspecified: Secondary | ICD-10-CM | POA: Diagnosis not present

## 2023-03-16 DIAGNOSIS — N186 End stage renal disease: Secondary | ICD-10-CM

## 2023-03-16 DIAGNOSIS — E1169 Type 2 diabetes mellitus with other specified complication: Secondary | ICD-10-CM | POA: Diagnosis not present

## 2023-03-16 DIAGNOSIS — I120 Hypertensive chronic kidney disease with stage 5 chronic kidney disease or end stage renal disease: Secondary | ICD-10-CM | POA: Diagnosis not present

## 2023-03-16 DIAGNOSIS — E11628 Type 2 diabetes mellitus with other skin complications: Secondary | ICD-10-CM | POA: Diagnosis not present

## 2023-03-16 HISTORY — PX: AMPUTATION: SHX166

## 2023-03-16 HISTORY — PX: ACHILLES TENDON LENGTHENING: SHX6455

## 2023-03-16 LAB — COMPREHENSIVE METABOLIC PANEL
ALT: 16 U/L (ref 0–44)
AST: 17 U/L (ref 15–41)
Albumin: 1.8 g/dL — ABNORMAL LOW (ref 3.5–5.0)
Alkaline Phosphatase: 98 U/L (ref 38–126)
Anion gap: 13 (ref 5–15)
BUN: 35 mg/dL — ABNORMAL HIGH (ref 8–23)
CO2: 27 mmol/L (ref 22–32)
Calcium: 7.8 mg/dL — ABNORMAL LOW (ref 8.9–10.3)
Chloride: 90 mmol/L — ABNORMAL LOW (ref 98–111)
Creatinine, Ser: 6.79 mg/dL — ABNORMAL HIGH (ref 0.61–1.24)
GFR, Estimated: 8 mL/min — ABNORMAL LOW (ref >60)
Glucose, Bld: 147 mg/dL — ABNORMAL HIGH (ref 70–99)
Potassium: 4.2 mmol/L (ref 3.5–5.1)
Sodium: 130 mmol/L — ABNORMAL LOW (ref 135–145)
Total Bilirubin: 0.7 mg/dL (ref 0.3–1.2)
Total Protein: 5.8 g/dL — ABNORMAL LOW (ref 6.5–8.1)

## 2023-03-16 LAB — CBC WITH DIFFERENTIAL/PLATELET
Abs Immature Granulocytes: 0.09 10*3/uL — ABNORMAL HIGH (ref 0.00–0.07)
Basophils Absolute: 0.1 10*3/uL (ref 0.0–0.1)
Basophils Relative: 1 %
Eosinophils Absolute: 0.3 10*3/uL (ref 0.0–0.5)
Eosinophils Relative: 3 %
HCT: 23.5 % — ABNORMAL LOW (ref 39.0–52.0)
Hemoglobin: 7.2 g/dL — ABNORMAL LOW (ref 13.0–17.0)
Immature Granulocytes: 1 %
Lymphocytes Relative: 9 %
Lymphs Abs: 1 10*3/uL (ref 0.7–4.0)
MCH: 29.5 pg (ref 26.0–34.0)
MCHC: 30.6 g/dL (ref 30.0–36.0)
MCV: 96.3 fL (ref 80.0–100.0)
Monocytes Absolute: 1 10*3/uL (ref 0.1–1.0)
Monocytes Relative: 9 %
Neutro Abs: 8.5 10*3/uL — ABNORMAL HIGH (ref 1.7–7.7)
Neutrophils Relative %: 77 %
Platelets: 404 10*3/uL — ABNORMAL HIGH (ref 150–400)
RBC: 2.44 MIL/uL — ABNORMAL LOW (ref 4.22–5.81)
RDW: 17.8 % — ABNORMAL HIGH (ref 11.5–15.5)
WBC: 11.1 10*3/uL — ABNORMAL HIGH (ref 4.0–10.5)
nRBC: 0 % (ref 0.0–0.2)

## 2023-03-16 LAB — GLUCOSE, CAPILLARY
Glucose-Capillary: 129 mg/dL — ABNORMAL HIGH (ref 70–99)
Glucose-Capillary: 141 mg/dL — ABNORMAL HIGH (ref 70–99)
Glucose-Capillary: 106 mg/dL — ABNORMAL HIGH (ref 70–99)
Glucose-Capillary: 93 mg/dL (ref 70–99)
Glucose-Capillary: 110 mg/dL — ABNORMAL HIGH (ref 70–99)
Glucose-Capillary: 44 mg/dL — CL (ref 70–99)
Glucose-Capillary: 70 mg/dL (ref 70–99)

## 2023-03-16 LAB — POCT I-STAT, CHEM 8
BUN: 41 mg/dL — ABNORMAL HIGH (ref 8–23)
Calcium, Ion: 0.96 mmol/L — ABNORMAL LOW (ref 1.15–1.40)
Chloride: 87 mmol/L — ABNORMAL LOW (ref 98–111)
Creatinine, Ser: 8.5 mg/dL — ABNORMAL HIGH (ref 0.61–1.24)
Glucose, Bld: 104 mg/dL — ABNORMAL HIGH (ref 70–99)
HCT: 29 % — ABNORMAL LOW (ref 39.0–52.0)
Hemoglobin: 9.9 g/dL — ABNORMAL LOW (ref 13.0–17.0)
Potassium: 4.3 mmol/L (ref 3.5–5.1)
Sodium: 129 mmol/L — ABNORMAL LOW (ref 135–145)
TCO2: 32 mmol/L (ref 22–32)

## 2023-03-16 LAB — TYPE AND SCREEN
ABO/RH(D): B POS
Antibody Screen: NEGATIVE

## 2023-03-16 LAB — SURGICAL PCR SCREEN
MRSA, PCR: NEGATIVE
Staphylococcus aureus: NEGATIVE

## 2023-03-16 LAB — MAGNESIUM: Magnesium: 2.1 mg/dL (ref 1.7–2.4)

## 2023-03-16 LAB — HEPATITIS B SURFACE ANTIBODY, QUANTITATIVE: Hep B S AB Quant (Post): 149 m[IU]/mL

## 2023-03-16 SURGERY — AMPUTATION, FOOT, PARTIAL
Anesthesia: Monitor Anesthesia Care | Site: Foot | Laterality: Left

## 2023-03-16 MED ORDER — FENTANYL CITRATE (PF) 100 MCG/2ML IJ SOLN
INTRAMUSCULAR | Status: AC
  Administered 2023-03-16: 50 ug via INTRAVENOUS
  Filled 2023-03-16: qty 2

## 2023-03-16 MED ORDER — CHLORHEXIDINE GLUCONATE 0.12 % MT SOLN
15.0000 mL | Freq: Once | OROMUCOSAL | Status: AC
  Administered 2023-03-16: 15 mL via OROMUCOSAL
  Filled 2023-03-16: qty 15

## 2023-03-16 MED ORDER — ONDANSETRON HCL 4 MG/2ML IJ SOLN: 4.0000 mg | Freq: Once | INTRAMUSCULAR | Status: DC | PRN

## 2023-03-16 MED ORDER — OXYCODONE HCL 5 MG/5ML PO SOLN: 5.0000 mg | Freq: Once | ORAL | Status: DC | PRN

## 2023-03-16 MED ORDER — SODIUM CHLORIDE 0.9 % IV SOLN: INTRAVENOUS | Status: DC

## 2023-03-16 MED ORDER — HEPARIN SODIUM (PORCINE) 1000 UNIT/ML DIALYSIS: 2000.0000 [IU] | Freq: Once | INTRAMUSCULAR | Status: DC

## 2023-03-16 MED ORDER — CHLORHEXIDINE GLUCONATE 0.12 % MT SOLN
OROMUCOSAL | Status: AC
  Administered 2023-03-16: 15 mL via OROMUCOSAL
  Filled 2023-03-16: qty 15

## 2023-03-16 MED ORDER — PENTAFLUOROPROP-TETRAFLUOROETH EX AERO: 1.0000 | INHALATION_SPRAY | CUTANEOUS | Status: DC | PRN

## 2023-03-16 MED ORDER — FENTANYL CITRATE (PF) 100 MCG/2ML IJ SOLN: 50.0000 ug | Freq: Once | INTRAMUSCULAR | Status: AC

## 2023-03-16 MED ORDER — FENTANYL CITRATE (PF) 100 MCG/2ML IJ SOLN: 25.0000 ug | INTRAMUSCULAR | Status: DC | PRN

## 2023-03-16 MED ORDER — PROPOFOL 10 MG/ML IV BOLUS
INTRAVENOUS | Status: AC
  Filled 2023-03-16: qty 20

## 2023-03-16 MED ORDER — PHENYLEPHRINE HCL-NACL 20-0.9 MG/250ML-% IV SOLN
INTRAVENOUS | Status: DC | PRN
  Administered 2023-03-16: 40 ug/min via INTRAVENOUS

## 2023-03-16 MED ORDER — OXYCODONE HCL 5 MG PO TABS: 5.0000 mg | ORAL_TABLET | Freq: Once | ORAL | Status: DC | PRN

## 2023-03-16 MED ORDER — LIDOCAINE-PRILOCAINE 2.5-2.5 % EX CREA: 1.0000 | TOPICAL_CREAM | CUTANEOUS | Status: DC | PRN

## 2023-03-16 MED ORDER — BUPIVACAINE HCL (PF) 0.5 % IJ SOLN
INTRAMUSCULAR | Status: AC
  Filled 2023-03-16: qty 30

## 2023-03-16 MED ORDER — PHENYLEPHRINE 80 MCG/ML (10ML) SYRINGE FOR IV PUSH (FOR BLOOD PRESSURE SUPPORT)
PREFILLED_SYRINGE | INTRAVENOUS | Status: DC | PRN
  Administered 2023-03-16 (×3): 160 ug via INTRAVENOUS

## 2023-03-16 MED ORDER — ROPIVACAINE HCL 5 MG/ML IJ SOLN
INTRAMUSCULAR | Status: DC | PRN
Start: 2023-03-16 — End: 2023-03-16
  Administered 2023-03-16: 30 mL via EPIDURAL

## 2023-03-16 MED ORDER — LIDOCAINE HCL 2 % IJ SOLN
INTRAMUSCULAR | Status: AC
  Filled 2023-03-16: qty 20

## 2023-03-16 MED ORDER — 0.9 % SODIUM CHLORIDE (POUR BTL) OPTIME
TOPICAL | Status: DC | PRN
  Administered 2023-03-16: 1000 mL

## 2023-03-16 MED ORDER — CHLORHEXIDINE GLUCONATE 0.12 % MT SOLN: 15.0000 mL | Freq: Once | OROMUCOSAL | Status: AC

## 2023-03-16 MED ORDER — ORAL CARE MOUTH RINSE: 15.0000 mL | Freq: Once | OROMUCOSAL | Status: AC

## 2023-03-16 MED ORDER — HEPARIN SODIUM (PORCINE) 1000 UNIT/ML DIALYSIS: 1000.0000 [IU] | INTRAMUSCULAR | Status: DC | PRN

## 2023-03-16 MED ORDER — MIDAZOLAM HCL 2 MG/2ML IJ SOLN
INTRAMUSCULAR | Status: AC
  Filled 2023-03-16: qty 2

## 2023-03-16 MED ORDER — ALTEPLASE 2 MG IJ SOLR: 2.0000 mg | Freq: Once | INTRAMUSCULAR | Status: DC | PRN

## 2023-03-16 MED ORDER — ANTICOAGULANT SODIUM CITRATE 4% (200MG/5ML) IV SOLN: 5.0000 mL | Status: DC | PRN

## 2023-03-16 MED ORDER — LIDOCAINE HCL (PF) 1 % IJ SOLN: 5.0000 mL | INTRAMUSCULAR | Status: DC | PRN

## 2023-03-16 MED ORDER — NEPRO/CARBSTEADY PO LIQD: 237.0000 mL | ORAL | Status: DC | PRN

## 2023-03-16 MED ORDER — VASOPRESSIN 20 UNIT/ML IV SOLN
INTRAVENOUS | Status: AC
  Filled 2023-03-16: qty 1

## 2023-03-16 MED ORDER — DEXMEDETOMIDINE HCL IN NACL 400 MCG/100ML IV SOLN
INTRAVENOUS | Status: DC | PRN
  Administered 2023-03-16: .4 ug/kg/h via INTRAVENOUS

## 2023-03-16 SURGICAL SUPPLY — 53 items
APL PRP STRL LF DISP 70% ISPRP (MISCELLANEOUS)
APL SKNCLS STERI-STRIP NONHPOA (GAUZE/BANDAGES/DRESSINGS)
BAG COUNTER SPONGE SURGICOUNT (BAG) ×2 IMPLANT
BAG SPNG CNTER NS LX DISP (BAG) ×1
BENZOIN TINCTURE PRP APPL 2/3 (GAUZE/BANDAGES/DRESSINGS) ×2 IMPLANT
BLADE AVERAGE 25X9 (BLADE) IMPLANT
BLADE OSCILLATING /SAGITTAL (BLADE) ×2 IMPLANT
BLADE SURG 11 STRL SS (BLADE) IMPLANT
BLADE SURG 15 STRL LF DISP TIS (BLADE) IMPLANT
BLADE SURG 15 STRL SS (BLADE)
BNDG CMPR 5X4 KNIT ELC UNQ LF (GAUZE/BANDAGES/DRESSINGS) ×1
BNDG ELASTIC 4INX 5YD STR LF (GAUZE/BANDAGES/DRESSINGS) IMPLANT
BNDG ELASTIC 4X5.8 VLCR STR LF (GAUZE/BANDAGES/DRESSINGS) IMPLANT
BNDG GAUZE DERMACEA FLUFF 4 (GAUZE/BANDAGES/DRESSINGS) ×2 IMPLANT
BNDG GZE DERMACEA 4 6PLY (GAUZE/BANDAGES/DRESSINGS) ×1
CHLORAPREP W/TINT 26 (MISCELLANEOUS) IMPLANT
COVER SURGICAL LIGHT HANDLE (MISCELLANEOUS) ×2 IMPLANT
CUFF TOURN SGL QUICK 18X4 (TOURNIQUET CUFF) ×2 IMPLANT
CUFF TOURN SGL QUICK 24 (TOURNIQUET CUFF)
CUFF TRNQT CYL 24X4X16.5-23 (TOURNIQUET CUFF) IMPLANT
ELECT REM PT RETURN 9FT ADLT (ELECTROSURGICAL) ×1
ELECTRODE REM PT RTRN 9FT ADLT (ELECTROSURGICAL) IMPLANT
GAUZE PACKING IODOFORM 1/4X15 (PACKING) ×2 IMPLANT
GAUZE PAD ABD 8X10 STRL (GAUZE/BANDAGES/DRESSINGS) ×2 IMPLANT
GAUZE SPONGE 4X4 12PLY STRL (GAUZE/BANDAGES/DRESSINGS) ×2 IMPLANT
GAUZE XEROFORM 1X8 LF (GAUZE/BANDAGES/DRESSINGS) ×2 IMPLANT
GAUZE XEROFORM 5X9 LF (GAUZE/BANDAGES/DRESSINGS) IMPLANT
GLOVE BIO SURGEON STRL SZ8 (GLOVE) ×2 IMPLANT
GLOVE BIOGEL PI IND STRL 8 (GLOVE) ×2 IMPLANT
GOWN STRL REUS W/ TWL LRG LVL3 (GOWN DISPOSABLE) ×4 IMPLANT
GOWN STRL REUS W/TWL LRG LVL3 (GOWN DISPOSABLE) ×2
KIT BASIN OR (CUSTOM PROCEDURE TRAY) ×2 IMPLANT
KIT TURNOVER KIT B (KITS) ×2 IMPLANT
NDL PRECISIONGLIDE 27X1.5 (NEEDLE) ×2 IMPLANT
NEEDLE PRECISIONGLIDE 27X1.5 (NEEDLE) IMPLANT
NS IRRIG 1000ML POUR BTL (IV SOLUTION) ×2 IMPLANT
PACK ORTHO EXTREMITY (CUSTOM PROCEDURE TRAY) ×2 IMPLANT
PAD ARMBOARD 7.5X6 YLW CONV (MISCELLANEOUS) ×4 IMPLANT
PAD CAST 4YDX4 CTTN HI CHSV (CAST SUPPLIES) IMPLANT
PADDING CAST COTTON 4X4 STRL (CAST SUPPLIES)
SOL PREP POV-IOD 4OZ 10% (MISCELLANEOUS) ×2 IMPLANT
STAPLER VISISTAT 35W (STAPLE) IMPLANT
STRIP CLOSURE SKIN 1/2X4 (GAUZE/BANDAGES/DRESSINGS) ×2 IMPLANT
SUT ETHILON 2 0 PSLX (SUTURE) IMPLANT
SUT PROLENE 4 0 PS 2 18 (SUTURE) IMPLANT
SUT PROLENE 4 0 SH DA (SUTURE) IMPLANT
SUT VIC AB 3-0 PS2 18 (SUTURE)
SUT VIC AB 3-0 PS2 18XBRD (SUTURE) IMPLANT
SUT VICRYL 4-0 PS2 18IN ABS (SUTURE) ×2 IMPLANT
SYR CONTROL 10ML LL (SYRINGE) ×4 IMPLANT
TOWEL GREEN STERILE (TOWEL DISPOSABLE) ×2 IMPLANT
TUBE CONNECTING 12X1/4 (SUCTIONS) ×2 IMPLANT
YANKAUER SUCT BULB TIP NO VENT (SUCTIONS) IMPLANT

## 2023-03-16 NOTE — Transfer of Care (Signed)
Immediate Anesthesia Transfer of Care Note  Patient: [name]  Procedure(s) Performed: AMPUTATION MIDFOOT LEFT (Left: Foot) ACHILLES TENDON LENGTHENING (Left: Foot)  Patient Location: PACU  Anesthesia Type:MAC and Regional  Level of Consciousness: awake, alert , and oriented  Airway & Oxygen Therapy: Patient Spontanous Breathing  Post-op Assessment: Report given to RN and Post -op Vital signs reviewed and stable  Post vital signs: Reviewed and stable  Last Vitals:  Vitals Value Taken Time  BP 108/60 04/05/2023 1845  Temp    Pulse 80 03/15/2023 1846  Resp 17 03/24/2023 1846  SpO2 100 % 03/30/2023 1846  Vitals shown include unfiled device data.  Last Pain:  Vitals:   03/29/2023 1630  TempSrc:   PainSc: 0-No pain         Complications: There were no known notable events for this encounter.

## 2023-03-16 NOTE — Progress Notes (Signed)
[facility] Progress Note   Subjective: Patient seen in bed, scheduled for L foot surgery this afternoon and HD after surgery. Report feeling okay, Not in severe pain, No CP/dyspnea. Denies s&s of bleeding.   Objective Vitals:   03/15/23 1656 03/15/23 2040 03/22/2023 0456 03/28/2023 0739  BP: 98/62 109/64 (!) 114/53 124/61  Pulse: 69 66 84   Resp:  18 18 19   Temp:  97.8 F (36.6 C) 98.2 F (36.8 C) 98.2 F (36.8 C)  TempSrc:  Oral Oral Oral  SpO2:  96% (!) 82% 97%  Weight:      Height:       Physical Exam General:Alert oriented x4, no distress.  EENT: Sclera anicteric, throat clear. No JVD.  Heart:RRR no MRG Lungs:Bilateral lung clear, no rales or wheezing Abdomen:soft,  no mass, or ascites + bs Extremities:Ext no LE or UE edema, unable to visualize L foot- wrapped  Dialysis Access:   LUE AVG+bruit    Filed Weights   03-26-23 2143  Weight: 83.5 kg    Intake/Output Summary (Last 24 hours) at 03/24/2023 0948 Last data filed at 03/27/2023 0600 Gross per 24 hour  Intake 460 ml  Output --  Net 460 ml    Additional Objective Labs: Basic Metabolic Panel: Recent Labs  Lab Mar 26, 2023 2213 03/15/23 1549 03/19/2023 0557  NA 132*  --  130*  K 4.1  --  4.2  CL 85*  --  90*  CO2 31  --  27  GLUCOSE 214*  --  147*  BUN 26*  --  35*  CREATININE 4.48*  --  6.79*  CALCIUM 8.4*  --  7.8*  PHOS  --  4.1  --    Liver Function Tests: Recent Labs  Lab 03/15/23 1549 04/14/2023 0557  AST  --  17  ALT  --  16  ALKPHOS  --  98  BILITOT  --  0.7  PROT  --  5.8*  ALBUMIN 1.9* 1.8*   CBC: Recent Labs  Lab 03-26-23 2213 03/15/2023 0557  WBC 13.5* 11.1*  NEUTROABS 12.1* 8.5*  HGB 8.1* 7.2*  HCT 27.3* 23.5*  MCV 96.8 96.3  PLT 427* 404*   Blood Culture    Component Value Date/Time   SDES BLOOD RIGHT HAND 02/20/2023 0746   SPECREQUEST  02/20/2023 0746    BOTTLES DRAWN AEROBIC ONLY Blood Culture adequate volume   CULT  02/20/2023 0746    NO GROWTH 5  DAYS Performed at [facility], [address], [place], [place] [address]    REPTSTATUS 02/25/2023 FINAL 02/20/2023 0746   CBG: Recent Labs  Lab 03/15/23 1137 03/15/23 1637 03/15/23 2041 03/17/2023 0737 04/11/2023 0753  GLUCAP 127* 124* 229* 129* 141*    Studies/Results: DG Foot Complete Left  Result Date: 03/15/2023 CLINICAL DATA:  Pain with possible infection. Necrosis in the second through the fifth digits with open wounds. EXAM: LEFT FOOT - COMPLETE 3+ VIEW COMPARISON:  02/17/2023 FINDINGS: Severe soft tissue thinning and tuft erosions again demonstrated in the second, third, fourth, and fifth toes. Similar appearance to previous study. Old healed fracture deformity of the fifth metatarsal bone. No acute fracture or dislocation. No radiopaque soft tissue foreign bodies or soft tissue gas. Vascular calcifications. IMPRESSION: Soft tissue thinning and acral osteolysis demonstrated in the second, third, fourth, and fifth toes. Similar appearance to previous study. Electronically Signed   By: Burman Nieves M.D.   On: 03/15/2023 00:20    Medications:  anticoagulant sodium  citrate     ceFEPime (MAXIPIME) IV 1 g (03/15/23 2158)   vancomycin      atorvastatin  80 mg Oral q1800   brimonidine  1 drop Both Eyes Daily   Chlorhexidine Gluconate Cloth  6 each Topical Q0600   cinacalcet  180 mg Oral Q M,W,F   darbepoetin (ARANESP) injection - DIALYSIS  150 mcg Subcutaneous Q Tue-1800   doxercalciferol  4 mcg Intravenous Q M,W,F-HD   [START ON 03/17/2023] heparin  2,000 Units Dialysis Once in dialysis   insulin aspart  0-15 Units Subcutaneous TID WC   insulin aspart  0-5 Units Subcutaneous QHS   latanoprost  2 drop Both Eyes BID   metoprolol tartrate  12.5 mg Oral BID   sevelamer carbonate  2,400 mg Oral TID WC    Dialysis Orders: MWF at [facility] Lewayne Bunting) 4:15h    500/500     82kg    2K/2Ca    AVG   Heparin 2000 - sensipar 180 three times per week - hectorol 4  mcg IV - mircera 150 mcg q 2 wks, new order    Assessment/Plan: 1. Left foot gangrene - surgery scheduled for today  2. ESRD -on HD MWF. Next, HD today after surgery. 3. Anemia of CKD-  Hb 8.1. down to 7.2, denies s&s of bleeding. Aranesp on 10/1. 5. HTN -  BP's wnl here, cont home HTN medication.  6. Volume - no sig vol excess on exam. Pt report EDW normally 81kg. UF to dry as able. 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  Continue sensipar and VDRA. Binders when eating.  8.. Nutrition - NPO for surgery. Renal diet w/fluid restrictions once advanced. DM2 - per pmd  Virgina Norfolk, PA-C [facility] Apr 14, 2023,9:48 AM  LOS: 1 day

## 2023-03-16 NOTE — Brief Op Note (Signed)
03/29/2023  6:55 PM  PATIENT:  [name]  72 y.o. male  PRE-OPERATIVE DIAGNOSIS:  left foot gangrene  POST-OPERATIVE DIAGNOSIS:  left foot gangrene  PROCEDURE:  Procedure(s): AMPUTATION MIDFOOT LEFT (Left) ACHILLES TENDON LENGTHENING (Left)  SURGEON:  Surgeons and Role:    Felecia Shelling, DPM - Primary  PHYSICIAN ASSISTANT:   ASSISTANTS: none   ANESTHESIA:   regional and MAC  EBL:  20 mL   BLOOD ADMINISTERED:none  DRAINS: none   LOCAL MEDICATIONS USED:  NONE  SPECIMEN:  No Specimen  DISPOSITION OF SPECIMEN:  N/A  COUNTS:  YES  TOURNIQUET:  * Missing tourniquet times found for documented tourniquets in log: [id number] *  DICTATION: .Reubin Milan Dictation  PLAN OF CARE: Admit to inpatient   PATIENT DISPOSITION:  PACU - hemodynamically stable.   Delay start of Pharmacological VTE agent (>24hrs) due to surgical blood loss or risk of bleeding: not applicable  Felecia Shelling, DPM [facility]  Dr. Felecia Shelling, DPM    [address] [place], [place] [address]                Office [phone]  Fax [phone]

## 2023-03-16 NOTE — Anesthesia Postprocedure Evaluation (Signed)
Anesthesia Post Note  Patient: [name]  Procedure(s) Performed: AMPUTATION MIDFOOT LEFT (Left: Foot) ACHILLES TENDON LENGTHENING (Left: Foot)     Patient location during evaluation: PACU Anesthesia Type: Regional Level of consciousness: awake and alert Pain management: pain level controlled Vital Signs Assessment: post-procedure vital signs reviewed and stable Respiratory status: spontaneous breathing, nonlabored ventilation and respiratory function stable Cardiovascular status: stable and blood pressure returned to baseline Anesthetic complications: no   There were no known notable events for this encounter.  Last Vitals:  Vitals:   2023/03/26 1900 Mar 26, 2023 1915  BP: 111/72 116/63  Pulse: 84 91  Resp: 15 17  Temp:  36.8 C  SpO2: 98% 96%    Last Pain:  Vitals:   03/26/2023 1845  TempSrc:   PainSc: 0-No pain                 Beryle Lathe

## 2023-03-16 NOTE — Anesthesia Procedure Notes (Signed)
Anesthesia Regional Block: Popliteal block   Pre-Anesthetic Checklist: , timeout performed,  Correct Patient, Correct Site, Correct Laterality,  Correct Procedure, Correct Position, site marked,  Risks and benefits discussed,  Surgical consent,  Pre-op evaluation,  At surgeon's request and post-op pain management  Laterality: Left  Prep: Maximum Sterile Barrier Precautions used, chloraprep       Needles:  Injection technique: Single-shot  Needle Type: Echogenic Needle      Needle Gauge: 20     Additional Needles:   Procedures:,,,, ultrasound used (permanent image in chart),,    Narrative:  Injection made incrementally with aspirations every 5 mL.  Performed by: Personally  Anesthesiologist: Mariann Barter, MD

## 2023-03-16 NOTE — Anesthesia Preprocedure Evaluation (Addendum)
Anesthesia Evaluation  Patient identified by MRN, date of birth, ID band Patient awake    Reviewed: Allergy & Precautions, Patient's Chart, lab work & pertinent test results, reviewed documented beta blocker date and time   History of Anesthesia Complications Negative for: history of anesthetic complications  Airway Mallampati: III  TM Distance: >3 FB     Dental no notable dental hx.    Pulmonary shortness of breath   breath sounds clear to auscultation       Cardiovascular hypertension, Pt. on medications and Pt. on home beta blockers pulmonary hypertension (mod pHTN)+ CAD, + Past MI, + Cardiac Stents (2005 LAD), + CABG (2016) and +CHF (severe RV dysfunction, grade 2 diastolic dysfunction, normal LVEF)  + Valvular Problems/Murmurs (mild-mod AS) AS  Rhythm:Regular Rate:Normal + Systolic murmurs Echo 02/21/23  1. Left ventricular ejection fraction, by estimation, is 60 to 65%. The left ventricle has normal function. The left ventricle has no regional wall motion abnormalities. There is mild left ventricular hypertrophy. Left ventricular diastolic parameters are consistent with Grade II diastolic dysfunction (pseudonormalization). There is the interventricular septum is flattened in diastole ('D' shaped left ventricle), consistent with right ventricular volume overload.  2. Right ventricular systolic function is severely reduced. The right ventricular size is severely enlarged. There is moderately elevated pulmonary artery systolic pressure. The estimated right ventricular systolic pressure is 53.2 mmHg.  3. No evidence of mitral valve regurgitation.  4. Tricuspid valve regurgitation is moderate.  5. There is moderate calcification of the aortic valve. Aortic valve regurgitation is not visualized. Mild to moderate aortic valve stenosis.  6. The inferior vena cava is dilated in size with <50% respiratory variability, suggesting right  atrial pressure of 15 mmHg.     Neuro/Psych neg Seizures PSYCHIATRIC DISORDERS  Depression    negative neurological ROS     GI/Hepatic negative GI ROS, Neg liver ROS,,,  Endo/Other  diabetes, Poorly Controlled, Type 2, Insulin Dependent    Renal/GU ESRF and DialysisRenal disease  negative genitourinary   Musculoskeletal  (+) Arthritis , Osteoarthritis,  L foot gangrene   Abdominal   Peds  Hematology  (+) Blood dyscrasia, anemia   Anesthesia Other Findings   Reproductive/Obstetrics negative OB ROS                             Anesthesia Physical Anesthesia Plan  ASA: 4  Anesthesia Plan: Regional and MAC   Post-op Pain Management: Tylenol PO (pre-op)* and Regional block*   Induction: Intravenous  PONV Risk Score and Plan: 1 and Propofol infusion, TIVA and Treatment may vary due to age or medical condition  Airway Management Planned: Natural Airway and Simple Face Mask  Additional Equipment: ClearSight  Intra-op Plan:   Post-operative Plan:   Informed Consent: I have reviewed the patients History and Physical, chart, labs and discussed the procedure including the risks, benefits and alternatives for the proposed anesthesia with the patient or authorized representative who has indicated his/her understanding and acceptance.   Patient has DNR.  Discussed DNR with patient and Suspend DNR.   Dental advisory given  Plan Discussed with: CRNA  Anesthesia Plan Comments:        Anesthesia Quick Evaluation

## 2023-03-16 NOTE — Op Note (Signed)
OPERATIVE REPORT Patient name: [name] MRN: [id number] DOB: 30-Oct-1950  DOS: 14-Apr-2023  Preop Dx: Ischemic gangrene LT foot Postop Dx: same  Procedure:  1.  Transmetatarsal amputation left 3.  Tendo Achilles lengthening left  Surgeon: Felecia Shelling DPM  Anesthesia: Preoperative anesthesia popliteal block w/ IV sedation  Hemostasis: None  EBL: 20 mL Materials: None Injectables: None Pathology: left forefoot sent for discard  Condition: The patient tolerated the procedure and anesthesia well. No complications noted or reported   Justification for procedure: The patient is a 72 y.o. who presents today for surgical correction of gangrene left foot. The patient was told benefits as well as possible side effects of the surgery. The patient consented for surgical correction. The patient consent form was reviewed. All patient questions were answered. No guarantees were expressed or implied. The patient and the surgeon both signed the patient consent form with a witness present and placed in the patient's chart.   Procedure in Detail: The patient was brought to the operating room, remaining in the hospital bed at which time an aseptic scrub and drape were performed about the patient's respective lower extremity after anesthesia was induced as described above. Attention was then directed to the surgical area where procedure number one commenced.  Procedure #1: Tendo achilles lengthening left Three small 5 mm percutaneous transverse incisions were planned and made spaced approximately 1 cm apart to the posterior aspect of the Achilles tendon with the most distal incision beginning approximately 3 cm proximal to the insertion of the calcaneus.  The most distal and proximal incisions began bisecting the Achilles tendon and extended medially while the central incision was extended laterally, essentially creating an accordion type lengthening of the Achilles.  Increased ankle joint  dorsiflexion was noted after completion to alleviate pressure from the distal amputation stump and forefoot.  The incisions were primarily closed with 4-0 Prolene suture.  Procedure #2: Midfoot amputation left A fishmouth type incision was planned and made about the diaphysis of the metatarsals of the left foot.  Incision was carried directly down to the level of bone and the soft tissue was reflected away from the metatarsals and lisfranc joint in preparation for the ensuing amputation and disarticulation at the level of the tarsometatarsal joint.  The distal portion of the foot was distracted distally and all remaining soft tissue was really released with a scalpel and the forefoot was removed in toto.  Any nonviable tissue within the amputation site was excisionally removed including any exposed flexor and extensor tendons which were distracted distally and cut as far proximal as could be visualized.  Osteotomy of any protruding or hypertrophic bone within the midtarsals was performed using a sagittal blade bilateral sagittal saw.  Copious irrigation was then performed in preparation for primary closure.  Stainless steel skin staples were utilized to reapproximate the superficial skin edges of the amputation site.  Dry sterile compressive dressings were then applied to all previously mentioned incision sites about the patient's lower extremity. The patient was then transferred from the operating room to the recovery room having tolerated the procedure and anesthesia well. All vital signs are stable. After a brief stay in the recovery room the patient was readmitted to inpatient room with postoperative orders placed.    IMPRESSION: Little to no perfusion of the foot.  High likelihood of failure of the amputation site.  Patient will likely require more proximal major amputation  Felecia Shelling, DPM [facility]  Dr.  Felecia Shelling, DPM    [address] [place], [place] [address]                Office [phone]  Fax [phone]

## 2023-03-16 NOTE — Progress Notes (Signed)
Present with gangrene of L foot   03-21-2023 1448  TOC Brief Assessment  Insurance and Status Reviewed  Patient has primary care physician Yes  Home environment has been reviewed PTA  from home alone. Supportive son, Taylon.  Prior level of function: PTA  independent with ADL's. DME: RW, BSC, W/C.  Prior/Current Home Services No current home services  Social Determinants of Health Reivew SDOH reviewed no interventions necessary  Readmission risk has been reviewed No  Transition of care needs no transition of care needs at this time   Podiatry planning for left midfoot amputation today.  TOC team following and will assist with needs as they present. Gae Gallop RN,BSN,CM [phone]

## 2023-03-16 NOTE — Progress Notes (Signed)
PROGRESS NOTE  [name]  ONG:[id number] DOB: [birth year] DOA: 02/20/2023 PCP: [facility]   Brief Narrative: Patient is a 72 year old male with history of peripheral artery disease, coronary artery disease, ESRD on dialysis, diabetes type 2, hypertension who presented with worsening pain and drainage on his left foot.  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 Myra Gianotti on 9/10.  He was discharged home on antibiotics.  Presented back with persistent purulent drainage, foul smell, dark discoloration.  Vascular surgery, nephrology, podiatry consulted here.  Podiatry planning for left midfoot amputation today.  Assessment & Plan:  Principal Problem:   Gangrene of left foot (HCC) Active Problems:   HLD (hyperlipidemia)   Essential (primary) hypertension   ESRD (end stage renal disease) (HCC)   Coronary artery disease involving native coronary artery of native heart without angina pectoris   DM (diabetes mellitus) (HCC)  Left foot gangrene: Vascular surgery, podiatry consulted.  On aspirin, Plavix at home.  Has history of extensive peripheral vascular disease.He was recently hospitalized early September with diabetic foot gangrene on the same foot, vascular surgery was consulted and he had atherectomy, angioplasty of the femoral, popliteal and tibial arteries by Dr. Myra Gianotti on 9/10.  He has been revascularized maximally.Podiatry planning for left midfoot amputation today.  He is currently on board spectrum antibiotics  Coronary artery disease: No anginal symptoms.  Continue aspirin, beta-blocker  ESRD: Nephrology consulted for dialysis  Normocytic anemia: Hemoglobin of 7.2.  This is most likely secondary to ESRD.  Continue to monitor.  Transfuse if hemoglobin drops less than 7.  Hyponatremia: Likely  from volume overload.  Volume management as per dialysis  Hypertension: Continue metoprolol.  Monitor blood pressure  Hyperlipidemia: Continue statin  Type 2 diabetes: Currently on sliding scale.  Monitor blood sugars         DVT prophylaxis:SCDs Start: 03/15/23 0546     Code Status: Limited: Do not attempt resuscitation (DNR) -DNR-LIMITED -Do Not Intubate/DNI   Family Communication: None at bedside  Patient status:Inpatient  Patient is from :home  Anticipated discharge GM:[facility]U  Estimated DC date: After clearance from podiatry   Consultants: Podiatry, vascular surgery, nephrology  Procedures: None yet  Antimicrobials:  Anti-infectives (From admission, onward)    Start     Dose/Rate Route Frequency Ordered Stop   03/28/2023 1200  vancomycin (VANCOCIN) IVPB 1000 mg/200 mL premix        1,000 mg 200 mL/hr over 60 Minutes Intravenous Every M-W-F (Hemodialysis) 03/15/23 0551     03/28/2023 0000  ceFEPIme (MAXIPIME) 1 g in sodium chloride 0.9 % 100 mL IVPB        1 g 200 mL/hr over 30 Minutes Intravenous Every 24 hours 03/15/23 0551     03/15/23 0645  vancomycin (VANCOCIN) IVPB 1000 mg/200 mL premix        1,000 mg 200 mL/hr over 60 Minutes Intravenous  Once 03/15/23 0551 03/15/23 0718   03/15/23 0230  vancomycin (VANCOCIN) IVPB 1000 mg/200 mL premix        1,000 mg 200 mL/hr over 60 Minutes Intravenous  Once 03/15/23 0228 03/15/23 0503   03/15/23 0230  ceFEPIme (MAXIPIME) 2 g in sodium chloride 0.9 % 100 mL IVPB        2 g 200 mL/hr over 30 Minutes Intravenous  Once 03/15/23 0228 03/15/23 0343  Subjective: Patient seen and examined at bedside today. Hemodynamically stable.Marland Kitchen  He was comfortably lying on the bed.  Waiting for surgery today.  Objective: Vitals:   03/15/23 1656 03/15/23 2040 04/08/2023 0456 2023/04/08 0739  BP: 98/62 109/64 (!) 114/53 124/61  Pulse: 69 66 84   Resp:  18 18 19   Temp:  97.8 F (36.6 C) 98.2 F (36.8 C) 98.2 F (36.8 C)   TempSrc:  Oral Oral Oral  SpO2:  96% (!) 82% 97%  Weight:      Height:        Intake/Output Summary (Last 24 hours) at 2023-04-08 1126 Last data filed at 04-08-2023 0600 Gross per 24 hour  Intake 460 ml  Output --  Net 460 ml   Filed Weights   03/06/2023 2143  Weight: 83.5 kg    Examination:  General exam: Overall comfortable, not in distress HEENT: PERRL Respiratory system:  no wheezes or crackles  Cardiovascular system: S1 & S2 heard, RRR.  Pansystolic murmur Gastrointestinal system: Abdomen is nondistended, soft and nontender. Central nervous system: Alert and oriented Extremities: Left foot wrapped with dressing, chronic bilateral venous stasis changes, AV fistula on the left upper extremity Skin: No rashes, no ulcers,no icterus     Data Reviewed: I have personally reviewed following labs and imaging studies  CBC: Recent Labs  Lab 03/06/2023 2213 04/08/23 0557  WBC 13.5* 11.1*  NEUTROABS 12.1* 8.5*  HGB 8.1* 7.2*  HCT 27.3* 23.5*  MCV 96.8 96.3  PLT 427* 404*   Basic Metabolic Panel: Recent Labs  Lab 02/23/2023 2213 03/15/23 1549 Apr 08, 2023 0557  NA 132*  --  130*  K 4.1  --  4.2  CL 85*  --  90*  CO2 31  --  27  GLUCOSE 214*  --  147*  BUN 26*  --  35*  CREATININE 4.48*  --  6.79*  CALCIUM 8.4*  --  7.8*  MG  --   --  2.1  PHOS  --  4.1  --      No results found for this or any previous visit (from the past 240 hour(s)).   Radiology Studies: DG Foot Complete Left  Result Date: 03/15/2023 CLINICAL DATA:  Pain with possible infection. Necrosis in the second through the fifth digits with open wounds. EXAM: LEFT FOOT - COMPLETE 3+ VIEW COMPARISON:  02/17/2023 FINDINGS: Severe soft tissue thinning and tuft erosions again demonstrated in the second, third, fourth, and fifth toes. Similar appearance to previous study. Old healed fracture deformity of the fifth metatarsal bone. No acute fracture or dislocation. No radiopaque soft tissue foreign bodies or soft  tissue gas. Vascular calcifications. IMPRESSION: Soft tissue thinning and acral osteolysis demonstrated in the second, third, fourth, and fifth toes. Similar appearance to previous study. Electronically Signed   By: Burman Nieves M.D.   On: 03/15/2023 00:20    Scheduled Meds:  atorvastatin  80 mg Oral q1800   brimonidine  1 drop Both Eyes Daily   chlorhexidine  15 mL Mouth/Throat Once   Or   mouth rinse  15 mL Mouth Rinse Once   Chlorhexidine Gluconate Cloth  6 each Topical Q0600   cinacalcet  180 mg Oral Q M,W,F   darbepoetin (ARANESP) injection - DIALYSIS  150 mcg Subcutaneous Q Tue-1800   doxercalciferol  4 mcg Intravenous Q M,W,F-HD   [START ON 03/17/2023] heparin  2,000 Units Dialysis Once in dialysis   insulin aspart  0-15 Units Subcutaneous TID WC   insulin  aspart  0-5 Units Subcutaneous QHS   latanoprost  2 drop Both Eyes BID   metoprolol tartrate  12.5 mg Oral BID   sevelamer carbonate  2,400 mg Oral TID WC   Continuous Infusions:  sodium chloride     anticoagulant sodium citrate     ceFEPime (MAXIPIME) IV 1 g (03/15/23 2158)   vancomycin       LOS: 1 day   Burnadette Pop, MD [facility] P2024-10-16, 11:26 AM

## 2023-03-16 NOTE — Anesthesia Procedure Notes (Signed)
Anesthesia Regional Block: Adductor canal block   Pre-Anesthetic Checklist: , timeout performed,  Correct Patient, Correct Site, Correct Laterality,  Correct Procedure, Correct Position, site marked,  Risks and benefits discussed,  Surgical consent,  Pre-op evaluation,  At surgeon's request and post-op pain management  Laterality: Left  Prep: Maximum Sterile Barrier Precautions used, chloraprep       Needles:  Injection technique: Single-shot  Needle Type: Echogenic Needle      Needle Gauge: 20     Additional Needles:   Procedures:,,,, ultrasound used (permanent image in chart),,    Narrative:  Start time: 04-11-2023 4:40 PM End time: 2023-04-11 4:45 PM Injection made incrementally with aspirations every 5 mL.  Performed by: Personally  Anesthesiologist: Mariann Barter, MD

## 2023-03-16 NOTE — Interval H&P Note (Signed)
History and Physical Interval Note:  03-25-23 5:18 PM  [name]  has presented today for surgery, with the diagnosis of left foot gangrene.  The various methods of treatment have been discussed with the patient and family. After consideration of risks, benefits and other options for treatment, the patient has consented to  Procedure(s): AMPUTATION MIDFOOT LEFT (Left) ACHILLES TENDON LENGTHENING (Left) as a surgical intervention.  The patient's history has been reviewed, patient examined, no change in status, stable for surgery.  I have reviewed the patient's chart and labs.  Questions were answered to the patient's satisfaction.     Felecia Shelling

## 2023-03-17 ENCOUNTER — Encounter (HOSPITAL_COMMUNITY): Payer: Self-pay | Admitting: Podiatry

## 2023-03-17 DIAGNOSIS — I96 Gangrene, not elsewhere classified: Secondary | ICD-10-CM | POA: Diagnosis not present

## 2023-03-17 LAB — BASIC METABOLIC PANEL
Anion gap: 17 — ABNORMAL HIGH (ref 5–15)
BUN: 25 mg/dL — ABNORMAL HIGH (ref 8–23)
CO2: 26 mmol/L (ref 22–32)
Calcium: 7.6 mg/dL — ABNORMAL LOW (ref 8.9–10.3)
Chloride: 88 mmol/L — ABNORMAL LOW (ref 98–111)
Creatinine, Ser: 4.93 mg/dL — ABNORMAL HIGH (ref 0.61–1.24)
GFR, Estimated: 12 mL/min — ABNORMAL LOW (ref >60)
Glucose, Bld: 212 mg/dL — ABNORMAL HIGH (ref 70–99)
Potassium: 4.4 mmol/L (ref 3.5–5.1)
Sodium: 131 mmol/L — ABNORMAL LOW (ref 135–145)

## 2023-03-17 LAB — GLUCOSE, CAPILLARY
Glucose-Capillary: 174 mg/dL — ABNORMAL HIGH (ref 70–99)
Glucose-Capillary: 194 mg/dL — ABNORMAL HIGH (ref 70–99)
Glucose-Capillary: 186 mg/dL — ABNORMAL HIGH (ref 70–99)
Glucose-Capillary: 258 mg/dL — ABNORMAL HIGH (ref 70–99)
Glucose-Capillary: 273 mg/dL — ABNORMAL HIGH (ref 70–99)
Glucose-Capillary: 258 mg/dL — ABNORMAL HIGH (ref 70–99)

## 2023-03-17 LAB — CBC
HCT: 26 % — ABNORMAL LOW (ref 39.0–52.0)
Hemoglobin: 7.7 g/dL — ABNORMAL LOW (ref 13.0–17.0)
MCH: 28.2 pg (ref 26.0–34.0)
MCHC: 29.6 g/dL — ABNORMAL LOW (ref 30.0–36.0)
MCV: 95.2 fL (ref 80.0–100.0)
Platelets: 371 10*3/uL (ref 150–400)
RBC: 2.73 MIL/uL — ABNORMAL LOW (ref 4.22–5.81)
RDW: 18.1 % — ABNORMAL HIGH (ref 11.5–15.5)
WBC: 9.2 10*3/uL (ref 4.0–10.5)
nRBC: 0 % (ref 0.0–0.2)

## 2023-03-17 MED ORDER — INSULIN ASPART 100 UNIT/ML IJ SOLN: 0.0000 [IU] | Freq: Every day | INTRAMUSCULAR | Status: DC

## 2023-03-17 MED ORDER — VANCOMYCIN HCL IN DEXTROSE 1-5 GM/200ML-% IV SOLN
1000.0000 mg | Freq: Once | INTRAVENOUS | Status: AC
  Administered 2023-03-17: 1000 mg via INTRAVENOUS
  Filled 2023-03-17: qty 200

## 2023-03-17 MED ORDER — INSULIN ASPART 100 UNIT/ML IJ SOLN
0.0000 [IU] | Freq: Three times a day (TID) | INTRAMUSCULAR | Status: DC
  Administered 2023-03-17: 2 [IU] via SUBCUTANEOUS
  Administered 2023-03-17 – 2023-03-18 (×2): 5 [IU] via SUBCUTANEOUS
  Administered 2023-03-19 (×2): 1 [IU] via SUBCUTANEOUS

## 2023-03-17 MED ORDER — INSULIN GLARGINE-YFGN 100 UNIT/ML ~~LOC~~ SOLN
10.0000 [IU] | Freq: Every day | SUBCUTANEOUS | Status: DC
  Administered 2023-03-17 – 2023-03-18 (×2): 10 [IU] via SUBCUTANEOUS
  Filled 2023-03-17 (×2): qty 0.1

## 2023-03-17 MED ORDER — CHLORHEXIDINE GLUCONATE CLOTH 2 % EX PADS
6.0000 | MEDICATED_PAD | Freq: Every day | CUTANEOUS | Status: DC
  Administered 2023-03-18: 6 via TOPICAL

## 2023-03-17 MED ORDER — ZOLPIDEM TARTRATE 5 MG PO TABS
5.0000 mg | ORAL_TABLET | Freq: Once | ORAL | Status: AC
  Administered 2023-03-17: 5 mg via ORAL
  Filled 2023-03-17: qty 1

## 2023-03-17 NOTE — Plan of Care (Signed)

## 2023-03-17 NOTE — Progress Notes (Signed)
PROGRESS NOTE  Jkai Elfman  XBM:[id number] DOB: 11-19-1950 DOA: 02/17/2023 PCP: [facility]   Brief Narrative: Patient is a 72 year old male with history of peripheral artery disease, coronary artery disease, ESRD on dialysis, diabetes type 2, hypertension who presented with worsening pain and drainage on his left foot.  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 Myra Gianotti on 9/10.  He was discharged home on antibiotics.  Presented back with persistent purulent drainage, foul smell, dark discoloration.  Vascular surgery, nephrology, podiatry consulted here.  S/P left transmetatarsal amputation on 10/2  Assessment & Plan:  Principal Problem:   Gangrene of left foot (HCC) Active Problems:   HLD (hyperlipidemia)   Essential (primary) hypertension   ESRD (end stage renal disease) (HCC)   Coronary artery disease involving native coronary artery of native heart without angina pectoris   DM (diabetes mellitus) (HCC)  Left foot gangrene: Vascular surgery, podiatry consulted.  On aspirin, Plavix at home.  Has history of extensive peripheral vascular disease.He was recently hospitalized early September with diabetic foot gangrene on the same foot, vascular surgery was consulted and he had atherectomy, angioplasty of the femoral, popliteal and tibial arteries by Dr. Myra Gianotti on 9/10.  He has been revascularized maximally.He is currently on board spectrum antibiotics.  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  Podiatry suspecting  that he might need more proximal major amputation. Will do PT/OT evaluation.  Coronary artery disease: No anginal symptoms.  Continue aspirin, beta-blocker  ESRD: Nephrology consulted and following  for dialysis  Normocytic anemia: Hemoglobin of 7.2.  This is most likely secondary  to ESRD.  Continue to monitor.  Transfuse if hemoglobin drops less than 7.  Hyponatremia: Likely from volume overload.  Volume management as per dialysis  Hypertension: Continue metoprolol.  Monitor blood pressure  Hyperlipidemia: Continue statin  Type 2 diabetes: Currently on sliding scale.  Monitor blood sugars         DVT prophylaxis:SCDs Start: 03/15/23 0546     Code Status: Limited: Do not attempt resuscitation (DNR) -DNR-LIMITED -Do Not Intubate/DNI   Family Communication: None at bedside  Patient status:Inpatient  Patient is from :home  Anticipated discharge UU:VOZD  Estimated DC date: After clearance from podiatry   Consultants: Podiatry, vascular surgery, nephrology  Procedures: None yet  Antimicrobials:  Anti-infectives (From admission, onward)    Start     Dose/Rate Route Frequency Ordered Stop   03/17/23 0915  vancomycin (VANCOCIN) IVPB 1000 mg/200 mL premix        1,000 mg 200 mL/hr over 60 Minutes Intravenous  Once 03/17/23 0815 03/17/23 0937   04/08/2023 1200  vancomycin (VANCOCIN) IVPB 1000 mg/200 mL premix        1,000 mg 200 mL/hr over 60 Minutes Intravenous Every M-W-F (Hemodialysis) 03/15/23 0551     04/14/2023 0000  ceFEPIme (MAXIPIME) 1 g in sodium chloride 0.9 % 100 mL IVPB        1 g 200 mL/hr over 30 Minutes Intravenous Every 24 hours 03/15/23 0551     03/15/23 0645  vancomycin (VANCOCIN) IVPB 1000 mg/200 mL premix        1,000 mg 200 mL/hr over 60 Minutes Intravenous  Once 03/15/23 0551 03/15/23 0718   03/15/23 0230  vancomycin (VANCOCIN) IVPB 1000 mg/200 mL premix        1,000 mg 200 mL/hr over  60 Minutes Intravenous  Once 03/15/23 0228 03/15/23 0503   03/15/23 0230  ceFEPIme (MAXIPIME) 2 g in sodium chloride 0.9 % 100 mL IVPB        2 g 200 mL/hr over 30 Minutes Intravenous  Once 03/15/23 0228 03/15/23 0343       Subjective: Patient seen and examined at bedside today.  Hemodynamically stable.  Overall looks comfortable.  Lying in  bed.  Complains of pain on the operated foot  Objective: Vitals:   03/17/23 0016 03/17/23 0149 03/17/23 0501 03/17/23 0742  BP: 116/72 (!) 113/51 129/81 125/78  Pulse: 88 89 90 (!) 109  Resp: 17 18 18 18   Temp: 98.4 F (36.9 C) 98.4 F (36.9 C) 98.2 F (36.8 C) 98.3 F (36.8 C)  TempSrc: Oral Oral Oral   SpO2: 98% 96% 96% 99%  Weight:      Height:        Intake/Output Summary (Last 24 hours) at 03/17/2023 1102 Last data filed at 03/17/2023 0016 Gross per 24 hour  Intake 390 ml  Output 1020 ml  Net -630 ml   Filed Weights   02/17/2023 2143 April 11, 2023 2006  Weight: 83.5 kg 77.1 kg    Examination:   General exam: Overall comfortable, not in distress HEENT: PERRL Respiratory system:  no wheezes or crackles  Cardiovascular system: S1 & S2 heard, RRR.  Pansystolic murmur Gastrointestinal system: Abdomen is nondistended, soft and nontender. Central nervous system: Alert and oriented Extremities: Left wrapped with dressing, chronic bilateral venous stasis changes, AV fistula on the left upper extremity. Skin: No rashes, no ulcers,no icterus     Data Reviewed: I have personally reviewed following labs and imaging studies  CBC: Recent Labs  Lab 03/07/2023 2213 11-Apr-2023 0557 Apr 11, 2023 1623 03/17/23 0526  WBC 13.5* 11.1*  --  9.2  NEUTROABS 12.1* 8.5*  --   --   HGB 8.1* 7.2* 9.9* 7.7*  HCT 27.3* 23.5* 29.0* 26.0*  MCV 96.8 96.3  --  95.2  PLT 427* 404*  --  371   Basic Metabolic Panel: Recent Labs  Lab 02/14/2023 2213 03/15/23 1549 2023/04/11 0557 04/11/23 1623 03/17/23 0526  NA 132*  --  130* 129* 131*  K 4.1  --  4.2 4.3 4.4  CL 85*  --  90* 87* 88*  CO2 31  --  27  --  26  GLUCOSE 214*  --  147* 104* 212*  BUN 26*  --  35* 41* 25*  CREATININE 4.48*  --  6.79* 8.50* 4.93*  CALCIUM 8.4*  --  7.8*  --  7.6*  MG  --   --  2.1  --   --   PHOS  --  4.1  --   --   --      Recent Results (from the past 240 hour(s))  Surgical pcr screen     Status: None    Collection Time: 04/11/23 12:44 PM   Specimen: Nasal Mucosa; Nasal Swab  Result Value Ref Range Status   MRSA, PCR NEGATIVE NEGATIVE Final   Staphylococcus aureus NEGATIVE NEGATIVE Final    Comment: (NOTE) The Xpert SA Assay (FDA approved for NASAL specimens in patients 74 years of age and older), is one component of a comprehensive surveillance program. It is not intended to diagnose infection nor to guide or monitor treatment. Performed at [facility], [address], [place], [place] [address]      Radiology Studies: DG Foot Complete Left  Result  Date: 04/06/2023 CLINICAL DATA:  Postoperative gangrene of left foot. EXAM: LEFT FOOT - COMPLETE 3+ VIEW COMPARISON:  2023/03/22 FINDINGS: Interval postoperative changes with left foot amputation in the distal metatarsal level. Marginal bony resection appears intact. Soft tissue gas and skin clips likely representing postoperative change. Vascular calcifications. Degenerative changes in the intertarsal joints. IMPRESSION: Interval left foot amputation at the distal metatarsal level with new postoperative changes demonstrated. Electronically Signed   By: Burman Nieves M.D.   On: 03/24/2023 23:27   DG MINI C-ARM IMAGE ONLY  Result Date: 03/28/2023 There is no interpretation for this exam.  This order is for images obtained during a surgical procedure.  Please See "Surgeries" Tab for more information regarding the procedure.    Scheduled Meds:  atorvastatin  80 mg Oral q1800   brimonidine  1 drop Both Eyes Daily   Chlorhexidine Gluconate Cloth  6 each Topical Q0600   cinacalcet  180 mg Oral Q M,W,F   darbepoetin (ARANESP) injection - DIALYSIS  150 mcg Subcutaneous Q Tue-1800   doxercalciferol  4 mcg Intravenous Q M,W,F-HD   insulin aspart  0-15 Units Subcutaneous TID WC   insulin aspart  0-5 Units Subcutaneous QHS   latanoprost  2 drop Both Eyes BID   metoprolol tartrate  12.5 mg Oral BID   sevelamer carbonate  2,400 mg Oral TID  WC   Continuous Infusions:  ceFEPime (MAXIPIME) IV 1 g (03/17/23 0144)   vancomycin       LOS: 2 days   Burnadette Pop, MD [facility] P10/08/2022, 11:02 AM

## 2023-03-17 NOTE — Progress Notes (Signed)
[facility] Progress Note   Subjective:   Patient seen and examined at bedside.  Upset because he was informed if his foot does not heal well he may require further amputation.  Tolerated dialysis well this AM.  Denies CP, palpitations, abdominal pain and n/v/d.   Objective Vitals:   03/17/23 0016 03/17/23 0149 03/17/23 0501 03/17/23 0742  BP: 116/72 (!) 113/51 129/81 125/78  Pulse: 88 89 90 (!) 109  Resp: 17 18 18 18   Temp: 98.4 F (36.9 C) 98.4 F (36.9 C) 98.2 F (36.8 C) 98.3 F (36.8 C)  TempSrc: Oral Oral Oral   SpO2: 98% 96% 96% 99%  Weight:      Height:       Physical Exam General:chronically ill appearing male in NAD Heart:IRIR, no mrg Lungs:CTAB, nml WOB on RA Abdomen:soft, NTND Extremities:trace edema on L, foot dressed, no edema on R Dialysis Access: LU AVG +b/t   Filed Weights   03/08/2023 2143 03/31/2023 2006  Weight: 83.5 kg 77.1 kg    Intake/Output Summary (Last 24 hours) at 03/17/2023 1255 Last data filed at 03/17/2023 1100 Gross per 24 hour  Intake 630 ml  Output 1020 ml  Net -390 ml    Additional Objective Labs: Basic Metabolic Panel: Recent Labs  Lab 03/07/2023 2213 03/15/23 1549 04/07/2023 0557 04/11/2023 1623 03/17/23 0526  NA 132*  --  130* 129* 131*  K 4.1  --  4.2 4.3 4.4  CL 85*  --  90* 87* 88*  CO2 31  --  27  --  26  GLUCOSE 214*  --  147* 104* 212*  BUN 26*  --  35* 41* 25*  CREATININE 4.48*  --  6.79* 8.50* 4.93*  CALCIUM 8.4*  --  7.8*  --  7.6*  PHOS  --  4.1  --   --   --    Liver Function Tests: Recent Labs  Lab 03/15/23 1549 03/27/2023 0557  AST  --  17  ALT  --  16  ALKPHOS  --  98  BILITOT  --  0.7  PROT  --  5.8*  ALBUMIN 1.9* 1.8*   CBC: Recent Labs  Lab 02/26/2023 2213 03/26/2023 0557 03/26/2023 1623 03/17/23 0526  WBC 13.5* 11.1*  --  9.2  NEUTROABS 12.1* 8.5*  --   --   HGB 8.1* 7.2* 9.9* 7.7*  HCT 27.3* 23.5* 29.0* 26.0*  MCV 96.8 96.3  --  95.2  PLT 427* 404*  --  371     Studies/Results: DG Foot Complete Left  Result Date: 03/25/2023 CLINICAL DATA:  Postoperative gangrene of left foot. EXAM: LEFT FOOT - COMPLETE 3+ VIEW COMPARISON:  03/04/2023 FINDINGS: Interval postoperative changes with left foot amputation in the distal metatarsal level. Marginal bony resection appears intact. Soft tissue gas and skin clips likely representing postoperative change. Vascular calcifications. Degenerative changes in the intertarsal joints. IMPRESSION: Interval left foot amputation at the distal metatarsal level with new postoperative changes demonstrated. Electronically Signed   By: Burman Nieves M.D.   On: 04/03/2023 23:27   DG MINI C-ARM IMAGE ONLY  Result Date: 04/07/2023 There is no interpretation for this exam.  This order is for images obtained during a surgical procedure.  Please See "Surgeries" Tab for more information regarding the procedure.    Medications:  ceFEPime (MAXIPIME) IV 1 g (03/17/23 0144)   vancomycin      atorvastatin  80 mg Oral q1800   brimonidine  1 drop Both Eyes  Daily   Chlorhexidine Gluconate Cloth  6 each Topical Q0600   cinacalcet  180 mg Oral Q M,W,F   darbepoetin (ARANESP) injection - DIALYSIS  150 mcg Subcutaneous Q Tue-1800   doxercalciferol  4 mcg Intravenous Q M,W,F-HD   insulin aspart  0-9 Units Subcutaneous TID WC   latanoprost  2 drop Both Eyes BID   metoprolol tartrate  12.5 mg Oral BID   sevelamer carbonate  2,400 mg Oral TID WC    Dialysis Orders: MWF at [facility]) 4:15h    500/500     82kg    2K/2Ca    AVG   Heparin 2000 - sensipar 180 three times per week - hectorol 4 mcg IV - mircera 150 mcg q 2 wks, new order     Assessment/Plan: 1. Left foot gangrene - TMA yesterday by Dr. Logan Bores.  Per notes little to no perfusion to the foot and will likely require further amputation.   2. ESRD -on HD MWF. HD overnight.  Next HD tomorrow per regular schedule.  3. Anemia of CKD-  Hgb 7.7 today.  Aranesp on 10/1. 5. HTN -  BP's wnl here, cont home HTN medication.  6. Volume - no sig vol excess on exam. Pt report EDW normally 81kg. UF to dry as able. 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  Continue sensipar and VDRA. Binders when eating.  8.. Nutrition - Renal diet w/fluid restrictions once advanced. DM2 - per pmd  Virgina Norfolk, PA-C [facility] 03/17/2023,12:55 PM  LOS: 2 days

## 2023-03-17 NOTE — Progress Notes (Signed)
   03/17/23 0016  Vitals  Temp 98.4 F (36.9 C)  Temp Source Oral  BP 116/72  MAP (mmHg) 112  BP Location Right Arm  BP Method Automatic  Patient Position (if appropriate) Lying  Pulse Rate 88  Pulse Rate Source Monitor  Resp 17  Oxygen Therapy  SpO2 98 %  O2 Device Room Air  Patient Activity (if Appropriate) In bed  Pulse Oximetry Type Continuous  During Treatment Monitoring  Blood Flow Rate (mL/min) 400 mL/min  Arterial Pressure (mmHg) -140 mmHg  Venous Pressure (mmHg) 230 mmHg  TMP (mmHg) 6 mmHg  Ultrafiltration Rate (mL/min) 353 mL/min  Dialysate Flow Rate (mL/min) 300 ml/min  Dialysate Potassium Concentration 3  Dialysate Calcium Concentration 2.5  Intra-Hemodialysis Comments Tx completed  Post Treatment  Dialyzer Clearance Lightly streaked  Liters Processed 84  Fluid Removed (mL) 1000 mL  Tolerated HD Treatment Yes  AVG/AVF Arterial Site Held (minutes) 7 minutes  AVG/AVF Venous Site Held (minutes) 7 minutes  Fistula / Graft Left Upper arm Arteriovenous vein graft  Placement Date/Time: 01/30/14 1232   Placed prior to admission: No  Orientation: Left  Access Location: Upper arm  Access Type: Arteriovenous vein graft  Expiration Date: 03/27/18  Site Condition No complications  Fistula / Graft Assessment Thrill;Bruit  Status Deaccessed  Needle Size 15  Drainage Description None   Hand off to [employer]

## 2023-03-17 NOTE — Progress Notes (Addendum)
Subjective: 1 Day Post-Op Procedure(s) (LRB): AMPUTATION MIDFOOT LEFT (Left) ACHILLES TENDON LENGTHENING (Left) DOS: 03/23/2023  Objective: Vital signs in last 24 hours: Temp:  [98 F (36.7 C)-98.4 F (36.9 C)] 98 F (36.7 C) (10/03 1447) Pulse Rate:  [78-111] 89 (10/03 1447) Resp:  [12-20] 18 (10/03 0742) BP: (108-135)/(51-81) 123/58 (10/03 1447) SpO2:  [90 %-100 %] 99 % (10/03 0742) Weight:  [77.1 kg] 77.1 kg (10/02 2006)  Recent Labs    2023-03-29 2213 03/27/2023 0557 04/04/2023 1623 03/17/23 0526  HGB 8.1* 7.2* 9.9* 7.7*   Recent Labs    03/30/2023 0557 03/25/2023 1623 03/17/23 0526  WBC 11.1*  --  9.2  RBC 2.44*  --  2.73*  HCT 23.5* 29.0* 26.0*  PLT 404*  --  371   Recent Labs    04/08/2023 0557 04/07/2023 1623 03/17/23 0526  NA 130* 129* 131*  K 4.2 4.3 4.4  CL 90* 87* 88*  CO2 27  --  26  BUN 35* 41* 25*  CREATININE 6.79* 8.50* 4.93*  GLUCOSE 147* 104* 212*  CALCIUM 7.8*  --  7.6*   No results for input(s): "LABPT", "INR" in the last 72 hours.       POD#1 LT foot. 03/17/2023  Physical Exam: Staples intact. Skin cool to touch. Areas of ischemia and eschar remain. See above photos.   Assessment/Plan: 1 Day Post-Op Procedure(s) (LRB): AMPUTATION MIDFOOT LEFT (Left) ACHILLES TENDON LENGTHENING (Left) DOS: 04/05/2023  -Patient seen at bedside -Despite the high likelihood of failure of the amputation site due to poor perfusion patient would still like to 'give it a chance' to heal -Dressings changed.  Leave clean dry and intact until f/u in office 1 week -Strict NWB LLE.  Nonweightbearing status order placed.  Recommend PT eval prior to discharge -Recommend DC on oral antibiotics x 1 week -Podiatry will sign off   Felecia Shelling 03/17/2023, 3:44 PM  Felecia Shelling, DPM [facility]  Dr. Felecia Shelling, DPM    [address] [place], [place] [address]                Office [phone]  Fax [phone]

## 2023-03-17 NOTE — Consult Note (Signed)
Value-Based [facility] Institute  [facility] Inpatient Consult   03/17/2023  Saintclair Schroader 03-24-1951 [id number]  [facility] [[facility]]  Accountable Care Organization [ACO] Patient:  [facility]  Primary Care Provider: [facility]  is not a [facility] provider   [facility] Liaison screened the patient remotely at [facility].    The patient was screened for 30 day readmission hospitalization with noted high risk score for unplanned readmission risk 2 hospital admissions in 6 months.  The patient was assessed for potential [facility]) Care Management service needs for post hospital transition for care coordination. Review of patient's electronic medical record reveals patient is from home alone per inpatient [facility] RN and receives HD MWF [facility] per Renal Navigator.   Plan:   Referral request for community care coordination: pending, no needs currently noted   Community Care Management/Population Health does not replace or interfere with any arrangements made by the Inpatient Transition of Care team.   For questions contact:   Charlesetta Shanks, RN, BSN, CCM [place]  [facility], [facility] Liaison Direct Dial: [phone] or secure chat Website: [email or web address]

## 2023-03-17 NOTE — Care Management Important Message (Signed)
Important Message  Patient Details  Name: [name] MRN: [id number] Date of Birth: 01-Jul-1950   Important Message Given:  Yes - Medicare IM     Sherilyn Banker 03/17/2023, 2:36 PM

## 2023-03-17 NOTE — Inpatient Diabetes Management (Signed)
Inpatient Diabetes Program Recommendations  AACE/ADA: New Consensus Statement on Inpatient Glycemic Control (2015)  Target Ranges:  Prepandial:   less than 140 mg/dL      Peak postprandial:   less than 180 mg/dL (1-2 hours)      Critically ill patients:  140 - 180 mg/dL   Lab Results  Component Value Date   GLUCAP 194 (H) 03/17/2023   HGBA1C 8.4 (H) 02/17/2023    Review of Glycemic Control  Latest Reference Range & Units 04/04/2023 18:50 03/22/2023 19:07 03/18/2023 19:10 03/17/23 01:45 03/17/23 07:40  Glucose-Capillary 70 - 99 mg/dL 70 44 (LL) 578 (H) 469 (H) 194 (H)   Diabetes history: DM 2 Outpatient Diabetes medications:  Novolog 0-9 units tid with meals Lantus 10 units daily Current orders for Inpatient glycemic control:  Novolog 0-15 units tid with meals  Inpatient Diabetes Program Recommendations:    Note hypoglycemia.  Consider reducing Novolog correction to sensitive tid with meals.   Thanks,  Beryl Meager, RN, BC-ADM Inpatient Diabetes Coordinator Pager [phone]  (8a-5p)

## 2023-03-18 DIAGNOSIS — I96 Gangrene, not elsewhere classified: Secondary | ICD-10-CM | POA: Diagnosis not present

## 2023-03-18 LAB — BASIC METABOLIC PANEL
Anion gap: 15 (ref 5–15)
BUN: 48 mg/dL — ABNORMAL HIGH (ref 8–23)
CO2: 25 mmol/L (ref 22–32)
Calcium: 7.8 mg/dL — ABNORMAL LOW (ref 8.9–10.3)
Chloride: 86 mmol/L — ABNORMAL LOW (ref 98–111)
Creatinine, Ser: 6.64 mg/dL — ABNORMAL HIGH (ref 0.61–1.24)
GFR, Estimated: 8 mL/min — ABNORMAL LOW (ref >60)
Glucose, Bld: 294 mg/dL — ABNORMAL HIGH (ref 70–99)
Potassium: 4.9 mmol/L (ref 3.5–5.1)
Sodium: 126 mmol/L — ABNORMAL LOW (ref 135–145)

## 2023-03-18 LAB — GLUCOSE, CAPILLARY
Glucose-Capillary: 262 mg/dL — ABNORMAL HIGH (ref 70–99)
Glucose-Capillary: 63 mg/dL — ABNORMAL LOW (ref 70–99)
Glucose-Capillary: 34 mg/dL — CL (ref 70–99)
Glucose-Capillary: 37 mg/dL — CL (ref 70–99)
Glucose-Capillary: 78 mg/dL (ref 70–99)
Glucose-Capillary: 122 mg/dL — ABNORMAL HIGH (ref 70–99)

## 2023-03-18 LAB — CBC
HCT: 26.6 % — ABNORMAL LOW (ref 39.0–52.0)
Hemoglobin: 7.8 g/dL — ABNORMAL LOW (ref 13.0–17.0)
MCH: 28 pg (ref 26.0–34.0)
MCHC: 29.3 g/dL — ABNORMAL LOW (ref 30.0–36.0)
MCV: 95.3 fL (ref 80.0–100.0)
Platelets: 416 10*3/uL — ABNORMAL HIGH (ref 150–400)
RBC: 2.79 MIL/uL — ABNORMAL LOW (ref 4.22–5.81)
RDW: 18.2 % — ABNORMAL HIGH (ref 11.5–15.5)
WBC: 15.2 10*3/uL — ABNORMAL HIGH (ref 4.0–10.5)
nRBC: 0 % (ref 0.0–0.2)

## 2023-03-18 MED ORDER — ALTEPLASE 2 MG IJ SOLR: 2.0000 mg | Freq: Once | INTRAMUSCULAR | Status: DC | PRN

## 2023-03-18 MED ORDER — HEPARIN SODIUM (PORCINE) 1000 UNIT/ML DIALYSIS: 2000.0000 [IU] | INTRAMUSCULAR | Status: DC | PRN

## 2023-03-18 MED ORDER — AMOXICILLIN-POT CLAVULANATE 500-125 MG PO TABS
1.0000 | ORAL_TABLET | Freq: Two times a day (BID) | ORAL | Status: DC
  Administered 2023-03-18 – 2023-03-19 (×4): 1 via ORAL
  Filled 2023-03-18 (×5): qty 1

## 2023-03-18 MED ORDER — GLUCOSE 40 % PO GEL: 2.0000 | ORAL | Status: AC

## 2023-03-18 MED ORDER — PENTAFLUOROPROP-TETRAFLUOROETH EX AERO: 1.0000 | INHALATION_SPRAY | CUTANEOUS | Status: DC | PRN

## 2023-03-18 MED ORDER — LIDOCAINE HCL (PF) 1 % IJ SOLN: 5.0000 mL | INTRAMUSCULAR | Status: DC | PRN

## 2023-03-18 MED ORDER — HEPARIN SODIUM (PORCINE) 1000 UNIT/ML DIALYSIS: 1000.0000 [IU] | INTRAMUSCULAR | Status: DC | PRN

## 2023-03-18 MED ORDER — LIDOCAINE-PRILOCAINE 2.5-2.5 % EX CREA: 1.0000 | TOPICAL_CREAM | CUTANEOUS | Status: DC | PRN

## 2023-03-18 MED ORDER — HEPARIN SODIUM (PORCINE) 1000 UNIT/ML IJ SOLN
2000.0000 [IU] | Freq: Once | INTRAMUSCULAR | Status: AC
  Administered 2023-03-18: 2000 [IU] via INTRAVENOUS
  Filled 2023-03-18 (×2): qty 2

## 2023-03-18 MED ORDER — DEXTROSE 50 % IV SOLN: 1.0000 | Freq: Once | INTRAVENOUS | Status: AC

## 2023-03-18 MED ORDER — ANTICOAGULANT SODIUM CITRATE 4% (200MG/5ML) IV SOLN: 5.0000 mL | Status: DC | PRN

## 2023-03-18 MED ORDER — ZOLPIDEM TARTRATE 5 MG PO TABS
5.0000 mg | ORAL_TABLET | Freq: Once | ORAL | Status: AC
  Administered 2023-03-18: 5 mg via ORAL
  Filled 2023-03-18: qty 1

## 2023-03-18 MED ORDER — DEXTROSE 50 % IV SOLN
INTRAVENOUS | Status: AC
  Administered 2023-03-18: 50 mL via INTRAVENOUS
  Filled 2023-03-18: qty 50

## 2023-03-18 NOTE — Plan of Care (Signed)

## 2023-03-18 NOTE — Progress Notes (Signed)
PROGRESS NOTE  [name]  YQM:[id number] DOB: 05/27/1951 DOA: 02/25/2023 PCP: [facility]   Brief Narrative: Patient is a 72 year old male with history of peripheral artery disease, coronary artery disease, ESRD on dialysis, diabetes type 2, hypertension who presented with worsening pain and drainage on his left foot.  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 Myra Gianotti on 9/10.  He was discharged home on antibiotics.  Presented back with persistent purulent drainage, foul smell, dark discoloration.  Vascular surgery, nephrology, podiatry consulted here.  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  Awaiting PT/OT evaluation.   Assessment & Plan:  Principal Problem:   Gangrene of left foot (HCC) Active Problems:   HLD (hyperlipidemia)   Essential (primary) hypertension   ESRD (end stage renal disease) (HCC)   Coronary artery disease involving native coronary artery of native heart without angina pectoris   DM (diabetes mellitus) (HCC)  Left foot gangrene: Vascular surgery, podiatry consulted.  On aspirin, Plavix at home.  Has history of extensive peripheral vascular disease.He was recently hospitalized early September with diabetic foot gangrene on the same foot, vascular surgery was consulted and he had atherectomy, angioplasty of the femoral, popliteal and tibial arteries by Dr. Myra Gianotti on 9/10.  He has been revascularized maximally.He is currently on board spectrum antibiotics.  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  Podiatry suspecting  that he might need more proximal major amputation but will address as an outpatient.  He has double leukocytosis today.  Check CBC tomorrow.  Will change antibiotics to Augmentin as per podiatry. PT/OT evaluation still pending.  Coronary artery disease:  No anginal symptoms.  Continue aspirin, beta-blocker  ESRD: Nephrology consulted and following  for dialysis  Normocytic anemia: Hemoglobin of 7.2.  This is most likely secondary to ESRD.  Continue to monitor.  Transfuse if hemoglobin drops less than 7.  Hyponatremia: Likely from volume overload.  Volume management as per dialysis  Hypertension: Continue metoprolol.  Monitor blood pressure  Hyperlipidemia: Continue statin  Type 2 diabetes: Currently on sliding scale.  Monitor blood sugars         DVT prophylaxis:SCDs Start: 03/15/23 0546     Code Status: Limited: Do not attempt resuscitation (DNR) -DNR-LIMITED -Do Not Intubate/DNI   Family Communication: None at bedside  Patient status:Inpatient  Patient is from :home  Anticipated discharge BM:WUXL  Estimated DC date: after PT/OT eval   Consultants: Podiatry, vascular surgery, nephrology  Procedures: None yet  Antimicrobials:  Anti-infectives (From admission, onward)    Start     Dose/Rate Route Frequency Ordered Stop   03/17/23 0915  vancomycin (VANCOCIN) IVPB 1000 mg/200 mL premix        1,000 mg 200 mL/hr over 60 Minutes Intravenous  Once 03/17/23 0815 03/17/23 0937   03/26/2023 1200  vancomycin (VANCOCIN) IVPB 1000 mg/200 mL premix        1,000 mg 200 mL/hr over 60 Minutes Intravenous Every M-W-F (Hemodialysis) 03/15/23 0551     04/02/2023 0000  ceFEPIme (MAXIPIME) 1 g in sodium chloride 0.9 % 100 mL IVPB        1 g 200 mL/hr over 30 Minutes Intravenous Every 24 hours 03/15/23 0551     03/15/23 0645  vancomycin (VANCOCIN) IVPB 1000 mg/200 mL premix        1,000 mg 200 mL/hr over 60 Minutes Intravenous  Once 03/15/23 0551 03/15/23 0718   03/15/23 0230  vancomycin (VANCOCIN) IVPB 1000 mg/200 mL premix        1,000 mg 200 mL/hr over 60 Minutes Intravenous  Once 03/15/23 0228 03/15/23 0503   03/15/23 0230  ceFEPIme (MAXIPIME) 2 g in sodium chloride 0.9 % 100 mL IVPB        2 g 200 mL/hr over 30 Minutes  Intravenous  Once 03/15/23 0228 03/15/23 0343       Subjective: Patient seen and examined at bedside at dialysis suite.  He looks comfortable.  Pain is well-controlled on the left foot today.  Denies any new complaints  Objective: Vitals:   03/18/23 1000 03/18/23 1030 03/18/23 1100 03/18/23 1130  BP: 130/70 107/63 112/62 (!) 107/56  Pulse: 85 86 (!) 104 86  Resp: 14 11 13 14   Temp:      TempSrc:      SpO2: 99% 97% 96% 98%  Weight:      Height:        Intake/Output Summary (Last 24 hours) at 03/18/2023 1147 Last data filed at 03/17/2023 1500 Gross per 24 hour  Intake 100 ml  Output --  Net 100 ml   Filed Weights   2023/04/11 2143 04/04/2023 2006 03/18/23 0855  Weight: 83.5 kg 77.1 kg 77.1 kg    Examination:  General exam: Overall comfortable, not in distress HEENT: PERRL Respiratory system:  no wheezes or crackles  Cardiovascular system: S1 & S2 heard, RRR.  Pansystolic murmur Gastrointestinal system: Abdomen is nondistended, soft and nontender. Central nervous system: Alert and oriented Extremities: Left leg wrapped with dressing, chronic bilateral venous stasis, AV fistula on the left upper extremity Skin: No rashes, no ulcers,no icterus     Data Reviewed: I have personally reviewed following labs and imaging studies  CBC: Recent Labs  Lab 04-11-23 2213 04/14/2023 0557 03/22/2023 1623 03/17/23 0526 03/18/23 0346  WBC 13.5* 11.1*  --  9.2 15.2*  NEUTROABS 12.1* 8.5*  --   --   --   HGB 8.1* 7.2* 9.9* 7.7* 7.8*  HCT 27.3* 23.5* 29.0* 26.0* 26.6*  MCV 96.8 96.3  --  95.2 95.3  PLT 427* 404*  --  371 416*   Basic Metabolic Panel: Recent Labs  Lab 04-11-2023 2213 03/15/23 1549 03/31/2023 0557 04/13/2023 1623 03/17/23 0526 03/18/23 0346  NA 132*  --  130* 129* 131* 126*  K 4.1  --  4.2 4.3 4.4 4.9  CL 85*  --  90* 87* 88* 86*  CO2 31  --  27  --  26 25  GLUCOSE 214*  --  147* 104* 212* 294*  BUN 26*  --  35* 41* 25* 48*  CREATININE 4.48*  --  6.79* 8.50* 4.93*  6.64*  CALCIUM 8.4*  --  7.8*  --  7.6* 7.8*  MG  --   --  2.1  --   --   --   PHOS  --  4.1  --   --   --   --      Recent Results (from the past 240 hour(s))  Surgical pcr screen     Status: None   Collection Time: 03/23/2023 12:44 PM   Specimen: Nasal Mucosa; Nasal Swab  Result Value Ref Range Status   MRSA, PCR NEGATIVE NEGATIVE Final   Staphylococcus aureus NEGATIVE NEGATIVE Final    Comment: (NOTE) The Xpert SA Assay (FDA approved for NASAL specimens in patients 68 years of age and older), is  one component of a comprehensive surveillance program. It is not intended to diagnose infection nor to guide or monitor treatment. Performed at [facility], [address], [place], [place] [address]      Radiology Studies: DG Foot Complete Left  Result Date: March 17, 2023 CLINICAL DATA:  Postoperative gangrene of left foot. EXAM: LEFT FOOT - COMPLETE 3+ VIEW COMPARISON:  03/10/2023 FINDINGS: Interval postoperative changes with left foot amputation in the distal metatarsal level. Marginal bony resection appears intact. Soft tissue gas and skin clips likely representing postoperative change. Vascular calcifications. Degenerative changes in the intertarsal joints. IMPRESSION: Interval left foot amputation at the distal metatarsal level with new postoperative changes demonstrated. Electronically Signed   By: Burman Nieves M.D.   On: 03/17/23 23:27   DG MINI C-ARM IMAGE ONLY  Result Date: March 17, 2023 There is no interpretation for this exam.  This order is for images obtained during a surgical procedure.  Please See "Surgeries" Tab for more information regarding the procedure.    Scheduled Meds:  atorvastatin  80 mg Oral q1800   brimonidine  1 drop Both Eyes Daily   Chlorhexidine Gluconate Cloth  6 each Topical Q0600   Chlorhexidine Gluconate Cloth  6 each Topical Q0600   cinacalcet  180 mg Oral Q M,W,F   darbepoetin (ARANESP) injection - DIALYSIS  150 mcg Subcutaneous Q Tue-1800    doxercalciferol  4 mcg Intravenous Q M,W,F-HD   insulin aspart  0-9 Units Subcutaneous TID WC   insulin glargine-yfgn  10 Units Subcutaneous Daily   latanoprost  2 drop Both Eyes BID   metoprolol tartrate  12.5 mg Oral BID   sevelamer carbonate  2,400 mg Oral TID WC   Continuous Infusions:  anticoagulant sodium citrate     ceFEPime (MAXIPIME) IV 1 g (03/17/23 2127)   vancomycin       LOS: 3 days   Burnadette Pop, MD [facility] P10/09/2022, 11:47 AM

## 2023-03-18 NOTE — Progress Notes (Signed)
OT Cancellation Note  Patient Details Name: Saad Buhl MRN: [id number] DOB: 03-03-1951   Cancelled Treatment:    Reason Eval/Treat Not Completed: Patient at procedure or test/ unavailable- pt in HD. Will follow and see as able.   Barry Brunner, OT Acute Rehabilitation Services Office [phone]   Chancy Milroy 03/18/2023, 8:58 AM

## 2023-03-18 NOTE — Progress Notes (Signed)
   03/18/23 1350  Vitals  Temp 97.8 F (36.6 C)  Pulse Rate 88  Resp 18  BP (!) 146/64  SpO2 (!) 60 %  O2 Device Room Air  Weight 75.1 kg  Type of Weight Post-Dialysis  Oxygen Therapy  Patient Activity (if Appropriate) In bed  Pulse Oximetry Type Continuous  Oximetry Probe Site Changed No  Post Treatment  Dialyzer Clearance Lightly streaked  Hemodialysis Intake (mL) 0 mL  Liters Processed 90.3  Fluid Removed (mL) 2000 mL  Tolerated HD Treatment Yes  AVG/AVF Arterial Site Held (minutes) 10 minutes  AVG/AVF Venous Site Held (minutes) 10 minutes   Received patient in bed to unit.  Alert and oriented. To person---baseline Informed consent signed and in chart.   TX duration:4.0  Patient tolerated well.  Transported back to the room  Alert, without acute distress.  Hand-off given to patient's nurse.   Access used: LUAG Access issues: no complications  Total UF removed: 2000 Medication(s) given: hectoral iv x1--vancomycin dose was discontinued   [facility]

## 2023-03-18 NOTE — Progress Notes (Signed)
PT Cancellation Note  Patient Details Name: [name] MRN: [id number] DOB: 1951-01-15   Cancelled Treatment:    Reason Eval/Treat Not Completed: (P) Patient at procedure or test/unavailable Pt continues to be HD. PT will follow back for Evaluation tomorrow.  Daryn Hicks B. Beverely Risen PT, DPT Acute Rehabilitation Services Please use secure chat or  Call Office [phone]    Elon Alas Fleet 03/18/2023, 1:34 PM

## 2023-03-18 NOTE — Care Plan (Signed)
RN Clinical research associate received patient from dialysis. Patient's BG taken which resulted at 67. Orange juice given, in which patient could not tolerate and vomited. Patient given Zofran and some more juice. BG rechecked and resulted at 37. Adikari, MD paged, who then placed an order for D50 to be given IVP. Medication effective and patient's BG increased to 78. Will continue plan of care for patient.

## 2023-03-18 NOTE — Progress Notes (Signed)
PT Cancellation Note  Patient Details Name: [name] MRN: [id number] DOB: 12/08/1950   Cancelled Treatment:    Reason Eval/Treat Not Completed: (P) Patient at procedure or test/unavailable. Pt is off the for for HD. Pt will follow back for Evaluation this afternoon as able.   Maelle Sheaffer B. Beverely Risen PT, DPT Acute Rehabilitation Services Please use secure chat or  Call Office [phone]   Elon Alas Fleet 03/18/2023, 9:01 AM

## 2023-03-18 NOTE — Progress Notes (Signed)
[facility] Progress Note   Subjective:   Seen on HD. Noted Na 126 today. Patient denies dizziness, weakness, nausea, CP, SOB and edema.   Objective Vitals:   03/17/23 1447 03/17/23 2038 03/18/23 0506 03/18/23 0721  BP: (!) 123/58 129/77 (!) 140/81 (!) 97/58  Pulse: 89 87 (!) 104 79  Resp:  18 18 17   Temp: 98 F (36.7 C) 97.7 F (36.5 C) 98 F (36.7 C) 98.3 F (36.8 C)  TempSrc:  Oral    SpO2:  90%  92%  Weight:      Height:       Physical Exam General: Alert male in NAR Heart: RRR, no murmurs, rubs or gallops Lungs: CTA bilaterally Abdomen: Soft, non-distended, +BS Extremities: No edema b/l lower extremities Dialysis Access: LUE AVG accessed  Additional Objective Labs: Basic Metabolic Panel: Recent Labs  Lab 03/15/23 1549 04/12/2023 0557 04/06/2023 1623 03/17/23 0526 03/18/23 0346  NA  --  130* 129* 131* 126*  K  --  4.2 4.3 4.4 4.9  CL  --  90* 87* 88* 86*  CO2  --  27  --  26 25  GLUCOSE  --  147* 104* 212* 294*  BUN  --  35* 41* 25* 48*  CREATININE  --  6.79* 8.50* 4.93* 6.64*  CALCIUM  --  7.8*  --  7.6* 7.8*  PHOS 4.1  --   --   --   --    Liver Function Tests: Recent Labs  Lab 03/15/23 1549 04/11/2023 0557  AST  --  17  ALT  --  16  ALKPHOS  --  98  BILITOT  --  0.7  PROT  --  5.8*  ALBUMIN 1.9* 1.8*   No results for input(s): "LIPASE", "AMYLASE" in the last 168 hours. CBC: Recent Labs  Lab 03/09/2023 2213 03/19/2023 0557 04/13/2023 1623 03/17/23 0526 03/18/23 0346  WBC 13.5* 11.1*  --  9.2 15.2*  NEUTROABS 12.1* 8.5*  --   --   --   HGB 8.1* 7.2* 9.9* 7.7* 7.8*  HCT 27.3* 23.5* 29.0* 26.0* 26.6*  MCV 96.8 96.3  --  95.2 95.3  PLT 427* 404*  --  371 416*   Blood Culture    Component Value Date/Time   SDES BLOOD RIGHT HAND 02/20/2023 0746   SPECREQUEST  02/20/2023 0746    BOTTLES DRAWN AEROBIC ONLY Blood Culture adequate volume   CULT  02/20/2023 0746    NO GROWTH 5 DAYS Performed at [facility], [address] [place], [place] [address]    REPTSTATUS 02/25/2023 FINAL 02/20/2023 0746    Cardiac Enzymes: No results for input(s): "CKTOTAL", "CKMB", "CKMBINDEX", "TROPONINI" in the last 168 hours. CBG: Recent Labs  Lab 03/17/23 1217 03/17/23 1640 03/17/23 1727 03/17/23 2133 03/18/23 0719  GLUCAP 186* 258* 273* 258* 262*   Iron Studies: No results for input(s): "IRON", "TIBC", "TRANSFERRIN", "FERRITIN" in the last 72 hours. @lablastinr3 @ Studies/Results: DG Foot Complete Left  Result Date: 03/23/2023 CLINICAL DATA:  Postoperative gangrene of left foot. EXAM: LEFT FOOT - COMPLETE 3+ VIEW COMPARISON:  02/14/2023 FINDINGS: Interval postoperative changes with left foot amputation in the distal metatarsal level. Marginal bony resection appears intact. Soft tissue gas and skin clips likely representing postoperative change. Vascular calcifications. Degenerative changes in the intertarsal joints. IMPRESSION: Interval left foot amputation at the distal metatarsal level with new postoperative changes demonstrated. Electronically Signed   By: Burman Nieves M.D.   On: 03/30/2023 23:27  DG MINI C-ARM IMAGE ONLY  Result Date: 15-Apr-2023 There is no interpretation for this exam.  This order is for images obtained during a surgical procedure.  Please See "Surgeries" Tab for more information regarding the procedure.   Medications:  anticoagulant sodium citrate     ceFEPime (MAXIPIME) IV 1 g (03/17/23 2127)   vancomycin      atorvastatin  80 mg Oral q1800   brimonidine  1 drop Both Eyes Daily   Chlorhexidine Gluconate Cloth  6 each Topical Q0600   Chlorhexidine Gluconate Cloth  6 each Topical Q0600   cinacalcet  180 mg Oral Q M,W,F   darbepoetin (ARANESP) injection - DIALYSIS  150 mcg Subcutaneous Q Tue-1800   doxercalciferol  4 mcg Intravenous Q M,W,F-HD   heparin sodium (porcine)  2,000 Units Intravenous Once   insulin aspart  0-9 Units Subcutaneous TID WC   insulin glargine-yfgn  10 Units  Subcutaneous Daily   latanoprost  2 drop Both Eyes BID   metoprolol tartrate  12.5 mg Oral BID   sevelamer carbonate  2,400 mg Oral TID WC    Dialysis Orders: MWF at [facility]) 4:15h    500/500     82kg    2K/2Ca    AVG   Heparin 2000 - sensipar 180 three times per week - hectorol 4 mcg IV - mircera 150 mcg q 2 wks, new order  Assessment/Plan: 1. Left foot gangrene - TMA yesterday by Dr. Logan Bores.  Per notes little to no perfusion to the foot and will likely require further amputation.   2. ESRD -on HD MWF. Next HD today per regular schedule.  3. Anemia of CKD-  Hgb 7.8 today. Aranesp on 10/1. 5. HTN -  BP's wnl here, cont home HTN medication.  6. Volume - no sig vol excess on exam but Na is low today. UF with HD today as tolerated. 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  Continue sensipar and VDRA. Binders when eating.  8.. Nutrition - Renal diet w/fluid restrictions once advanced. DM2 - per pmd  Rogers Blocker, PA-C 03/18/2023, 8:48 AM  [facility] Pager: [phone]

## 2023-03-19 ENCOUNTER — Inpatient Hospital Stay (HOSPITAL_COMMUNITY): Payer: Medicare Other

## 2023-03-19 DIAGNOSIS — G459 Transient cerebral ischemic attack, unspecified: Secondary | ICD-10-CM | POA: Diagnosis not present

## 2023-03-19 DIAGNOSIS — I96 Gangrene, not elsewhere classified: Secondary | ICD-10-CM | POA: Diagnosis not present

## 2023-03-19 LAB — BASIC METABOLIC PANEL
Anion gap: 16 — ABNORMAL HIGH (ref 5–15)
BUN: 29 mg/dL — ABNORMAL HIGH (ref 8–23)
CO2: 23 mmol/L (ref 22–32)
Calcium: 7.7 mg/dL — ABNORMAL LOW (ref 8.9–10.3)
Chloride: 89 mmol/L — ABNORMAL LOW (ref 98–111)
Creatinine, Ser: 5.45 mg/dL — ABNORMAL HIGH (ref 0.61–1.24)
GFR, Estimated: 11 mL/min — ABNORMAL LOW (ref >60)
Glucose, Bld: 230 mg/dL — ABNORMAL HIGH (ref 70–99)
Potassium: 5 mmol/L (ref 3.5–5.1)
Sodium: 128 mmol/L — ABNORMAL LOW (ref 135–145)

## 2023-03-19 LAB — CBC
HCT: 29.4 % — ABNORMAL LOW (ref 39.0–52.0)
Hemoglobin: 8.4 g/dL — ABNORMAL LOW (ref 13.0–17.0)
MCH: 28.2 pg (ref 26.0–34.0)
MCHC: 28.6 g/dL — ABNORMAL LOW (ref 30.0–36.0)
MCV: 98.7 fL (ref 80.0–100.0)
Platelets: 453 10*3/uL — ABNORMAL HIGH (ref 150–400)
RBC: 2.98 MIL/uL — ABNORMAL LOW (ref 4.22–5.81)
RDW: 18.9 % — ABNORMAL HIGH (ref 11.5–15.5)
WBC: 17.9 10*3/uL — ABNORMAL HIGH (ref 4.0–10.5)
nRBC: 0.2 % (ref 0.0–0.2)

## 2023-03-19 LAB — GLUCOSE, CAPILLARY
Glucose-Capillary: 124 mg/dL — ABNORMAL HIGH (ref 70–99)
Glucose-Capillary: 89 mg/dL (ref 70–99)
Glucose-Capillary: 144 mg/dL — ABNORMAL HIGH (ref 70–99)
Glucose-Capillary: 117 mg/dL — ABNORMAL HIGH (ref 70–99)

## 2023-03-19 MED ORDER — SODIUM CHLORIDE 0.9 % IV SOLN: INTRAVENOUS | Status: DC

## 2023-03-19 MED ORDER — ASPIRIN 81 MG PO TBEC
81.0000 mg | DELAYED_RELEASE_TABLET | Freq: Every day | ORAL | Status: DC
  Administered 2023-03-19: 81 mg via ORAL
  Filled 2023-03-19: qty 1

## 2023-03-19 MED ORDER — CLOPIDOGREL BISULFATE 75 MG PO TABS
75.0000 mg | ORAL_TABLET | Freq: Every day | ORAL | Status: DC
  Administered 2023-03-19: 75 mg via ORAL
  Filled 2023-03-19: qty 1

## 2023-03-19 MED ORDER — ONDANSETRON HCL 4 MG/2ML IJ SOLN
INTRAMUSCULAR | Status: AC
  Filled 2023-03-19: qty 2

## 2023-03-19 NOTE — Evaluation (Signed)
Occupational Therapy Evaluation Patient Details Name: [name] MRN: [id number] DOB: March 18, 1951 Today's Date: 03/19/2023   History of Present Illness Pt is a 72 y/o male presenting on 03/23/2023 with foot pain.  Noted recent admission in sept s/p neurectomy and angioplasty of L superficial fem-pop artery. 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  Possible need for further amputation per podiatry. PMHx:  ESRD HD, DM, CAD, CABG, HTN, HLD.   Clinical Impression   Patient admitted for the diagnosis above.  PTA he was Independent with mobility, ADL and iADL.  Currently he is having difficulty maintaining NWB to his L leg, and needing up to Min A for basic transfers and up to Mod A for lower body ADL at a bedlevel.  OT will continue efforts in the acute setting to address deficits, and Patient will benefit from continued inpatient follow up therapy, <3 hours/day        If plan is discharge home, recommend the following: Assist for transportation;Assistance with cooking/housework;A lot of help with walking and/or transfers    Functional Status Assessment  Patient has had a recent decline in their functional status and demonstrates the ability to make significant improvements in function in a reasonable and predictable amount of time.  Equipment Recommendations  None recommended by OT    Recommendations for Other Services       Precautions / Restrictions Precautions Precautions: Fall Precaution Comments: monitor O2 sats Required Braces or Orthoses: Other Brace Other Brace: L post-op shoe Restrictions Weight Bearing Restrictions: Yes LLE Weight Bearing: Non weight bearing      Mobility Bed Mobility Overal bed mobility: Modified Independent                  Transfers Overall transfer level: Needs assistance Equipment used: Rolling walker (2 wheels) Transfers: Sit to/from Stand Sit to Stand: Contact guard assist                  Balance  Overall balance assessment: Needs assistance Sitting-balance support: No upper extremity supported, Feet supported Sitting balance-Leahy Scale: Good     Standing balance support: Bilateral upper extremity supported, Reliant on assistive device for balance Standing balance-Leahy Scale: Poor                             ADL either performed or assessed with clinical judgement   ADL Overall ADL's : Needs assistance/impaired Eating/Feeding: Set up;Bed level   Grooming: Wash/dry hands;Wash/dry face;Supervision/safety;Sitting   Upper Body Bathing: Supervision/ safety;Sitting   Lower Body Bathing: Moderate assistance;Bed level   Upper Body Dressing : Contact guard assist;Sitting   Lower Body Dressing: Moderate assistance;Bed level   Toilet Transfer: Minimal assistance;Squat-pivot;BSC/3in1                   Vision Patient Visual Report: No change from baseline       Perception Perception: Not tested       Praxis Praxis: Not tested       Pertinent Vitals/Pain Pain Assessment Pain Assessment: Faces Faces Pain Scale: Hurts little more Pain Location: L foot Pain Descriptors / Indicators: Tender, Operative site guarding Pain Intervention(s): Monitored during session     Extremity/Trunk Assessment Upper Extremity Assessment Upper Extremity Assessment: Overall WFL for tasks assessed   Lower Extremity Assessment Lower Extremity Assessment: Defer to PT evaluation   Cervical / Trunk Assessment Cervical / Trunk Assessment: Normal   Communication Communication Communication: No apparent  difficulties Cueing Techniques: Verbal cues   Cognition Arousal: Alert Behavior During Therapy: WFL for tasks assessed/performed Overall Cognitive Status: Impaired/Different from baseline                       Memory: Decreased recall of precautions               General Comments  VSS on RA    Exercises     Shoulder Instructions      Home Living  Family/patient expects to be discharged to:: Private residence Living Arrangements: Alone Available Help at Discharge: Family;Available PRN/intermittently Type of Home: House Home Access: Stairs to enter [employer] of Steps: 4 Entrance Stairs-Rails: Can reach both Home Layout: Able to live on main level with bedroom/bathroom;Laundry or work area in basement     [employer] Shower/Tub: Chief Strategy Officer: Standard Bathroom Accessibility: Yes   Home Equipment: Gilmer Mor - single point;Wheelchair - manual;Shower seat;BSC/3in1          Prior Functioning/Environment Prior Level of Function : Independent/Modified Independent             Mobility Comments: ambulatory without DME ADLs Comments: No assist with ADL or iADL        OT Problem List: Decreased strength;Decreased activity tolerance;Impaired balance (sitting and/or standing);Decreased safety awareness;Decreased knowledge of use of DME or AE;Decreased knowledge of precautions;Pain      OT Treatment/Interventions: Self-care/ADL training;Therapeutic activities;Balance training;DME and/or AE instruction;Patient/family education    OT Goals(Current goals can be found in the care plan section) Acute Rehab OT Goals Patient Stated Goal: Return home OT Goal Formulation: With patient Time For Goal Achievement: 04/01/23 Potential to Achieve Goals: Good ADL Goals Pt Will Perform Upper Body Bathing: with set-up;sitting Pt Will Perform Lower Body Bathing: with set-up;sitting/lateral leans Pt Will Perform Upper Body Dressing: with set-up;sitting Pt Will Perform Lower Body Dressing: with set-up;sitting/lateral leans Pt Will Transfer to Toilet: with min assist;squat pivot transfer;bedside commode  OT Frequency: Min 1X/week    Co-evaluation              AM-PAC OT "6 Clicks" Daily Activity     Outcome Measure Help from another person eating meals?: None Help from another person taking care of personal  grooming?: None Help from another person toileting, which includes using toliet, bedpan, or urinal?: A Lot Help from another person bathing (including washing, rinsing, drying)?: A Lot Help from another person to put on and taking off regular upper body clothing?: A Little Help from another person to put on and taking off regular lower body clothing?: A Lot 6 Click Score: 17   End of Session Equipment Utilized During Treatment: Rolling walker (2 wheels);Gait belt Nurse Communication: Mobility status  Activity Tolerance: Patient tolerated treatment well Patient left: in bed;with call bell/phone within reach  OT Visit Diagnosis: Unsteadiness on feet (R26.81)                Time: 1610-9604 OT Time Calculation (min): 17 min Charges:  OT General Charges $OT Visit: 1 Visit OT Evaluation $OT Eval Moderate Complexity: 1 Mod  03/19/2023  RP, OTR/L  Acute Rehabilitation Services  Office:  [phone]   Suzanna Obey 03/19/2023, 11:44 AM

## 2023-03-19 NOTE — Evaluation (Signed)
Physical Therapy Evaluation Patient Details Name: [name] MRN: [id number] DOB: [birth year] Today's Date: 03/19/2023  History of Present Illness  Pt is a 72 y/o male presenting on 9/30 with foot pain.  Noted recent admission in sept s/p neurectomy and angioplasty of L superficial fem-pop artery. 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  Possible need for further amputation per podiatry. PMHx:  ESRD HD, DM, CAD, CABG, HTN, HLD.  Clinical Impression  Pt presents to PT with deficits in functional mobility, strength, power, gait, balance, endurance. Pt has difficulty maintaining NWB through L foot during transfers and is unable to tolerate hopping on RLE at this time due to RLE and BUE fatigue. Pt reports he is not sure he would be able to mobilize within his home with a wheelchair, and has 4 steps to enter his home, thus he needs to be able to tolerate hopping for greater periods to return home safely. PT recommends short term inpatient PT services at the time of discharge.        If plan is discharge home, recommend the following: A lot of help with walking and/or transfers;A lot of help with bathing/dressing/bathroom;Assistance with cooking/housework;Assist for transportation;Help with stairs or ramp for entrance   Can travel by private vehicle   No    Equipment Recommendations Rolling walker (2 wheels)  Recommendations for Other Services       Functional Status Assessment Patient has had a recent decline in their functional status and demonstrates the ability to make significant improvements in function in a reasonable and predictable amount of time.     Precautions / Restrictions Precautions Precautions: Fall Restrictions Weight Bearing Restrictions: Yes LLE Weight Bearing: Non weight bearing      Mobility  Bed Mobility Overal bed mobility: Modified Independent                  Transfers Overall transfer level: Needs assistance Equipment  used: Rolling walker (2 wheels) Transfers: Sit to/from Stand Sit to Stand: Contact guard assist           General transfer comment: pt with difficulty maintaining NWB through L foot despite frequent verbal and tactile cues from PT    Ambulation/Gait Ambulation/Gait assistance: Min assist Gait Distance (Feet): 4 Feet Assistive device: Rolling walker (2 wheels) Gait Pattern/deviations: Shuffle Gait velocity: reduced Gait velocity interpretation: <1.31 ft/sec, indicative of household ambulator   General Gait Details: pt with short shuffling steps with RLE, attempting to hop on RLE but unable to clear foot and often placing L foot down on ground to rest.  Stairs            Wheelchair Mobility     Tilt Bed    Modified Rankin (Stroke Patients Only)       Balance Overall balance assessment: Needs assistance Sitting-balance support: No upper extremity supported, Feet supported Sitting balance-Leahy Scale: Good     Standing balance support: Bilateral upper extremity supported, Reliant on assistive device for balance Standing balance-Leahy Scale: Poor                               Pertinent Vitals/Pain Pain Assessment Pain Assessment: 0-10 Pain Score: 5  Pain Location: L foot Pain Descriptors / Indicators: Aching Pain Intervention(s): Monitored during session    Home Living Family/patient expects to be discharged to:: Private residence Living Arrangements: Alone Available Help at Discharge: Family;Available PRN/intermittently Type of Home: House Home Access:  Stairs to enter Entrance Stairs-Rails: Can reach both Entrance Stairs-Number of Steps: 4   Home Layout: Able to live on main level with bedroom/bathroom;Laundry or work area in [employer] Equipment: Gilmer Mor - single point;Wheelchair - manual;Shower seat;BSC/3in1      Prior Function Prior Level of Function : Independent/Modified Independent             Mobility Comments: ambulatory  without DME       Extremity/Trunk Assessment   Upper Extremity Assessment Upper Extremity Assessment: Overall WFL for tasks assessed    Lower Extremity Assessment Lower Extremity Assessment: Generalized weakness    Cervical / Trunk Assessment Cervical / Trunk Assessment: Normal  Communication   Communication Communication: No apparent difficulties Cueing Techniques: Verbal cues  Cognition Arousal: Alert Behavior During Therapy: WFL for tasks assessed/performed Overall Cognitive Status: Impaired/Different from baseline Area of Impairment: Memory, Awareness                     Memory: Decreased recall of precautions     Awareness: Emergent            General Comments General comments (skin integrity, edema, etc.): VSS on RA    Exercises     Assessment/Plan    PT Assessment Patient needs continued PT services  PT Problem List Decreased strength;Decreased activity tolerance;Decreased balance;Decreased mobility;Decreased knowledge of use of DME;Decreased safety awareness;Decreased knowledge of precautions;Pain       PT Treatment Interventions DME instruction;Gait training;Stair training;Functional mobility training;Therapeutic activities;Therapeutic exercise;Balance training;Neuromuscular re-education;Patient/family education;Wheelchair mobility training    PT Goals (Current goals can be found in the Care Plan section)  Acute Rehab PT Goals Patient Stated Goal: to return to independence PT Goal Formulation: With patient Time For Goal Achievement: 04/02/23 Potential to Achieve Goals: Fair    Frequency Min 1X/week     Co-evaluation               AM-PAC PT "6 Clicks" Mobility  Outcome Measure Help needed turning from your back to your side while in a flat bed without using bedrails?: None Help needed moving from lying on your back to sitting on the side of a flat bed without using bedrails?: None Help needed moving to and from a bed to a chair  (including a wheelchair)?: A Lot Help needed standing up from a chair using your arms (e.g., wheelchair or bedside chair)?: A Little Help needed to walk in hospital room?: Total Help needed climbing 3-5 steps with a railing? : Total 6 Click Score: 15    End of Session Equipment Utilized During Treatment: Gait belt Activity Tolerance: Patient limited by fatigue Patient left: in bed;with call bell/phone within reach;with bed alarm set Nurse Communication: Mobility status PT Visit Diagnosis: Other abnormalities of gait and mobility (R26.89);Muscle weakness (generalized) (M62.81)    Time: 1610-9604 PT Time Calculation (min) (ACUTE ONLY): 26 min   Charges:   PT Evaluation $PT Eval Low Complexity: 1 Low   PT General Charges $$ ACUTE PT VISIT: 1 Visit         Arlyss Gandy, PT, DPT Acute Rehabilitation Office [phone]   Arlyss Gandy 03/19/2023, 9:52 AM

## 2023-03-19 NOTE — Progress Notes (Signed)
[facility] Progress Note   Subjective:   Noted hypoglycemic episode after HD yesterday. Did tolerate 2L UF with HD. BMP pending. He reports feeling well today, ate breakfast. Denies SOB, CP, dizziness, nausea.   Objective Vitals:   03/18/23 2139 03/18/23 2240 03/19/23 0640 03/19/23 0740  BP: 99/65 119/65 (!) 112/59 (!) 140/63  Pulse: 90   79  Resp: 14 18 13    Temp: 98 F (36.7 C) 98 F (36.7 C) 98.5 F (36.9 C) 98.9 F (37.2 C)  TempSrc: Oral   Oral  SpO2: 90% 92% 94% 93%  Weight:      Height:       Physical Exam General: Alert, chronically ill appearing male, NAD Heart: RRR, no murmurs, rubs or gallops Lungs: CTA bilaterally Abdomen: Soft, non-distended, +BS Extremities: No edema b/l lower extremities Dialysis Access: LUE AVG + bruit  Additional Objective Labs: Basic Metabolic Panel: Recent Labs  Lab 03/15/23 1549 04/02/2023 0557 03/22/2023 1623 03/17/23 0526 03/18/23 0346  NA  --  130* 129* 131* 126*  K  --  4.2 4.3 4.4 4.9  CL  --  90* 87* 88* 86*  CO2  --  27  --  26 25  GLUCOSE  --  147* 104* 212* 294*  BUN  --  35* 41* 25* 48*  CREATININE  --  6.79* 8.50* 4.93* 6.64*  CALCIUM  --  7.8*  --  7.6* 7.8*  PHOS 4.1  --   --   --   --    Liver Function Tests: Recent Labs  Lab 03/15/23 1549 04/03/2023 0557  AST  --  17  ALT  --  16  ALKPHOS  --  98  BILITOT  --  0.7  PROT  --  5.8*  ALBUMIN 1.9* 1.8*   No results for input(s): "LIPASE", "AMYLASE" in the last 168 hours. CBC: Recent Labs  Lab 02/20/2023 2213 03/23/2023 0557 04/07/2023 1623 03/17/23 0526 03/18/23 0346  WBC 13.5* 11.1*  --  9.2 15.2*  NEUTROABS 12.1* 8.5*  --   --   --   HGB 8.1* 7.2* 9.9* 7.7* 7.8*  HCT 27.3* 23.5* 29.0* 26.0* 26.6*  MCV 96.8 96.3  --  95.2 95.3  PLT 427* 404*  --  371 416*   Blood Culture    Component Value Date/Time   SDES BLOOD RIGHT HAND 02/20/2023 0746   SPECREQUEST  02/20/2023 0746    BOTTLES DRAWN AEROBIC ONLY Blood Culture adequate volume    CULT  02/20/2023 0746    NO GROWTH 5 DAYS Performed at [facility], [address] [place], [place] [address]    REPTSTATUS 02/25/2023 FINAL 02/20/2023 0746    Cardiac Enzymes: No results for input(s): "CKTOTAL", "CKMB", "CKMBINDEX", "TROPONINI" in the last 168 hours. CBG: Recent Labs  Lab 03/18/23 1548 03/18/23 1603 03/18/23 1621 03/18/23 2137 03/19/23 0728  GLUCAP 37* 34* 78 122* 124*   Iron Studies: No results for input(s): "IRON", "TIBC", "TRANSFERRIN", "FERRITIN" in the last 72 hours. @lablastinr3 @ Studies/Results: No results found. Medications:   amoxicillin-clavulanate  1 tablet Oral Q12H   atorvastatin  80 mg Oral q1800   brimonidine  1 drop Both Eyes Daily   Chlorhexidine Gluconate Cloth  6 each Topical Q0600   Chlorhexidine Gluconate Cloth  6 each Topical Q0600   cinacalcet  180 mg Oral Q M,W,F   darbepoetin (ARANESP) injection - DIALYSIS  150 mcg Subcutaneous Q Tue-1800   dextrose  2 Tube Oral STAT  doxercalciferol  4 mcg Intravenous Q M,W,F-HD   insulin aspart  0-9 Units Subcutaneous TID WC   latanoprost  2 drop Both Eyes BID   metoprolol tartrate  12.5 mg Oral BID   sevelamer carbonate  2,400 mg Oral TID WC    Dialysis Orders: MWF at [facility] B[facility]) 4:15h    500/500     82kg    2K/2Ca    AVG   Heparin 2000 - sensipar 180 three times per week - hectorol 4 mcg IV - mircera 150 mcg q 2 wks, new order  Assessment/Plan: 1. Left foot gangrene - s/p TMA by Dr. Logan Bores.  Per notes little to no perfusion to the foot and will likely require further amputation.   2. ESRD -on HD MWF. Next HD Monday per his regular schedule 3. Anemia of CKD-  Hgb 7.8 today. Aranesp on 10/1. 5. HTN -  BP's wnl here, cont home HTN medication.  6. Volume - no sig vol excess on exam but Na was low yesterday, rechecking today s/p UF with HD yesterday.  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  Continue sensipar and VDRA. Continue renvela 8.. Nutrition - Renal  diet w/fluid restrictions once advanced. DM2 - per pmd  Rogers Blocker, PA-C 03/19/2023, 8:42 AM  [facility] Pager: [phone]

## 2023-03-19 NOTE — Code Documentation (Signed)
Stroke Response Nurse Documentation Code Documentation  [name] is a 72 y.o. male at [facility]  on 10/5 with past medical hx of ESRD, HTN, DM2, CAD. On No antithrombotic. Code stroke was activated by nursing staff.   Patient was LKW at 2155 and now complaining of slurred speech, vomiting and weakness.  Stroke team at the bedside. Patient to CT with team. NIHSS 1, see documentation for details and code stroke times. Patient with dysarthria  on exam. The following imaging was completed:  CT Head. Patient is not a candidate for IV Thrombolytic due to low suspicion of stroke. Patient is not a candidate for IR due to low suspicion of stroke.   Care Plan: Neuro checks q2, MRI.   Bedside handoff with ED RN Almedia Balls.    Rose Fillers  Rapid Response RN

## 2023-03-19 NOTE — Progress Notes (Signed)
PROGRESS NOTE  [name]  JJO:[id number] DOB: [birth year] DOA: 02/17/2023 PCP: [facility]   Brief Narrative: Patient is a 72 year old male with history of peripheral artery disease, coronary artery disease, ESRD on dialysis, diabetes type 2, hypertension who presented with worsening pain and drainage on his left foot.  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 Myra Gianotti on 9/10.  He was discharged home on antibiotics.  Presented back with persistent purulent drainage, foul smell, dark discoloration.  Vascular surgery, nephrology, podiatry consulted here.  S/P left transmetatarsal amputation on April 08, 2023.  PT recommended SNF on discharge.  TOC consulted.  Assessment & Plan:  Principal Problem:   Gangrene of left foot (HCC) Active Problems:   HLD (hyperlipidemia)   Essential (primary) hypertension   ESRD (end stage renal disease) (HCC)   Coronary artery disease involving native coronary artery of native heart without angina pectoris   DM (diabetes mellitus) (HCC)  Left foot gangrene: Vascular surgery, podiatry consulted.  On aspirin, Plavix at home.  Has history of extensive peripheral vascular disease.He was recently hospitalized early September with diabetic foot gangrene on the same foot, vascular surgery was consulted and he had atherectomy, angioplasty of the femoral, popliteal and tibial arteries by Dr. Myra Gianotti on 9/10.  He has been revascularized maximally.He is currently on board spectrum antibiotics.  S/P left transmetatarsal amputation on 04-08-2023.  Podiatry suspecting  that he might need more proximal major amputation but will address as an outpatient.  Currently on Augmentin as per podiatry.  Leukocytosis persist but she is afebrile.  This most like reactive.  Continue to monitor.  Coronary artery  disease: No anginal symptoms.  Continue aspirin, beta-blocker  ESRD: Nephrology consulted and following  for dialysis  Normocytic anemia: Hemoglobin in the 7-8. this is most likely secondary to ESRD.  Continue to monitor.  Transfuse if hemoglobin drops less than 7.  Hyponatremia: Likely from volume overload.  Volume management as per dialysis  Hypertension: Continue metoprolol.  Monitor blood pressure  Hyperlipidemia: Continue statin  Type 2 diabetes: Currently on sliding scale.  Monitor blood sugars.  Blood sugars have fluctuated and sometimes low  Debility/deconditioning: PT recommended SNF on discharge.  TOC consulted         DVT prophylaxis:SCDs Start: 03/15/23 0546     Code Status: Limited: Do not attempt resuscitation (DNR) -DNR-LIMITED -Do Not Intubate/DNI   Family Communication: Called and discussed with daughter in law  Christel on phone on 10/5  Patient status:Inpatient  Patient is from :home  Anticipated discharge ZS:WFUX  Estimated DC date: SNF   Consultants: Podiatry, vascular surgery, nephrology  Procedures: None yet  Antimicrobials:  Anti-infectives (From admission, onward)    Start     Dose/Rate Route Frequency Ordered Stop   03/18/23 1515  amoxicillin-clavulanate (AUGMENTIN) 500-125 MG per tablet 1 tablet        1 tablet Oral Every 12 hours 03/18/23 1206     03/17/23 0915  vancomycin (VANCOCIN) IVPB 1000 mg/200 mL premix        1,000 mg 200 mL/hr over 60 Minutes Intravenous  Once 03/17/23 0815 03/17/23 0937   04/08/23 1200  vancomycin (VANCOCIN) IVPB 1000 mg/200 mL premix  Status:  Discontinued        1,000 mg 200 mL/hr over 60 Minutes Intravenous Every M-W-F (Hemodialysis) 03/15/23 0551 03/18/23 1149   04-08-2023  0000  ceFEPIme (MAXIPIME) 1 g in sodium chloride 0.9 % 100 mL IVPB  Status:  Discontinued        1 g 200 mL/hr over 30 Minutes Intravenous Every 24 hours 03/15/23 0551 03/18/23 1149   03/15/23 0645  vancomycin (VANCOCIN) IVPB 1000  mg/200 mL premix        1,000 mg 200 mL/hr over 60 Minutes Intravenous  Once 03/15/23 0551 03/15/23 0718   03/15/23 0230  vancomycin (VANCOCIN) IVPB 1000 mg/200 mL premix        1,000 mg 200 mL/hr over 60 Minutes Intravenous  Once 03/15/23 0228 03/15/23 0503   03/15/23 0230  ceFEPIme (MAXIPIME) 2 g in sodium chloride 0.9 % 100 mL IVPB        2 g 200 mL/hr over 30 Minutes Intravenous  Once 03/15/23 0228 03/15/23 0343       Subjective: Patient seen and examined the bedside today.2.8 this morning.  He remains hemodynamically stable.  He was just working with physical therapy.  He looks little weaker today.  Denies any chest pain, nausea or vomiting.  His sugars were running low yesterday but better this morning.  He is agreeable to rehab plan  Objective: Vitals:   03/18/23 2139 03/18/23 2240 03/19/23 0640 03/19/23 0740  BP: 99/65 119/65 (!) 112/59 (!) 140/63  Pulse: 90   79  Resp: 14 18 13    Temp: 98 F (36.7 C) 98 F (36.7 C) 98.5 F (36.9 C) 98.9 F (37.2 C)  TempSrc: Oral   Oral  SpO2: 90% 92% 94% 93%  Weight:      Height:        Intake/Output Summary (Last 24 hours) at 03/19/2023 1055 Last data filed at 03/19/2023 0836 Gross per 24 hour  Intake 320 ml  Output 2000 ml  Net -1680 ml   Filed Weights   04/02/2023 2006 03/18/23 0855 03/18/23 1350  Weight: 77.1 kg 77.1 kg 75.1 kg    Examination: General exam: Overall comfortable, not in distress,weak appearing HEENT: PERRL Respiratory system:  no wheezes or crackles  Cardiovascular system: S1 & S2 heard, RRR.  Pansystolic murmur Gastrointestinal system: Abdomen is nondistended, soft and nontender. Central nervous system: Alert and oriented Extremities: Left leg wrapped with dressing, chronic bilateral venous stasis changes,AV fistula  in the left upper extremity Skin: No rashes, no ulcers,no icterus      Data Reviewed: I have personally reviewed following labs and imaging studies  CBC: Recent Labs  Lab  April 08, 2023 2213 03/26/2023 0557 03/28/2023 1623 03/17/23 0526 03/18/23 0346 03/19/23 1004  WBC 13.5* 11.1*  --  9.2 15.2* 17.9*  NEUTROABS 12.1* 8.5*  --   --   --   --   HGB 8.1* 7.2* 9.9* 7.7* 7.8* 8.4*  HCT 27.3* 23.5* 29.0* 26.0* 26.6* 29.4*  MCV 96.8 96.3  --  95.2 95.3 98.7  PLT 427* 404*  --  371 416* 453*   Basic Metabolic Panel: Recent Labs  Lab 04-08-23 2213 03/15/23 1549 04/08/2023 0557 04/12/2023 1623 03/17/23 0526 03/18/23 0346 03/19/23 1004  NA 132*  --  130* 129* 131* 126* 128*  K 4.1  --  4.2 4.3 4.4 4.9 5.0  CL 85*  --  90* 87* 88* 86* 89*  CO2 31  --  27  --  26 25 23   GLUCOSE 214*  --  147* 104* 212* 294* 230*  BUN 26*  --  35* 41* 25* 48* 29*  CREATININE 4.48*  --  6.79* 8.50*  4.93* 6.64* 5.45*  CALCIUM 8.4*  --  7.8*  --  7.6* 7.8* 7.7*  MG  --   --  2.1  --   --   --   --   PHOS  --  4.1  --   --   --   --   --      Recent Results (from the past 240 hour(s))  Surgical pcr screen     Status: None   Collection Time: 03/24/23 12:44 PM   Specimen: Nasal Mucosa; Nasal Swab  Result Value Ref Range Status   MRSA, PCR NEGATIVE NEGATIVE Final   Staphylococcus aureus NEGATIVE NEGATIVE Final    Comment: (NOTE) The Xpert SA Assay (FDA approved for NASAL specimens in patients 79 years of age and older), is one component of a comprehensive surveillance program. It is not intended to diagnose infection nor to guide or monitor treatment. Performed at [facility], [address] [place], [place] [address]      Radiology Studies: No results found.  Scheduled Meds:  amoxicillin-clavulanate  1 tablet Oral Q12H   atorvastatin  80 mg Oral q1800   brimonidine  1 drop Both Eyes Daily   Chlorhexidine Gluconate Cloth  6 each Topical Q0600   Chlorhexidine Gluconate Cloth  6 each Topical Q0600   cinacalcet  180 mg Oral Q M,W,F   darbepoetin (ARANESP) injection - DIALYSIS  150 mcg Subcutaneous Q Tue-1800   dextrose  2 Tube Oral STAT   doxercalciferol  4 mcg  Intravenous Q M,W,F-HD   insulin aspart  0-9 Units Subcutaneous TID WC   latanoprost  2 drop Both Eyes BID   metoprolol tartrate  12.5 mg Oral BID   sevelamer carbonate  2,400 mg Oral TID WC   Continuous Infusions:     LOS: 4 days   Burnadette Pop, MD [facility] P10/10/2022, 10:55 AM

## 2023-03-19 NOTE — Consult Note (Addendum)
Neurology neurology NEUROLOGY CONSULT NOTE   Date of service: March 19, 2023 Patient Name: [name] MRN:  [id number] DOB:  Nov 10, 1950 Chief Complaint: Slurred speech, garbled words, bilateral arm weakness Requesting Provider: Burnadette Pop, MD  History of Present Illness  Hearld Michalk is a 72 y.o. 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 who is admitted for management of left foot gangrene being followed by vascular surgery status post atherectomy and angioplasty by vascular surgery on 02/22/2023 and status post transmetatarsal amputation on 04-06-2023, who was sitting in bed and suddenly started complaining of nausea followed by about a 300 cc emesis of white liquid following which she continued to complain of nausea and the speech became slurred.  The family at bedside noted that he was sitting there with his eyes open and not responsive for a few seconds.  When he started to talk, his speech was slurred and he became back to baseline within a minute.  When asked to raise his arms up during that time, he could not follow commands at that time.  Code stroke was activated due to sudden onset of these deficits. Systolic blood pressure was in the 80s at that time. He was taken for stat CT head which was unremarkable. 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)    Depression    Diabetes mellitus without complication (HCC)    History of kidney stones    Hypertension    Kidney infection    MI (mitral incompetence)    Myocardial infarction (HCC)    Seasonal allergies     Past Surgical History:  Procedure Laterality Date   ABDOMINAL AORTOGRAM W/LOWER EXTREMITY Left 02/22/2023   Procedure: ABDOMINAL AORTOGRAM W/LOWER EXTREMITY;  Surgeon: Nada Libman, MD;  Location: [facility];  Service: Cardiovascular;  Laterality: Left;   ACHILLES TENDON LENGTHENING Left 04-06-23   Procedure: ACHILLES TENDON LENGTHENING;  Surgeon: Felecia Shelling, DPM;  Location: [facility] OR;  Service: Orthopedics/Podiatry;  Laterality: Left;   AMPUTATION Left Apr 06, 2023   Procedure: AMPUTATION MIDFOOT LEFT;  Surgeon: Felecia Shelling, DPM;  Location: [facility] OR;  Service: Orthopedics/Podiatry;  Laterality: Left;   AV FISTULA PLACEMENT Left 01/30/2014   Procedure: INSERTION OF ARTERIOVENOUS (AV) GORE-TEX GRAFT LEFT UPPER ARM;  Surgeon: Sherren Kerns, MD;  Location: [facility] OR;  Service: Vascular;  Laterality: Left;   BACK SURGERY     CARDIAC CATHETERIZATION N/A 04/01/2015   Procedure: Right/Left Heart Cath and Coronary Angiography;  Surgeon: Marykay Lex, MD;  Location: [facility]VASIVE CV LAB;  Service: Cardiovascular;  Laterality: N/A;   COLONOSCOPY N/A 10/12/2016   Procedure: COLONOSCOPY;  Surgeon: Corbin Ade, MD;  Location: [facility];  Service: Endoscopy;  Laterality: N/A;  2:00pm   COLONOSCOPY W/ POLYPECTOMY  CORONARY ANGIOPLASTY WITH STENT PLACEMENT  2008   CORONARY ARTERY BYPASS GRAFT N/A 04/03/2015   Procedure: CORONARY ARTERY BYPASS GRAFTING (CABG) x  4 (LIMA to LAD, SVG to DIAGONAL, SVG to OM1, and SVG to RCA) with EVH from right greater saphenous thigh and partial lower leg vein ;  Surgeon: Kerin Perna, MD;   Location: [facility] OR;  Service: Open Heart Surgery;  Laterality: N/A;   EYE SURGERY Right    lens implant   INSERTION OF DIALYSIS CATHETER Right 01/30/2014   Procedure: INSERTION OF DIALYSIS CATHETER-RIGHT INTERNAL JUGULAR;  Surgeon: Sherren Kerns, MD;  Location: [facility] OR;  Service: Vascular;  Laterality: Right;   IR RADIOLOGIST EVAL & MGMT  12/30/2020   IR RADIOLOGIST EVAL & MGMT  01/08/2021   IR THORACENTESIS ASP PLEURAL SPACE W/IMG GUIDE  03/26/2022   PERIPHERAL VASCULAR ATHERECTOMY  02/22/2023   Procedure: PERIPHERAL VASCULAR ATHERECTOMY;  Surgeon: Nada Libman, MD;  Location: [facility];  Service: Cardiovascular;;  Laser SFA   POLYPECTOMY  10/12/2016   Procedure: POLYPECTOMY;  Surgeon: Corbin Ade, MD;  Location: [facility];  Service: Endoscopy;;  colon   SPINE SURGERY     TEE WITHOUT CARDIOVERSION N/A 04/03/2015   Procedure: TRANSESOPHAGEAL ECHOCARDIOGRAM (TEE);  Surgeon: Kerin Perna, MD;  [facility] OR;  Service: Open Heart Surgery;  Laterality: N/A;    Family History: Family History  Problem Relation Age of Onset   Diabetes Mother    Hypertension Mother    Diabetes Father    Hypertension Father    Diabetes Other     Social History  reports that he has never smoked. He quit smokeless tobacco use about 12 years ago.  His smokeless tobacco use included chew. He reports that he does not drink alcohol and does not use drugs.  Allergies  Allergen Reactions   Shellfish Allergy Hives    Medications   Current Facility-Administered Medications:    0.9 %  sodium chloride infusion, , Intravenous, Continuous, Adhikari, Amrit, MD, Last Rate: 100 mL/hr at 03/19/23 2306, New Bag at 03/19/23 2306   acetaminophen (TYLENOL) tablet 650 mg, 650 mg, Oral, Q6H PRN, 650 mg at 03/18/23 0507 **OR** acetaminophen (TYLENOL) suppository 650 mg, 650 mg, Rectal, Q6H PRN, Zierle-Ghosh, Asia B, DO   amoxicillin-clavulanate (AUGMENTIN) 500-125 MG per tablet 1 tablet, 1 tablet, Oral, Q12H,  Adhikari, Amrit, MD, 1 tablet at 03/19/23 2102   aspirin EC tablet 81 mg, 81 mg, Oral, Daily, Adhikari, Amrit, MD, 81 mg at 03/19/23 1328   atorvastatin (LIPITOR) tablet 80 mg, 80 mg, Oral, q1800, Zierle-Ghosh, Asia B, DO, 80 mg at 03/19/23 1746   brimonidine (ALPHAGAN) 0.2 % ophthalmic solution 1 drop, 1 drop, Both Eyes, Daily, Zierle-Ghosh, Asia B, DO, 1 drop at 03/19/23 0828   Chlorhexidine Gluconate Cloth 2 % PADS 6 each, 6 each, Topical, Q0600, Delano Metz, MD, 6 each at 03/19/23 0835   Chlorhexidine Gluconate Cloth 2 % PADS 6 each, 6 each, Topical, Q0600, Penninger, Lindsay, PA, 6 each at 03/18/23 0507   cinacalcet (SENSIPAR) tablet 180 mg, 180 mg, Oral, Q M,W,F, Delano Metz, MD, 180 mg at 03/18/23 0825   clopidogrel (PLAVIX) tablet 75 mg, 75 mg, Oral, Daily, Adhikari, Amrit, MD, 75 mg at 03/19/23 1328   Darbepoetin Alfa (ARANESP) injection 150 mcg, 150 mcg, Subcutaneous, Q Tue-1800, Delano Metz, MD, 150 mcg at 03/15/23 1727   doxercalciferol (HECTOROL) injection 4 mcg, 4 mcg, Intravenous, Q M,W,F-HD, Delano Metz, MD, 4 mcg  at 03/18/23 1234   insulin aspart (novoLOG) injection 0-9 Units, 0-9 Units, Subcutaneous, TID WC, Adhikari, Amrit, MD, 1 Units at 03/19/23 1746   latanoprost (XALATAN) 0.005 % ophthalmic solution 2 drop, 2 drop, Both Eyes, BID, Zierle-Ghosh, Asia B, DO, 2 drop at 03/19/23 2103   metoprolol tartrate (LOPRESSOR) tablet 12.5 mg, 12.5 mg, Oral, BID, Zierle-Ghosh, Asia B, DO, 12.5 mg at 03/19/23 0829   morphine (PF) 2 MG/ML injection 2 mg, 2 mg, Intravenous, Q2H PRN, Zierle-Ghosh, Asia B, DO   ondansetron (ZOFRAN) tablet 4 mg, 4 mg, Oral, Q6H PRN **OR** ondansetron (ZOFRAN) injection 4 mg, 4 mg, Intravenous, Q6H PRN, Zierle-Ghosh, Asia B, DO, 4 mg at 03/18/23 1524   oxyCODONE (Oxy IR/ROXICODONE) immediate release tablet 5 mg, 5 mg, Oral, Q4H PRN, Zierle-Ghosh, Asia B, DO, 5 mg at 03/19/23 0833   sevelamer carbonate (RENVELA) tablet 2,400 mg, 2,400 mg, Oral, TID  WC, Zierle-Ghosh, Asia B, DO, 2,400 mg at 03/19/23 1746  Vitals   Vitals:   03/19/23 1705 03/19/23 2113 03/19/23 2205 03/19/23 2248  BP: (!) 110/59 (!) 100/50  (!) 100/54  Pulse: 61 60  70  Resp:   18 15  Temp:      TempSrc:      SpO2:      Weight:      Height:        Body mass index is 20.15 kg/m.  Physical Exam   General: Sitting in bed in no acute distress HNT: Normocephalic dermatic Chest:Clear Cardiovascular: Regular rhythm Abdomen nondistended nontender  Neurologic Examination   Awake alert oriented x 3 Mild dysarthria No aphasia Poor visual acuity Cranial nerves: Pupils equal round react light, extract movements intact, visual fields difficult to assess but appear full, face appears symmetric, tongue and palate midline. Motor examination with no drift in bilateral upper extremities-has asterixis on outstretched arms.  There is antigravity strength in bilateral lower extremities as well with mild drift bilaterally. Sensation intact light touch without extinction Coordination exam difficult to assess NIH stroke scale 1a Level of Conscious.: 0 1b LOC Questions: 0 1c LOC Commands: 0 2 Best Gaze: 0 3 Visual: 0 4 Facial Palsy:0  5a Motor Arm - left:0  5b Motor Arm - Right:0  6a Motor Leg - Left: 1 6b Motor Leg - Right: 1 7 Limb Ataxia: 0 8 Sensory: 0 9 Best Language:0  10 Dysarthria: 1 11 Extinct. and Inatten.:0  TOTAL: 3   Labs   CBC:  Recent Labs  Lab 04/11/2023 2213 03/22/2023 0557 03/28/2023 1623 03/18/23 0346 03/19/23 1004  WBC 13.5* 11.1*   < > 15.2* 17.9*  NEUTROABS 12.1* 8.5*  --   --   --   HGB 8.1* 7.2*   < > 7.8* 8.4*  HCT 27.3* 23.5*   < > 26.6* 29.4*  MCV 96.8 96.3   < > 95.3 98.7  PLT 427* 404*   < > 416* 453*   < > = values in this interval not displayed.    Basic Metabolic Panel:  Lab Results  Component Value Date   NA 128 (L) 03/19/2023   K 5.0 03/19/2023   CO2 23 03/19/2023   GLUCOSE 230 (H) 03/19/2023   BUN 29 (H)  03/19/2023   CREATININE 5.45 (H) 03/19/2023   CALCIUM 7.7 (L) 03/19/2023   GFRNONAA 11 (L) 03/19/2023   GFRAA 10 (L) 07/29/2018   Lipid Panel:  Lab Results  Component Value Date   LDLCALC 38 02/23/2023   HgbA1c:  Lab Results  Component Value Date   HGBA1C 8.4 (H) 02/17/2023    CT Head without contrast(Personally reviewed): Aspects 10.  No acute changes.   Impression   Pasco Denaro is a 72 y.o. 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 who is admitted for management of left foot gangrene-evaluated as a code stroke for sudden onset of nausea vomiting and bilateral upper extremity weakness as well as an episode of unresponsiveness that lasted a few seconds. Systolic blood pressure were in the 80s at the time that this symptom happened. I suspect cerebral hypoperfusion.  Examination showed asterixis on outstretched arms.  Other than that nonfocal. Seems like this episode could have been brief alteration of awareness due to hypotension but, based on his risk factors, I would recommend stroke/TIA risk factor workup for a possible posterior circulation stroke versus TIA versus vertebral basilar insufficiency as well.  Impression Hypotension Evaluate for TIA/stroke in the posterior circulation  Recommendations  Every 2 hour neurochecks for the next 8 hours or so. Telemetry MRI brain without contrast MRA head without contrast Carotid Dopplers 2D echo was done on 02/21/2023 and showed an LVEF of 60 to 65%, mild LVH, grade 2 diastolic dysfunction, no evidence of mitral valve regurgitation, RV systolic function severely reduced, moderate calcification of the aortic valve, normal LA size.  No need to repeat. Continue aspirin and  Plavix. A1c 8.4.  Medical management per primary team.  Goal A1c less than 7. LDL is 38-goal is less than 70-this is at goal.  Continue home high intensity statin PT OT speech therapy Blood pressure goal.  Permissive hypertension-do not treat unless systolic is greater than 220.  If systolic goes greater than 220, treat on appearing basis. Stroke team will follow the above studies with you. Plan was relayed to the floor covering provider Dr Arlean Hopping via secure chat.  -- Milon Dikes, MD Neurologist [facility] Pager: [phone]

## 2023-03-20 ENCOUNTER — Inpatient Hospital Stay (HOSPITAL_COMMUNITY): Payer: Medicare Other

## 2023-03-20 DIAGNOSIS — I96 Gangrene, not elsewhere classified: Secondary | ICD-10-CM | POA: Diagnosis not present

## 2023-03-20 LAB — GLUCOSE, CAPILLARY: Glucose-Capillary: 88 mg/dL (ref 70–99)

## 2023-03-20 MED ORDER — SODIUM CHLORIDE 0.9 % IV SOLN
12.5000 mg | Freq: Four times a day (QID) | INTRAVENOUS | Status: DC | PRN
  Administered 2023-03-20: 12.5 mg via INTRAVENOUS
  Filled 2023-03-20: qty 0.5

## 2023-03-20 MED ORDER — SODIUM CHLORIDE 0.9 % IV BOLUS: 500.0000 mL | Freq: Once | INTRAVENOUS | Status: DC

## 2023-03-20 MED ORDER — NALOXONE HCL 0.4 MG/ML IJ SOLN
INTRAMUSCULAR | Status: AC
  Administered 2023-03-20: 0.4 mg
  Filled 2023-03-20: qty 1

## 2023-04-04 ENCOUNTER — Encounter (HOSPITAL_COMMUNITY): Payer: Medicare Other

## 2023-04-11 ENCOUNTER — Encounter: Payer: Medicare Other | Admitting: Surgery

## 2023-04-15 NOTE — Progress Notes (Signed)
Upon arrival to department, patient has an episode where he turned his head to the right, and with wide eyes began gasping for air, not responsive to command or his name.  This last for about 3 minutes during which rapid response was called because the daughter in law and RN from [facility] at bedside both stated that this was different from before in that the episodes did not last over one minute.  Patient was only able to state name after the event, and did not recognize his daughter in law.    Patient became more alert about 10 minutes after the event.  Narcan was given during the evaluation by rapid response RN, patient was then able to state name, DOB, and recognized his daughter in law.  Upon further evaluation, bp had decreased to 79/64, cbg of 88. IV bolus started with new vascular access.  Juice given protocol after swallow eval passed.  Will cont to monitor.

## 2023-04-15 NOTE — Progress Notes (Signed)
Late Note Entry- March 21, 2023  Contacted [facility] (pt's out-pt HD provider) to advise clinic of pt's passing.   Olivia Canter Renal Navigator [phone]

## 2023-04-15 NOTE — Death Summary Note (Signed)
Death Summary  [name] UJW:[id number] DOB: 04/18/1951 DOA: Mar 31, 2023  PCP: [facility]  Admit date: 2023/03/31 Date of Death: 04/08/23 Time of Death: 0400 am Notification:   History of present illness:  Patient is a 72 year old male with history of peripheral artery disease, coronary artery disease, ESRD on dialysis, diabetes type 2, hypertension who presented with worsening pain and drainage on his left foot.  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 Myra Gianotti on 9/10.  He was discharged home on antibiotics.  Presented back with persistent purulent drainage, foul smell, dark discoloration.  Vascular surgery, nephrology, podiatry consulted here.  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 PT/OT evaluation done and he was recommended a skilled nursing facility and he was waiting for the bed availability. In the evening of 03/19/2023, [name]'s overall condition suddenly declined.  He was found to have slurred speech, vomiting, appeared increasingly weak.  Code stroke was called, stroke team evaluated the patient.  CT head did not show any acute findings.  Patient was dysarthric.  He became progressively lethargic and bradycardic.  He became unresponsive to voice or painful stimuli.  On examination, no pulse was present, patient became apneic.  Since he was a DNR,no resuscitation attempted.  Patient pronounced dead at 4 AM.    Final Diagnoses:  1.   Acute ischemic stroke.   The results of significant diagnostics from this hospitalization (including imaging, microbiology, ancillary and laboratory) are listed below for reference.    Significant Diagnostic Studies: CT HEAD CODE STROKE WO CONTRAST  Result Date: 03/19/2023 CLINICAL DATA:  Code stroke.  Dysarthria EXAM: CT HEAD WITHOUT  CONTRAST TECHNIQUE: Contiguous axial images were obtained from the base of the skull through the vertex without intravenous contrast. RADIATION DOSE REDUCTION: This exam was performed according to the departmental dose-optimization program which includes automated exposure control, adjustment of the mA and/or kV according to patient size and/or use of iterative reconstruction technique. COMPARISON:  None Available. FINDINGS: Brain: There is no mass, hemorrhage or extra-axial collection. There is generalized atrophy without lobar predilection. Hypodensity of the white matter is most commonly associated with chronic microvascular disease. Vascular: Atherosclerotic calcification of the internal carotid arteries at the skull base. No abnormal hyperdensity of the major intracranial arteries or dural venous sinuses. Skull: The visualized skull base, calvarium and extracranial soft tissues are normal. Sinuses/Orbits: No fluid levels or advanced mucosal thickening of the visualized paranasal sinuses. No mastoid or middle ear effusion. Normal orbits. ASPECTS [facility] Stroke Program Early CT Score) - Ganglionic level infarction (caudate, lentiform nuclei, internal capsule, insula, M1-M3 cortex): 7 - Supraganglionic infarction (M4-M6 cortex): 3 Total score (0-10 with 10 being normal): 10 IMPRESSION: 1. No acute intracranial abnormality. 2. ASPECTS is 10. These results were communicated to Dr. Milon Dikes at 10:39 pm on 03/19/2023 by text page via the [facility] messaging system. Electronically Signed   By: Deatra Robinson M.D.   On: 03/19/2023 22:39   DG Foot Complete Left  Result Date: 03/21/2023 CLINICAL DATA:  Postoperative gangrene of left foot. EXAM: LEFT FOOT - COMPLETE 3+ VIEW COMPARISON:  03/31/23 FINDINGS: Interval postoperative changes with left foot amputation in the distal metatarsal level. Marginal bony resection appears intact. Soft tissue gas and skin clips likely representing postoperative change. Vascular  calcifications. Degenerative changes in the intertarsal joints. IMPRESSION: Interval left foot amputation at  Death Summary  [name] UJW:[id number] DOB: 04/18/1951 DOA: Mar 31, 2023  PCP: [facility]  Admit date: 2023/03/31 Date of Death: 04/08/23 Time of Death: 0400 am Notification:   History of present illness:  Patient is a 72 year old male with history of peripheral artery disease, coronary artery disease, ESRD on dialysis, diabetes type 2, hypertension who presented with worsening pain and drainage on his left foot.  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 Myra Gianotti on 9/10.  He was discharged home on antibiotics.  Presented back with persistent purulent drainage, foul smell, dark discoloration.  Vascular surgery, nephrology, podiatry consulted here.  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 PT/OT evaluation done and he was recommended a skilled nursing facility and he was waiting for the bed availability. In the evening of 03/19/2023, [name]'s overall condition suddenly declined.  He was found to have slurred speech, vomiting, appeared increasingly weak.  Code stroke was called, stroke team evaluated the patient.  CT head did not show any acute findings.  Patient was dysarthric.  He became progressively lethargic and bradycardic.  He became unresponsive to voice or painful stimuli.  On examination, no pulse was present, patient became apneic.  Since he was a DNR,no resuscitation attempted.  Patient pronounced dead at 4 AM.    Final Diagnoses:  1.   Acute ischemic stroke.   The results of significant diagnostics from this hospitalization (including imaging, microbiology, ancillary and laboratory) are listed below for reference.    Significant Diagnostic Studies: CT HEAD CODE STROKE WO CONTRAST  Result Date: 03/19/2023 CLINICAL DATA:  Code stroke.  Dysarthria EXAM: CT HEAD WITHOUT  CONTRAST TECHNIQUE: Contiguous axial images were obtained from the base of the skull through the vertex without intravenous contrast. RADIATION DOSE REDUCTION: This exam was performed according to the departmental dose-optimization program which includes automated exposure control, adjustment of the mA and/or kV according to patient size and/or use of iterative reconstruction technique. COMPARISON:  None Available. FINDINGS: Brain: There is no mass, hemorrhage or extra-axial collection. There is generalized atrophy without lobar predilection. Hypodensity of the white matter is most commonly associated with chronic microvascular disease. Vascular: Atherosclerotic calcification of the internal carotid arteries at the skull base. No abnormal hyperdensity of the major intracranial arteries or dural venous sinuses. Skull: The visualized skull base, calvarium and extracranial soft tissues are normal. Sinuses/Orbits: No fluid levels or advanced mucosal thickening of the visualized paranasal sinuses. No mastoid or middle ear effusion. Normal orbits. ASPECTS [facility] Stroke Program Early CT Score) - Ganglionic level infarction (caudate, lentiform nuclei, internal capsule, insula, M1-M3 cortex): 7 - Supraganglionic infarction (M4-M6 cortex): 3 Total score (0-10 with 10 being normal): 10 IMPRESSION: 1. No acute intracranial abnormality. 2. ASPECTS is 10. These results were communicated to Dr. Milon Dikes at 10:39 pm on 03/19/2023 by text page via the [facility] messaging system. Electronically Signed   By: Deatra Robinson M.D.   On: 03/19/2023 22:39   DG Foot Complete Left  Result Date: 03/21/2023 CLINICAL DATA:  Postoperative gangrene of left foot. EXAM: LEFT FOOT - COMPLETE 3+ VIEW COMPARISON:  03/31/23 FINDINGS: Interval postoperative changes with left foot amputation in the distal metatarsal level. Marginal bony resection appears intact. Soft tissue gas and skin clips likely representing postoperative change. Vascular  calcifications. Degenerative changes in the intertarsal joints. IMPRESSION: Interval left foot amputation at  309.00 cm/s MR Peak grad: 3.4 mmHg MR Vmax:      92.00 cm/s    SHUNTS MV E velocity: 122.00 cm/s  Systemic VTI:  0.18 m MV A velocity: 108.00 cm/s   Systemic Diam: 2.00 cm MV E/A ratio:  1.13 Carolan Clines Electronically signed by Carolan Clines Signature Date/Time: 02/21/2023/11:29:44 AM    Final    [place] EKG SITE RITE  Result Date: 02/20/2023 If Site Rite image not attached, placement could not be confirmed due to current cardiac rhythm.   Microbiology: Recent Results (from the past 240 hour(s))  Surgical pcr screen     Status: None   Collection Time: 04/11/2023 12:44 PM   Specimen: Nasal Mucosa; Nasal Swab  Result Value Ref Range Status   MRSA, PCR NEGATIVE NEGATIVE Final   Staphylococcus aureus NEGATIVE NEGATIVE Final    Comment: (NOTE) The Xpert SA Assay (FDA approved for NASAL specimens in patients 46 years of age and older), is one component of a comprehensive surveillance program. It is not intended to diagnose infection nor to guide or monitor treatment. Performed at [facility], [address], [place], [place] [address]      Labs: Basic Metabolic Panel: Recent Labs  Lab 03/15/23 1549 2023-04-11 0557 04/11/2023 0557 Apr 11, 2023 1623 03/17/23 0526 03/18/23 0346 03/19/23 1004  NA  --  130*  --  129* 131* 126* 128*  K  --  4.2   < > 4.3 4.4 4.9 5.0  CL  --  90*  --  87* 88* 86* 89*  CO2  --  27  --   --  26 25 23   GLUCOSE  --  147*  --  104* 212* 294* 230*  BUN  --  35*  --  41* 25* 48* 29*  CREATININE  --  6.79*  --  8.50* 4.93* 6.64* 5.45*  CALCIUM  --  7.8*  --   --  7.6* 7.8* 7.7*  MG  --  2.1  --   --   --   --   --   PHOS 4.1  --   --   --   --   --   --    < > = values in this interval not displayed.   Liver Function Tests: Recent Labs  Lab 03/15/23 1549 04-11-2023 0557  AST  --  17  ALT  --  16  ALKPHOS  --  98  BILITOT  --  0.7  PROT  --  5.8*  ALBUMIN 1.9* 1.8*   No results for input(s): "LIPASE", "AMYLASE" in the last 168 hours. No results for input(s): "AMMONIA" in the last 168 hours. CBC: Recent Labs  Lab 11-Apr-2023 0557 04/11/23 1623 03/17/23 0526 03/18/23 0346 03/19/23 1004  WBC 11.1*  --   9.2 15.2* 17.9*  NEUTROABS 8.5*  --   --   --   --   HGB 7.2* 9.9* 7.7* 7.8* 8.4*  HCT 23.5* 29.0* 26.0* 26.6* 29.4*  MCV 96.3  --  95.2 95.3 98.7  PLT 404*  --  371 416* 453*   Cardiac Enzymes: No results for input(s): "CKTOTAL", "CKMB", "CKMBINDEX", "TROPONINI" in the last 168 hours. D-Dimer No results for input(s): "DDIMER" in the last 72 hours. BNP: Invalid input(s): "POCBNP" CBG: Recent Labs  Lab 03/19/23 0728 03/19/23 1127 03/19/23 1700 03/19/23 1946 03/18/2023 0239  GLUCAP 124* 89 144* 117* 88   Anemia work up No results for input(s): "VITAMINB12", "FOLATE", "FERRITIN", "TIBC", "IRON", "RETICCTPCT" in  309.00 cm/s MR Peak grad: 3.4 mmHg MR Vmax:      92.00 cm/s    SHUNTS MV E velocity: 122.00 cm/s  Systemic VTI:  0.18 m MV A velocity: 108.00 cm/s   Systemic Diam: 2.00 cm MV E/A ratio:  1.13 Carolan Clines Electronically signed by Carolan Clines Signature Date/Time: 02/21/2023/11:29:44 AM    Final    [place] EKG SITE RITE  Result Date: 02/20/2023 If Site Rite image not attached, placement could not be confirmed due to current cardiac rhythm.   Microbiology: Recent Results (from the past 240 hour(s))  Surgical pcr screen     Status: None   Collection Time: 04/11/2023 12:44 PM   Specimen: Nasal Mucosa; Nasal Swab  Result Value Ref Range Status   MRSA, PCR NEGATIVE NEGATIVE Final   Staphylococcus aureus NEGATIVE NEGATIVE Final    Comment: (NOTE) The Xpert SA Assay (FDA approved for NASAL specimens in patients 46 years of age and older), is one component of a comprehensive surveillance program. It is not intended to diagnose infection nor to guide or monitor treatment. Performed at [facility], [address], [place], [place] [address]      Labs: Basic Metabolic Panel: Recent Labs  Lab 03/15/23 1549 2023-04-11 0557 04/11/2023 0557 Apr 11, 2023 1623 03/17/23 0526 03/18/23 0346 03/19/23 1004  NA  --  130*  --  129* 131* 126* 128*  K  --  4.2   < > 4.3 4.4 4.9 5.0  CL  --  90*  --  87* 88* 86* 89*  CO2  --  27  --   --  26 25 23   GLUCOSE  --  147*  --  104* 212* 294* 230*  BUN  --  35*  --  41* 25* 48* 29*  CREATININE  --  6.79*  --  8.50* 4.93* 6.64* 5.45*  CALCIUM  --  7.8*  --   --  7.6* 7.8* 7.7*  MG  --  2.1  --   --   --   --   --   PHOS 4.1  --   --   --   --   --   --    < > = values in this interval not displayed.   Liver Function Tests: Recent Labs  Lab 03/15/23 1549 04-11-2023 0557  AST  --  17  ALT  --  16  ALKPHOS  --  98  BILITOT  --  0.7  PROT  --  5.8*  ALBUMIN 1.9* 1.8*   No results for input(s): "LIPASE", "AMYLASE" in the last 168 hours. No results for input(s): "AMMONIA" in the last 168 hours. CBC: Recent Labs  Lab 11-Apr-2023 0557 04/11/23 1623 03/17/23 0526 03/18/23 0346 03/19/23 1004  WBC 11.1*  --   9.2 15.2* 17.9*  NEUTROABS 8.5*  --   --   --   --   HGB 7.2* 9.9* 7.7* 7.8* 8.4*  HCT 23.5* 29.0* 26.0* 26.6* 29.4*  MCV 96.3  --  95.2 95.3 98.7  PLT 404*  --  371 416* 453*   Cardiac Enzymes: No results for input(s): "CKTOTAL", "CKMB", "CKMBINDEX", "TROPONINI" in the last 168 hours. D-Dimer No results for input(s): "DDIMER" in the last 72 hours. BNP: Invalid input(s): "POCBNP" CBG: Recent Labs  Lab 03/19/23 0728 03/19/23 1127 03/19/23 1700 03/19/23 1946 03/18/2023 0239  GLUCAP 124* 89 144* 117* 88   Anemia work up No results for input(s): "VITAMINB12", "FOLATE", "FERRITIN", "TIBC", "IRON", "RETICCTPCT" in  Death Summary  [name] UJW:[id number] DOB: 04/18/1951 DOA: Mar 31, 2023  PCP: [facility]  Admit date: 2023/03/31 Date of Death: 04/08/23 Time of Death: 0400 am Notification:   History of present illness:  Patient is a 72 year old male with history of peripheral artery disease, coronary artery disease, ESRD on dialysis, diabetes type 2, hypertension who presented with worsening pain and drainage on his left foot.  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 Myra Gianotti on 9/10.  He was discharged home on antibiotics.  Presented back with persistent purulent drainage, foul smell, dark discoloration.  Vascular surgery, nephrology, podiatry consulted here.  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 PT/OT evaluation done and he was recommended a skilled nursing facility and he was waiting for the bed availability. In the evening of 03/19/2023, [name]'s overall condition suddenly declined.  He was found to have slurred speech, vomiting, appeared increasingly weak.  Code stroke was called, stroke team evaluated the patient.  CT head did not show any acute findings.  Patient was dysarthric.  He became progressively lethargic and bradycardic.  He became unresponsive to voice or painful stimuli.  On examination, no pulse was present, patient became apneic.  Since he was a DNR,no resuscitation attempted.  Patient pronounced dead at 4 AM.    Final Diagnoses:  1.   Acute ischemic stroke.   The results of significant diagnostics from this hospitalization (including imaging, microbiology, ancillary and laboratory) are listed below for reference.    Significant Diagnostic Studies: CT HEAD CODE STROKE WO CONTRAST  Result Date: 03/19/2023 CLINICAL DATA:  Code stroke.  Dysarthria EXAM: CT HEAD WITHOUT  CONTRAST TECHNIQUE: Contiguous axial images were obtained from the base of the skull through the vertex without intravenous contrast. RADIATION DOSE REDUCTION: This exam was performed according to the departmental dose-optimization program which includes automated exposure control, adjustment of the mA and/or kV according to patient size and/or use of iterative reconstruction technique. COMPARISON:  None Available. FINDINGS: Brain: There is no mass, hemorrhage or extra-axial collection. There is generalized atrophy without lobar predilection. Hypodensity of the white matter is most commonly associated with chronic microvascular disease. Vascular: Atherosclerotic calcification of the internal carotid arteries at the skull base. No abnormal hyperdensity of the major intracranial arteries or dural venous sinuses. Skull: The visualized skull base, calvarium and extracranial soft tissues are normal. Sinuses/Orbits: No fluid levels or advanced mucosal thickening of the visualized paranasal sinuses. No mastoid or middle ear effusion. Normal orbits. ASPECTS [facility] Stroke Program Early CT Score) - Ganglionic level infarction (caudate, lentiform nuclei, internal capsule, insula, M1-M3 cortex): 7 - Supraganglionic infarction (M4-M6 cortex): 3 Total score (0-10 with 10 being normal): 10 IMPRESSION: 1. No acute intracranial abnormality. 2. ASPECTS is 10. These results were communicated to Dr. Milon Dikes at 10:39 pm on 03/19/2023 by text page via the [facility] messaging system. Electronically Signed   By: Deatra Robinson M.D.   On: 03/19/2023 22:39   DG Foot Complete Left  Result Date: 03/21/2023 CLINICAL DATA:  Postoperative gangrene of left foot. EXAM: LEFT FOOT - COMPLETE 3+ VIEW COMPARISON:  03/31/23 FINDINGS: Interval postoperative changes with left foot amputation in the distal metatarsal level. Marginal bony resection appears intact. Soft tissue gas and skin clips likely representing postoperative change. Vascular  calcifications. Degenerative changes in the intertarsal joints. IMPRESSION: Interval left foot amputation at  Death Summary  [name] UJW:[id number] DOB: 04/18/1951 DOA: Mar 31, 2023  PCP: [facility]  Admit date: 2023/03/31 Date of Death: 04/08/23 Time of Death: 0400 am Notification:   History of present illness:  Patient is a 72 year old male with history of peripheral artery disease, coronary artery disease, ESRD on dialysis, diabetes type 2, hypertension who presented with worsening pain and drainage on his left foot.  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 Myra Gianotti on 9/10.  He was discharged home on antibiotics.  Presented back with persistent purulent drainage, foul smell, dark discoloration.  Vascular surgery, nephrology, podiatry consulted here.  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 PT/OT evaluation done and he was recommended a skilled nursing facility and he was waiting for the bed availability. In the evening of 03/19/2023, [name]'s overall condition suddenly declined.  He was found to have slurred speech, vomiting, appeared increasingly weak.  Code stroke was called, stroke team evaluated the patient.  CT head did not show any acute findings.  Patient was dysarthric.  He became progressively lethargic and bradycardic.  He became unresponsive to voice or painful stimuli.  On examination, no pulse was present, patient became apneic.  Since he was a DNR,no resuscitation attempted.  Patient pronounced dead at 4 AM.    Final Diagnoses:  1.   Acute ischemic stroke.   The results of significant diagnostics from this hospitalization (including imaging, microbiology, ancillary and laboratory) are listed below for reference.    Significant Diagnostic Studies: CT HEAD CODE STROKE WO CONTRAST  Result Date: 03/19/2023 CLINICAL DATA:  Code stroke.  Dysarthria EXAM: CT HEAD WITHOUT  CONTRAST TECHNIQUE: Contiguous axial images were obtained from the base of the skull through the vertex without intravenous contrast. RADIATION DOSE REDUCTION: This exam was performed according to the departmental dose-optimization program which includes automated exposure control, adjustment of the mA and/or kV according to patient size and/or use of iterative reconstruction technique. COMPARISON:  None Available. FINDINGS: Brain: There is no mass, hemorrhage or extra-axial collection. There is generalized atrophy without lobar predilection. Hypodensity of the white matter is most commonly associated with chronic microvascular disease. Vascular: Atherosclerotic calcification of the internal carotid arteries at the skull base. No abnormal hyperdensity of the major intracranial arteries or dural venous sinuses. Skull: The visualized skull base, calvarium and extracranial soft tissues are normal. Sinuses/Orbits: No fluid levels or advanced mucosal thickening of the visualized paranasal sinuses. No mastoid or middle ear effusion. Normal orbits. ASPECTS [facility] Stroke Program Early CT Score) - Ganglionic level infarction (caudate, lentiform nuclei, internal capsule, insula, M1-M3 cortex): 7 - Supraganglionic infarction (M4-M6 cortex): 3 Total score (0-10 with 10 being normal): 10 IMPRESSION: 1. No acute intracranial abnormality. 2. ASPECTS is 10. These results were communicated to Dr. Milon Dikes at 10:39 pm on 03/19/2023 by text page via the [facility] messaging system. Electronically Signed   By: Deatra Robinson M.D.   On: 03/19/2023 22:39   DG Foot Complete Left  Result Date: 03/21/2023 CLINICAL DATA:  Postoperative gangrene of left foot. EXAM: LEFT FOOT - COMPLETE 3+ VIEW COMPARISON:  03/31/23 FINDINGS: Interval postoperative changes with left foot amputation in the distal metatarsal level. Marginal bony resection appears intact. Soft tissue gas and skin clips likely representing postoperative change. Vascular  calcifications. Degenerative changes in the intertarsal joints. IMPRESSION: Interval left foot amputation at  Death Summary  [name] UJW:[id number] DOB: 04/18/1951 DOA: Mar 31, 2023  PCP: [facility]  Admit date: 2023/03/31 Date of Death: 04/08/23 Time of Death: 0400 am Notification:   History of present illness:  Patient is a 72 year old male with history of peripheral artery disease, coronary artery disease, ESRD on dialysis, diabetes type 2, hypertension who presented with worsening pain and drainage on his left foot.  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 Myra Gianotti on 9/10.  He was discharged home on antibiotics.  Presented back with persistent purulent drainage, foul smell, dark discoloration.  Vascular surgery, nephrology, podiatry consulted here.  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 PT/OT evaluation done and he was recommended a skilled nursing facility and he was waiting for the bed availability. In the evening of 03/19/2023, [name]'s overall condition suddenly declined.  He was found to have slurred speech, vomiting, appeared increasingly weak.  Code stroke was called, stroke team evaluated the patient.  CT head did not show any acute findings.  Patient was dysarthric.  He became progressively lethargic and bradycardic.  He became unresponsive to voice or painful stimuli.  On examination, no pulse was present, patient became apneic.  Since he was a DNR,no resuscitation attempted.  Patient pronounced dead at 4 AM.    Final Diagnoses:  1.   Acute ischemic stroke.   The results of significant diagnostics from this hospitalization (including imaging, microbiology, ancillary and laboratory) are listed below for reference.    Significant Diagnostic Studies: CT HEAD CODE STROKE WO CONTRAST  Result Date: 03/19/2023 CLINICAL DATA:  Code stroke.  Dysarthria EXAM: CT HEAD WITHOUT  CONTRAST TECHNIQUE: Contiguous axial images were obtained from the base of the skull through the vertex without intravenous contrast. RADIATION DOSE REDUCTION: This exam was performed according to the departmental dose-optimization program which includes automated exposure control, adjustment of the mA and/or kV according to patient size and/or use of iterative reconstruction technique. COMPARISON:  None Available. FINDINGS: Brain: There is no mass, hemorrhage or extra-axial collection. There is generalized atrophy without lobar predilection. Hypodensity of the white matter is most commonly associated with chronic microvascular disease. Vascular: Atherosclerotic calcification of the internal carotid arteries at the skull base. No abnormal hyperdensity of the major intracranial arteries or dural venous sinuses. Skull: The visualized skull base, calvarium and extracranial soft tissues are normal. Sinuses/Orbits: No fluid levels or advanced mucosal thickening of the visualized paranasal sinuses. No mastoid or middle ear effusion. Normal orbits. ASPECTS [facility] Stroke Program Early CT Score) - Ganglionic level infarction (caudate, lentiform nuclei, internal capsule, insula, M1-M3 cortex): 7 - Supraganglionic infarction (M4-M6 cortex): 3 Total score (0-10 with 10 being normal): 10 IMPRESSION: 1. No acute intracranial abnormality. 2. ASPECTS is 10. These results were communicated to Dr. Milon Dikes at 10:39 pm on 03/19/2023 by text page via the [facility] messaging system. Electronically Signed   By: Deatra Robinson M.D.   On: 03/19/2023 22:39   DG Foot Complete Left  Result Date: 03/21/2023 CLINICAL DATA:  Postoperative gangrene of left foot. EXAM: LEFT FOOT - COMPLETE 3+ VIEW COMPARISON:  03/31/23 FINDINGS: Interval postoperative changes with left foot amputation in the distal metatarsal level. Marginal bony resection appears intact. Soft tissue gas and skin clips likely representing postoperative change. Vascular  calcifications. Degenerative changes in the intertarsal joints. IMPRESSION: Interval left foot amputation at  Death Summary  [name] UJW:[id number] DOB: 04/18/1951 DOA: Mar 31, 2023  PCP: [facility]  Admit date: 2023/03/31 Date of Death: 04/08/23 Time of Death: 0400 am Notification:   History of present illness:  Patient is a 72 year old male with history of peripheral artery disease, coronary artery disease, ESRD on dialysis, diabetes type 2, hypertension who presented with worsening pain and drainage on his left foot.  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 Myra Gianotti on 9/10.  He was discharged home on antibiotics.  Presented back with persistent purulent drainage, foul smell, dark discoloration.  Vascular surgery, nephrology, podiatry consulted here.  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 PT/OT evaluation done and he was recommended a skilled nursing facility and he was waiting for the bed availability. In the evening of 03/19/2023, [name]'s overall condition suddenly declined.  He was found to have slurred speech, vomiting, appeared increasingly weak.  Code stroke was called, stroke team evaluated the patient.  CT head did not show any acute findings.  Patient was dysarthric.  He became progressively lethargic and bradycardic.  He became unresponsive to voice or painful stimuli.  On examination, no pulse was present, patient became apneic.  Since he was a DNR,no resuscitation attempted.  Patient pronounced dead at 4 AM.    Final Diagnoses:  1.   Acute ischemic stroke.   The results of significant diagnostics from this hospitalization (including imaging, microbiology, ancillary and laboratory) are listed below for reference.    Significant Diagnostic Studies: CT HEAD CODE STROKE WO CONTRAST  Result Date: 03/19/2023 CLINICAL DATA:  Code stroke.  Dysarthria EXAM: CT HEAD WITHOUT  CONTRAST TECHNIQUE: Contiguous axial images were obtained from the base of the skull through the vertex without intravenous contrast. RADIATION DOSE REDUCTION: This exam was performed according to the departmental dose-optimization program which includes automated exposure control, adjustment of the mA and/or kV according to patient size and/or use of iterative reconstruction technique. COMPARISON:  None Available. FINDINGS: Brain: There is no mass, hemorrhage or extra-axial collection. There is generalized atrophy without lobar predilection. Hypodensity of the white matter is most commonly associated with chronic microvascular disease. Vascular: Atherosclerotic calcification of the internal carotid arteries at the skull base. No abnormal hyperdensity of the major intracranial arteries or dural venous sinuses. Skull: The visualized skull base, calvarium and extracranial soft tissues are normal. Sinuses/Orbits: No fluid levels or advanced mucosal thickening of the visualized paranasal sinuses. No mastoid or middle ear effusion. Normal orbits. ASPECTS [facility] Stroke Program Early CT Score) - Ganglionic level infarction (caudate, lentiform nuclei, internal capsule, insula, M1-M3 cortex): 7 - Supraganglionic infarction (M4-M6 cortex): 3 Total score (0-10 with 10 being normal): 10 IMPRESSION: 1. No acute intracranial abnormality. 2. ASPECTS is 10. These results were communicated to Dr. Milon Dikes at 10:39 pm on 03/19/2023 by text page via the [facility] messaging system. Electronically Signed   By: Deatra Robinson M.D.   On: 03/19/2023 22:39   DG Foot Complete Left  Result Date: 03/21/2023 CLINICAL DATA:  Postoperative gangrene of left foot. EXAM: LEFT FOOT - COMPLETE 3+ VIEW COMPARISON:  03/31/23 FINDINGS: Interval postoperative changes with left foot amputation in the distal metatarsal level. Marginal bony resection appears intact. Soft tissue gas and skin clips likely representing postoperative change. Vascular  calcifications. Degenerative changes in the intertarsal joints. IMPRESSION: Interval left foot amputation at  Death Summary  [name] UJW:[id number] DOB: 04/18/1951 DOA: Mar 31, 2023  PCP: [facility]  Admit date: 2023/03/31 Date of Death: 04/08/23 Time of Death: 0400 am Notification:   History of present illness:  Patient is a 72 year old male with history of peripheral artery disease, coronary artery disease, ESRD on dialysis, diabetes type 2, hypertension who presented with worsening pain and drainage on his left foot.  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 Myra Gianotti on 9/10.  He was discharged home on antibiotics.  Presented back with persistent purulent drainage, foul smell, dark discoloration.  Vascular surgery, nephrology, podiatry consulted here.  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 PT/OT evaluation done and he was recommended a skilled nursing facility and he was waiting for the bed availability. In the evening of 03/19/2023, [name]'s overall condition suddenly declined.  He was found to have slurred speech, vomiting, appeared increasingly weak.  Code stroke was called, stroke team evaluated the patient.  CT head did not show any acute findings.  Patient was dysarthric.  He became progressively lethargic and bradycardic.  He became unresponsive to voice or painful stimuli.  On examination, no pulse was present, patient became apneic.  Since he was a DNR,no resuscitation attempted.  Patient pronounced dead at 4 AM.    Final Diagnoses:  1.   Acute ischemic stroke.   The results of significant diagnostics from this hospitalization (including imaging, microbiology, ancillary and laboratory) are listed below for reference.    Significant Diagnostic Studies: CT HEAD CODE STROKE WO CONTRAST  Result Date: 03/19/2023 CLINICAL DATA:  Code stroke.  Dysarthria EXAM: CT HEAD WITHOUT  CONTRAST TECHNIQUE: Contiguous axial images were obtained from the base of the skull through the vertex without intravenous contrast. RADIATION DOSE REDUCTION: This exam was performed according to the departmental dose-optimization program which includes automated exposure control, adjustment of the mA and/or kV according to patient size and/or use of iterative reconstruction technique. COMPARISON:  None Available. FINDINGS: Brain: There is no mass, hemorrhage or extra-axial collection. There is generalized atrophy without lobar predilection. Hypodensity of the white matter is most commonly associated with chronic microvascular disease. Vascular: Atherosclerotic calcification of the internal carotid arteries at the skull base. No abnormal hyperdensity of the major intracranial arteries or dural venous sinuses. Skull: The visualized skull base, calvarium and extracranial soft tissues are normal. Sinuses/Orbits: No fluid levels or advanced mucosal thickening of the visualized paranasal sinuses. No mastoid or middle ear effusion. Normal orbits. ASPECTS [facility] Stroke Program Early CT Score) - Ganglionic level infarction (caudate, lentiform nuclei, internal capsule, insula, M1-M3 cortex): 7 - Supraganglionic infarction (M4-M6 cortex): 3 Total score (0-10 with 10 being normal): 10 IMPRESSION: 1. No acute intracranial abnormality. 2. ASPECTS is 10. These results were communicated to Dr. Milon Dikes at 10:39 pm on 03/19/2023 by text page via the [facility] messaging system. Electronically Signed   By: Deatra Robinson M.D.   On: 03/19/2023 22:39   DG Foot Complete Left  Result Date: 03/21/2023 CLINICAL DATA:  Postoperative gangrene of left foot. EXAM: LEFT FOOT - COMPLETE 3+ VIEW COMPARISON:  03/31/23 FINDINGS: Interval postoperative changes with left foot amputation in the distal metatarsal level. Marginal bony resection appears intact. Soft tissue gas and skin clips likely representing postoperative change. Vascular  calcifications. Degenerative changes in the intertarsal joints. IMPRESSION: Interval left foot amputation at  309.00 cm/s MR Peak grad: 3.4 mmHg MR Vmax:      92.00 cm/s    SHUNTS MV E velocity: 122.00 cm/s  Systemic VTI:  0.18 m MV A velocity: 108.00 cm/s   Systemic Diam: 2.00 cm MV E/A ratio:  1.13 Carolan Clines Electronically signed by Carolan Clines Signature Date/Time: 02/21/2023/11:29:44 AM    Final    [place] EKG SITE RITE  Result Date: 02/20/2023 If Site Rite image not attached, placement could not be confirmed due to current cardiac rhythm.   Microbiology: Recent Results (from the past 240 hour(s))  Surgical pcr screen     Status: None   Collection Time: 04/11/2023 12:44 PM   Specimen: Nasal Mucosa; Nasal Swab  Result Value Ref Range Status   MRSA, PCR NEGATIVE NEGATIVE Final   Staphylococcus aureus NEGATIVE NEGATIVE Final    Comment: (NOTE) The Xpert SA Assay (FDA approved for NASAL specimens in patients 46 years of age and older), is one component of a comprehensive surveillance program. It is not intended to diagnose infection nor to guide or monitor treatment. Performed at [facility], [address], [place], [place] [address]      Labs: Basic Metabolic Panel: Recent Labs  Lab 03/15/23 1549 2023-04-11 0557 04/11/2023 0557 Apr 11, 2023 1623 03/17/23 0526 03/18/23 0346 03/19/23 1004  NA  --  130*  --  129* 131* 126* 128*  K  --  4.2   < > 4.3 4.4 4.9 5.0  CL  --  90*  --  87* 88* 86* 89*  CO2  --  27  --   --  26 25 23   GLUCOSE  --  147*  --  104* 212* 294* 230*  BUN  --  35*  --  41* 25* 48* 29*  CREATININE  --  6.79*  --  8.50* 4.93* 6.64* 5.45*  CALCIUM  --  7.8*  --   --  7.6* 7.8* 7.7*  MG  --  2.1  --   --   --   --   --   PHOS 4.1  --   --   --   --   --   --    < > = values in this interval not displayed.   Liver Function Tests: Recent Labs  Lab 03/15/23 1549 04-11-2023 0557  AST  --  17  ALT  --  16  ALKPHOS  --  98  BILITOT  --  0.7  PROT  --  5.8*  ALBUMIN 1.9* 1.8*   No results for input(s): "LIPASE", "AMYLASE" in the last 168 hours. No results for input(s): "AMMONIA" in the last 168 hours. CBC: Recent Labs  Lab 11-Apr-2023 0557 04/11/23 1623 03/17/23 0526 03/18/23 0346 03/19/23 1004  WBC 11.1*  --   9.2 15.2* 17.9*  NEUTROABS 8.5*  --   --   --   --   HGB 7.2* 9.9* 7.7* 7.8* 8.4*  HCT 23.5* 29.0* 26.0* 26.6* 29.4*  MCV 96.3  --  95.2 95.3 98.7  PLT 404*  --  371 416* 453*   Cardiac Enzymes: No results for input(s): "CKTOTAL", "CKMB", "CKMBINDEX", "TROPONINI" in the last 168 hours. D-Dimer No results for input(s): "DDIMER" in the last 72 hours. BNP: Invalid input(s): "POCBNP" CBG: Recent Labs  Lab 03/19/23 0728 03/19/23 1127 03/19/23 1700 03/19/23 1946 03/18/2023 0239  GLUCAP 124* 89 144* 117* 88   Anemia work up No results for input(s): "VITAMINB12", "FOLATE", "FERRITIN", "TIBC", "IRON", "RETICCTPCT" in

## 2023-04-15 NOTE — Progress Notes (Signed)
I was called to the bedside to reassess [name]'s neuro status. He was more lethargic than earlier and hard to keep awake. Slow mumbled responses. CBG 88. Daughter at bedside. Narcan 0.4 mg IV given. Pt woke up and started answering questions and following commands.

## 2023-04-15 NOTE — Progress Notes (Signed)
Patient on cardiac monitor in room, alerted by central telemetry and alarms that pts HR was 33, pt not responsive to voice or painful stimuli.  No pulse present, no respirations, code status reviewed as DNR limited. No heart beat on auscultation.  Dr Arlean Hopping made aware and patient pronounced at 0400.  [name], son, made aware.

## 2023-04-15 NOTE — Progress Notes (Signed)
TR[facility] night cross cover note:   I was notified by RN that this patient, who was DNR/DNI, has passed, and I was asked to pronounce.   On my exam, auscultation yielded no heart sounds and no spontaneous respirations were noted. There was no response to painful stimuli, and pupils were noted to be fixed and dilated bilaterally.   Time of death noted to 0400 on 04/09/2023.      Newton Pigg, DO Hospitalist

## 2023-04-15 NOTE — Progress Notes (Signed)
Bedside swallow eval completed and passed.

## 2023-04-15 DEATH — deceased

## 2023-07-19 ENCOUNTER — Ambulatory Visit: Payer: Medicare Other | Admitting: Cardiovascular Disease
